# Patient Record
Sex: Female | Born: 1950
Health system: Southern US, Community
[De-identification: ages and names within clinical notes are randomized; demographics above are authoritative.]

## PROBLEM LIST (undated history)

## (undated) DIAGNOSIS — C801 Malignant (primary) neoplasm, unspecified: Secondary | ICD-10-CM

## (undated) DIAGNOSIS — E039 Hypothyroidism, unspecified: Secondary | ICD-10-CM

## (undated) DIAGNOSIS — I10 Essential (primary) hypertension: Secondary | ICD-10-CM

## (undated) DIAGNOSIS — E785 Hyperlipidemia, unspecified: Secondary | ICD-10-CM

## (undated) DIAGNOSIS — H269 Unspecified cataract: Secondary | ICD-10-CM

## (undated) DIAGNOSIS — C3490 Malignant neoplasm of unspecified part of unspecified bronchus or lung: Secondary | ICD-10-CM

## (undated) DIAGNOSIS — E119 Type 2 diabetes mellitus without complications: Secondary | ICD-10-CM

## (undated) HISTORY — DX: Malignant neoplasm of unspecified part of unspecified bronchus or lung: C34.90

## (undated) HISTORY — DX: Hyperlipidemia, unspecified: E78.5

## (undated) HISTORY — DX: Essential (primary) hypertension: I10

## (undated) HISTORY — DX: Unspecified cataract: H26.9

## (undated) HISTORY — PX: ABDOMINAL HYSTERECTOMY: SHX81

## (undated) MED FILL — Dexamethasone Sodium Phosphate Inj 100 MG/10ML: INTRAMUSCULAR | Qty: 1 | Status: AC

---

## 2003-11-17 LAB — HM DEXA SCAN

## 2005-01-28 ENCOUNTER — Ambulatory Visit: Payer: Self-pay | Admitting: Family Medicine

## 2006-12-12 ENCOUNTER — Ambulatory Visit: Payer: Self-pay | Admitting: Family Medicine

## 2014-08-28 ENCOUNTER — Ambulatory Visit: Payer: Self-pay | Admitting: Family Medicine

## 2014-08-28 LAB — HM MAMMOGRAPHY

## 2015-02-06 ENCOUNTER — Ambulatory Visit: Payer: Self-pay | Admitting: Gastroenterology

## 2015-02-06 LAB — HM COLONOSCOPY: HM Colonoscopy: 4

## 2015-03-09 LAB — TSH: TSH: 0.87 u[IU]/mL (ref 0.41–5.90)

## 2015-03-09 LAB — BASIC METABOLIC PANEL
BUN: 11 mg/dL (ref 4–21)
Creatinine: 0.7 mg/dL (ref 0.5–1.1)
GLUCOSE: 125 mg/dL
Potassium: 4 mmol/L (ref 3.4–5.3)
SODIUM: 141 mmol/L (ref 137–147)

## 2015-03-09 LAB — LIPID PANEL
Cholesterol: 192 mg/dL (ref 0–200)
HDL: 57 mg/dL (ref 35–70)
LDL Cholesterol: 117 mg/dL
Triglycerides: 90 mg/dL (ref 40–160)

## 2015-03-09 LAB — HEPATIC FUNCTION PANEL
ALK PHOS: 57 U/L (ref 25–125)
ALT: 16 U/L (ref 7–35)
AST: 14 U/L (ref 13–35)
BILIRUBIN, TOTAL: 0.5 mg/dL

## 2015-03-12 ENCOUNTER — Ambulatory Visit
Admit: 2015-03-12 | Disposition: A | Discharge status: Home / Self Care | Payer: Self-pay | Attending: Family Medicine | Admitting: Family Medicine

## 2015-04-17 ENCOUNTER — Other Ambulatory Visit: Payer: Self-pay | Admitting: Family Medicine

## 2015-04-17 DIAGNOSIS — I10 Essential (primary) hypertension: Secondary | ICD-10-CM

## 2015-06-15 ENCOUNTER — Ambulatory Visit: Payer: Self-pay | Admitting: Family Medicine

## 2015-08-10 ENCOUNTER — Other Ambulatory Visit: Payer: Self-pay | Admitting: Family Medicine

## 2015-08-10 DIAGNOSIS — J309 Allergic rhinitis, unspecified: Secondary | ICD-10-CM

## 2015-12-10 ENCOUNTER — Other Ambulatory Visit: Payer: Self-pay | Admitting: Family Medicine

## 2015-12-10 DIAGNOSIS — I10 Essential (primary) hypertension: Secondary | ICD-10-CM

## 2015-12-10 DIAGNOSIS — IMO0002 Reserved for concepts with insufficient information to code with codable children: Secondary | ICD-10-CM | POA: Insufficient documentation

## 2015-12-10 MED ORDER — TRIAMTERENE-HCTZ 37.5-25 MG PO CAPS: ORAL_CAPSULE | ORAL | Status: DC

## 2015-12-10 NOTE — Telephone Encounter (Signed)
Pt contacted office for refill request on the following medications:  triamterene-hydrochlorothiazide (DYAZIDE) 37.5-25 MG per capsule.  [facility]  [phone]

## 2015-12-10 NOTE — Telephone Encounter (Signed)
Last ov 03/06/15.  sd

## 2016-01-08 ENCOUNTER — Other Ambulatory Visit: Payer: Self-pay | Admitting: Family Medicine

## 2016-01-08 DIAGNOSIS — E782 Mixed hyperlipidemia: Secondary | ICD-10-CM | POA: Diagnosis not present

## 2016-01-08 DIAGNOSIS — E7800 Pure hypercholesterolemia, unspecified: Secondary | ICD-10-CM | POA: Insufficient documentation

## 2016-01-08 DIAGNOSIS — I10 Essential (primary) hypertension: Secondary | ICD-10-CM | POA: Diagnosis not present

## 2016-01-08 DIAGNOSIS — Z6836 Body mass index (BMI) 36.0-36.9, adult: Secondary | ICD-10-CM | POA: Diagnosis not present

## 2016-01-08 DIAGNOSIS — Z0000 Encounter for general adult medical examination without abnormal findings: Secondary | ICD-10-CM | POA: Diagnosis not present

## 2016-01-08 DIAGNOSIS — E1169 Type 2 diabetes mellitus with other specified complication: Secondary | ICD-10-CM | POA: Diagnosis not present

## 2016-01-08 MED ORDER — SIMVASTATIN 20 MG PO TABS
20.0000 mg | ORAL_TABLET | Freq: Every day | ORAL | Status: DC
Start: 2016-01-08 — End: 2016-05-06

## 2016-01-08 NOTE — Telephone Encounter (Signed)
Pt contacted office for refill request on the following medications: Simvastatin 20 mg to [employer] [facility]. Pt stated that she doesn't want to use her mail order any more. Thanks TNP

## 2016-01-08 NOTE — Telephone Encounter (Signed)
Lipids checked 03/09/2015. Renaldo Fiddler, CMA

## 2016-01-20 DIAGNOSIS — E669 Obesity, unspecified: Secondary | ICD-10-CM | POA: Insufficient documentation

## 2016-01-20 DIAGNOSIS — E119 Type 2 diabetes mellitus without complications: Secondary | ICD-10-CM | POA: Insufficient documentation

## 2016-01-20 DIAGNOSIS — K5790 Diverticulosis of intestine, part unspecified, without perforation or abscess without bleeding: Secondary | ICD-10-CM | POA: Insufficient documentation

## 2016-01-20 DIAGNOSIS — E010 Iodine-deficiency related diffuse (endemic) goiter: Secondary | ICD-10-CM | POA: Insufficient documentation

## 2016-02-23 ENCOUNTER — Ambulatory Visit (INDEPENDENT_AMBULATORY_CARE_PROVIDER_SITE_OTHER): Payer: Commercial Managed Care - HMO | Admitting: Family Medicine

## 2016-02-23 ENCOUNTER — Encounter: Payer: Self-pay | Admitting: Family Medicine

## 2016-02-23 VITALS — BP 126/70 | HR 77 | Temp 98.1°F | Resp 16 | Ht 65.0 in | Wt 229.0 lb

## 2016-02-23 DIAGNOSIS — Z0000 Encounter for general adult medical examination without abnormal findings: Secondary | ICD-10-CM | POA: Diagnosis not present

## 2016-02-23 DIAGNOSIS — Z1239 Encounter for other screening for malignant neoplasm of breast: Secondary | ICD-10-CM | POA: Diagnosis not present

## 2016-02-23 DIAGNOSIS — Z23 Encounter for immunization: Secondary | ICD-10-CM

## 2016-02-23 NOTE — Patient Instructions (Signed)
Please call the [facility] at [facility] to schedule this at [phone]   

## 2016-02-23 NOTE — Progress Notes (Signed)
Patient ID: Doris Johnson, female   DOB: 1951-08-01, 65 y.o.   MRN: [id number]       Patient: Doris Johnson, Female    DOB: July 20, 1951, 4 y.o.   MRN: [id number] Visit Date: 02/23/2016  Today's Provider: Margarita Rana, MD   Chief Complaint  Patient presents with  . Medicare Wellness   Subjective:    Annual wellness visit Doris Johnson is a 65 y.o. female. She feels well. She reports exercising none. She reports she is sleeping well.  Is going to look into a living will.   Here for her [facility] evaluation.    03/06/15 CPE 08/28/14 Mammogram-BI-RADS 1 02/06/15 Colonoscopy-polyp, recheck in 5 years -----------------------------------------------------------   Review of Systems  Constitutional: Negative.   HENT: Negative.   Eyes: Negative.   Respiratory: Negative.   Cardiovascular: Negative.   Gastrointestinal: Negative.   Endocrine: Negative.   Genitourinary: Positive for vaginal discharge.  Musculoskeletal: Negative.   Allergic/Immunologic: Negative.   Neurological: Negative.   Hematological: Negative.   Psychiatric/Behavioral: Negative.     Social History   Social History  . Marital Status: Single    Spouse Name: N/A  . Number of Children: N/A  . Years of Education: N/A   Occupational History  . Not on file.   Social History Main Topics  . Smoking status: Never Smoker   . Smokeless tobacco: Never Used  . Alcohol Use: No     Comment: occasional  . Drug Use: No  . Sexual Activity: Not on file   Other Topics Concern  . Not on file   Social History Narrative    Past Medical History  Diagnosis Date  . Hypertension   . Hyperlipidemia      Patient Active Problem List   Diagnosis Date Noted  . Diabetes mellitus, type 2 ([employer]) 01/20/2016  . DD (diverticular disease) 01/20/2016  . Big thyroid 01/20/2016  . Adiposity 01/20/2016  . Hypercholesteremia 01/08/2016  . Adult BMI 30+ 12/10/2015  . BP (high blood pressure) 12/10/2015  .  Allergic rhinitis 08/10/2015    Past Surgical History  Procedure Laterality Date  . Abdominal hysterectomy      Her family history includes Cancer in her mother; Heart disease in her brother and father; Hyperlipidemia in her brother; Hypertension in her mother.    Previous Medications   ASPIRIN 81 MG TABLET    Take 81 mg by mouth daily.    CETIRIZINE (ZYRTEC) 10 MG TABLET    TAKE 1 TABLET BY MOUTH EVERY DAY   FLUTICASONE (FLONASE) 50 MCG/ACT NASAL SPRAY    USE 2 SPRAYS IN EACH NOSTRIL EVERY DAY   LISINOPRIL (PRINIVIL,ZESTRIL) 20 MG TABLET    Take 20 mg by mouth daily.    METFORMIN (GLUCOPHAGE) 500 MG TABLET    Take 500 mg by mouth daily with breakfast.    SIMVASTATIN (ZOCOR) 20 MG TABLET    Take 1 tablet (20 mg total) by mouth at bedtime.   TRIAMTERENE-HYDROCHLOROTHIAZIDE (DYAZIDE) 37.5-25 MG CAPSULE    TAKE 1 CAPSULE CAPSULE BY MOUTH DAILY    Patient Care Team: Margarita Rana, MD as PCP - General (Family Medicine)     Objective:   Vitals: BP 126/70 mmHg  Pulse 77  Temp(Src) 98.1 F (36.7 C) (Oral)  Resp 16  Ht '5\' 5"'$  (1.651 m)  Wt 229 lb (103.874 kg)  BMI 38.11 kg/m2  SpO2 97%  Physical Exam  Constitutional: She is oriented to person, place, and time. She appears  well-developed and well-nourished.  Neck: Trachea normal. Carotid bruit is not present. No thyroid mass present.  Cardiovascular: Normal rate, regular rhythm and normal heart sounds.   Pulmonary/Chest: Effort normal and breath sounds normal.  Abdominal: There is no hepatosplenomegaly.  Neurological: She is alert and oriented to person, place, and time. She has normal strength. No cranial nerve deficit.  Skin: Skin is intact.  Psychiatric: She has a normal mood and affect. Her speech is normal and behavior is normal. Judgment and thought content normal. Cognition and memory are normal.    Activities of Daily Living In your present state of health, do you have any difficulty performing the following activities:  02/23/2016  Hearing? N  Vision? N  Difficulty concentrating or making decisions? N  Walking or climbing stairs? N  Dressing or bathing? N  Doing errands, shopping? N    Fall Risk Assessment Fall Risk  02/23/2016  Falls in the past year? No     Depression Screen PHQ 2/9 Scores 02/23/2016  PHQ - 2 Score 0    Cognitive Testing - 6-CIT  Correct? Score   What year is it? yes 0 0 or 4  What month is it? yes 0 0 or 3  Memorize:    Doris Johnson,  41,  [address],  [place],      What time is it? (within 1 hour) yes 0 0 or 3  Count backwards from 20 yes 0 0, 2, or 4  Name the months of the year yes 0 0, 2, or 4  Repeat name & address above no 2 0, 2, 4, 6, 8, or 10       TOTAL SCORE  2/28   Interpretation:  Normal  Normal (0-7) Abnormal (8-28)       Assessment & Plan:     Annual Wellness Visit  Reviewed patient's Family Medical History Reviewed and updated list of patient's medical providers Assessment of cognitive impairment was done Assessed patient's functional ability Established a written schedule for health screening [place] Completed and Reviewed  Exercise Activities and Dietary recommendations Goals    . Exercise 150 minutes per week (moderate activity)       Immunization History  Administered Date(s) Administered  . Pneumococcal Conjugate-13 09/25/2013  . Pneumococcal Polysaccharide-23 02/23/2016  . Tdap 03/06/2015       1. Medicare annual wellness visit, initial As above. Stable. Patient advised to continue eating healthy and exercise daily. - EKG 12-Lead  2. Breast cancer screening - MM DIGITAL SCREENING BILATERAL; Future  3. Need for pneumococcal vaccination - Pneumococcal polysaccharide vaccine 23-valent greater than or equal to 2yo subcutaneous/IM     Patient seen and examined by Dr. Jerrell Belfast, and note scribed by Philbert Riser. Dimas, CMA.  I have reviewed the document for accuracy and completeness and I agree  with above. Jerrell Belfast, MD   Margarita Rana, MD   ------------------------------------------------------------------------------------------------------------

## 2016-02-29 ENCOUNTER — Encounter: Payer: Self-pay | Admitting: Family Medicine

## 2016-03-10 DIAGNOSIS — H5203 Hypermetropia, bilateral: Secondary | ICD-10-CM | POA: Diagnosis not present

## 2016-03-10 DIAGNOSIS — H5210 Myopia, unspecified eye: Secondary | ICD-10-CM | POA: Diagnosis not present

## 2016-03-10 DIAGNOSIS — E119 Type 2 diabetes mellitus without complications: Secondary | ICD-10-CM | POA: Diagnosis not present

## 2016-03-10 DIAGNOSIS — H43313 Vitreous membranes and strands, bilateral: Secondary | ICD-10-CM | POA: Diagnosis not present

## 2016-03-11 ENCOUNTER — Ambulatory Visit: Payer: Self-pay

## 2016-03-17 ENCOUNTER — Other Ambulatory Visit: Payer: Self-pay | Admitting: Internal Medicine

## 2016-03-17 DIAGNOSIS — Z1231 Encounter for screening mammogram for malignant neoplasm of breast: Secondary | ICD-10-CM

## 2016-03-17 DIAGNOSIS — E785 Hyperlipidemia, unspecified: Secondary | ICD-10-CM | POA: Diagnosis not present

## 2016-03-17 DIAGNOSIS — E119 Type 2 diabetes mellitus without complications: Secondary | ICD-10-CM | POA: Diagnosis not present

## 2016-03-17 DIAGNOSIS — I10 Essential (primary) hypertension: Secondary | ICD-10-CM | POA: Diagnosis not present

## 2016-03-24 ENCOUNTER — Ambulatory Visit: Payer: Commercial Managed Care - HMO | Admitting: Family Medicine

## 2016-03-28 ENCOUNTER — Ambulatory Visit
Admission: RE | Admit: 2016-03-28 | Discharge: 2016-03-28 | Disposition: A | Discharge status: Home / Self Care | Payer: Commercial Managed Care - HMO | Source: Ambulatory Visit | Attending: Internal Medicine | Admitting: Internal Medicine

## 2016-03-28 ENCOUNTER — Other Ambulatory Visit: Payer: Self-pay | Admitting: Internal Medicine

## 2016-03-28 DIAGNOSIS — Z1231 Encounter for screening mammogram for malignant neoplasm of breast: Secondary | ICD-10-CM

## 2016-03-31 ENCOUNTER — Other Ambulatory Visit: Payer: Self-pay | Admitting: Family Medicine

## 2016-03-31 DIAGNOSIS — I10 Essential (primary) hypertension: Secondary | ICD-10-CM

## 2016-04-05 DIAGNOSIS — Z0100 Encounter for examination of eyes and vision without abnormal findings: Secondary | ICD-10-CM | POA: Diagnosis not present

## 2016-04-13 ENCOUNTER — Other Ambulatory Visit: Payer: Self-pay | Admitting: Family Medicine

## 2016-04-13 DIAGNOSIS — J309 Allergic rhinitis, unspecified: Secondary | ICD-10-CM

## 2016-05-06 ENCOUNTER — Other Ambulatory Visit: Payer: Self-pay | Admitting: Family Medicine

## 2016-05-06 DIAGNOSIS — E7800 Pure hypercholesterolemia, unspecified: Secondary | ICD-10-CM

## 2016-07-07 ENCOUNTER — Other Ambulatory Visit: Payer: Self-pay | Admitting: Physician Assistant

## 2016-07-07 DIAGNOSIS — I10 Essential (primary) hypertension: Secondary | ICD-10-CM

## 2016-07-07 NOTE — Telephone Encounter (Signed)
Had wellness on 02/23/2016. Does not have FU scheduled. Renaldo Fiddler, CMA

## 2016-12-07 ENCOUNTER — Other Ambulatory Visit: Payer: Self-pay | Admitting: Family Medicine

## 2016-12-07 DIAGNOSIS — I10 Essential (primary) hypertension: Secondary | ICD-10-CM

## 2017-04-18 ENCOUNTER — Other Ambulatory Visit: Payer: Self-pay | Admitting: Physician Assistant

## 2017-04-18 DIAGNOSIS — I10 Essential (primary) hypertension: Secondary | ICD-10-CM

## 2017-04-25 ENCOUNTER — Telehealth: Payer: Self-pay | Admitting: Physician Assistant

## 2017-04-25 DIAGNOSIS — J301 Allergic rhinitis due to pollen: Secondary | ICD-10-CM

## 2017-04-25 NOTE — Telephone Encounter (Signed)
Last ov 02/23/16 Last filled 04/13/16 Please review. Thank you. sd

## 2017-04-25 NOTE — Telephone Encounter (Signed)
[facility] faxed a refill request on the following medications:  fluticasone (FLONASE) 50 MCG/ACT nasal spray.  Shake liquid and use 2 sprays in each nostrils every day.  [facility]

## 2017-04-26 MED ORDER — FLUTICASONE PROPIONATE 50 MCG/ACT NA SUSP
2.0000 | Freq: Every day | NASAL | 5 refills | Status: AC
Start: 2017-04-26 — End: ?

## 2017-04-26 NOTE — Telephone Encounter (Signed)
Flonase refilled

## 2017-05-15 ENCOUNTER — Other Ambulatory Visit: Payer: Self-pay | Admitting: Physician Assistant

## 2017-05-15 DIAGNOSIS — I10 Essential (primary) hypertension: Secondary | ICD-10-CM

## 2017-07-01 ENCOUNTER — Other Ambulatory Visit: Payer: Self-pay | Admitting: Physician Assistant

## 2017-07-01 DIAGNOSIS — I10 Essential (primary) hypertension: Secondary | ICD-10-CM

## 2017-07-13 ENCOUNTER — Other Ambulatory Visit: Payer: Self-pay | Admitting: Physician Assistant

## 2017-07-13 DIAGNOSIS — E7800 Pure hypercholesterolemia, unspecified: Secondary | ICD-10-CM

## 2017-07-13 MED ORDER — SIMVASTATIN 20 MG PO TABS: ORAL_TABLET | ORAL | 0 refills | Status: DC

## 2017-07-13 NOTE — Telephone Encounter (Signed)
Last OV 02/23/16 with Dr. Venia Minks. Last RF 05/06/16

## 2017-07-13 NOTE — Telephone Encounter (Signed)
[facility] pharmacy faxed a request on the following medication. Thanks CC  simvastatin (ZOCOR) 20 MG tablet  >Take 1 tablet ( 20 MG ) by mouth at bedtime

## 2017-08-19 ENCOUNTER — Other Ambulatory Visit: Payer: Self-pay | Admitting: Physician Assistant

## 2017-08-19 DIAGNOSIS — E7800 Pure hypercholesterolemia, unspecified: Secondary | ICD-10-CM

## 2017-11-06 ENCOUNTER — Other Ambulatory Visit: Payer: Self-pay | Admitting: Family Medicine

## 2017-11-06 DIAGNOSIS — I10 Essential (primary) hypertension: Secondary | ICD-10-CM

## 2018-01-20 ENCOUNTER — Other Ambulatory Visit: Payer: Self-pay | Admitting: Family Medicine

## 2018-01-20 ENCOUNTER — Other Ambulatory Visit: Payer: Self-pay | Admitting: Physician Assistant

## 2018-01-20 DIAGNOSIS — I10 Essential (primary) hypertension: Secondary | ICD-10-CM

## 2018-01-20 DIAGNOSIS — J301 Allergic rhinitis due to pollen: Secondary | ICD-10-CM

## 2018-04-19 ENCOUNTER — Other Ambulatory Visit: Payer: Self-pay | Admitting: Internal Medicine

## 2018-04-19 DIAGNOSIS — Z1231 Encounter for screening mammogram for malignant neoplasm of breast: Secondary | ICD-10-CM

## 2018-06-14 ENCOUNTER — Ambulatory Visit
Admission: RE | Admit: 2018-06-14 | Discharge: 2018-06-14 | Disposition: A | Discharge status: Home / Self Care | Payer: Medicare PPO | Source: Ambulatory Visit | Attending: Internal Medicine | Admitting: Internal Medicine

## 2018-06-14 DIAGNOSIS — Z1231 Encounter for screening mammogram for malignant neoplasm of breast: Secondary | ICD-10-CM | POA: Insufficient documentation

## 2018-06-29 ENCOUNTER — Other Ambulatory Visit: Payer: Self-pay | Admitting: Family Medicine

## 2018-06-29 DIAGNOSIS — I10 Essential (primary) hypertension: Secondary | ICD-10-CM

## 2018-06-29 NOTE — Telephone Encounter (Signed)
This is a patient of Dr. Sharyon Medicus and has not been seen in over 2 years. She needs to schedule an appointment to establish with Dr. B before we can approve any refills.

## 2019-07-19 ENCOUNTER — Other Ambulatory Visit: Payer: Self-pay | Admitting: Internal Medicine

## 2019-07-19 DIAGNOSIS — Z1231 Encounter for screening mammogram for malignant neoplasm of breast: Secondary | ICD-10-CM

## 2019-08-06 ENCOUNTER — Ambulatory Visit
Admission: RE | Admit: 2019-08-06 | Discharge: 2019-08-06 | Disposition: A | Discharge status: Home / Self Care | Payer: Medicare Other | Source: Ambulatory Visit | Attending: Internal Medicine | Admitting: Internal Medicine

## 2019-08-06 ENCOUNTER — Other Ambulatory Visit: Payer: Self-pay

## 2019-08-06 DIAGNOSIS — Z1231 Encounter for screening mammogram for malignant neoplasm of breast: Secondary | ICD-10-CM | POA: Diagnosis present

## 2020-09-02 DIAGNOSIS — K5900 Constipation, unspecified: Secondary | ICD-10-CM | POA: Insufficient documentation

## 2020-09-02 DIAGNOSIS — R634 Abnormal weight loss: Secondary | ICD-10-CM | POA: Insufficient documentation

## 2020-09-03 ENCOUNTER — Ambulatory Visit
Admission: RE | Admit: 2020-09-03 | Discharge: 2020-09-03 | Disposition: A | Discharge status: Home / Self Care | Payer: Medicare HMO | Source: Ambulatory Visit | Attending: Infectious Diseases | Admitting: Infectious Diseases

## 2020-09-03 ENCOUNTER — Other Ambulatory Visit: Payer: Self-pay | Admitting: Infectious Diseases

## 2020-09-03 ENCOUNTER — Other Ambulatory Visit: Payer: Self-pay

## 2020-09-03 DIAGNOSIS — R918 Other nonspecific abnormal finding of lung field: Secondary | ICD-10-CM

## 2020-09-03 HISTORY — DX: Type 2 diabetes mellitus without complications: E11.9

## 2020-09-03 MED ORDER — IOHEXOL 300 MG/ML  SOLN
75.0000 mL | Freq: Once | INTRAMUSCULAR | Status: AC | PRN
  Administered 2020-09-03: 75 mL via INTRAVENOUS

## 2020-09-23 ENCOUNTER — Other Ambulatory Visit: Payer: Self-pay | Admitting: Pulmonary Disease

## 2020-09-23 DIAGNOSIS — R918 Other nonspecific abnormal finding of lung field: Secondary | ICD-10-CM

## 2020-09-28 ENCOUNTER — Other Ambulatory Visit
Admission: RE | Admit: 2020-09-28 | Discharge: 2020-09-28 | Disposition: A | Discharge status: Home / Self Care | Payer: Medicare HMO | Source: Ambulatory Visit | Attending: Pulmonary Disease | Admitting: Pulmonary Disease

## 2020-09-28 ENCOUNTER — Other Ambulatory Visit: Payer: Medicare HMO

## 2020-09-28 ENCOUNTER — Other Ambulatory Visit: Payer: Self-pay

## 2020-09-28 HISTORY — DX: Hypothyroidism, unspecified: E03.9

## 2020-09-28 NOTE — Patient Instructions (Signed)
Your procedure is scheduled on: Monday October 05, 2020. Report to Day Surgery inside [facility] 2nd floor. To find out your arrival time please call [phone] between 1PM - 3PM on Friday October 02, 2020.  Remember: Instructions that are not followed completely may result in serious medical risk,  up to and including death, or upon the discretion of your surgeon and anesthesiologist your  surgery may need to be rescheduled.     _X__ 1. Do not eat food after midnight the night before your procedure.                 No chewing gum or hard candies. You may drink clear liquids up to 2 hours                 before you are scheduled to arrive for your surgery- DO not drink clear                 liquids within 2 hours of the start of your surgery.                 Clear Liquids include:  water, apple juice without pulp, clear Gatorade, G2 or                  Gatorade Zero (avoid Red/Purple/Blue), Black Coffee or Tea (Do not add                 anything to coffee or tea).  __X__2.  On the morning of surgery brush your teeth with toothpaste and water, you                may rinse your mouth with mouthwash if you wish.  Do not swallow any toothpaste of mouthwash.     _X__ 3.  No Alcohol for 24 hours before or after surgery.   _X__ 4.  Do Not Smoke or use e-cigarettes For 24 Hours Prior to Your Surgery.                 Do not use any chewable tobacco products for at least 6 hours prior to                 Surgery.  _X__  5.  Do not use any recreational drugs (marijuana, cocaine, heroin, ecstasy, MDMA or other)                For at least one week prior to your surgery.  Combination of these drugs with anesthesia                May have life threatening results.  __X__ 6.  Notify your doctor if there is any change in your medical condition      (cold, fever, infections).     Do not wear jewelry, make-up, hairpins, clips or nail polish. Do not wear lotions, powders,  or perfumes. . Do not shave 48 hours prior to surgery. Men may shave face and neck. Do not bring valuables to the hospital.    [facility] is not responsible for any belongings or valuables.  Contacts, dentures or bridgework may not be worn into surgery. Leave your suitcase in the car. After surgery it may be brought to your room. For patients admitted to the hospital, discharge time is determined by your treatment team.   Patients discharged the day of surgery will not be allowed to drive home.   Make arrangements for someone to be with you for  the first 24 hours of your Same Day Discharge.    __X__ Take these medicines the morning of surgery with A SIP OF WATER:    1. None     ____ Fleet Enema (as directed)   ____ Use CHG Soap (or wipes) as directed  ____ Use Benzoyl Peroxide Gel as instructed  __X__ Use inhalers on the day of surgery  fluticasone (FLONASE) 50 MCG/ACT nasal spray   __X__ Stop metformin 2 days prior to surgery (last dose will be Friday October 02, 2020)    ____ Take 1/2 of usual insulin dose the night before surgery. No insulin the morning          of surgery.   __X__ Stop aspirin 325 MG (last dose will be Tuesday November 16)  __X__ Stop Anti-inflammatories such as Ibuprofen, Aleve, Advil, naproxen and or BC powders.    __X__ Stop supplements until after surgery.    __X__ Do not start any herbal supplements before your procedure.     If you have any questions regarding your pre-procedure instructions,  Please call Pre-admit Testing at [phone].

## 2020-09-29 ENCOUNTER — Other Ambulatory Visit: Payer: Self-pay

## 2020-09-29 ENCOUNTER — Ambulatory Visit
Admission: RE | Admit: 2020-09-29 | Discharge: 2020-09-29 | Disposition: A | Discharge status: Home / Self Care | Payer: Medicare HMO | Source: Ambulatory Visit | Attending: Pulmonary Disease | Admitting: Pulmonary Disease

## 2020-09-29 ENCOUNTER — Encounter: Payer: Self-pay | Admitting: Urgent Care

## 2020-09-29 ENCOUNTER — Encounter
Admission: RE | Admit: 2020-09-29 | Discharge: 2020-09-29 | Disposition: A | Discharge status: Home / Self Care | Payer: Medicare HMO | Source: Ambulatory Visit | Attending: Pulmonary Disease | Admitting: Pulmonary Disease

## 2020-09-29 DIAGNOSIS — I10 Essential (primary) hypertension: Secondary | ICD-10-CM | POA: Insufficient documentation

## 2020-09-29 DIAGNOSIS — R918 Other nonspecific abnormal finding of lung field: Secondary | ICD-10-CM | POA: Diagnosis not present

## 2020-09-29 DIAGNOSIS — Z01818 Encounter for other preprocedural examination: Secondary | ICD-10-CM | POA: Diagnosis present

## 2020-09-29 DIAGNOSIS — I313 Pericardial effusion (noninflammatory): Secondary | ICD-10-CM | POA: Diagnosis not present

## 2020-09-29 DIAGNOSIS — R222 Localized swelling, mass and lump, trunk: Secondary | ICD-10-CM | POA: Diagnosis not present

## 2020-09-29 DIAGNOSIS — E049 Nontoxic goiter, unspecified: Secondary | ICD-10-CM | POA: Insufficient documentation

## 2020-09-29 LAB — CBC
HCT: 40.1 % (ref 36.0–46.0)
Hemoglobin: 13.6 g/dL (ref 12.0–15.0)
MCH: 29.8 pg (ref 26.0–34.0)
MCHC: 33.9 g/dL (ref 30.0–36.0)
MCV: 87.9 fL (ref 80.0–100.0)
Platelets: 286 10*3/uL (ref 150–400)
RBC: 4.56 MIL/uL (ref 3.87–5.11)
RDW: 13.6 % (ref 11.5–15.5)
WBC: 3.2 10*3/uL — ABNORMAL LOW (ref 4.0–10.5)
nRBC: 0 % (ref 0.0–0.2)

## 2020-09-29 LAB — APTT: aPTT: 27 seconds (ref 24–36)

## 2020-09-29 LAB — PROTIME-INR
INR: 1 (ref 0.8–1.2)
Prothrombin Time: 13 seconds (ref 11.4–15.2)

## 2020-10-01 ENCOUNTER — Other Ambulatory Visit
Admission: RE | Admit: 2020-10-01 | Discharge: 2020-10-01 | Disposition: A | Discharge status: Home / Self Care | Payer: Medicare HMO | Source: Ambulatory Visit | Attending: Pulmonary Disease | Admitting: Pulmonary Disease

## 2020-10-01 ENCOUNTER — Other Ambulatory Visit: Payer: Self-pay

## 2020-10-01 DIAGNOSIS — Z20822 Contact with and (suspected) exposure to covid-19: Secondary | ICD-10-CM | POA: Insufficient documentation

## 2020-10-01 DIAGNOSIS — Z01812 Encounter for preprocedural laboratory examination: Secondary | ICD-10-CM | POA: Insufficient documentation

## 2020-10-02 LAB — SARS CORONAVIRUS 2 (TAT 6-24 HRS): SARS Coronavirus 2: NEGATIVE

## 2020-10-04 MED ORDER — ORAL CARE MOUTH RINSE: 15.0000 mL | Freq: Once | OROMUCOSAL | Status: DC

## 2020-10-04 MED ORDER — CHLORHEXIDINE GLUCONATE 0.12 % MT SOLN: 15.0000 mL | Freq: Once | OROMUCOSAL | Status: DC

## 2020-10-04 MED ORDER — SODIUM CHLORIDE 0.9 % IV SOLN: INTRAVENOUS | Status: DC

## 2020-10-04 MED ORDER — FAMOTIDINE 20 MG PO TABS: 20.0000 mg | ORAL_TABLET | Freq: Once | ORAL | Status: DC

## 2020-10-05 ENCOUNTER — Encounter: Payer: Self-pay | Admitting: *Deleted

## 2020-10-05 ENCOUNTER — Ambulatory Visit
Admission: RE | Admit: 2020-10-05 | Discharge: 2020-10-05 | Disposition: A | Discharge status: Home / Self Care | Payer: Medicare HMO | Attending: Pulmonary Disease | Admitting: Pulmonary Disease

## 2020-10-05 ENCOUNTER — Other Ambulatory Visit: Payer: Medicare HMO

## 2020-10-05 ENCOUNTER — Other Ambulatory Visit: Payer: Self-pay

## 2020-10-05 ENCOUNTER — Encounter
Admission: RE | Disposition: A | Discharge status: Home / Self Care | Payer: Self-pay | Source: Home / Self Care | Attending: Pulmonary Disease

## 2020-10-05 DIAGNOSIS — Z539 Procedure and treatment not carried out, unspecified reason: Secondary | ICD-10-CM | POA: Diagnosis present

## 2020-10-05 LAB — GLUCOSE, CAPILLARY: Glucose-Capillary: 103 mg/dL — ABNORMAL HIGH (ref 70–99)

## 2020-10-05 SURGERY — VIDEO BRONCHOSCOPY WITH ENDOBRONCHIAL NAVIGATION
Anesthesia: General

## 2020-10-05 MED ORDER — ROCURONIUM BROMIDE 10 MG/ML (PF) SYRINGE
PREFILLED_SYRINGE | INTRAVENOUS | Status: AC
  Filled 2020-10-05: qty 10

## 2020-10-05 MED ORDER — PROPOFOL 10 MG/ML IV BOLUS
INTRAVENOUS | Status: AC
  Filled 2020-10-05: qty 20

## 2020-10-05 MED ORDER — CHLORHEXIDINE GLUCONATE 0.12 % MT SOLN
OROMUCOSAL | Status: AC
  Filled 2020-10-05: qty 15

## 2020-10-05 MED ORDER — FENTANYL CITRATE (PF) 100 MCG/2ML IJ SOLN
INTRAMUSCULAR | Status: AC
  Filled 2020-10-05: qty 2

## 2020-10-05 MED ORDER — FAMOTIDINE 20 MG PO TABS
ORAL_TABLET | ORAL | Status: AC
  Filled 2020-10-05: qty 1

## 2020-10-05 MED ORDER — LIDOCAINE HCL (PF) 2 % IJ SOLN
INTRAMUSCULAR | Status: AC
  Filled 2020-10-05: qty 5

## 2020-10-09 NOTE — H&P (Signed)
Patient was re-scheduled for biopsy.

## 2020-10-19 ENCOUNTER — Other Ambulatory Visit
Admission: RE | Admit: 2020-10-19 | Discharge: 2020-10-19 | Disposition: A | Discharge status: Home / Self Care | Payer: Medicare HMO | Source: Ambulatory Visit | Attending: Pulmonary Disease | Admitting: Pulmonary Disease

## 2020-10-19 ENCOUNTER — Other Ambulatory Visit: Payer: Self-pay

## 2020-10-19 DIAGNOSIS — Z20822 Contact with and (suspected) exposure to covid-19: Secondary | ICD-10-CM | POA: Insufficient documentation

## 2020-10-19 DIAGNOSIS — Z01812 Encounter for preprocedural laboratory examination: Secondary | ICD-10-CM | POA: Insufficient documentation

## 2020-10-20 LAB — SARS CORONAVIRUS 2 (TAT 6-24 HRS): SARS Coronavirus 2: NEGATIVE

## 2020-10-21 ENCOUNTER — Ambulatory Visit: Payer: Medicare HMO

## 2020-10-21 ENCOUNTER — Ambulatory Visit: Payer: Medicare HMO | Admitting: Certified Registered"

## 2020-10-21 ENCOUNTER — Encounter: Payer: Self-pay | Admitting: *Deleted

## 2020-10-21 ENCOUNTER — Ambulatory Visit
Admission: RE | Admit: 2020-10-21 | Discharge: 2020-10-21 | Disposition: A | Discharge status: Home / Self Care | Payer: Medicare HMO | Attending: Pulmonary Disease | Admitting: Pulmonary Disease

## 2020-10-21 ENCOUNTER — Other Ambulatory Visit: Payer: Self-pay

## 2020-10-21 ENCOUNTER — Encounter
Admission: RE | Disposition: A | Discharge status: Home / Self Care | Payer: Self-pay | Source: Home / Self Care | Attending: Pulmonary Disease

## 2020-10-21 DIAGNOSIS — E119 Type 2 diabetes mellitus without complications: Secondary | ICD-10-CM | POA: Diagnosis not present

## 2020-10-21 DIAGNOSIS — C771 Secondary and unspecified malignant neoplasm of intrathoracic lymph nodes: Secondary | ICD-10-CM | POA: Diagnosis not present

## 2020-10-21 DIAGNOSIS — I10 Essential (primary) hypertension: Secondary | ICD-10-CM | POA: Diagnosis not present

## 2020-10-21 DIAGNOSIS — C3412 Malignant neoplasm of upper lobe, left bronchus or lung: Secondary | ICD-10-CM | POA: Insufficient documentation

## 2020-10-21 DIAGNOSIS — Z9889 Other specified postprocedural states: Secondary | ICD-10-CM

## 2020-10-21 HISTORY — PX: VIDEO BRONCHOSCOPY WITH ENDOBRONCHIAL ULTRASOUND: SHX6177

## 2020-10-21 HISTORY — PX: VIDEO BRONCHOSCOPY WITH ENDOBRONCHIAL NAVIGATION: SHX6175

## 2020-10-21 LAB — GLUCOSE, CAPILLARY
Glucose-Capillary: 92 mg/dL (ref 70–99)
Glucose-Capillary: 85 mg/dL (ref 70–99)

## 2020-10-21 LAB — POCT I-STAT, CHEM 8
BUN: 9 mg/dL (ref 8–23)
Calcium, Ion: 1.24 mmol/L (ref 1.15–1.40)
Chloride: 105 mmol/L (ref 98–111)
Creatinine, Ser: 0.5 mg/dL (ref 0.44–1.00)
Glucose, Bld: 99 mg/dL (ref 70–99)
HCT: 39 % (ref 36.0–46.0)
Hemoglobin: 13.3 g/dL (ref 12.0–15.0)
Potassium: 2.9 mmol/L — ABNORMAL LOW (ref 3.5–5.1)
Sodium: 142 mmol/L (ref 135–145)
TCO2: 25 mmol/L (ref 22–32)

## 2020-10-21 SURGERY — VIDEO BRONCHOSCOPY WITH ENDOBRONCHIAL NAVIGATION
Anesthesia: General

## 2020-10-21 MED ORDER — ROCURONIUM BROMIDE 10 MG/ML (PF) SYRINGE
PREFILLED_SYRINGE | INTRAVENOUS | Status: AC
  Filled 2020-10-21: qty 10

## 2020-10-21 MED ORDER — CHLORHEXIDINE GLUCONATE 0.12 % MT SOLN
OROMUCOSAL | Status: AC
  Filled 2020-10-21: qty 15

## 2020-10-21 MED ORDER — FAMOTIDINE 20 MG PO TABS
20.0000 mg | ORAL_TABLET | Freq: Once | ORAL | Status: AC
  Administered 2020-10-21: 20 mg via ORAL

## 2020-10-21 MED ORDER — GLYCOPYRROLATE PF 0.2 MG/ML IJ SOSY
PREFILLED_SYRINGE | INTRAMUSCULAR | Status: DC | PRN
  Administered 2020-10-21: .2 mg via INTRAVENOUS

## 2020-10-21 MED ORDER — ORAL CARE MOUTH RINSE: 15.0000 mL | Freq: Once | OROMUCOSAL | Status: AC

## 2020-10-21 MED ORDER — ONDANSETRON HCL 4 MG/2ML IJ SOLN
INTRAMUSCULAR | Status: DC | PRN
  Administered 2020-10-21: 4 mg via INTRAVENOUS

## 2020-10-21 MED ORDER — LIDOCAINE HCL URETHRAL/MUCOSAL 2 % EX GEL
1.0000 "application " | Freq: Once | CUTANEOUS | Status: DC
  Filled 2020-10-21: qty 5

## 2020-10-21 MED ORDER — PROPOFOL 500 MG/50ML IV EMUL
INTRAVENOUS | Status: DC | PRN
  Administered 2020-10-21: 120 ug/kg/min via INTRAVENOUS

## 2020-10-21 MED ORDER — SUGAMMADEX SODIUM 200 MG/2ML IV SOLN
INTRAVENOUS | Status: DC | PRN
  Administered 2020-10-21: 200 mg via INTRAVENOUS

## 2020-10-21 MED ORDER — PROPOFOL 500 MG/50ML IV EMUL
INTRAVENOUS | Status: AC
  Filled 2020-10-21: qty 50

## 2020-10-21 MED ORDER — DEXAMETHASONE SODIUM PHOSPHATE 10 MG/ML IJ SOLN
INTRAMUSCULAR | Status: DC | PRN
  Administered 2020-10-21: 8 mg via INTRAVENOUS

## 2020-10-21 MED ORDER — ROCURONIUM BROMIDE 100 MG/10ML IV SOLN
INTRAVENOUS | Status: DC | PRN
  Administered 2020-10-21: 50 mg via INTRAVENOUS

## 2020-10-21 MED ORDER — PROPOFOL 10 MG/ML IV BOLUS
INTRAVENOUS | Status: AC
  Filled 2020-10-21: qty 20

## 2020-10-21 MED ORDER — FENTANYL CITRATE (PF) 100 MCG/2ML IJ SOLN
INTRAMUSCULAR | Status: AC
  Filled 2020-10-21: qty 2

## 2020-10-21 MED ORDER — FENTANYL CITRATE (PF) 100 MCG/2ML IJ SOLN: 25.0000 ug | INTRAMUSCULAR | Status: DC | PRN

## 2020-10-21 MED ORDER — ONDANSETRON HCL 4 MG/2ML IJ SOLN: 4.0000 mg | Freq: Once | INTRAMUSCULAR | Status: DC | PRN

## 2020-10-21 MED ORDER — PHENYLEPHRINE HCL (PRESSORS) 10 MG/ML IV SOLN
INTRAVENOUS | Status: DC | PRN
  Administered 2020-10-21 (×3): 100 ug via INTRAVENOUS
  Administered 2020-10-21: 80 ug via INTRAVENOUS
  Administered 2020-10-21 (×2): 100 ug via INTRAVENOUS

## 2020-10-21 MED ORDER — LIDOCAINE HCL (PF) 1 % IJ SOLN
30.0000 mL | Freq: Once | INTRAMUSCULAR | Status: DC
  Filled 2020-10-21: qty 30

## 2020-10-21 MED ORDER — PROPOFOL 10 MG/ML IV BOLUS
INTRAVENOUS | Status: DC | PRN
  Administered 2020-10-21: 140 mg via INTRAVENOUS

## 2020-10-21 MED ORDER — BUTAMBEN-TETRACAINE-BENZOCAINE 2-2-14 % EX AERO
1.0000 | INHALATION_SPRAY | Freq: Once | CUTANEOUS | Status: DC
  Filled 2020-10-21: qty 20

## 2020-10-21 MED ORDER — SODIUM CHLORIDE 0.9 % IV SOLN: INTRAVENOUS | Status: DC

## 2020-10-21 MED ORDER — FENTANYL CITRATE (PF) 100 MCG/2ML IJ SOLN
INTRAMUSCULAR | Status: DC | PRN
  Administered 2020-10-21: 50 ug via INTRAVENOUS

## 2020-10-21 MED ORDER — CHLORHEXIDINE GLUCONATE 0.12 % MT SOLN
15.0000 mL | Freq: Once | OROMUCOSAL | Status: AC
  Administered 2020-10-21: 15 mL via OROMUCOSAL

## 2020-10-21 MED ORDER — PHENYLEPHRINE HCL 0.25 % NA SOLN
1.0000 | Freq: Four times a day (QID) | NASAL | Status: DC | PRN
  Filled 2020-10-21: qty 15

## 2020-10-21 MED ORDER — FAMOTIDINE 20 MG PO TABS
ORAL_TABLET | ORAL | Status: AC
  Filled 2020-10-21: qty 1

## 2020-10-21 NOTE — Transfer of Care (Signed)
Immediate Anesthesia Transfer of Care Note  Patient: Doris Johnson  Procedure(s) Performed: VIDEO BRONCHOSCOPY WITH ENDOBRONCHIAL NAVIGATION (N/A ) VIDEO BRONCHOSCOPY WITH ENDOBRONCHIAL ULTRASOUND (N/A )  Patient Location: PACU  Anesthesia Type:General  Level of Consciousness: awake, alert  and oriented  Airway & Oxygen Therapy: Patient Spontanous Breathing and Patient connected to face mask oxygen  Post-op Assessment: Report given to RN and Post -op Vital signs reviewed and stable  Post vital signs: Reviewed and stable  Last Vitals:  Vitals Value Taken Time  BP 139/82 10/21/20 1459  Temp 36.1 C 10/21/20 1459  Pulse 79 10/21/20 1459  Resp 20 10/21/20 1459  SpO2 100 % 10/21/20 1459    Last Pain:  Vitals:   10/21/20 1459  TempSrc:   PainSc: 0-No pain      Patients Stated Pain Goal: 0 (16/10/96 0454)  Complications: No complications documented.

## 2020-10-21 NOTE — Anesthesia Preprocedure Evaluation (Signed)
Anesthesia Evaluation  Patient identified by MRN, date of birth, ID band Patient awake    Reviewed: Allergy & Precautions, H&P , NPO status , Patient's Chart, lab work & pertinent test results, reviewed documented beta blocker date and time   Airway Mallampati: II  TM Distance: >3 FB Neck ROM: full    Dental  (+) Teeth Intact, Poor Dentition, Missing, Loose   Pulmonary neg pulmonary ROS,    Pulmonary exam normal        Cardiovascular Exercise Tolerance: Poor hypertension, On Medications negative cardio ROS Normal cardiovascular exam Rhythm:regular Rate:Normal     Neuro/Psych negative neurological ROS  negative psych ROS   GI/Hepatic negative GI ROS, Neg liver ROS,   Endo/Other  diabetes, Well Controlled, Type 2, Oral Hypoglycemic AgentsHypothyroidism   Renal/GU negative Renal ROS  negative genitourinary   Musculoskeletal   Abdominal   Peds  Hematology negative hematology ROS (+)   Anesthesia Other Findings Past Medical History: No date: Diabetes mellitus without complication (HCC) No date: Hyperlipidemia No date: Hypertension No date: Hypothyroidism     Comment:  doctor's keeping an eye on thyroid  Past Surgical History: No date: ABDOMINAL HYSTERECTOMY BMI    Body Mass Index: 25.18 kg/m     Reproductive/Obstetrics negative OB ROS                             Anesthesia Physical Anesthesia Plan  ASA: III  Anesthesia Plan: General ETT   Post-op Pain Management:    Induction:   PONV Risk Score and Plan: 4 or greater  Airway Management Planned:   Additional Equipment:   Intra-op Plan:   Post-operative Plan:   Informed Consent: I have reviewed the patients History and Physical, chart, labs and discussed the procedure including the risks, benefits and alternatives for the proposed anesthesia with the patient or authorized representative who has indicated his/her  understanding and acceptance.     Dental Advisory Given  Plan Discussed with: CRNA  Anesthesia Plan Comments:         Anesthesia Quick Evaluation

## 2020-10-21 NOTE — H&P (Signed)
Pulmonary Medicine          Date: 10/21/2020,   MRN# [id number] Doris Johnson 1951-02-21     AdmissionWeight: 70.8 kg                 CurrentWeight: 70.8 kg   Referring physician: Dr. Raul Del   CHIEF COMPLAINT:   Left lung mass with mediastinal lymphadenopathy   HISTORY OF PRESENT ILLNESS   This is a pleasant 69 year old female with history of essential hypertension type 2 diabetes hypothyroidism, chronic constipation, dyslipidemia, who was seen by Dr. Raul Del in [facility] pulmonary on outpatient basis.  She complained of dyspnea and had chest imaging done.  She also had PFTs done which showed significant decrement in her respiratory function.  Following this patient had CT imaging with findings of left lung mass as well as mediastinal mass concerning for possible malignancy.  Patient was seen by primary pulmonologist was recommended to have biopsy for definitive tissue diagnosis.  We discussed electromagnetic navigational bronchoscopy as well as endobronchial ultrasound assisted lymph node biopsies.   PAST MEDICAL HISTORY   Past Medical History:  Diagnosis Date  . Diabetes mellitus without complication ([place])   . Hyperlipidemia   . Hypertension   . Hypothyroidism    doctor's keeping an eye on thyroid      SURGICAL HISTORY   Past Surgical History:  Procedure Laterality Date  . ABDOMINAL HYSTERECTOMY       FAMILY HISTORY   Family History  Problem Relation Age of Onset  . Cancer Mother        breast  . Hypertension Mother   . Breast cancer Mother 27  . Heart disease Father   . Hyperlipidemia Brother   . Heart disease Brother      SOCIAL HISTORY   Social History   Tobacco Use  . Smoking status: Never Smoker  . Smokeless tobacco: Never Used  Substance Use Topics  . Alcohol use: No    Comment: occasional  . Drug use: No     MEDICATIONS    Home Medication:    Current Medication:  Current Facility-Administered Medications:   .  0.9 %  sodium chloride infusion, , Intravenous, Continuous, Tera Mater, MD, Last Rate: 10 mL/hr at 10/21/20 1149, New Bag at 10/21/20 1149 .  chlorhexidine (PERIDEX) 0.12 % solution, , , ,  .  famotidine (PEPCID) 20 MG tablet, , , ,     ALLERGIES   Patient has no allergy information on record.     REVIEW OF SYSTEMS    Review of Systems:  Gen:  Denies  fever, sweats, chills weigh loss  HEENT: Denies blurred vision, double vision, ear pain, eye pain, hearing loss, nose bleeds, sore throat Cardiac:  No dizziness, chest pain or heaviness, chest tightness,edema Resp:   Denies cough or sputum porduction, shortness of breath,wheezing, hemoptysis,  Gi: Denies swallowing difficulty, stomach pain, nausea or vomiting, diarrhea, constipation, bowel incontinence Gu:  Denies bladder incontinence, burning urine Ext:   Denies Joint pain, stiffness or swelling Skin: Denies  skin rash, easy bruising or bleeding or hives Endoc:  Denies polyuria, polydipsia , polyphagia or weight change Psych:   Denies depression, insomnia or hallucinations   Other:  All other systems negative   VS: BP (!) 162/80   Pulse 67   Temp (!) 97.3 F (36.3 C) (Temporal)   Resp 16   Ht 5\' 6"  (1.676 m)   Wt 70.8 kg   SpO2 100%  BMI 25.18 kg/m      PHYSICAL EXAM    GENERAL:NAD, no fevers, chills, no weakness no fatigue HEAD: Normocephalic, atraumatic.  EYES: Pupils equal, round, reactive to light. Extraocular muscles intact. No scleral icterus.  MOUTH: Moist mucosal membrane. Dentition intact. No abscess noted.  EAR, NOSE, THROAT: Clear without exudates. No external lesions.  NECK: Supple. No thyromegaly. No nodules. No JVD.  PULMONARY: Diffuse coarse rhonchi right sided +wheezes CARDIOVASCULAR: S1 and S2. Regular rate and rhythm. No murmurs, rubs, or gallops. No edema. Pedal pulses 2+ bilaterally.  GASTROINTESTINAL: Soft, nontender, nondistended. No masses. Positive bowel sounds. No  hepatosplenomegaly.  MUSCULOSKELETAL: No swelling, clubbing, or edema. Range of motion full in all extremities.  NEUROLOGIC: Cranial nerves II through XII are intact. No gross focal neurological deficits. Sensation intact. Reflexes intact.  SKIN: No ulceration, lesions, rashes, or cyanosis. Skin warm and dry. Turgor intact.  PSYCHIATRIC: Mood, affect within normal limits. The patient is awake, alert and oriented x 3. Insight, judgment intact.       IMAGING    CT CHEST WO CONTRAST  Result Date: 09/29/2020 CLINICAL DATA:  Small left upper lobe pulmonary nodule and large mediastinal mass on a recent chest CT. Pre bronchoscopy evaluation. EXAM: CT CHEST WITHOUT CONTRAST TECHNIQUE: Multidetector CT imaging of the chest was performed using thin slice collimation for electromagnetic bronchoscopy planning purposes, without intravenous contrast. COMPARISON:  09/03/2020 FINDINGS: Cardiovascular: Interval minimal pericardial effusion with a maximum thickness of 9 mm anteriorly. Normal sized heart. Mediastinum/Nodes: Previously described large mediastinal mass. Previously described thyroid nodules. Lungs/Pleura: Stable previously described left upper lobe nodule. Additional smaller bilateral lung nodules have not changed significantly. Upper Abdomen: Unremarkable. Musculoskeletal: Extensive thoracic and lower cervical spine degenerative changes. IMPRESSION: 1. Interval minimal pericardial effusion. 2. Previously described large mediastinal mass. 3. Stable bilateral lung nodules including the dominant nodule in the left upper lobe. 4. Previously described enlarged thyroid gland containing multiple nodules. Electronically Signed   By: Claudie Revering M.D.   On: 09/29/2020 16:23    CLINICAL DATA:  Small left upper lobe pulmonary nodule and large mediastinal mass on a recent chest CT. Pre bronchoscopy evaluation.  EXAM: CT CHEST WITHOUT CONTRAST  TECHNIQUE: Multidetector CT imaging of the chest was performed  using thin slice collimation for electromagnetic bronchoscopy planning purposes, without intravenous contrast.  COMPARISON:  09/03/2020  FINDINGS: Cardiovascular: Interval minimal pericardial effusion with a maximum thickness of 9 mm anteriorly. Normal sized heart.  Mediastinum/Nodes: Previously described large mediastinal mass. Previously described thyroid nodules.  Lungs/Pleura: Stable previously described left upper lobe nodule. Additional smaller bilateral lung nodules have not changed significantly.  Upper Abdomen: Unremarkable.  Musculoskeletal: Extensive thoracic and lower cervical spine degenerative changes.  IMPRESSION: 1. Interval minimal pericardial effusion. 2. Previously described large mediastinal mass. 3. Stable bilateral lung nodules including the dominant nodule in the left upper lobe. 4. Previously described enlarged thyroid gland containing multiple nodules.   Electronically Signed   By: Claudie Revering M.D.   On: 09/29/2020 16:23  ASSESSMENT/PLAN   Left upper lobe and mediastinal lung mass               -discussed with patient Concerning for malignancy  Plan for electromagnetic navigational as well as endobronchial ultrasound assisted lymph node biopsy   -Reviewed risks/complications and benefits with patient, risks include infection, pneumothorax/pneumomediastinum which may require chest tube placement as well as overnight/prolonged hospitalization and possible mechanical ventilation. Other risks include bleeding and very rarely death.  Patient understands risks and  wishes to proceed.  Additional questions were answered, and patient is aware that post procedure patient will be going home with family and may experience cough with possible clots on expectoration as well as phlegm which may last few days as well as hoarseness of voice post intubation and mechanical ventilation.  Thank you for allowing me to participate in the care of this patient.     Patient/Family are satisfied with care plan and all questions have been answered.  This document was prepared using Dragon voice recognition software and may include unintentional dictation errors.     Ottie Glazier, M.D.  Division of [place]

## 2020-10-21 NOTE — Anesthesia Procedure Notes (Signed)
Procedure Name: Intubation Performed by: Allean Found, CRNA Pre-anesthesia Checklist: Patient identified, Patient being monitored, Timeout performed, Emergency Drugs available and Suction available Patient Re-evaluated:Patient Re-evaluated prior to induction Oxygen Delivery Method: Circle system utilized Preoxygenation: Pre-oxygenation with 100% oxygen Induction Type: IV induction Ventilation: Mask ventilation without difficulty Laryngoscope Size: McGraph and 3 Grade View: Grade II Tube type: Oral Tube size: 8.5 mm Number of attempts: 1 Airway Equipment and Method: Stylet Placement Confirmation: ETT inserted through vocal cords under direct vision,  positive ETCO2 and breath sounds checked- equal and bilateral Secured at: 21 cm Tube secured with: Tape Dental Injury: Teeth and Oropharynx as per pre-operative assessment

## 2020-10-21 NOTE — Procedures (Signed)
ELECTROMAGNETIC NAVIGATIONAL BRONCHOSCOPY PROCEDURE NOTE  FIBEROPTIC BRONCHOSCOPY WITH BRONCHOALVEOLAR LAVAGE PROCEDURE NOTE  ENDOBRONCHIAL ULTRASOUND PROCEDURE NOTE    Flexible bronchoscopy was performed  by : Lanney Gins MD  assistance by : 1)Repiratory therapist  and 2)[employer] [facility] staff and 3) Anesthesia team and 4) Flouroscopy team    Indication for the procedure was :  Pre-procedural H&P. The following assessment was performed on the day of the procedure prior to initiating sedation History:  Chest pain n Dyspnea y Hemoptysis n Cough y Fever n Other pertinent items n  Examination Vital signs -reviewed as per nursing documentation today Cardiac    Murmurs: n  Rubs : n  Gallop: n Lungs Wheezing: n Rales : n Rhonchi :y  Other pertinent findings: SOB/hypoxemia due to chronic lung disease   Pre-procedural assessment for Procedural Sedation included: Depth of sedation: As per anesthesia team  ASA Classification:  2 Mallampati airway assessment: 3    Medication list reviewed: y  The patient's interval history was taken and revealed: no new complaints The pre- procedure physical examination revealed: No new findings Refer to prior clinic note for details.  Informed Consent: Informed consent was obtained from:  patient after explanation of procedure and risks, benefits, as well as alternative procedures available.  Explanation of level of sedation and possible transfusion was also provided.    Procedural Preparation: Time out was performed and patient was identified by name and birthdate and procedure to be performed and side for sampling, if any, was specified. Pt was intubated by anesthesia.  The patient was appropriately draped.   Fiberoptic bronchoscopy with airway inspection and BAL Procedure findings:  Bronchoscope was inserted via ETT  without difficulty.  Posterior oropharynx, epiglottis, arytenoids, false cords and vocal cords were not visualized  as these were bypassed by endotracheal tube. The distal trachea was normal in circumference and appearance without mucosal, cartilaginous or branching abnormalities.  The main carina was mildly splayed . All right and left lobar airways were visualized to the Subsegmental level.  Sub- sub segmental carinae were identified in all the distal airways.   Secretions were visible in the following airways and appeared to be clear.  The mucosa was : friable at LUL  Airways were notable for:        exophytic lesions :n       extrinsic compression in the following distributions: n.       Friable mucosa: y       Neurosurgeon /pigmentation: n     Post procedure Diagnosis:   Airway inspection with splayed main carina, friable mucosa of the left upper lobe with BAL done x3 at left upper lobe    Electromagnetic Navigational Bronchoscopy Procedure Findings:  After appropriate CT-guided planning ENB scope was advanced via endotracheal tube and LG was advanced for registration.  Post appropriate planning and registration peripheral navigation was used to visualize target lesion.    Post procedure diagnosis: Grossly necrotic appearing tissue on surgical biopsy of the left upper lobe target lesion.  [facility] unable to tell if atypical cells are present but abnormal cells are present       Endobronchial ultrasound assisted hilar and mediastinal lymph node biopsies procedure findings: The fiberoptic bronchoscope was removed and the EBUS scope was introduced. Examination began to evaluate for pathologically enlarged lymph nodes starting on the right side progressing to the left side.  All lymph node biopsies performed with 24 and 21 needle. Lymph node biopsies were sent in cytolite for all stations.  Station 10 R was initially evaluated and was found to be 0.6 cm this was not biopsied, station 7 was 1.6 cm and this was biopsied 3 times with good lymphoid shedding but without atypical cells per [employer]  report.  Next station 10 L was evaluated this was biopsied 4 times with malignant atypical cells found the size of this nodule was indeterminate because it was incongruence with what appeared to be abnormal mass.  Station for L was biopsied next and this was also incongruence with mass and was not able to be measured this was biopsied 4 times with definitive malignant atypical cells reported per [employer].  Additional specimens were sent for lymphoma work-up in RPMI solution remainder of the lymph node biopsies were all done in formalin   Post procedure diagnosis: Lung cancer   Specimens obtained included:                     Cytology brushes : X3 -left upper lobe  Broncho-alveolar lavage site: Left upper lobe sent for cytology                             97m volume infused 30 ml volume returned with serosanguineous with blood appearance    Immediate sampling complications included: Expected mild oozing of blood from biopsy site which spontaneously resolved without using any epinephrine Epinephrine 0 ml was used topically  The bronchoscopy was terminated due to completion of the planned procedure and the bronchoscope was removed.   Total dosage of Lidocaine was 0 mg Total fluoroscopy time was one-point minutes   Estimated Blood loss: 5-10 cc.  Complications included: None immediate   Disposition: Spoke with MElmer Rampafter procedure to discuss findings and postprocedure discharge instructions.  Follow up with Dr. ALanney Ginsin 5 days for result discussion.     FOttie GlazierMD  KSaranac LakeDivision of Pulmonary & Critical Care Medicine

## 2020-10-21 NOTE — Discharge Instructions (Addendum)
Flexible Bronchoscopy, Care After This sheet gives you information about how to care for yourself after your test. Your doctor may also give you more specific instructions. If you have problems or questions, contact your doctor. Follow these instructions at home: Eating and drinking  Do not eat or drink anything (not even water) for 2 hours after your test, or until your numbing medicine (local anesthetic) wears off.  When your numbness is gone and your cough and gag reflexes have come back, you may: ? Eat only soft foods. ? Slowly drink liquids.  The day after the test, go back to your normal diet. Driving  Do not drive for 24 hours if you were given a medicine to help you relax (sedative).  Do not drive or use heavy machinery while taking prescription pain medicine. General instructions   Take over-the-counter and prescription medicines only as told by your doctor.  Return to your normal activities as told. Ask what activities are safe for you.  Do not use any products that have nicotine or tobacco in them. This includes cigarettes and e-cigarettes. If you need help quitting, ask your doctor.  Keep all follow-up visits as told by your doctor. This is important. It is very important if you had a tissue sample (biopsy) taken. Get help right away if:  You have shortness of breath that gets worse.  You get light-headed.  You feel like you are going to pass out (faint).  You have chest pain.  You cough up: ? More than a little blood. ? More blood than before. Summary  Do not eat or drink anything (not even water) for 2 hours after your test, or until your numbing medicine wears off.  Do not use cigarettes. Do not use e-cigarettes.  Get help right away if you have chest pain. This information is not intended to replace advice given to you by your health care provider. Make sure you discuss any questions you have with your health care provider. Document Revised: 10/13/2017  Document Reviewed: 11/18/2016 Elsevier Patient Education  2020 [place]   1) The drugs that you were given will stay in your system until tomorrow so for the next 24 hours you should not:  A) Drive an automobile B) Make any legal decisions C) Drink any alcoholic beverage   2) You may resume regular meals tomorrow.  Today it is better to start with liquids and gradually work up to solid foods.  You may eat anything you prefer, but it is better to start with liquids, then soup and crackers, and gradually work up to solid foods.   3) Please notify your doctor immediately if you have any unusual bleeding, trouble breathing, redness and pain at the surgery site, drainage, fever, or pain not relieved by medication.    4) Additional Instructions:        Please contact your physician with any problems or Same Day Surgery at [phone], Monday through Friday 6 am to 4 pm, or [place] at [facility] number at [phone].

## 2020-10-21 NOTE — OR Nursing (Signed)
Radiology tech in to postop room for ordered xray 1612.

## 2020-10-21 NOTE — Progress Notes (Signed)
Serum potassium drawn and results 2.9, Dr Andree Elk notified of results.

## 2020-10-22 ENCOUNTER — Encounter: Payer: Self-pay | Admitting: Pulmonary Disease

## 2020-10-26 ENCOUNTER — Encounter: Payer: Self-pay | Admitting: *Deleted

## 2020-10-26 DIAGNOSIS — C3490 Malignant neoplasm of unspecified part of unspecified bronchus or lung: Secondary | ICD-10-CM

## 2020-10-26 LAB — CYTOLOGY - NON PAP

## 2020-10-26 LAB — SURGICAL PATHOLOGY

## 2020-10-26 NOTE — Progress Notes (Signed)
°  Oncology Nurse Navigator Documentation  Navigator Location: [facility]Med [facility]c (10/26/20 1500) Referral Date to RadOnc/MedOnc: 10/26/20 (10/26/20 1500) )Navigator Encounter Type: Appt/Treatment Plan Review (10/26/20 1500)   Abnormal Finding Date: 09/04/20 (10/26/20 1500) Confirmed Diagnosis Date: 10/26/20 (10/26/20 1500)                   Barriers/Navigation Needs: Coordination of Care (10/26/20 1500)   Interventions: Coordination of Care (10/26/20 1500)   Coordination of Care: Appts;Radiology (10/26/20 1500)             coordinating appts for PET scan and brain MRI. Will follow up with patient at new patient visit with Dr. Janese Banks on Thurs 12/16  Time Spent with Patient: 30 (10/26/20 1500)

## 2020-10-26 NOTE — Anesthesia Postprocedure Evaluation (Signed)
Anesthesia Post Note  Patient: Doris Johnson  Procedure(s) Performed: VIDEO BRONCHOSCOPY WITH ENDOBRONCHIAL NAVIGATION (N/A ) VIDEO BRONCHOSCOPY WITH ENDOBRONCHIAL ULTRASOUND (N/A )  Patient location during evaluation: PACU Anesthesia Type: General Level of consciousness: awake and alert and oriented Pain management: pain level controlled Vital Signs Assessment: post-procedure vital signs reviewed and stable Respiratory status: spontaneous breathing Cardiovascular status: blood pressure returned to baseline Anesthetic complications: no   No complications documented.   Last Vitals:  Vitals:   10/21/20 1544 10/21/20 1612  BP: (!) 143/77 (!) 144/71  Pulse: 73 61  Resp: 18 18  Temp: 36.5 C   SpO2: 100% 98%    Last Pain:  Vitals:   10/22/20 0844  TempSrc:   PainSc: 0-No pain                 Sharnika Binney

## 2020-10-27 ENCOUNTER — Other Ambulatory Visit: Payer: Self-pay | Admitting: Oncology

## 2020-10-27 LAB — CYTOLOGY - NON PAP

## 2020-10-29 ENCOUNTER — Inpatient Hospital Stay: Payer: Medicare HMO | Attending: Oncology | Admitting: Oncology

## 2020-10-29 ENCOUNTER — Encounter: Payer: Self-pay | Admitting: Oncology

## 2020-10-29 ENCOUNTER — Encounter: Payer: Self-pay | Admitting: *Deleted

## 2020-10-29 ENCOUNTER — Inpatient Hospital Stay: Payer: Medicare HMO

## 2020-10-29 ENCOUNTER — Other Ambulatory Visit: Payer: Self-pay | Admitting: *Deleted

## 2020-10-29 ENCOUNTER — Other Ambulatory Visit: Payer: Medicare HMO

## 2020-10-29 VITALS — BP 150/85 | HR 65 | Temp 97.9°F | Resp 18 | Wt 151.0 lb

## 2020-10-29 DIAGNOSIS — C778 Secondary and unspecified malignant neoplasm of lymph nodes of multiple regions: Secondary | ICD-10-CM

## 2020-10-29 DIAGNOSIS — C3490 Malignant neoplasm of unspecified part of unspecified bronchus or lung: Secondary | ICD-10-CM

## 2020-10-29 DIAGNOSIS — Z7189 Other specified counseling: Secondary | ICD-10-CM

## 2020-10-29 DIAGNOSIS — E049 Nontoxic goiter, unspecified: Secondary | ICD-10-CM

## 2020-10-29 DIAGNOSIS — Z79899 Other long term (current) drug therapy: Secondary | ICD-10-CM | POA: Diagnosis not present

## 2020-10-29 DIAGNOSIS — C3412 Malignant neoplasm of upper lobe, left bronchus or lung: Secondary | ICD-10-CM

## 2020-10-29 DIAGNOSIS — R5383 Other fatigue: Secondary | ICD-10-CM

## 2020-10-29 DIAGNOSIS — G893 Neoplasm related pain (acute) (chronic): Secondary | ICD-10-CM

## 2020-10-29 MED ORDER — OXYCODONE HCL 5 MG PO TABS: 5.0000 mg | ORAL_TABLET | Freq: Three times a day (TID) | ORAL | 0 refills | Status: DC | PRN

## 2020-10-29 NOTE — Progress Notes (Signed)
Tumor Board Documentation  Doris Johnson was presented by Dr Janese Banks at our Tumor Board on 10/29/2020, which included representatives from medical oncology,radiation oncology,internal medicine,navigation,pathology,radiology,surgical,pharmacy,genetics,research,palliative care,pulmonology.  Doris Johnson currently presents as a new patient,for MDC,for new positive pathology with history of the following treatments: surgical intervention(s),active survellience.  Additionally, we reviewed previous medical and familial history, history of present illness, and recent lab results along with all available histopathologic and imaging studies. The tumor board considered available treatment options and made the following recommendations: Additional screening (PET) Further treatment pending staging  The following procedures/referrals were also placed: No orders of the defined types were placed in this encounter.   Clinical Trial Status: not discussed   Staging used: To be determined  AJCC Staging:       Group: SmallCell Carcinoma   National site-specific guidelines   were discussed with respect to the case.  Tumor board is a meeting of clinicians from various specialty areas who evaluate and discuss patients for whom a multidisciplinary approach is being considered. Final determinations in the plan of care are those of the provider(s). The responsibility for follow up of recommendations given during tumor board is that of the provider.   Today's extended care, comprehensive team conference, Doris Johnson was not present for the discussion and was not examined.   Multidisciplinary Tumor Board is a multidisciplinary case peer review process.  Decisions discussed in the Multidisciplinary Tumor Board reflect the opinions of the specialists present at the conference without having examined the patient.  Ultimately, treatment and diagnostic decisions rest with the primary provider(s) and the patient.

## 2020-10-29 NOTE — Progress Notes (Signed)
  Oncology Nurse Navigator Documentation  Navigator Location: [facility] (10/29/20 1300)   )Navigator Encounter Type: Initial MedOnc (10/29/20 1300)                       Treatment Phase: Pre-Tx/Tx Discussion (10/29/20 1300) Barriers/Navigation Needs: Coordination of Care;Education (10/29/20 1300) Education: Newly Diagnosed Cancer Education;Understanding Cancer/ Treatment Options (10/29/20 1300) Interventions: Coordination of Care;Education (10/29/20 1300)   Coordination of Care: Appts;Chemo;Radiology (10/29/20 1300) Education Method: Verbal;Written (10/29/20 1300)      Acuity: Level 2-Minimal Needs (1-2 Barriers Identified) (10/29/20 1300)      met with patient during initial consult with Dr. Janese Banks to discuss pathology results and treatment options. All questions answered during visit. Pt given resources regarding diagnosis and supportive services available. Reviewed upcoming appts and informed that she will be notified once PET scan and brain MRI have been scheduled. At this time, both images are pending insurance authorization which pt is aware of. Contact info given and instructed to call with any further questions or needs. Pt verbalized understanding. Nothing further needed at this time.   Time Spent with Patient: 60 (10/29/20 1300)

## 2020-10-29 NOTE — Progress Notes (Signed)
Patient here for initial oncology appointment, expresses concerns of low appetite and constipation.

## 2020-10-30 ENCOUNTER — Encounter: Payer: Self-pay | Admitting: Oncology

## 2020-10-30 DIAGNOSIS — Z7189 Other specified counseling: Secondary | ICD-10-CM | POA: Insufficient documentation

## 2020-10-30 DIAGNOSIS — C3490 Malignant neoplasm of unspecified part of unspecified bronchus or lung: Secondary | ICD-10-CM | POA: Insufficient documentation

## 2020-10-30 NOTE — Patient Instructions (Signed)
Atezolizumab injection What is this medicine? ATEZOLIZUMAB (a te zoe LIZ ue mab) is a monoclonal antibody. It is used to treat bladder cancer (urothelial cancer), liver cancer, lung cancer, breast cancer, and melanoma. This medicine may be used for other purposes; ask your health care provider or pharmacist if you have questions. COMMON BRAND NAME(S): Tecentriq What should I tell my health care provider before I take this medicine? They need to know if you have any of these conditions:  diabetes  immune system problems  infection  inflammatory bowel disease  liver disease  lung or breathing disease  lupus  nervous system problems like myasthenia gravis or Guillain-Barre syndrome  organ transplant  an unusual or allergic reaction to atezolizumab, other medicines, foods, dyes, or preservatives  pregnant or trying to get pregnant  breast-feeding How should I use this medicine? This medicine is for infusion into a vein. It is given by a health care professional in a hospital or clinic setting. A special MedGuide will be given to you before each treatment. Be sure to read this information carefully each time. Talk to your pediatrician regarding the use of this medicine in children. Special care may be needed. Overdosage: If you think you have taken too much of this medicine contact a poison control center or emergency room at once. NOTE: This medicine is only for you. Do not share this medicine with others. What if I miss a dose? It is important not to miss your dose. Call your doctor or health care professional if you are unable to keep an appointment. What may interact with this medicine? Interactions have not been studied. This list may not describe all possible interactions. Give your health care provider a list of all the medicines, herbs, non-prescription drugs, or dietary supplements you use. Also tell them if you smoke, drink alcohol, or use illegal drugs. Some items may  interact with your medicine. What should I watch for while using this medicine? Your condition will be monitored carefully while you are receiving this medicine. You may need blood work done while you are taking this medicine. Do not become pregnant while taking this medicine or for at least 5 months after stopping it. Women should inform their doctor if they wish to become pregnant or think they might be pregnant. There is a potential for serious side effects to an unborn child. Talk to your health care professional or pharmacist for more information. Do not breast-feed an infant while taking this medicine or for at least 5 months after the last dose. What side effects may I notice from receiving this medicine? Side effects that you should report to your doctor or health care professional as soon as possible:  allergic reactions like skin rash, itching or hives, swelling of the face, lips, or tongue  black, tarry stools  bloody or watery diarrhea  breathing problems  changes in vision  chest pain or chest tightness  chills  facial flushing  fever  headache  signs and symptoms of high blood sugar such as dizziness; dry mouth; dry skin; fruity breath; nausea; stomach pain; increased hunger or thirst; increased urination  signs and symptoms of liver injury like dark yellow or brown urine; general ill feeling or flu-like symptoms; light-colored stools; loss of appetite; nausea; right upper belly pain; unusually weak or tired; yellowing of the eyes or skin  stomach pain  trouble passing urine or change in the amount of urine Side effects that usually do not require medical attention (report to  your doctor or health care professional if they continue or are bothersome):  bone pain  cough  diarrhea  joint pain  muscle pain  muscle weakness  swelling of arms or legs  tiredness  weight loss This list may not describe all possible side effects. Call your doctor for  medical advice about side effects. You may report side effects to FDA at 1-800-FDA-1088. Where should I keep my medicine? This drug is given in a hospital or clinic and will not be stored at home. NOTE: This sheet is a summary. It may not cover all possible information. If you have questions about this medicine, talk to your doctor, pharmacist, or health care provider.  2020 Elsevier/Gold Standard (2019-06-21 13:11:14) Etoposide, VP-16 injection What is this medicine? ETOPOSIDE, VP-16 (e toe POE side) is a chemotherapy drug. It is used to treat testicular cancer, lung cancer, and other cancers. This medicine may be used for other purposes; ask your health care provider or pharmacist if you have questions. COMMON BRAND NAME(S): Etopophos, Toposar, VePesid What should I tell my health care provider before I take this medicine? They need to know if you have any of these conditions:  infection  kidney disease  liver disease  low blood counts, like low white cell, platelet, or red cell counts  an unusual or allergic reaction to etoposide, other medicines, foods, dyes, or preservatives  pregnant or trying to get pregnant  breast-feeding How should I use this medicine? This medicine is for infusion into a vein. It is administered in a hospital or clinic by a specially trained health care professional. Talk to your pediatrician regarding the use of this medicine in children. Special care may be needed. Overdosage: If you think you have taken too much of this medicine contact a poison control center or emergency room at once. NOTE: This medicine is only for you. Do not share this medicine with others. What if I miss a dose? It is important not to miss your dose. Call your doctor or health care professional if you are unable to keep an appointment. What may interact with this medicine? This medicine may interact with the following medications:  warfarin This list may not describe all  possible interactions. Give your health care provider a list of all the medicines, herbs, non-prescription drugs, or dietary supplements you use. Also tell them if you smoke, drink alcohol, or use illegal drugs. Some items may interact with your medicine. What should I watch for while using this medicine? Visit your doctor for checks on your progress. This drug may make you feel generally unwell. This is not uncommon, as chemotherapy can affect healthy cells as well as cancer cells. Report any side effects. Continue your course of treatment even though you feel ill unless your doctor tells you to stop. In some cases, you may be given additional medicines to help with side effects. Follow all directions for their use. Call your doctor or health care professional for advice if you get a fever, chills or sore throat, or other symptoms of a cold or flu. Do not treat yourself. This drug decreases your body's ability to fight infections. Try to avoid being around people who are sick. This medicine may increase your risk to bruise or bleed. Call your doctor or health care professional if you notice any unusual bleeding. Talk to your doctor about your risk of cancer. You may be more at risk for certain types of cancers if you take this medicine. Do not become  pregnant while taking this medicine or for at least 6 months after stopping it. Women should inform their doctor if they wish to become pregnant or think they might be pregnant. Women of child-bearing potential will need to have a negative pregnancy test before starting this medicine. There is a potential for serious side effects to an unborn child. Talk to your health care professional or pharmacist for more information. Do not breast-feed an infant while taking this medicine. Men must use a latex condom during sexual contact with a woman while taking this medicine and for at least 4 months after stopping it. A latex condom is needed even if you have had a  vasectomy. Contact your doctor right away if your partner becomes pregnant. Do not donate sperm while taking this medicine and for at least 4 months after you stop taking this medicine. Men should inform their doctors if they wish to father a child. This medicine may lower sperm counts. What side effects may I notice from receiving this medicine? Side effects that you should report to your doctor or health care professional as soon as possible:  allergic reactions like skin rash, itching or hives, swelling of the face, lips, or tongue  low blood counts - this medicine may decrease the number of white blood cells, red blood cells, and platelets. You may be at increased risk for infections and bleeding  nausea, vomiting  redness, blistering, peeling or loosening of the skin, including inside the mouth  signs and symptoms of infection like fever; chills; cough; sore throat; pain or trouble passing urine  signs and symptoms of low red blood cells or anemia such as unusually weak or tired; feeling faint or lightheaded; falls; breathing problems  unusual bruising or bleeding Side effects that usually do not require medical attention (report to your doctor or health care professional if they continue or are bothersome):  changes in taste  diarrhea  hair loss  loss of appetite  mouth sores This list may not describe all possible side effects. Call your doctor for medical advice about side effects. You may report side effects to FDA at 1-800-FDA-1088. Where should I keep my medicine? This drug is given in a hospital or clinic and will not be stored at home. NOTE: This sheet is a summary. It may not cover all possible information. If you have questions about this medicine, talk to your doctor, pharmacist, or health care provider.  2020 Elsevier/Gold Standard (2018-12-26 16:57:15) Carboplatin injection What is this medicine? CARBOPLATIN (KAR boe pla tin) is a chemotherapy drug. It targets  fast dividing cells, like cancer cells, and causes these cells to die. This medicine is used to treat ovarian cancer and many other cancers. This medicine may be used for other purposes; ask your health care provider or pharmacist if you have questions. COMMON BRAND NAME(S): Paraplatin What should I tell my health care provider before I take this medicine? They need to know if you have any of these conditions:  blood disorders  hearing problems  kidney disease  recent or ongoing radiation therapy  an unusual or allergic reaction to carboplatin, cisplatin, other chemotherapy, other medicines, foods, dyes, or preservatives  pregnant or trying to get pregnant  breast-feeding How should I use this medicine? This drug is usually given as an infusion into a vein. It is administered in a hospital or clinic by a specially trained health care professional. Talk to your pediatrician regarding the use of this medicine in children. Special care may  be needed. Overdosage: If you think you have taken too much of this medicine contact a poison control center or emergency room at once. NOTE: This medicine is only for you. Do not share this medicine with others. What if I miss a dose? It is important not to miss a dose. Call your doctor or health care professional if you are unable to keep an appointment. What may interact with this medicine?  medicines for seizures  medicines to increase blood counts like filgrastim, pegfilgrastim, sargramostim  some antibiotics like amikacin, gentamicin, neomycin, streptomycin, tobramycin  vaccines Talk to your doctor or health care professional before taking any of these medicines:  acetaminophen  aspirin  ibuprofen  ketoprofen  naproxen This list may not describe all possible interactions. Give your health care provider a list of all the medicines, herbs, non-prescription drugs, or dietary supplements you use. Also tell them if you smoke, drink  alcohol, or use illegal drugs. Some items may interact with your medicine. What should I watch for while using this medicine? Your condition will be monitored carefully while you are receiving this medicine. You will need important blood work done while you are taking this medicine. This drug may make you feel generally unwell. This is not uncommon, as chemotherapy can affect healthy cells as well as cancer cells. Report any side effects. Continue your course of treatment even though you feel ill unless your doctor tells you to stop. In some cases, you may be given additional medicines to help with side effects. Follow all directions for their use. Call your doctor or health care professional for advice if you get a fever, chills or sore throat, or other symptoms of a cold or flu. Do not treat yourself. This drug decreases your body's ability to fight infections. Try to avoid being around people who are sick. This medicine may increase your risk to bruise or bleed. Call your doctor or health care professional if you notice any unusual bleeding. Be careful brushing and flossing your teeth or using a toothpick because you may get an infection or bleed more easily. If you have any dental work done, tell your dentist you are receiving this medicine. Avoid taking products that contain aspirin, acetaminophen, ibuprofen, naproxen, or ketoprofen unless instructed by your doctor. These medicines may hide a fever. Do not become pregnant while taking this medicine. Women should inform their doctor if they wish to become pregnant or think they might be pregnant. There is a potential for serious side effects to an unborn child. Talk to your health care professional or pharmacist for more information. Do not breast-feed an infant while taking this medicine. What side effects may I notice from receiving this medicine? Side effects that you should report to your doctor or health care professional as soon as  possible:  allergic reactions like skin rash, itching or hives, swelling of the face, lips, or tongue  signs of infection - fever or chills, cough, sore throat, pain or difficulty passing urine  signs of decreased platelets or bleeding - bruising, pinpoint red spots on the skin, black, tarry stools, nosebleeds  signs of decreased red blood cells - unusually weak or tired, fainting spells, lightheadedness  breathing problems  changes in hearing  changes in vision  chest pain  high blood pressure  low blood counts - This drug may decrease the number of white blood cells, red blood cells and platelets. You may be at increased risk for infections and bleeding.  nausea and vomiting  pain, swelling, redness or irritation at the injection site  pain, tingling, numbness in the hands or feet  problems with balance, talking, walking  trouble passing urine or change in the amount of urine Side effects that usually do not require medical attention (report to your doctor or health care professional if they continue or are bothersome):  hair loss  loss of appetite  metallic taste in the mouth or changes in taste This list may not describe all possible side effects. Call your doctor for medical advice about side effects. You may report side effects to FDA at 1-800-FDA-1088. Where should I keep my medicine? This drug is given in a hospital or clinic and will not be stored at home. NOTE: This sheet is a summary. It may not cover all possible information. If you have questions about this medicine, talk to your doctor, pharmacist, or health care provider.  2020 [employer]vier/Gold Standard (2008-02-05 14:38:05)

## 2020-10-30 NOTE — Progress Notes (Signed)
Hematology/Oncology Consult note [facility] Telephone:([phone] [phone]  Patient Care Team: Leonel Ramsay, MD as PCP - General (Infectious Diseases) Telford Nab, RN as Oncology Nurse Navigator   Name of the patient: Doris Johnson  [id number]  04-Jul-1951    Reason for referral-new diagnosis of small cell lung cancer   Referring physician-Dr. Lanney Gins  Date of visit: 10/30/20   History of presenting illness- Patient is a 69 year old female with a remote history of smoking in her teenage years and exposure to passive smoking.  She has been having ongoing midsternal pain which has been growing since the last 6 to 7 months.  She had been to urgent care as well in the past.  She finally had a CT chest with contrast done in October 2021 which showed a large mass in the left side of the mediastinum measuring 6.7 x 6.2 x 6.1 cm causing severe compression of the left main pulmonary artery and around the aortic arch in the left subclavian artery.  Enlarged thyroid gland.  Left upper lobe nodule 1 x 0.7 cm.  She then underwent bronchoscopy with Dr.Aleskerov left upper lobe biopsy was nondiagnostic.  However station 4, 7 and 10 L lymph nodes were consistent with metastatic small cell carcinoma.  Patient currently reports feeling well other than her midsternal chest pain which sometimes bothers her at night and makes it difficult for her to sleep.She was given tramadol which is not helping her pain is much.  Denies any constipation.  She lives with her daughter and her grandkids.  ECOG PS- 1  Pain scale- 3   Review of systems- Review of Systems  Constitutional: Positive for malaise/fatigue. Negative for chills, fever and weight loss.  HENT: Negative for congestion, ear discharge and nosebleeds.   Eyes: Negative for blurred vision.  Respiratory: Negative for cough, hemoptysis, sputum production, shortness of breath and wheezing.        Midsternal  pain  Cardiovascular: Negative for chest pain, palpitations, orthopnea and claudication.  Gastrointestinal: Negative for abdominal pain, blood in stool, constipation, diarrhea, heartburn, melena, nausea and vomiting.  Genitourinary: Negative for dysuria, flank pain, frequency, hematuria and urgency.  Musculoskeletal: Negative for back pain, joint pain and myalgias.  Skin: Negative for rash.  Neurological: Negative for dizziness, tingling, focal weakness, seizures, weakness and headaches.  Endo/Heme/Allergies: Does not bruise/bleed easily.  Psychiatric/Behavioral: Negative for depression and suicidal ideas. The patient does not have insomnia.     Not on File  Patient Active Problem List   Diagnosis Date Noted  . Diabetes mellitus, type 2 ([place]) 01/20/2016  . DD (diverticular disease) 01/20/2016  . Big thyroid 01/20/2016  . Adiposity 01/20/2016  . Hypercholesteremia 01/08/2016  . Adult BMI 30+ 12/10/2015  . BP (high blood pressure) 12/10/2015  . Allergic rhinitis 08/10/2015     Past Medical History:  Diagnosis Date  . Cataract   . Diabetes mellitus without complication ([place])   . Hyperlipidemia   . Hypertension   . Hypothyroidism    doctor's keeping an eye on thyroid      Past Surgical History:  Procedure Laterality Date  . ABDOMINAL HYSTERECTOMY    . VIDEO BRONCHOSCOPY WITH ENDOBRONCHIAL NAVIGATION N/A 10/21/2020   Procedure: VIDEO BRONCHOSCOPY WITH ENDOBRONCHIAL NAVIGATION;  Surgeon: Ottie Glazier, MD;  Location: [facility] ORS;  Service: Thoracic;  Laterality: N/A;  . VIDEO BRONCHOSCOPY WITH ENDOBRONCHIAL ULTRASOUND N/A 10/21/2020   Procedure: VIDEO BRONCHOSCOPY WITH ENDOBRONCHIAL ULTRASOUND;  Surgeon: Ottie Glazier, MD;  Location: [facility] ORS;  Service: Thoracic;  Laterality: N/A;    Social History   Socioeconomic History  . Marital status: Single    Spouse name: Not on file  . Number of children: Not on file  . Years of education: Not on file  . Highest education level:  Not on file  Occupational History  . Not on file  Tobacco Use  . Smoking status: Never Smoker  . Smokeless tobacco: Never Used  Substance and Sexual Activity  . Alcohol use: No    Comment: occasional  . Drug use: No  . Sexual activity: Not on file  Other Topics Concern  . Not on file  Social History Narrative  . Not on file   Social Determinants of Health   Financial Resource Strain: Not on file  Food Insecurity: Not on file  Transportation Needs: Not on file  Physical Activity: Not on file  Stress: Not on file  Social Connections: Not on file  Intimate Partner Violence: Not on file     Family History  Problem Relation Age of Onset  . Cancer Mother        breast  . Hypertension Mother   . Breast cancer Mother 29  . Heart disease Father   . Hyperlipidemia Brother   . Heart disease Brother      Current Outpatient Medications:  .  aspirin 325 MG tablet, Take 325 mg by mouth daily. , Disp: , Rfl:  .  docusate sodium (COLACE) 100 MG capsule, Take 200 mg by mouth at bedtime., Disp: , Rfl:  .  DUREZOL 0.05 % EMUL, Place 1 drop into the right eye 3 (three) times daily., Disp: , Rfl:  .  fluticasone (FLONASE) 50 MCG/ACT nasal spray, Place 2 sprays into both nostrils daily. (Patient taking differently: Place 2 sprays into both nostrils daily as needed for allergies.), Disp: 16 g, Rfl: 5 .  lisinopril (PRINIVIL,ZESTRIL) 20 MG tablet, TAKE 1 TABLET BY MOUTH EVERY DAY (Patient taking differently: Take 20 mg by mouth every evening.), Disp: 30 tablet, Rfl: 0 .  metFORMIN (GLUCOPHAGE) 500 MG tablet, Take 1,000 mg by mouth 2 (two) times daily with a meal. , Disp: , Rfl:  .  potassium chloride (KLOR-CON) 10 MEQ tablet, Take 10 mEq by mouth daily., Disp: , Rfl:  .  rosuvastatin (CRESTOR) 10 MG tablet, Take 10 mg by mouth every evening. , Disp: , Rfl:  .  triamterene-hydrochlorothiazide (DYAZIDE) 37.5-25 MG capsule, TAKE ONE CAPSULE BY MOUTH DAILY (Patient taking differently: Take 1  capsule by mouth daily.), Disp: 30 capsule, Rfl: 0 .  oxyCODONE (OXY IR/ROXICODONE) 5 MG immediate release tablet, Take 1 tablet (5 mg total) by mouth 3 (three) times daily as needed for severe pain., Disp: 45 tablet, Rfl: 0   Physical exam:  Vitals:   10/29/20 1057  BP: (!) 150/85  Pulse: 65  Resp: 18  Temp: 97.9 F (36.6 C)  TempSrc: Tympanic  SpO2: 100%  Weight: 151 lb (68.5 kg)   Physical Exam Constitutional:      General: She is not in acute distress. Eyes:     Extraocular Movements: EOM normal.  Cardiovascular:     Rate and Rhythm: Normal rate and regular rhythm.     Heart sounds: Normal heart sounds.  Pulmonary:     Effort: Pulmonary effort is normal.     Breath sounds: Normal breath sounds.  Abdominal:     General: Bowel sounds are normal.     Palpations: Abdomen is soft.  Lymphadenopathy:  Comments: No palpable cervical, supraclavicular, axillary or inguinal adenopathy   Skin:    General: Skin is warm and dry.  Neurological:     Mental Status: She is alert and oriented to person, place, and time.        CMP Latest Ref Rng & Units 10/21/2020  Glucose 70 - 99 mg/dL 99  BUN 8 - 23 mg/dL 9  Creatinine 0.44 - 1.00 mg/dL 0.50  Sodium 135 - 145 mmol/L 142  Potassium 3.5 - 5.1 mmol/L 2.9(L)  Chloride 98 - 111 mmol/L 105  Alkaline Phos 25 - 125 U/L -  AST 13 - 35 U/L -  ALT 7 - 35 U/L -   CBC Latest Ref Rng & Units 10/21/2020  WBC 4.0 - 10.5 K/uL -  Hemoglobin 12.0 - 15.0 g/dL 13.3  Hematocrit 36.0 - 46.0 % 39.0  Platelets 150 - 400 K/uL -    No images are attached to the encounter.  DG Chest Port 1 View  Addendum Date: 10/21/2020   ADDENDUM REPORT: 10/21/2020 17:39 ADDENDUM: Findings discussed with Dr. Ottie Glazier via telephone at 5:32 p.m. Electronically Signed   By: Margaretha Sheffield MD   On: 10/21/2020 17:39   Result Date: 10/21/2020 CLINICAL DATA:  Status post bronchoscopy. EXAM: PORTABLE CHEST 1 VIEW COMPARISON:  CT chest November 16, 21.  FINDINGS: Similar cardiomediastinal silhouette. Please see chest CT from September 29, 2020 for characterization of a large mediastinal mass and bilateral lung nodules. No new confluent consolidation. Linear density in the left lower lung, favored to reflect a skin fold. No visible pleural effusions. No acute osseous abnormality. IMPRESSION: 1. Linear density in the lower left lung is favored to represent a skin fold; however, recommend repeat chest radiograph (preferably PA and lateral if the patient is able) to exclude pneumothorax in this patient status post bronchoscopy. 2. Please see chest CT from September 29, 2020 for characterization of a large mediastinal mass and bilateral lung nodules. No new confluent consolidation. These results will be called to the ordering clinician or representative by the Radiologist Assistant, and communication documented in the PACS or [employer]. Electronically Signed: By: Margaretha Sheffield MD On: 10/21/2020 16:55   DG C-Arm 1-60 Min-No Report  Result Date: 10/21/2020 Fluoroscopy was utilized by the requesting physician.  No radiographic interpretation.    Assessment and plan- Patient is a 69 y.o. female with newly diagnosed small cell lung cancer T1 N2 MX  Discussed the results of the biopsy with the patient in detail.  Discussed the need to get a PET CT scan to complete her staging work-up as well as MRI brain with and without contrast.  Depending on the results of PET scan and MRI she will either have limited stage or extensive stage small cell lung cancer.  If she has limited stage disease she would benefit from concurrent chemoradiation.  If she has extensive stage small cell lung cancer I will consider proceeding with systemic chemoimmunotherapy.  Discussed she will either need cisplatin or carboplatin along with etoposide on day 1 and etoposide on day 2 and day 3 given IV every 3 weeks for 4 cycles.  Discussed risks and benefits of chemotherapy including  all but not limited to nausea, vomiting, low blood counts, risk of infections and hospitalization.  Risk of peripheral neuropathy, renal failure and hair loss as well as hearing loss associated with cisplatin.  Treatment will be given with curative versus palliative intent based on PET CT scan results.  Patient will  need a port placement prior to starting chemotherapy.  Chemo teach with a rate will be cisplatin or carboplatin with etoposide will depend on PET scan.  I will tentatively plan to see her in 2 weeks time to start first cycle of chemotherapy.  I noticed on her prior blood work that she does have a mild baseline chronic leukopenia/neutropenia which may be benign ethnic but she will need growth factor support with chemotherapy which may make it challenging to administer concurrently with radiation  Neoplasm related pain: I will start her on oxycodone 5 mg every 8 as needed for pain and adjust her dose based on tolerance and relief of symptoms.   Thank you for this kind referral and the opportunity to participate in the care of this patient   Visit Diagnosis 1. Small cell lung cancer ([place])   2. Goals of care, counseling/discussion   3. Neoplasm related pain     Dr. Randa Evens, MD, MPH [facility] at [facility] [phone] 10/30/2020 8:51 AM

## 2020-11-02 ENCOUNTER — Inpatient Hospital Stay: Payer: Medicare HMO

## 2020-11-02 ENCOUNTER — Telehealth: Payer: Self-pay | Admitting: *Deleted

## 2020-11-02 NOTE — Telephone Encounter (Signed)
Pt has been made aware of upcoming appts for brain MRI and PET scan. Verbalized understanding.

## 2020-11-03 ENCOUNTER — Telehealth (INDEPENDENT_AMBULATORY_CARE_PROVIDER_SITE_OTHER): Payer: Self-pay

## 2020-11-03 NOTE — Telephone Encounter (Signed)
I attempted to contact the patient and a message was left for a return call. 

## 2020-11-03 NOTE — Telephone Encounter (Signed)
FYI- I placed a referral last week for pt to get port placement. Her chemo is scheduled for 1/4. I wasn't sure if I already sent you a message on her.   Thanks!  -Hayley    I attempted to contact the patient and a message was left for a return call. I do have an appt on 11/16/20 available.  Johna Sheriff, Angie Fava, RN  Devona Konig, [place] just spoke with her and she is good for port placement on 1/3.   -Billie Lade, CMA  Telford Nab, RN Okay arrive to the MM at 7:45 am , NPO, one person with her and dc Metformin 24 hrs before and 48 after procedure. Get a covid test on 12/30 between 8-1 pm. I will mail this information as well.

## 2020-11-05 ENCOUNTER — Encounter: Payer: Self-pay | Admitting: *Deleted

## 2020-11-05 DIAGNOSIS — C3490 Malignant neoplasm of unspecified part of unspecified bronchus or lung: Secondary | ICD-10-CM

## 2020-11-05 NOTE — Progress Notes (Signed)
  Oncology Nurse Navigator Documentation  Navigator Location: [facility] (11/05/20 1300)   )Navigator Encounter Type: Lobby (11/05/20 1300)                         Barriers/Navigation Needs: Coordination of Care (11/05/20 1300)   Interventions: Coordination of Care;Referrals (11/05/20 1300) Referrals: Nutrition/dietician (11/05/20 1300) Coordination of Care: Appts (11/05/20 1300)       met with patient and her daughter in the lobby to review upcoming appts and answer questions. Pt's brother was contacted over the phone during visit to answer questions regarding diagnosis and upcoming treatment. All questions answered during visit. Pt given print out of upcoming appts including scheduled port placement. Pt stated that she has had recent weight loss and decreased appetite. Discussed referral to dietitian and pt was interested. Referral placed and pt informed to expect an appt in the next few weeks. Nothing further needed at this time. Instructed pt to call with any further questions or needs. Pt verbalized understanding.           Time Spent with Patient: 60 (11/05/20 1300)

## 2020-11-09 ENCOUNTER — Ambulatory Visit
Admission: RE | Admit: 2020-11-09 | Discharge: 2020-11-09 | Disposition: A | Discharge status: Home / Self Care | Payer: Medicare HMO | Source: Ambulatory Visit | Attending: Oncology | Admitting: Oncology

## 2020-11-09 ENCOUNTER — Other Ambulatory Visit: Payer: Self-pay

## 2020-11-09 DIAGNOSIS — C3490 Malignant neoplasm of unspecified part of unspecified bronchus or lung: Secondary | ICD-10-CM | POA: Insufficient documentation

## 2020-11-09 MED ORDER — GADOBUTROL 1 MMOL/ML IV SOLN
7.0000 mL | Freq: Once | INTRAVENOUS | Status: AC | PRN
  Administered 2020-11-09: 7 mL via INTRAVENOUS

## 2020-11-11 ENCOUNTER — Other Ambulatory Visit: Payer: Self-pay

## 2020-11-11 ENCOUNTER — Encounter
Admission: RE | Admit: 2020-11-11 | Discharge: 2020-11-11 | Disposition: A | Discharge status: Home / Self Care | Payer: Medicare HMO | Source: Ambulatory Visit | Attending: Oncology | Admitting: Oncology

## 2020-11-11 DIAGNOSIS — E049 Nontoxic goiter, unspecified: Secondary | ICD-10-CM | POA: Diagnosis not present

## 2020-11-11 DIAGNOSIS — I700 Atherosclerosis of aorta: Secondary | ICD-10-CM | POA: Diagnosis not present

## 2020-11-11 DIAGNOSIS — C3490 Malignant neoplasm of unspecified part of unspecified bronchus or lung: Secondary | ICD-10-CM | POA: Diagnosis present

## 2020-11-11 LAB — GLUCOSE, CAPILLARY: Glucose-Capillary: 114 mg/dL — ABNORMAL HIGH (ref 70–99)

## 2020-11-11 MED ORDER — FLUDEOXYGLUCOSE F - 18 (FDG) INJECTION
7.8000 | Freq: Once | INTRAVENOUS | Status: AC | PRN
  Administered 2020-11-11: 8.091 via INTRAVENOUS

## 2020-11-12 ENCOUNTER — Other Ambulatory Visit
Admission: RE | Admit: 2020-11-12 | Discharge: 2020-11-12 | Disposition: A | Discharge status: Home / Self Care | Payer: Medicare HMO | Source: Ambulatory Visit | Attending: Vascular Surgery | Admitting: Vascular Surgery

## 2020-11-12 ENCOUNTER — Telehealth: Payer: Self-pay | Admitting: *Deleted

## 2020-11-12 ENCOUNTER — Other Ambulatory Visit: Payer: Self-pay | Admitting: Oncology

## 2020-11-12 ENCOUNTER — Other Ambulatory Visit: Payer: Self-pay | Admitting: *Deleted

## 2020-11-12 DIAGNOSIS — U071 COVID-19: Secondary | ICD-10-CM | POA: Insufficient documentation

## 2020-11-12 DIAGNOSIS — C3490 Malignant neoplasm of unspecified part of unspecified bronchus or lung: Secondary | ICD-10-CM

## 2020-11-12 DIAGNOSIS — Z01812 Encounter for preprocedural laboratory examination: Secondary | ICD-10-CM | POA: Insufficient documentation

## 2020-11-12 LAB — SARS CORONAVIRUS 2 (TAT 6-24 HRS): SARS Coronavirus 2: POSITIVE — AB

## 2020-11-12 MED ORDER — LIDOCAINE-PRILOCAINE 2.5-2.5 % EX CREA: 1.0000 "application " | TOPICAL_CREAM | CUTANEOUS | 1 refills | Status: DC | PRN

## 2020-11-12 MED ORDER — PROCHLORPERAZINE MALEATE 10 MG PO TABS: 10.0000 mg | ORAL_TABLET | Freq: Four times a day (QID) | ORAL | 1 refills | Status: DC | PRN

## 2020-11-12 MED ORDER — ONDANSETRON HCL 8 MG PO TABS: 8.0000 mg | ORAL_TABLET | Freq: Two times a day (BID) | ORAL | 1 refills | Status: DC | PRN

## 2020-11-12 MED ORDER — LORAZEPAM 0.5 MG PO TABS: 0.5000 mg | ORAL_TABLET | Freq: Four times a day (QID) | ORAL | 0 refills | Status: DC | PRN

## 2020-11-12 NOTE — Telephone Encounter (Signed)
Patient called stating that she is having her port placed on 1/3 and chemotherapy on 1/4 and that she needs to have the EMLA cream ordered so that she can have for the 4 th

## 2020-11-12 NOTE — Telephone Encounter (Signed)
Contacted patient and reviewed results of MRI and PET scan with expected treatment plan as discussed with Dr. Janese Banks. Patient is aware that this plan could change at the time of her appointment next week. Patient has my contact information for any other questions or concerns.

## 2020-11-12 NOTE — Telephone Encounter (Signed)
Dr. Janese Banks patient.

## 2020-11-12 NOTE — Progress Notes (Signed)
START ON PATHWAY REGIMEN - Small Cell Lung     Cycles 1 through 4, every 21 days:     Atezolizumab      Carboplatin      Etoposide    Cycles 5 and beyond, every 21 days:     Atezolizumab   **Always confirm dose/schedule in your pharmacy ordering system**  Patient Characteristics: Newly Diagnosed, Preoperative or Nonsurgical Candidate (Clinical Staging), First Line, Extensive Stage Therapeutic Status: Newly Diagnosed, Preoperative or Nonsurgical Candidate (Clinical Staging) AJCC T Category: cT4 AJCC N Category: cNX AJCC M Category: cM1 AJCC 8 Stage Grouping: IV Stage Classification: Extensive  Intent of Therapy: Non-Curative / Palliative Intent, Discussed with Patient

## 2020-11-16 ENCOUNTER — Ambulatory Visit: Admission: RE | Admit: 2020-11-16 | Payer: Medicare HMO | Source: Home / Self Care | Admitting: Vascular Surgery

## 2020-11-16 ENCOUNTER — Telehealth: Payer: Self-pay | Admitting: *Deleted

## 2020-11-16 ENCOUNTER — Encounter: Admission: RE | Payer: Self-pay | Source: Home / Self Care

## 2020-11-16 ENCOUNTER — Other Ambulatory Visit (INDEPENDENT_AMBULATORY_CARE_PROVIDER_SITE_OTHER): Payer: Self-pay | Admitting: Nurse Practitioner

## 2020-11-16 SURGERY — PORTA CATH INSERTION
Anesthesia: Moderate Sedation

## 2020-11-16 NOTE — Telephone Encounter (Signed)
error 

## 2020-11-17 ENCOUNTER — Inpatient Hospital Stay: Payer: Medicare HMO

## 2020-11-17 ENCOUNTER — Encounter: Payer: Self-pay | Admitting: *Deleted

## 2020-11-17 ENCOUNTER — Inpatient Hospital Stay: Payer: Medicare HMO | Admitting: Oncology

## 2020-11-17 NOTE — Progress Notes (Addendum)
  Oncology Nurse Navigator Documentation  Navigator Location: [facility] (11/17/20 0900)   )Navigator Encounter Type: Appt/Treatment Plan Review (11/17/20 0900)                         Barriers/Navigation Needs: Coordination of Care (11/17/20 0900)   Interventions: Coordination of Care (11/17/20 0900)   Coordination of Care: Appts;Chemo (11/17/20 0900)            Appt/treatment plan review. Will follow up with patient on 12/03/19 after starting chemotherapy.          Time Spent with Patient: 15 (11/17/20 0900)

## 2020-11-20 ENCOUNTER — Other Ambulatory Visit: Payer: Self-pay | Admitting: *Deleted

## 2020-11-20 DIAGNOSIS — C3490 Malignant neoplasm of unspecified part of unspecified bronchus or lung: Secondary | ICD-10-CM

## 2020-11-20 MED ORDER — OXYCODONE HCL 5 MG PO TABS: 5.0000 mg | ORAL_TABLET | Freq: Three times a day (TID) | ORAL | 0 refills | Status: DC | PRN

## 2020-11-20 NOTE — Telephone Encounter (Signed)
Pt requesting refill of oxycodone.

## 2020-11-27 NOTE — Progress Notes (Signed)
Spoke with patient on telephone regarding potential for hazardous weather on Monday 1/17, am of scheduled procedure. Patient agreed to delay arrival to hospital, now to come in at 0900 Monday 1/17 for procedure start at 1000 due to possible hazardous driving conditions.

## 2020-11-29 ENCOUNTER — Other Ambulatory Visit (INDEPENDENT_AMBULATORY_CARE_PROVIDER_SITE_OTHER): Payer: Self-pay | Admitting: Nurse Practitioner

## 2020-11-30 ENCOUNTER — Encounter
Admission: RE | Disposition: A | Discharge status: Home / Self Care | Payer: Self-pay | Source: Home / Self Care | Attending: Vascular Surgery

## 2020-11-30 ENCOUNTER — Ambulatory Visit
Admission: RE | Admit: 2020-11-30 | Discharge: 2020-11-30 | Disposition: A | Discharge status: Home / Self Care | Payer: Medicare HMO | Attending: Vascular Surgery | Admitting: Vascular Surgery

## 2020-11-30 ENCOUNTER — Encounter: Payer: Self-pay | Admitting: Vascular Surgery

## 2020-11-30 DIAGNOSIS — C3490 Malignant neoplasm of unspecified part of unspecified bronchus or lung: Secondary | ICD-10-CM

## 2020-11-30 DIAGNOSIS — C3492 Malignant neoplasm of unspecified part of left bronchus or lung: Secondary | ICD-10-CM | POA: Diagnosis present

## 2020-11-30 DIAGNOSIS — I10 Essential (primary) hypertension: Secondary | ICD-10-CM | POA: Insufficient documentation

## 2020-11-30 DIAGNOSIS — E119 Type 2 diabetes mellitus without complications: Secondary | ICD-10-CM | POA: Insufficient documentation

## 2020-11-30 HISTORY — PX: PORTA CATH INSERTION: CATH118285

## 2020-11-30 LAB — GLUCOSE, CAPILLARY: Glucose-Capillary: 101 mg/dL — ABNORMAL HIGH (ref 70–99)

## 2020-11-30 SURGERY — PORTA CATH INSERTION
Anesthesia: Moderate Sedation

## 2020-11-30 MED ORDER — HYDROMORPHONE HCL 1 MG/ML IJ SOLN: 1.0000 mg | Freq: Once | INTRAMUSCULAR | Status: DC | PRN

## 2020-11-30 MED ORDER — MIDAZOLAM HCL 2 MG/2ML IJ SOLN
INTRAMUSCULAR | Status: AC
  Administered 2020-11-30: 2 mg
  Filled 2020-11-30: qty 2

## 2020-11-30 MED ORDER — SODIUM CHLORIDE 0.9 % IV SOLN
INTRAVENOUS | Status: DC
  Administered 2020-11-30: 1000 mL via INTRAVENOUS

## 2020-11-30 MED ORDER — MIDAZOLAM HCL 2 MG/ML PO SYRP: 8.0000 mg | ORAL_SOLUTION | Freq: Once | ORAL | Status: DC | PRN

## 2020-11-30 MED ORDER — SODIUM CHLORIDE 0.9 % IV SOLN: Freq: Once | INTRAVENOUS | Status: DC

## 2020-11-30 MED ORDER — DIPHENHYDRAMINE HCL 50 MG/ML IJ SOLN: 50.0000 mg | Freq: Once | INTRAMUSCULAR | Status: DC | PRN

## 2020-11-30 MED ORDER — CEFAZOLIN SODIUM-DEXTROSE 2-4 GM/100ML-% IV SOLN
2.0000 g | Freq: Once | INTRAVENOUS | Status: AC
  Administered 2020-11-30: 2 g via INTRAVENOUS

## 2020-11-30 MED ORDER — CEFAZOLIN SODIUM-DEXTROSE 2-4 GM/100ML-% IV SOLN
INTRAVENOUS | Status: AC
  Filled 2020-11-30: qty 100

## 2020-11-30 MED ORDER — FENTANYL CITRATE (PF) 100 MCG/2ML IJ SOLN
INTRAMUSCULAR | Status: AC
  Administered 2020-11-30: 50 ug
  Filled 2020-11-30: qty 2

## 2020-11-30 MED ORDER — ONDANSETRON HCL 4 MG/2ML IJ SOLN: 4.0000 mg | Freq: Four times a day (QID) | INTRAMUSCULAR | Status: DC | PRN

## 2020-11-30 MED ORDER — METHYLPREDNISOLONE SODIUM SUCC 125 MG IJ SOLR: 125.0000 mg | Freq: Once | INTRAMUSCULAR | Status: DC | PRN

## 2020-11-30 MED ORDER — CHLORHEXIDINE GLUCONATE CLOTH 2 % EX PADS: 6.0000 | MEDICATED_PAD | Freq: Every day | CUTANEOUS | Status: DC

## 2020-11-30 MED ORDER — FAMOTIDINE 20 MG PO TABS: 40.0000 mg | ORAL_TABLET | Freq: Once | ORAL | Status: DC | PRN

## 2020-11-30 SURGICAL SUPPLY — 11 items
ADH SKN CLS APL DERMABOND .7 (GAUZE/BANDAGES/DRESSINGS) ×1
DERMABOND ADVANCED (GAUZE/BANDAGES/DRESSINGS) ×1
DERMABOND ADVANCED .7 DNX12 (GAUZE/BANDAGES/DRESSINGS) ×1 IMPLANT
KIT PORT POWER 8FR ISP CVUE (Port) ×2 IMPLANT
PACK ANGIOGRAPHY (CUSTOM PROCEDURE TRAY) ×2 IMPLANT
SUT MNCRL 4-0 (SUTURE) ×2
SUT MNCRL 4-0 27XMFL (SUTURE) ×1
SUT MNCRL AB 4-0 PS2 18 (SUTURE) ×2 IMPLANT
SUT VIC AB 3-0 CT1 27 (SUTURE) ×2
SUT VIC AB 3-0 CT1 TAPERPNT 27 (SUTURE) ×1 IMPLANT
SUTURE MNCRL 4-0 27XMF (SUTURE) ×1 IMPLANT

## 2020-11-30 NOTE — H&P (Signed)
[facility] Admission History & Physical  MRN : [id number]  Doris Johnson is a 70 y.o. (16-Jan-1951) female who presents with chief complaint of No chief complaint on file. Marland Kitchen  History of Present Illness: Patient presents today for Port-A-Cath placement. She has small cell lung cancer which is metastatic. She needs a Port-A-Cath for chemotherapy and durable venous access as her peripheral access is limited. She has shortness of breath with limited activity and significant fatigue. No fevers or chills.  Current Facility-Administered Medications  Medication Dose Route Frequency Provider Last Rate Last Admin  . 0.9 %  sodium chloride infusion   Intravenous Continuous Kris Hartmann, NP 75 mL/hr at 11/30/20 0944 1,000 mL at 11/30/20 0944  . ceFAZolin (ANCEF) 2-4 GM/100ML-% IVPB           . ceFAZolin (ANCEF) IVPB 2g/100 mL premix  2 g Intravenous Once Kris Hartmann, NP      . Chlorhexidine Gluconate Cloth 2 % PADS 6 each  6 each Topical Q0600 Kris Hartmann, NP      . diphenhydrAMINE (BENADRYL) injection 50 mg  50 mg Intravenous Once PRN Kris Hartmann, NP      . famotidine (PEPCID) tablet 40 mg  40 mg Oral Once PRN Kris Hartmann, NP      . fentaNYL (SUBLIMAZE) 100 MCG/2ML injection           . gentamicin (GARAMYCIN) 80 mg in sodium chloride 0.9 % 500 mL irrigation   Irrigation Once Eulogio Ditch E, NP      . HYDROmorphone (DILAUDID) injection 1 mg  1 mg Intravenous Once PRN Eulogio Ditch E, NP      . methylPREDNISolone sodium succinate (SOLU-MEDROL) 125 mg/2 mL injection 125 mg  125 mg Intravenous Once PRN Kris Hartmann, NP      . midazolam (VERSED) 2 MG/2ML injection           . midazolam (VERSED) 2 MG/ML syrup 8 mg  8 mg Oral Once PRN Kris Hartmann, NP      . ondansetron [facility]) injection 4 mg  4 mg Intravenous Q6H PRN Kris Hartmann, NP        Past Medical History:  Diagnosis Date  . Cataract   . Diabetes mellitus without complication ([place])   .  Hyperlipidemia   . Hypertension   . Hypothyroidism    doctor's keeping an eye on thyroid     Past Surgical History:  Procedure Laterality Date  . ABDOMINAL HYSTERECTOMY    . VIDEO BRONCHOSCOPY WITH ENDOBRONCHIAL NAVIGATION N/A 10/21/2020   Procedure: VIDEO BRONCHOSCOPY WITH ENDOBRONCHIAL NAVIGATION;  Surgeon: Ottie Glazier, MD;  Location: [facility] ORS;  Service: Thoracic;  Laterality: N/A;  . VIDEO BRONCHOSCOPY WITH ENDOBRONCHIAL ULTRASOUND N/A 10/21/2020   Procedure: VIDEO BRONCHOSCOPY WITH ENDOBRONCHIAL ULTRASOUND;  Surgeon: Ottie Glazier, MD;  Location: [facility] ORS;  Service: Thoracic;  Laterality: N/A;     Social History   Tobacco Use  . Smoking status: Never Smoker  . Smokeless tobacco: Never Used  Substance Use Topics  . Alcohol use: No    Comment: occasional  . Drug use: No     Family History  Problem Relation Age of Onset  . Cancer Mother        breast  . Hypertension Mother   . Breast cancer Mother 36  . Heart disease Father   . Hyperlipidemia Brother   . Heart disease Brother     No Known Allergies  REVIEW OF SYSTEMS (Negative unless checked)  Constitutional: [] Weight loss  [] Fever  [] Chills Cardiac: [] Chest pain   [] Chest pressure   [] Palpitations   [] Shortness of breath when laying flat   [] Shortness of breath at rest   [] Shortness of breath with exertion. Vascular:  [] Pain in legs with walking   [] Pain in legs at rest   [] Pain in legs when laying flat   [] Claudication   [] Pain in feet when walking  [] Pain in feet at rest  [] Pain in feet when laying flat   [] History of DVT   [] Phlebitis   [] Swelling in legs   [] Varicose veins   [] Non-healing ulcers Pulmonary:   [] Uses home oxygen   [] Productive cough   [] Hemoptysis   [] Wheeze  [] COPD   [] Asthma Neurologic:  [] Dizziness  [] Blackouts   [] Seizures   [] History of stroke   [] History of TIA  [] Aphasia   [] Temporary blindness   [] Dysphagia   [] Weakness or numbness in arms   [] Weakness or numbness in  legs Musculoskeletal:  [x] Arthritis   [] Joint swelling   [x] Joint pain   [] Low back pain Hematologic:  [] Easy bruising  [] Easy bleeding   [] Hypercoagulable state   [] Anemic  [] Hepatitis Gastrointestinal:  [] Blood in stool   [] Vomiting blood  [x] Gastroesophageal reflux/heartburn   [] Difficulty swallowing. Genitourinary:  [] Chronic kidney disease   [] Difficult urination  [] Frequent urination  [] Burning with urination   [] Blood in urine Skin:  [] Rashes   [] Ulcers   [] Wounds Psychological:  [] History of anxiety   []  History of major depression.  Physical Examination  Vitals:   11/30/20 0931  BP: (!) 153/74  Pulse: 77  Resp: 16  Temp: 98.4 F (36.9 C)  TempSrc: Oral  SpO2: 98%  Weight: 68.5 kg  Height: 5\' 6"  (1.676 m)   Body mass index is 24.37 kg/m. Gen: WD/WN, NAD Head: [place]/AT, No temporalis wasting.  Ear/Nose/Throat: Hearing grossly intact, nares w/o erythema or drainage, oropharynx w/o Erythema/Exudate,  Eyes: Conjunctiva clear, sclera non-icteric Neck: Trachea midline.  No JVD.  Pulmonary:  Good air movement, respirations not labored, no use of accessory muscles.  Cardiac: RRR, normal S1, S2. Vascular:  Vessel Right Left  Radial Palpable Palpable                   Musculoskeletal: M/S 5/5 throughout.  Extremities without ischemic changes.  No deformity or atrophy.  Neurologic: Sensation grossly intact in extremities.  Symmetrical.  Speech is fluent. Motor exam as listed above. Psychiatric: Judgment intact, Mood & affect appropriate for pt's clinical situation. Dermatologic: No rashes or ulcers noted.  No cellulitis or open wounds.      CBC Lab Results  Component Value Date   WBC 3.2 (L) 09/29/2020   HGB 13.3 10/21/2020   HCT 39.0 10/21/2020   MCV 87.9 09/29/2020   PLT 286 09/29/2020    BMET    Component Value Date/Time   NA 142 10/21/2020 1132   NA 141 03/09/2015 0000   K 2.9 (L) 10/21/2020 1132   CL 105 10/21/2020 1132   GLUCOSE 99 10/21/2020 1132    BUN 9 10/21/2020 1132   BUN 11 03/09/2015 0000   CREATININE 0.50 10/21/2020 1132   CrCl cannot be calculated (Patient's most recent lab result is older than the maximum 21 days allowed.).  COAG Lab Results  Component Value Date   INR 1.0 09/29/2020    Radiology MR Brain W Wo Contrast  Result Date: 11/09/2020 CLINICAL DATA:  Small-cell lung cancer, staging. EXAM:  MRI HEAD WITHOUT AND WITH CONTRAST TECHNIQUE: Multiplanar, multiecho pulse sequences of the brain and surrounding structures were obtained without and with intravenous contrast. CONTRAST:  16mL GADAVIST GADOBUTROL 1 MMOL/ML IV SOLN COMPARISON:  None. FINDINGS: Brain: No acute infarction, hemorrhage, hydrocephalus or extra-axial collection. A 1-2 mm focus of contrast enhancement in the right cerebellar hemisphere (series 18, image 54) and a 2-3 mm focus of contrast enhancement in the right temporal lobe (series 18, image 68) without associated mass effect or edema are concerning metastatic lesions. A few scattered foci of T2 hyperintensity are seen within the white matter of the cerebral hemispheres, nonspecific. Vascular: Normal flow voids. Skull and upper cervical spine: Normal marrow signal. Sinuses/Orbits: Negative. IMPRESSION: 1. Two small foci of contrast enhancement in the right cerebellar hemisphere and in the right temporal lobe without associated mass effect or edema are concerning for metastatic lesions. 2. A few scattered foci of T2 hyperintensity within the white matter of the cerebral hemispheres, nonspecific. Electronically Signed   By: Pedro Earls M.D.   On: 11/09/2020 19:40   NM PET Image Initial (PI) Skull Base To Thigh  Result Date: 11/11/2020 CLINICAL DATA:  Initial treatment strategy for biopsy-proven small cell left lung cancer. EXAM: NUCLEAR MEDICINE PET SKULL BASE TO THIGH TECHNIQUE: 8.1 mCi F-18 FDG was injected intravenously. Full-ring PET imaging was performed from the skull base to thigh after  the radiotracer. CT data was obtained and used for attenuation correction and anatomic localization. Fasting blood glucose: 114 mg/dl COMPARISON:  09/03/2020 and 09/29/2020 chest CT studies. FINDINGS: Mediastinal blood pool activity: SUV max 2.2 Liver activity: SUV max NA NECK: Enlarged hypermetabolic 2.2 cm left level 3 neck node with max SUV 9.7 (series 3/image 45). No additional enlarged or hypermetabolic lymph nodes in the neck. Incidental CT findings: Enlarged heterogeneous thyroid gland without hypermetabolic or discrete thyroid nodules. CHEST: Intensely hypermetabolic locally infiltrative 7.3 x 6.3 cm solid mass centered in the AP window of the left mediastinum with max SUV 11.3 (series 3/image 75), contiguous with the upper left hilum and left prevascular nodal chain. No enlarged or hypermetabolic axillary nodes. No hypermetabolic right hilar or right mediastinal nodes. No separate hypermetabolic pulmonary findings. Apical left upper lobe 0.9 cm solid pulmonary nodule demonstrates low level uptake with max SUV 2.1 (series 3/image 62). Incidental CT findings: Minimally atherosclerotic nonaneurysmal thoracic aorta. ABDOMEN/PELVIS: Hypermetabolic solid 5.7 cm segment 4B left liver mass with max SUV 10.7 (series 3/image 126). No additional hypermetabolic liver masses. No abnormal hypermetabolic activity within the pancreas, adrenal glands, or spleen. No hypermetabolic lymph nodes in the abdomen or pelvis. Incidental CT findings: Minimally atherosclerotic nonaneurysmal abdominal aorta. Hysterectomy. SKELETON: No focal hypermetabolic activity to suggest skeletal metastasis. Incidental CT findings: none IMPRESSION: 1. Extensive stage hypermetabolic small cell left lung cancer involving the AP window, left neck and liver as detailed. 2. Heterogeneous goiter without hypermetabolic or discrete thyroid nodules. 3. Aortic Atherosclerosis (ICD10-I70.0). Electronically Signed   By: Ilona Sorrel M.D.   On: 11/11/2020 13:41      Assessment/Plan 1. Lung cancer. Metastatic. Port placement today for chemotherapy and durable venous access. Risks and benefits were discussed. 2. Diabetes. Stable on outpatient medications and blood glucose control important in reducing the progression of atherosclerotic disease. Also, involved in wound healing. On appropriate medications. 3. Hypertension. Stable on outpatient medications and blood pressure control important in reducing the progression of atherosclerotic disease. On appropriate oral medications.   Leotis Pain, MD  11/30/2020 10:25 AM

## 2020-11-30 NOTE — Discharge Instructions (Signed)
Moderate Conscious Sedation, Adult, Care After This sheet gives you information about how to care for yourself after your procedure. Your health care provider may also give you more specific instructions. If you have problems or questions, contact your health care provider. What can I expect after the procedure? After the procedure, it is common to have:  Sleepiness for several hours.  Impaired judgment for several hours.  Difficulty with balance.  Vomiting if you eat too soon. Follow these instructions at home: For the time period you were told by your health care provider:  Rest.  Do not participate in activities where you could fall or become injured.  Do not drive or use machinery.  Do not drink alcohol.  Do not take sleeping pills or medicines that cause drowsiness.  Do not make important decisions or sign legal documents.  Do not take care of children on your own.      Eating and drinking  Follow the diet recommended by your health care provider.  Drink enough fluid to keep your urine pale yellow.  If you vomit: ? Drink water, juice, or soup when you can drink without vomiting. ? Make sure you have little or no nausea before eating solid foods.   General instructions  Take over-the-counter and prescription medicines only as told by your health care provider.  Have a responsible adult stay with you for the time you are told. It is important to have someone help care for you until you are awake and alert.  Do not smoke.  Keep all follow-up visits as told by your health care provider. This is important. Contact a health care provider if:  You are still sleepy or having trouble with balance after 24 hours.  You feel light-headed.  You keep feeling nauseous or you keep vomiting.  You develop a rash.  You have a fever.  You have redness or swelling around the IV site. Get help right away if:  You have trouble breathing.  You have new-onset confusion at  home. Summary  After the procedure, it is common to feel sleepy, have impaired judgment, or feel nauseous if you eat too soon.  Rest after you get home. Know the things you should not do after the procedure.  Follow the diet recommended by your health care provider and drink enough fluid to keep your urine pale yellow.  Get help right away if you have trouble breathing or new-onset confusion at home. This information is not intended to replace advice given to you by your health care provider. Make sure you discuss any questions you have with your health care provider. Document Revised: 02/28/2020 Document Reviewed: 09/26/2019 Elsevier Patient Education  2021 [place] Insertion, Care After This sheet gives you information about how to care for yourself after your procedure. Your health care provider may also give you more specific instructions. If you have problems or questions, contact your health care provider. What can I expect after the procedure? After the procedure, it is common to have:  Discomfort at the port insertion site.  Bruising on the skin over the port. This should improve over 3-4 days. Follow these instructions at home: [facility] care  After your port is placed, you will get a manufacturer's information card. The card has information about your port. Keep this card with you at all times.  Take care of the port as told by your health care provider. Ask your health care provider if you or a family member can  get training for taking care of the port at home. A home health care nurse may also take care of the port.  Make sure to remember what type of port you have. Incision care  Follow instructions from your health care provider about how to take care of your port insertion site. Make sure you: ? Wash your hands with soap and water before and after you change your bandage (dressing). If soap and water are not available, use hand sanitizer. ? Change your  dressing as told by your health care provider. ? Leave stitches (sutures), skin glue, or adhesive strips in place. These skin closures may need to stay in place for 2 weeks or longer. If adhesive strip edges start to loosen and curl up, you may trim the loose edges. Do not remove adhesive strips completely unless your health care provider tells you to do that.  Check your port insertion site every day for signs of infection. Check for: ? Redness, swelling, or pain. ? Fluid or blood. ? Warmth. ? Pus or a bad smell.      Activity  Return to your normal activities as told by your health care provider. Ask your health care provider what activities are safe for you.  Do not lift anything that is heavier than 10 lb (4.5 kg), or the limit that you are told, until your health care provider says that it is safe. General instructions  Take over-the-counter and prescription medicines only as told by your health care provider.  Do not take baths, swim, or use a hot tub until your health care provider approves. Ask your health care provider if you may take showers. You may only be allowed to take sponge baths.  Do not drive for 24 hours if you were given a sedative during your procedure.  Wear a medical alert bracelet in case of an emergency. This will tell any health care providers that you have a port.  Keep all follow-up visits as told by your health care provider. This is important. Contact a health care provider if:  You cannot flush your port with saline as directed, or you cannot draw blood from the port.  You have a fever or chills.  You have redness, swelling, or pain around your port insertion site.  You have fluid or blood coming from your port insertion site.  Your port insertion site feels warm to the touch.  You have pus or a bad smell coming from the port insertion site. Get help right away if:  You have chest pain or shortness of breath.  You have bleeding from your port  that you cannot control. Summary  Take care of the port as told by your health care provider. Keep the manufacturer's information card with you at all times.  Change your dressing as told by your health care provider.  Contact a health care provider if you have a fever or chills or if you have redness, swelling, or pain around your port insertion site.  Keep all follow-up visits as told by your health care provider. This information is not intended to replace advice given to you by your health care provider. Make sure you discuss any questions you have with your health care provider. Document Revised: 05/29/2018 Document Reviewed: 05/29/2018 Elsevier Patient Education  [place].

## 2020-11-30 NOTE — Op Note (Signed)
      [facility]       Operative Note  Date: 11/30/2020  Preoperative diagnosis:  1. Lung cancer  Postoperative diagnosis:  Same as above  Procedures: #1. Ultrasound guidance for vascular access to the right internal jugular vein. #2. Fluoroscopic guidance for placement of catheter. #3. Placement of CT compatible Port-A-Cath, right internal jugular vein.  Surgeon: Leotis Pain, MD.   Anesthesia: Local with moderate conscious sedation for approximately 23  minutes using 2 mg of Versed and 50 mcg of Fentanyl  Fluoroscopy time: less than 1 minute  Contrast used: 0  Estimated blood loss: 50  Indication for the procedure:  The patient is a 70 y.o.female with lung cancer.  The patient needs a Port-A-Cath for durable venous access, chemotherapy, lab draws, and CT scans. We are asked to place this. Risks and benefits were discussed and informed consent was obtained.  Description of procedure: The patient was brought to the vascular and interventional radiology suite.  Moderate conscious sedation was administered throughout the procedure during a face to face encounter with the patient with my supervision of the RN administering medicines and monitoring the patient's vital signs, pulse oximetry, telemetry and mental status throughout from the start of the procedure until the patient was taken to the recovery room. The right neck chest and shoulder were sterilely prepped and draped, and a sterile surgical field was created. Ultrasound was used to help visualize a patent right internal jugular vein. This was then accessed under direct ultrasound guidance without difficulty with the Seldinger needle and a permanent image was recorded. A J-wire was placed. After skin nick and dilatation, the peel-away sheath was then placed over the wire. I then anesthetized an area under the clavicle approximately 1-2 fingerbreadths. A transverse incision was created and an inferior pocket was  created with electrocautery and blunt dissection. The port was then brought onto the field, placed into the pocket and secured to the chest wall with 2 Prolene sutures. The catheter was connected to the port and tunneled from the subclavicular incision to the access site. Fluoroscopic guidance was then used to cut the catheter to an appropriate length. The catheter was then placed through the peel-away sheath and the peel-away sheath was removed. The catheter tip was parked in excellent location under fluorocoscopic guidance in the cavoatrial junction. The pocket was then irrigated with antibiotic impregnated saline and the wound was closed with a running 3-0 Vicryl and a 4-0 Monocryl. The access incision was closed with a single 4-0 Monocryl. The Huber needle was used to withdraw blood and flush the port with heparinized saline. Dermabond was then placed as a dressing. The patient tolerated the procedure well and was taken to the recovery room in stable condition.   Leotis Pain 11/30/2020 10:56 AM   This note was created with Dragon Medical transcription system. Any errors in dictation are purely unintentional.

## 2020-12-01 ENCOUNTER — Encounter: Payer: Self-pay | Admitting: *Deleted

## 2020-12-01 ENCOUNTER — Inpatient Hospital Stay: Payer: Medicare HMO | Attending: Oncology

## 2020-12-01 ENCOUNTER — Encounter: Payer: Self-pay | Admitting: Oncology

## 2020-12-01 ENCOUNTER — Inpatient Hospital Stay (HOSPITAL_BASED_OUTPATIENT_CLINIC_OR_DEPARTMENT_OTHER): Payer: Medicare HMO | Admitting: Oncology

## 2020-12-01 ENCOUNTER — Inpatient Hospital Stay: Payer: Medicare HMO

## 2020-12-01 ENCOUNTER — Other Ambulatory Visit: Payer: Self-pay

## 2020-12-01 VITALS — BP 134/80 | HR 75 | Temp 97.2°F | Resp 16 | Wt 147.9 lb

## 2020-12-01 DIAGNOSIS — Z5112 Encounter for antineoplastic immunotherapy: Secondary | ICD-10-CM | POA: Insufficient documentation

## 2020-12-01 DIAGNOSIS — C3412 Malignant neoplasm of upper lobe, left bronchus or lung: Secondary | ICD-10-CM | POA: Insufficient documentation

## 2020-12-01 DIAGNOSIS — C7931 Secondary malignant neoplasm of brain: Secondary | ICD-10-CM | POA: Insufficient documentation

## 2020-12-01 DIAGNOSIS — Z79899 Other long term (current) drug therapy: Secondary | ICD-10-CM | POA: Diagnosis not present

## 2020-12-01 DIAGNOSIS — I700 Atherosclerosis of aorta: Secondary | ICD-10-CM | POA: Insufficient documentation

## 2020-12-01 DIAGNOSIS — R5383 Other fatigue: Secondary | ICD-10-CM | POA: Insufficient documentation

## 2020-12-01 DIAGNOSIS — Z8349 Family history of other endocrine, nutritional and metabolic diseases: Secondary | ICD-10-CM | POA: Diagnosis not present

## 2020-12-01 DIAGNOSIS — R072 Precordial pain: Secondary | ICD-10-CM | POA: Diagnosis not present

## 2020-12-01 DIAGNOSIS — Z5111 Encounter for antineoplastic chemotherapy: Secondary | ICD-10-CM

## 2020-12-01 DIAGNOSIS — C787 Secondary malignant neoplasm of liver and intrahepatic bile duct: Secondary | ICD-10-CM | POA: Insufficient documentation

## 2020-12-01 DIAGNOSIS — Z5189 Encounter for other specified aftercare: Secondary | ICD-10-CM | POA: Insufficient documentation

## 2020-12-01 DIAGNOSIS — Z803 Family history of malignant neoplasm of breast: Secondary | ICD-10-CM | POA: Diagnosis not present

## 2020-12-01 DIAGNOSIS — C778 Secondary and unspecified malignant neoplasm of lymph nodes of multiple regions: Secondary | ICD-10-CM | POA: Insufficient documentation

## 2020-12-01 DIAGNOSIS — C3490 Malignant neoplasm of unspecified part of unspecified bronchus or lung: Secondary | ICD-10-CM

## 2020-12-01 DIAGNOSIS — Z8249 Family history of ischemic heart disease and other diseases of the circulatory system: Secondary | ICD-10-CM | POA: Insufficient documentation

## 2020-12-01 LAB — CBC WITH DIFFERENTIAL/PLATELET
Abs Immature Granulocytes: 0 10*3/uL (ref 0.00–0.07)
Basophils Absolute: 0 10*3/uL (ref 0.0–0.1)
Basophils Relative: 0 %
Eosinophils Absolute: 0 10*3/uL (ref 0.0–0.5)
Eosinophils Relative: 0 %
HCT: 34.6 % — ABNORMAL LOW (ref 36.0–46.0)
Hemoglobin: 11.8 g/dL — ABNORMAL LOW (ref 12.0–15.0)
Immature Granulocytes: 0 %
Lymphocytes Relative: 27 %
Lymphs Abs: 1.1 10*3/uL (ref 0.7–4.0)
MCH: 28.9 pg (ref 26.0–34.0)
MCHC: 34.1 g/dL (ref 30.0–36.0)
MCV: 84.8 fL (ref 80.0–100.0)
Monocytes Absolute: 0.4 10*3/uL (ref 0.1–1.0)
Monocytes Relative: 10 %
Neutro Abs: 2.5 10*3/uL (ref 1.7–7.7)
Neutrophils Relative %: 63 %
Platelets: 336 10*3/uL (ref 150–400)
RBC: 4.08 MIL/uL (ref 3.87–5.11)
RDW: 13.4 % (ref 11.5–15.5)
WBC: 4.1 10*3/uL (ref 4.0–10.5)
nRBC: 0 % (ref 0.0–0.2)

## 2020-12-01 LAB — COMPREHENSIVE METABOLIC PANEL
ALT: 11 U/L (ref 0–44)
AST: 16 U/L (ref 15–41)
Albumin: 4 g/dL (ref 3.5–5.0)
Alkaline Phosphatase: 34 U/L — ABNORMAL LOW (ref 38–126)
Anion gap: 8 (ref 5–15)
BUN: 9 mg/dL (ref 8–23)
CO2: 27 mmol/L (ref 22–32)
Calcium: 9.7 mg/dL (ref 8.9–10.3)
Chloride: 103 mmol/L (ref 98–111)
Creatinine, Ser: 0.53 mg/dL (ref 0.44–1.00)
GFR, Estimated: >60 mL/min (ref >60)
Glucose, Bld: 113 mg/dL — ABNORMAL HIGH (ref 70–99)
Potassium: 3.1 mmol/L — ABNORMAL LOW (ref 3.5–5.1)
Sodium: 138 mmol/L (ref 135–145)
Total Bilirubin: 0.8 mg/dL (ref 0.3–1.2)
Total Protein: 7.1 g/dL (ref 6.5–8.1)

## 2020-12-01 LAB — TSH: TSH: 0.781 u[IU]/mL (ref 0.350–4.500)

## 2020-12-01 MED ORDER — SODIUM CHLORIDE 0.9 % IV SOLN
400.5000 mg | Freq: Once | INTRAVENOUS | Status: AC
  Administered 2020-12-01: 400 mg via INTRAVENOUS
  Filled 2020-12-01: qty 40

## 2020-12-01 MED ORDER — PALONOSETRON HCL INJECTION 0.25 MG/5ML
0.2500 mg | Freq: Once | INTRAVENOUS | Status: AC
  Administered 2020-12-01: 0.25 mg via INTRAVENOUS
  Filled 2020-12-01: qty 5

## 2020-12-01 MED ORDER — SODIUM CHLORIDE 0.9 % IV SOLN
1200.0000 mg | Freq: Once | INTRAVENOUS | Status: AC
  Administered 2020-12-01: 1200 mg via INTRAVENOUS
  Filled 2020-12-01: qty 20

## 2020-12-01 MED ORDER — SODIUM CHLORIDE 0.9 % IV SOLN
100.0000 mg/m2 | Freq: Once | INTRAVENOUS | Status: AC
  Administered 2020-12-01: 180 mg via INTRAVENOUS
  Filled 2020-12-01: qty 9

## 2020-12-01 MED ORDER — SODIUM CHLORIDE 0.9 % IV SOLN
Freq: Once | INTRAVENOUS | Status: AC
  Filled 2020-12-01: qty 250

## 2020-12-01 MED ORDER — SODIUM CHLORIDE 0.9 % IV SOLN: 584.5000 mg | Freq: Once | INTRAVENOUS | Status: DC

## 2020-12-01 MED ORDER — SODIUM CHLORIDE 0.9 % IV SOLN
10.0000 mg | Freq: Once | INTRAVENOUS | Status: AC
  Administered 2020-12-01: 10 mg via INTRAVENOUS
  Filled 2020-12-01: qty 10

## 2020-12-01 MED ORDER — HEPARIN SOD (PORK) LOCK FLUSH 100 UNIT/ML IV SOLN
500.0000 [IU] | Freq: Once | INTRAVENOUS | Status: AC | PRN
  Administered 2020-12-01: 500 [IU]
  Filled 2020-12-01: qty 5

## 2020-12-01 MED ORDER — SODIUM CHLORIDE 0.9 % IV SOLN
150.0000 mg | Freq: Once | INTRAVENOUS | Status: AC
  Administered 2020-12-01: 150 mg via INTRAVENOUS
  Filled 2020-12-01: qty 150

## 2020-12-01 NOTE — Progress Notes (Signed)
Pt stable at discharge.  

## 2020-12-01 NOTE — Progress Notes (Signed)
  Oncology Nurse Navigator Documentation  Navigator Location: [facility] (12/01/20 1300)   )Navigator Encounter Type: Appt/Treatment Plan Review (12/01/20 1300)                   Treatment Initiated Date: 12/01/20 (12/01/20 1300) Patient Visit Type: MedOnc (12/01/20 1300) Treatment Phase: First Chemo Tx (12/01/20 1300) Barriers/Navigation Needs: No Barriers At This Time (12/01/20 1300)   Interventions: None Required (12/01/20 1300)           Appt/treatment plan review. Pt started chemotherapy today. Spoke with pt by phone yesterday and answered all questions prior to starting chemo. Nothing further needed at this time. Will follow up with patient at a later date.            Time Spent with Patient: 30 (12/01/20 1300)

## 2020-12-01 NOTE — Progress Notes (Signed)
Hematology/Oncology Consult note [facility]  Telephone:([phone] [phone]  Patient Care Team: Leonel Ramsay, MD as PCP - General (Infectious Diseases) Telford Nab, RN as Oncology Nurse Navigator   Name of the patient: Doris Johnson  [id number]  1951-07-10   Date of visit: 12/01/20  Diagnosis-extensive stage small cell lung cancer with liver and brain metastases  Chief complaint/ Reason for visit-on treatment assessment prior to cycle 1 of carbo etoposide Tecentriq chemotherapy  Heme/Onc history: Patient is a 70 year old female with a remote history of smoking in her teenage years and exposure to passive smoking.  She has been having ongoing midsternal pain which has been growing since the last 6 to 7 months.  She had been to urgent care as well in the past.  She finally had a CT chest with contrast done in October 2021 which showed a large mass in the left side of the mediastinum measuring 6.7 x 6.2 x 6.1 cm causing severe compression of the left main pulmonary artery and around the aortic arch in the left subclavian artery.  Enlarged thyroid gland.  Left upper lobe nodule 1 x 0.7 cm.  She then underwent bronchoscopy with Dr.Aleskerov left upper lobe biopsy was nondiagnostic.  However station 4, 7 and 10 L lymph nodes were consistent with metastatic small cell carcinoma.   Interval history-patient reports ongoing fatigue and occasional retrosternal chest pain which has been chronic.  Denies any new complaints.  She has lost 4 pounds since her last visit  ECOG PS- 1 Pain scale- 2 Opioid associated constipation- no  Review of systems- Review of Systems  Constitutional: Positive for malaise/fatigue. Negative for chills, fever and weight loss.  HENT: Negative for congestion, ear discharge and nosebleeds.   Eyes: Negative for blurred vision.  Respiratory: Negative for cough, hemoptysis, sputum production, shortness of breath and wheezing.    Cardiovascular: Negative for chest pain, palpitations, orthopnea and claudication.  Gastrointestinal: Negative for abdominal pain, blood in stool, constipation, diarrhea, heartburn, melena, nausea and vomiting.  Genitourinary: Negative for dysuria, flank pain, frequency, hematuria and urgency.  Musculoskeletal: Negative for back pain, joint pain and myalgias.  Skin: Negative for rash.  Neurological: Negative for dizziness, tingling, focal weakness, seizures, weakness and headaches.  Endo/Heme/Allergies: Does not bruise/bleed easily.  Psychiatric/Behavioral: Negative for depression and suicidal ideas. The patient does not have insomnia.       No Known Allergies   Past Medical History:  Diagnosis Date  . Cataract   . Diabetes mellitus without complication ([place])   . Hyperlipidemia   . Hypertension   . Hypothyroidism    doctor's keeping an eye on thyroid      Past Surgical History:  Procedure Laterality Date  . ABDOMINAL HYSTERECTOMY    . PORTA CATH INSERTION N/A 11/30/2020   Procedure: PORTA CATH INSERTION;  Surgeon: Algernon Huxley, MD;  Location: [place] [facility];  Service: Cardiovascular;  Laterality: N/A;  . VIDEO BRONCHOSCOPY WITH ENDOBRONCHIAL NAVIGATION N/A 10/21/2020   Procedure: VIDEO BRONCHOSCOPY WITH ENDOBRONCHIAL NAVIGATION;  Surgeon: Ottie Glazier, MD;  Location: [facility];  Service: Thoracic;  Laterality: N/A;  . VIDEO BRONCHOSCOPY WITH ENDOBRONCHIAL ULTRASOUND N/A 10/21/2020   Procedure: VIDEO BRONCHOSCOPY WITH ENDOBRONCHIAL ULTRASOUND;  Surgeon: Ottie Glazier, MD;  Location: [facility];  Service: Thoracic;  Laterality: N/A;    Social History   Socioeconomic History  . Marital status: Single    Spouse name: Not on file  . Number of children: Not on file  .  Years of education: Not on file  . Highest education level: Not on file  Occupational History  . Not on file  Tobacco Use  . Smoking status: Never Smoker  . Smokeless tobacco: Never Used  Substance and  Sexual Activity  . Alcohol use: No    Comment: occasional  . Drug use: No  . Sexual activity: Not on file  Other Topics Concern  . Not on file  Social History Narrative  . Not on file   Social Determinants of Health   Financial Resource Strain: Not on file  Food Insecurity: Not on file  Transportation Needs: Not on file  Physical Activity: Not on file  Stress: Not on file  Social Connections: Not on file  Intimate Partner Violence: Not on file    Family History  Problem Relation Age of Onset  . Cancer Mother        breast  . Hypertension Mother   . Breast cancer Mother 31  . Heart disease Father   . Hyperlipidemia Brother   . Heart disease Brother      Current Outpatient Medications:  .  aspirin 325 MG tablet, Take 325 mg by mouth daily. , Disp: , Rfl:  .  docusate sodium (COLACE) 100 MG capsule, Take 200 mg by mouth at bedtime., Disp: , Rfl:  .  DUREZOL 0.05 % EMUL, Place 1 drop into the right eye 3 (three) times daily., Disp: , Rfl:  .  fluticasone (FLONASE) 50 MCG/ACT nasal spray, Place 2 sprays into both nostrils daily. (Patient taking differently: Place 2 sprays into both nostrils daily as needed for allergies.), Disp: 16 g, Rfl: 5 .  lidocaine-prilocaine (EMLA) cream, Apply 1 application topically as needed. Apply small amount to port site at least 1 hour prior to it being accessed, cover with plastic wrap, Disp: 30 g, Rfl: 1 .  lisinopril (PRINIVIL,ZESTRIL) 20 MG tablet, TAKE 1 TABLET BY MOUTH EVERY DAY (Patient taking differently: Take 20 mg by mouth every evening.), Disp: 30 tablet, Rfl: 0 .  LORazepam (ATIVAN) 0.5 MG tablet, Take 1 tablet (0.5 mg total) by mouth every 6 (six) hours as needed (Nausea or vomiting)., Disp: 30 tablet, Rfl: 0 .  metFORMIN (GLUCOPHAGE) 500 MG tablet, Take 1,000 mg by mouth 2 (two) times daily with a meal. , Disp: , Rfl:  .  ondansetron (ZOFRAN) 8 MG tablet, Take 1 tablet (8 mg total) by mouth 2 (two) times daily as needed for refractory  nausea / vomiting. Start on day 3 after carboplatin chemo., Disp: 30 tablet, Rfl: 1 .  oxyCODONE (OXY IR/ROXICODONE) 5 MG immediate release tablet, Take 1 tablet (5 mg total) by mouth 3 (three) times daily as needed for severe pain., Disp: 45 tablet, Rfl: 0 .  potassium chloride (KLOR-CON) 10 MEQ tablet, Take 10 mEq by mouth daily., Disp: , Rfl:  .  prochlorperazine (COMPAZINE) 10 MG tablet, Take 1 tablet (10 mg total) by mouth every 6 (six) hours as needed (Nausea or vomiting)., Disp: 30 tablet, Rfl: 1 .  rosuvastatin (CRESTOR) 10 MG tablet, Take 10 mg by mouth every evening. , Disp: , Rfl:  .  triamterene-hydrochlorothiazide (DYAZIDE) 37.5-25 MG capsule, TAKE ONE CAPSULE BY MOUTH DAILY (Patient taking differently: Take 1 capsule by mouth daily.), Disp: 30 capsule, Rfl: 0  Physical exam:  Vitals:   12/01/20 1028  BP: 134/80  Pulse: 75  Resp: 16  Temp: (!) 97.2 F (36.2 C)  TempSrc: Tympanic  SpO2: 100%  Weight: 147 lb 14.4  oz (67.1 kg)   Physical Exam Constitutional:      General: She is not in acute distress. HENT:     Head: Normocephalic.  Eyes:     Extraocular Movements: EOM normal.     Pupils: Pupils are equal, round, and reactive to light.  Cardiovascular:     Rate and Rhythm: Normal rate and regular rhythm.     Heart sounds: Normal heart sounds.  Pulmonary:     Effort: Pulmonary effort is normal.     Breath sounds: Normal breath sounds.  Abdominal:     General: Bowel sounds are normal.     Palpations: Abdomen is soft.  Musculoskeletal:     Cervical back: Normal range of motion.  Skin:    General: Skin is warm and dry.  Neurological:     Mental Status: She is alert and oriented to person, place, and time.      CMP Latest Ref Rng & Units 10/21/2020  Glucose 70 - 99 mg/dL 99  BUN 8 - 23 mg/dL 9  Creatinine 0.44 - 1.00 mg/dL 0.50  Sodium 135 - 145 mmol/L 142  Potassium 3.5 - 5.1 mmol/L 2.9(L)  Chloride 98 - 111 mmol/L 105  Alkaline Phos 25 - 125 U/L -  AST 13 -  35 U/L -  ALT 7 - 35 U/L -   CBC Latest Ref Rng & Units 10/21/2020  WBC 4.0 - 10.5 K/uL -  Hemoglobin 12.0 - 15.0 g/dL 13.3  Hematocrit 36.0 - 46.0 % 39.0  Platelets 150 - 400 K/uL -    No images are attached to the encounter.  MR Brain W Wo Contrast  Result Date: 11/09/2020 CLINICAL DATA:  Small-cell lung cancer, staging. EXAM: MRI HEAD WITHOUT AND WITH CONTRAST TECHNIQUE: Multiplanar, multiecho pulse sequences of the brain and surrounding structures were obtained without and with intravenous contrast. CONTRAST:  49mL GADAVIST GADOBUTROL 1 MMOL/ML IV SOLN COMPARISON:  None. FINDINGS: Brain: No acute infarction, hemorrhage, hydrocephalus or extra-axial collection. A 1-2 mm focus of contrast enhancement in the right cerebellar hemisphere (series 18, image 54) and a 2-3 mm focus of contrast enhancement in the right temporal lobe (series 18, image 68) without associated mass effect or edema are concerning metastatic lesions. A few scattered foci of T2 hyperintensity are seen within the white matter of the cerebral hemispheres, nonspecific. Vascular: Normal flow voids. Skull and upper cervical spine: Normal marrow signal. Sinuses/Orbits: Negative. IMPRESSION: 1. Two small foci of contrast enhancement in the right cerebellar hemisphere and in the right temporal lobe without associated mass effect or edema are concerning for metastatic lesions. 2. A few scattered foci of T2 hyperintensity within the white matter of the cerebral hemispheres, nonspecific. Electronically Signed   By: Pedro Earls M.D.   On: 11/09/2020 19:40   PERIPHERAL VASCULAR CATHETERIZATION  Result Date: 11/30/2020 See op note  NM PET Image Initial (PI) Skull Base To Thigh  Result Date: 11/11/2020 CLINICAL DATA:  Initial treatment strategy for biopsy-proven small cell left lung cancer. EXAM: NUCLEAR MEDICINE PET SKULL BASE TO THIGH TECHNIQUE: 8.1 mCi F-18 FDG was injected intravenously. Full-ring PET imaging was  performed from the skull base to thigh after the radiotracer. CT data was obtained and used for attenuation correction and anatomic localization. Fasting blood glucose: 114 mg/dl COMPARISON:  09/03/2020 and 09/29/2020 chest CT studies. FINDINGS: Mediastinal blood pool activity: SUV max 2.2 Liver activity: SUV max NA NECK: Enlarged hypermetabolic 2.2 cm left level 3 neck node with max SUV  9.7 (series 3/image 45). No additional enlarged or hypermetabolic lymph nodes in the neck. Incidental CT findings: Enlarged heterogeneous thyroid gland without hypermetabolic or discrete thyroid nodules. CHEST: Intensely hypermetabolic locally infiltrative 7.3 x 6.3 cm solid mass centered in the AP window of the left mediastinum with max SUV 11.3 (series 3/image 75), contiguous with the upper left hilum and left prevascular nodal chain. No enlarged or hypermetabolic axillary nodes. No hypermetabolic right hilar or right mediastinal nodes. No separate hypermetabolic pulmonary findings. Apical left upper lobe 0.9 cm solid pulmonary nodule demonstrates low level uptake with max SUV 2.1 (series 3/image 62). Incidental CT findings: Minimally atherosclerotic nonaneurysmal thoracic aorta. ABDOMEN/PELVIS: Hypermetabolic solid 5.7 cm segment 4B left liver mass with max SUV 10.7 (series 3/image 126). No additional hypermetabolic liver masses. No abnormal hypermetabolic activity within the pancreas, adrenal glands, or spleen. No hypermetabolic lymph nodes in the abdomen or pelvis. Incidental CT findings: Minimally atherosclerotic nonaneurysmal abdominal aorta. Hysterectomy. SKELETON: No focal hypermetabolic activity to suggest skeletal metastasis. Incidental CT findings: none IMPRESSION: 1. Extensive stage hypermetabolic small cell left lung cancer involving the AP window, left neck and liver as detailed. 2. Heterogeneous goiter without hypermetabolic or discrete thyroid nodules. 3. Aortic Atherosclerosis (ICD10-I70.0). Electronically Signed    By: Ilona Sorrel M.D.   On: 11/11/2020 13:41     Assessment and plan- Patient is a 70 y.o. female with extensive stage small cell lung cancer stage IV T1 N2 M1 with liver and brain metastases Here for on treatment assessment prior to cycle 1 of carbo etoposide Tecentriq chemotherapy  Discussed the results of PET CT scan with the patient which showed enlarged level 3 cervical lymph node 2.2 cm that was hypermetabolic with an SUV of 9.7.  Large central 7.3 cm AP window mass.  5.7 cm left liver mass.  MRI brain with and without contrast showed 2 small foci of enhancement in the right cerebellar hemisphere and right temporal lobe without mass-effect or edema as well concerning for brain metastases  Discussed with the patient that she has extensive stage small cell lung cancer and therefore treatment would be palliative and not curative.  Recommend 4 cycles of carboplatin  on day 1 and etoposide on day 1 2 and 3 along with Tecentriq  IV every 3 weeks.  Plan to repeat scans after 2 cycles and 4 cycles.  If she has good response to treatment after 4 cycles she will continue with maintenance Tecentriq until progression or toxicity.  Patient also found to have 2 small foci of brain metastases which were 2 to 3 mm in size.  She is asymptomatic at this time with no significant mass-effect or edema.  I will therefore hold off on upfront whole brain radiation at this time.  We will plan for whole brain radiation after she completes 4 cycles of systemic chemoimmunotherapy.  Again discussed risks and benefits of chemotherapy including all but not limited to nausea, vomiting, low blood counts, risk of infections and hospitalization.  Risk of autoimmune side effects associated with Tecentriq including all but not limited to colitis, pneumonitis, thyroiditis and need to monitor kidney and liver functions.  Treatment will be given with a palliative intent.  She will proceed with cycle 1 today of carboplatin and etoposide  followed by etoposide tomorrow and day after with on for Neulasta on day 3 as well.  And I will see her back in 10 days with repeat labs for possible fluids   Visit Diagnosis 1. Encounter for antineoplastic chemotherapy  2. Encounter for antineoplastic immunotherapy   3. Small cell lung cancer ([place])      Dr. Randa Evens, MD, MPH [facility] at [facility] [phone] 12/01/2020 9:54 AM

## 2020-12-02 ENCOUNTER — Inpatient Hospital Stay: Payer: Medicare HMO

## 2020-12-02 ENCOUNTER — Telehealth: Payer: Self-pay

## 2020-12-02 ENCOUNTER — Inpatient Hospital Stay (HOSPITAL_BASED_OUTPATIENT_CLINIC_OR_DEPARTMENT_OTHER): Payer: Medicare HMO | Admitting: Hospice and Palliative Medicine

## 2020-12-02 ENCOUNTER — Other Ambulatory Visit: Payer: Self-pay | Admitting: *Deleted

## 2020-12-02 VITALS — BP 133/72 | HR 63 | Temp 97.5°F | Resp 16

## 2020-12-02 DIAGNOSIS — C3490 Malignant neoplasm of unspecified part of unspecified bronchus or lung: Secondary | ICD-10-CM

## 2020-12-02 DIAGNOSIS — Z5112 Encounter for antineoplastic immunotherapy: Secondary | ICD-10-CM | POA: Diagnosis not present

## 2020-12-02 LAB — T4: T4, Total: 7.3 ug/dL (ref 4.5–12.0)

## 2020-12-02 MED ORDER — HEPARIN SOD (PORK) LOCK FLUSH 100 UNIT/ML IV SOLN
500.0000 [IU] | Freq: Once | INTRAVENOUS | Status: AC | PRN
  Administered 2020-12-02: 500 [IU]
  Filled 2020-12-02: qty 5

## 2020-12-02 MED ORDER — HEPARIN SOD (PORK) LOCK FLUSH 100 UNIT/ML IV SOLN
INTRAVENOUS | Status: AC
  Filled 2020-12-02: qty 5

## 2020-12-02 MED ORDER — SODIUM CHLORIDE 0.9 % IV SOLN
Freq: Once | INTRAVENOUS | Status: AC
  Filled 2020-12-02: qty 250

## 2020-12-02 MED ORDER — SODIUM CHLORIDE 0.9 % IV SOLN
10.0000 mg | Freq: Once | INTRAVENOUS | Status: AC
  Administered 2020-12-02: 10 mg via INTRAVENOUS
  Filled 2020-12-02: qty 10

## 2020-12-02 MED ORDER — SODIUM CHLORIDE 0.9 % IV SOLN
100.0000 mg/m2 | Freq: Once | INTRAVENOUS | Status: AC
  Administered 2020-12-02: 180 mg via INTRAVENOUS
  Filled 2020-12-02: qty 9

## 2020-12-02 NOTE — Progress Notes (Signed)
Multidisciplinary Oncology Council Documentation  Doris Johnson was presented by our [facility] on 12/02/2020, which included representatives from:  . Palliative Care . Dietitian . Physical/Occupational Therapist . Speech Therapist . Nurse Navigator . Genetics . Social work . Hotel manager . Research RN . [facility] . Survivorship Nurse  Doris Johnson currently presents with history of extensive stage SCLC  We reviewed previous medical and familial history, history of present illness, and recent lab results along with all available histopathologic and imaging studies. The [place] considered available treatment options and made the following recommendations/referrals:  Patient being followed by RD. Consider palliative care consults.   The MOC is a meeting of clinicians from various specialty areas who evaluate and discuss patients for whom a multidisciplinary approach is being considered. Final determinations in the plan of care are those of the provider(s).   Today's extended care, comprehensive team conference, Doris Johnson was not present for the discussion and was not examined.

## 2020-12-02 NOTE — Telephone Encounter (Signed)
Telephone call to patient for follow up after receiving first infusion.   No answer but left message stating we were calling to check on them.  Encouraged patient to call for any questions or concerns.   

## 2020-12-02 NOTE — Progress Notes (Signed)
Patient tolerated infusion well. Patient and VSS. Discharged home  

## 2020-12-03 ENCOUNTER — Inpatient Hospital Stay: Payer: Medicare HMO

## 2020-12-03 VITALS — BP 131/71 | HR 72 | Temp 96.3°F | Resp 20

## 2020-12-03 DIAGNOSIS — Z5112 Encounter for antineoplastic immunotherapy: Secondary | ICD-10-CM | POA: Diagnosis not present

## 2020-12-03 DIAGNOSIS — C3490 Malignant neoplasm of unspecified part of unspecified bronchus or lung: Secondary | ICD-10-CM

## 2020-12-03 MED ORDER — HEPARIN SOD (PORK) LOCK FLUSH 100 UNIT/ML IV SOLN
INTRAVENOUS | Status: AC
  Filled 2020-12-03: qty 5

## 2020-12-03 MED ORDER — SODIUM CHLORIDE 0.9 % IV SOLN
Freq: Once | INTRAVENOUS | Status: AC
  Filled 2020-12-03: qty 250

## 2020-12-03 MED ORDER — SODIUM CHLORIDE 0.9 % IV SOLN
100.0000 mg/m2 | Freq: Once | INTRAVENOUS | Status: AC
  Administered 2020-12-03: 180 mg via INTRAVENOUS
  Filled 2020-12-03: qty 9

## 2020-12-03 MED ORDER — PEGFILGRASTIM 6 MG/0.6ML ~~LOC~~ PSKT
6.0000 mg | PREFILLED_SYRINGE | Freq: Once | SUBCUTANEOUS | Status: AC
  Administered 2020-12-03: 6 mg via SUBCUTANEOUS
  Filled 2020-12-03: qty 0.6

## 2020-12-03 MED ORDER — HEPARIN SOD (PORK) LOCK FLUSH 100 UNIT/ML IV SOLN
500.0000 [IU] | Freq: Once | INTRAVENOUS | Status: AC | PRN
  Administered 2020-12-03: 500 [IU]
  Filled 2020-12-03: qty 5

## 2020-12-03 MED ORDER — SODIUM CHLORIDE 0.9 % IV SOLN
10.0000 mg | Freq: Once | INTRAVENOUS | Status: AC
  Administered 2020-12-03: 10 mg via INTRAVENOUS
  Filled 2020-12-03: qty 10

## 2020-12-03 MED ORDER — SODIUM CHLORIDE 0.9% FLUSH
10.0000 mL | INTRAVENOUS | Status: DC | PRN
  Administered 2020-12-03: 10 mL
  Filled 2020-12-03: qty 10

## 2020-12-03 NOTE — Progress Notes (Signed)
Stable at discharge 

## 2020-12-08 ENCOUNTER — Other Ambulatory Visit: Payer: Self-pay | Admitting: *Deleted

## 2020-12-08 DIAGNOSIS — C3490 Malignant neoplasm of unspecified part of unspecified bronchus or lung: Secondary | ICD-10-CM

## 2020-12-08 MED ORDER — OXYCODONE HCL 5 MG PO TABS: 5.0000 mg | ORAL_TABLET | Freq: Three times a day (TID) | ORAL | 0 refills | Status: DC | PRN

## 2020-12-14 ENCOUNTER — Inpatient Hospital Stay: Payer: Medicare HMO

## 2020-12-14 NOTE — Progress Notes (Signed)
Nutrition Assessment   Reason for Assessment:  Weight loss, poor appetite   ASSESSMENT:  70 year old female with small cell lung cancer with mets to liver and brain. Past medical history of DM, HTN, HLD.  Patient receiving carboplatin, etoposide and tecentriq.    Met with patient in clinic today.  Patient reports that her appetite has been down for "ahwile".  Can't describe a typical day of intake.  Eats fruit usually in the am.  Reports that she takes a few bites and then does not want anymore.  Describes taste alterations, some nausea and constipation.  Has tried oral nutrition supplements and able to drink them but not crazy about them.    Medications: colace, glucophage, KCL, metformin zofran, compazine   Labs: reviewed   Anthropometrics:   Height: 66 inches Weight: 147 lb on 1/8 156 09/2020 230s in 2017 BMI: 23  6% weight loss in the last 2 months, significant  Estimated Energy Needs  Kcals: 3893-7342 Protein: 100-117 g Fluid: 2 L   NUTRITION DIAGNOSIS: Inadequate oral intake related to cancer related treatment side effects as evidenced by 6% weight loss in the last 2 months and decreased appetite     INTERVENTION:  Encouraged small frequent meals Patient to speak with MD regarding bowel regimen tomorrow other than stool softners.  Encouraged increased fluid to help with constipation Complimentary case of ensure plus given to patient.  Discussed taste change and handout provided. Contact information provided   MONITORING, EVALUATION, GOAL: weight trends, intake   Next Visit: Feb 10 (Thursday) during infusion  Doris Johnson, [place], [place] Registered Dietitian [phone] (mobile)

## 2020-12-15 ENCOUNTER — Inpatient Hospital Stay: Payer: Medicare HMO | Attending: Oncology

## 2020-12-15 ENCOUNTER — Inpatient Hospital Stay: Payer: Medicare HMO

## 2020-12-15 ENCOUNTER — Inpatient Hospital Stay (HOSPITAL_BASED_OUTPATIENT_CLINIC_OR_DEPARTMENT_OTHER): Payer: Medicare HMO | Admitting: Oncology

## 2020-12-15 ENCOUNTER — Other Ambulatory Visit: Payer: Self-pay

## 2020-12-15 ENCOUNTER — Encounter: Payer: Self-pay | Admitting: Oncology

## 2020-12-15 ENCOUNTER — Inpatient Hospital Stay (HOSPITAL_BASED_OUTPATIENT_CLINIC_OR_DEPARTMENT_OTHER): Payer: Medicare HMO | Admitting: Hospice and Palliative Medicine

## 2020-12-15 ENCOUNTER — Other Ambulatory Visit: Payer: Self-pay | Admitting: *Deleted

## 2020-12-15 VITALS — BP 130/73 | HR 56 | Temp 97.4°F | Resp 16

## 2020-12-15 VITALS — BP 137/70 | HR 64 | Temp 97.1°F | Resp 16 | Wt 139.0 lb

## 2020-12-15 DIAGNOSIS — C3490 Malignant neoplasm of unspecified part of unspecified bronchus or lung: Secondary | ICD-10-CM

## 2020-12-15 DIAGNOSIS — Z8349 Family history of other endocrine, nutritional and metabolic diseases: Secondary | ICD-10-CM | POA: Diagnosis not present

## 2020-12-15 DIAGNOSIS — Z5112 Encounter for antineoplastic immunotherapy: Secondary | ICD-10-CM | POA: Diagnosis present

## 2020-12-15 DIAGNOSIS — G893 Neoplasm related pain (acute) (chronic): Secondary | ICD-10-CM | POA: Diagnosis not present

## 2020-12-15 DIAGNOSIS — C779 Secondary and unspecified malignant neoplasm of lymph node, unspecified: Secondary | ICD-10-CM | POA: Diagnosis not present

## 2020-12-15 DIAGNOSIS — E876 Hypokalemia: Secondary | ICD-10-CM | POA: Insufficient documentation

## 2020-12-15 DIAGNOSIS — E860 Dehydration: Secondary | ICD-10-CM

## 2020-12-15 DIAGNOSIS — Z515 Encounter for palliative care: Secondary | ICD-10-CM

## 2020-12-15 DIAGNOSIS — T402X5A Adverse effect of other opioids, initial encounter: Secondary | ICD-10-CM | POA: Insufficient documentation

## 2020-12-15 DIAGNOSIS — Z5111 Encounter for antineoplastic chemotherapy: Secondary | ICD-10-CM | POA: Diagnosis present

## 2020-12-15 DIAGNOSIS — C787 Secondary malignant neoplasm of liver and intrahepatic bile duct: Secondary | ICD-10-CM | POA: Insufficient documentation

## 2020-12-15 DIAGNOSIS — R5383 Other fatigue: Secondary | ICD-10-CM | POA: Diagnosis not present

## 2020-12-15 DIAGNOSIS — Z803 Family history of malignant neoplasm of breast: Secondary | ICD-10-CM | POA: Insufficient documentation

## 2020-12-15 DIAGNOSIS — C3412 Malignant neoplasm of upper lobe, left bronchus or lung: Secondary | ICD-10-CM | POA: Diagnosis present

## 2020-12-15 DIAGNOSIS — R0789 Other chest pain: Secondary | ICD-10-CM | POA: Diagnosis not present

## 2020-12-15 DIAGNOSIS — Z79899 Other long term (current) drug therapy: Secondary | ICD-10-CM | POA: Diagnosis not present

## 2020-12-15 DIAGNOSIS — Z8249 Family history of ischemic heart disease and other diseases of the circulatory system: Secondary | ICD-10-CM | POA: Diagnosis not present

## 2020-12-15 DIAGNOSIS — E049 Nontoxic goiter, unspecified: Secondary | ICD-10-CM | POA: Diagnosis not present

## 2020-12-15 DIAGNOSIS — C7931 Secondary malignant neoplasm of brain: Secondary | ICD-10-CM | POA: Insufficient documentation

## 2020-12-15 DIAGNOSIS — K5903 Drug induced constipation: Secondary | ICD-10-CM | POA: Insufficient documentation

## 2020-12-15 DIAGNOSIS — Z5189 Encounter for other specified aftercare: Secondary | ICD-10-CM | POA: Diagnosis not present

## 2020-12-15 LAB — CBC WITH DIFFERENTIAL/PLATELET
Abs Immature Granulocytes: 0.88 10*3/uL — ABNORMAL HIGH (ref 0.00–0.07)
Basophils Absolute: 0.1 10*3/uL (ref 0.0–0.1)
Basophils Relative: 1 %
Eosinophils Absolute: 0 10*3/uL (ref 0.0–0.5)
Eosinophils Relative: 0 %
HCT: 31.9 % — ABNORMAL LOW (ref 36.0–46.0)
Hemoglobin: 10.9 g/dL — ABNORMAL LOW (ref 12.0–15.0)
Immature Granulocytes: 7 %
Lymphocytes Relative: 13 %
Lymphs Abs: 1.7 10*3/uL (ref 0.7–4.0)
MCH: 29.1 pg (ref 26.0–34.0)
MCHC: 34.2 g/dL (ref 30.0–36.0)
MCV: 85.3 fL (ref 80.0–100.0)
Monocytes Absolute: 0.8 10*3/uL (ref 0.1–1.0)
Monocytes Relative: 6 %
Neutro Abs: 9.1 10*3/uL — ABNORMAL HIGH (ref 1.7–7.7)
Neutrophils Relative %: 73 %
Platelets: 127 10*3/uL — ABNORMAL LOW (ref 150–400)
RBC: 3.74 MIL/uL — ABNORMAL LOW (ref 3.87–5.11)
RDW: 14.7 % (ref 11.5–15.5)
Smear Review: NORMAL
WBC: 12.5 10*3/uL — ABNORMAL HIGH (ref 4.0–10.5)
nRBC: 0.2 % (ref 0.0–0.2)

## 2020-12-15 LAB — COMPREHENSIVE METABOLIC PANEL
ALT: 10 U/L (ref 0–44)
AST: 15 U/L (ref 15–41)
Albumin: 4.1 g/dL (ref 3.5–5.0)
Alkaline Phosphatase: 76 U/L (ref 38–126)
Anion gap: 10 (ref 5–15)
BUN: 12 mg/dL (ref 8–23)
CO2: 28 mmol/L (ref 22–32)
Calcium: 9.5 mg/dL (ref 8.9–10.3)
Chloride: 100 mmol/L (ref 98–111)
Creatinine, Ser: 0.52 mg/dL (ref 0.44–1.00)
GFR, Estimated: >60 mL/min (ref >60)
Glucose, Bld: 92 mg/dL (ref 70–99)
Potassium: 2.9 mmol/L — ABNORMAL LOW (ref 3.5–5.1)
Sodium: 138 mmol/L (ref 135–145)
Total Bilirubin: 0.6 mg/dL (ref 0.3–1.2)
Total Protein: 6.6 g/dL (ref 6.5–8.1)

## 2020-12-15 MED ORDER — HEPARIN SOD (PORK) LOCK FLUSH 100 UNIT/ML IV SOLN
500.0000 [IU] | Freq: Once | INTRAVENOUS | Status: AC
  Administered 2020-12-15: 500 [IU] via INTRAVENOUS
  Filled 2020-12-15: qty 5

## 2020-12-15 MED ORDER — SODIUM CHLORIDE 0.9 % IV SOLN
Freq: Once | INTRAVENOUS | Status: AC
  Filled 2020-12-15: qty 250

## 2020-12-15 MED ORDER — POTASSIUM CHLORIDE 20 MEQ/100ML IV SOLN: 20.0000 meq | Freq: Once | INTRAVENOUS | Status: DC

## 2020-12-15 MED ORDER — POTASSIUM CHLORIDE 20 MEQ/100ML IV SOLN
20.0000 meq | Freq: Once | INTRAVENOUS | Status: DC
  Administered 2020-12-15: 20 meq via INTRAVENOUS

## 2020-12-15 MED ORDER — SODIUM CHLORIDE 0.9% FLUSH
10.0000 mL | INTRAVENOUS | Status: AC | PRN
  Administered 2020-12-15: 10 mL via INTRAVENOUS
  Filled 2020-12-15: qty 10

## 2020-12-15 MED ORDER — POTASSIUM CHLORIDE CRYS ER 20 MEQ PO TBCR
40.0000 meq | EXTENDED_RELEASE_TABLET | Freq: Every day | ORAL | 0 refills | Status: DC
Start: 2020-12-15 — End: 2021-10-19

## 2020-12-15 NOTE — Progress Notes (Signed)
kdur  

## 2020-12-15 NOTE — Progress Notes (Signed)
Pt has constipation prior to chemo and it is not getting better-she does not take stoll softner every day but encouraged that she should do it. Asked about miralax and she wants a pill instead of powder. Offered her to get senna kot s that has laxative in it. , she has low appetite, wt is down

## 2020-12-15 NOTE — Progress Notes (Signed)
Pt received IV hydration with addition of Potassium. No complaints at time of discharge. VSS. Ambulatory.

## 2020-12-15 NOTE — Progress Notes (Signed)
Hematology/Oncology Consult note [facility]  [phone] [phone]  Patient Care Team: Leonel Ramsay, MD as PCP - General (Infectious Diseases) Telford Nab, RN as Oncology Nurse Navigator   Name of the patient: Doris Johnson  [id number]  February 15, 1951   Date of visit: 12/15/20  Diagnosis- extensive stage small cell lung cancer with liver and brain metastases   Chief complaint/ Reason for visit-toxicity check after cycle 1 of carbo etoposide Tecentriq chemotherapy  Heme/Onc history: Patient is a 70 year old female with a remote history of smoking in her teenage years and exposure to passive smoking. She has been having ongoing midsternal pain which has been growing since the last 6 to 7 months. She had been to urgent care as well in the past. She finally had a CT chest with contrast done in October 2021 which showed a large mass in the left side of the mediastinum measuring 6.7 x 6.2 x 6.1 cm causing severe compression of the left main pulmonary artery and around the aortic arch in the left subclavian artery. Enlarged thyroid gland. Left upper lobe nodule 1 x 0.7 cm. She then underwent bronchoscopy with Dr.Aleskerov left upper lobe biopsy was nondiagnostic. However station 4, 7 and 10 L lymph nodes were consistent with metastatic small cell carcinoma.  PET CT scan showed enlarged level 3 cervical lymph node 2.2 cm that was hypermetabolic with an SUV of 9.7.  Large central 7.3 cm AP window mass.  5.7 cm left liver mass.  MRI brain with and without contrast showed 2 small foci of enhancement in the right cerebellar hemisphere and right temporal lobe without mass-effect or edema as well concerning for brain metastases  Interval history-reports feeling fatigued but overall tolerated chemotherapy well without any significant nausea and vomiting. She uses oxycodone 2-3 times a day for her chest pain. She reports having constipation. She  does not like the taste of MiraLAX. She has also met with nutrition and will be seeing palliative care today.  ECOG PS- 1 Pain scale- 3 Opioid associated constipation- yes  Review of systems- Review of Systems  Constitutional: Positive for malaise/fatigue. Negative for chills, fever and weight loss.  HENT: Negative for congestion, ear discharge and nosebleeds.   Eyes: Negative for blurred vision.  Respiratory: Negative for cough, hemoptysis, sputum production, shortness of breath and wheezing.   Cardiovascular: Positive for chest pain. Negative for palpitations, orthopnea and claudication.  Gastrointestinal: Positive for constipation. Negative for abdominal pain, blood in stool, diarrhea, heartburn, melena, nausea and vomiting.  Genitourinary: Negative for dysuria, flank pain, frequency, hematuria and urgency.  Musculoskeletal: Negative for back pain, joint pain and myalgias.  Skin: Negative for rash.  Neurological: Negative for dizziness, tingling, focal weakness, seizures, weakness and headaches.  Endo/Heme/Allergies: Does not bruise/bleed easily.  Psychiatric/Behavioral: Negative for depression and suicidal ideas. The patient does not have insomnia.       No Known Allergies   Past Medical History:  Diagnosis Date  . Cataract   . Diabetes mellitus without complication ([place])   . Hyperlipidemia   . Hypertension   . Hypothyroidism    doctor's keeping an eye on thyroid      Past Surgical History:  Procedure Laterality Date  . ABDOMINAL HYSTERECTOMY    . PORTA CATH INSERTION N/A 11/30/2020   Procedure: PORTA CATH INSERTION;  Surgeon: Algernon Huxley, MD;  Location: [place] [facility];  Service: Cardiovascular;  Laterality: N/A;  . VIDEO BRONCHOSCOPY WITH ENDOBRONCHIAL NAVIGATION N/A 10/21/2020  Procedure: VIDEO BRONCHOSCOPY WITH ENDOBRONCHIAL NAVIGATION;  Surgeon: Ottie Glazier, MD;  Location: [facility];  Service: Thoracic;  Laterality: N/A;  . VIDEO BRONCHOSCOPY WITH  ENDOBRONCHIAL ULTRASOUND N/A 10/21/2020   Procedure: VIDEO BRONCHOSCOPY WITH ENDOBRONCHIAL ULTRASOUND;  Surgeon: Ottie Glazier, MD;  Location: [facility];  Service: Thoracic;  Laterality: N/A;    Social History   Socioeconomic History  . Marital status: Single    Spouse name: Not on file  . Number of children: Not on file  . Years of education: Not on file  . Highest education level: Not on file  Occupational History  . Not on file  Tobacco Use  . Smoking status: Never Smoker  . Smokeless tobacco: Never Used  Substance and Sexual Activity  . Alcohol use: No    Comment: occasional  . Drug use: No  . Sexual activity: Not on file  Other Topics Concern  . Not on file  Social History Narrative  . Not on file   Social Determinants of Health   Financial Resource Strain: Not on file  Food Insecurity: Not on file  Transportation Needs: Not on file  Physical Activity: Not on file  Stress: Not on file  Social Connections: Not on file  Intimate Partner Violence: Not on file    Family History  Problem Relation Age of Onset  . Cancer Mother        breast  . Hypertension Mother   . Breast cancer Mother 14  . Heart disease Father   . Hyperlipidemia Brother   . Heart disease Brother      Current Outpatient Medications:  .  aspirin 325 MG tablet, Take 325 mg by mouth daily. , Disp: , Rfl:  .  docusate sodium (COLACE) 100 MG capsule, Take 200 mg by mouth at bedtime., Disp: , Rfl:  .  fluticasone (FLONASE) 50 MCG/ACT nasal spray, Place 2 sprays into both nostrils daily. (Patient taking differently: Place 2 sprays into both nostrils daily as needed for allergies.), Disp: 16 g, Rfl: 5 .  lidocaine-prilocaine (EMLA) cream, Apply 1 application topically as needed. Apply small amount to port site at least 1 hour prior to it being accessed, cover with plastic wrap, Disp: 30 g, Rfl: 1 .  lisinopril (PRINIVIL,ZESTRIL) 20 MG tablet, TAKE 1 TABLET BY MOUTH EVERY DAY (Patient taking  differently: Take 20 mg by mouth every evening.), Disp: 30 tablet, Rfl: 0 .  LORazepam (ATIVAN) 0.5 MG tablet, Take 1 tablet (0.5 mg total) by mouth every 6 (six) hours as needed (Nausea or vomiting)., Disp: 30 tablet, Rfl: 0 .  metFORMIN (GLUCOPHAGE) 500 MG tablet, Take 1,000 mg by mouth 2 (two) times daily with a meal. , Disp: , Rfl:  .  ondansetron (ZOFRAN) 8 MG tablet, Take 1 tablet (8 mg total) by mouth 2 (two) times daily as needed for refractory nausea / vomiting. Start on day 3 after carboplatin chemo., Disp: 30 tablet, Rfl: 1 .  oxyCODONE (OXY IR/ROXICODONE) 5 MG immediate release tablet, Take 1 tablet (5 mg total) by mouth 3 (three) times daily as needed for severe pain., Disp: 45 tablet, Rfl: 0 .  potassium chloride (KLOR-CON) 10 MEQ tablet, Take 10 mEq by mouth daily., Disp: , Rfl:  .  prochlorperazine (COMPAZINE) 10 MG tablet, Take 1 tablet (10 mg total) by mouth every 6 (six) hours as needed (Nausea or vomiting)., Disp: 30 tablet, Rfl: 1 .  rosuvastatin (CRESTOR) 10 MG tablet, Take 10 mg by mouth every evening. , Disp: , Rfl:  .  traMADol (ULTRAM) 50 MG tablet, Take 50 mg by mouth 3 (three) times daily as needed., Disp: , Rfl:  .  triamterene-hydrochlorothiazide (DYAZIDE) 37.5-25 MG capsule, TAKE ONE CAPSULE BY MOUTH DAILY (Patient taking differently: Take 1 capsule by mouth daily.), Disp: 30 capsule, Rfl: 0 No current facility-administered medications for this visit.  Facility-Administered Medications Ordered in Other Visits:  .  heparin lock flush 100 unit/mL, 500 Units, Intravenous, Once, Sindy Guadeloupe, MD .  sodium chloride flush (NS) 0.9 % injection 10 mL, 10 mL, Intravenous, PRN, Sindy Guadeloupe, MD  Physical exam:  Vitals:   12/15/20 0910  BP: 137/70  Pulse: 64  Resp: 16  Temp: (!) 97.1 F (36.2 C)  TempSrc: Oral  Weight: 139 lb (63 kg)   Physical Exam Eyes:     Extraocular Movements: EOM normal.  Cardiovascular:     Rate and Rhythm: Normal rate and regular rhythm.      Heart sounds: Normal heart sounds.  Pulmonary:     Effort: Pulmonary effort is normal.     Breath sounds: Normal breath sounds.  Abdominal:     General: Bowel sounds are normal.     Palpations: Abdomen is soft.  Skin:    General: Skin is warm and dry.  Neurological:     Mental Status: She is alert and oriented to person, place, and time.      CMP Latest Ref Rng & Units 12/01/2020  Glucose 70 - 99 mg/dL 113(H)  BUN 8 - 23 mg/dL 9  Creatinine 0.44 - 1.00 mg/dL 0.53  Sodium 135 - 145 mmol/L 138  Potassium 3.5 - 5.1 mmol/L 3.1(L)  Chloride 98 - 111 mmol/L 103  CO2 22 - 32 mmol/L 27  Calcium 8.9 - 10.3 mg/dL 9.7  Total Protein 6.5 - 8.1 g/dL 7.1  Total Bilirubin 0.3 - 1.2 mg/dL 0.8  Alkaline Phos 38 - 126 U/L 34(L)  AST 15 - 41 U/L 16  ALT 0 - 44 U/L 11   CBC Latest Ref Rng & Units 12/01/2020  WBC 4.0 - 10.5 K/uL 4.1  Hemoglobin 12.0 - 15.0 g/dL 11.8(L)  Hematocrit 36.0 - 46.0 % 34.6(L)  Platelets 150 - 400 K/uL 336    No images are attached to the encounter.  PERIPHERAL VASCULAR CATHETERIZATION  Result Date: 11/30/2020 See op note    Assessment and plan- Patient is a 70 y.o. female with extensive stage small cell lung cancer stage IV T1 N2 M1 with liver and brain metastases.  She is here for toxicity check after cycle 1 of carbo etoposide Tecentriq chemotherapy  Hypokalemia: Patient will receive 20 mEq of IV potassium today along with 1 L of IV fluids. We will also send her a prescription for oral potassium 40 mEq daily for 1 week.  Neoplasm related pain: Continue as needed oxycodone  Opioid-induced constipation: We have asked her to take senna along with stool softeners if she does not want to take MiraLAX.  I will see her back in 1 week for cycle two of carbo etoposide Tecentriq chemotherapy    Visit Diagnosis 1. Small cell lung cancer ([place])   2. Hypokalemia      Dr. Randa Evens, MD, MPH [facility] at [facility] [phone] 12/15/2020 8:45 AM

## 2020-12-15 NOTE — Progress Notes (Signed)
[place]  [phone] [phone]   Name: Doris Johnson Date: 12/15/2020 MRN: [id number]  DOB: 09-28-1951  Patient Care Team: Leonel Ramsay, MD as PCP - General (Infectious Diseases) Telford Nab, RN as Oncology Nurse Navigator    REASON FOR CONSULTATION: Doris Johnson is a 70 y.o. female with multiple medical problems including extensive stage small cell lung cancer with liver and brain metastases.  Patient had multiple months of worsening midsternal pain and eventually had CT of the chest with contrast in October 2021 revealing a large mediastinal mass with severe compression of the left main pulmonary artery.  PET/CT revealed extensive disease involving the chest, neck, and liver.  Additionally, patient was found to have small brain mets on MRI.  She has been started on systemic chemo/immunotherapy with plan for future whole brain radiation.  She is referred to palliative care to help address goals and manage ongoing symptoms.  SOCIAL HISTORY:     reports that she has never smoked. She has never used smokeless tobacco. She reports that she does not drink alcohol and does not use drugs.  Patient is divorced and lives at home with her daughter and 2 granddaughters.  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  ADVANCE DIRECTIVES:  Does not have  CODE STATUS:   PAST MEDICAL HISTORY: Past Medical History:  Diagnosis Date  . Cataract   . Diabetes mellitus without complication ([place])   . Hyperlipidemia   . Hypertension   . Hypothyroidism    doctor's keeping an eye on thyroid     PAST SURGICAL HISTORY:  Past Surgical History:  Procedure Laterality Date  . ABDOMINAL HYSTERECTOMY    . PORTA CATH INSERTION N/A 11/30/2020   Procedure: PORTA CATH INSERTION;  Surgeon: Algernon Huxley, MD;  Location:  [place] [facility];  Service: Cardiovascular;  Laterality: N/A;  . VIDEO BRONCHOSCOPY WITH ENDOBRONCHIAL NAVIGATION N/A 10/21/2020   Procedure: VIDEO BRONCHOSCOPY WITH ENDOBRONCHIAL NAVIGATION;  Surgeon: Ottie Glazier, MD;  Location: [facility];  Service: Thoracic;  Laterality: N/A;  . VIDEO BRONCHOSCOPY WITH ENDOBRONCHIAL ULTRASOUND N/A 10/21/2020   Procedure: VIDEO BRONCHOSCOPY WITH ENDOBRONCHIAL ULTRASOUND;  Surgeon: Ottie Glazier, MD;  Location: [facility];  Service: Thoracic;  Laterality: N/A;    HEMATOLOGY/ONCOLOGY HISTORY:  Oncology History  Small cell lung cancer ([place])  10/30/2020 Initial Diagnosis   Small cell lung cancer ([place])   11/12/2020 Cancer Staging   Staging form: Lung, AJCC 8th Edition - Clinical: Stage IVB (cT4, cNX, pM1c) - Signed by Sindy Guadeloupe, MD on 11/12/2020   12/01/2020 -  Chemotherapy    Patient is on Treatment Plan: LUNG SCLC CARBOPLATIN + ETOPOSIDE + ATEZOLIZUMAB INDUCTION Q21D / ATEZOLIZUMAB MAINTENANCE Q21D        ALLERGIES:  has No Known Allergies.  MEDICATIONS:  Current Outpatient Medications  Medication Sig Dispense Refill  . aspirin 325 MG tablet Take 325 mg by mouth daily.  (Patient not taking: Reported on 12/15/2020)    . docusate sodium (COLACE) 100 MG capsule Take 200 mg by mouth at bedtime.    . fluticasone (FLONASE) 50 MCG/ACT nasal spray Place 2 sprays into both nostrils daily. (Patient taking differently: Place 2 sprays into both nostrils daily as needed for allergies.) 16 g 5  . lidocaine-prilocaine (EMLA) cream Apply 1 application topically as needed. Apply small amount to port  site at least 1 hour prior to it being accessed, cover with plastic wrap 30 g 1  . lisinopril (PRINIVIL,ZESTRIL) 20 MG tablet TAKE 1 TABLET BY MOUTH EVERY DAY (Patient taking differently: Take 20 mg by mouth every evening.) 30 tablet 0  . LORazepam (ATIVAN) 0.5 MG tablet Take 1 tablet (0.5 mg total) by mouth every 6 (six) hours as needed (Nausea or vomiting).  (Patient not taking: Reported on 12/15/2020) 30 tablet 0  . metFORMIN (GLUCOPHAGE) 500 MG tablet Take 1,000 mg by mouth 2 (two) times daily with a meal.     . ondansetron (ZOFRAN) 8 MG tablet Take 1 tablet (8 mg total) by mouth 2 (two) times daily as needed for refractory nausea / vomiting. Start on day 3 after carboplatin chemo. (Patient not taking: Reported on 12/15/2020) 30 tablet 1  . oxyCODONE (OXY IR/ROXICODONE) 5 MG immediate release tablet Take 1 tablet (5 mg total) by mouth 3 (three) times daily as needed for severe pain. 45 tablet 0  . potassium chloride (KLOR-CON) 10 MEQ tablet Take 10 mEq by mouth daily.    . potassium chloride SA (KLOR-CON) 20 MEQ tablet Take 2 tablets (40 mEq total) by mouth daily. 7 tablet 0  . prochlorperazine (COMPAZINE) 10 MG tablet Take 1 tablet (10 mg total) by mouth every 6 (six) hours as needed (Nausea or vomiting). (Patient not taking: Reported on 12/15/2020) 30 tablet 1  . rosuvastatin (CRESTOR) 10 MG tablet Take 10 mg by mouth every evening.     . traMADol (ULTRAM) 50 MG tablet Take 50 mg by mouth 3 (three) times daily as needed. (Patient not taking: Reported on 12/15/2020)    . triamterene-hydrochlorothiazide (DYAZIDE) 37.5-25 MG capsule TAKE ONE CAPSULE BY MOUTH DAILY (Patient taking differently: Take 1 capsule by mouth daily.) 30 capsule 0   No current facility-administered medications for this visit.   Facility-Administered Medications Ordered in Other Visits  Medication Dose Route Frequency Provider Last Rate Last Admin  . heparin lock flush 100 unit/mL  500 Units Intravenous Once Sindy Guadeloupe, MD      . potassium chloride 20 mEq in 100 mL IVPB  20 mEq Intravenous Once Sindy Guadeloupe, MD 100 mL/hr at 12/15/20 1049 20 mEq at 12/15/20 1049  . sodium chloride flush (NS) 0.9 % injection 10 mL  10 mL Intravenous PRN Sindy Guadeloupe, MD   10 mL at 12/15/20 0850    VITAL SIGNS: There were no vitals taken for this visit. There were no vitals filed for this visit.   Estimated body mass index is 22.44 kg/m as calculated from the following:   Height as of 11/30/20: _0  (1.676 m).   Weight as of an earlier encounter on 12/15/20: 139 lb (63 kg).  LABS: CBC:    Component Value Date/Time   WBC 12.5 (H) 12/15/2020 0839   HGB 10.9 (L) 12/15/2020 0839   HCT 31.9 (L) 12/15/2020 0839   PLT 127 (L) 12/15/2020 0839   MCV 85.3 12/15/2020 0839   NEUTROABS 9.1 (H) 12/15/2020 0839   LYMPHSABS 1.7 12/15/2020 0839   MONOABS 0.8 12/15/2020 0839   EOSABS 0.0 12/15/2020 0839   BASOSABS 0.1 12/15/2020 0839   Comprehensive Metabolic Panel:    Component Value Date/Time   NA 138 12/15/2020 0839   NA 141 03/09/2015 0000   K 2.9 (L) 12/15/2020 0839   CL 100 12/15/2020 0839   CO2 28 12/15/2020 0839   BUN 12 12/15/2020 0839   BUN 11 03/09/2015 0000  CREATININE 0.52 12/15/2020 0839   GLUCOSE 92 12/15/2020 0839   CALCIUM 9.5 12/15/2020 0839   AST 15 12/15/2020 0839   ALT 10 12/15/2020 0839   ALKPHOS 76 12/15/2020 0839   BILITOT 0.6 12/15/2020 0839   PROT 6.6 12/15/2020 0839   ALBUMIN 4.1 12/15/2020 0839    RADIOGRAPHIC STUDIES: PERIPHERAL VASCULAR CATHETERIZATION  Result Date: 11/30/2020 See op note   PERFORMANCE STATUS (ECOG) : 1 - Symptomatic but completely ambulatory  Review of Systems Unless otherwise noted, a complete review of systems is negative.  Physical Exam General: NAD Pulmonary: Unlabored Extremities: no edema, no joint deformities Skin: no rashes Neurological: Weakness but otherwise nonfocal  IMPRESSION: I met with patient today while she was receiving IV fluids.  I introduced palliative care services and attempted to establish therapeutic rapport.  Patient says that she has received a lot of information regarding her cancer diagnosis but was not completely understanding of the stage or prognosis.  I gently discussed those issues with her.  She understands that treatment is not with curative intent and that her cancer will  ultimately prove terminal.  She is in agreement with current scope of treatment.  She says that she places her face in the [place] no matter what happens.  At baseline, patient lives at home with her daughter and granddaughters.  She is functionally independent with her own care.  Symptomatically, she has had persistent chest pain.  She takes oxycodone 3 times daily and finds that that has helped making the pain tolerable.  However, she does find that it is short-lived.  We discussed the nature of short acting versus long-acting pain medications.  Would maximize use of oxycodone and could consider starting her on a long-acting pain medication if needed.  Patient does endorse constipation, likely from oxycodone.  She was not quite clear on what she is taking for her current bowel regimen.  I recommended daily Senokot.  Her appetite has been poor.  We discussed maximizing high-calorie/high-protein foods.  She is trying to eat 3 meals a day but is only tolerating very small portions.  I suggested that she start daily oral nutritional supplements.  Patient is being followed by dietitian.  We discussed ACP documents.  Patient would want her daughter to be her decision-maker if needed.  She does not have healthcare prep turning or living will but was sent home with both of those documents.  She says that she has worked on completing his documents in the past but never had them notarized.  Also reviewed with her a MOST form, which she took home to discuss with family.  Patient does not think that she would want futile care at end-of-life.  PLAN: -Continue current scope of treatment -Continue oxycodone as needed for breakthrough pain -Consider starting a long-acting opioid if needed -Daily bowel regimen with Senokot -Start oral nutritional supplements once to twice daily -ACP/MOST form reviewed -MyChart visit with me in 3 to 4 weeks  Case discussed with Dr. Janese Banks   Patient expressed understanding  and was in agreement with this plan. She also understands that She can call the clinic at any time with any questions, concerns, or complaints.     Time Total: 30 minutes  Visit consisted of counseling and education dealing with the complex and emotionally intense issues of symptom management and palliative care in the setting of serious and potentially life-threatening illness.Greater than 50%  of this time was spent counseling and coordinating care related to the above assessment  and plan.  Signed by: Altha Harm, PhD, NP-C

## 2020-12-22 ENCOUNTER — Inpatient Hospital Stay: Payer: Medicare HMO

## 2020-12-22 ENCOUNTER — Inpatient Hospital Stay (HOSPITAL_BASED_OUTPATIENT_CLINIC_OR_DEPARTMENT_OTHER): Payer: Medicare HMO | Admitting: Oncology

## 2020-12-22 ENCOUNTER — Other Ambulatory Visit: Payer: Self-pay

## 2020-12-22 ENCOUNTER — Encounter: Payer: Self-pay | Admitting: Oncology

## 2020-12-22 ENCOUNTER — Encounter: Payer: Self-pay | Admitting: *Deleted

## 2020-12-22 ENCOUNTER — Telehealth: Payer: Self-pay | Admitting: Oncology

## 2020-12-22 VITALS — BP 131/87 | HR 66 | Temp 97.5°F | Resp 18 | Wt 138.2 lb

## 2020-12-22 DIAGNOSIS — Z5112 Encounter for antineoplastic immunotherapy: Secondary | ICD-10-CM | POA: Diagnosis not present

## 2020-12-22 DIAGNOSIS — C3490 Malignant neoplasm of unspecified part of unspecified bronchus or lung: Secondary | ICD-10-CM

## 2020-12-22 DIAGNOSIS — C3412 Malignant neoplasm of upper lobe, left bronchus or lung: Secondary | ICD-10-CM | POA: Diagnosis not present

## 2020-12-22 DIAGNOSIS — Z5111 Encounter for antineoplastic chemotherapy: Secondary | ICD-10-CM

## 2020-12-22 LAB — CBC WITH DIFFERENTIAL/PLATELET
Abs Immature Granulocytes: 0.01 10*3/uL (ref 0.00–0.07)
Basophils Absolute: 0 10*3/uL (ref 0.0–0.1)
Basophils Relative: 1 %
Eosinophils Absolute: 0 10*3/uL (ref 0.0–0.5)
Eosinophils Relative: 0 %
HCT: 31.8 % — ABNORMAL LOW (ref 36.0–46.0)
Hemoglobin: 11 g/dL — ABNORMAL LOW (ref 12.0–15.0)
Immature Granulocytes: 0 %
Lymphocytes Relative: 27 %
Lymphs Abs: 1.1 10*3/uL (ref 0.7–4.0)
MCH: 29.6 pg (ref 26.0–34.0)
MCHC: 34.6 g/dL (ref 30.0–36.0)
MCV: 85.7 fL (ref 80.0–100.0)
Monocytes Absolute: 0.5 10*3/uL (ref 0.1–1.0)
Monocytes Relative: 11 %
Neutro Abs: 2.5 10*3/uL (ref 1.7–7.7)
Neutrophils Relative %: 61 %
Platelets: 297 10*3/uL (ref 150–400)
RBC: 3.71 MIL/uL — ABNORMAL LOW (ref 3.87–5.11)
RDW: 15.7 % — ABNORMAL HIGH (ref 11.5–15.5)
WBC: 4.1 10*3/uL (ref 4.0–10.5)
nRBC: 0 % (ref 0.0–0.2)

## 2020-12-22 LAB — COMPREHENSIVE METABOLIC PANEL
ALT: 11 U/L (ref 0–44)
AST: 15 U/L (ref 15–41)
Albumin: 4.2 g/dL (ref 3.5–5.0)
Alkaline Phosphatase: 50 U/L (ref 38–126)
Anion gap: 10 (ref 5–15)
BUN: 8 mg/dL (ref 8–23)
CO2: 23 mmol/L (ref 22–32)
Calcium: 9.7 mg/dL (ref 8.9–10.3)
Chloride: 103 mmol/L (ref 98–111)
Creatinine, Ser: 0.7 mg/dL (ref 0.44–1.00)
GFR, Estimated: >60 mL/min (ref >60)
Glucose, Bld: 123 mg/dL — ABNORMAL HIGH (ref 70–99)
Potassium: 3.7 mmol/L (ref 3.5–5.1)
Sodium: 136 mmol/L (ref 135–145)
Total Bilirubin: 0.6 mg/dL (ref 0.3–1.2)
Total Protein: 6.9 g/dL (ref 6.5–8.1)

## 2020-12-22 MED ORDER — HEPARIN SOD (PORK) LOCK FLUSH 100 UNIT/ML IV SOLN
500.0000 [IU] | Freq: Once | INTRAVENOUS | Status: DC | PRN
  Filled 2020-12-22: qty 5

## 2020-12-22 MED ORDER — SODIUM CHLORIDE 0.9 % IV SOLN
150.0000 mg | Freq: Once | INTRAVENOUS | Status: AC
  Administered 2020-12-22: 150 mg via INTRAVENOUS
  Filled 2020-12-22: qty 150

## 2020-12-22 MED ORDER — SODIUM CHLORIDE 0.9 % IV SOLN
1200.0000 mg | Freq: Once | INTRAVENOUS | Status: AC
  Administered 2020-12-22: 1200 mg via INTRAVENOUS
  Filled 2020-12-22: qty 20

## 2020-12-22 MED ORDER — SODIUM CHLORIDE 0.9 % IV SOLN
100.0000 mg/m2 | Freq: Once | INTRAVENOUS | Status: AC
  Administered 2020-12-22: 180 mg via INTRAVENOUS
  Filled 2020-12-22: qty 9

## 2020-12-22 MED ORDER — HEPARIN SOD (PORK) LOCK FLUSH 100 UNIT/ML IV SOLN
INTRAVENOUS | Status: AC
  Filled 2020-12-22: qty 5

## 2020-12-22 MED ORDER — OXYCODONE HCL 5 MG PO TABS: 5.0000 mg | ORAL_TABLET | Freq: Three times a day (TID) | ORAL | 0 refills | Status: DC | PRN

## 2020-12-22 MED ORDER — SODIUM CHLORIDE 0.9 % IV SOLN
Freq: Once | INTRAVENOUS | Status: AC
  Filled 2020-12-22: qty 250

## 2020-12-22 MED ORDER — SODIUM CHLORIDE 0.9 % IV SOLN
10.0000 mg | Freq: Once | INTRAVENOUS | Status: AC
  Administered 2020-12-22: 10 mg via INTRAVENOUS
  Filled 2020-12-22: qty 10

## 2020-12-22 MED ORDER — PALONOSETRON HCL INJECTION 0.25 MG/5ML
0.2500 mg | Freq: Once | INTRAVENOUS | Status: AC
  Administered 2020-12-22: 0.25 mg via INTRAVENOUS
  Filled 2020-12-22: qty 5

## 2020-12-22 MED ORDER — SODIUM CHLORIDE 0.9 % IV SOLN
400.0000 mg | Freq: Once | INTRAVENOUS | Status: AC
  Administered 2020-12-22: 400 mg via INTRAVENOUS
  Filled 2020-12-22: qty 40

## 2020-12-22 MED ORDER — SODIUM CHLORIDE 0.9% FLUSH
10.0000 mL | Freq: Once | INTRAVENOUS | Status: AC
  Administered 2020-12-22: 10 mL via INTRAVENOUS
  Filled 2020-12-22: qty 10

## 2020-12-22 MED ORDER — HEPARIN SOD (PORK) LOCK FLUSH 100 UNIT/ML IV SOLN
500.0000 [IU] | Freq: Once | INTRAVENOUS | Status: AC
  Administered 2020-12-22: 500 [IU] via INTRAVENOUS
  Filled 2020-12-22: qty 5

## 2020-12-22 MED ORDER — SODIUM CHLORIDE 0.9% FLUSH
10.0000 mL | INTRAVENOUS | Status: DC | PRN
  Administered 2020-12-22: 10 mL
  Filled 2020-12-22: qty 10

## 2020-12-22 NOTE — Progress Notes (Signed)
Tecentriq, etop, carbo well tolerated. Discharged home in stable condition.

## 2020-12-22 NOTE — Telephone Encounter (Signed)
Left VM with pt notifying her of CT scan scheduled for 2/22 at 9:30am with instructions to arrive by 9:15,test kit must be picked up prior to day of scan and pt must be NPO for 4 hrs before.

## 2020-12-23 ENCOUNTER — Inpatient Hospital Stay: Payer: Medicare HMO

## 2020-12-23 VITALS — BP 109/71 | HR 60 | Temp 97.3°F

## 2020-12-23 DIAGNOSIS — C3490 Malignant neoplasm of unspecified part of unspecified bronchus or lung: Secondary | ICD-10-CM

## 2020-12-23 DIAGNOSIS — Z5112 Encounter for antineoplastic immunotherapy: Secondary | ICD-10-CM | POA: Diagnosis not present

## 2020-12-23 MED ORDER — SODIUM CHLORIDE 0.9 % IV SOLN
Freq: Once | INTRAVENOUS | Status: AC
  Filled 2020-12-23: qty 250

## 2020-12-23 MED ORDER — SODIUM CHLORIDE 0.9 % IV SOLN
100.0000 mg/m2 | Freq: Once | INTRAVENOUS | Status: AC
  Administered 2020-12-23: 180 mg via INTRAVENOUS
  Filled 2020-12-23: qty 9

## 2020-12-23 MED ORDER — SODIUM CHLORIDE 0.9 % IV SOLN
10.0000 mg | Freq: Once | INTRAVENOUS | Status: AC
  Administered 2020-12-23: 10 mg via INTRAVENOUS
  Filled 2020-12-23: qty 10

## 2020-12-23 NOTE — Progress Notes (Signed)
Patient tolerated infusion well. Patient discharged stable, with assessed port. Patient understands to return tomorrow for treatment/ de-access. No concerns voiced.

## 2020-12-24 ENCOUNTER — Inpatient Hospital Stay: Payer: Medicare HMO

## 2020-12-24 VITALS — BP 116/69 | HR 56 | Temp 98.2°F | Resp 18

## 2020-12-24 DIAGNOSIS — C3490 Malignant neoplasm of unspecified part of unspecified bronchus or lung: Secondary | ICD-10-CM

## 2020-12-24 DIAGNOSIS — Z5112 Encounter for antineoplastic immunotherapy: Secondary | ICD-10-CM | POA: Diagnosis not present

## 2020-12-24 MED ORDER — PEGFILGRASTIM 6 MG/0.6ML ~~LOC~~ PSKT
6.0000 mg | PREFILLED_SYRINGE | Freq: Once | SUBCUTANEOUS | Status: AC
  Administered 2020-12-24: 6 mg via SUBCUTANEOUS
  Filled 2020-12-24: qty 0.6

## 2020-12-24 MED ORDER — HEPARIN SOD (PORK) LOCK FLUSH 100 UNIT/ML IV SOLN
500.0000 [IU] | Freq: Once | INTRAVENOUS | Status: AC | PRN
  Administered 2020-12-24: 500 [IU]
  Filled 2020-12-24: qty 5

## 2020-12-24 MED ORDER — SODIUM CHLORIDE 0.9 % IV SOLN
100.0000 mg/m2 | Freq: Once | INTRAVENOUS | Status: AC
  Administered 2020-12-24: 180 mg via INTRAVENOUS
  Filled 2020-12-24: qty 9

## 2020-12-24 MED ORDER — SODIUM CHLORIDE 0.9 % IV SOLN
Freq: Once | INTRAVENOUS | Status: AC
  Filled 2020-12-24: qty 250

## 2020-12-24 MED ORDER — SODIUM CHLORIDE 0.9 % IV SOLN
10.0000 mg | Freq: Once | INTRAVENOUS | Status: AC
  Administered 2020-12-24: 10 mg via INTRAVENOUS
  Filled 2020-12-24: qty 10

## 2020-12-24 MED ORDER — HEPARIN SOD (PORK) LOCK FLUSH 100 UNIT/ML IV SOLN
INTRAVENOUS | Status: AC
  Filled 2020-12-24: qty 5

## 2020-12-24 NOTE — Progress Notes (Signed)
Patient tolerated infusion well. Discharged home.  

## 2020-12-24 NOTE — Progress Notes (Signed)
Nutrition Follow-up:  Patient with small cell lung cancer with mets to liver and brain.  Patient receiving chemotherapy.   Met with patient during infusion. Patient reports no appetite and taste is off.  "I think of something to eat then when I get it I just can take a few bites."  Has only drank a ensure shake today (RD saw at 2:25pm).  Has a taste for foods like fruits, corn, greens.  Continues to take medication for constipation    Medications: reviewed  Labs: reviewed  Anthropometrics:   Weight 138 lb 3.2 oz on 2/8  147 lb on 1/8   NUTRITION DIAGNOSIS:  Inadequate oral intake continues   INTERVENTION:  Encouraged eating or drinking shake within 1-2 hours of waking then q 1-2 hours Continue bowel regimen May need to consider trial of appetite stimulant     MONITORING, EVALUATION, GOAL: weight trends, intake   NEXT VISIT: March 3 during infusion  Gerasimos Plotts B. Zenia Resides, [place], [place] Registered Dietitian [phone] (mobile)

## 2020-12-27 NOTE — Progress Notes (Signed)
Hematology/Oncology Consult note [facility]  [phone] [phone]  Patient Care Team: Leonel Ramsay, MD as PCP - General (Infectious Diseases) Telford Nab, RN as Oncology Nurse Navigator   Name of the patient: Doris Johnson  [id number]  Jul 13, 1951   Date of visit: 12/27/20  Diagnosis- extensive stage small cell lung cancer with liver and brain metastases  Chief complaint/ Reason for visit-on treatment assessment prior to cycle 2 of carbo etoposide Tecentriq chemotherapy  Heme/Onc history: Patient is a 70 year old female with a remote history of smoking in her teenage years and exposure to passive smoking. She has been having ongoing midsternal pain which has been growing since the last 6 to 7 months. She had been to urgent care as well in the past. She finally had a CT chest with contrast done in October 2021 which showed a large mass in the left side of the mediastinum measuring 6.7 x 6.2 x 6.1 cm causing severe compression of the left main pulmonary artery and around the aortic arch in the left subclavian artery. Enlarged thyroid gland. Left upper lobe nodule 1 x 0.7 cm. She then underwent bronchoscopy with Dr.Aleskerov left upper lobe biopsy was nondiagnostic. However station 4, 7 and 10 L lymph nodes were consistent with metastatic small cell carcinoma.  PET CT scan showed enlarged level 3 cervical lymph node 2.2 cm that was hypermetabolic with an SUV of 9.7. Large central 7.3 cm AP window mass. 5.7 cm left liver mass. MRI brain with and without contrast showed 2 small foci of enhancement in the right cerebellar hemisphere and right temporal lobe without mass-effect or edema as well concerning for brain metastases   Interval history-tolerating chemotherapy relatively well without any significant nausea or vomiting.  Profound status is at baseline.  She does have some fatigue which has not worsened since start of  chemotherapy.  She has intermittent chest pain which is overall improved since the start of treatment.  ECOG PS- 1 Pain scale- 0 Opioid associated constipation- no  Review of systems- Review of Systems  Constitutional: Positive for malaise/fatigue. Negative for chills, fever and weight loss.  HENT: Negative for congestion, ear discharge and nosebleeds.   Eyes: Negative for blurred vision.  Respiratory: Negative for cough, hemoptysis, sputum production, shortness of breath and wheezing.   Cardiovascular: Negative for chest pain, palpitations, orthopnea and claudication.  Gastrointestinal: Negative for abdominal pain, blood in stool, constipation, diarrhea, heartburn, melena, nausea and vomiting.  Genitourinary: Negative for dysuria, flank pain, frequency, hematuria and urgency.  Musculoskeletal: Negative for back pain, joint pain and myalgias.  Skin: Negative for rash.  Neurological: Negative for dizziness, tingling, focal weakness, seizures, weakness and headaches.  Endo/Heme/Allergies: Does not bruise/bleed easily.  Psychiatric/Behavioral: Negative for depression and suicidal ideas. The patient does not have insomnia.     No Known Allergies   Past Medical History:  Diagnosis Date  . Cataract   . Diabetes mellitus without complication ([place])   . Hyperlipidemia   . Hypertension   . Hypothyroidism    doctor's keeping an eye on thyroid      Past Surgical History:  Procedure Laterality Date  . ABDOMINAL HYSTERECTOMY    . PORTA CATH INSERTION N/A 11/30/2020   Procedure: PORTA CATH INSERTION;  Surgeon: Algernon Huxley, MD;  Location: [facility];  Service: Cardiovascular;  Laterality: N/A;  . VIDEO BRONCHOSCOPY WITH ENDOBRONCHIAL NAVIGATION N/A 10/21/2020   Procedure: VIDEO BRONCHOSCOPY WITH ENDOBRONCHIAL NAVIGATION;  Surgeon: Ottie Glazier, MD;  Location: [facility];  Service: Thoracic;  Laterality: N/A;  . VIDEO BRONCHOSCOPY WITH ENDOBRONCHIAL ULTRASOUND N/A 10/21/2020    Procedure: VIDEO BRONCHOSCOPY WITH ENDOBRONCHIAL ULTRASOUND;  Surgeon: Ottie Glazier, MD;  Location: [facility];  Service: Thoracic;  Laterality: N/A;    Social History   Socioeconomic History  . Marital status: Single    Spouse name: Not on file  . Number of children: Not on file  . Years of education: Not on file  . Highest education level: Not on file  Occupational History  . Not on file  Tobacco Use  . Smoking status: Never Smoker  . Smokeless tobacco: Never Used  Vaping Use  . Vaping Use: Never used  Substance and Sexual Activity  . Alcohol use: No  . Drug use: No  . Sexual activity: Not on file  Other Topics Concern  . Not on file  Social History Narrative  . Not on file   Social Determinants of Health   Financial Resource Strain: Not on file  Food Insecurity: Not on file  Transportation Needs: Not on file  Physical Activity: Not on file  Stress: Not on file  Social Connections: Not on file  Intimate Partner Violence: Not on file    Family History  Problem Relation Age of Onset  . Cancer Mother        breast  . Hypertension Mother   . Breast cancer Mother 5  . Heart disease Father   . Hyperlipidemia Brother   . Heart disease Brother      Current Outpatient Medications:  .  docusate sodium (COLACE) 100 MG capsule, Take 200 mg by mouth at bedtime., Disp: , Rfl:  .  fluticasone (FLONASE) 50 MCG/ACT nasal spray, Place 2 sprays into both nostrils daily. (Patient taking differently: Place 2 sprays into both nostrils daily as needed for allergies.), Disp: 16 g, Rfl: 5 .  lidocaine-prilocaine (EMLA) cream, Apply 1 application topically as needed. Apply small amount to port site at least 1 hour prior to it being accessed, cover with plastic wrap, Disp: 30 g, Rfl: 1 .  lisinopril (PRINIVIL,ZESTRIL) 20 MG tablet, TAKE 1 TABLET BY MOUTH EVERY DAY (Patient taking differently: Take 20 mg by mouth every evening.), Disp: 30 tablet, Rfl: 0 .  rosuvastatin (CRESTOR) 10 MG  tablet, Take 10 mg by mouth every evening. , Disp: , Rfl:  .  triamterene-hydrochlorothiazide (DYAZIDE) 37.5-25 MG capsule, TAKE ONE CAPSULE BY MOUTH DAILY (Patient taking differently: Take 1 capsule by mouth daily.), Disp: 30 capsule, Rfl: 0 .  LORazepam (ATIVAN) 0.5 MG tablet, Take 1 tablet (0.5 mg total) by mouth every 6 (six) hours as needed (Nausea or vomiting). (Patient not taking: No sig reported), Disp: 30 tablet, Rfl: 0 .  metFORMIN (GLUCOPHAGE) 500 MG tablet, Take 1,000 mg by mouth 2 (two) times daily with a meal. , Disp: , Rfl:  .  ondansetron (ZOFRAN) 8 MG tablet, Take 1 tablet (8 mg total) by mouth 2 (two) times daily as needed for refractory nausea / vomiting. Start on day 3 after carboplatin chemo. (Patient not taking: No sig reported), Disp: 30 tablet, Rfl: 1 .  oxyCODONE (OXY IR/ROXICODONE) 5 MG immediate release tablet, Take 1 tablet (5 mg total) by mouth 3 (three) times daily as needed for severe pain., Disp: 45 tablet, Rfl: 0 .  potassium chloride SA (KLOR-CON) 20 MEQ tablet, Take 2 tablets (40 mEq total) by mouth daily. (Patient not taking: Reported on 12/22/2020), Disp: 7 tablet, Rfl: 0 .  prochlorperazine (  COMPAZINE) 10 MG tablet, Take 1 tablet (10 mg total) by mouth every 6 (six) hours as needed (Nausea or vomiting). (Patient not taking: No sig reported), Disp: 30 tablet, Rfl: 1 .  sennosides-docusate sodium (SENOKOT-S) 8.6-50 MG tablet, Take 1 tablet by mouth daily., Disp: , Rfl:  No current facility-administered medications for this visit.  Facility-Administered Medications Ordered in Other Visits:  .  sodium chloride flush (NS) 0.9 % injection 10 mL, 10 mL, Intravenous, PRN, Sindy Guadeloupe, MD, 10 mL at 12/15/20 0850  Physical exam:  Vitals:   12/22/20 0907  BP: 131/87  Pulse: 66  Resp: 18  Temp: (!) 97.5 F (36.4 C)  TempSrc: Tympanic  SpO2: 100%  Weight: 138 lb 3.2 oz (62.7 kg)   Physical Exam Constitutional:      General: She is not in acute distress. Eyes:      Extraocular Movements: EOM normal.     Pupils: Pupils are equal, round, and reactive to light.  Cardiovascular:     Rate and Rhythm: Normal rate and regular rhythm.     Heart sounds: Normal heart sounds.  Pulmonary:     Effort: Pulmonary effort is normal.     Breath sounds: Normal breath sounds.  Abdominal:     General: Bowel sounds are normal.     Palpations: Abdomen is soft.  Skin:    General: Skin is warm and dry.  Neurological:     Mental Status: She is alert and oriented to person, place, and time.      CMP Latest Ref Rng & Units 12/22/2020  Glucose 70 - 99 mg/dL 123(H)  BUN 8 - 23 mg/dL 8  Creatinine 0.44 - 1.00 mg/dL 0.70  Sodium 135 - 145 mmol/L 136  Potassium 3.5 - 5.1 mmol/L 3.7  Chloride 98 - 111 mmol/L 103  CO2 22 - 32 mmol/L 23  Calcium 8.9 - 10.3 mg/dL 9.7  Total Protein 6.5 - 8.1 g/dL 6.9  Total Bilirubin 0.3 - 1.2 mg/dL 0.6  Alkaline Phos 38 - 126 U/L 50  AST 15 - 41 U/L 15  ALT 0 - 44 U/L 11   CBC Latest Ref Rng & Units 12/22/2020  WBC 4.0 - 10.5 K/uL 4.1  Hemoglobin 12.0 - 15.0 g/dL 11.0(L)  Hematocrit 36.0 - 46.0 % 31.8(L)  Platelets 150 - 400 K/uL 297    No images are attached to the encounter.  PERIPHERAL VASCULAR CATHETERIZATION  Result Date: 11/30/2020 See op note    Assessment and plan- Patient is a 70 y.o. female with extensive stage small cell lung cancer with liver and brain metastases here for on treatment assessment prior to cycle 2 of carbo etoposide Tecentriq chemotherapy  Counts okay to proceed with cycle 2 of carbo etoposide Tecentriq chemotherapy today.  I will obtain interim CT chest abdomen and pelvis with contrast to assess response to treatment after the cycle.  I will see her back in 3 weeks for cycle 3 of chemotherapy.  Plan is to do 4 cycles of chemotherapy followed by maintenance Tecentriq.  She did have 2 small foci of enhancement in the right cerebellar hemisphere and would also benefit from whole brain radiation after 4  cycles of chemotherapy.  Neoplasm related pain: Continue as needed oxycodone   Visit Diagnosis 1. Small cell lung cancer ([place])   2. Encounter for antineoplastic chemotherapy      Dr. Randa Evens, MD, MPH [facility] [phone] 12/27/2020 5:09 PM

## 2021-01-04 ENCOUNTER — Other Ambulatory Visit: Payer: Self-pay | Admitting: *Deleted

## 2021-01-04 DIAGNOSIS — C3490 Malignant neoplasm of unspecified part of unspecified bronchus or lung: Secondary | ICD-10-CM

## 2021-01-04 MED ORDER — OXYCODONE HCL 5 MG PO TABS: 5.0000 mg | ORAL_TABLET | Freq: Three times a day (TID) | ORAL | 0 refills | Status: DC | PRN

## 2021-01-05 ENCOUNTER — Ambulatory Visit
Admission: RE | Admit: 2021-01-05 | Discharge: 2021-01-05 | Disposition: A | Discharge status: Home / Self Care | Payer: Medicare HMO | Source: Ambulatory Visit | Attending: Oncology | Admitting: Oncology

## 2021-01-05 ENCOUNTER — Other Ambulatory Visit: Payer: Self-pay

## 2021-01-05 DIAGNOSIS — C3490 Malignant neoplasm of unspecified part of unspecified bronchus or lung: Secondary | ICD-10-CM | POA: Insufficient documentation

## 2021-01-05 HISTORY — DX: Malignant (primary) neoplasm, unspecified: C80.1

## 2021-01-05 MED ORDER — IOHEXOL 300 MG/ML  SOLN
100.0000 mL | Freq: Once | INTRAMUSCULAR | Status: AC | PRN
  Administered 2021-01-05: 100 mL via INTRAVENOUS

## 2021-01-12 ENCOUNTER — Inpatient Hospital Stay: Payer: Medicare HMO

## 2021-01-12 ENCOUNTER — Inpatient Hospital Stay (HOSPITAL_BASED_OUTPATIENT_CLINIC_OR_DEPARTMENT_OTHER): Payer: Medicare HMO | Admitting: Oncology

## 2021-01-12 ENCOUNTER — Encounter: Payer: Self-pay | Admitting: Oncology

## 2021-01-12 ENCOUNTER — Other Ambulatory Visit: Payer: Self-pay

## 2021-01-12 ENCOUNTER — Inpatient Hospital Stay: Payer: Medicare HMO | Attending: Oncology

## 2021-01-12 VITALS — BP 148/71 | HR 68 | Temp 96.3°F | Resp 16 | Wt 133.3 lb

## 2021-01-12 DIAGNOSIS — T402X5A Adverse effect of other opioids, initial encounter: Secondary | ICD-10-CM | POA: Diagnosis not present

## 2021-01-12 DIAGNOSIS — C7931 Secondary malignant neoplasm of brain: Secondary | ICD-10-CM | POA: Insufficient documentation

## 2021-01-12 DIAGNOSIS — Z5189 Encounter for other specified aftercare: Secondary | ICD-10-CM | POA: Insufficient documentation

## 2021-01-12 DIAGNOSIS — C778 Secondary and unspecified malignant neoplasm of lymph nodes of multiple regions: Secondary | ICD-10-CM | POA: Insufficient documentation

## 2021-01-12 DIAGNOSIS — C787 Secondary malignant neoplasm of liver and intrahepatic bile duct: Secondary | ICD-10-CM | POA: Diagnosis not present

## 2021-01-12 DIAGNOSIS — K5903 Drug induced constipation: Secondary | ICD-10-CM | POA: Insufficient documentation

## 2021-01-12 DIAGNOSIS — Z5111 Encounter for antineoplastic chemotherapy: Secondary | ICD-10-CM | POA: Diagnosis not present

## 2021-01-12 DIAGNOSIS — R072 Precordial pain: Secondary | ICD-10-CM | POA: Insufficient documentation

## 2021-01-12 DIAGNOSIS — R5383 Other fatigue: Secondary | ICD-10-CM | POA: Diagnosis not present

## 2021-01-12 DIAGNOSIS — Z5112 Encounter for antineoplastic immunotherapy: Secondary | ICD-10-CM

## 2021-01-12 DIAGNOSIS — G893 Neoplasm related pain (acute) (chronic): Secondary | ICD-10-CM

## 2021-01-12 DIAGNOSIS — Z803 Family history of malignant neoplasm of breast: Secondary | ICD-10-CM | POA: Insufficient documentation

## 2021-01-12 DIAGNOSIS — C3490 Malignant neoplasm of unspecified part of unspecified bronchus or lung: Secondary | ICD-10-CM

## 2021-01-12 DIAGNOSIS — Z79899 Other long term (current) drug therapy: Secondary | ICD-10-CM | POA: Diagnosis not present

## 2021-01-12 DIAGNOSIS — C3412 Malignant neoplasm of upper lobe, left bronchus or lung: Secondary | ICD-10-CM | POA: Diagnosis not present

## 2021-01-12 DIAGNOSIS — T451X5A Adverse effect of antineoplastic and immunosuppressive drugs, initial encounter: Secondary | ICD-10-CM | POA: Insufficient documentation

## 2021-01-12 DIAGNOSIS — Z8349 Family history of other endocrine, nutritional and metabolic diseases: Secondary | ICD-10-CM | POA: Insufficient documentation

## 2021-01-12 DIAGNOSIS — D6481 Anemia due to antineoplastic chemotherapy: Secondary | ICD-10-CM | POA: Diagnosis not present

## 2021-01-12 DIAGNOSIS — Z8249 Family history of ischemic heart disease and other diseases of the circulatory system: Secondary | ICD-10-CM | POA: Diagnosis not present

## 2021-01-12 LAB — CBC WITH DIFFERENTIAL/PLATELET
Abs Immature Granulocytes: 0.02 10*3/uL (ref 0.00–0.07)
Basophils Absolute: 0 10*3/uL (ref 0.0–0.1)
Basophils Relative: 0 %
Eosinophils Absolute: 0 10*3/uL (ref 0.0–0.5)
Eosinophils Relative: 0 %
HCT: 32 % — ABNORMAL LOW (ref 36.0–46.0)
Hemoglobin: 10.6 g/dL — ABNORMAL LOW (ref 12.0–15.0)
Immature Granulocytes: 0 %
Lymphocytes Relative: 17 %
Lymphs Abs: 0.8 10*3/uL (ref 0.7–4.0)
MCH: 29.8 pg (ref 26.0–34.0)
MCHC: 33.1 g/dL (ref 30.0–36.0)
MCV: 89.9 fL (ref 80.0–100.0)
Monocytes Absolute: 0.6 10*3/uL (ref 0.1–1.0)
Monocytes Relative: 12 %
Neutro Abs: 3.4 10*3/uL (ref 1.7–7.7)
Neutrophils Relative %: 71 %
Platelets: 317 10*3/uL (ref 150–400)
RBC: 3.56 MIL/uL — ABNORMAL LOW (ref 3.87–5.11)
RDW: 19.1 % — ABNORMAL HIGH (ref 11.5–15.5)
WBC: 4.8 10*3/uL (ref 4.0–10.5)
nRBC: 0 % (ref 0.0–0.2)

## 2021-01-12 LAB — COMPREHENSIVE METABOLIC PANEL
ALT: 10 U/L (ref 0–44)
AST: 14 U/L — ABNORMAL LOW (ref 15–41)
Albumin: 4 g/dL (ref 3.5–5.0)
Alkaline Phosphatase: 53 U/L (ref 38–126)
Anion gap: 10 (ref 5–15)
BUN: 10 mg/dL (ref 8–23)
CO2: 23 mmol/L (ref 22–32)
Calcium: 9.5 mg/dL (ref 8.9–10.3)
Chloride: 105 mmol/L (ref 98–111)
Creatinine, Ser: 0.52 mg/dL (ref 0.44–1.00)
GFR, Estimated: >60 mL/min (ref >60)
Glucose, Bld: 100 mg/dL — ABNORMAL HIGH (ref 70–99)
Potassium: 3.9 mmol/L (ref 3.5–5.1)
Sodium: 138 mmol/L (ref 135–145)
Total Bilirubin: 0.5 mg/dL (ref 0.3–1.2)
Total Protein: 6.7 g/dL (ref 6.5–8.1)

## 2021-01-12 MED ORDER — SODIUM CHLORIDE 0.9 % IV SOLN: 584.5000 mg | Freq: Once | INTRAVENOUS | Status: DC

## 2021-01-12 MED ORDER — SODIUM CHLORIDE 0.9 % IV SOLN
Freq: Once | INTRAVENOUS | Status: AC
  Filled 2021-01-12: qty 250

## 2021-01-12 MED ORDER — HEPARIN SOD (PORK) LOCK FLUSH 100 UNIT/ML IV SOLN
INTRAVENOUS | Status: AC
  Filled 2021-01-12: qty 5

## 2021-01-12 MED ORDER — SODIUM CHLORIDE 0.9 % IV SOLN
10.0000 mg | Freq: Once | INTRAVENOUS | Status: AC
  Administered 2021-01-12: 10 mg via INTRAVENOUS
  Filled 2021-01-12: qty 10

## 2021-01-12 MED ORDER — SODIUM CHLORIDE 0.9 % IV SOLN
100.0000 mg/m2 | Freq: Once | INTRAVENOUS | Status: AC
  Administered 2021-01-12: 180 mg via INTRAVENOUS
  Filled 2021-01-12: qty 9

## 2021-01-12 MED ORDER — SODIUM CHLORIDE 0.9 % IV SOLN
1200.0000 mg | Freq: Once | INTRAVENOUS | Status: AC
  Administered 2021-01-12: 1200 mg via INTRAVENOUS
  Filled 2021-01-12: qty 20

## 2021-01-12 MED ORDER — OXYCODONE HCL 5 MG PO TABS: 5.0000 mg | ORAL_TABLET | Freq: Three times a day (TID) | ORAL | 0 refills | Status: DC | PRN

## 2021-01-12 MED ORDER — SODIUM CHLORIDE 0.9 % IV SOLN
150.0000 mg | Freq: Once | INTRAVENOUS | Status: AC
  Administered 2021-01-12: 150 mg via INTRAVENOUS
  Filled 2021-01-12: qty 150

## 2021-01-12 MED ORDER — HEPARIN SOD (PORK) LOCK FLUSH 100 UNIT/ML IV SOLN
500.0000 [IU] | Freq: Once | INTRAVENOUS | Status: AC | PRN
  Administered 2021-01-12: 500 [IU]
  Filled 2021-01-12: qty 5

## 2021-01-12 MED ORDER — SODIUM CHLORIDE 0.9 % IV SOLN
400.0000 mg | Freq: Once | INTRAVENOUS | Status: AC
  Administered 2021-01-12: 400 mg via INTRAVENOUS
  Filled 2021-01-12: qty 40

## 2021-01-12 MED ORDER — PALONOSETRON HCL INJECTION 0.25 MG/5ML
0.2500 mg | Freq: Once | INTRAVENOUS | Status: AC
  Administered 2021-01-12: 0.25 mg via INTRAVENOUS
  Filled 2021-01-12: qty 5

## 2021-01-12 NOTE — Progress Notes (Signed)
Hematology/Oncology Consult note [facility]  Telephone:([phone] [phone]  Patient Care Team: Leonel Ramsay, MD as PCP - General (Infectious Diseases) Telford Nab, RN as Oncology Nurse Navigator   Name of the patient: Doris Johnson  [id number]  05/19/1951   Date of visit: 01/12/21  Diagnosis-  extensive stage small cell lung cancer with liver and brain metastases  Chief complaint/ Reason for visit-on treatment assessment prior to cycle 3 of carbo etoposide Tecentriq chemotherapy and discuss CT scan results  Heme/Onc history: Patient is a 70 year old female with a remote history of smoking in her teenage years and exposure to passive smoking. She has been having ongoing midsternal pain which has been growing since the last 6 to 7 months. She had been to urgent care as well in the past. She finally had a CT chest with contrast done in October 2021 which showed a large mass in the left side of the mediastinum measuring 6.7 x 6.2 x 6.1 cm causing severe compression of the left main pulmonary artery and around the aortic arch in the left subclavian artery. Enlarged thyroid gland. Left upper lobe nodule 1 x 0.7 cm. She then underwent bronchoscopy with Dr.Aleskerov left upper lobe biopsy was nondiagnostic. However station 4, 7 and 10 L lymph nodes were consistent with metastatic small cell carcinoma.  PET CT scan showed enlarged level 3 cervical lymph node 2.2 cm that was hypermetabolic with an SUV of 9.7. Large central 7.3 cm AP window mass. 5.7 cm left liver mass. MRI brain with and without contrast showed 2 small foci of enhancement in the right cerebellar hemisphere and right temporal lobe without mass-effect or edema as well concerning for brain metastases   Interval history-reports ongoing midsternal chest pain.  Has baseline fatigue.  Denies any significant nausea and vomiting.  Has been having problems with constipation for which  she uses MiraLAX and stool softener.  ECOG PS- 1 Pain scale- 3 Opioid associated constipation- no  Review of systems- Review of Systems  Constitutional: Negative for chills, fever, malaise/fatigue and weight loss.  HENT: Negative for congestion, ear discharge and nosebleeds.   Eyes: Negative for blurred vision.  Respiratory: Negative for cough, hemoptysis, sputum production, shortness of breath and wheezing.   Cardiovascular: Negative for chest pain, palpitations, orthopnea and claudication.  Gastrointestinal: Negative for abdominal pain, blood in stool, constipation, diarrhea, heartburn, melena, nausea and vomiting.  Genitourinary: Negative for dysuria, flank pain, frequency, hematuria and urgency.  Musculoskeletal: Negative for back pain, joint pain and myalgias.  Skin: Negative for rash.  Neurological: Negative for dizziness, tingling, focal weakness, seizures, weakness and headaches.  Endo/Heme/Allergies: Does not bruise/bleed easily.  Psychiatric/Behavioral: Negative for depression and suicidal ideas. The patient does not have insomnia.        No Known Allergies   Past Medical History:  Diagnosis Date   Cancer ([place])    Cataract    Diabetes mellitus without complication ([place])    Hyperlipidemia    Hypertension    Hypothyroidism    doctor's keeping an eye on thyroid      Past Surgical History:  Procedure Laterality Date   ABDOMINAL HYSTERECTOMY     PORTA CATH INSERTION N/A 11/30/2020   Procedure: PORTA CATH INSERTION;  Surgeon: Algernon Huxley, MD;  Location: [place] [facility];  Service: Cardiovascular;  Laterality: N/A;   VIDEO BRONCHOSCOPY WITH ENDOBRONCHIAL NAVIGATION N/A 10/21/2020   Procedure: VIDEO BRONCHOSCOPY WITH ENDOBRONCHIAL NAVIGATION;  Surgeon: Ottie Glazier, MD;  Location:  [facility];  Service: Thoracic;  Laterality: N/A;   VIDEO BRONCHOSCOPY WITH ENDOBRONCHIAL ULTRASOUND N/A 10/21/2020   Procedure: VIDEO BRONCHOSCOPY WITH ENDOBRONCHIAL ULTRASOUND;   Surgeon: Ottie Glazier, MD;  Location: [facility];  Service: Thoracic;  Laterality: N/A;    Social History   Socioeconomic History   Marital status: Single    Spouse name: Not on file   Number of children: Not on file   Years of education: Not on file   Highest education level: Not on file  Occupational History   Not on file  Tobacco Use   Smoking status: Never Smoker   Smokeless tobacco: Never Used  Vaping Use   Vaping Use: Never used  Substance and Sexual Activity   Alcohol use: No   Drug use: No   Sexual activity: Not on file  Other Topics Concern   Not on file  Social History Narrative   Not on file   Social Determinants of Health   Financial Resource Strain: Not on file  Food Insecurity: Not on file  Transportation Needs: Not on file  Physical Activity: Not on file  Stress: Not on file  Social Connections: Not on file  Intimate Partner Violence: Not on file    Family History  Problem Relation Age of Onset   Cancer Mother        breast   Hypertension Mother    Breast cancer Mother 76   Heart disease Father    Hyperlipidemia Brother    Heart disease Brother      Current Outpatient Medications:    docusate sodium (COLACE) 100 MG capsule, Take 200 mg by mouth at bedtime., Disp: , Rfl:    fluticasone (FLONASE) 50 MCG/ACT nasal spray, Place 2 sprays into both nostrils daily. (Patient taking differently: Place 2 sprays into both nostrils daily as needed for allergies.), Disp: 16 g, Rfl: 5   lidocaine-prilocaine (EMLA) cream, Apply 1 application topically as needed. Apply small amount to port site at least 1 hour prior to it being accessed, cover with plastic wrap, Disp: 30 g, Rfl: 1   lisinopril (PRINIVIL,ZESTRIL) 20 MG tablet, TAKE 1 TABLET BY MOUTH EVERY DAY (Patient taking differently: Take 20 mg by mouth every evening.), Disp: 30 tablet, Rfl: 0   LORazepam (ATIVAN) 0.5 MG tablet, Take 1 tablet (0.5 mg total) by mouth every 6 (six)  hours as needed (Nausea or vomiting). (Patient not taking: No sig reported), Disp: 30 tablet, Rfl: 0   metFORMIN (GLUCOPHAGE) 500 MG tablet, Take 1,000 mg by mouth 2 (two) times daily with a meal. , Disp: , Rfl:    ondansetron (ZOFRAN) 8 MG tablet, Take 1 tablet (8 mg total) by mouth 2 (two) times daily as needed for refractory nausea / vomiting. Start on day 3 after carboplatin chemo. (Patient not taking: No sig reported), Disp: 30 tablet, Rfl: 1   oxyCODONE (OXY IR/ROXICODONE) 5 MG immediate release tablet, Take 1 tablet (5 mg total) by mouth 3 (three) times daily as needed for severe pain., Disp: 45 tablet, Rfl: 0   potassium chloride SA (KLOR-CON) 20 MEQ tablet, Take 2 tablets (40 mEq total) by mouth daily. (Patient not taking: Reported on 12/22/2020), Disp: 7 tablet, Rfl: 0   prochlorperazine (COMPAZINE) 10 MG tablet, Take 1 tablet (10 mg total) by mouth every 6 (six) hours as needed (Nausea or vomiting). (Patient not taking: No sig reported), Disp: 30 tablet, Rfl: 1   rosuvastatin (CRESTOR) 10 MG tablet, Take 10 mg by mouth every evening. ,  Disp: , Rfl:    sennosides-docusate sodium (SENOKOT-S) 8.6-50 MG tablet, Take 1 tablet by mouth daily., Disp: , Rfl:    triamterene-hydrochlorothiazide (DYAZIDE) 37.5-25 MG capsule, TAKE ONE CAPSULE BY MOUTH DAILY (Patient taking differently: Take 1 capsule by mouth daily.), Disp: 30 capsule, Rfl: 0 No current facility-administered medications for this visit.  Facility-Administered Medications Ordered in Other Visits:    sodium chloride flush (NS) 0.9 % injection 10 mL, 10 mL, Intravenous, PRN, Sindy Guadeloupe, MD, 10 mL at 12/15/20 0850  Physical exam:  Vitals:   01/12/21 0855  BP: (!) 148/71  Pulse: 68  Resp: 16  Temp: (!) 96.3 F (35.7 C)  TempSrc: Tympanic  SpO2: 100%  Weight: 133 lb 4.8 oz (60.5 kg)   Physical Exam HENT:     Head: Normocephalic and atraumatic.  Eyes:     Extraocular Movements: EOM normal.     Pupils: Pupils are  equal, round, and reactive to light.  Cardiovascular:     Rate and Rhythm: Normal rate and regular rhythm.     Heart sounds: Normal heart sounds.  Pulmonary:     Effort: Pulmonary effort is normal.     Breath sounds: Normal breath sounds.  Abdominal:     General: Bowel sounds are normal.     Palpations: Abdomen is soft.  Musculoskeletal:     Cervical back: Normal range of motion.  Skin:    General: Skin is warm and dry.  Neurological:     Mental Status: She is alert and oriented to person, place, and time.      CMP Latest Ref Rng & Units 01/12/2021  Glucose 70 - 99 mg/dL 100(H)  BUN 8 - 23 mg/dL 10  Creatinine 0.44 - 1.00 mg/dL 0.52  Sodium 135 - 145 mmol/L 138  Potassium 3.5 - 5.1 mmol/L 3.9  Chloride 98 - 111 mmol/L 105  CO2 22 - 32 mmol/L 23  Calcium 8.9 - 10.3 mg/dL 9.5  Total Protein 6.5 - 8.1 g/dL 6.7  Total Bilirubin 0.3 - 1.2 mg/dL 0.5  Alkaline Phos 38 - 126 U/L 53  AST 15 - 41 U/L 14(L)  ALT 0 - 44 U/L 10   CBC Latest Ref Rng & Units 01/12/2021  WBC 4.0 - 10.5 K/uL 4.8  Hemoglobin 12.0 - 15.0 g/dL 10.6(L)  Hematocrit 36.0 - 46.0 % 32.0(L)  Platelets 150 - 400 K/uL 317    No images are attached to the encounter.  CT CHEST ABDOMEN PELVIS W CONTRAST  Result Date: 01/06/2021 CLINICAL DATA:  Small-cell lung cancer, mediastinal mass, ongoing chemotherapy, recent history of COVID 4-6 weeks ago EXAM: CT CHEST, ABDOMEN, AND PELVIS WITH CONTRAST TECHNIQUE: Multidetector CT imaging of the chest, abdomen and pelvis was performed following the standard protocol during bolus administration of intravenous contrast. CONTRAST:  121mL OMNIPAQUE IOHEXOL 300 MG/ML SOLN, additional oral enteric contrast COMPARISON:  PET-CT, 11/11/2020, CT chest, 09/29/2020 FINDINGS: CT CHEST FINDINGS Cardiovascular: Right chest port catheter. Normal heart size. No pericardial effusion. Mediastinum/Nodes: Redemonstrated bulky mediastinal mass centered about the AP window and encasing and significantly  narrowing the left pulmonary artery, measuring 7.2 x 6.1 cm, not significantly changed compared to prior examination (series 2, image 17). There are additional enlarged left superior mediastinal, prevascular, and cervical lymph nodes, index superior mediastinal node measuring 2.8 x 1.7 cm, not significantly changed (series 2, image 10). Redemonstrated enlarged, heterogeneous and nodular thyroid. In the setting of significant comorbidities or limited life expectancy, no follow-up recommended (ref: J Am Coll  Radiol. 2015 Feb;12(2): 143-50). Trachea is deflected to the right by bulky mass effect. Esophagus demonstrates no significant findings. Lungs/Pleura: Unchanged nodule of the medial left upper lobe measuring 1.0 x 0.7 cm (series 3, image 24). There is a new nodular opacity in the adjacent left upper lobe measuring 0.6 x 0.5 cm (series 3, image 24). Postobstructive consolidation of the medial left upper lobe (series 3, image 38). No pleural effusion or pneumothorax. Musculoskeletal: No chest wall mass or suspicious bone lesions identified. CT ABDOMEN PELVIS FINDINGS Hepatobiliary: Hypodense mass of the anterior right lobe of the liver, hepatic segment IVa, measuring 6.0 x 5.2 cm, slightly enlarged compared to prior examination at which time it measured 5.7 x 4.6 cm, although better appreciated at present following the administration of contrast (series 2, image 48). No new liver lesion. No gallstones, gallbladder wall thickening, or biliary dilatation. Pancreas: Unremarkable. No pancreatic ductal dilatation or surrounding inflammatory changes. Spleen: Normal in size without significant abnormality. Adrenals/Urinary Tract: Adrenal glands are unremarkable. Kidneys are normal, without renal calculi, solid lesion, or hydronephrosis. Bladder is unremarkable. Stomach/Bowel: Stomach is within normal limits. Appendix appears normal. No evidence of bowel wall thickening, distention, or inflammatory changes. Moderate burden  of stool and stool balls throughout the colon and rectum. Vascular/Lymphatic: No significant vascular findings are present. No enlarged abdominal or pelvic lymph nodes. Reproductive: Status post hysterectomy. Other: No abdominal wall hernia or abnormality. No abdominopelvic ascites. Musculoskeletal: No acute or significant osseous findings. IMPRESSION: 1. Redemonstrated bulky mediastinal mass centered about the AP window and encasing and significantly narrowing the left pulmonary artery,not significantly changed compared to prior examination. 2. There are additional enlarged left superior mediastinal, prevascular, and cervical lymph nodes, not significantly changed. 3. Unchanged nodule of the medial left upper lobe. 4. There is a new nodular opacity in the adjacent left upper lobe adjacent to the above nodule measuring 0.6 x 0.5 cm, nonspecific. This most likely reflects nonspecific infection or inflammation. Attention on follow-up. 5. Hypodense mass of the anterior right lobe of the liver, hepatic segment IVa, measuring 6.0 x 5.2 cm, slightly enlarged compared to prior examination at which time it measured 5.7 x 4.6 cm, although better appreciated at present following the administration of contrast. Findings are consistent with worsened hepatic metastatic disease. 6. No evidence of new metastatic disease in the abdomen or pelvis. Electronically Signed   By: Eddie Candle M.D.   On: 01/06/2021 09:26     Assessment and plan- Patient is a 70 y.o. female with extensive stage small cell lung cancer with brain and liver metastases here for on treatment assessment prior to cycle 3 of carbo etoposide Tecentriq chemotherapy and discuss CT scan results  Interval CT scan after 2 cycles of Carbo etoposide Tecentriq chemotherapy has not shown good response to treatment.  The primary bulky mediastinal mass which was close to 7.2 cm is not changed significantly since prior exam.  Mediastinal adenopathy is also remained  unchanged.  Left upper lobe lung nodule 1 x 0.7 cm unchanged.  Hypodense mass in the right lobe of the liver appears slightly larger at 6 x 5.2 cm as compared to 5.7 x 4.6 cm prior.   At this time I am inclined to continue my present [place] etoposide Tecentriq chemotherapy for 2 more cycles before getting a repeat scan.  I will refer her to radiation oncology however to see if there would be a role for palliative radiation therapy to her mediastinal mass and allow better shrinkage.  Down the  line patient will also need whole brain radiation treatment Given that her brain MRI showed 2 small foci of enhancement in the right cerebellar hemisphere  Chemo-induced anemia: Currently stable around 10.  Continue to monitor.  Will check ferritin iron studies B12 and folate with the next set of labs and I will see her back in 3 weeks for cycle 4 of carbo etoposide Tecentriq chemotherapy  Neoplasm related pain: Oxycodone prescription will be refilled today.  Opioid-induced constipation: I have asked her to add senna 2 tablets at night along with MiraLAX.     Visit Diagnosis 1. Encounter for antineoplastic chemotherapy   2. Encounter for antineoplastic immunotherapy   3. Small cell lung cancer ([place])      Dr. Randa Evens, MD, MPH [facility] [phone] 01/12/2021 8:47 AM

## 2021-01-13 ENCOUNTER — Inpatient Hospital Stay (HOSPITAL_BASED_OUTPATIENT_CLINIC_OR_DEPARTMENT_OTHER): Payer: Medicare HMO | Admitting: Hospice and Palliative Medicine

## 2021-01-13 ENCOUNTER — Inpatient Hospital Stay: Payer: Medicare HMO

## 2021-01-13 ENCOUNTER — Other Ambulatory Visit: Payer: Self-pay

## 2021-01-13 VITALS — BP 123/73 | HR 58 | Temp 99.0°F | Resp 18

## 2021-01-13 DIAGNOSIS — C787 Secondary malignant neoplasm of liver and intrahepatic bile duct: Secondary | ICD-10-CM | POA: Diagnosis not present

## 2021-01-13 DIAGNOSIS — G893 Neoplasm related pain (acute) (chronic): Secondary | ICD-10-CM | POA: Diagnosis not present

## 2021-01-13 DIAGNOSIS — C3490 Malignant neoplasm of unspecified part of unspecified bronchus or lung: Secondary | ICD-10-CM

## 2021-01-13 DIAGNOSIS — Z5112 Encounter for antineoplastic immunotherapy: Secondary | ICD-10-CM | POA: Diagnosis not present

## 2021-01-13 DIAGNOSIS — Z5111 Encounter for antineoplastic chemotherapy: Secondary | ICD-10-CM | POA: Diagnosis not present

## 2021-01-13 DIAGNOSIS — I959 Hypotension, unspecified: Secondary | ICD-10-CM

## 2021-01-13 DIAGNOSIS — R5383 Other fatigue: Secondary | ICD-10-CM | POA: Diagnosis not present

## 2021-01-13 DIAGNOSIS — Z515 Encounter for palliative care: Secondary | ICD-10-CM

## 2021-01-13 DIAGNOSIS — C7931 Secondary malignant neoplasm of brain: Secondary | ICD-10-CM | POA: Diagnosis not present

## 2021-01-13 DIAGNOSIS — C3412 Malignant neoplasm of upper lobe, left bronchus or lung: Secondary | ICD-10-CM | POA: Diagnosis not present

## 2021-01-13 DIAGNOSIS — R072 Precordial pain: Secondary | ICD-10-CM | POA: Diagnosis not present

## 2021-01-13 DIAGNOSIS — Z5189 Encounter for other specified aftercare: Secondary | ICD-10-CM | POA: Diagnosis not present

## 2021-01-13 DIAGNOSIS — C778 Secondary and unspecified malignant neoplasm of lymph nodes of multiple regions: Secondary | ICD-10-CM | POA: Diagnosis not present

## 2021-01-13 MED ORDER — HEPARIN SOD (PORK) LOCK FLUSH 100 UNIT/ML IV SOLN
500.0000 [IU] | Freq: Once | INTRAVENOUS | Status: AC | PRN
  Administered 2021-01-13: 500 [IU]
  Filled 2021-01-13: qty 5

## 2021-01-13 MED ORDER — SODIUM CHLORIDE 0.9 % IV SOLN
10.0000 mg | Freq: Once | INTRAVENOUS | Status: AC
  Administered 2021-01-13: 10 mg via INTRAVENOUS
  Filled 2021-01-13: qty 10

## 2021-01-13 MED ORDER — OXYCODONE HCL 10 MG PO TABS: 10.0000 mg | ORAL_TABLET | Freq: Four times a day (QID) | ORAL | 0 refills | Status: DC | PRN

## 2021-01-13 MED ORDER — SODIUM CHLORIDE 0.9 % IV SOLN
100.0000 mg/m2 | Freq: Once | INTRAVENOUS | Status: AC
  Administered 2021-01-13: 180 mg via INTRAVENOUS
  Filled 2021-01-13: qty 9

## 2021-01-13 MED ORDER — SODIUM CHLORIDE 0.9 % IV SOLN
Freq: Once | INTRAVENOUS | Status: AC
  Filled 2021-01-13: qty 250

## 2021-01-13 MED ORDER — HEPARIN SOD (PORK) LOCK FLUSH 100 UNIT/ML IV SOLN
INTRAVENOUS | Status: AC
  Filled 2021-01-13: qty 5

## 2021-01-13 NOTE — Progress Notes (Signed)
BP 92/57. Patient asymptomatic. Beckey Rutter, NP made aware. Per Beckey Rutter, NP, patient to receive 1L IVF, hold at-home BP meds and monitor BP tonight. Informed patient of this. Patient verbalizes understanding and denies any further questions or concerns.

## 2021-01-13 NOTE — Progress Notes (Signed)
[facility]  [phone] [phone]) [phone]   Name: Doris Johnson Date: 01/13/2021 MRN: [id number]  DOB: 10/02/1951  Patient Care Team: Leonel Ramsay, MD as PCP - General (Infectious Diseases) Telford Nab, RN as Oncology Nurse Navigator    REASON FOR CONSULTATION: Doris Johnson is a 70 y.o. female with multiple medical problems including extensive stage small cell lung cancer with liver and brain metastases.  Patient had multiple months of worsening midsternal pain and eventually had CT of the chest with contrast in October 2021 revealing a large mediastinal mass with severe compression of the left main pulmonary artery.  PET/CT revealed extensive disease involving the chest, neck, and liver.  Additionally, patient was found to have small brain mets on MRI.  She has been started on systemic chemo/immunotherapy with plan for future whole brain radiation.  She is referred to palliative care to help address goals and manage ongoing symptoms.  SOCIAL HISTORY:     reports that she has never smoked. She has never used smokeless tobacco. She reports that she does not drink alcohol and does not use drugs.  Patient is divorced and lives at home with her daughter and 2 granddaughters.  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  ADVANCE DIRECTIVES:  Does not have  CODE STATUS:   PAST MEDICAL HISTORY: Past Medical History:  Diagnosis Date  . Cancer ([place])   . Cataract   . Diabetes mellitus without complication ([place])   . Hyperlipidemia   . Hypertension   . Hypothyroidism    doctor's keeping an eye on thyroid     PAST SURGICAL HISTORY:  Past Surgical History:  Procedure Laterality Date  . ABDOMINAL HYSTERECTOMY    . PORTA CATH INSERTION N/A 11/30/2020   Procedure: PORTA CATH INSERTION;  Surgeon: Algernon Huxley, MD;  Location: [facility];  Service: Cardiovascular;  Laterality: N/A;  . VIDEO BRONCHOSCOPY WITH ENDOBRONCHIAL NAVIGATION N/A 10/21/2020   Procedure: VIDEO BRONCHOSCOPY WITH ENDOBRONCHIAL NAVIGATION;  Surgeon: Ottie Glazier, MD;  Location: [facility];  Service: Thoracic;  Laterality: N/A;  . VIDEO BRONCHOSCOPY WITH ENDOBRONCHIAL ULTRASOUND N/A 10/21/2020   Procedure: VIDEO BRONCHOSCOPY WITH ENDOBRONCHIAL ULTRASOUND;  Surgeon: Ottie Glazier, MD;  Location: [facility];  Service: Thoracic;  Laterality: N/A;    HEMATOLOGY/ONCOLOGY HISTORY:  Oncology History  Small cell lung cancer ([place])  10/30/2020 Initial Diagnosis   Small cell lung cancer ([place])   11/12/2020 Cancer Staging   Staging form: Lung, AJCC 8th Edition - Clinical: Stage IVB (cT4, cNX, pM1c) - Signed by Sindy Guadeloupe, MD on 11/12/2020   12/01/2020 -  Chemotherapy    Patient is on Treatment Plan: LUNG SCLC CARBOPLATIN + ETOPOSIDE + ATEZOLIZUMAB INDUCTION Q21D / ATEZOLIZUMAB MAINTENANCE Q21D        ALLERGIES:  has No Known Allergies.  MEDICATIONS:  Current Outpatient Medications  Medication Sig Dispense Refill  . docusate sodium (COLACE) 100 MG capsule Take 200 mg by mouth at bedtime.    . fluticasone (FLONASE) 50 MCG/ACT nasal spray Place 2 sprays into both nostrils daily. (Patient taking differently: Place 2 sprays into both nostrils daily as needed for allergies.) 16 g 5  . lidocaine-prilocaine (EMLA) cream Apply 1 application topically as needed. Apply small amount to port site at least 1 hour prior to it being accessed, cover with plastic wrap (Patient not  taking: Reported on 01/12/2021) 30 g 1  . lisinopril (PRINIVIL,ZESTRIL) 20 MG tablet TAKE 1 TABLET BY MOUTH EVERY DAY (Patient taking differently: Take 20 mg by mouth every evening.) 30 tablet 0  . LORazepam (ATIVAN) 0.5 MG tablet Take 1 tablet (0.5 mg total) by mouth every 6 (six) hours as needed (Nausea or vomiting). (Patient not taking: No sig reported) 30  tablet 0  . metFORMIN (GLUCOPHAGE) 500 MG tablet Take 1,000 mg by mouth 2 (two) times daily with a meal.     . ondansetron (ZOFRAN) 8 MG tablet Take 1 tablet (8 mg total) by mouth 2 (two) times daily as needed for refractory nausea / vomiting. Start on day 3 after carboplatin chemo. (Patient not taking: No sig reported) 30 tablet 1  . oxyCODONE (OXY IR/ROXICODONE) 5 MG immediate release tablet Take 1 tablet (5 mg total) by mouth 3 (three) times daily as needed for severe pain. 45 tablet 0  . polyethylene glycol powder (GLYCOLAX/MIRALAX) 17 GM/SCOOP powder Take by mouth.    . potassium chloride SA (KLOR-CON) 20 MEQ tablet Take 2 tablets (40 mEq total) by mouth daily. (Patient not taking: No sig reported) 7 tablet 0  . prochlorperazine (COMPAZINE) 10 MG tablet Take 1 tablet (10 mg total) by mouth every 6 (six) hours as needed (Nausea or vomiting). (Patient not taking: No sig reported) 30 tablet 1  . rosuvastatin (CRESTOR) 10 MG tablet Take 10 mg by mouth every evening.     . sennosides-docusate sodium (SENOKOT-S) 8.6-50 MG tablet Take 1 tablet by mouth daily.    Marland Kitchen triamterene-hydrochlorothiazide (DYAZIDE) 37.5-25 MG capsule TAKE ONE CAPSULE BY MOUTH DAILY (Patient taking differently: Take 1 capsule by mouth daily.) 30 capsule 0   No current facility-administered medications for this visit.   Facility-Administered Medications Ordered in Other Visits  Medication Dose Route Frequency Provider Last Rate Last Admin  . 0.9 %  sodium chloride infusion   Intravenous Once Verlon Au, NP 999 mL/hr at 01/13/21 1346 New Bag at 01/13/21 1346  . etoposide (VEPESID) 180 mg in sodium chloride 0.9 % 500 mL chemo infusion  100 mg/m2 (Treatment Plan Recorded) Intravenous Once Sindy Guadeloupe, MD      . heparin lock flush 100 unit/mL  500 Units Intracatheter Once PRN Sindy Guadeloupe, MD      . sodium chloride flush (NS) 0.9 % injection 10 mL  10 mL Intravenous PRN Sindy Guadeloupe, MD   10 mL at 12/15/20 0850     VITAL SIGNS: There were no vitals taken for this visit. There were no vitals filed for this visit.  Estimated body mass index is 21.52 kg/m as calculated from the following:   Height as of 11/30/20: 5\' 6"  (1.676 m).   Weight as of 01/12/21: 133 lb 4.8 oz (60.5 kg).  LABS: CBC:    Component Value Date/Time   WBC 4.8 01/12/2021 0821   HGB 10.6 (L) 01/12/2021 0821   HCT 32.0 (L) 01/12/2021 0821   PLT 317 01/12/2021 0821   MCV 89.9 01/12/2021 0821   NEUTROABS 3.4 01/12/2021 0821   LYMPHSABS 0.8 01/12/2021 0821   MONOABS 0.6 01/12/2021 0821   EOSABS 0.0 01/12/2021 0821   BASOSABS 0.0 01/12/2021 0821   Comprehensive Metabolic Panel:    Component Value Date/Time   NA 138 01/12/2021 0821   NA 141 03/09/2015 0000   K 3.9 01/12/2021 0821   CL 105 01/12/2021 0821   CO2 23 01/12/2021 0821   BUN 10 01/12/2021  6269   BUN 11 03/09/2015 0000   CREATININE 0.52 01/12/2021 0821   GLUCOSE 100 (H) 01/12/2021 0821   CALCIUM 9.5 01/12/2021 0821   AST 14 (L) 01/12/2021 0821   ALT 10 01/12/2021 0821   ALKPHOS 53 01/12/2021 0821   BILITOT 0.5 01/12/2021 0821   PROT 6.7 01/12/2021 0821   ALBUMIN 4.0 01/12/2021 0821    RADIOGRAPHIC STUDIES: CT CHEST ABDOMEN PELVIS W CONTRAST  Result Date: 01/06/2021 CLINICAL DATA:  Small-cell lung cancer, mediastinal mass, ongoing chemotherapy, recent history of COVID 4-6 weeks ago EXAM: CT CHEST, ABDOMEN, AND PELVIS WITH CONTRAST TECHNIQUE: Multidetector CT imaging of the chest, abdomen and pelvis was performed following the standard protocol during bolus administration of intravenous contrast. CONTRAST:  159mL OMNIPAQUE IOHEXOL 300 MG/ML SOLN, additional oral enteric contrast COMPARISON:  PET-CT, 11/11/2020, CT chest, 09/29/2020 FINDINGS: CT CHEST FINDINGS Cardiovascular: Right chest port catheter. Normal heart size. No pericardial effusion. Mediastinum/Nodes: Redemonstrated bulky mediastinal mass centered about the AP window and encasing and significantly  narrowing the left pulmonary artery, measuring 7.2 x 6.1 cm, not significantly changed compared to prior examination (series 2, image 17). There are additional enlarged left superior mediastinal, prevascular, and cervical lymph nodes, index superior mediastinal node measuring 2.8 x 1.7 cm, not significantly changed (series 2, image 10). Redemonstrated enlarged, heterogeneous and nodular thyroid. In the setting of significant comorbidities or limited life expectancy, no follow-up recommended (ref: J Am Coll Radiol. 2015 Feb;12(2): 143-50). Trachea is deflected to the right by bulky mass effect. Esophagus demonstrates no significant findings. Lungs/Pleura: Unchanged nodule of the medial left upper lobe measuring 1.0 x 0.7 cm (series 3, image 24). There is a new nodular opacity in the adjacent left upper lobe measuring 0.6 x 0.5 cm (series 3, image 24). Postobstructive consolidation of the medial left upper lobe (series 3, image 38). No pleural effusion or pneumothorax. Musculoskeletal: No chest wall mass or suspicious bone lesions identified. CT ABDOMEN PELVIS FINDINGS Hepatobiliary: Hypodense mass of the anterior right lobe of the liver, hepatic segment IVa, measuring 6.0 x 5.2 cm, slightly enlarged compared to prior examination at which time it measured 5.7 x 4.6 cm, although better appreciated at present following the administration of contrast (series 2, image 48). No new liver lesion. No gallstones, gallbladder wall thickening, or biliary dilatation. Pancreas: Unremarkable. No pancreatic ductal dilatation or surrounding inflammatory changes. Spleen: Normal in size without significant abnormality. Adrenals/Urinary Tract: Adrenal glands are unremarkable. Kidneys are normal, without renal calculi, solid lesion, or hydronephrosis. Bladder is unremarkable. Stomach/Bowel: Stomach is within normal limits. Appendix appears normal. No evidence of bowel wall thickening, distention, or inflammatory changes. Moderate burden  of stool and stool balls throughout the colon and rectum. Vascular/Lymphatic: No significant vascular findings are present. No enlarged abdominal or pelvic lymph nodes. Reproductive: Status post hysterectomy. Other: No abdominal wall hernia or abnormality. No abdominopelvic ascites. Musculoskeletal: No acute or significant osseous findings. IMPRESSION: 1. Redemonstrated bulky mediastinal mass centered about the AP window and encasing and significantly narrowing the left pulmonary artery,not significantly changed compared to prior examination. 2. There are additional enlarged left superior mediastinal, prevascular, and cervical lymph nodes, not significantly changed. 3. Unchanged nodule of the medial left upper lobe. 4. There is a new nodular opacity in the adjacent left upper lobe adjacent to the above nodule measuring 0.6 x 0.5 cm, nonspecific. This most likely reflects nonspecific infection or inflammation. Attention on follow-up. 5. Hypodense mass of the anterior right lobe of the liver, hepatic segment IVa, measuring 6.0  x 5.2 cm, slightly enlarged compared to prior examination at which time it measured 5.7 x 4.6 cm, although better appreciated at present following the administration of contrast. Findings are consistent with worsened hepatic metastatic disease. 6. No evidence of new metastatic disease in the abdomen or pelvis. Electronically Signed   By: Eddie Candle M.D.   On: 01/06/2021 09:26    PERFORMANCE STATUS (ECOG) : 1 - Symptomatic but completely ambulatory  Review of Systems Unless otherwise noted, a complete review of systems is negative.  Physical Exam General: NAD Pulmonary: Unlabored Extremities: no edema, no joint deformities Skin: no rashes Neurological: Weakness but otherwise nonfocal  IMPRESSION: Routine follow-up visit.  Patient seen in the infusion area.  Patient did not demonstrate interval treatment response on CT.  Patient reports that overall she feels she is doing well.   She does endorse generalized pain for which she is taking oxycodone 2-3 times a day.  She says that 1 tablet was not fully effective and so patient has been taking 2 tablets of the oxycodone.  She finds that the increased dose almost completely alleviates the pain.  Patient says that she is almost out of her pain medications.  It appears that Dr. Janese Banks refilled her oxycodone yesterday.  However, we will send a new Rx and increase to 10 mg tablets to lessen pill burden.  Patient says that she has the MOST Form and ACP documents at home.  She says that she knows she needs to think about decision-making but has delayed doing so and fear that talking about end-of-life might somehow bring it closer.  I told her that I would be happy to talk with her when she is ready about decision-making.  PLAN: -Continue current scope of treatment -Increase oxycodone 10 mg every 6 hours as needed for pain #60 -Consider starting a long-acting opioid if needed -Daily bowel regimen with Senokot -ACP/MOST form previously reviewed -MyChart visit with me in 3 to 4 weeks  Case discussed with Dr. Janese Banks   Patient expressed understanding and was in agreement with this plan. She also understands that She can call the clinic at any time with any questions, concerns, or complaints.     Time Total: 15 minutes  Visit consisted of counseling and education dealing with the complex and emotionally intense issues of symptom management and palliative care in the setting of serious and potentially life-threatening illness.Greater than 50%  of this time was spent counseling and coordinating care related to the above assessment and plan.  Signed by: Altha Harm, PhD, NP-C

## 2021-01-14 ENCOUNTER — Telehealth: Payer: Self-pay | Admitting: *Deleted

## 2021-01-14 ENCOUNTER — Inpatient Hospital Stay: Payer: Medicare HMO

## 2021-01-14 VITALS — BP 119/70 | HR 60 | Temp 96.8°F | Resp 16

## 2021-01-14 DIAGNOSIS — C787 Secondary malignant neoplasm of liver and intrahepatic bile duct: Secondary | ICD-10-CM | POA: Diagnosis not present

## 2021-01-14 DIAGNOSIS — C3490 Malignant neoplasm of unspecified part of unspecified bronchus or lung: Secondary | ICD-10-CM

## 2021-01-14 DIAGNOSIS — R072 Precordial pain: Secondary | ICD-10-CM | POA: Diagnosis not present

## 2021-01-14 DIAGNOSIS — C7931 Secondary malignant neoplasm of brain: Secondary | ICD-10-CM | POA: Diagnosis not present

## 2021-01-14 DIAGNOSIS — C3412 Malignant neoplasm of upper lobe, left bronchus or lung: Secondary | ICD-10-CM | POA: Diagnosis not present

## 2021-01-14 DIAGNOSIS — Z5111 Encounter for antineoplastic chemotherapy: Secondary | ICD-10-CM | POA: Diagnosis not present

## 2021-01-14 DIAGNOSIS — Z5189 Encounter for other specified aftercare: Secondary | ICD-10-CM | POA: Diagnosis not present

## 2021-01-14 DIAGNOSIS — Z5112 Encounter for antineoplastic immunotherapy: Secondary | ICD-10-CM | POA: Diagnosis not present

## 2021-01-14 DIAGNOSIS — R5383 Other fatigue: Secondary | ICD-10-CM | POA: Diagnosis not present

## 2021-01-14 DIAGNOSIS — C778 Secondary and unspecified malignant neoplasm of lymph nodes of multiple regions: Secondary | ICD-10-CM | POA: Diagnosis not present

## 2021-01-14 MED ORDER — HEPARIN SOD (PORK) LOCK FLUSH 100 UNIT/ML IV SOLN
INTRAVENOUS | Status: AC
  Filled 2021-01-14: qty 5

## 2021-01-14 MED ORDER — SODIUM CHLORIDE 0.9 % IV SOLN
100.0000 mg/m2 | Freq: Once | INTRAVENOUS | Status: AC
  Administered 2021-01-14: 180 mg via INTRAVENOUS
  Filled 2021-01-14: qty 9

## 2021-01-14 MED ORDER — HEPARIN SOD (PORK) LOCK FLUSH 100 UNIT/ML IV SOLN
500.0000 [IU] | Freq: Once | INTRAVENOUS | Status: AC | PRN
  Administered 2021-01-14: 500 [IU]
  Filled 2021-01-14: qty 5

## 2021-01-14 MED ORDER — SODIUM CHLORIDE 0.9 % IV SOLN
Freq: Once | INTRAVENOUS | Status: AC
  Filled 2021-01-14: qty 250

## 2021-01-14 MED ORDER — SODIUM CHLORIDE 0.9 % IV SOLN
10.0000 mg | Freq: Once | INTRAVENOUS | Status: AC
  Administered 2021-01-14: 10 mg via INTRAVENOUS
  Filled 2021-01-14: qty 10

## 2021-01-14 MED ORDER — PEGFILGRASTIM 6 MG/0.6ML ~~LOC~~ PSKT
6.0000 mg | PREFILLED_SYRINGE | Freq: Once | SUBCUTANEOUS | Status: AC
  Administered 2021-01-14: 6 mg via SUBCUTANEOUS
  Filled 2021-01-14: qty 0.6

## 2021-01-14 NOTE — Telephone Encounter (Signed)
Spoke with patient and her brother yesterday who requested to referral sent to [facility] for second opinion. Pt still wants to keep appts for treatment here at [facility] but would like second opinion as well. Referral sent to [facility] to schedule pt and pt made aware that she will be notified by their clinic when appt has been scheduled. Pt and her brother verbalized understanding.

## 2021-01-14 NOTE — Telephone Encounter (Signed)
Thank you :)

## 2021-01-14 NOTE — Telephone Encounter (Signed)
Debra with Dr Prentiss Bells office requesting to speak with Dr Rao;'s nurse

## 2021-01-14 NOTE — Progress Notes (Signed)
Nutrition Follow-up:  Patient with small cell lung cancer with mets to liver and brain.  Patient receiving chemotherapy.    Met with patient during infusion.  Patient has not eaten anything today (1:30pm) or drank ensure shake.  Says that appetite is better but just did not feel like getting up earlier enough to eat before coming to cancer center. Yesterday ate cereal and drank ensure. For lunch had baked chicken with beans and supper last night was [employer] chicken leg, 1/2 mashed potatoes and macaroni and cheese.  Patient says she is taking a medication to hep her appetite.  RD did not see appetite stimulant in medication list.      Medications: reviewed  Labs: reviewed  Anthropometrics:   Weight 133 lb on 3/1 decreased from 138 lb on 2/8  147 lb on 1/8   NUTRITION DIAGNOSIS: Inadequate oral intake continues   INTERVENTION:  Will send message to MD regarding request for trial of appetite stimulant as do not see medication listed on meds list. Recommend patient eat something or drink shake within 1-2 hours of waking up.  Offered more shakes today and declined     MONITORING, EVALUATION, GOAL: weight trends, intake   NEXT VISIT: March 24 during infusion  Jaekwon Mcclune B. Zenia Resides, [place], [place] Registered Dietitian [phone] (mobile)

## 2021-01-15 NOTE — Telephone Encounter (Signed)
I got a message stating to call dr Marval Regal office and I called and it went straight to voicemail and I left message for staff to call directly to [phone]

## 2021-01-15 NOTE — Telephone Encounter (Signed)
I called and all I got was voicemail and left message to call me directly [phone]

## 2021-01-18 ENCOUNTER — Other Ambulatory Visit: Payer: Self-pay

## 2021-01-18 ENCOUNTER — Ambulatory Visit
Admission: RE | Admit: 2021-01-18 | Discharge: 2021-01-18 | Disposition: A | Discharge status: Home / Self Care | Payer: Medicare HMO | Source: Ambulatory Visit | Attending: Radiation Oncology | Admitting: Radiation Oncology

## 2021-01-18 VITALS — BP 113/78 | HR 78 | Temp 96.5°F | Wt 133.5 lb

## 2021-01-18 DIAGNOSIS — C3490 Malignant neoplasm of unspecified part of unspecified bronchus or lung: Secondary | ICD-10-CM | POA: Diagnosis not present

## 2021-01-18 DIAGNOSIS — C7931 Secondary malignant neoplasm of brain: Secondary | ICD-10-CM | POA: Diagnosis not present

## 2021-01-18 NOTE — Consult Note (Signed)
NEW PATIENT EVALUATION  Name: Doris Johnson  MRN: [id number]  Date:   01/18/2021     DOB: 07-03-1951   This 70 y.o. female patient presents to the clinic for initial evaluation of palliative radiation therapy to her mediastinum for large area of metastatic small cell lung cancer in patient with known extensive stage disease.  REFERRING PHYSICIAN: Leonel Ramsay, MD  CHIEF COMPLAINT:  Chief Complaint  Patient presents with  . Consult    DIAGNOSIS: The encounter diagnosis was Small cell lung cancer ([place]).   PREVIOUS INVESTIGATIONS:  CT scans PET CT scans reviewed Clinical notes reviewed Pathology report reviewed  HPI: Patient is a 70 year old female with known extensive stage small cell lung cancer with liver and brain Johnson.  She initially presented with a 6.7 x 6.2 cm mediastinal mass causing compression of the left main pulmonary artery and left subclavian artery.  Biopsy was positive for small cell undifferentiated carcinoma.  Brain MRI showed at least 2 small foci of enhancement the right cerebellar hemisphere and right temporal lobe consistent with Doris Johnson.  She also has a 5.7 cm liver mass consistent with metastatic disease.  She has been treated with [place] etoposide and Tecentriq although no major change in the mediastinal mass.  She continues to have narcotic dependent pain from the mediastinal mass.  She is referred to ration collagen for consideration of palliative treatment to that area.  She is seen today she specifically denies hemoptysis does have chest tightness and a intermittent cough.  PLANNED TREATMENT REGIMEN: Palliative radiation therapy to chest  PAST MEDICAL HISTORY:  has a past medical history of Cancer ([place]), Cataract, Diabetes mellitus without complication ([place]), Hyperlipidemia, Hypertension, and Hypothyroidism.    PAST SURGICAL HISTORY:  Past Surgical History:  Procedure Laterality Date  . ABDOMINAL HYSTERECTOMY    . PORTA CATH  INSERTION N/A 11/30/2020   Procedure: PORTA CATH INSERTION;  Surgeon: Algernon Huxley, MD;  Location: [place] [facility];  Service: Cardiovascular;  Laterality: N/A;  . VIDEO BRONCHOSCOPY WITH ENDOBRONCHIAL NAVIGATION N/A 10/21/2020   Procedure: VIDEO BRONCHOSCOPY WITH ENDOBRONCHIAL NAVIGATION;  Surgeon: Ottie Glazier, MD;  Location: [facility];  Service: Thoracic;  Laterality: N/A;  . VIDEO BRONCHOSCOPY WITH ENDOBRONCHIAL ULTRASOUND N/A 10/21/2020   Procedure: VIDEO BRONCHOSCOPY WITH ENDOBRONCHIAL ULTRASOUND;  Surgeon: Ottie Glazier, MD;  Location: [facility];  Service: Thoracic;  Laterality: N/A;    FAMILY HISTORY: family history includes Breast cancer (age of onset: 24) in her mother; Cancer in her mother; Heart disease in her brother and father; Hyperlipidemia in her brother; Hypertension in her mother.  SOCIAL HISTORY:  reports that she has never smoked. She has never used smokeless tobacco. She reports that she does not drink alcohol and does not use drugs.  ALLERGIES: Patient has no known allergies.  MEDICATIONS:  Current Outpatient Medications  Medication Sig Dispense Refill  . docusate sodium (COLACE) 100 MG capsule Take 200 mg by mouth at bedtime.    . fluticasone (FLONASE) 50 MCG/ACT nasal spray Place 2 sprays into both nostrils daily. (Patient taking differently: Place 2 sprays into both nostrils daily as needed for allergies.) 16 g 5  . lisinopril (PRINIVIL,ZESTRIL) 20 MG tablet TAKE 1 TABLET BY MOUTH EVERY DAY (Patient taking differently: Take 20 mg by mouth every evening.) 30 tablet 0  . metFORMIN (GLUCOPHAGE) 500 MG tablet Take 1,000 mg by mouth 2 (two) times daily with a meal.     . Oxycodone HCl 10 MG TABS Take 1 tablet (  10 mg total) by mouth every 6 (six) hours as needed (pain). 60 tablet 0  . polyethylene glycol powder (GLYCOLAX/MIRALAX) 17 GM/SCOOP powder Take by mouth.    . rosuvastatin (CRESTOR) 10 MG tablet Take 10 mg by mouth every evening.     . sennosides-docusate  sodium (SENOKOT-S) 8.6-50 MG tablet Take 1 tablet by mouth daily.    Marland Kitchen triamterene-hydrochlorothiazide (DYAZIDE) 37.5-25 MG capsule TAKE ONE CAPSULE BY MOUTH DAILY (Patient taking differently: Take 1 capsule by mouth daily.) 30 capsule 0  . lidocaine-prilocaine (EMLA) cream Apply 1 application topically as needed. Apply small amount to port site at least 1 hour prior to it being accessed, cover with plastic wrap (Patient not taking: No sig reported) 30 g 1  . LORazepam (ATIVAN) 0.5 MG tablet Take 1 tablet (0.5 mg total) by mouth every 6 (six) hours as needed (Nausea or vomiting). (Patient not taking: No sig reported) 30 tablet 0  . ondansetron (ZOFRAN) 8 MG tablet Take 1 tablet (8 mg total) by mouth 2 (two) times daily as needed for refractory nausea / vomiting. Start on day 3 after carboplatin chemo. (Patient not taking: No sig reported) 30 tablet 1  . potassium chloride SA (KLOR-CON) 20 MEQ tablet Take 2 tablets (40 mEq total) by mouth daily. (Patient not taking: No sig reported) 7 tablet 0  . prochlorperazine (COMPAZINE) 10 MG tablet Take 1 tablet (10 mg total) by mouth every 6 (six) hours as needed (Nausea or vomiting). (Patient not taking: No sig reported) 30 tablet 1   No current facility-administered medications for this encounter.   Facility-Administered Medications Ordered in Other Encounters  Medication Dose Route Frequency Provider Last Rate Last Admin  . sodium chloride flush (NS) 0.9 % injection 10 mL  10 mL Intravenous PRN Sindy Guadeloupe, MD   10 mL at 12/15/20 0850    ECOG PERFORMANCE STATUS:  1 - Symptomatic but completely ambulatory  REVIEW OF SYSTEMS: Patient denies any weight loss, fatigue, weakness, fever, chills or night sweats. Patient denies any loss of vision, blurred vision. Patient denies any ringing  of the ears or hearing loss. No irregular heartbeat. Patient denies heart murmur or history of fainting. Patient denies any chest pain or pain radiating to her upper  extremities. Patient denies any shortness of breath, difficulty breathing at night, cough or hemoptysis. Patient denies any swelling in the lower legs. Patient denies any nausea vomiting, vomiting of blood, or coffee ground material in the vomitus. Patient denies any stomach pain. Patient states has had normal bowel movements no significant constipation or diarrhea. Patient denies any dysuria, hematuria or significant nocturia. Patient denies any problems walking, swelling in the joints or loss of balance. Patient denies any skin changes, loss of hair or loss of weight. Patient denies any excessive worrying or anxiety or significant depression. Patient denies any problems with insomnia. Patient denies excessive thirst, polyuria, polydipsia. Patient denies any swollen glands, patient denies easy bruising or easy bleeding. Patient denies any recent infections, allergies or URI. Patient "s visual fields have not changed significantly in recent time.   PHYSICAL EXAM: BP 113/78   Pulse 78   Temp (!) 96.5 F (35.8 C) (Tympanic)   Wt 133 lb 8 oz (60.6 kg)   BMI 21.55 kg/m  Well-developed well-nourished patient in NAD. HEENT reveals PERLA, EOMI, discs not visualized.  Oral cavity is clear. No oral mucosal lesions are identified. Neck is clear without evidence of cervical or supraclavicular adenopathy. Lungs are clear to A&P. Cardiac  examination is essentially unremarkable with regular rate and rhythm without murmur rub or thrill. Abdomen is benign with no organomegaly or masses noted. Motor sensory and DTR levels are equal and symmetric in the upper and lower extremities. Cranial nerves II through XII are grossly intact. Proprioception is intact. No peripheral adenopathy or edema is identified. No motor or sensory levels are noted. Crude visual fields are within normal range.  LABORATORY DATA: Pathology report reviewed    RADIOLOGY RESULTS: CT scans and PET CT scans reviewed compatible with above-stated  findings   IMPRESSION: At this time elected ahead with a palliative course of radiation therapy to her chest.  We will plan on delivering 40 Gray in 20 fractions and evaluate for response.  I anticipate based on the small cell histology this will respond well to radiation treatment.  Risks and benefits of treatment occluding possible radiation esophagitis fatigue skin reaction alteration of blood counts and development of a cough all were discussed in detail with the patient.  She seems to comprehend my treatment plan well.  I have personally set up and ordered CT simulation for this week.  I would like to take this opportunity to thank you for allowing me to participate in the care of your patient.Marland Kitchen  PLAN: As above  Noreene Filbert, MD

## 2021-01-20 ENCOUNTER — Ambulatory Visit
Admission: RE | Admit: 2021-01-20 | Discharge: 2021-01-20 | Disposition: A | Discharge status: Home / Self Care | Payer: Medicare HMO | Source: Ambulatory Visit | Attending: Radiation Oncology | Admitting: Radiation Oncology

## 2021-01-20 DIAGNOSIS — C3492 Malignant neoplasm of unspecified part of left bronchus or lung: Secondary | ICD-10-CM | POA: Diagnosis not present

## 2021-01-20 DIAGNOSIS — C3490 Malignant neoplasm of unspecified part of unspecified bronchus or lung: Secondary | ICD-10-CM | POA: Diagnosis not present

## 2021-01-20 DIAGNOSIS — C7931 Secondary malignant neoplasm of brain: Secondary | ICD-10-CM | POA: Insufficient documentation

## 2021-01-20 DIAGNOSIS — Z510 Encounter for antineoplastic radiation therapy: Secondary | ICD-10-CM | POA: Insufficient documentation

## 2021-01-21 DIAGNOSIS — C3492 Malignant neoplasm of unspecified part of left bronchus or lung: Secondary | ICD-10-CM | POA: Diagnosis not present

## 2021-01-21 DIAGNOSIS — Z87891 Personal history of nicotine dependence: Secondary | ICD-10-CM | POA: Diagnosis not present

## 2021-01-21 DIAGNOSIS — Z79899 Other long term (current) drug therapy: Secondary | ICD-10-CM | POA: Diagnosis not present

## 2021-01-25 ENCOUNTER — Other Ambulatory Visit: Payer: Medicare HMO

## 2021-01-25 ENCOUNTER — Ambulatory Visit: Payer: Medicare HMO | Admitting: Oncology

## 2021-01-25 ENCOUNTER — Ambulatory Visit: Payer: Medicare HMO

## 2021-01-25 DIAGNOSIS — C3492 Malignant neoplasm of unspecified part of left bronchus or lung: Secondary | ICD-10-CM | POA: Diagnosis not present

## 2021-01-25 DIAGNOSIS — R896 Abnormal cytological findings in specimens from other organs, systems and tissues: Secondary | ICD-10-CM | POA: Diagnosis not present

## 2021-01-25 DIAGNOSIS — Z510 Encounter for antineoplastic radiation therapy: Secondary | ICD-10-CM | POA: Diagnosis not present

## 2021-01-25 DIAGNOSIS — C3490 Malignant neoplasm of unspecified part of unspecified bronchus or lung: Secondary | ICD-10-CM | POA: Diagnosis not present

## 2021-01-25 DIAGNOSIS — C7931 Secondary malignant neoplasm of brain: Secondary | ICD-10-CM | POA: Diagnosis not present

## 2021-01-27 ENCOUNTER — Ambulatory Visit: Admission: RE | Admit: 2021-01-27 | Payer: Medicare HMO | Source: Ambulatory Visit

## 2021-01-27 DIAGNOSIS — C7931 Secondary malignant neoplasm of brain: Secondary | ICD-10-CM | POA: Diagnosis not present

## 2021-01-27 DIAGNOSIS — C3490 Malignant neoplasm of unspecified part of unspecified bronchus or lung: Secondary | ICD-10-CM | POA: Diagnosis not present

## 2021-01-27 DIAGNOSIS — C3492 Malignant neoplasm of unspecified part of left bronchus or lung: Secondary | ICD-10-CM | POA: Diagnosis not present

## 2021-01-27 DIAGNOSIS — Z510 Encounter for antineoplastic radiation therapy: Secondary | ICD-10-CM | POA: Diagnosis not present

## 2021-01-28 ENCOUNTER — Ambulatory Visit
Admission: RE | Admit: 2021-01-28 | Discharge: 2021-01-28 | Disposition: A | Discharge status: Home / Self Care | Payer: Medicare HMO | Source: Ambulatory Visit | Attending: Radiation Oncology | Admitting: Radiation Oncology

## 2021-01-28 DIAGNOSIS — C3492 Malignant neoplasm of unspecified part of left bronchus or lung: Secondary | ICD-10-CM | POA: Diagnosis not present

## 2021-01-28 DIAGNOSIS — C3490 Malignant neoplasm of unspecified part of unspecified bronchus or lung: Secondary | ICD-10-CM | POA: Diagnosis not present

## 2021-01-28 DIAGNOSIS — Z510 Encounter for antineoplastic radiation therapy: Secondary | ICD-10-CM | POA: Diagnosis not present

## 2021-01-28 DIAGNOSIS — C7931 Secondary malignant neoplasm of brain: Secondary | ICD-10-CM | POA: Diagnosis not present

## 2021-01-29 ENCOUNTER — Ambulatory Visit
Admission: RE | Admit: 2021-01-29 | Discharge: 2021-01-29 | Disposition: A | Discharge status: Home / Self Care | Payer: Medicare HMO | Source: Ambulatory Visit | Attending: Radiation Oncology | Admitting: Radiation Oncology

## 2021-01-29 DIAGNOSIS — C3490 Malignant neoplasm of unspecified part of unspecified bronchus or lung: Secondary | ICD-10-CM | POA: Diagnosis not present

## 2021-01-29 DIAGNOSIS — C3492 Malignant neoplasm of unspecified part of left bronchus or lung: Secondary | ICD-10-CM | POA: Diagnosis not present

## 2021-01-29 DIAGNOSIS — Z510 Encounter for antineoplastic radiation therapy: Secondary | ICD-10-CM | POA: Diagnosis not present

## 2021-01-29 DIAGNOSIS — C7931 Secondary malignant neoplasm of brain: Secondary | ICD-10-CM | POA: Diagnosis not present

## 2021-02-01 ENCOUNTER — Other Ambulatory Visit: Payer: Self-pay | Admitting: *Deleted

## 2021-02-01 ENCOUNTER — Ambulatory Visit
Admission: RE | Admit: 2021-02-01 | Discharge: 2021-02-01 | Disposition: A | Discharge status: Home / Self Care | Payer: Medicare HMO | Source: Ambulatory Visit | Attending: Radiation Oncology | Admitting: Radiation Oncology

## 2021-02-01 DIAGNOSIS — C3490 Malignant neoplasm of unspecified part of unspecified bronchus or lung: Secondary | ICD-10-CM | POA: Diagnosis not present

## 2021-02-01 DIAGNOSIS — Z510 Encounter for antineoplastic radiation therapy: Secondary | ICD-10-CM | POA: Diagnosis not present

## 2021-02-01 DIAGNOSIS — C3492 Malignant neoplasm of unspecified part of left bronchus or lung: Secondary | ICD-10-CM | POA: Diagnosis not present

## 2021-02-01 DIAGNOSIS — C7931 Secondary malignant neoplasm of brain: Secondary | ICD-10-CM | POA: Diagnosis not present

## 2021-02-01 MED ORDER — OXYCODONE HCL 10 MG PO TABS: 10.0000 mg | ORAL_TABLET | Freq: Four times a day (QID) | ORAL | 0 refills | Status: DC | PRN

## 2021-02-02 ENCOUNTER — Inpatient Hospital Stay (HOSPITAL_BASED_OUTPATIENT_CLINIC_OR_DEPARTMENT_OTHER): Payer: Medicare HMO | Admitting: Oncology

## 2021-02-02 ENCOUNTER — Ambulatory Visit
Admission: RE | Admit: 2021-02-02 | Discharge: 2021-02-02 | Disposition: A | Discharge status: Home / Self Care | Payer: Medicare HMO | Source: Ambulatory Visit | Attending: Radiation Oncology | Admitting: Radiation Oncology

## 2021-02-02 ENCOUNTER — Inpatient Hospital Stay: Payer: Medicare HMO

## 2021-02-02 ENCOUNTER — Encounter: Payer: Self-pay | Admitting: Oncology

## 2021-02-02 VITALS — BP 98/73 | HR 84 | Temp 97.8°F | Wt 124.1 lb

## 2021-02-02 DIAGNOSIS — D6481 Anemia due to antineoplastic chemotherapy: Secondary | ICD-10-CM | POA: Diagnosis not present

## 2021-02-02 DIAGNOSIS — Z5111 Encounter for antineoplastic chemotherapy: Secondary | ICD-10-CM | POA: Diagnosis not present

## 2021-02-02 DIAGNOSIS — T451X5A Adverse effect of antineoplastic and immunosuppressive drugs, initial encounter: Secondary | ICD-10-CM

## 2021-02-02 DIAGNOSIS — C787 Secondary malignant neoplasm of liver and intrahepatic bile duct: Secondary | ICD-10-CM | POA: Diagnosis not present

## 2021-02-02 DIAGNOSIS — Z5189 Encounter for other specified aftercare: Secondary | ICD-10-CM | POA: Diagnosis not present

## 2021-02-02 DIAGNOSIS — Z5112 Encounter for antineoplastic immunotherapy: Secondary | ICD-10-CM

## 2021-02-02 DIAGNOSIS — C778 Secondary and unspecified malignant neoplasm of lymph nodes of multiple regions: Secondary | ICD-10-CM | POA: Diagnosis not present

## 2021-02-02 DIAGNOSIS — C3490 Malignant neoplasm of unspecified part of unspecified bronchus or lung: Secondary | ICD-10-CM

## 2021-02-02 DIAGNOSIS — C3492 Malignant neoplasm of unspecified part of left bronchus or lung: Secondary | ICD-10-CM | POA: Diagnosis not present

## 2021-02-02 DIAGNOSIS — C7931 Secondary malignant neoplasm of brain: Secondary | ICD-10-CM | POA: Diagnosis not present

## 2021-02-02 DIAGNOSIS — R072 Precordial pain: Secondary | ICD-10-CM | POA: Diagnosis not present

## 2021-02-02 DIAGNOSIS — R5383 Other fatigue: Secondary | ICD-10-CM | POA: Diagnosis not present

## 2021-02-02 DIAGNOSIS — C3412 Malignant neoplasm of upper lobe, left bronchus or lung: Secondary | ICD-10-CM | POA: Diagnosis not present

## 2021-02-02 DIAGNOSIS — Z510 Encounter for antineoplastic radiation therapy: Secondary | ICD-10-CM | POA: Diagnosis not present

## 2021-02-02 LAB — CBC WITH DIFFERENTIAL/PLATELET
Abs Immature Granulocytes: 0.02 10*3/uL (ref 0.00–0.07)
Basophils Absolute: 0 10*3/uL (ref 0.0–0.1)
Basophils Relative: 0 %
Eosinophils Absolute: 0 10*3/uL (ref 0.0–0.5)
Eosinophils Relative: 0 %
HCT: 32.4 % — ABNORMAL LOW (ref 36.0–46.0)
Hemoglobin: 10.9 g/dL — ABNORMAL LOW (ref 12.0–15.0)
Immature Granulocytes: 1 %
Lymphocytes Relative: 16 %
Lymphs Abs: 0.7 10*3/uL (ref 0.7–4.0)
MCH: 30.9 pg (ref 26.0–34.0)
MCHC: 33.6 g/dL (ref 30.0–36.0)
MCV: 91.8 fL (ref 80.0–100.0)
Monocytes Absolute: 0.4 10*3/uL (ref 0.1–1.0)
Monocytes Relative: 10 %
Neutro Abs: 3 10*3/uL (ref 1.7–7.7)
Neutrophils Relative %: 73 %
Platelets: 314 10*3/uL (ref 150–400)
RBC: 3.53 MIL/uL — ABNORMAL LOW (ref 3.87–5.11)
RDW: 21.1 % — ABNORMAL HIGH (ref 11.5–15.5)
WBC: 4.1 10*3/uL (ref 4.0–10.5)
nRBC: 0 % (ref 0.0–0.2)

## 2021-02-02 LAB — IRON AND TIBC
Iron: 49 ug/dL (ref 28–170)
Saturation Ratios: 15 % (ref 10.4–31.8)
TIBC: 329 ug/dL (ref 250–450)
UIBC: 280 ug/dL

## 2021-02-02 LAB — FERRITIN: Ferritin: 223 ng/mL (ref 11–307)

## 2021-02-02 LAB — COMPREHENSIVE METABOLIC PANEL
ALT: 10 U/L (ref 0–44)
AST: 17 U/L (ref 15–41)
Albumin: 4 g/dL (ref 3.5–5.0)
Alkaline Phosphatase: 48 U/L (ref 38–126)
Anion gap: 8 (ref 5–15)
BUN: 13 mg/dL (ref 8–23)
CO2: 24 mmol/L (ref 22–32)
Calcium: 9.7 mg/dL (ref 8.9–10.3)
Chloride: 103 mmol/L (ref 98–111)
Creatinine, Ser: 0.48 mg/dL (ref 0.44–1.00)
GFR, Estimated: >60 mL/min (ref >60)
Glucose, Bld: 124 mg/dL — ABNORMAL HIGH (ref 70–99)
Potassium: 3.4 mmol/L — ABNORMAL LOW (ref 3.5–5.1)
Sodium: 135 mmol/L (ref 135–145)
Total Bilirubin: 0.7 mg/dL (ref 0.3–1.2)
Total Protein: 6.8 g/dL (ref 6.5–8.1)

## 2021-02-02 LAB — VITAMIN B12: Vitamin B-12: 2151 pg/mL — ABNORMAL HIGH (ref 180–914)

## 2021-02-02 LAB — FOLATE: Folate: 6 ng/mL (ref >5.9)

## 2021-02-02 LAB — TSH: TSH: 0.41 u[IU]/mL (ref 0.350–4.500)

## 2021-02-02 MED ORDER — SODIUM CHLORIDE 0.9 % IV SOLN
10.0000 mg | Freq: Once | INTRAVENOUS | Status: AC
  Administered 2021-02-02: 10 mg via INTRAVENOUS
  Filled 2021-02-02: qty 10

## 2021-02-02 MED ORDER — HEPARIN SOD (PORK) LOCK FLUSH 100 UNIT/ML IV SOLN
INTRAVENOUS | Status: AC
  Filled 2021-02-02: qty 5

## 2021-02-02 MED ORDER — SODIUM CHLORIDE 0.9 % IV SOLN
150.0000 mg | Freq: Once | INTRAVENOUS | Status: AC
  Administered 2021-02-02: 150 mg via INTRAVENOUS
  Filled 2021-02-02: qty 150

## 2021-02-02 MED ORDER — PALONOSETRON HCL INJECTION 0.25 MG/5ML
0.2500 mg | Freq: Once | INTRAVENOUS | Status: AC
  Administered 2021-02-02: 0.25 mg via INTRAVENOUS
  Filled 2021-02-02: qty 5

## 2021-02-02 MED ORDER — SODIUM CHLORIDE 0.9 % IV SOLN
100.0000 mg/m2 | Freq: Once | INTRAVENOUS | Status: AC
  Administered 2021-02-02: 180 mg via INTRAVENOUS
  Filled 2021-02-02: qty 9

## 2021-02-02 MED ORDER — SODIUM CHLORIDE 0.9 % IV SOLN
400.5000 mg | Freq: Once | INTRAVENOUS | Status: AC
Start: 2021-02-02 — End: 2021-02-02
  Administered 2021-02-02: 400 mg via INTRAVENOUS
  Filled 2021-02-02: qty 40

## 2021-02-02 MED ORDER — SODIUM CHLORIDE 0.9 % IV SOLN
Freq: Once | INTRAVENOUS | Status: AC
  Filled 2021-02-02: qty 250

## 2021-02-02 MED ORDER — HEPARIN SOD (PORK) LOCK FLUSH 100 UNIT/ML IV SOLN
500.0000 [IU] | Freq: Once | INTRAVENOUS | Status: AC | PRN
  Administered 2021-02-02: 500 [IU]
  Filled 2021-02-02: qty 5

## 2021-02-02 NOTE — Progress Notes (Signed)
Hematology/Oncology Consult note [facility]  Telephone:([phone] [phone]  Patient Care Team: Leonel Ramsay, MD as PCP - General (Infectious Diseases) Telford Nab, RN as Oncology Nurse Navigator   Name of the patient: Doris Johnson  [id number]  01/08/1951   Date of visit: 02/02/21  Diagnosis- extensive stage small cell lung cancer with liver and brain metastases   Chief complaint/ Reason for visit-on treatment assessment prior to cycle 4 of carboplatin and etoposide chemotherapy  Heme/Onc history: Patient is a 70 year old female with a remote history of smoking in her teenage years and exposure to passive smoking. She has been having ongoing midsternal pain which has been growing since the last 6 to 7 months. She had been to urgent care as well in the past. She finally had a CT chest with contrast done in October 2021 which showed a large mass in the left side of the mediastinum measuring 6.7 x 6.2 x 6.1 cm causing severe compression of the left main pulmonary artery and around the aortic arch in the left subclavian artery. Enlarged thyroid gland. Left upper lobe nodule 1 x 0.7 cm. She then underwent bronchoscopy with Dr.Aleskerov left upper lobe biopsy was nondiagnostic. However station 4, 7 and 10 L lymph nodes were consistent with metastatic small cell carcinoma.  PET CT scan showed enlarged level 3 cervical lymph node 2.2 cm that was hypermetabolic with an SUV of 9.7. Large central 7.3 cm AP window mass. 5.7 cm left liver mass. MRI brain with and without contrast showed 2 small foci of enhancement in the right cerebellar hemisphere and right temporal lobe without mass-effect or edema as well concerning for brain metastases   Interval history-patient reports baseline fatigue.  Has intermittent midsternal chest pain for which she uses her pain medications.  Appetite is fair.  Occasional nausea which is well controlled with  medications  ECOG PS- 1 Pain scale- 2 Opioid associated constipation- no  Review of systems- Review of Systems  Constitutional: Positive for malaise/fatigue. Negative for chills, fever and weight loss.  HENT: Negative for congestion, ear discharge and nosebleeds.   Eyes: Negative for blurred vision.  Respiratory: Negative for cough, hemoptysis, sputum production, shortness of breath and wheezing.   Cardiovascular: Negative for chest pain, palpitations, orthopnea and claudication.  Gastrointestinal: Negative for abdominal pain, blood in stool, constipation, diarrhea, heartburn, melena, nausea and vomiting.  Genitourinary: Negative for dysuria, flank pain, frequency, hematuria and urgency.  Musculoskeletal: Negative for back pain, joint pain and myalgias.  Skin: Negative for rash.  Neurological: Negative for dizziness, tingling, focal weakness, seizures, weakness and headaches.  Endo/Heme/Allergies: Does not bruise/bleed easily.  Psychiatric/Behavioral: Negative for depression and suicidal ideas. The patient does not have insomnia.       No Known Allergies   Past Medical History:  Diagnosis Date  . Cancer ([place])   . Cataract   . Diabetes mellitus without complication ([place])   . Hyperlipidemia   . Hypertension   . Hypothyroidism    doctor's keeping an eye on thyroid      Past Surgical History:  Procedure Laterality Date  . ABDOMINAL HYSTERECTOMY    . PORTA CATH INSERTION N/A 11/30/2020   Procedure: PORTA CATH INSERTION;  Surgeon: Algernon Huxley, MD;  Location: [place] [facility];  Service: Cardiovascular;  Laterality: N/A;  . VIDEO BRONCHOSCOPY WITH ENDOBRONCHIAL NAVIGATION N/A 10/21/2020   Procedure: VIDEO BRONCHOSCOPY WITH ENDOBRONCHIAL NAVIGATION;  Surgeon: Ottie Glazier, MD;  Location: [facility];  Service: Thoracic;  Laterality: N/A;  . VIDEO BRONCHOSCOPY WITH ENDOBRONCHIAL ULTRASOUND N/A 10/21/2020   Procedure: VIDEO BRONCHOSCOPY WITH ENDOBRONCHIAL ULTRASOUND;  Surgeon:  Ottie Glazier, MD;  Location: [facility];  Service: Thoracic;  Laterality: N/A;    Social History   Socioeconomic History  . Marital status: Single    Spouse name: Not on file  . Number of children: Not on file  . Years of education: Not on file  . Highest education level: Not on file  Occupational History  . Not on file  Tobacco Use  . Smoking status: Never Smoker  . Smokeless tobacco: Never Used  Vaping Use  . Vaping Use: Never used  Substance and Sexual Activity  . Alcohol use: No  . Drug use: No  . Sexual activity: Not on file  Other Topics Concern  . Not on file  Social History Narrative  . Not on file   Social Determinants of Health   Financial Resource Strain: Not on file  Food Insecurity: Not on file  Transportation Needs: Not on file  Physical Activity: Not on file  Stress: Not on file  Social Connections: Not on file  Intimate Partner Violence: Not on file    Family History  Problem Relation Age of Onset  . Cancer Mother        breast  . Hypertension Mother   . Breast cancer Mother 88  . Heart disease Father   . Hyperlipidemia Brother   . Heart disease Brother      Current Outpatient Medications:  .  docusate sodium (COLACE) 100 MG capsule, Take 200 mg by mouth at bedtime., Disp: , Rfl:  .  fluticasone (FLONASE) 50 MCG/ACT nasal spray, Place 2 sprays into both nostrils daily. (Patient taking differently: Place 2 sprays into both nostrils daily as needed for allergies.), Disp: 16 g, Rfl: 5 .  lidocaine-prilocaine (EMLA) cream, Apply 1 application topically as needed. Apply small amount to port site at least 1 hour prior to it being accessed, cover with plastic wrap, Disp: 30 g, Rfl: 1 .  lisinopril (PRINIVIL,ZESTRIL) 20 MG tablet, TAKE 1 TABLET BY MOUTH EVERY DAY (Patient taking differently: Take 20 mg by mouth every evening.), Disp: 30 tablet, Rfl: 0 .  metFORMIN (GLUCOPHAGE) 500 MG tablet, Take 1,000 mg by mouth 2 (two) times daily with a meal. ,  Disp: , Rfl:  .  Oxycodone HCl 10 MG TABS, Take 1 tablet (10 mg total) by mouth every 6 (six) hours as needed (pain)., Disp: 60 tablet, Rfl: 0 .  rosuvastatin (CRESTOR) 10 MG tablet, Take 10 mg by mouth every evening. , Disp: , Rfl:  .  sennosides-docusate sodium (SENOKOT-S) 8.6-50 MG tablet, Take 1 tablet by mouth daily., Disp: , Rfl:  .  triamterene-hydrochlorothiazide (DYAZIDE) 37.5-25 MG capsule, TAKE ONE CAPSULE BY MOUTH DAILY (Patient taking differently: Take 1 capsule by mouth daily.), Disp: 30 capsule, Rfl: 0 .  LORazepam (ATIVAN) 0.5 MG tablet, Take 1 tablet (0.5 mg total) by mouth every 6 (six) hours as needed (Nausea or vomiting). (Patient not taking: Reported on 02/02/2021), Disp: 30 tablet, Rfl: 0 .  ondansetron (ZOFRAN) 8 MG tablet, Take 1 tablet (8 mg total) by mouth 2 (two) times daily as needed for refractory nausea / vomiting. Start on day 3 after carboplatin chemo. (Patient not taking: No sig reported), Disp: 30 tablet, Rfl: 1 .  potassium chloride SA (KLOR-CON) 20 MEQ tablet, Take 2 tablets (40 mEq total) by mouth daily. (Patient not taking: No sig reported), Disp: 7  tablet, Rfl: 0 .  prochlorperazine (COMPAZINE) 10 MG tablet, Take 1 tablet (10 mg total) by mouth every 6 (six) hours as needed (Nausea or vomiting). (Patient not taking: No sig reported), Disp: 30 tablet, Rfl: 1 No current facility-administered medications for this visit.  Facility-Administered Medications Ordered in Other Visits:  .  sodium chloride flush (NS) 0.9 % injection 10 mL, 10 mL, Intravenous, PRN, Sindy Guadeloupe, MD, 10 mL at 12/15/20 0850  Physical exam:  Vitals:   02/02/21 0842  BP: 98/73  Pulse: 84  Temp: 97.8 F (36.6 C)  TempSrc: Tympanic  SpO2: 100%  Weight: 124 lb 1.6 oz (56.3 kg)   Physical Exam Constitutional:      General: She is not in acute distress. Cardiovascular:     Rate and Rhythm: Normal rate and regular rhythm.     Heart sounds: Normal heart sounds.  Pulmonary:     Effort:  Pulmonary effort is normal.     Breath sounds: Normal breath sounds.  Skin:    General: Skin is warm and dry.  Neurological:     Mental Status: She is alert and oriented to person, place, and time.      CMP Latest Ref Rng & Units 02/02/2021  Glucose 70 - 99 mg/dL 124(H)  BUN 8 - 23 mg/dL 13  Creatinine 0.44 - 1.00 mg/dL 0.48  Sodium 135 - 145 mmol/L 135  Potassium 3.5 - 5.1 mmol/L 3.4(L)  Chloride 98 - 111 mmol/L 103  CO2 22 - 32 mmol/L 24  Calcium 8.9 - 10.3 mg/dL 9.7  Total Protein 6.5 - 8.1 g/dL 6.8  Total Bilirubin 0.3 - 1.2 mg/dL 0.7  Alkaline Phos 38 - 126 U/L 48  AST 15 - 41 U/L 17  ALT 0 - 44 U/L 10   CBC Latest Ref Rng & Units 02/02/2021  WBC 4.0 - 10.5 K/uL 4.1  Hemoglobin 12.0 - 15.0 g/dL 10.9(L)  Hematocrit 36.0 - 46.0 % 32.4(L)  Platelets 150 - 400 K/uL 314    No images are attached to the encounter.  CT CHEST ABDOMEN PELVIS W CONTRAST  Result Date: 01/06/2021 CLINICAL DATA:  Small-cell lung cancer, mediastinal mass, ongoing chemotherapy, recent history of COVID 4-6 weeks ago EXAM: CT CHEST, ABDOMEN, AND PELVIS WITH CONTRAST TECHNIQUE: Multidetector CT imaging of the chest, abdomen and pelvis was performed following the standard protocol during bolus administration of intravenous contrast. CONTRAST:  114mL OMNIPAQUE IOHEXOL 300 MG/ML SOLN, additional oral enteric contrast COMPARISON:  PET-CT, 11/11/2020, CT chest, 09/29/2020 FINDINGS: CT CHEST FINDINGS Cardiovascular: Right chest port catheter. Normal heart size. No pericardial effusion. Mediastinum/Nodes: Redemonstrated bulky mediastinal mass centered about the AP window and encasing and significantly narrowing the left pulmonary artery, measuring 7.2 x 6.1 cm, not significantly changed compared to prior examination (series 2, image 17). There are additional enlarged left superior mediastinal, prevascular, and cervical lymph nodes, index superior mediastinal node measuring 2.8 x 1.7 cm, not significantly changed (series  2, image 10). Redemonstrated enlarged, heterogeneous and nodular thyroid. In the setting of significant comorbidities or limited life expectancy, no follow-up recommended (ref: J Am Coll Radiol. 2015 Feb;12(2): 143-50). Trachea is deflected to the right by bulky mass effect. Esophagus demonstrates no significant findings. Lungs/Pleura: Unchanged nodule of the medial left upper lobe measuring 1.0 x 0.7 cm (series 3, image 24). There is a new nodular opacity in the adjacent left upper lobe measuring 0.6 x 0.5 cm (series 3, image 24). Postobstructive consolidation of the medial left upper lobe (series  3, image 38). No pleural effusion or pneumothorax. Musculoskeletal: No chest wall mass or suspicious bone lesions identified. CT ABDOMEN PELVIS FINDINGS Hepatobiliary: Hypodense mass of the anterior right lobe of the liver, hepatic segment IVa, measuring 6.0 x 5.2 cm, slightly enlarged compared to prior examination at which time it measured 5.7 x 4.6 cm, although better appreciated at present following the administration of contrast (series 2, image 48). No new liver lesion. No gallstones, gallbladder wall thickening, or biliary dilatation. Pancreas: Unremarkable. No pancreatic ductal dilatation or surrounding inflammatory changes. Spleen: Normal in size without significant abnormality. Adrenals/Urinary Tract: Adrenal glands are unremarkable. Kidneys are normal, without renal calculi, solid lesion, or hydronephrosis. Bladder is unremarkable. Stomach/Bowel: Stomach is within normal limits. Appendix appears normal. No evidence of bowel wall thickening, distention, or inflammatory changes. Moderate burden of stool and stool balls throughout the colon and rectum. Vascular/Lymphatic: No significant vascular findings are present. No enlarged abdominal or pelvic lymph nodes. Reproductive: Status post hysterectomy. Other: No abdominal wall hernia or abnormality. No abdominopelvic ascites. Musculoskeletal: No acute or significant  osseous findings. IMPRESSION: 1. Redemonstrated bulky mediastinal mass centered about the AP window and encasing and significantly narrowing the left pulmonary artery,not significantly changed compared to prior examination. 2. There are additional enlarged left superior mediastinal, prevascular, and cervical lymph nodes, not significantly changed. 3. Unchanged nodule of the medial left upper lobe. 4. There is a new nodular opacity in the adjacent left upper lobe adjacent to the above nodule measuring 0.6 x 0.5 cm, nonspecific. This most likely reflects nonspecific infection or inflammation. Attention on follow-up. 5. Hypodense mass of the anterior right lobe of the liver, hepatic segment IVa, measuring 6.0 x 5.2 cm, slightly enlarged compared to prior examination at which time it measured 5.7 x 4.6 cm, although better appreciated at present following the administration of contrast. Findings are consistent with worsened hepatic metastatic disease. 6. No evidence of new metastatic disease in the abdomen or pelvis. Electronically Signed   By: Eddie Candle M.D.   On: 01/06/2021 09:26     Assessment and plan- Patient is a 70 y.o. female with extensive stage small cell lung cancer with liver metastases here for on treatment assessment prior to cycle 4 of carbo etoposide chemotherapy  Patient was seen by Dr. Durenda Hurt at [facility] for second opinion for her small cell lung cancer.  He agreed with ongoing treatment as well as plans to initiate radiation given minimal response after 2 cycles of carbo etoposide chemotherapy.  They will also be reviewing her pathology again at [facility].  I will follow up on that  Patient will be completing radiation to her mediastinal mass on 02/24/2021.  She will proceed with cycle 4 of carbo etoposide chemotherapy today and received etoposide on day 2 and day 3 with on pro-Neulasta support.  However I will hold off on giving her Tecentriq given that she is going through radiation therapy  presently.  I will see her back in 4 weeks with CBC with differential CMP and TSH to start Tecentriq.  Plan to repeat CT chest abdomen and pelvis with contrast 5 to 6 weeks from now  Chemo-induced anemia: Stable continue to monitor   Visit Diagnosis 1. Small cell lung cancer ([place])   2. Encounter for antineoplastic chemotherapy   3. Antineoplastic chemotherapy induced anemia      Dr. Randa Evens, MD, MPH [facility] [phone] 02/02/2021 12:42 PM

## 2021-02-03 ENCOUNTER — Inpatient Hospital Stay: Payer: Medicare HMO

## 2021-02-03 ENCOUNTER — Ambulatory Visit
Admission: RE | Admit: 2021-02-03 | Discharge: 2021-02-03 | Disposition: A | Discharge status: Home / Self Care | Payer: Medicare HMO | Source: Ambulatory Visit | Attending: Radiation Oncology | Admitting: Radiation Oncology

## 2021-02-03 VITALS — BP 111/62 | HR 62 | Temp 97.2°F

## 2021-02-03 DIAGNOSIS — C3412 Malignant neoplasm of upper lobe, left bronchus or lung: Secondary | ICD-10-CM | POA: Diagnosis not present

## 2021-02-03 DIAGNOSIS — C3490 Malignant neoplasm of unspecified part of unspecified bronchus or lung: Secondary | ICD-10-CM

## 2021-02-03 DIAGNOSIS — R5383 Other fatigue: Secondary | ICD-10-CM | POA: Diagnosis not present

## 2021-02-03 DIAGNOSIS — Z5189 Encounter for other specified aftercare: Secondary | ICD-10-CM | POA: Diagnosis not present

## 2021-02-03 DIAGNOSIS — Z5111 Encounter for antineoplastic chemotherapy: Secondary | ICD-10-CM | POA: Diagnosis not present

## 2021-02-03 DIAGNOSIS — Z510 Encounter for antineoplastic radiation therapy: Secondary | ICD-10-CM | POA: Diagnosis not present

## 2021-02-03 DIAGNOSIS — C787 Secondary malignant neoplasm of liver and intrahepatic bile duct: Secondary | ICD-10-CM | POA: Diagnosis not present

## 2021-02-03 DIAGNOSIS — C3492 Malignant neoplasm of unspecified part of left bronchus or lung: Secondary | ICD-10-CM | POA: Diagnosis not present

## 2021-02-03 DIAGNOSIS — R072 Precordial pain: Secondary | ICD-10-CM | POA: Diagnosis not present

## 2021-02-03 DIAGNOSIS — C778 Secondary and unspecified malignant neoplasm of lymph nodes of multiple regions: Secondary | ICD-10-CM | POA: Diagnosis not present

## 2021-02-03 DIAGNOSIS — C7931 Secondary malignant neoplasm of brain: Secondary | ICD-10-CM | POA: Diagnosis not present

## 2021-02-03 DIAGNOSIS — Z5112 Encounter for antineoplastic immunotherapy: Secondary | ICD-10-CM | POA: Diagnosis not present

## 2021-02-03 MED ORDER — HEPARIN SOD (PORK) LOCK FLUSH 100 UNIT/ML IV SOLN
500.0000 [IU] | Freq: Once | INTRAVENOUS | Status: AC | PRN
  Administered 2021-02-03: 500 [IU]
  Filled 2021-02-03: qty 5

## 2021-02-03 MED ORDER — SODIUM CHLORIDE 0.9 % IV SOLN
10.0000 mg | Freq: Once | INTRAVENOUS | Status: AC
  Administered 2021-02-03: 10 mg via INTRAVENOUS
  Filled 2021-02-03: qty 10

## 2021-02-03 MED ORDER — HEPARIN SOD (PORK) LOCK FLUSH 100 UNIT/ML IV SOLN
INTRAVENOUS | Status: AC
  Filled 2021-02-03: qty 5

## 2021-02-03 MED ORDER — SODIUM CHLORIDE 0.9 % IV SOLN: 180.0000 mg | Freq: Once | INTRAVENOUS | Status: DC

## 2021-02-03 MED ORDER — SODIUM CHLORIDE 0.9% FLUSH
10.0000 mL | INTRAVENOUS | Status: DC | PRN
  Filled 2021-02-03: qty 10

## 2021-02-03 MED ORDER — SODIUM CHLORIDE 0.9 % IV SOLN
160.0000 mg | Freq: Once | INTRAVENOUS | Status: AC
  Administered 2021-02-03: 160 mg via INTRAVENOUS
  Filled 2021-02-03: qty 8

## 2021-02-03 MED ORDER — SODIUM CHLORIDE 0.9 % IV SOLN
Freq: Once | INTRAVENOUS | Status: AC
Start: 2021-02-03 — End: 2021-02-03
  Filled 2021-02-03: qty 250

## 2021-02-03 NOTE — Progress Notes (Signed)
Weight change >10%.  Update todays weight for dose per MD

## 2021-02-04 ENCOUNTER — Inpatient Hospital Stay: Payer: Medicare HMO

## 2021-02-04 ENCOUNTER — Ambulatory Visit
Admission: RE | Admit: 2021-02-04 | Discharge: 2021-02-04 | Disposition: A | Discharge status: Home / Self Care | Payer: Medicare HMO | Source: Ambulatory Visit | Attending: Radiation Oncology | Admitting: Radiation Oncology

## 2021-02-04 VITALS — BP 92/63 | HR 65 | Temp 97.8°F | Wt 131.2 lb

## 2021-02-04 DIAGNOSIS — K5900 Constipation, unspecified: Secondary | ICD-10-CM | POA: Diagnosis not present

## 2021-02-04 DIAGNOSIS — R5383 Other fatigue: Secondary | ICD-10-CM | POA: Diagnosis not present

## 2021-02-04 DIAGNOSIS — Z5112 Encounter for antineoplastic immunotherapy: Secondary | ICD-10-CM | POA: Diagnosis not present

## 2021-02-04 DIAGNOSIS — E119 Type 2 diabetes mellitus without complications: Secondary | ICD-10-CM | POA: Diagnosis not present

## 2021-02-04 DIAGNOSIS — C7931 Secondary malignant neoplasm of brain: Secondary | ICD-10-CM | POA: Diagnosis not present

## 2021-02-04 DIAGNOSIS — C778 Secondary and unspecified malignant neoplasm of lymph nodes of multiple regions: Secondary | ICD-10-CM | POA: Diagnosis not present

## 2021-02-04 DIAGNOSIS — C3490 Malignant neoplasm of unspecified part of unspecified bronchus or lung: Secondary | ICD-10-CM

## 2021-02-04 DIAGNOSIS — Z510 Encounter for antineoplastic radiation therapy: Secondary | ICD-10-CM | POA: Diagnosis not present

## 2021-02-04 DIAGNOSIS — C3492 Malignant neoplasm of unspecified part of left bronchus or lung: Secondary | ICD-10-CM | POA: Diagnosis not present

## 2021-02-04 DIAGNOSIS — C787 Secondary malignant neoplasm of liver and intrahepatic bile duct: Secondary | ICD-10-CM | POA: Diagnosis not present

## 2021-02-04 DIAGNOSIS — R072 Precordial pain: Secondary | ICD-10-CM | POA: Diagnosis not present

## 2021-02-04 DIAGNOSIS — Z5189 Encounter for other specified aftercare: Secondary | ICD-10-CM | POA: Diagnosis not present

## 2021-02-04 DIAGNOSIS — R634 Abnormal weight loss: Secondary | ICD-10-CM | POA: Diagnosis not present

## 2021-02-04 DIAGNOSIS — C3412 Malignant neoplasm of upper lobe, left bronchus or lung: Secondary | ICD-10-CM | POA: Diagnosis not present

## 2021-02-04 DIAGNOSIS — Z5111 Encounter for antineoplastic chemotherapy: Secondary | ICD-10-CM | POA: Diagnosis not present

## 2021-02-04 DIAGNOSIS — I10 Essential (primary) hypertension: Secondary | ICD-10-CM | POA: Diagnosis not present

## 2021-02-04 MED ORDER — HEPARIN SOD (PORK) LOCK FLUSH 100 UNIT/ML IV SOLN
INTRAVENOUS | Status: AC
  Filled 2021-02-04: qty 5

## 2021-02-04 MED ORDER — SODIUM CHLORIDE 0.9 % IV SOLN
100.0000 mg/m2 | Freq: Once | INTRAVENOUS | Status: AC
  Administered 2021-02-04: 160 mg via INTRAVENOUS
  Filled 2021-02-04: qty 8

## 2021-02-04 MED ORDER — SODIUM CHLORIDE 0.9 % IV SOLN
Freq: Once | INTRAVENOUS | Status: AC
  Filled 2021-02-04: qty 250

## 2021-02-04 MED ORDER — SODIUM CHLORIDE 0.9 % IV SOLN
10.0000 mg | Freq: Once | INTRAVENOUS | Status: AC
  Administered 2021-02-04: 10 mg via INTRAVENOUS
  Filled 2021-02-04: qty 10

## 2021-02-04 MED ORDER — PEGFILGRASTIM 6 MG/0.6ML ~~LOC~~ PSKT
6.0000 mg | PREFILLED_SYRINGE | Freq: Once | SUBCUTANEOUS | Status: AC
  Administered 2021-02-04: 6 mg via SUBCUTANEOUS
  Filled 2021-02-04: qty 0.6

## 2021-02-04 MED ORDER — HEPARIN SOD (PORK) LOCK FLUSH 100 UNIT/ML IV SOLN
500.0000 [IU] | Freq: Once | INTRAVENOUS | Status: AC | PRN
  Administered 2021-02-04: 500 [IU]
  Filled 2021-02-04: qty 5

## 2021-02-04 NOTE — Progress Notes (Signed)
Nutrition Follow-up:  Patient with small cell lung cancer with mets to liver and brain.  Patient receiving chemotherapy and radiation.   Met with patient during infusion.  Patient reports that she is trying to eat a little bit more.  Reports that she drank 2 ensure today so far and has eaten 1/2 of chicken breast and some potato salad.  Patient typically when seen by RD in the afternoon has not eaten anything.  Last night ate chicken, macaroni and cheese and salad. Likes the ensure original better than high calorie one.        Medications: reviewed  Labs: reviewed  Anthropometrics:   Weight 124 lb on 3/22 133 lb on 3/1 138 lb on 2/8 147 lb on 1/8   NUTRITION DIAGNOSIS: Inadequate oral intake continues   INTERVENTION:  Congratulated patient on eating before coming to treatment.   Encouraged patient to continue ensure original 2-3 per day if likes better.      MONITORING, EVALUATION, GOAL: weight trends, intake   NEXT VISIT: to be determined with treatment  Santasia Rew B. Zenia Resides, [place], [place] Registered Dietitian [phone] (mobile)

## 2021-02-05 ENCOUNTER — Ambulatory Visit
Admission: RE | Admit: 2021-02-05 | Discharge: 2021-02-05 | Disposition: A | Discharge status: Home / Self Care | Payer: Medicare HMO | Source: Ambulatory Visit | Attending: Radiation Oncology | Admitting: Radiation Oncology

## 2021-02-05 DIAGNOSIS — C3492 Malignant neoplasm of unspecified part of left bronchus or lung: Secondary | ICD-10-CM | POA: Diagnosis not present

## 2021-02-05 DIAGNOSIS — C7931 Secondary malignant neoplasm of brain: Secondary | ICD-10-CM | POA: Diagnosis not present

## 2021-02-05 DIAGNOSIS — Z510 Encounter for antineoplastic radiation therapy: Secondary | ICD-10-CM | POA: Diagnosis not present

## 2021-02-05 DIAGNOSIS — C3490 Malignant neoplasm of unspecified part of unspecified bronchus or lung: Secondary | ICD-10-CM | POA: Diagnosis not present

## 2021-02-08 ENCOUNTER — Ambulatory Visit
Admission: RE | Admit: 2021-02-08 | Discharge: 2021-02-08 | Disposition: A | Discharge status: Home / Self Care | Payer: Medicare HMO | Source: Ambulatory Visit | Attending: Radiation Oncology | Admitting: Radiation Oncology

## 2021-02-08 DIAGNOSIS — C3490 Malignant neoplasm of unspecified part of unspecified bronchus or lung: Secondary | ICD-10-CM | POA: Diagnosis not present

## 2021-02-08 DIAGNOSIS — Z510 Encounter for antineoplastic radiation therapy: Secondary | ICD-10-CM | POA: Diagnosis not present

## 2021-02-08 DIAGNOSIS — C7931 Secondary malignant neoplasm of brain: Secondary | ICD-10-CM | POA: Diagnosis not present

## 2021-02-08 DIAGNOSIS — C3492 Malignant neoplasm of unspecified part of left bronchus or lung: Secondary | ICD-10-CM | POA: Diagnosis not present

## 2021-02-09 ENCOUNTER — Ambulatory Visit
Admission: RE | Admit: 2021-02-09 | Discharge: 2021-02-09 | Disposition: A | Discharge status: Home / Self Care | Payer: Medicare HMO | Source: Ambulatory Visit | Attending: Radiation Oncology | Admitting: Radiation Oncology

## 2021-02-09 DIAGNOSIS — C7931 Secondary malignant neoplasm of brain: Secondary | ICD-10-CM | POA: Diagnosis not present

## 2021-02-09 DIAGNOSIS — C3490 Malignant neoplasm of unspecified part of unspecified bronchus or lung: Secondary | ICD-10-CM | POA: Diagnosis not present

## 2021-02-09 DIAGNOSIS — C3492 Malignant neoplasm of unspecified part of left bronchus or lung: Secondary | ICD-10-CM | POA: Diagnosis not present

## 2021-02-09 DIAGNOSIS — Z510 Encounter for antineoplastic radiation therapy: Secondary | ICD-10-CM | POA: Diagnosis not present

## 2021-02-10 ENCOUNTER — Other Ambulatory Visit: Payer: Self-pay | Admitting: *Deleted

## 2021-02-10 ENCOUNTER — Ambulatory Visit
Admission: RE | Admit: 2021-02-10 | Discharge: 2021-02-10 | Disposition: A | Discharge status: Home / Self Care | Payer: Medicare HMO | Source: Ambulatory Visit | Attending: Radiation Oncology | Admitting: Radiation Oncology

## 2021-02-10 ENCOUNTER — Telehealth: Payer: Self-pay | Admitting: *Deleted

## 2021-02-10 DIAGNOSIS — Z510 Encounter for antineoplastic radiation therapy: Secondary | ICD-10-CM | POA: Diagnosis not present

## 2021-02-10 DIAGNOSIS — C3492 Malignant neoplasm of unspecified part of left bronchus or lung: Secondary | ICD-10-CM | POA: Diagnosis not present

## 2021-02-10 DIAGNOSIS — C3490 Malignant neoplasm of unspecified part of unspecified bronchus or lung: Secondary | ICD-10-CM | POA: Diagnosis not present

## 2021-02-10 DIAGNOSIS — C7931 Secondary malignant neoplasm of brain: Secondary | ICD-10-CM | POA: Diagnosis not present

## 2021-02-10 MED ORDER — OXYCODONE HCL ER 10 MG PO T12A
10.0000 mg | EXTENDED_RELEASE_TABLET | Freq: Two times a day (BID) | ORAL | 0 refills | Status: DC
Start: 2021-02-10 — End: 2021-03-11

## 2021-02-10 NOTE — Telephone Encounter (Signed)
Pt asking if her pain medication can be increased. She continues to have pain in the "middle of her chest" that is not well controlled with current pain medication. She has been taking oxycodone 10mg  every 6 hours for pain.   Please advise.

## 2021-02-10 NOTE — Telephone Encounter (Signed)
Message left with patient that new prescription for oxycontin will be sent into her pharmacy. Instructed to start taking twice a day and may take oxycodone every 6 hours as needed for breakthrough pain. Pt instructed to call back with any further questions or needs.

## 2021-02-10 NOTE — Telephone Encounter (Signed)
We can add oxycontin 10 mg BID and keep oxycodone the same

## 2021-02-11 ENCOUNTER — Ambulatory Visit
Admission: RE | Admit: 2021-02-11 | Discharge: 2021-02-11 | Disposition: A | Discharge status: Home / Self Care | Payer: Medicare HMO | Source: Ambulatory Visit | Attending: Radiation Oncology | Admitting: Radiation Oncology

## 2021-02-11 DIAGNOSIS — Z510 Encounter for antineoplastic radiation therapy: Secondary | ICD-10-CM | POA: Diagnosis not present

## 2021-02-11 DIAGNOSIS — C3490 Malignant neoplasm of unspecified part of unspecified bronchus or lung: Secondary | ICD-10-CM | POA: Diagnosis not present

## 2021-02-11 DIAGNOSIS — C7931 Secondary malignant neoplasm of brain: Secondary | ICD-10-CM | POA: Diagnosis not present

## 2021-02-11 DIAGNOSIS — C3492 Malignant neoplasm of unspecified part of left bronchus or lung: Secondary | ICD-10-CM | POA: Diagnosis not present

## 2021-02-12 ENCOUNTER — Ambulatory Visit
Admission: RE | Admit: 2021-02-12 | Discharge: 2021-02-12 | Disposition: A | Discharge status: Home / Self Care | Payer: Medicare HMO | Source: Ambulatory Visit | Attending: Radiation Oncology | Admitting: Radiation Oncology

## 2021-02-12 DIAGNOSIS — C3490 Malignant neoplasm of unspecified part of unspecified bronchus or lung: Secondary | ICD-10-CM | POA: Diagnosis not present

## 2021-02-12 DIAGNOSIS — C3492 Malignant neoplasm of unspecified part of left bronchus or lung: Secondary | ICD-10-CM | POA: Diagnosis not present

## 2021-02-12 DIAGNOSIS — Z510 Encounter for antineoplastic radiation therapy: Secondary | ICD-10-CM | POA: Diagnosis not present

## 2021-02-12 DIAGNOSIS — C7931 Secondary malignant neoplasm of brain: Secondary | ICD-10-CM | POA: Insufficient documentation

## 2021-02-15 ENCOUNTER — Ambulatory Visit
Admission: RE | Admit: 2021-02-15 | Discharge: 2021-02-15 | Disposition: A | Discharge status: Home / Self Care | Payer: Medicare HMO | Source: Ambulatory Visit | Attending: Radiation Oncology | Admitting: Radiation Oncology

## 2021-02-15 DIAGNOSIS — C3490 Malignant neoplasm of unspecified part of unspecified bronchus or lung: Secondary | ICD-10-CM | POA: Diagnosis not present

## 2021-02-15 DIAGNOSIS — Z510 Encounter for antineoplastic radiation therapy: Secondary | ICD-10-CM | POA: Diagnosis not present

## 2021-02-15 DIAGNOSIS — C3492 Malignant neoplasm of unspecified part of left bronchus or lung: Secondary | ICD-10-CM | POA: Diagnosis not present

## 2021-02-15 DIAGNOSIS — C7931 Secondary malignant neoplasm of brain: Secondary | ICD-10-CM | POA: Diagnosis not present

## 2021-02-16 ENCOUNTER — Ambulatory Visit
Admission: RE | Admit: 2021-02-16 | Discharge: 2021-02-16 | Disposition: A | Discharge status: Home / Self Care | Payer: Medicare HMO | Source: Ambulatory Visit | Attending: Radiation Oncology | Admitting: Radiation Oncology

## 2021-02-16 DIAGNOSIS — C3490 Malignant neoplasm of unspecified part of unspecified bronchus or lung: Secondary | ICD-10-CM | POA: Diagnosis not present

## 2021-02-16 DIAGNOSIS — C7931 Secondary malignant neoplasm of brain: Secondary | ICD-10-CM | POA: Diagnosis not present

## 2021-02-16 DIAGNOSIS — C3492 Malignant neoplasm of unspecified part of left bronchus or lung: Secondary | ICD-10-CM | POA: Diagnosis not present

## 2021-02-16 DIAGNOSIS — Z510 Encounter for antineoplastic radiation therapy: Secondary | ICD-10-CM | POA: Diagnosis not present

## 2021-02-16 DIAGNOSIS — E119 Type 2 diabetes mellitus without complications: Secondary | ICD-10-CM | POA: Diagnosis not present

## 2021-02-17 ENCOUNTER — Ambulatory Visit
Admission: RE | Admit: 2021-02-17 | Discharge: 2021-02-17 | Disposition: A | Discharge status: Home / Self Care | Payer: Medicare HMO | Source: Ambulatory Visit | Attending: Radiation Oncology | Admitting: Radiation Oncology

## 2021-02-17 DIAGNOSIS — C7931 Secondary malignant neoplasm of brain: Secondary | ICD-10-CM | POA: Diagnosis not present

## 2021-02-17 DIAGNOSIS — C3490 Malignant neoplasm of unspecified part of unspecified bronchus or lung: Secondary | ICD-10-CM | POA: Diagnosis not present

## 2021-02-17 DIAGNOSIS — C3492 Malignant neoplasm of unspecified part of left bronchus or lung: Secondary | ICD-10-CM | POA: Diagnosis not present

## 2021-02-17 DIAGNOSIS — Z510 Encounter for antineoplastic radiation therapy: Secondary | ICD-10-CM | POA: Diagnosis not present

## 2021-02-18 ENCOUNTER — Ambulatory Visit
Admission: RE | Admit: 2021-02-18 | Discharge: 2021-02-18 | Disposition: A | Discharge status: Home / Self Care | Payer: Medicare HMO | Source: Ambulatory Visit | Attending: Radiation Oncology | Admitting: Radiation Oncology

## 2021-02-18 DIAGNOSIS — C3490 Malignant neoplasm of unspecified part of unspecified bronchus or lung: Secondary | ICD-10-CM | POA: Diagnosis not present

## 2021-02-18 DIAGNOSIS — C7931 Secondary malignant neoplasm of brain: Secondary | ICD-10-CM | POA: Diagnosis not present

## 2021-02-18 DIAGNOSIS — C3492 Malignant neoplasm of unspecified part of left bronchus or lung: Secondary | ICD-10-CM | POA: Diagnosis not present

## 2021-02-18 DIAGNOSIS — Z510 Encounter for antineoplastic radiation therapy: Secondary | ICD-10-CM | POA: Diagnosis not present

## 2021-02-19 ENCOUNTER — Ambulatory Visit
Admission: RE | Admit: 2021-02-19 | Discharge: 2021-02-19 | Disposition: A | Discharge status: Home / Self Care | Payer: Medicare HMO | Source: Ambulatory Visit | Attending: Radiation Oncology | Admitting: Radiation Oncology

## 2021-02-19 DIAGNOSIS — C3492 Malignant neoplasm of unspecified part of left bronchus or lung: Secondary | ICD-10-CM | POA: Diagnosis not present

## 2021-02-19 DIAGNOSIS — C3490 Malignant neoplasm of unspecified part of unspecified bronchus or lung: Secondary | ICD-10-CM | POA: Diagnosis not present

## 2021-02-19 DIAGNOSIS — C7931 Secondary malignant neoplasm of brain: Secondary | ICD-10-CM | POA: Diagnosis not present

## 2021-02-19 DIAGNOSIS — Z510 Encounter for antineoplastic radiation therapy: Secondary | ICD-10-CM | POA: Diagnosis not present

## 2021-02-22 ENCOUNTER — Ambulatory Visit
Admission: RE | Admit: 2021-02-22 | Discharge: 2021-02-22 | Disposition: A | Discharge status: Home / Self Care | Payer: Medicare HMO | Source: Ambulatory Visit | Attending: Radiation Oncology | Admitting: Radiation Oncology

## 2021-02-22 DIAGNOSIS — Z0100 Encounter for examination of eyes and vision without abnormal findings: Secondary | ICD-10-CM | POA: Diagnosis not present

## 2021-02-22 DIAGNOSIS — C3492 Malignant neoplasm of unspecified part of left bronchus or lung: Secondary | ICD-10-CM | POA: Diagnosis not present

## 2021-02-22 DIAGNOSIS — C3490 Malignant neoplasm of unspecified part of unspecified bronchus or lung: Secondary | ICD-10-CM | POA: Diagnosis not present

## 2021-02-22 DIAGNOSIS — Z510 Encounter for antineoplastic radiation therapy: Secondary | ICD-10-CM | POA: Diagnosis not present

## 2021-02-22 DIAGNOSIS — C7931 Secondary malignant neoplasm of brain: Secondary | ICD-10-CM | POA: Diagnosis not present

## 2021-02-23 ENCOUNTER — Ambulatory Visit
Admission: RE | Admit: 2021-02-23 | Discharge: 2021-02-23 | Disposition: A | Discharge status: Home / Self Care | Payer: Medicare HMO | Source: Ambulatory Visit | Attending: Radiation Oncology | Admitting: Radiation Oncology

## 2021-02-23 DIAGNOSIS — C3492 Malignant neoplasm of unspecified part of left bronchus or lung: Secondary | ICD-10-CM | POA: Diagnosis not present

## 2021-02-23 DIAGNOSIS — Z510 Encounter for antineoplastic radiation therapy: Secondary | ICD-10-CM | POA: Diagnosis not present

## 2021-02-23 DIAGNOSIS — C7931 Secondary malignant neoplasm of brain: Secondary | ICD-10-CM | POA: Diagnosis not present

## 2021-02-23 DIAGNOSIS — C3490 Malignant neoplasm of unspecified part of unspecified bronchus or lung: Secondary | ICD-10-CM | POA: Diagnosis not present

## 2021-02-24 ENCOUNTER — Inpatient Hospital Stay: Payer: Medicare HMO | Attending: Hospice and Palliative Medicine | Admitting: Hospice and Palliative Medicine

## 2021-02-24 ENCOUNTER — Ambulatory Visit
Admission: RE | Admit: 2021-02-24 | Discharge: 2021-02-24 | Disposition: A | Discharge status: Home / Self Care | Payer: Medicare HMO | Source: Ambulatory Visit | Attending: Radiation Oncology | Admitting: Radiation Oncology

## 2021-02-24 DIAGNOSIS — Z5112 Encounter for antineoplastic immunotherapy: Secondary | ICD-10-CM | POA: Insufficient documentation

## 2021-02-24 DIAGNOSIS — C3490 Malignant neoplasm of unspecified part of unspecified bronchus or lung: Secondary | ICD-10-CM

## 2021-02-24 DIAGNOSIS — Z8249 Family history of ischemic heart disease and other diseases of the circulatory system: Secondary | ICD-10-CM | POA: Insufficient documentation

## 2021-02-24 DIAGNOSIS — Z510 Encounter for antineoplastic radiation therapy: Secondary | ICD-10-CM | POA: Diagnosis not present

## 2021-02-24 DIAGNOSIS — C3412 Malignant neoplasm of upper lobe, left bronchus or lung: Secondary | ICD-10-CM | POA: Insufficient documentation

## 2021-02-24 DIAGNOSIS — Z8349 Family history of other endocrine, nutritional and metabolic diseases: Secondary | ICD-10-CM | POA: Insufficient documentation

## 2021-02-24 DIAGNOSIS — Z79899 Other long term (current) drug therapy: Secondary | ICD-10-CM | POA: Insufficient documentation

## 2021-02-24 DIAGNOSIS — C787 Secondary malignant neoplasm of liver and intrahepatic bile duct: Secondary | ICD-10-CM | POA: Insufficient documentation

## 2021-02-24 DIAGNOSIS — R5383 Other fatigue: Secondary | ICD-10-CM | POA: Insufficient documentation

## 2021-02-24 DIAGNOSIS — E049 Nontoxic goiter, unspecified: Secondary | ICD-10-CM | POA: Insufficient documentation

## 2021-02-24 DIAGNOSIS — C7931 Secondary malignant neoplasm of brain: Secondary | ICD-10-CM | POA: Insufficient documentation

## 2021-02-24 DIAGNOSIS — Z803 Family history of malignant neoplasm of breast: Secondary | ICD-10-CM | POA: Insufficient documentation

## 2021-02-24 DIAGNOSIS — C3492 Malignant neoplasm of unspecified part of left bronchus or lung: Secondary | ICD-10-CM | POA: Diagnosis not present

## 2021-02-24 DIAGNOSIS — R634 Abnormal weight loss: Secondary | ICD-10-CM | POA: Insufficient documentation

## 2021-02-24 DIAGNOSIS — E876 Hypokalemia: Secondary | ICD-10-CM | POA: Insufficient documentation

## 2021-02-24 NOTE — Progress Notes (Signed)
I was unable to reach patient for scheduled MyChart visit.  Voicemail left.  Will reschedule

## 2021-02-25 ENCOUNTER — Other Ambulatory Visit: Payer: Self-pay | Admitting: *Deleted

## 2021-02-25 MED ORDER — OXYCODONE HCL 10 MG PO TABS: 10.0000 mg | ORAL_TABLET | Freq: Four times a day (QID) | ORAL | 0 refills | Status: DC | PRN

## 2021-02-25 NOTE — Telephone Encounter (Signed)
Pt requesting refill of oxycodone.

## 2021-03-02 ENCOUNTER — Inpatient Hospital Stay: Payer: Medicare HMO

## 2021-03-02 ENCOUNTER — Inpatient Hospital Stay (HOSPITAL_BASED_OUTPATIENT_CLINIC_OR_DEPARTMENT_OTHER): Payer: Medicare HMO | Admitting: Oncology

## 2021-03-02 VITALS — BP 118/73 | HR 72 | Temp 97.2°F | Wt 119.0 lb

## 2021-03-02 DIAGNOSIS — E876 Hypokalemia: Secondary | ICD-10-CM

## 2021-03-02 DIAGNOSIS — C7931 Secondary malignant neoplasm of brain: Secondary | ICD-10-CM | POA: Diagnosis not present

## 2021-03-02 DIAGNOSIS — R930 Abnormal findings on diagnostic imaging of skull and head, not elsewhere classified: Secondary | ICD-10-CM

## 2021-03-02 DIAGNOSIS — C3490 Malignant neoplasm of unspecified part of unspecified bronchus or lung: Secondary | ICD-10-CM

## 2021-03-02 DIAGNOSIS — R5383 Other fatigue: Secondary | ICD-10-CM | POA: Diagnosis not present

## 2021-03-02 DIAGNOSIS — E049 Nontoxic goiter, unspecified: Secondary | ICD-10-CM | POA: Diagnosis not present

## 2021-03-02 DIAGNOSIS — Z5112 Encounter for antineoplastic immunotherapy: Secondary | ICD-10-CM

## 2021-03-02 DIAGNOSIS — C3412 Malignant neoplasm of upper lobe, left bronchus or lung: Secondary | ICD-10-CM | POA: Diagnosis not present

## 2021-03-02 DIAGNOSIS — Z803 Family history of malignant neoplasm of breast: Secondary | ICD-10-CM | POA: Diagnosis not present

## 2021-03-02 DIAGNOSIS — Z79899 Other long term (current) drug therapy: Secondary | ICD-10-CM | POA: Diagnosis not present

## 2021-03-02 DIAGNOSIS — Z8249 Family history of ischemic heart disease and other diseases of the circulatory system: Secondary | ICD-10-CM | POA: Diagnosis not present

## 2021-03-02 DIAGNOSIS — C787 Secondary malignant neoplasm of liver and intrahepatic bile duct: Secondary | ICD-10-CM | POA: Diagnosis not present

## 2021-03-02 DIAGNOSIS — Z8349 Family history of other endocrine, nutritional and metabolic diseases: Secondary | ICD-10-CM | POA: Diagnosis not present

## 2021-03-02 DIAGNOSIS — R634 Abnormal weight loss: Secondary | ICD-10-CM | POA: Diagnosis not present

## 2021-03-02 LAB — CBC WITH DIFFERENTIAL/PLATELET
Abs Immature Granulocytes: 0 10*3/uL (ref 0.00–0.07)
Basophils Absolute: 0 10*3/uL (ref 0.0–0.1)
Basophils Relative: 0 %
Eosinophils Absolute: 0 10*3/uL (ref 0.0–0.5)
Eosinophils Relative: 0 %
HCT: 31.4 % — ABNORMAL LOW (ref 36.0–46.0)
Hemoglobin: 10.8 g/dL — ABNORMAL LOW (ref 12.0–15.0)
Immature Granulocytes: 0 %
Lymphocytes Relative: 11 %
Lymphs Abs: 0.3 10*3/uL — ABNORMAL LOW (ref 0.7–4.0)
MCH: 32.9 pg (ref 26.0–34.0)
MCHC: 34.4 g/dL (ref 30.0–36.0)
MCV: 95.7 fL (ref 80.0–100.0)
Monocytes Absolute: 0.5 10*3/uL (ref 0.1–1.0)
Monocytes Relative: 21 %
Neutro Abs: 1.6 10*3/uL — ABNORMAL LOW (ref 1.7–7.7)
Neutrophils Relative %: 68 %
Platelets: 296 10*3/uL (ref 150–400)
RBC: 3.28 MIL/uL — ABNORMAL LOW (ref 3.87–5.11)
RDW: 17.2 % — ABNORMAL HIGH (ref 11.5–15.5)
WBC: 2.4 10*3/uL — ABNORMAL LOW (ref 4.0–10.5)
nRBC: 0 % (ref 0.0–0.2)

## 2021-03-02 LAB — COMPREHENSIVE METABOLIC PANEL
ALT: 8 U/L (ref 0–44)
AST: 15 U/L (ref 15–41)
Albumin: 3.9 g/dL (ref 3.5–5.0)
Alkaline Phosphatase: 39 U/L (ref 38–126)
Anion gap: 7 (ref 5–15)
BUN: 10 mg/dL (ref 8–23)
CO2: 27 mmol/L (ref 22–32)
Calcium: 9.7 mg/dL (ref 8.9–10.3)
Chloride: 104 mmol/L (ref 98–111)
Creatinine, Ser: 0.56 mg/dL (ref 0.44–1.00)
GFR, Estimated: >60 mL/min (ref >60)
Glucose, Bld: 102 mg/dL — ABNORMAL HIGH (ref 70–99)
Potassium: 3.2 mmol/L — ABNORMAL LOW (ref 3.5–5.1)
Sodium: 138 mmol/L (ref 135–145)
Total Bilirubin: 0.7 mg/dL (ref 0.3–1.2)
Total Protein: 6.8 g/dL (ref 6.5–8.1)

## 2021-03-02 LAB — TSH: TSH: 0.861 u[IU]/mL (ref 0.350–4.500)

## 2021-03-02 MED ORDER — HEPARIN SOD (PORK) LOCK FLUSH 100 UNIT/ML IV SOLN
INTRAVENOUS | Status: AC
  Filled 2021-03-02: qty 5

## 2021-03-02 MED ORDER — SODIUM CHLORIDE 0.9 % IV SOLN
1200.0000 mg | Freq: Once | INTRAVENOUS | Status: AC
  Administered 2021-03-02: 1200 mg via INTRAVENOUS
  Filled 2021-03-02: qty 20

## 2021-03-02 MED ORDER — POTASSIUM CHLORIDE IN NACL 20-0.9 MEQ/L-% IV SOLN
Freq: Once | INTRAVENOUS | Status: AC
  Filled 2021-03-02: qty 1000

## 2021-03-02 MED ORDER — SODIUM CHLORIDE 0.9 % IV SOLN
Freq: Once | INTRAVENOUS | Status: AC
  Filled 2021-03-02: qty 250

## 2021-03-02 MED ORDER — SODIUM CHLORIDE 0.9% FLUSH
10.0000 mL | Freq: Once | INTRAVENOUS | Status: AC
  Administered 2021-03-02: 10 mL via INTRAVENOUS
  Filled 2021-03-02: qty 10

## 2021-03-02 MED ORDER — HEPARIN SOD (PORK) LOCK FLUSH 100 UNIT/ML IV SOLN
500.0000 [IU] | Freq: Once | INTRAVENOUS | Status: AC | PRN
  Administered 2021-03-02: 500 [IU]
  Filled 2021-03-02: qty 5

## 2021-03-02 MED ORDER — SODIUM CHLORIDE 0.9% FLUSH
10.0000 mL | INTRAVENOUS | Status: DC | PRN
  Administered 2021-03-02: 10 mL
  Filled 2021-03-02: qty 10

## 2021-03-02 NOTE — Progress Notes (Signed)
Hematology/Oncology Consult note [facility]  [phone] [phone]  Patient Care Team: Leonel Ramsay, MD as PCP - General (Infectious Diseases) Telford Nab, RN as Oncology Nurse Navigator   Name of the patient: Doris Johnson  [id number]  1951/03/19   Date of visit: 03/02/21  Diagnosis-extensive stage small cell lung cancer with liver and brain metastases  Chief complaint/ Reason for visit-on treatment assessment prior to cycle 1 of maintenance Tecentriq  Heme/Onc history: Patient is a 70 year old female with a remote history of smoking in her teenage years and exposure to passive smoking. She has been having ongoing midsternal pain which has been growing since the last 6 to 7 months. She had been to urgent care as well in the past. She finally had a CT chest with contrast done in October 2021 which showed a large mass in the left side of the mediastinum measuring 6.7 x 6.2 x 6.1 cm causing severe compression of the left main pulmonary artery and around the aortic arch in the left subclavian artery. Enlarged thyroid gland. Left upper lobe nodule 1 x 0.7 cm. She then underwent bronchoscopy with Dr.Aleskerov left upper lobe biopsy was nondiagnostic. However station 4, 7 and 10 L lymph nodes were consistent with metastatic small cell carcinoma.  PET CT scan showed enlarged level 3 cervical lymph node 2.2 cm that was hypermetabolic with an SUV of 9.7. Large central 7.3 cm AP window mass. 5.7 cm left liver mass. MRI brain with and without contrast showed 2 small foci of enhancement in the right cerebellar hemisphere and right temporal lobe without mass-effect or edema as well concerning for brain metastases    Interval history-patient has lost 5 pounds in the last 3 weeks and is continuing to lose weight which is concerning.  Reports that she has an appetite but food does not taste good.  She is drinking about 1 Ensure per day  at most.  ECOG PS- 1 Pain scale- 0 Opioid associated constipation- no  Review of systems- Review of Systems  Constitutional: Positive for malaise/fatigue and weight loss. Negative for chills and fever.       Lack of taste sensation  HENT: Negative for congestion, ear discharge and nosebleeds.   Eyes: Negative for blurred vision.  Respiratory: Negative for cough, hemoptysis, sputum production, shortness of breath and wheezing.   Cardiovascular: Negative for chest pain, palpitations, orthopnea and claudication.  Gastrointestinal: Negative for abdominal pain, blood in stool, constipation, diarrhea, heartburn, melena, nausea and vomiting.  Genitourinary: Negative for dysuria, flank pain, frequency, hematuria and urgency.  Musculoskeletal: Negative for back pain, joint pain and myalgias.  Skin: Negative for rash.  Neurological: Negative for dizziness, tingling, focal weakness, seizures, weakness and headaches.  Endo/Heme/Allergies: Does not bruise/bleed easily.  Psychiatric/Behavioral: Negative for depression and suicidal ideas. The patient does not have insomnia.       No Known Allergies   Past Medical History:  Diagnosis Date  . Cancer ([place])   . Cataract   . Diabetes mellitus without complication ([place])   . Hyperlipidemia   . Hypertension   . Hypothyroidism    doctor's keeping an eye on thyroid      Past Surgical History:  Procedure Laterality Date  . ABDOMINAL HYSTERECTOMY    . PORTA CATH INSERTION N/A 11/30/2020   Procedure: PORTA CATH INSERTION;  Surgeon: Algernon Huxley, MD;  Location: [place] [facility];  Service: Cardiovascular;  Laterality: N/A;  . VIDEO BRONCHOSCOPY WITH ENDOBRONCHIAL NAVIGATION N/A  10/21/2020   Procedure: VIDEO BRONCHOSCOPY WITH ENDOBRONCHIAL NAVIGATION;  Surgeon: Ottie Glazier, MD;  Location: [facility];  Service: Thoracic;  Laterality: N/A;  . VIDEO BRONCHOSCOPY WITH ENDOBRONCHIAL ULTRASOUND N/A 10/21/2020   Procedure: VIDEO BRONCHOSCOPY WITH  ENDOBRONCHIAL ULTRASOUND;  Surgeon: Ottie Glazier, MD;  Location: [facility];  Service: Thoracic;  Laterality: N/A;    Social History   Socioeconomic History  . Marital status: Single    Spouse name: Not on file  . Number of children: Not on file  . Years of education: Not on file  . Highest education level: Not on file  Occupational History  . Not on file  Tobacco Use  . Smoking status: Never Smoker  . Smokeless tobacco: Never Used  Vaping Use  . Vaping Use: Never used  Substance and Sexual Activity  . Alcohol use: No  . Drug use: No  . Sexual activity: Not on file  Other Topics Concern  . Not on file  Social History Narrative  . Not on file   Social Determinants of Health   Financial Resource Strain: Not on file  Food Insecurity: Not on file  Transportation Needs: Not on file  Physical Activity: Not on file  Stress: Not on file  Social Connections: Not on file  Intimate Partner Violence: Not on file    Family History  Problem Relation Age of Onset  . Cancer Mother        breast  . Hypertension Mother   . Breast cancer Mother 33  . Heart disease Father   . Hyperlipidemia Brother   . Heart disease Brother      Current Outpatient Medications:  .  docusate sodium (COLACE) 100 MG capsule, Take 200 mg by mouth at bedtime., Disp: , Rfl:  .  fluticasone (FLONASE) 50 MCG/ACT nasal spray, Place 2 sprays into both nostrils daily. (Patient taking differently: Place 2 sprays into both nostrils daily as needed for allergies.), Disp: 16 g, Rfl: 5 .  lidocaine-prilocaine (EMLA) cream, Apply 1 application topically as needed. Apply small amount to port site at least 1 hour prior to it being accessed, cover with plastic wrap, Disp: 30 g, Rfl: 1 .  lisinopril (PRINIVIL,ZESTRIL) 20 MG tablet, TAKE 1 TABLET BY MOUTH EVERY DAY (Patient taking differently: Take 20 mg by mouth every evening.), Disp: 30 tablet, Rfl: 0 .  LORazepam (ATIVAN) 0.5 MG tablet, Take 1 tablet (0.5 mg total)  by mouth every 6 (six) hours as needed (Nausea or vomiting). (Patient not taking: Reported on 02/02/2021), Disp: 30 tablet, Rfl: 0 .  metFORMIN (GLUCOPHAGE) 500 MG tablet, Take 1,000 mg by mouth 2 (two) times daily with a meal. , Disp: , Rfl:  .  ondansetron (ZOFRAN) 8 MG tablet, Take 1 tablet (8 mg total) by mouth 2 (two) times daily as needed for refractory nausea / vomiting. Start on day 3 after carboplatin chemo. (Patient not taking: No sig reported), Disp: 30 tablet, Rfl: 1 .  oxyCODONE (OXYCONTIN) 10 mg 12 hr tablet, Take 1 tablet (10 mg total) by mouth every 12 (twelve) hours., Disp: 60 tablet, Rfl: 0 .  Oxycodone HCl 10 MG TABS, Take 1 tablet (10 mg total) by mouth every 6 (six) hours as needed (pain)., Disp: 60 tablet, Rfl: 0 .  potassium chloride SA (KLOR-CON) 20 MEQ tablet, Take 2 tablets (40 mEq total) by mouth daily. (Patient not taking: No sig reported), Disp: 7 tablet, Rfl: 0 .  prochlorperazine (COMPAZINE) 10 MG tablet, Take 1 tablet (10 mg total)  by mouth every 6 (six) hours as needed (Nausea or vomiting). (Patient not taking: No sig reported), Disp: 30 tablet, Rfl: 1 .  rosuvastatin (CRESTOR) 10 MG tablet, Take 10 mg by mouth every evening. , Disp: , Rfl:  .  sennosides-docusate sodium (SENOKOT-S) 8.6-50 MG tablet, Take 1 tablet by mouth daily., Disp: , Rfl:  .  triamterene-hydrochlorothiazide (DYAZIDE) 37.5-25 MG capsule, TAKE ONE CAPSULE BY MOUTH DAILY (Patient taking differently: Take 1 capsule by mouth daily.), Disp: 30 capsule, Rfl: 0 No current facility-administered medications for this visit.  Facility-Administered Medications Ordered in Other Visits:  .  sodium chloride flush (NS) 0.9 % injection 10 mL, 10 mL, Intravenous, PRN, Sindy Guadeloupe, MD, 10 mL at 12/15/20 0850  Physical exam:  Vitals:   03/02/21 0941  BP: 118/73  Pulse: 72  Temp: (!) 97.2 F (36.2 C)  TempSrc: Tympanic  SpO2: 100%  Weight: 119 lb (54 kg)   Physical Exam Constitutional:      General: She  is not in acute distress.    Comments: Patient appears thin and cachectic  Cardiovascular:     Rate and Rhythm: Normal rate and regular rhythm.     Heart sounds: Normal heart sounds.  Pulmonary:     Effort: Pulmonary effort is normal.     Breath sounds: Normal breath sounds.  Abdominal:     General: Bowel sounds are normal.     Palpations: Abdomen is soft.  Skin:    General: Skin is warm and dry.  Neurological:     Mental Status: She is alert and oriented to person, place, and time.      CMP Latest Ref Rng & Units 03/02/2021  Glucose 70 - 99 mg/dL 102(H)  BUN 8 - 23 mg/dL 10  Creatinine 0.44 - 1.00 mg/dL 0.56  Sodium 135 - 145 mmol/L 138  Potassium 3.5 - 5.1 mmol/L 3.2(L)  Chloride 98 - 111 mmol/L 104  CO2 22 - 32 mmol/L 27  Calcium 8.9 - 10.3 mg/dL 9.7  Total Protein 6.5 - 8.1 g/dL 6.8  Total Bilirubin 0.3 - 1.2 mg/dL 0.7  Alkaline Phos 38 - 126 U/L 39  AST 15 - 41 U/L 15  ALT 0 - 44 U/L 8   CBC Latest Ref Rng & Units 03/02/2021  WBC 4.0 - 10.5 K/uL 2.4(L)  Hemoglobin 12.0 - 15.0 g/dL 10.8(L)  Hematocrit 36.0 - 46.0 % 31.4(L)  Platelets 150 - 400 K/uL 296      Assessment and plan- Patient is a 70 y.o. female with extensive stage small cell lung cancer with liver metastases s/p 4 cycles of carbo etoposide Tecentriq chemotherapy.  She is here for on treatment assessment prior to cycle 1 of maintenance Tecentriq  Counts okay to proceed with cycle 1 of maintenance Tecentriq today.  Patient is already scheduled for CT chest abdomen and pelvis with contrast on 03/16/2021.  I will also obtain an MRI brain With and without contrast at this time.  Her prior MRI brain in December 2021 did show small foci of enhancement in the right cerebellar hemisphere without any mass-effect or edema.  I will refer her to radiation oncology again following the MRI for consideration of whole brain radiation.  I will tentatively see her back in 3 weeks for cycle 2 of maintenance Tecentriq  chemotherapy  Patient's pathology was also reviewed.  It was reported as hypocellular specimen consistent with neuroendocrine neoplasm on the FNA channel lymph node specimen.  Small cell carcinoma cannot be excluded  but findings were not definitively diagnostic.  I did touch base with [facility] pathology Dr. Dessie Coma as well.  They have taken a real look at the  slides and feel like small cell carcinoma is still the diagnosis.  Based on CT scan results if there is concern for progression I will consider rebiopsy as she has not responded well to [place] etoposide chemotherapy  Weight loss: Likely due to underlying malignancy.  I have encouraged her to increase her Ensure intake to 2-3 times a day if possible.  I will refer her to nutrition counseling at next visit if she continues to lose weight   Visit Diagnosis 1. Encounter for antineoplastic immunotherapy   2. Small cell lung cancer ([place])   3. Abnormal MRI of head   4. Hypokalemia      Dr. Randa Evens, MD, MPH [facility] [phone] 03/02/2021 8:47 AM

## 2021-03-03 ENCOUNTER — Encounter: Payer: Self-pay | Admitting: Oncology

## 2021-03-11 ENCOUNTER — Other Ambulatory Visit: Payer: Self-pay | Admitting: *Deleted

## 2021-03-11 MED ORDER — OXYCODONE HCL ER 10 MG PO T12A: 10.0000 mg | EXTENDED_RELEASE_TABLET | Freq: Two times a day (BID) | ORAL | 0 refills | Status: DC

## 2021-03-11 MED ORDER — OXYCODONE HCL 10 MG PO TABS: 10.0000 mg | ORAL_TABLET | Freq: Four times a day (QID) | ORAL | 0 refills | Status: DC | PRN

## 2021-03-15 ENCOUNTER — Other Ambulatory Visit: Payer: Self-pay | Admitting: *Deleted

## 2021-03-15 MED ORDER — MORPHINE SULFATE ER 15 MG PO TBCR: 15.0000 mg | EXTENDED_RELEASE_TABLET | Freq: Two times a day (BID) | ORAL | 0 refills | Status: DC

## 2021-03-15 NOTE — Telephone Encounter (Signed)
CALLED TO let pt know that the oxycontin was not covered with her insurance and I called to see what can be covered and we will send ms contin in for her and she is agreeable

## 2021-03-16 ENCOUNTER — Other Ambulatory Visit: Payer: Self-pay

## 2021-03-16 ENCOUNTER — Ambulatory Visit
Admission: RE | Admit: 2021-03-16 | Discharge: 2021-03-16 | Disposition: A | Discharge status: Home / Self Care | Payer: Medicare HMO | Source: Ambulatory Visit | Attending: Oncology | Admitting: Oncology

## 2021-03-16 DIAGNOSIS — C3490 Malignant neoplasm of unspecified part of unspecified bronchus or lung: Secondary | ICD-10-CM | POA: Diagnosis not present

## 2021-03-16 DIAGNOSIS — C787 Secondary malignant neoplasm of liver and intrahepatic bile duct: Secondary | ICD-10-CM | POA: Diagnosis not present

## 2021-03-16 MED ORDER — IOHEXOL 300 MG/ML  SOLN
80.0000 mL | Freq: Once | INTRAMUSCULAR | Status: AC | PRN
  Administered 2021-03-16: 80 mL via INTRAVENOUS

## 2021-03-17 ENCOUNTER — Telehealth: Payer: Self-pay | Admitting: *Deleted

## 2021-03-17 NOTE — Telephone Encounter (Signed)
Pt called in to report is experiencing numbness to the top of her right foot. No swelling and/or redness is noted. States that this has happened in the past and resolved over a few days. Instructed pt to continue to monitor at this time and notify us with any new or worsening symptoms. Pt verbalized understanding.

## 2021-03-22 ENCOUNTER — Ambulatory Visit
Admission: RE | Admit: 2021-03-22 | Discharge: 2021-03-22 | Disposition: A | Discharge status: Home / Self Care | Payer: Medicare HMO | Source: Ambulatory Visit | Attending: Oncology | Admitting: Oncology

## 2021-03-22 ENCOUNTER — Other Ambulatory Visit: Payer: Self-pay

## 2021-03-22 DIAGNOSIS — R930 Abnormal findings on diagnostic imaging of skull and head, not elsewhere classified: Secondary | ICD-10-CM | POA: Insufficient documentation

## 2021-03-22 DIAGNOSIS — C3490 Malignant neoplasm of unspecified part of unspecified bronchus or lung: Secondary | ICD-10-CM | POA: Diagnosis not present

## 2021-03-22 DIAGNOSIS — Z85118 Personal history of other malignant neoplasm of bronchus and lung: Secondary | ICD-10-CM | POA: Diagnosis not present

## 2021-03-22 DIAGNOSIS — G9389 Other specified disorders of brain: Secondary | ICD-10-CM | POA: Diagnosis not present

## 2021-03-22 MED ORDER — GADOBUTROL 1 MMOL/ML IV SOLN
6.0000 mL | Freq: Once | INTRAVENOUS | Status: AC | PRN
  Administered 2021-03-22: 6 mL via INTRAVENOUS

## 2021-03-23 ENCOUNTER — Inpatient Hospital Stay: Payer: Medicare HMO

## 2021-03-23 ENCOUNTER — Other Ambulatory Visit: Payer: Self-pay | Admitting: *Deleted

## 2021-03-23 ENCOUNTER — Inpatient Hospital Stay (HOSPITAL_BASED_OUTPATIENT_CLINIC_OR_DEPARTMENT_OTHER): Payer: Medicare HMO | Admitting: Oncology

## 2021-03-23 ENCOUNTER — Inpatient Hospital Stay: Payer: Medicare HMO | Attending: Oncology

## 2021-03-23 ENCOUNTER — Encounter: Payer: Self-pay | Admitting: Oncology

## 2021-03-23 VITALS — BP 116/67 | HR 69 | Temp 97.3°F | Resp 18 | Wt 124.5 lb

## 2021-03-23 DIAGNOSIS — J90 Pleural effusion, not elsewhere classified: Secondary | ICD-10-CM | POA: Insufficient documentation

## 2021-03-23 DIAGNOSIS — C3490 Malignant neoplasm of unspecified part of unspecified bronchus or lung: Secondary | ICD-10-CM

## 2021-03-23 DIAGNOSIS — R5383 Other fatigue: Secondary | ICD-10-CM | POA: Insufficient documentation

## 2021-03-23 DIAGNOSIS — R222 Localized swelling, mass and lump, trunk: Secondary | ICD-10-CM | POA: Diagnosis not present

## 2021-03-23 DIAGNOSIS — G893 Neoplasm related pain (acute) (chronic): Secondary | ICD-10-CM | POA: Insufficient documentation

## 2021-03-23 DIAGNOSIS — C7931 Secondary malignant neoplasm of brain: Secondary | ICD-10-CM | POA: Diagnosis not present

## 2021-03-23 DIAGNOSIS — Z8349 Family history of other endocrine, nutritional and metabolic diseases: Secondary | ICD-10-CM | POA: Insufficient documentation

## 2021-03-23 DIAGNOSIS — K769 Liver disease, unspecified: Secondary | ICD-10-CM

## 2021-03-23 DIAGNOSIS — Z803 Family history of malignant neoplasm of breast: Secondary | ICD-10-CM | POA: Insufficient documentation

## 2021-03-23 DIAGNOSIS — C3412 Malignant neoplasm of upper lobe, left bronchus or lung: Secondary | ICD-10-CM | POA: Diagnosis not present

## 2021-03-23 DIAGNOSIS — R079 Chest pain, unspecified: Secondary | ICD-10-CM | POA: Diagnosis not present

## 2021-03-23 DIAGNOSIS — Z5112 Encounter for antineoplastic immunotherapy: Secondary | ICD-10-CM | POA: Insufficient documentation

## 2021-03-23 DIAGNOSIS — G479 Sleep disorder, unspecified: Secondary | ICD-10-CM | POA: Insufficient documentation

## 2021-03-23 DIAGNOSIS — R160 Hepatomegaly, not elsewhere classified: Secondary | ICD-10-CM | POA: Insufficient documentation

## 2021-03-23 DIAGNOSIS — T402X5A Adverse effect of other opioids, initial encounter: Secondary | ICD-10-CM | POA: Diagnosis not present

## 2021-03-23 DIAGNOSIS — E049 Nontoxic goiter, unspecified: Secondary | ICD-10-CM | POA: Insufficient documentation

## 2021-03-23 DIAGNOSIS — K5903 Drug induced constipation: Secondary | ICD-10-CM | POA: Diagnosis not present

## 2021-03-23 DIAGNOSIS — Z95828 Presence of other vascular implants and grafts: Secondary | ICD-10-CM

## 2021-03-23 DIAGNOSIS — R112 Nausea with vomiting, unspecified: Secondary | ICD-10-CM | POA: Insufficient documentation

## 2021-03-23 DIAGNOSIS — M2550 Pain in unspecified joint: Secondary | ICD-10-CM | POA: Diagnosis not present

## 2021-03-23 DIAGNOSIS — Z79899 Other long term (current) drug therapy: Secondary | ICD-10-CM | POA: Diagnosis not present

## 2021-03-23 DIAGNOSIS — Z8249 Family history of ischemic heart disease and other diseases of the circulatory system: Secondary | ICD-10-CM | POA: Insufficient documentation

## 2021-03-23 DIAGNOSIS — M549 Dorsalgia, unspecified: Secondary | ICD-10-CM | POA: Diagnosis not present

## 2021-03-23 DIAGNOSIS — Z923 Personal history of irradiation: Secondary | ICD-10-CM | POA: Insufficient documentation

## 2021-03-23 DIAGNOSIS — E042 Nontoxic multinodular goiter: Secondary | ICD-10-CM | POA: Diagnosis not present

## 2021-03-23 DIAGNOSIS — C787 Secondary malignant neoplasm of liver and intrahepatic bile duct: Secondary | ICD-10-CM | POA: Diagnosis not present

## 2021-03-23 LAB — CBC WITH DIFFERENTIAL/PLATELET
Abs Immature Granulocytes: 0 10*3/uL (ref 0.00–0.07)
Basophils Absolute: 0 10*3/uL (ref 0.0–0.1)
Basophils Relative: 0 %
Eosinophils Absolute: 0 10*3/uL (ref 0.0–0.5)
Eosinophils Relative: 2 %
HCT: 31.6 % — ABNORMAL LOW (ref 36.0–46.0)
Hemoglobin: 10.3 g/dL — ABNORMAL LOW (ref 12.0–15.0)
Immature Granulocytes: 0 %
Lymphocytes Relative: 14 %
Lymphs Abs: 0.3 10*3/uL — ABNORMAL LOW (ref 0.7–4.0)
MCH: 31.1 pg (ref 26.0–34.0)
MCHC: 32.6 g/dL (ref 30.0–36.0)
MCV: 95.5 fL (ref 80.0–100.0)
Monocytes Absolute: 0.3 10*3/uL (ref 0.1–1.0)
Monocytes Relative: 11 %
Neutro Abs: 1.8 10*3/uL (ref 1.7–7.7)
Neutrophils Relative %: 73 %
Platelets: 226 10*3/uL (ref 150–400)
RBC: 3.31 MIL/uL — ABNORMAL LOW (ref 3.87–5.11)
RDW: 14.6 % (ref 11.5–15.5)
WBC: 2.5 10*3/uL — ABNORMAL LOW (ref 4.0–10.5)
nRBC: 0 % (ref 0.0–0.2)

## 2021-03-23 LAB — COMPREHENSIVE METABOLIC PANEL
ALT: 9 U/L (ref 0–44)
AST: 15 U/L (ref 15–41)
Albumin: 3.6 g/dL (ref 3.5–5.0)
Alkaline Phosphatase: 39 U/L (ref 38–126)
Anion gap: 7 (ref 5–15)
BUN: 8 mg/dL (ref 8–23)
CO2: 25 mmol/L (ref 22–32)
Calcium: 9.4 mg/dL (ref 8.9–10.3)
Chloride: 104 mmol/L (ref 98–111)
Creatinine, Ser: 0.48 mg/dL (ref 0.44–1.00)
GFR, Estimated: >60 mL/min (ref >60)
Glucose, Bld: 132 mg/dL — ABNORMAL HIGH (ref 70–99)
Potassium: 3.8 mmol/L (ref 3.5–5.1)
Sodium: 136 mmol/L (ref 135–145)
Total Bilirubin: 0.7 mg/dL (ref 0.3–1.2)
Total Protein: 6.2 g/dL — ABNORMAL LOW (ref 6.5–8.1)

## 2021-03-23 MED ORDER — SODIUM CHLORIDE 0.9 % IV SOLN
1200.0000 mg | Freq: Once | INTRAVENOUS | Status: AC
  Administered 2021-03-23: 1200 mg via INTRAVENOUS
  Filled 2021-03-23: qty 20

## 2021-03-23 MED ORDER — HEPARIN SOD (PORK) LOCK FLUSH 100 UNIT/ML IV SOLN
500.0000 [IU] | Freq: Once | INTRAVENOUS | Status: AC
Start: 2021-03-23 — End: 2021-03-23
  Administered 2021-03-23: 500 [IU] via INTRAVENOUS
  Filled 2021-03-23: qty 5

## 2021-03-23 MED ORDER — HEPARIN SOD (PORK) LOCK FLUSH 100 UNIT/ML IV SOLN
INTRAVENOUS | Status: AC
  Filled 2021-03-23: qty 5

## 2021-03-23 MED ORDER — HEPARIN SOD (PORK) LOCK FLUSH 100 UNIT/ML IV SOLN
500.0000 [IU] | Freq: Once | INTRAVENOUS | Status: DC | PRN
  Filled 2021-03-23: qty 5

## 2021-03-23 MED ORDER — SODIUM CHLORIDE 0.9 % IV SOLN
Freq: Once | INTRAVENOUS | Status: AC
  Filled 2021-03-23: qty 250

## 2021-03-23 MED ORDER — SODIUM CHLORIDE 0.9% FLUSH
10.0000 mL | Freq: Once | INTRAVENOUS | Status: AC
  Administered 2021-03-23: 10 mL via INTRAVENOUS
  Filled 2021-03-23: qty 10

## 2021-03-23 MED ORDER — OXYCODONE HCL 15 MG PO TABS: 15.0000 mg | ORAL_TABLET | ORAL | 0 refills | Status: DC | PRN

## 2021-03-23 NOTE — Addendum Note (Signed)
Addended by: Luella Cook on: 03/23/2021 04:07 PM   Modules accepted: Orders

## 2021-03-23 NOTE — Progress Notes (Signed)
Pt in for follow up, reports some pain in chest/lung area, ongoing.  Pt reports appetite some improved. Weight up 5 lbs since visit in April.

## 2021-03-23 NOTE — Patient Instructions (Signed)
[facility]  Discharge Instructions: Thank you for choosing [facility] to provide your oncology and hematology care.  If you have a lab appointment with the [facility], please go directly to the [facility] and check in at the registration area.  Wear comfortable clothing and clothing appropriate for easy access to any Portacath or PICC line.   We strive to give you quality time with your provider. You may need to reschedule your appointment if you arrive late (15 or more minutes).  Arriving late affects you and other patients whose appointments are after yours.  Also, if you miss three or more appointments without notifying the office, you may be dismissed from the clinic at the provider's discretion.      For prescription refill requests, have your pharmacy contact our office and allow 72 hours for refills to be completed.    Today you received the following chemotherapy and/or immunotherapy agents Tecentriq      To help prevent nausea and vomiting after your treatment, we encourage you to take your nausea medication as directed.  BELOW ARE SYMPTOMS THAT SHOULD BE REPORTED IMMEDIATELY: . *FEVER GREATER THAN 100.4 F (38 C) OR HIGHER . *CHILLS OR SWEATING . *NAUSEA AND VOMITING THAT IS NOT CONTROLLED WITH YOUR NAUSEA MEDICATION . *UNUSUAL SHORTNESS OF BREATH . *UNUSUAL BRUISING OR BLEEDING . *URINARY PROBLEMS (pain or burning when urinating, or frequent urination) . *BOWEL PROBLEMS (unusual diarrhea, constipation, pain near the anus) . TENDERNESS IN MOUTH AND THROAT WITH OR WITHOUT PRESENCE OF ULCERS (sore throat, sores in mouth, or a toothache) . UNUSUAL RASH, SWELLING OR PAIN  . UNUSUAL VAGINAL DISCHARGE OR ITCHING   Items with * indicate a potential emergency and should be followed up as soon as possible or go to the Emergency Department if any problems should occur.  Please show the CHEMOTHERAPY ALERT CARD or IMMUNOTHERAPY  ALERT CARD at check-in to the Emergency Department and triage nurse.  Should you have questions after your visit or need to cancel or reschedule your appointment, please contact [facility]  [phone] and follow the prompts.  Office hours are 8:00 a.m. to 4:30 p.m. Monday - Friday. Please note that voicemails left after 4:00 p.m. may not be returned until the following business day.  We are closed weekends and major holidays. You have access to a nurse at all times for urgent questions. Please call the main number to the clinic [phone] and follow the prompts.  For any non-urgent questions, you may also contact your provider using MyChart. We now offer e-Visits for anyone 32 and older to request care online for non-urgent symptoms. For details visit [email or web address].   Also download the MyChart app! Go to the app store, search "MyChart", open the app, select [facility], and log in with your MyChart username and password.  Due to Covid, a mask is required upon entering the hospital/clinic. If you do not have a mask, one will be given to you upon arrival. For doctor visits, patients may have 1 support person aged 22 or older with them. For treatment visits, patients cannot have anyone with them due to current Covid guidelines and our immunocompromised population. Atezolizumab injection What is this medicine? ATEZOLIZUMAB (a te zoe LIZ ue mab) is a monoclonal antibody. It is used to treat bladder cancer (urothelial cancer), liver cancer, lung cancer, and melanoma. This medicine may be used for other purposes; ask your health care provider or pharmacist  if you have questions. COMMON BRAND NAME(S): Tecentriq What should I tell my health care provider before I take this medicine? They need to know if you have any of these conditions:  autoimmune diseases like Crohn's disease, ulcerative colitis, or lupus  have had or planning to have an allogeneic stem  cell transplant (uses someone else's stem cells)  history of organ transplant  history of radiation to the chest  nervous system problems like myasthenia gravis or Guillain-Barre syndrome  an unusual or allergic reaction to atezolizumab, other medicines, foods, dyes, or preservatives  pregnant or trying to get pregnant  breast-feeding How should I use this medicine? This medicine is for infusion into a vein. It is given by a health care professional in a hospital or clinic setting. A special MedGuide will be given to you before each treatment. Be sure to read this information carefully each time. Talk to your pediatrician regarding the use of this medicine in children. Special care may be needed. Overdosage: If you think you have taken too much of this medicine contact a poison control center or emergency room at once. NOTE: This medicine is only for you. Do not share this medicine with others. What if I miss a dose? It is important not to miss your dose. Call your doctor or health care professional if you are unable to keep an appointment. What may interact with this medicine? Interactions have not been studied. This list may not describe all possible interactions. Give your health care provider a list of all the medicines, herbs, non-prescription drugs, or dietary supplements you use. Also tell them if you smoke, drink alcohol, or use illegal drugs. Some items may interact with your medicine. What should I watch for while using this medicine? Your condition will be monitored carefully while you are receiving this medicine. You may need blood work done while you are taking this medicine. Do not become pregnant while taking this medicine or for at least 5 months after stopping it. Women should inform their doctor if they wish to become pregnant or think they might be pregnant. There is a potential for serious side effects to an unborn child. Talk to your health care professional or  pharmacist for more information. Do not breast-feed an infant while taking this medicine or for at least 5 months after the last dose. What side effects may I notice from receiving this medicine? Side effects that you should report to your doctor or health care professional as soon as possible:  allergic reactions like skin rash, itching or hives, swelling of the face, lips, or tongue  black, tarry stools  bloody or watery diarrhea  breathing problems  changes in vision  chest pain or chest tightness  chills  facial flushing  fever  headache  signs and symptoms of high blood sugar such as dizziness; dry mouth; dry skin; fruity breath; nausea; stomach pain; increased hunger or thirst; increased urination  signs and symptoms of liver injury like dark yellow or brown urine; general ill feeling or flu-like symptoms; light-colored stools; loss of appetite; nausea; right upper belly pain; unusually weak or tired; yellowing of the eyes or skin  stomach pain  trouble passing urine or change in the amount of urine Side effects that usually do not require medical attention (report to your doctor or health care professional if they continue or are bothersome):  bone pain  cough  diarrhea  joint pain  muscle pain  muscle weakness  swelling of arms or legs  tiredness  weight loss This list may not describe all possible side effects. Call your doctor for medical advice about side effects. You may report side effects to FDA at 1-800-FDA-1088. Where should I keep my medicine? This drug is given in a hospital or clinic and will not be stored at home. NOTE: This sheet is a summary. It may not cover all possible information. If you have questions about this medicine, talk to your doctor, pharmacist, or health care provider.  2021 Elsevier/Gold Standard (2020-07-30 13:59:34)

## 2021-03-23 NOTE — Progress Notes (Addendum)
Hematology/Oncology Consult note [facility]  Telephone:([phone] [phone]  Patient Care Team: Leonel Ramsay, MD as PCP - General (Infectious Diseases) Telford Nab, RN as Oncology Nurse Navigator   Name of the patient: Doris Johnson  [id number]  1951-05-05   Date of visit: 03/23/21  Diagnosis- extensive stage small cell lung cancer with liver and brain metastases   Chief complaint/ Reason for visit-on treatment assessment prior to cycle 2 of maintenance Tecentriq  Heme/Onc history: Patient is a 70 year old female with a remote history of smoking in her teenage years and exposure to passive smoking. She has been having ongoing midsternal pain which has been growing since the last 6 to 7 months. She had been to urgent care as well in the past. She finally had a CT chest with contrast done in October 2021 which showed a large mass in the left side of the mediastinum measuring 6.7 x 6.2 x 6.1 cm causing severe compression of the left main pulmonary artery and around the aortic arch in the left subclavian artery. Enlarged thyroid gland. Left upper lobe nodule 1 x 0.7 cm. She then underwent bronchoscopy with Dr.Aleskerov left upper lobe biopsy was nondiagnostic. However station 4, 7 and 10 L lymph nodes were consistent with metastatic small cell carcinoma.  PET CT scan showed enlarged level 3 cervical lymph node 2.2 cm that was hypermetabolic with an SUV of 9.7. Large central 7.3 cm AP window mass. 5.7 cm left liver mass. MRI brain with and without contrast showed 2 small foci of enhancement in the right cerebellar hemisphere and right temporal lobe without mass-effect or edema as well concerning for brain metastases  Patient had 2 cycles of carbo etoposide chemotherapy along with Tecentriq and subsequent scan showed no significant improvement in the primary lung mass or liver mass.  She received radiation treatment to her primary lung  mass and also seen by [facility] for second opinion.  Her pathology was reviewed at [facility] and reported as possible neuroendocrine neoplasm But stated that small cell carcinoma cannot be excluded but not definitely diagnostic for it.  However [facility] pathology feels that this is consistent with small cell lung cancer.  She has completed 4 cycles of carbo etoposide Tecentriq chemotherapy and is currently on maintenance Tecentriq  Interval history-patient reports that her appetite is slowly coming back and she has gained some weight as compared to her last visit.  She still reports midsternal chest pain that she had at the time of diagnosis which comes and goes.  She is on morphine long-acting as well as as needed oxycodone but feels like the dose could be increased  ECOG PS- 1 Pain scale- 3 Opioid associated constipation- no  Review of systems- Review of Systems  Constitutional: Positive for malaise/fatigue. Negative for chills, fever and weight loss.  HENT: Negative for congestion, ear discharge and nosebleeds.   Eyes: Negative for blurred vision.  Respiratory: Negative for cough, hemoptysis, sputum production, shortness of breath and wheezing.        Midsternal pain  Cardiovascular: Negative for chest pain, palpitations, orthopnea and claudication.  Gastrointestinal: Negative for abdominal pain, blood in stool, constipation, diarrhea, heartburn, melena, nausea and vomiting.  Genitourinary: Negative for dysuria, flank pain, frequency, hematuria and urgency.  Musculoskeletal: Negative for back pain, joint pain and myalgias.  Skin: Negative for rash.  Neurological: Negative for dizziness, tingling, focal weakness, seizures, weakness and headaches.  Endo/Heme/Allergies: Does not bruise/bleed easily.  Psychiatric/Behavioral: Negative for depression and suicidal  ideas. The patient does not have insomnia.       No Known Allergies   Past Medical History:  Diagnosis Date  . Cancer ([place])   . Cataract   .  Diabetes mellitus without complication ([place])   . Hyperlipidemia   . Hypertension   . Hypothyroidism    doctor's keeping an eye on thyroid      Past Surgical History:  Procedure Laterality Date  . ABDOMINAL HYSTERECTOMY    . PORTA CATH INSERTION N/A 11/30/2020   Procedure: PORTA CATH INSERTION;  Surgeon: Algernon Huxley, MD;  Location: [place] [facility];  Service: Cardiovascular;  Laterality: N/A;  . VIDEO BRONCHOSCOPY WITH ENDOBRONCHIAL NAVIGATION N/A 10/21/2020   Procedure: VIDEO BRONCHOSCOPY WITH ENDOBRONCHIAL NAVIGATION;  Surgeon: Ottie Glazier, MD;  Location: [facility] OR[facility];  Service: Thoracic;  Laterality: N/A;  . VIDEO BRONCHOSCOPY WITH ENDOBRONCHIAL ULTRASOUND N/A 10/21/2020   Procedure: VIDEO BRONCHOSCOPY WITH ENDOBRONCHIAL ULTRASOUND;  Surgeon: Ottie Glazier, MD;  Location: [facility] OR[facility];  Service: Thoracic;  Laterality: N/A;    Social History   Socioeconomic History  . Marital status: Single    Spouse name: Not on file  . Number of children: Not on file  . Years of education: Not on file  . Highest education level: Not on file  Occupational History  . Not on file  Tobacco Use  . Smoking status: Never Smoker  . Smokeless tobacco: Never Used  Vaping Use  . Vaping Use: Never used  Substance and Sexual Activity  . Alcohol use: No  . Drug use: No  . Sexual activity: Not on file  Other Topics Concern  . Not on file  Social History Narrative  . Not on file   Social Determinants of Health   Financial Resource Strain: Not on file  Food Insecurity: Not on file  Transportation Needs: Not on file  Physical Activity: Not on file  Stress: Not on file  Social Connections: Not on file  Intimate Partner Violence: Not on file    Family History  Problem Relation Age of Onset  . Cancer Mother        breast  . Hypertension Mother   . Breast cancer Mother 93  . Heart disease Father   . Hyperlipidemia Brother   . Heart disease Brother      Current Outpatient Medications:   .  docusate sodium (COLACE) 100 MG capsule, Take 200 mg by mouth at bedtime., Disp: , Rfl:  .  fluticasone (FLONASE) 50 MCG/ACT nasal spray, Place 2 sprays into both nostrils daily., Disp: 16 g, Rfl: 5 .  lidocaine-prilocaine (EMLA) cream, Apply 1 application topically as needed. Apply small amount to port site at least 1 hour prior to it being accessed, cover with plastic wrap, Disp: 30 g, Rfl: 1 .  morphine (MS CONTIN) 15 MG 12 hr tablet, Take 1 tablet (15 mg total) by mouth every 12 (twelve) hours., Disp: 60 tablet, Rfl: 0 .  potassium chloride SA (KLOR-CON) 20 MEQ tablet, Take 2 tablets (40 mEq total) by mouth daily., Disp: 7 tablet, Rfl: 0 .  sennosides-docusate sodium (SENOKOT-S) 8.6-50 MG tablet, Take 1 tablet by mouth daily., Disp: , Rfl:  .  lisinopril (PRINIVIL,ZESTRIL) 20 MG tablet, TAKE 1 TABLET BY MOUTH EVERY DAY (Patient not taking: No sig reported), Disp: 30 tablet, Rfl: 0 .  LORazepam (ATIVAN) 0.5 MG tablet, Take 1 tablet (0.5 mg total) by mouth every 6 (six) hours as needed (Nausea or vomiting). (Patient not taking: No sig reported),  Disp: 30 tablet, Rfl: 0 .  metFORMIN (GLUCOPHAGE) 500 MG tablet, Take 1,000 mg by mouth 2 (two) times daily with a meal.  (Patient not taking: No sig reported), Disp: , Rfl:  .  ondansetron (ZOFRAN) 8 MG tablet, Take 1 tablet (8 mg total) by mouth 2 (two) times daily as needed for refractory nausea / vomiting. Start on day 3 after carboplatin chemo. (Patient not taking: No sig reported), Disp: 30 tablet, Rfl: 1 .  oxyCODONE (ROXICODONE) 15 MG immediate release tablet, Take 1 tablet (15 mg total) by mouth every 4 (four) hours as needed for pain., Disp: 180 tablet, Rfl: 0 .  prochlorperazine (COMPAZINE) 10 MG tablet, Take 1 tablet (10 mg total) by mouth every 6 (six) hours as needed (Nausea or vomiting). (Patient not taking: No sig reported), Disp: 30 tablet, Rfl: 1 .  rosuvastatin (CRESTOR) 10 MG tablet, Take 10 mg by mouth every evening.  (Patient not  taking: No sig reported), Disp: , Rfl:  .  triamterene-hydrochlorothiazide (DYAZIDE) 37.5-25 MG capsule, TAKE ONE CAPSULE BY MOUTH DAILY (Patient not taking: No sig reported), Disp: 30 capsule, Rfl: 0 No current facility-administered medications for this visit.  Facility-Administered Medications Ordered in Other Visits:  .  heparin lock flush 100 unit/mL, 500 Units, Intracatheter, Once PRN, Sindy Guadeloupe, MD .  sodium chloride flush (NS) 0.9 % injection 10 mL, 10 mL, Intravenous, PRN, Sindy Guadeloupe, MD, 10 mL at 12/15/20 0850  Physical exam:  Vitals:   03/23/21 1007  BP: 116/67  Pulse: 69  Resp: 18  Temp: (!) 97.3 F (36.3 C)  TempSrc: Tympanic  SpO2: 100%  Weight: 124 lb 8 oz (56.5 kg)   Physical Exam Constitutional:      General: She is not in acute distress. Cardiovascular:     Rate and Rhythm: Normal rate and regular rhythm.     Heart sounds: Normal heart sounds.  Pulmonary:     Effort: Pulmonary effort is normal.     Breath sounds: Normal breath sounds.  Abdominal:     General: Bowel sounds are normal.     Palpations: Abdomen is soft.  Skin:    General: Skin is warm and dry.  Neurological:     Mental Status: She is alert and oriented to person, place, and time.      CMP Latest Ref Rng & Units 03/23/2021  Glucose 70 - 99 mg/dL 132(H)  BUN 8 - 23 mg/dL 8  Creatinine 0.44 - 1.00 mg/dL 0.48  Sodium 135 - 145 mmol/L 136  Potassium 3.5 - 5.1 mmol/L 3.8  Chloride 98 - 111 mmol/L 104  CO2 22 - 32 mmol/L 25  Calcium 8.9 - 10.3 mg/dL 9.4  Total Protein 6.5 - 8.1 g/dL 6.2(L)  Total Bilirubin 0.3 - 1.2 mg/dL 0.7  Alkaline Phos 38 - 126 U/L 39  AST 15 - 41 U/L 15  ALT 0 - 44 U/L 9   CBC Latest Ref Rng & Units 03/23/2021  WBC 4.0 - 10.5 K/uL 2.5(L)  Hemoglobin 12.0 - 15.0 g/dL 10.3(L)  Hematocrit 36.0 - 46.0 % 31.6(L)  Platelets 150 - 400 K/uL 226    No images are attached to the encounter.  MR Brain W Wo Contrast  Result Date: 03/22/2021 CLINICAL DATA:  History  of lung cancer. Possible cerebellar metastases. EXAM: MRI HEAD WITHOUT AND WITH CONTRAST TECHNIQUE: Multiplanar, multiecho pulse sequences of the brain and surrounding structures were obtained without and with intravenous contrast. CONTRAST:  30mL GADAVIST GADOBUTROL 1  MMOL/ML IV SOLN COMPARISON:  11/09/2020 FINDINGS: Brain: The 2 hyperintense foci seen in the right cerebellum and right posterolateral temporal lobe are intrinsically T1 hyperintense and unchanged. No associated mass effect or edema. No associated susceptibility artifact on gradient imaging. No acute or subacute infarct, hemorrhage, hydrocephalus, or atrophy Vascular: Normal flow voids and vascular enhancements Skull and upper cervical spine: Normal marrow signal Sinuses/Orbits: Bilateral cataract resection.  No pathologic finding IMPRESSION: Two small nodules in the right cerebellum and right temporal lobe are unchanged from December 2021. These are intrinsically T1 hyperintense and not certainly malignant. Recommend continued imaging surveillance. Electronically Signed   By: Monte Fantasia M.D.   On: 03/22/2021 19:07   CT CHEST ABDOMEN PELVIS W CONTRAST  Result Date: 03/16/2021 CLINICAL DATA:  Follow-up small cell lung carcinoma.  Chest pain. EXAM: CT CHEST, ABDOMEN, AND PELVIS WITH CONTRAST TECHNIQUE: Multidetector CT imaging of the chest, abdomen and pelvis was performed following the standard protocol during bolus administration of intravenous contrast. CONTRAST:  59mL OMNIPAQUE IOHEXOL 300 MG/ML  SOLN COMPARISON:  01/05/2021 FINDINGS: CT CHEST FINDINGS Cardiovascular: No acute findings. Mediastinum/Lymph Nodes: Multinodular goiter with substernal extension remains stable. A heterogeneously enhancing mediastinal mass is again seen which is centered in the AP window, and encases the aortic arch and left pulmonary artery and extends superiorly into the lateral aortic region. This measures 6.9 x 6.6 cm, without significant change compared to  prior study. No new or enlarging sites of lymphadenopathy identified. Lungs/Pleura: Band like opacity again seen in the anterior left upper lobe, consistent with atelectasis. Multiple new ill-defined nodular opacities are seen in the lingula, with differential diagnosis including infection, drug reaction, or less likely metastatic disease. 6 mm nodule in the left lung apex remains stable. New small left pleural effusion also seen. Musculoskeletal:  No suspicious bone lesions identified. CT ABDOMEN AND PELVIS FINDINGS Hepatobiliary: Low-attenuation mass in segment 4B left lobe currently measures 5.8 x 5.7 cm, slightly increased in size since previous study. No other liver masses are identified. Gallbladder is unremarkable. No evidence of biliary ductal dilatation. Pancreas:  No mass or inflammatory changes. Spleen:  Within normal limits in size and appearance. Adrenals/Urinary tract:  No masses or hydronephrosis. Stomach/Bowel: No evidence of obstruction, inflammatory process, or abnormal fluid collections. Vascular/Lymphatic: No pathologically enlarged lymph nodes identified. No acute vascular findings. Reproductive: Prior hysterectomy noted. Adnexal regions are unremarkable in appearance. Other:  Increased diffuse body wall edema noted. Musculoskeletal:  No suspicious bone lesions identified. IMPRESSION: No significant change in large mediastinal mass. Slight increase in size of left hepatic lobe metastasis. New ill-defined nodular opacities in lingula, with differential diagnosis including infection, drug reaction, or less likely metastatic disease. New small pleural effusion. Stable multinodular goiter with substernal extension. Electronically Signed   By: Marlaine Hind M.D.   On: 03/16/2021 13:55     Assessment and plan- Patient is a 70 y.o. female  with extensive stage small cell lung cancer with liver metastases s/p 4 cycles of carbo etoposide Tecentriq chemotherapy.   She is here for on treatment  assessment prior to cycle 2 of maintenance Tecentriq  I have reviewed CT chest abdomen and pelvis images independently and discussed findings with the patient.  Size of her primary lung mass remains stable at around 6.9 cm.  There has been a slow increase in the size of her liver mass however which currently measures 5.8 x 6.7 cm but reported to be increased in size as compared to prior.  Overall she has  not had a good response to [place] etoposide Tecentriq chemotherapy.  Although the diagnosis was questionably put as a neuroendocrine neoplasm at [facility] when it was sent for second opinion pathology here at [facility] was still convinced that this was suggestive of small cell lung cancer.  I will proceed with a CT or ultrasound-guided liver biopsy to verify the diagnosis given the lack of treatment response.  She will proceed with Tecentriq today and see Dr. Rogue Bussing in 3 weeks for cycle 3 of Tecentriq and discuss results of liver biopsy.  Neoplasm related pain: Continue morphine 15 mg twice daily but increase the dose of oxycodone to 15 mg every 4 as needed.  Oxycodone prescription refill today  Possible brain metastases:Patient was noted to have 2 foci of enhancement in the right cerebellar hemisphere and right temporal lobe back in December 2021 plan at that time was to continue chemoimmunotherapy with repeat MRI in 3 months with consideration for whole brain radiation.  Presently MRI shows no change in these lesions And given the T1 hyperintensity they have not deemed to be definitive for malignancy.  I have discussed these findings with Dr. Donella Stade as well who recommends continued surveillance of these lesions without proceeding with whole brain radiation treatment at this time  Patient and her daughter comprehend my plan well   Visit Diagnosis 1. Encounter for antineoplastic immunotherapy   2. Small cell lung cancer ([place])   3. Neoplasm related pain      Dr. Randa Evens, MD, MPH [facility] [phone] 03/23/2021 3:08 PM

## 2021-03-24 ENCOUNTER — Inpatient Hospital Stay (HOSPITAL_BASED_OUTPATIENT_CLINIC_OR_DEPARTMENT_OTHER): Payer: Medicare HMO | Admitting: Hospice and Palliative Medicine

## 2021-03-24 ENCOUNTER — Telehealth: Payer: Self-pay | Admitting: *Deleted

## 2021-03-24 DIAGNOSIS — Z515 Encounter for palliative care: Secondary | ICD-10-CM

## 2021-03-24 DIAGNOSIS — C3490 Malignant neoplasm of unspecified part of unspecified bronchus or lung: Secondary | ICD-10-CM

## 2021-03-24 NOTE — Progress Notes (Signed)
Virtual Visit via Telephone Note  I connected with [name] on 03/24/21 at 10:45 AM EDT by telephone and verified that I am speaking with the correct person using two identifiers.  Location: Patient: home Provider: home   I discussed the limitations, risks, security and privacy concerns of performing an evaluation and management service by telephone and the availability of in person appointments. I also discussed with the patient that there may be a patient responsible charge related to this service. The patient expressed understanding and agreed to proceed.   History of Present Illness: Doris Johnson is a 70 y.o. female with multiple medical problems including extensive stage small cell lung cancer with liver and brain metastases.  Patient had multiple months of worsening midsternal pain and eventually had CT of the chest with contrast in October 2021 revealing a large mediastinal mass with severe compression of the left main pulmonary artery.  PET/CT revealed extensive disease involving the chest, neck, and liver.  Additionally, patient was found to have small brain mets on MRI.  She is on systemic chemo/immunotherapy. Patient is s/p XRT.  She is referred to palliative care to help address goals and manage ongoing symptoms.   Observations/Objective: Patient saw Dr. Janese Banks yesterday. Patient continues to endorse persistent neoplasm related pain in chest/abd. She is on MS Contin 15mg  BID and takes oxycodone 10mg  multiple times per day for BTP. Yesterday, Dr. Janese Banks increased the oxycodone to 15mg . Patient has yet to pick this up from the pharmacy.   Patient does endorse chronic constipation. She takes MiraLax on occasion but bowel movements are irregular. She could not cite a specific frequency. We discussed adjustment to her bowel regimen including adding Senna daily with a target of a BM once daily or every other day.   Patient denies other significant changes, concerns, or symptomatic  complaints. Overall, she feels she is doing reasonably well.   Assessment and Plan: Extensive stage SCLC - on systemic treatment and followed by Dr. Janese Banks. CTs 5/3 did not show siginicant change in lung mass but some slight interval increase in size of hepatic mets.   Neoplasm related pain - agree with increased oxycodone. Would increase MS Contin to Q8H if needed  Opioid induced constipation - Miralax/senna  Follow Up Instructions: Follow up My Chart visit in 1 month   I discussed the assessment and treatment plan with the patient. The patient was provided an opportunity to ask questions and all were answered. The patient agreed with the plan and demonstrated an understanding of the instructions.   The patient was advised to call back or seek an in-person evaluation if the symptoms worsen or if the condition fails to improve as anticipated.  I provided 10 minutes of non-face-to-face time during this encounter.   Irean Hong, NP

## 2021-03-24 NOTE — Telephone Encounter (Signed)
Spoke with patient and her brother, Doris Johnson, to provide clarification after MD visit yesterday. All questions answered during call. While on the phone, pt asked if Dr. Janese Banks had any recommendations to help with the sore on her bottom.   Please advise.

## 2021-03-25 ENCOUNTER — Other Ambulatory Visit: Payer: Medicare HMO

## 2021-03-25 NOTE — Progress Notes (Addendum)
Tumor Board Documentation  Doris Johnson was presented by Dr Janese Banks at our Tumor Board on 03/25/2021, which included representatives from medical oncology,radiation oncology,internal medicine,navigation,pathology,radiology,surgical,pulmonology,research,palliative care.  Doris Johnson currently presents as a current patient,for discussion,for progression with history of the following treatments: neoadjuvant chemoradiation,active survellience.  Additionally, we reviewed previous medical and familial history, history of present illness, and recent lab results along with all available histopathologic and imaging studies. The tumor board considered available treatment options and made the following recommendations: Active surveillance,Biopsy (3 Months/ CT or US Guided Biopsy)    The following procedures/referrals were also placed: No orders of the defined types were placed in this encounter.   Clinical Trial Status: not discussed   Staging used: Clinical Stage  AJCC Staging:       Group: Extensive Stage Lung cancer with Liver Mets Brain mets remain questionable   National site-specific guidelines   were discussed with respect to the case.  Tumor board is a meeting of clinicians from various specialty areas who evaluate and discuss patients for whom a multidisciplinary approach is being considered. Final determinations in the plan of care are those of the provider(s). The responsibility for follow up of recommendations given during tumor board is that of the provider.   Today's extended care, comprehensive team conference, Doris Johnson was not present for the discussion and was not examined.   Multidisciplinary Tumor Board is a multidisciplinary case peer review process.  Decisions discussed in the Multidisciplinary Tumor Board reflect the opinions of the specialists present at the conference without having examined the patient.  Ultimately, treatment and diagnostic decisions rest with the primary  provider(s) and the patient.

## 2021-03-26 ENCOUNTER — Telehealth: Payer: Self-pay | Admitting: *Deleted

## 2021-03-26 DIAGNOSIS — G4700 Insomnia, unspecified: Secondary | ICD-10-CM | POA: Diagnosis not present

## 2021-03-26 DIAGNOSIS — E119 Type 2 diabetes mellitus without complications: Secondary | ICD-10-CM | POA: Diagnosis not present

## 2021-03-26 DIAGNOSIS — R634 Abnormal weight loss: Secondary | ICD-10-CM | POA: Diagnosis not present

## 2021-03-26 DIAGNOSIS — C787 Secondary malignant neoplasm of liver and intrahepatic bile duct: Secondary | ICD-10-CM | POA: Diagnosis not present

## 2021-03-26 DIAGNOSIS — C3490 Malignant neoplasm of unspecified part of unspecified bronchus or lung: Secondary | ICD-10-CM | POA: Diagnosis not present

## 2021-03-26 DIAGNOSIS — C7931 Secondary malignant neoplasm of brain: Secondary | ICD-10-CM | POA: Diagnosis not present

## 2021-03-26 NOTE — Progress Notes (Signed)
Patient on schedule for Liver Biopsy 03/30/2021, called and spoke with patient on phone with pre procedure instructions given. Made aware to be here @ 0900, NPO after Mn prior to procedure, and driver post procedure/recovery/discharge. Stated understanding.

## 2021-03-26 NOTE — Telephone Encounter (Signed)
I had called pt on 5/12 and spoke to pt about liver bx for 5/17 and went over the instructions and also talked about sore on her bottom. It is at her crack.Per Dr. Janese Banks it is a very small spot but in a difficult area to heal. Patient was advised to use triple atb cream and put small amount on and put a small gauze on it and cover with paper tape. Also when sitting try to sit on left side of cheek and then right side of cheek several times a day to take pressure off the area. Can try donut pillow to help. If the sore gets worse to call our office. She was in agreement with the plan

## 2021-03-29 ENCOUNTER — Other Ambulatory Visit: Payer: Self-pay | Admitting: Radiology

## 2021-03-30 ENCOUNTER — Ambulatory Visit
Admission: RE | Admit: 2021-03-30 | Discharge: 2021-03-30 | Disposition: A | Discharge status: Home / Self Care | Payer: Medicare HMO | Source: Ambulatory Visit | Attending: Oncology | Admitting: Oncology

## 2021-03-30 ENCOUNTER — Other Ambulatory Visit: Payer: Self-pay

## 2021-03-30 DIAGNOSIS — C7931 Secondary malignant neoplasm of brain: Secondary | ICD-10-CM | POA: Insufficient documentation

## 2021-03-30 DIAGNOSIS — Z79899 Other long term (current) drug therapy: Secondary | ICD-10-CM | POA: Diagnosis not present

## 2021-03-30 DIAGNOSIS — Z79891 Long term (current) use of opiate analgesic: Secondary | ICD-10-CM | POA: Insufficient documentation

## 2021-03-30 DIAGNOSIS — K769 Liver disease, unspecified: Secondary | ICD-10-CM

## 2021-03-30 DIAGNOSIS — Z7984 Long term (current) use of oral hypoglycemic drugs: Secondary | ICD-10-CM | POA: Insufficient documentation

## 2021-03-30 DIAGNOSIS — C787 Secondary malignant neoplasm of liver and intrahepatic bile duct: Secondary | ICD-10-CM | POA: Diagnosis not present

## 2021-03-30 DIAGNOSIS — R160 Hepatomegaly, not elsewhere classified: Secondary | ICD-10-CM | POA: Diagnosis not present

## 2021-03-30 DIAGNOSIS — C3490 Malignant neoplasm of unspecified part of unspecified bronchus or lung: Secondary | ICD-10-CM | POA: Insufficient documentation

## 2021-03-30 DIAGNOSIS — Z803 Family history of malignant neoplasm of breast: Secondary | ICD-10-CM | POA: Diagnosis not present

## 2021-03-30 DIAGNOSIS — C7A1 Malignant poorly differentiated neuroendocrine tumors: Secondary | ICD-10-CM | POA: Diagnosis not present

## 2021-03-30 LAB — CBC
HCT: 32 % — ABNORMAL LOW (ref 36.0–46.0)
Hemoglobin: 10.7 g/dL — ABNORMAL LOW (ref 12.0–15.0)
MCH: 31.6 pg (ref 26.0–34.0)
MCHC: 33.4 g/dL (ref 30.0–36.0)
MCV: 94.4 fL (ref 80.0–100.0)
Platelets: 215 10*3/uL (ref 150–400)
RBC: 3.39 MIL/uL — ABNORMAL LOW (ref 3.87–5.11)
RDW: 14.2 % (ref 11.5–15.5)
WBC: 2.2 10*3/uL — ABNORMAL LOW (ref 4.0–10.5)
nRBC: 0 % (ref 0.0–0.2)

## 2021-03-30 LAB — PROTIME-INR
INR: 1 (ref 0.8–1.2)
Prothrombin Time: 13.2 seconds (ref 11.4–15.2)

## 2021-03-30 LAB — GLUCOSE, CAPILLARY
Glucose-Capillary: 92 mg/dL (ref 70–99)
Glucose-Capillary: 86 mg/dL (ref 70–99)

## 2021-03-30 MED ORDER — SODIUM CHLORIDE 0.9 % IV SOLN: INTRAVENOUS | Status: DC

## 2021-03-30 MED ORDER — MIDAZOLAM HCL 2 MG/2ML IJ SOLN
INTRAMUSCULAR | Status: AC | PRN
  Administered 2021-03-30: 1 mg via INTRAVENOUS

## 2021-03-30 MED ORDER — FENTANYL CITRATE (PF) 100 MCG/2ML IJ SOLN
INTRAMUSCULAR | Status: AC
  Filled 2021-03-30: qty 2

## 2021-03-30 MED ORDER — MIDAZOLAM HCL 2 MG/2ML IJ SOLN
INTRAMUSCULAR | Status: AC
  Filled 2021-03-30: qty 2

## 2021-03-30 MED ORDER — FENTANYL CITRATE (PF) 100 MCG/2ML IJ SOLN
INTRAMUSCULAR | Status: AC | PRN
  Administered 2021-03-30: 50 ug via INTRAVENOUS

## 2021-03-30 NOTE — Procedures (Signed)
Interventional Radiology Procedure Note  Procedure: Liver mass biopsy  Indication: Enlarging liver mass  Findings: Please refer to procedural dictation for full description.  Complications: None  EBL: < 10 mL  Miachel Roux, MD [phone]

## 2021-03-30 NOTE — H&P (Signed)
Chief Complaint: Evaluation of hepatic lobe metastasis. Request is for liver biopsy  Referring Physician(s): Rao,Archana C  Supervising Physician: Mir, Sharen Heck  Patient Status: [facility] - Out-pt  History of Present Illness: Doris Johnson is a 70 y.o. female History of metastatic small cell lung carcinoma ( liver and brain). CT CAP  from 5.3.22 reads Slight increase in size of left hepatic lobe metastasis. Patient's case was discussed at Tumor Board where it was recommend that the patient had a 3 month CT or US guided biopsy. Team is requesting a liver biopsy for further evaluation and possible treatment planning.   Currently without any significant complaints. Patient alert and sitting in recliner, calm and comfortable. Denies any fevers, headache, chest pain, SOB, cough, abdominal pain, nausea, vomiting or bleeding. Return precautions and treatment recommendations and follow-up discussed with the patient who is agreeable with the plan.   Past Medical History:  Diagnosis Date  . Cancer ([place])   . Cataract   . Diabetes mellitus without complication ([place])   . Hyperlipidemia   . Hypertension   . Hypothyroidism    doctor's keeping an eye on thyroid     Past Surgical History:  Procedure Laterality Date  . ABDOMINAL HYSTERECTOMY    . PORTA CATH INSERTION N/A 11/30/2020   Procedure: PORTA CATH INSERTION;  Surgeon: Algernon Huxley, MD;  Location: [facility];  Service: Cardiovascular;  Laterality: N/A;  . VIDEO BRONCHOSCOPY WITH ENDOBRONCHIAL NAVIGATION N/A 10/21/2020   Procedure: VIDEO BRONCHOSCOPY WITH ENDOBRONCHIAL NAVIGATION;  Surgeon: Ottie Glazier, MD;  Location: [facility];  Service: Thoracic;  Laterality: N/A;  . VIDEO BRONCHOSCOPY WITH ENDOBRONCHIAL ULTRASOUND N/A 10/21/2020   Procedure: VIDEO BRONCHOSCOPY WITH ENDOBRONCHIAL ULTRASOUND;  Surgeon: Ottie Glazier, MD;  Location: [facility];  Service: Thoracic;  Laterality: N/A;    Allergies: Patient has no known  allergies.  Medications: Prior to Admission medications   Medication Sig Start Date End Date Taking? Authorizing Provider  docusate sodium (COLACE) 100 MG capsule Take 200 mg by mouth at bedtime.    [provider]  fluticasone (FLONASE) 50 MCG/ACT nasal spray Place 2 sprays into both nostrils daily. 04/26/17   Mar Daring, PA-C  lidocaine-prilocaine (EMLA) cream Apply 1 application topically as needed. Apply small amount to port site at least 1 hour prior to it being accessed, cover with plastic wrap 11/12/20   Sindy Guadeloupe, MD  lisinopril (PRINIVIL,ZESTRIL) 20 MG tablet TAKE 1 TABLET BY MOUTH EVERY DAY Patient not taking: No sig reported 01/21/18   Birdie Sons, MD  LORazepam (ATIVAN) 0.5 MG tablet Take 1 tablet (0.5 mg total) by mouth every 6 (six) hours as needed (Nausea or vomiting). Patient not taking: No sig reported 11/12/20   Sindy Guadeloupe, MD  metFORMIN (GLUCOPHAGE) 500 MG tablet Take 1,000 mg by mouth 2 (two) times daily with a meal.  Patient not taking: No sig reported 04/14/15   [provider]  morphine (MS CONTIN) 15 MG 12 hr tablet Take 1 tablet (15 mg total) by mouth every 12 (twelve) hours. 03/15/21   Sindy Guadeloupe, MD  ondansetron (ZOFRAN) 8 MG tablet Take 1 tablet (8 mg total) by mouth 2 (two) times daily as needed for refractory nausea / vomiting. Start on day 3 after carboplatin chemo. Patient not taking: No sig reported 11/12/20   Sindy Guadeloupe, MD  oxyCODONE (ROXICODONE) 15 MG immediate release tablet Take 1 tablet (15 mg total) by mouth every 4 (four) hours as needed  for pain. 03/23/21   Sindy Guadeloupe, MD  potassium chloride SA (KLOR-CON) 20 MEQ tablet Take 2 tablets (40 mEq total) by mouth daily. 12/15/20   Sindy Guadeloupe, MD  prochlorperazine (COMPAZINE) 10 MG tablet Take 1 tablet (10 mg total) by mouth every 6 (six) hours as needed (Nausea or vomiting). Patient not taking: No sig reported 11/12/20   Sindy Guadeloupe, MD  rosuvastatin  (CRESTOR) 10 MG tablet Take 10 mg by mouth every evening.  Patient not taking: No sig reported 01/10/20   [provider]  sennosides-docusate sodium (SENOKOT-S) 8.6-50 MG tablet Take 1 tablet by mouth daily.    [provider]  triamterene-hydrochlorothiazide (DYAZIDE) 37.5-25 MG capsule TAKE ONE CAPSULE BY MOUTH DAILY Patient not taking: No sig reported 05/15/17   Mar Daring, PA-C     Family History  Problem Relation Age of Onset  . Cancer Mother        breast  . Hypertension Mother   . Breast cancer Mother 65  . Heart disease Father   . Hyperlipidemia Brother   . Heart disease Brother     Social History   Socioeconomic History  . Marital status: Single    Spouse name: Not on file  . Number of children: Not on file  . Years of education: Not on file  . Highest education level: Not on file  Occupational History  . Not on file  Tobacco Use  . Smoking status: Never Smoker  . Smokeless tobacco: Never Used  Vaping Use  . Vaping Use: Never used  Substance and Sexual Activity  . Alcohol use: No  . Drug use: No  . Sexual activity: Not on file  Other Topics Concern  . Not on file  Social History Narrative  . Not on file   Social Determinants of Health   Financial Resource Strain: Not on file  Food Insecurity: Not on file  Transportation Needs: Not on file  Physical Activity: Not on file  Stress: Not on file  Social Connections: Not on file    Review of Systems: A 12 point ROS discussed and pertinent positives are indicated in the HPI above.  All other systems are negative.  Review of Systems  Constitutional: Negative for fatigue and fever.  HENT: Negative for congestion.   Respiratory: Negative for cough and shortness of breath.   Gastrointestinal: Negative for abdominal pain, diarrhea, nausea and vomiting.    Vital Signs: There were no vitals taken for this visit.  Physical Exam Vitals and nursing note reviewed.  Constitutional:       Appearance: She is well-developed.  HENT:     Head: Normocephalic and atraumatic.  Eyes:     Conjunctiva/sclera: Conjunctivae normal.  Cardiovascular:     Rate and Rhythm: Normal rate and regular rhythm.     Heart sounds: Normal heart sounds.  Pulmonary:     Effort: Pulmonary effort is normal.     Comments: Diminished left apex. Musculoskeletal:        General: Normal range of motion.     Cervical back: Normal range of motion.  Skin:    General: Skin is warm.  Neurological:     Mental Status: She is alert and oriented to person, place, and time.     Imaging: MR Brain W Wo Contrast  Result Date: 03/22/2021 CLINICAL DATA:  History of lung cancer. Possible cerebellar metastases. EXAM: MRI HEAD WITHOUT AND WITH CONTRAST TECHNIQUE: Multiplanar, multiecho pulse sequences of the brain and  surrounding structures were obtained without and with intravenous contrast. CONTRAST:  21mL GADAVIST GADOBUTROL 1 MMOL/ML IV SOLN COMPARISON:  11/09/2020 FINDINGS: Brain: The 2 hyperintense foci seen in the right cerebellum and right posterolateral temporal lobe are intrinsically T1 hyperintense and unchanged. No associated mass effect or edema. No associated susceptibility artifact on gradient imaging. No acute or subacute infarct, hemorrhage, hydrocephalus, or atrophy Vascular: Normal flow voids and vascular enhancements Skull and upper cervical spine: Normal marrow signal Sinuses/Orbits: Bilateral cataract resection.  No pathologic finding IMPRESSION: Two small nodules in the right cerebellum and right temporal lobe are unchanged from December 2021. These are intrinsically T1 hyperintense and not certainly malignant. Recommend continued imaging surveillance. Electronically Signed   By: Monte Fantasia M.D.   On: 03/22/2021 19:07   CT CHEST ABDOMEN PELVIS W CONTRAST  Result Date: 03/16/2021 CLINICAL DATA:  Follow-up small cell lung carcinoma.  Chest pain. EXAM: CT CHEST, ABDOMEN, AND PELVIS WITH CONTRAST  TECHNIQUE: Multidetector CT imaging of the chest, abdomen and pelvis was performed following the standard protocol during bolus administration of intravenous contrast. CONTRAST:  54mL OMNIPAQUE IOHEXOL 300 MG/ML  SOLN COMPARISON:  01/05/2021 FINDINGS: CT CHEST FINDINGS Cardiovascular: No acute findings. Mediastinum/Lymph Nodes: Multinodular goiter with substernal extension remains stable. A heterogeneously enhancing mediastinal mass is again seen which is centered in the AP window, and encases the aortic arch and left pulmonary artery and extends superiorly into the lateral aortic region. This measures 6.9 x 6.6 cm, without significant change compared to prior study. No new or enlarging sites of lymphadenopathy identified. Lungs/Pleura: Band like opacity again seen in the anterior left upper lobe, consistent with atelectasis. Multiple new ill-defined nodular opacities are seen in the lingula, with differential diagnosis including infection, drug reaction, or less likely metastatic disease. 6 mm nodule in the left lung apex remains stable. New small left pleural effusion also seen. Musculoskeletal:  No suspicious bone lesions identified. CT ABDOMEN AND PELVIS FINDINGS Hepatobiliary: Low-attenuation mass in segment 4B left lobe currently measures 5.8 x 5.7 cm, slightly increased in size since previous study. No other liver masses are identified. Gallbladder is unremarkable. No evidence of biliary ductal dilatation. Pancreas:  No mass or inflammatory changes. Spleen:  Within normal limits in size and appearance. Adrenals/Urinary tract:  No masses or hydronephrosis. Stomach/Bowel: No evidence of obstruction, inflammatory process, or abnormal fluid collections. Vascular/Lymphatic: No pathologically enlarged lymph nodes identified. No acute vascular findings. Reproductive: Prior hysterectomy noted. Adnexal regions are unremarkable in appearance. Other:  Increased diffuse body wall edema noted. Musculoskeletal:  No  suspicious bone lesions identified. IMPRESSION: No significant change in large mediastinal mass. Slight increase in size of left hepatic lobe metastasis. New ill-defined nodular opacities in lingula, with differential diagnosis including infection, drug reaction, or less likely metastatic disease. New small pleural effusion. Stable multinodular goiter with substernal extension. Electronically Signed   By: Marlaine Hind M.D.   On: 03/16/2021 13:55    Labs:  CBC: Recent Labs    01/12/21 0821 02/02/21 0808 03/02/21 0834 03/23/21 0940  WBC 4.8 4.1 2.4* 2.5*  HGB 10.6* 10.9* 10.8* 10.3*  HCT 32.0* 32.4* 31.4* 31.6*  PLT 317 314 296 226    COAGS: Recent Labs    09/29/20 1036  INR 1.0  APTT 27    BMP: Recent Labs    01/12/21 0821 02/02/21 0808 03/02/21 0834 03/23/21 0940  NA 138 135 138 136  K 3.9 3.4* 3.2* 3.8  CL 105 103 104 104  CO2 23 24 27  25  GLUCOSE 100* 124* 102* 132*  BUN 10 13 10 8   CALCIUM 9.5 9.7 9.7 9.4  CREATININE 0.52 0.48 0.56 0.48  GFRNONAA >60 >60 >60 >60    LIVER FUNCTION TESTS: Recent Labs    01/12/21 0821 02/02/21 0808 03/02/21 0834 03/23/21 0940  BILITOT 0.5 0.7 0.7 0.7  AST 14* 17 15 15   ALT 10 10 8 9   ALKPHOS 53 48 39 39  PROT 6.7 6.8 6.8 6.2*  ALBUMIN 4.0 4.0 3.9 3.6    Assessment and Plan:  70 y.o. female outpatient. History of metastatic small cell lung carcinoma ( liver and brain). CT CAP  from 5.3.22 reads Slight increase in size of left hepatic lobe metastasis. Patient's case was discussed at Tumor Board where it was recommend that the patient had a 3 month CT or US guided biopsy.  Team is requesting a liver biopsy for further evaluation and possible treatment planning.   All labs and medications are with acceptable parameters. NKDA. Patient has been NPO since midnight.   Risks and benefits of liver biopsy was discussed with the patient and/or patient's family including, but not limited to bleeding, infection, damage to adjacent  structures or low yield requiring additional tests.  All of the questions were answered and there is agreement to proceed.  Consent signed and in chart.   Thank you for this interesting consult.  I greatly enjoyed meeting PAMELYN BANCROFT and look forward to participating in their care.  A copy of this report was sent to the requesting provider on this date.  Electronically Signed: Jacqualine Mau, NP 03/30/2021, 9:09 AM   I spent a total of  30 Minutes   in face to face in clinical consultation, greater than 50% of which was counseling/coordinating care for liver biopsy

## 2021-03-30 NOTE — Discharge Instructions (Signed)
Lung Cancer Lung cancer is an abnormal growth of cancerous cells that forms a mass (malignant tumor) in a lung. There are several types of lung cancer. The types are based on the appearance of the tumor cells. The two most common types are: Non-small cell lung cancer. This type of lung cancer is the most common type. Non-small cell lung cancers include squamous cell carcinoma, adenocarcinoma, and large cell carcinoma. Small cell lung cancer. In this type of lung cancer, abnormal cells are smaller than those of non-small cell lung cancer. Small cell lung cancer gets worse (progresses) faster than non-small cell lung cancer. What are the causes? The most common cause of lung cancer is smoking tobacco. The second most common cause is exposure to a chemical called radon. What increases the risk? You are more likely to develop this condition if: You smoke tobacco. You have been exposed to: Secondhand tobacco smoke. Radon gas. Uranium. Asbestos. Arsenic in drinking water. Air pollution. You have a family or personal history of lung cancer. You have had lung radiation therapy in the past. You are older than age 71. What are the signs or symptoms? In the early stages, you may not have any symptoms. As the cancer progresses, symptoms may include: A lasting cough, possibly with blood. Fatigue. Unexplained weight loss. Shortness of breath. Loud breathing (wheezing). Chest pain. Loss of appetite. Symptoms of advanced lung cancer include: Hoarseness. Bone or joint pain. Weakness. Change in the structure of the fingernails (clubbing), so that the nail looks like an upside-down spoon. Swelling of the face or arms. Inability to move the face (paralysis). Drooping eyelids. How is this diagnosed? This condition may be diagnosed based on: Your symptoms and medical history. A physical exam. A chest X-ray. A CT scan. Blood tests. Sputum tests. Removal of a sample of lung tissue (lung  biopsy) for testing. Your cancer will be assessed (staged) to determine how severe it is and how much it has spread (metastasized). How is this treated? Treatment depends on the type and stage of your cancer. Treatment may include one or more of the following: Surgery to remove as much of the cancer as possible. Lymph nodes in the area may be removed and tested for cancer as well. Medicines that kill cancer cells (chemotherapy). High-energy rays that kill cancer cells (radiation therapy). Targeted therapy. This targets specific parts of cancer cells and the area around them to block the growth and spread of the cancer. Targeted therapy can help limit the damage to healthy cells. Follow these instructions at home: Eating and drinking Some of your treatments might affect your appetite. If you are having problems eating, or if you do not have an appetite, meet with a dietitian. If you have side effects that affect your appetite, it may help to: Eat smaller meals and snacks often. Drink high-nutrition and high-calorie shakes or supplements. Eat bland and soft foods that are easy to eat. Avoid eating foods that are hot, spicy, or hard to swallow. General instructions Do not use any products that contain nicotine or tobacco, such as cigarettes and e-cigarettes. If you need help quitting, ask your health care provider. Do not drink alcohol. If you are admitted to the hospital, make sure your cancer specialist (oncologist) is aware. Your cancer may affect your treatment for other conditions. Take over-the-counter and prescription medicines only as told by your health care provider. Consider joining a support group for people who have been diagnosed with lung cancer. Work with your health care  provider to manage any side effects of treatment. Keep all follow-up visits as told by your health care provider. This is important.   Where to find more information American Cancer Society:  https://www.cancer.[place]): https://www.cancer.gov Contact a health care provider if you: Lose weight without trying. Have a persistent cough and wheezing. Feel short of breath. Get tired easily. Have bone or joint pain. Have difficulty swallowing. Notice that your voice is changing or getting hoarse. Have pain that does not get better with medicine. Get help right away if you: Cough up blood. Have new breathing problems. Have chest pain. Have a fever. Have swelling in an ankle, leg, or arm, or the face or neck. Have paralysis in your face. Are very confused. Have a drooping eyelid. Summary Lung cancer is an abnormal growth of cancerous cells that forms a mass (malignant tumor) in a lung. There are several types of lung cancer. The types are based on the appearance of the tumor cells. The two most common types are non-small cell and small cell. The most common cause of lung cancer is smoking tobacco. Early symptoms include a lasting cough, possibly with blood, and fatigue, unexplained weight loss, and shortness of breath. After diagnosis, treatment depends on the type and stage of your cancer. This information is not intended to replace advice given to you by your health care provider. Make sure you discuss any questions you have with your health care provider. Document Revised: 07/02/2020 Document Reviewed: 07/02/2020 Elsevier Patient Education  2021 [place]. Liver Biopsy  The liver is a large organ in the upper right side of the abdomen. A liver biopsy is a procedure in which a tissue sample is taken from the liver and examined under a microscope. There are three types of liver biopsies: Percutaneous. A needle is used to remove a sample through an incision in your abdomen. Laparoscopic. Several incisions are made in the abdomen. A sample is removed with the help of a tiny camera. Transjugular. An incision is made in your neck in the area of the  jugular vein. A sample is removed through a small flexible tube that is passed down the blood vessel and into your liver. Tell a health care provider about: Any allergies you have. All medicines you are taking, including vitamins, herbs, eye drops, creams, and over-the-counter medicines. Any problems you or family members have had with anesthetic medicines. Any blood disorders you have. Any surgeries you have had. Any medical conditions you have. Whether you are pregnant or may be pregnant. What are the risks? Generally, this is a safe procedure. However, problems can occur and include: Bleeding. Infection. Bruising. Pain. Injury to nearby organs or tissues, such as nerves, gallbladder, liver, or lungs. What happens before the procedure? Eating and drinking restrictions You may be asked not to drink or eat for 6-8 hours before the liver biopsy. You may be allowed to eat a light breakfast. Talk to your health care provider about when you should stop eating and drinking. Medicines Ask your health care provider about: Changing or stopping your regular medicines. This is especially important if you are taking diabetes medicines or blood thinners. Taking medicines such as aspirin and ibuprofen. These medicines can thin your blood. Do not take these medicines unless your health care provider tells you to take them. Taking over-the-counter medicines, vitamins, herbs, and supplements. General instructions Do not use any products that contain nicotine or tobacco, such as cigarettes and e-cigarettes. If you need help quitting,  ask your health care provider. Plan to have someone take you home from the hospital or clinic. Plan to have a responsible adult care for you for at least 24 hours after you leave the hospital or clinic. This is important. You may have blood or urine tests. Ask your health care provider what steps will be taken to prevent infection. These may include: Removing hair at the  surgery site. Washing skin with a germ-killing soap. Taking antibiotic medicine. What happens during the procedure? An IV will be inserted into one of your veins. You will be given one or more of the following: A medicine to help you relax (sedative). A medicine to numb the area (local anesthetic). A medicine to make you fall asleep (general anesthetic). Your health care provider will use one of the following procedures to remove samples from your liver. These procedures may vary among health care providers and hospitals. Percutaneous liver biopsy You will lie on your back, with your right hand over your head. A health care provider will locate your liver by tapping and pressing on the right side of your abdomen, or by using an ultrasound or CT scan. A local anesthetic will be used to numb an area at the bottom of your last right rib. A small incision will be made in the numbed area. A biopsy needle will be inserted into the incision. Several samples of liver tissue will be taken. You will be asked to hold your breath as each sample is taken. The incision will be closed with stitches (sutures). A bandage (dressing) may be placed over the incision. Laparoscopic liver biopsy You will lie on your back. Several small incisions will be made in your abdomen. Your health care provider will pass a tiny camera through one incision. The camera will allow the liver to be viewed on a TV monitor in the operating room. Tools will be passed through the other incision or incisions. Samples of the liver will be removed using the tools. The incisions will be closed with stitches (sutures). A bandage (dressing) may be placed over the incisions. Transjugular liver biopsy You will lie on your back on an X-ray table, with your head turned to your left. An area on your neck, just over your jugular vein, will be numbed. An incision will be made in the numbed area. A tiny tube will be inserted through the  incision. The tube will be passed into the jugular vein to a blood vessel in the liver called the hepatic vein. A dye will be injected through the tube. X-rays will be taken. The dye will make the blood vessels in the liver light up on the X-rays. The biopsy needle will be placed through the tube until it reaches the liver. Samples of liver tissue will be taken with the biopsy needle. The needle and the tube will be removed. The incision will be closed with stitches (sutures). A bandage (dressing) may be placed over the incision. What happens after the procedure? Your blood pressure, heart rate, breathing rate, and blood oxygen level will be monitored until you leave the hospital or clinic. You will be asked to rest quietly for 2-4 hours or longer. You will be closely monitored for bleeding from the biopsy site. You may be allowed to go home when the medicines have worn off and you can walk, drink, eat, and use the bathroom. Summary A liver biopsy is a procedure in which a tissue sample is taken from the liver and examined under a  microscope. This is a safe procedure, but problems can occur, including bleeding, infection, pain, or injury to nearby organs or tissues. Ask your health care provider about changing or stopping your regular medicines. Plan to have someone take you home from the hospital or clinic and to be with you for 24 hours after the procedure. This information is not intended to replace advice given to you by your health care provider. Make sure you discuss any questions you have with your health care provider. Document Revised: 11/09/2017 Document Reviewed: 11/10/2017 Elsevier Patient Education  2021 [place]. https://www.uspreventiveservicestaskforce.org/uspstf/draft-recommendation/lung-cancer-screening-2020#:~:text=The%20National%20Comprehensive%20Cancer%20Network,20%20pack%2Dyear%20smoking%20history">

## 2021-04-01 ENCOUNTER — Other Ambulatory Visit: Payer: Self-pay

## 2021-04-01 ENCOUNTER — Ambulatory Visit
Admission: RE | Admit: 2021-04-01 | Discharge: 2021-04-01 | Disposition: A | Discharge status: Home / Self Care | Payer: Medicare HMO | Source: Ambulatory Visit | Attending: Radiation Oncology | Admitting: Radiation Oncology

## 2021-04-01 VITALS — BP 142/69 | HR 67 | Temp 97.2°F | Resp 16 | Wt 124.5 lb

## 2021-04-01 DIAGNOSIS — R918 Other nonspecific abnormal finding of lung field: Secondary | ICD-10-CM | POA: Diagnosis not present

## 2021-04-01 DIAGNOSIS — Z923 Personal history of irradiation: Secondary | ICD-10-CM | POA: Diagnosis not present

## 2021-04-01 DIAGNOSIS — G939 Disorder of brain, unspecified: Secondary | ICD-10-CM | POA: Diagnosis not present

## 2021-04-01 DIAGNOSIS — K769 Liver disease, unspecified: Secondary | ICD-10-CM | POA: Diagnosis not present

## 2021-04-01 DIAGNOSIS — C3490 Malignant neoplasm of unspecified part of unspecified bronchus or lung: Secondary | ICD-10-CM

## 2021-04-01 LAB — SURGICAL PATHOLOGY

## 2021-04-01 NOTE — Progress Notes (Signed)
Radiation Oncology Follow up Note  Name: Doris Johnson   Date:   04/01/2021 MRN:  [id number] DOB: 1950/11/15    This 70 y.o. female presents to the clinic today for 1 month follow-up status post palliative radiation therapy to her mediastinum.  Patient with known presumed metastatic small cell lung cancer extensive stage with both liver and possibly brain metastasis.  REFERRING PROVIDER: Leonel Ramsay, MD  HPI: Patient is a 70 year old female now seen at 1 month having palliative radiation therapy to her mediastinum for extensive stage small cell lung cancer.  She is done well she states the pain is markedly resolved although there is occasional still some tenderness in her chest.  She specifically denies cough hemoptysis.Marland Kitchen  Her response by CT criteria has been mixed.  Her mediastinal mass is still large without significant change.  She also has slight increase in the size of the left hepatic lobe which has been recently rebiopsied with pathology pending.  She also had repeat MRI of her brain showing 2 small nodules in the right cerebellum and right temporal lobe unchanged from December and there is intrinsically T1 hyperintense and not certainly malignant.  Recommendation was for continued imaging surveillance.  She is currently onTecentriq which she is tolerating well.  COMPLICATIONS OF TREATMENT: none  FOLLOW UP COMPLIANCE: keeps appointments   PHYSICAL EXAM:  BP (!) 142/69   Pulse 67   Temp (!) 97.2 F (36.2 C) (Tympanic)   Resp 16   Wt 124 lb 8 oz (56.5 kg)   BMI 20.09 kg/m  Thin female in NAD.  Well-developed well-nourished patient in NAD. HEENT reveals PERLA, EOMI, discs not visualized.  Oral cavity is clear. No oral mucosal lesions are identified. Neck is clear without evidence of cervical or supraclavicular adenopathy. Lungs are clear to A&P. Cardiac examination is essentially unremarkable with regular rate and rhythm without murmur rub or thrill. Abdomen is benign with no  organomegaly or masses noted. Motor sensory and DTR levels are equal and symmetric in the upper and lower extremities. Cranial nerves II through XII are grossly intact. Proprioception is intact. No peripheral adenopathy or edema is identified. No motor or sensory levels are noted. Crude visual fields are within normal range. RADIOLOGY RESULTS: CT scans chest abdomen and pelvis as well as MRI of brain reviewed compatible with above-stated findings  PLAN: Present time patient continues on immune immunotherapy withTecentriq under medical oncology's direction.  I would continue to observe the brain as this is not definitive for metastatic disease.  She has had a rebiopsy of her liver since there was initially some question about the diagnosis of small cell lung cancer.  I have asked to see her back in 3 to 4 months for follow-up.  Would be happy to reevaluate her anytime should further palliative treatment be indicated.  I would like to take this opportunity to thank you for allowing me to participate in the care of your patient.Noreene Filbert, MD

## 2021-04-13 ENCOUNTER — Inpatient Hospital Stay: Payer: Medicare HMO

## 2021-04-13 ENCOUNTER — Encounter: Payer: Self-pay | Admitting: Internal Medicine

## 2021-04-13 ENCOUNTER — Inpatient Hospital Stay (HOSPITAL_BASED_OUTPATIENT_CLINIC_OR_DEPARTMENT_OTHER): Payer: Medicare HMO | Admitting: Internal Medicine

## 2021-04-13 DIAGNOSIS — R079 Chest pain, unspecified: Secondary | ICD-10-CM | POA: Diagnosis not present

## 2021-04-13 DIAGNOSIS — C787 Secondary malignant neoplasm of liver and intrahepatic bile duct: Secondary | ICD-10-CM | POA: Diagnosis not present

## 2021-04-13 DIAGNOSIS — Z5112 Encounter for antineoplastic immunotherapy: Secondary | ICD-10-CM | POA: Diagnosis not present

## 2021-04-13 DIAGNOSIS — E049 Nontoxic goiter, unspecified: Secondary | ICD-10-CM | POA: Diagnosis not present

## 2021-04-13 DIAGNOSIS — C3490 Malignant neoplasm of unspecified part of unspecified bronchus or lung: Secondary | ICD-10-CM

## 2021-04-13 DIAGNOSIS — K5903 Drug induced constipation: Secondary | ICD-10-CM | POA: Diagnosis not present

## 2021-04-13 DIAGNOSIS — C7931 Secondary malignant neoplasm of brain: Secondary | ICD-10-CM | POA: Diagnosis not present

## 2021-04-13 DIAGNOSIS — R5383 Other fatigue: Secondary | ICD-10-CM | POA: Diagnosis not present

## 2021-04-13 DIAGNOSIS — C3412 Malignant neoplasm of upper lobe, left bronchus or lung: Secondary | ICD-10-CM | POA: Diagnosis not present

## 2021-04-13 DIAGNOSIS — T402X5A Adverse effect of other opioids, initial encounter: Secondary | ICD-10-CM | POA: Diagnosis not present

## 2021-04-13 LAB — CBC WITH DIFFERENTIAL/PLATELET
Abs Immature Granulocytes: 0 10*3/uL (ref 0.00–0.07)
Basophils Absolute: 0 10*3/uL (ref 0.0–0.1)
Basophils Relative: 0 %
Eosinophils Absolute: 0 10*3/uL (ref 0.0–0.5)
Eosinophils Relative: 0 %
HCT: 31.8 % — ABNORMAL LOW (ref 36.0–46.0)
Hemoglobin: 10.5 g/dL — ABNORMAL LOW (ref 12.0–15.0)
Immature Granulocytes: 0 %
Lymphocytes Relative: 16 %
Lymphs Abs: 0.4 10*3/uL — ABNORMAL LOW (ref 0.7–4.0)
MCH: 30.3 pg (ref 26.0–34.0)
MCHC: 33 g/dL (ref 30.0–36.0)
MCV: 91.9 fL (ref 80.0–100.0)
Monocytes Absolute: 0.2 10*3/uL (ref 0.1–1.0)
Monocytes Relative: 11 %
Neutro Abs: 1.6 10*3/uL — ABNORMAL LOW (ref 1.7–7.7)
Neutrophils Relative %: 73 %
Platelets: 239 10*3/uL (ref 150–400)
RBC: 3.46 MIL/uL — ABNORMAL LOW (ref 3.87–5.11)
RDW: 13.6 % (ref 11.5–15.5)
WBC: 2.3 10*3/uL — ABNORMAL LOW (ref 4.0–10.5)
nRBC: 0 % (ref 0.0–0.2)

## 2021-04-13 LAB — COMPREHENSIVE METABOLIC PANEL
ALT: 11 U/L (ref 0–44)
AST: 17 U/L (ref 15–41)
Albumin: 4 g/dL (ref 3.5–5.0)
Alkaline Phosphatase: 42 U/L (ref 38–126)
Anion gap: 9 (ref 5–15)
BUN: 9 mg/dL (ref 8–23)
CO2: 24 mmol/L (ref 22–32)
Calcium: 9.6 mg/dL (ref 8.9–10.3)
Chloride: 101 mmol/L (ref 98–111)
Creatinine, Ser: 0.44 mg/dL (ref 0.44–1.00)
GFR, Estimated: >60 mL/min (ref >60)
Glucose, Bld: 133 mg/dL — ABNORMAL HIGH (ref 70–99)
Potassium: 3.7 mmol/L (ref 3.5–5.1)
Sodium: 134 mmol/L — ABNORMAL LOW (ref 135–145)
Total Bilirubin: 0.8 mg/dL (ref 0.3–1.2)
Total Protein: 6.7 g/dL (ref 6.5–8.1)

## 2021-04-13 MED ORDER — SODIUM CHLORIDE 0.9% FLUSH
10.0000 mL | Freq: Once | INTRAVENOUS | Status: AC
Start: 2021-04-13 — End: 2021-04-13
  Administered 2021-04-13: 10 mL via INTRAVENOUS
  Filled 2021-04-13: qty 10

## 2021-04-13 MED ORDER — SODIUM CHLORIDE 0.9 % IV SOLN
Freq: Once | INTRAVENOUS | Status: AC
  Filled 2021-04-13: qty 250

## 2021-04-13 MED ORDER — HEPARIN SOD (PORK) LOCK FLUSH 100 UNIT/ML IV SOLN
INTRAVENOUS | Status: AC
  Filled 2021-04-13: qty 5

## 2021-04-13 MED ORDER — HEPARIN SOD (PORK) LOCK FLUSH 100 UNIT/ML IV SOLN
500.0000 [IU] | Freq: Once | INTRAVENOUS | Status: AC | PRN
  Administered 2021-04-13: 500 [IU]
  Filled 2021-04-13: qty 5

## 2021-04-13 MED ORDER — SODIUM CHLORIDE 0.9 % IV SOLN
1200.0000 mg | Freq: Once | INTRAVENOUS | Status: AC
  Administered 2021-04-13: 1200 mg via INTRAVENOUS
  Filled 2021-04-13: qty 20

## 2021-04-13 NOTE — Progress Notes (Signed)
Patient here for follow up. She reports on going decreased appetite and tiredness.

## 2021-04-13 NOTE — Progress Notes (Signed)
[facility] NOTE  Patient Care Team: Leonel Ramsay, MD as PCP - General (Infectious Diseases) Telford Nab, RN as Oncology Nurse Navigator  CHIEF COMPLAINTS/PURPOSE OF CONSULTATION: Small cell lung cancer  #  Oncology History  Small cell carcinoma of lung metastatic to liver ([place])  10/30/2020 Initial Diagnosis   Small cell lung cancer ([place])   11/12/2020 Cancer Staging   Staging form: Lung, AJCC 8th Edition - Clinical: Stage IVB (cT4, cNX, pM1c) - Signed by Sindy Guadeloupe, MD on 11/12/2020   12/01/2020 -  Chemotherapy    Patient is on Treatment Plan: LUNG SCLC CARBOPLATIN + ETOPOSIDE + ATEZOLIZUMAB INDUCTION Q21D / ATEZOLIZUMAB MAINTENANCE Q21D         HISTORY OF PRESENTING ILLNESS:  Doris Johnson 70 y.o.  female stage IV small cell lung cancer-follows up with Dr. Janese Banks; is here to proceed with chemotherapy/discuss liver biopsy results.  Patient complains of "sleepiness" especially in the daytime.  States that she has some difficulty sleeping at night.  She is concerned about her pain medication narcotics causing her sleepiness.  Has mild to moderate fatigue.  Her malignancy related pain left chest-is currently stable.  She is taking oxycodone 4 times a day and also MS Contin 50 mg twice a day.  Denies any headaches.  Is any nausea vomiting.  Review of Systems  Constitutional: Positive for malaise/fatigue. Negative for chills, diaphoresis and fever.  HENT: Negative for nosebleeds and sore throat.   Eyes: Negative for double vision.  Respiratory: Negative for cough, hemoptysis, sputum production, shortness of breath and wheezing.   Cardiovascular: Negative for chest pain, palpitations, orthopnea and leg swelling.  Gastrointestinal: Negative for abdominal pain, blood in stool, constipation, diarrhea, heartburn, melena, nausea and vomiting.  Genitourinary: Negative for dysuria, frequency and urgency.  Musculoskeletal: Positive for back pain and  joint pain.  Skin: Negative.  Negative for itching and rash.  Neurological: Negative for dizziness, tingling, focal weakness, weakness and headaches.  Endo/Heme/Allergies: Does not bruise/bleed easily.  Psychiatric/Behavioral: Negative for depression. The patient is not nervous/anxious and does not have insomnia.      MEDICAL HISTORY:  Past Medical History:  Diagnosis Date  . Cancer ([place])   . Cataract   . Diabetes mellitus without complication ([place])   . Hyperlipidemia   . Hypertension   . Hypothyroidism    doctor's keeping an eye on thyroid     SURGICAL HISTORY: Past Surgical History:  Procedure Laterality Date  . ABDOMINAL HYSTERECTOMY    . PORTA CATH INSERTION N/A 11/30/2020   Procedure: PORTA CATH INSERTION;  Surgeon: Algernon Huxley, MD;  Location: [facility];  Service: Cardiovascular;  Laterality: N/A;  . VIDEO BRONCHOSCOPY WITH ENDOBRONCHIAL NAVIGATION N/A 10/21/2020   Procedure: VIDEO BRONCHOSCOPY WITH ENDOBRONCHIAL NAVIGATION;  Surgeon: Ottie Glazier, MD;  Location: [facility];  Service: Thoracic;  Laterality: N/A;  . VIDEO BRONCHOSCOPY WITH ENDOBRONCHIAL ULTRASOUND N/A 10/21/2020   Procedure: VIDEO BRONCHOSCOPY WITH ENDOBRONCHIAL ULTRASOUND;  Surgeon: Ottie Glazier, MD;  Location: [facility];  Service: Thoracic;  Laterality: N/A;    SOCIAL HISTORY: Social History   Socioeconomic History  . Marital status: Single    Spouse name: Not on file  . Number of children: Not on file  . Years of education: Not on file  . Highest education level: Not on file  Occupational History  . Not on file  Tobacco Use  . Smoking status: Never Smoker  . Smokeless tobacco: Never Used  Vaping Use  .  Vaping Use: Never used  Substance and Sexual Activity  . Alcohol use: No  . Drug use: No  . Sexual activity: Not on file  Other Topics Concern  . Not on file  Social History Narrative  . Not on file   Social Determinants of Health   Financial Resource Strain: Not on file   Food Insecurity: Not on file  Transportation Needs: Not on file  Physical Activity: Not on file  Stress: Not on file  Social Connections: Not on file  Intimate Partner Violence: Not on file    FAMILY HISTORY: Family History  Problem Relation Age of Onset  . Cancer Mother        breast  . Hypertension Mother   . Breast cancer Mother 28  . Heart disease Father   . Hyperlipidemia Brother   . Heart disease Brother     ALLERGIES:  has No Known Allergies.  MEDICATIONS:  Current Outpatient Medications  Medication Sig Dispense Refill  . docusate sodium (COLACE) 100 MG capsule Take 200 mg by mouth at bedtime.    . fluticasone (FLONASE) 50 MCG/ACT nasal spray Place 2 sprays into both nostrils daily. 16 g 5  . lidocaine-prilocaine (EMLA) cream Apply 1 application topically as needed. Apply small amount to port site at least 1 hour prior to it being accessed, cover with plastic wrap 30 g 1  . LORazepam (ATIVAN) 0.5 MG tablet Take 1 tablet (0.5 mg total) by mouth every 6 (six) hours as needed (Nausea or vomiting). 30 tablet 0  . morphine (MS CONTIN) 15 MG 12 hr tablet Take 1 tablet (15 mg total) by mouth every 12 (twelve) hours. 60 tablet 0  . ondansetron (ZOFRAN) 8 MG tablet Take 1 tablet (8 mg total) by mouth 2 (two) times daily as needed for refractory nausea / vomiting. Start on day 3 after carboplatin chemo. 30 tablet 1  . oxyCODONE (ROXICODONE) 15 MG immediate release tablet Take 1 tablet (15 mg total) by mouth every 4 (four) hours as needed for pain. 180 tablet 0  . potassium chloride SA (KLOR-CON) 20 MEQ tablet Take 2 tablets (40 mEq total) by mouth daily. 7 tablet 0  . prochlorperazine (COMPAZINE) 10 MG tablet Take 1 tablet (10 mg total) by mouth every 6 (six) hours as needed (Nausea or vomiting). 30 tablet 1  . sennosides-docusate sodium (SENOKOT-S) 8.6-50 MG tablet Take 1 tablet by mouth daily.    Marland Kitchen lisinopril (PRINIVIL,ZESTRIL) 20 MG tablet TAKE 1 TABLET BY MOUTH EVERY DAY  (Patient not taking: Reported on 04/13/2021) 30 tablet 0  . metFORMIN (GLUCOPHAGE) 500 MG tablet Take 1,000 mg by mouth 2 (two) times daily with a meal.  (Patient not taking: No sig reported)    . rosuvastatin (CRESTOR) 10 MG tablet Take 10 mg by mouth every evening.  (Patient not taking: No sig reported)    . triamterene-hydrochlorothiazide (DYAZIDE) 37.5-25 MG capsule TAKE ONE CAPSULE BY MOUTH DAILY (Patient not taking: No sig reported) 30 capsule 0   No current facility-administered medications for this visit.   Facility-Administered Medications Ordered in Other Visits  Medication Dose Route Frequency Provider Last Rate Last Admin  . sodium chloride flush (NS) 0.9 % injection 10 mL  10 mL Intravenous PRN Sindy Guadeloupe, MD   10 mL at 12/15/20 0850      .  PHYSICAL EXAMINATION: ECOG PERFORMANCE STATUS: 1 - Symptomatic but completely ambulatory  Vitals:   04/13/21 0947  BP: (!) 154/75  Pulse: 60  Resp:  16  Temp: 97.8 F (36.6 C)   Filed Weights   04/13/21 0947  Weight: 56 kg    Physical Exam Constitutional:      Comments: Thin built female patient.  Accompanied by her daughter.  Ambulating independently.  HENT:     Head: Normocephalic and atraumatic.     Mouth/Throat:     Pharynx: No oropharyngeal exudate.  Eyes:     Pupils: Pupils are equal, round, and reactive to light.  Cardiovascular:     Rate and Rhythm: Normal rate and regular rhythm.  Pulmonary:     Effort: No respiratory distress.     Breath sounds: No wheezing.     Comments: Decreased air entry bilaterally.  No wheeze or crackles Abdominal:     General: Bowel sounds are normal. There is no distension.     Palpations: Abdomen is soft. There is no mass.     Tenderness: There is no abdominal tenderness. There is no guarding or rebound.  Musculoskeletal:        General: No tenderness. Normal range of motion.     Cervical back: Normal range of motion and neck supple.  Skin:    General: Skin is warm.   Neurological:     Mental Status: She is alert and oriented to person, place, and time.  Psychiatric:        Mood and Affect: Affect normal.      LABORATORY DATA:  I have reviewed the data as listed Lab Results  Component Value Date   WBC 2.3 (L) 04/13/2021   HGB 10.5 (L) 04/13/2021   HCT 31.8 (L) 04/13/2021   MCV 91.9 04/13/2021   PLT 239 04/13/2021   Recent Labs    03/02/21 0834 03/23/21 0940 04/13/21 0919  NA 138 136 134*  K 3.2* 3.8 3.7  CL 104 104 101  CO2 27 25 24   GLUCOSE 102* 132* 133*  BUN 10 8 9   CREATININE 0.56 0.48 0.44  CALCIUM 9.7 9.4 9.6  GFRNONAA >60 >60 >60  PROT 6.8 6.2* 6.7  ALBUMIN 3.9 3.6 4.0  AST 15 15 17   ALT 8 9 11   ALKPHOS 39 39 42  BILITOT 0.7 0.7 0.8    RADIOGRAPHIC STUDIES: I have personally reviewed the radiological images as listed and agreed with the findings in the report. MR Brain W Wo Contrast  Result Date: 03/22/2021 CLINICAL DATA:  History of lung cancer. Possible cerebellar metastases. EXAM: MRI HEAD WITHOUT AND WITH CONTRAST TECHNIQUE: Multiplanar, multiecho pulse sequences of the brain and surrounding structures were obtained without and with intravenous contrast. CONTRAST:  57mL GADAVIST GADOBUTROL 1 MMOL/ML IV SOLN COMPARISON:  11/09/2020 FINDINGS: Brain: The 2 hyperintense foci seen in the right cerebellum and right posterolateral temporal lobe are intrinsically T1 hyperintense and unchanged. No associated mass effect or edema. No associated susceptibility artifact on gradient imaging. No acute or subacute infarct, hemorrhage, hydrocephalus, or atrophy Vascular: Normal flow voids and vascular enhancements Skull and upper cervical spine: Normal marrow signal Sinuses/Orbits: Bilateral cataract resection.  No pathologic finding IMPRESSION: Two small nodules in the right cerebellum and right temporal lobe are unchanged from December 2021. These are intrinsically T1 hyperintense and not certainly malignant. Recommend continued imaging  surveillance. Electronically Signed   By: Monte Fantasia M.D.   On: 03/22/2021 19:07   CT CHEST ABDOMEN PELVIS W CONTRAST  Result Date: 03/16/2021 CLINICAL DATA:  Follow-up small cell lung carcinoma.  Chest pain. EXAM: CT CHEST, ABDOMEN, AND PELVIS WITH CONTRAST TECHNIQUE: Multidetector  CT imaging of the chest, abdomen and pelvis was performed following the standard protocol during bolus administration of intravenous contrast. CONTRAST:  82mL OMNIPAQUE IOHEXOL 300 MG/ML  SOLN COMPARISON:  01/05/2021 FINDINGS: CT CHEST FINDINGS Cardiovascular: No acute findings. Mediastinum/Lymph Nodes: Multinodular goiter with substernal extension remains stable. A heterogeneously enhancing mediastinal mass is again seen which is centered in the AP window, and encases the aortic arch and left pulmonary artery and extends superiorly into the lateral aortic region. This measures 6.9 x 6.6 cm, without significant change compared to prior study. No new or enlarging sites of lymphadenopathy identified. Lungs/Pleura: Band like opacity again seen in the anterior left upper lobe, consistent with atelectasis. Multiple new ill-defined nodular opacities are seen in the lingula, with differential diagnosis including infection, drug reaction, or less likely metastatic disease. 6 mm nodule in the left lung apex remains stable. New small left pleural effusion also seen. Musculoskeletal:  No suspicious bone lesions identified. CT ABDOMEN AND PELVIS FINDINGS Hepatobiliary: Low-attenuation mass in segment 4B left lobe currently measures 5.8 x 5.7 cm, slightly increased in size since previous study. No other liver masses are identified. Gallbladder is unremarkable. No evidence of biliary ductal dilatation. Pancreas:  No mass or inflammatory changes. Spleen:  Within normal limits in size and appearance. Adrenals/Urinary tract:  No masses or hydronephrosis. Stomach/Bowel: No evidence of obstruction, inflammatory process, or abnormal fluid  collections. Vascular/Lymphatic: No pathologically enlarged lymph nodes identified. No acute vascular findings. Reproductive: Prior hysterectomy noted. Adnexal regions are unremarkable in appearance. Other:  Increased diffuse body wall edema noted. Musculoskeletal:  No suspicious bone lesions identified. IMPRESSION: No significant change in large mediastinal mass. Slight increase in size of left hepatic lobe metastasis. New ill-defined nodular opacities in lingula, with differential diagnosis including infection, drug reaction, or less likely metastatic disease. New small pleural effusion. Stable multinodular goiter with substernal extension. Electronically Signed   By: Marlaine Hind M.D.   On: 03/16/2021 13:55   US BIOPSY (LIVER)  Result Date: 03/30/2021 INDICATION: 70 year old woman with history of small cell lung cancer presents to interventional radiology for biopsy left liver mass EXAM: Ultrasound-guided biopsy of left hepatic mass MEDICATIONS: None. ANESTHESIA/SEDATION: Moderate (conscious) sedation was employed during this procedure. A total of Versed 1 mg and Fentanyl 25 mcg was administered intravenously. Moderate Sedation Time: 7 minutes. The patient's level of consciousness and vital signs were monitored continuously by radiology nursing throughout the procedure under my direct supervision. COMPLICATIONS: None immediate. PROCEDURE: Informed written consent was obtained from the patient after a thorough discussion of the procedural risks, benefits and alternatives. All questions were addressed. Maximal Sterile Barrier Technique was utilized including caps, mask, sterile gowns, sterile gloves, sterile drape, hand hygiene and skin antiseptic. A timeout was performed prior to the initiation of the procedure. Patient position supine on the ultrasound table. Right upper quadrant skin prepped and draped in usual sterile fashion. Following local lidocaine administration, 17 gauge introducer needle was advanced  into the left liver mass, and three 18 gauge cores were obtained utilizing continuous ultrasound guidance. Samples were sent to pathology in formalin. Needle removed and hemostasis achieved with 5 minutes of manual compression. Post procedure ultrasound images showed no evidence of significant hemorrhage. IMPRESSION: Ultrasound-guided biopsy of left hepatic mass as above. Electronically Signed   By: Miachel Roux M.D.   On: 03/30/2021 14:41    ASSESSMENT & PLAN:   Small cell carcinoma of lung metastatic to liver [facility]) # Extensive stage small cell lung cancer with liver metastases  s/p 4 cycles of carbo etoposide Tecentriq chemotherapy.    Currently on maintenance Tecentriq; CT May 2022-slight increase in liver lesion-however biopsy proven small cell cancer.  I reviewed the pathology with the patient and daughter.  Consider repeat imaging in few months-and if progressive disease noted consider second line therapy.  # proceed with tecentriq today; Labs today reviewed;  acceptable for treatment today   # sleepy [not tired]-worse-discussed regarding sleep hygiene; come off MS contin; continue oxycodone prn.   # Pain medications-stable.  Changes made pain medication as above  # Possible brain metastases: MRI May  2022- asymptomatic.  Monitor for now.  # DISPOSITION: # chemo today # follow in 3 weeks- Dr.rao; labs- cbc/cmp;tecentriq- Dr.B  All questions were answered. The patient knows to call the clinic with any problems, questions or concerns.   Cammie Sickle, MD 04/13/2021 1:04 PM

## 2021-04-13 NOTE — Assessment & Plan Note (Addendum)
#   Extensive stage small cell lung cancer with liver metastases s/p 4 cycles of carbo etoposide Tecentriq chemotherapy.    Currently on maintenance Tecentriq; CT May 2022-slight increase in liver lesion-however biopsy proven small cell cancer.  I reviewed the pathology with the patient and daughter.  Consider repeat imaging in few months-and if progressive disease noted consider second line therapy.  # proceed with tecentriq today; Labs today reviewed;  acceptable for treatment today   # sleepy [not tired]-worse-discussed regarding sleep hygiene; come off MS contin; continue oxycodone prn.   # Pain medications-stable.  Changes made pain medication as above  # Possible brain metastases: MRI May  2022- asymptomatic.  Monitor for now.  # DISPOSITION: # chemo today # follow in 3 weeks- Dr.rao; labs- cbc/cmp;tecentriq- Dr.B

## 2021-04-13 NOTE — Progress Notes (Signed)
Nutrition Follow-up:   Patient with small cell lung cancer with liver metastases.  Patient receiving carbo, etoposide, tecentriq.    Met with patient during infusion.  Patient reports that appetite is about the same.  Feels tired and planning to reduce dose of pain medications.  Constipation also from pain meds.  Reports that she is still drinking 2-3 ensure original shakes per day. Does not like the plus shakes.  Yesterday was able to eat cereal, sandwich for lunch and supper was chicken thigh.      Medications: reviewed  Labs: reviewed  Anthropometrics:   Weight 123 lb 6.4 oz today  124 lb on 3/22 133 lb on 3/1 138 lb on 2/8 147 lb on 1/8   NUTRITION DIAGNOSIS: Inadequate oral intake continues   INTERVENTION:  Congratulated patient on stable weight Encouraged patient to continue ensure shakes as able.  Coupons given Encouraged bowel regimen to help with constipation    MONITORING, EVALUATION, GOAL: weight trends, intake   NEXT VISIT: Tuesday, June 21 during infusion  Nyiesha Beever B. Zenia Resides, [place], [place] Registered Dietitian [phone] (mobile)

## 2021-04-13 NOTE — Patient Instructions (Signed)
[facility]  Discharge Instructions: Thank you for choosing [facility] to provide your oncology and hematology care.  If you have a lab appointment with the Cancer Center, please go directly to the Cancer Center and check in at the registration area.  Wear comfortable clothing and clothing appropriate for easy access to any Portacath or PICC line.   We strive to give you quality time with your provider. You may need to reschedule your appointment if you arrive late (15 or more minutes).  Arriving late affects you and other patients whose appointments are after yours.  Also, if you miss three or more appointments without notifying the office, you may be dismissed from the clinic at the provider's discretion.      For prescription refill requests, have your pharmacy contact our office and allow 72 hours for refills to be completed.    Today you received the following chemotherapy and/or immunotherapy agents Tecentriq Atezolizumab injection What is this medicine? ATEZOLIZUMAB (a te zoe LIZ ue mab) is a monoclonal antibody. It is used to treat bladder cancer (urothelial cancer), liver cancer, lung cancer, and melanoma. This medicine may be used for other purposes; ask your health care provider or pharmacist if you have questions. COMMON BRAND NAME(S): Tecentriq What should I tell my health care provider before I take this medicine? They need to know if you have any of these conditions:  autoimmune diseases like Crohn's disease, ulcerative colitis, or lupus  have had or planning to have an allogeneic stem cell transplant (uses someone else's stem cells)  history of organ transplant  history of radiation to the chest  nervous system problems like myasthenia gravis or Guillain-Barre syndrome  an unusual or allergic reaction to atezolizumab, other medicines, foods, dyes, or preservatives  pregnant or trying to get  pregnant  breast-feeding How should I use this medicine? This medicine is for infusion into a vein. It is given by a health care professional in a hospital or clinic setting. A special MedGuide will be given to you before each treatment. Be sure to read this information carefully each time. Talk to your pediatrician regarding the use of this medicine in children. Special care may be needed. Overdosage: If you think you have taken too much of this medicine contact a poison control center or emergency room at once. NOTE: This medicine is only for you. Do not share this medicine with others. What if I miss a dose? It is important not to miss your dose. Call your doctor or health care professional if you are unable to keep an appointment. What may interact with this medicine? Interactions have not been studied. This list may not describe all possible interactions. Give your health care provider a list of all the medicines, herbs, non-prescription drugs, or dietary supplements you use. Also tell them if you smoke, drink alcohol, or use illegal drugs. Some items may interact with your medicine. What should I watch for while using this medicine? Your condition will be monitored carefully while you are receiving this medicine. You may need blood work done while you are taking this medicine. Do not become pregnant while taking this medicine or for at least 5 months after stopping it. Women should inform their doctor if they wish to become pregnant or think they might be pregnant. There is a potential for serious side effects to an unborn child. Talk to your health care professional or pharmacist for more information. Do not breast-feed an infant   while taking this medicine or for at least 5 months after the last dose. What side effects may I notice from receiving this medicine? Side effects that you should report to your doctor or health care professional as soon as possible:  allergic reactions like skin  rash, itching or hives, swelling of the face, lips, or tongue  black, tarry stools  bloody or watery diarrhea  breathing problems  changes in vision  chest pain or chest tightness  chills  facial flushing  fever  headache  signs and symptoms of high blood sugar such as dizziness; dry mouth; dry skin; fruity breath; nausea; stomach pain; increased hunger or thirst; increased urination  signs and symptoms of liver injury like dark yellow or brown urine; general ill feeling or flu-like symptoms; light-colored stools; loss of appetite; nausea; right upper belly pain; unusually weak or tired; yellowing of the eyes or skin  stomach pain  trouble passing urine or change in the amount of urine Side effects that usually do not require medical attention (report to your doctor or health care professional if they continue or are bothersome):  bone pain  cough  diarrhea  joint pain  muscle pain  muscle weakness  swelling of arms or legs  tiredness  weight loss This list may not describe all possible side effects. Call your doctor for medical advice about side effects. You may report side effects to FDA at 1-800-FDA-1088. Where should I keep my medicine? This drug is given in a hospital or clinic and will not be stored at home. NOTE: This sheet is a summary. It may not cover all possible information. If you have questions about this medicine, talk to your doctor, pharmacist, or health care provider.  2021 Elsevier/Gold Standard (2020-07-30 13:59:34)       To help prevent nausea and vomiting after your treatment, we encourage you to take your nausea medication as directed.  BELOW ARE SYMPTOMS THAT SHOULD BE REPORTED IMMEDIATELY: . *FEVER GREATER THAN 100.4 F (38 C) OR HIGHER . *CHILLS OR SWEATING . *NAUSEA AND VOMITING THAT IS NOT CONTROLLED WITH YOUR NAUSEA MEDICATION . *UNUSUAL SHORTNESS OF BREATH . *UNUSUAL BRUISING OR BLEEDING . *URINARY PROBLEMS (pain or burning  when urinating, or frequent urination) . *BOWEL PROBLEMS (unusual diarrhea, constipation, pain near the anus) . TENDERNESS IN MOUTH AND THROAT WITH OR WITHOUT PRESENCE OF ULCERS (sore throat, sores in mouth, or a toothache) . UNUSUAL RASH, SWELLING OR PAIN  . UNUSUAL VAGINAL DISCHARGE OR ITCHING   Items with * indicate a potential emergency and should be followed up as soon as possible or go to the Emergency Department if any problems should occur.  Please show the CHEMOTHERAPY ALERT CARD or IMMUNOTHERAPY ALERT CARD at check-in to the Emergency Department and triage nurse.  Should you have questions after your visit or need to cancel or reschedule your appointment, please contact [facility]  [phone] and follow the prompts.  Office hours are 8:00 a.m. to 4:30 p.m. Monday - Friday. Please note that voicemails left after 4:00 p.m. may not be returned until the following business day.  We are closed weekends and major holidays. You have access to a nurse at all times for urgent questions. Please call the main number to the clinic [phone] and follow the prompts.  For any non-urgent questions, you may also contact your provider using MyChart. We now offer e-Visits for anyone 18 and older to request care online for non-urgent symptoms. For details visit   [email or web address].   Also download the MyChart app! Go to the app store, search "MyChart", open the app, select [facility], and log in with your MyChart username and password.  Due to Covid, a mask is required upon entering the hospital/clinic. If you do not have a mask, one will be given to you upon arrival. For doctor visits, patients may have 1 support person aged 18 or older with them. For treatment visits, patients cannot have anyone with them due to current Covid guidelines and our immunocompromised population.  

## 2021-04-20 ENCOUNTER — Other Ambulatory Visit: Payer: Self-pay | Admitting: Infectious Diseases

## 2021-04-20 ENCOUNTER — Other Ambulatory Visit (HOSPITAL_COMMUNITY): Payer: Self-pay | Admitting: Infectious Diseases

## 2021-04-20 DIAGNOSIS — R519 Headache, unspecified: Secondary | ICD-10-CM

## 2021-04-20 DIAGNOSIS — K5900 Constipation, unspecified: Secondary | ICD-10-CM | POA: Diagnosis not present

## 2021-04-20 DIAGNOSIS — K5909 Other constipation: Secondary | ICD-10-CM | POA: Diagnosis not present

## 2021-04-20 DIAGNOSIS — C3490 Malignant neoplasm of unspecified part of unspecified bronchus or lung: Secondary | ICD-10-CM | POA: Diagnosis not present

## 2021-04-20 DIAGNOSIS — I878 Other specified disorders of veins: Secondary | ICD-10-CM | POA: Diagnosis not present

## 2021-04-20 DIAGNOSIS — E119 Type 2 diabetes mellitus without complications: Secondary | ICD-10-CM | POA: Diagnosis not present

## 2021-04-20 DIAGNOSIS — R197 Diarrhea, unspecified: Secondary | ICD-10-CM | POA: Diagnosis not present

## 2021-04-20 DIAGNOSIS — K6389 Other specified diseases of intestine: Secondary | ICD-10-CM | POA: Diagnosis not present

## 2021-04-21 ENCOUNTER — Other Ambulatory Visit: Payer: Medicare HMO

## 2021-04-21 ENCOUNTER — Other Ambulatory Visit: Payer: Self-pay

## 2021-04-21 ENCOUNTER — Inpatient Hospital Stay: Payer: Medicare HMO | Attending: Hospice and Palliative Medicine | Admitting: Hospice and Palliative Medicine

## 2021-04-21 ENCOUNTER — Ambulatory Visit
Admission: RE | Admit: 2021-04-21 | Discharge: 2021-04-21 | Disposition: A | Discharge status: Home / Self Care | Payer: Medicare HMO | Source: Ambulatory Visit | Attending: Infectious Diseases | Admitting: Infectious Diseases

## 2021-04-21 DIAGNOSIS — G893 Neoplasm related pain (acute) (chronic): Secondary | ICD-10-CM

## 2021-04-21 DIAGNOSIS — C3490 Malignant neoplasm of unspecified part of unspecified bronchus or lung: Secondary | ICD-10-CM

## 2021-04-21 DIAGNOSIS — R0789 Other chest pain: Secondary | ICD-10-CM | POA: Insufficient documentation

## 2021-04-21 DIAGNOSIS — Z515 Encounter for palliative care: Secondary | ICD-10-CM

## 2021-04-21 DIAGNOSIS — R519 Headache, unspecified: Secondary | ICD-10-CM | POA: Insufficient documentation

## 2021-04-21 DIAGNOSIS — R5383 Other fatigue: Secondary | ICD-10-CM | POA: Insufficient documentation

## 2021-04-21 DIAGNOSIS — R42 Dizziness and giddiness: Secondary | ICD-10-CM | POA: Diagnosis not present

## 2021-04-21 DIAGNOSIS — R590 Localized enlarged lymph nodes: Secondary | ICD-10-CM | POA: Insufficient documentation

## 2021-04-21 DIAGNOSIS — E049 Nontoxic goiter, unspecified: Secondary | ICD-10-CM | POA: Insufficient documentation

## 2021-04-21 DIAGNOSIS — C3412 Malignant neoplasm of upper lobe, left bronchus or lung: Secondary | ICD-10-CM | POA: Insufficient documentation

## 2021-04-21 DIAGNOSIS — C787 Secondary malignant neoplasm of liver and intrahepatic bile duct: Secondary | ICD-10-CM | POA: Insufficient documentation

## 2021-04-21 DIAGNOSIS — C7931 Secondary malignant neoplasm of brain: Secondary | ICD-10-CM | POA: Insufficient documentation

## 2021-04-21 DIAGNOSIS — Z5112 Encounter for antineoplastic immunotherapy: Secondary | ICD-10-CM | POA: Insufficient documentation

## 2021-04-21 DIAGNOSIS — Z79899 Other long term (current) drug therapy: Secondary | ICD-10-CM | POA: Insufficient documentation

## 2021-04-21 DIAGNOSIS — Z8349 Family history of other endocrine, nutritional and metabolic diseases: Secondary | ICD-10-CM | POA: Insufficient documentation

## 2021-04-21 DIAGNOSIS — Z803 Family history of malignant neoplasm of breast: Secondary | ICD-10-CM | POA: Insufficient documentation

## 2021-04-21 DIAGNOSIS — Z87891 Personal history of nicotine dependence: Secondary | ICD-10-CM | POA: Insufficient documentation

## 2021-04-21 DIAGNOSIS — Z8249 Family history of ischemic heart disease and other diseases of the circulatory system: Secondary | ICD-10-CM | POA: Insufficient documentation

## 2021-04-21 MED ORDER — GADOBUTROL 1 MMOL/ML IV SOLN
5.0000 mL | Freq: Once | INTRAVENOUS | Status: AC | PRN
  Administered 2021-04-21: 5 mL via INTRAVENOUS

## 2021-04-21 MED ORDER — OXYCODONE HCL 15 MG PO TABS: 15.0000 mg | ORAL_TABLET | ORAL | 0 refills | Status: DC | PRN

## 2021-04-21 NOTE — Progress Notes (Signed)
Virtual Visit via Telephone Note  I connected with Doris Johnson on 04/21/21 at 11:30 AM EDT by telephone and verified that I am speaking with the correct person using two identifiers.  Location: Patient: home Provider: home   I discussed the limitations, risks, security and privacy concerns of performing an evaluation and management service by telephone and the availability of in person appointments. I also discussed with the patient that there may be a patient responsible charge related to this service. The patient expressed understanding and agreed to proceed.   History of Present Illness: Doris Johnson is a 70 y.o. female with multiple medical problems including extensive stage small cell lung cancer with liver and brain metastases.  Patient had multiple months of worsening midsternal pain and eventually had CT of the chest with contrast in October 2021 revealing a large mediastinal mass with severe compression of the left main pulmonary artery.  PET/CT revealed extensive disease involving the chest, neck, and liver.  Additionally, patient was found to have small brain mets on MRI.  She is on systemic chemo/immunotherapy. Patient is s/p XRT.  She is referred to palliative care to help address goals and manage ongoing symptoms.   Observations/Objective: Patient reports she is doing reasonably well.  She denies any significant changes or concerns.  Dr. Rogue Bussing discontinued MS Contin as patient was feeling somewhat sleepy.  Patient says that she has since been taking oxycodone about every 4 hours around-the-clock.  Pain is reportedly stable.  Could consider a different long-acting opioid to try to lessen need for frequent oxycodone.  Patient does request refill of oxycodone today.  Assessment and Plan: Extensive stage SCLC - on systemic treatment and followed by Dr. Janese Banks. CTs 5/3 did not show siginicant change in lung mass but some slight interval increase in size of hepatic mets.    Neoplasm related pain -refill oxycodone 15 mg every 4 hours as needed #180  Opioid induced constipation - Miralax/senna  Follow Up Instructions: Follow up My Chart visit in 1 month   I discussed the assessment and treatment plan with the patient. The patient was provided an opportunity to ask questions and all were answered. The patient agreed with the plan and demonstrated an understanding of the instructions.   The patient was advised to call back or seek an in-person evaluation if the symptoms worsen or if the condition fails to improve as anticipated.  I provided 10 minutes of non-face-to-face time during this encounter.   Irean Hong, NP

## 2021-04-22 DIAGNOSIS — C3492 Malignant neoplasm of unspecified part of left bronchus or lung: Secondary | ICD-10-CM | POA: Diagnosis not present

## 2021-04-22 DIAGNOSIS — C3490 Malignant neoplasm of unspecified part of unspecified bronchus or lung: Secondary | ICD-10-CM | POA: Diagnosis not present

## 2021-04-27 DIAGNOSIS — E119 Type 2 diabetes mellitus without complications: Secondary | ICD-10-CM | POA: Diagnosis not present

## 2021-04-27 DIAGNOSIS — C3490 Malignant neoplasm of unspecified part of unspecified bronchus or lung: Secondary | ICD-10-CM | POA: Diagnosis not present

## 2021-04-27 DIAGNOSIS — R197 Diarrhea, unspecified: Secondary | ICD-10-CM | POA: Diagnosis not present

## 2021-04-27 DIAGNOSIS — C787 Secondary malignant neoplasm of liver and intrahepatic bile duct: Secondary | ICD-10-CM | POA: Diagnosis not present

## 2021-04-27 DIAGNOSIS — C7931 Secondary malignant neoplasm of brain: Secondary | ICD-10-CM | POA: Diagnosis not present

## 2021-05-03 ENCOUNTER — Other Ambulatory Visit: Payer: Self-pay | Admitting: *Deleted

## 2021-05-03 DIAGNOSIS — C3490 Malignant neoplasm of unspecified part of unspecified bronchus or lung: Secondary | ICD-10-CM

## 2021-05-04 ENCOUNTER — Encounter: Payer: Self-pay | Admitting: Oncology

## 2021-05-04 ENCOUNTER — Other Ambulatory Visit: Payer: Self-pay

## 2021-05-04 ENCOUNTER — Other Ambulatory Visit: Payer: Self-pay | Admitting: *Deleted

## 2021-05-04 ENCOUNTER — Inpatient Hospital Stay: Payer: Medicare HMO

## 2021-05-04 ENCOUNTER — Inpatient Hospital Stay (HOSPITAL_BASED_OUTPATIENT_CLINIC_OR_DEPARTMENT_OTHER): Payer: Medicare HMO | Admitting: Oncology

## 2021-05-04 VITALS — BP 132/75 | HR 57 | Temp 98.2°F | Resp 16 | Ht 66.0 in | Wt 121.9 lb

## 2021-05-04 DIAGNOSIS — Z79899 Other long term (current) drug therapy: Secondary | ICD-10-CM | POA: Diagnosis not present

## 2021-05-04 DIAGNOSIS — Z803 Family history of malignant neoplasm of breast: Secondary | ICD-10-CM | POA: Diagnosis not present

## 2021-05-04 DIAGNOSIS — R5383 Other fatigue: Secondary | ICD-10-CM | POA: Diagnosis not present

## 2021-05-04 DIAGNOSIS — C787 Secondary malignant neoplasm of liver and intrahepatic bile duct: Secondary | ICD-10-CM

## 2021-05-04 DIAGNOSIS — E049 Nontoxic goiter, unspecified: Secondary | ICD-10-CM | POA: Diagnosis not present

## 2021-05-04 DIAGNOSIS — Z8249 Family history of ischemic heart disease and other diseases of the circulatory system: Secondary | ICD-10-CM | POA: Diagnosis not present

## 2021-05-04 DIAGNOSIS — G893 Neoplasm related pain (acute) (chronic): Secondary | ICD-10-CM

## 2021-05-04 DIAGNOSIS — C3490 Malignant neoplasm of unspecified part of unspecified bronchus or lung: Secondary | ICD-10-CM | POA: Diagnosis not present

## 2021-05-04 DIAGNOSIS — C7931 Secondary malignant neoplasm of brain: Secondary | ICD-10-CM | POA: Diagnosis not present

## 2021-05-04 DIAGNOSIS — R42 Dizziness and giddiness: Secondary | ICD-10-CM | POA: Diagnosis not present

## 2021-05-04 DIAGNOSIS — R0789 Other chest pain: Secondary | ICD-10-CM | POA: Diagnosis not present

## 2021-05-04 DIAGNOSIS — Z8349 Family history of other endocrine, nutritional and metabolic diseases: Secondary | ICD-10-CM | POA: Diagnosis not present

## 2021-05-04 DIAGNOSIS — Z5112 Encounter for antineoplastic immunotherapy: Secondary | ICD-10-CM

## 2021-05-04 DIAGNOSIS — Z87891 Personal history of nicotine dependence: Secondary | ICD-10-CM | POA: Diagnosis not present

## 2021-05-04 DIAGNOSIS — R590 Localized enlarged lymph nodes: Secondary | ICD-10-CM | POA: Diagnosis not present

## 2021-05-04 DIAGNOSIS — C3412 Malignant neoplasm of upper lobe, left bronchus or lung: Secondary | ICD-10-CM | POA: Diagnosis not present

## 2021-05-04 DIAGNOSIS — R519 Headache, unspecified: Secondary | ICD-10-CM | POA: Diagnosis not present

## 2021-05-04 LAB — CBC WITH DIFFERENTIAL/PLATELET
Abs Immature Granulocytes: 0.01 10*3/uL (ref 0.00–0.07)
Basophils Absolute: 0 10*3/uL (ref 0.0–0.1)
Basophils Relative: 0 %
Eosinophils Absolute: 0 10*3/uL (ref 0.0–0.5)
Eosinophils Relative: 0 %
HCT: 32.1 % — ABNORMAL LOW (ref 36.0–46.0)
Hemoglobin: 10.7 g/dL — ABNORMAL LOW (ref 12.0–15.0)
Immature Granulocytes: 0 %
Lymphocytes Relative: 20 %
Lymphs Abs: 0.5 10*3/uL — ABNORMAL LOW (ref 0.7–4.0)
MCH: 29.8 pg (ref 26.0–34.0)
MCHC: 33.3 g/dL (ref 30.0–36.0)
MCV: 89.4 fL (ref 80.0–100.0)
Monocytes Absolute: 0.3 10*3/uL (ref 0.1–1.0)
Monocytes Relative: 11 %
Neutro Abs: 1.7 10*3/uL (ref 1.7–7.7)
Neutrophils Relative %: 69 %
Platelets: 208 10*3/uL (ref 150–400)
RBC: 3.59 MIL/uL — ABNORMAL LOW (ref 3.87–5.11)
RDW: 13.9 % (ref 11.5–15.5)
WBC: 2.6 10*3/uL — ABNORMAL LOW (ref 4.0–10.5)
nRBC: 0 % (ref 0.0–0.2)

## 2021-05-04 LAB — COMPREHENSIVE METABOLIC PANEL
ALT: 13 U/L (ref 0–44)
AST: 22 U/L (ref 15–41)
Albumin: 3.8 g/dL (ref 3.5–5.0)
Alkaline Phosphatase: 49 U/L (ref 38–126)
Anion gap: 6 (ref 5–15)
BUN: 9 mg/dL (ref 8–23)
CO2: 26 mmol/L (ref 22–32)
Calcium: 9.3 mg/dL (ref 8.9–10.3)
Chloride: 100 mmol/L (ref 98–111)
Creatinine, Ser: 0.51 mg/dL (ref 0.44–1.00)
GFR, Estimated: >60 mL/min (ref >60)
Glucose, Bld: 169 mg/dL — ABNORMAL HIGH (ref 70–99)
Potassium: 3.8 mmol/L (ref 3.5–5.1)
Sodium: 132 mmol/L — ABNORMAL LOW (ref 135–145)
Total Bilirubin: 0.7 mg/dL (ref 0.3–1.2)
Total Protein: 6.4 g/dL — ABNORMAL LOW (ref 6.5–8.1)

## 2021-05-04 LAB — TSH: TSH: 0.668 u[IU]/mL (ref 0.350–4.500)

## 2021-05-04 MED ORDER — SODIUM CHLORIDE 0.9 % IV SOLN
Freq: Once | INTRAVENOUS | Status: AC
  Filled 2021-05-04: qty 250

## 2021-05-04 MED ORDER — HEPARIN SOD (PORK) LOCK FLUSH 100 UNIT/ML IV SOLN
500.0000 [IU] | Freq: Once | INTRAVENOUS | Status: AC | PRN
  Administered 2021-05-04: 500 [IU]
  Filled 2021-05-04: qty 5

## 2021-05-04 MED ORDER — MORPHINE SULFATE ER 15 MG PO TBCR: 15.0000 mg | EXTENDED_RELEASE_TABLET | Freq: Two times a day (BID) | ORAL | 0 refills | Status: DC

## 2021-05-04 MED ORDER — SODIUM CHLORIDE 0.9 % IV SOLN
1200.0000 mg | Freq: Once | INTRAVENOUS | Status: AC
  Administered 2021-05-04: 1200 mg via INTRAVENOUS
  Filled 2021-05-04: qty 20

## 2021-05-04 MED ORDER — HEPARIN SOD (PORK) LOCK FLUSH 100 UNIT/ML IV SOLN
INTRAVENOUS | Status: AC
  Filled 2021-05-04: qty 5

## 2021-05-04 NOTE — Patient Instructions (Signed)
[facility]   Discharge Instructions: Thank you for choosing [facility] to provide your oncology and hematology care.  If you have a lab appointment with the [facility], please go directly to the [facility] and check in at the registration area.  Wear comfortable clothing and clothing appropriate for easy access to any Portacath or PICC line.   We strive to give you quality time with your provider. You may need to reschedule your appointment if you arrive late (15 or more minutes).  Arriving late affects you and other patients whose appointments are after yours.  Also, if you miss three or more appointments without notifying the office, you may be dismissed from the clinic at the provider's discretion.      For prescription refill requests, have your pharmacy contact our office and allow 72 hours for refills to be completed.    Today you received the following chemotherapy and/or immunotherapy agents Tecentriq      To help prevent nausea and vomiting after your treatment, we encourage you to take your nausea medication as directed.  BELOW ARE SYMPTOMS THAT SHOULD BE REPORTED IMMEDIATELY: *FEVER GREATER THAN 100.4 F (38 C) OR HIGHER *CHILLS OR SWEATING *NAUSEA AND VOMITING THAT IS NOT CONTROLLED WITH YOUR NAUSEA MEDICATION *UNUSUAL SHORTNESS OF BREATH *UNUSUAL BRUISING OR BLEEDING *URINARY PROBLEMS (pain or burning when urinating, or frequent urination) *BOWEL PROBLEMS (unusual diarrhea, constipation, pain near the anus) TENDERNESS IN MOUTH AND THROAT WITH OR WITHOUT PRESENCE OF ULCERS (sore throat, sores in mouth, or a toothache) UNUSUAL RASH, SWELLING OR PAIN  UNUSUAL VAGINAL DISCHARGE OR ITCHING   Items with * indicate a potential emergency and should be followed up as soon as possible or go to the Emergency Department if any problems should occur.  Please show the CHEMOTHERAPY ALERT CARD or IMMUNOTHERAPY ALERT CARD at check-in  to the Emergency Department and triage nurse.  Should you have questions after your visit or need to cancel or reschedule your appointment, please contact [facility]  [phone] and follow the prompts.  Office hours are 8:00 a.m. to 4:30 p.m. Monday - Friday. Please note that voicemails left after 4:00 p.m. may not be returned until the following business day.  We are closed weekends and major holidays. You have access to a nurse at all times for urgent questions. Please call the main number to the clinic [phone] and follow the prompts.  For any non-urgent questions, you may also contact your provider using MyChart. We now offer e-Visits for anyone 20 and older to request care online for non-urgent symptoms. For details visit [email or web address].   Also download the MyChart app! Go to the app store, search "MyChart", open the app, select [facility], and log in with your MyChart username and password.  Due to Covid, a mask is required upon entering the hospital/clinic. If you do not have a mask, one will be given to you upon arrival. For doctor visits, patients may have 1 support person aged 34 or older with them. For treatment visits, patients cannot have anyone with them due to current Covid guidelines and our immunocompromised population.

## 2021-05-04 NOTE — Progress Notes (Signed)
Nutrition Follow-up:    Patient with small cell lung cancer with liver metastases.  Patient receiving tecentriq.  Met with patient during infusion.  Patient reports that appetite is ok.  Eating peanut butter crackers during visit.  Reports that she is drinking 1-2 ensure original per day.  Mostly eats cereal for breakfast sometimes pancakes.  Lunch maybe boiled chicken, pinto beans, corn.  Says that when she chews foods it feels like it is getting bigger in mouth.   Patient states that she has a sore on her bottom that is painful and does not feel that has healed.  Says that Dr Doris Johnson is aware but forget to get her to look at it today  Medications: reviewed  Labs: reviewed  Anthropometrics:   Weight 121 lb 14.4 oz today  123 lb on 5/31 124 lb on 3/22 133 lb on 3/1 138 lb on 2/8 147 lb on 1/8   NUTRITION DIAGNOSIS: Inadequate oral intake continues with weight loss    INTERVENTION:  Message sent to RN and MD regarding patient's sore on bottom/backside.   Encouraged patient to increase calories and protein to help sore/wound to heal. Continue oral nutrition supplement, does not like plus variety.      MONITORING, EVALUATION, GOAL: weight trends, intake   NEXT VISIT: Tuesday, July 11th during infusion  Doris Johnson, [place], [place] Registered Dietitian [phone] (mobile)

## 2021-05-04 NOTE — Progress Notes (Signed)
Hematology/Oncology Consult note [facility]  Telephone:([phone] [phone]  Patient Care Team: Leonel Ramsay, MD as PCP - General (Infectious Diseases) Telford Nab, RN as Oncology Nurse Navigator   Name of the patient: Doris Johnson  [id number]  October 22, 1951   Date of visit: 05/04/21  Diagnosis- extensive stage small cell lung cancer with liver and brain metastases  Chief complaint/ Reason for visit-on treatment assessment prior to cycle 4 of maintenance Tecentriq  Heme/Onc history: Patient is a 70 year old female with a remote history of smoking in her teenage years and exposure to passive smoking.  She has been having ongoing midsternal pain which has been growing since the last 6 to 7 months.  She had been to urgent care as well in the past.  She finally had a CT chest with contrast done in October 2021 which showed a large mass in the left side of the mediastinum measuring 6.7 x 6.2 x 6.1 cm causing severe compression of the left main pulmonary artery and around the aortic arch in the left subclavian artery.  Enlarged thyroid gland.  Left upper lobe nodule 1 x 0.7 cm.  She then underwent bronchoscopy with Dr.Aleskerov left upper lobe biopsy was nondiagnostic.  However station 4, 7 and 10 L lymph nodes were consistent with metastatic small cell carcinoma.   PET CT scan showed enlarged level 3 cervical lymph node 2.2 cm that was hypermetabolic with an SUV of 9.7.  Large central 7.3 cm AP window mass.  5.7 cm left liver mass.  MRI brain with and without contrast showed 2 small foci of enhancement in the right cerebellar hemisphere and right temporal lobe without mass-effect or edema as well concerning for brain metastases   Patient had 2 cycles of carbo etoposide chemotherapy along with Tecentriq and subsequent scan showed no significant improvement in the primary lung mass or liver mass.  She received radiation treatment to her primary lung mass  and also seen by [facility] for second opinion.  Her pathology was reviewed at [facility] and reported as possible neuroendocrine neoplasm But stated that small cell carcinoma cannot be excluded but not definitely diagnostic for it.  However [facility] pathology feels that this is consistent with small cell lung cancer.  She has completed 4 cycles of carbo etoposide Tecentriq chemotherapy and is currently on maintenance Tecentriq.  Repeat liver biopsy was also performed and was consistent with small cell lung cancer    Interval history-reports that her chest wall pain is somewhat worse over the last couple of weeks.  She is using oxycodone every 4 hours but is not controlling pain enough.  Reports occasional dizzy spells.  She also had a repeat MRI brain which did not show any worsening brain metastases.  Bowel movements are soft but no frank diarrhea.  She has a bowel movement about every other day  ECOG PS- 1 Pain scale- 4 Opioid associated constipation- no  Review of systems- Review of Systems  Constitutional:  Positive for malaise/fatigue. Negative for chills, fever and weight loss.  HENT:  Negative for congestion, ear discharge and nosebleeds.   Eyes:  Negative for blurred vision.  Respiratory:  Negative for cough, hemoptysis, sputum production, shortness of breath and wheezing.        Chest wall pain  Cardiovascular:  Negative for chest pain, palpitations, orthopnea and claudication.  Gastrointestinal:  Negative for abdominal pain, blood in stool, constipation, diarrhea, heartburn, melena, nausea and vomiting.  Genitourinary:  Negative for dysuria, flank  pain, frequency, hematuria and urgency.  Musculoskeletal:  Negative for back pain, joint pain and myalgias.  Skin:  Negative for rash.  Neurological:  Negative for dizziness, tingling, focal weakness, seizures, weakness and headaches.  Endo/Heme/Allergies:  Does not bruise/bleed easily.  Psychiatric/Behavioral:  Negative for depression and suicidal ideas.  The patient does not have insomnia.       No Known Allergies   Past Medical History:  Diagnosis Date   Cancer ([place])    Cataract    Diabetes mellitus without complication ([place])    Hyperlipidemia    Hypertension    Hypothyroidism    doctor's keeping an eye on thyroid      Past Surgical History:  Procedure Laterality Date   ABDOMINAL HYSTERECTOMY     PORTA CATH INSERTION N/A 11/30/2020   Procedure: PORTA CATH INSERTION;  Surgeon: Algernon Huxley, MD;  Location: [place] [facility];  Service: Cardiovascular;  Laterality: N/A;   VIDEO BRONCHOSCOPY WITH ENDOBRONCHIAL NAVIGATION N/A 10/21/2020   Procedure: VIDEO BRONCHOSCOPY WITH ENDOBRONCHIAL NAVIGATION;  Surgeon: Ottie Glazier, MD;  Location: [facility] ORS;  Service: Thoracic;  Laterality: N/A;   VIDEO BRONCHOSCOPY WITH ENDOBRONCHIAL ULTRASOUND N/A 10/21/2020   Procedure: VIDEO BRONCHOSCOPY WITH ENDOBRONCHIAL ULTRASOUND;  Surgeon: Ottie Glazier, MD;  Location: [facility] OR[facility];  Service: Thoracic;  Laterality: N/A;    Social History   Socioeconomic History   Marital status: Single    Spouse name: Not on file   Number of children: Not on file   Years of education: Not on file   Highest education level: Not on file  Occupational History   Not on file  Tobacco Use   Smoking status: Never   Smokeless tobacco: Never  Vaping Use   Vaping Use: Never used  Substance and Sexual Activity   Alcohol use: No   Drug use: No   Sexual activity: Not Currently  Other Topics Concern   Not on file  Social History Narrative   Not on file   Social Determinants of Health   Financial Resource Strain: Not on file  Food Insecurity: Not on file  Transportation Needs: Not on file  Physical Activity: Not on file  Stress: Not on file  Social Connections: Not on file  Intimate Partner Violence: Not on file    Family History  Problem Relation Age of Onset   Cancer Mother        breast   Hypertension Mother    Breast cancer Mother 71   Heart  disease Father    Hyperlipidemia Brother    Heart disease Brother      Current Outpatient Medications:    docusate sodium (COLACE) 100 MG capsule, Take 200 mg by mouth at bedtime., Disp: , Rfl:    fluticasone (FLONASE) 50 MCG/ACT nasal spray, Place 2 sprays into both nostrils daily., Disp: 16 g, Rfl: 5   lidocaine-prilocaine (EMLA) cream, Apply 1 application topically as needed. Apply small amount to port site at least 1 hour prior to it being accessed, cover with plastic wrap, Disp: 30 g, Rfl: 1   oxyCODONE (ROXICODONE) 15 MG immediate release tablet, Take 1 tablet (15 mg total) by mouth every 4 (four) hours as needed for pain., Disp: 180 tablet, Rfl: 0   potassium chloride SA (KLOR-CON) 20 MEQ tablet, Take 2 tablets (40 mEq total) by mouth daily., Disp: 7 tablet, Rfl: 0   LORazepam (ATIVAN) 0.5 MG tablet, Take 1 tablet (0.5 mg total) by mouth every 6 (six) hours as needed (Nausea or  vomiting). (Patient not taking: Reported on 05/04/2021), Disp: 30 tablet, Rfl: 0   morphine (MS CONTIN) 15 MG 12 hr tablet, Take 1 tablet (15 mg total) by mouth every 12 (twelve) hours., Disp: 60 tablet, Rfl: 0   ondansetron (ZOFRAN) 8 MG tablet, Take 1 tablet (8 mg total) by mouth 2 (two) times daily as needed for refractory nausea / vomiting. Start on day 3 after carboplatin chemo. (Patient not taking: Reported on 05/04/2021), Disp: 30 tablet, Rfl: 1   prochlorperazine (COMPAZINE) 10 MG tablet, Take 1 tablet (10 mg total) by mouth every 6 (six) hours as needed (Nausea or vomiting). (Patient not taking: Reported on 05/04/2021), Disp: 30 tablet, Rfl: 1 No current facility-administered medications for this visit.  Facility-Administered Medications Ordered in Other Visits:    sodium chloride flush (NS) 0.9 % injection 10 mL, 10 mL, Intravenous, PRN, Sindy Guadeloupe, MD, 10 mL at 12/15/20 0850  Physical exam:  Vitals:   05/04/21 1120  BP: 132/75  Pulse: (!) 57  Resp: 16  Temp: 98.2 F (36.8 C)  TempSrc: Tympanic   Weight: 121 lb 14.4 oz (55.3 kg)  Height: 5\' 6"  (1.676 m)   Physical Exam Constitutional:      General: She is not in acute distress. Cardiovascular:     Rate and Rhythm: Normal rate and regular rhythm.     Heart sounds: Normal heart sounds.  Pulmonary:     Effort: Pulmonary effort is normal.     Breath sounds: Normal breath sounds.  Abdominal:     General: Bowel sounds are normal.     Palpations: Abdomen is soft.  Skin:    General: Skin is warm and dry.  Neurological:     Mental Status: She is alert and oriented to person, place, and time.     CMP Latest Ref Rng & Units 05/04/2021  Glucose 70 - 99 mg/dL 169(H)  BUN 8 - 23 mg/dL 9  Creatinine 0.44 - 1.00 mg/dL 0.51  Sodium 135 - 145 mmol/L 132(L)  Potassium 3.5 - 5.1 mmol/L 3.8  Chloride 98 - 111 mmol/L 100  CO2 22 - 32 mmol/L 26  Calcium 8.9 - 10.3 mg/dL 9.3  Total Protein 6.5 - 8.1 g/dL 6.4(L)  Total Bilirubin 0.3 - 1.2 mg/dL 0.7  Alkaline Phos 38 - 126 U/L 49  AST 15 - 41 U/L 22  ALT 0 - 44 U/L 13   CBC Latest Ref Rng & Units 05/04/2021  WBC 4.0 - 10.5 K/uL 2.6(L)  Hemoglobin 12.0 - 15.0 g/dL 10.7(L)  Hematocrit 36.0 - 46.0 % 32.1(L)  Platelets 150 - 400 K/uL 208    No images are attached to the encounter.  MR BRAIN W WO CONTRAST  Result Date: 04/22/2021 CLINICAL DATA:  History of lung cancer. Daily headaches. Dizziness. EXAM: MRI HEAD WITHOUT AND WITH CONTRAST TECHNIQUE: Multiplanar, multiecho pulse sequences of the brain and surrounding structures were obtained without and with intravenous contrast. CONTRAST:  41mL GADAVIST GADOBUTROL 1 MMOL/ML IV SOLN COMPARISON:  MRI Mar 22, 2021. FINDINGS: Brain: Unchanged size/appearance of the 2 small T1 hyperintense foci in the right cerebellum and right posterolateral temporal lobe. Additional punctate focus of T1 hyperintensity in the parasagittal parietal lobe (series 9, image 10; series 18, image 113; series 20, image 10) which is similar to the prior. As before, these areas  demonstrate intrinsic T1 hyperintensity, which makes assessment for enhancement difficult. Curvilinear area of enhancement in the lateral high right parietal lobe at the level of the parasagittal  lesion described above most likely represents a vessel and is similar to the priors. No acute infarct, acute hemorrhage, hydrocephalus, or extra-axial fluid collection. Vascular: Major arterial flow voids are maintained at the skull base. Skull and upper cervical spine: Normal marrow signal. Sinuses/Orbits: Clear sinuses.  Unremarkable orbits. IMPRESSION: 1. Unchanged size/appearance of the two small intrinsically T1 hyperintense nodules in the right cerebellum and right posterolateral temporal lobe. Additional punctate focus of T1 hyperintensity in the parasagittal right parietal lobe is also similar to the priors in retrospect. Recommend continued imaging surveillance. 2. Curvilinear area of enhancement in the lateral high right parietal lobe at the level of the parasagittal lesion described above most likely represents a vessel and is similar to the priors, but also warrants attention on follow-up. Electronically Signed   By: Margaretha Sheffield MD   On: 04/22/2021 08:45     Assessment and plan- Patient is a 70 y.o. female with extensive stage small cell lung cancer here for on treatment assessment prior to cycle 4 of maintenance Tecentriq  Counts okay to proceed with cycle 4 of maintenance Tecentriq today.  I will see her back in 3 weeks for cycle 5.  Plan to repeat scans after 6 cycles.  Most recent CT scan showed stable but significant size of primary mediastinal mass.  Mild increase in the size of liver metastases.  Plan is to continue Tecentriq until frank progression or toxicity.  Patient was also seen by Dr. Durenda Hurt at [facility] recently and was given the option of participation in clinical trial which she did not wish to pursue at this time.  TSH from today is normal and patient is otherwise tolerating her  treatment well  Neoplasm related pain: Oxycodone does not seem to be helping her pain enough.  We will consider restarting morphine long-acting but if she complains of worsening insomnia I will switch her to an alternative medication.   Visit Diagnosis 1. Encounter for antineoplastic immunotherapy   2. Small cell carcinoma of lung metastatic to liver ([place])   3. Neoplasm related pain      Dr. Randa Evens, MD, MPH [facility] at [facility] [phone] 05/04/2021 3:09 PM

## 2021-05-04 NOTE — Progress Notes (Signed)
Pt states that her back and chest hurts and meds help. Had morphine for lon acting but she did not take it much because she stayed sleepy on both long acting and short acting. She is eating 3 meals a day.

## 2021-05-25 ENCOUNTER — Inpatient Hospital Stay: Payer: Medicare HMO | Attending: Oncology

## 2021-05-25 ENCOUNTER — Other Ambulatory Visit: Payer: Self-pay | Admitting: *Deleted

## 2021-05-25 ENCOUNTER — Other Ambulatory Visit: Payer: Self-pay

## 2021-05-25 ENCOUNTER — Encounter: Payer: Self-pay | Admitting: Oncology

## 2021-05-25 ENCOUNTER — Inpatient Hospital Stay: Payer: Medicare HMO

## 2021-05-25 ENCOUNTER — Inpatient Hospital Stay (HOSPITAL_BASED_OUTPATIENT_CLINIC_OR_DEPARTMENT_OTHER): Payer: Medicare HMO | Admitting: Oncology

## 2021-05-25 VITALS — BP 128/78 | HR 60 | Temp 98.6°F | Resp 16 | Ht 66.0 in | Wt 118.0 lb

## 2021-05-25 DIAGNOSIS — K5900 Constipation, unspecified: Secondary | ICD-10-CM | POA: Insufficient documentation

## 2021-05-25 DIAGNOSIS — Z5112 Encounter for antineoplastic immunotherapy: Secondary | ICD-10-CM

## 2021-05-25 DIAGNOSIS — R590 Localized enlarged lymph nodes: Secondary | ICD-10-CM | POA: Diagnosis not present

## 2021-05-25 DIAGNOSIS — C3490 Malignant neoplasm of unspecified part of unspecified bronchus or lung: Secondary | ICD-10-CM

## 2021-05-25 DIAGNOSIS — Z95828 Presence of other vascular implants and grafts: Secondary | ICD-10-CM

## 2021-05-25 DIAGNOSIS — Z79899 Other long term (current) drug therapy: Secondary | ICD-10-CM | POA: Diagnosis not present

## 2021-05-25 DIAGNOSIS — G893 Neoplasm related pain (acute) (chronic): Secondary | ICD-10-CM | POA: Insufficient documentation

## 2021-05-25 DIAGNOSIS — R197 Diarrhea, unspecified: Secondary | ICD-10-CM | POA: Insufficient documentation

## 2021-05-25 DIAGNOSIS — Z803 Family history of malignant neoplasm of breast: Secondary | ICD-10-CM | POA: Insufficient documentation

## 2021-05-25 DIAGNOSIS — C787 Secondary malignant neoplasm of liver and intrahepatic bile duct: Secondary | ICD-10-CM

## 2021-05-25 DIAGNOSIS — Z87891 Personal history of nicotine dependence: Secondary | ICD-10-CM | POA: Diagnosis not present

## 2021-05-25 DIAGNOSIS — R400 Somnolence: Secondary | ICD-10-CM | POA: Diagnosis not present

## 2021-05-25 DIAGNOSIS — R5383 Other fatigue: Secondary | ICD-10-CM | POA: Diagnosis not present

## 2021-05-25 DIAGNOSIS — E049 Nontoxic goiter, unspecified: Secondary | ICD-10-CM | POA: Diagnosis not present

## 2021-05-25 DIAGNOSIS — Z8249 Family history of ischemic heart disease and other diseases of the circulatory system: Secondary | ICD-10-CM | POA: Insufficient documentation

## 2021-05-25 DIAGNOSIS — Z8349 Family history of other endocrine, nutritional and metabolic diseases: Secondary | ICD-10-CM | POA: Diagnosis not present

## 2021-05-25 DIAGNOSIS — C7931 Secondary malignant neoplasm of brain: Secondary | ICD-10-CM | POA: Diagnosis not present

## 2021-05-25 LAB — COMPREHENSIVE METABOLIC PANEL
ALT: 22 U/L (ref 0–44)
AST: 29 U/L (ref 15–41)
Albumin: 3.8 g/dL (ref 3.5–5.0)
Alkaline Phosphatase: 46 U/L (ref 38–126)
Anion gap: 5 (ref 5–15)
BUN: 11 mg/dL (ref 8–23)
CO2: 28 mmol/L (ref 22–32)
Calcium: 9.3 mg/dL (ref 8.9–10.3)
Chloride: 103 mmol/L (ref 98–111)
Creatinine, Ser: 0.46 mg/dL (ref 0.44–1.00)
GFR, Estimated: >60 mL/min (ref >60)
Glucose, Bld: 90 mg/dL (ref 70–99)
Potassium: 3.8 mmol/L (ref 3.5–5.1)
Sodium: 136 mmol/L (ref 135–145)
Total Bilirubin: 0.5 mg/dL (ref 0.3–1.2)
Total Protein: 6.6 g/dL (ref 6.5–8.1)

## 2021-05-25 LAB — CBC WITH DIFFERENTIAL/PLATELET
Abs Immature Granulocytes: 0 10*3/uL (ref 0.00–0.07)
Basophils Absolute: 0 10*3/uL (ref 0.0–0.1)
Basophils Relative: 0 %
Eosinophils Absolute: 0 10*3/uL (ref 0.0–0.5)
Eosinophils Relative: 1 %
HCT: 34.1 % — ABNORMAL LOW (ref 36.0–46.0)
Hemoglobin: 11.1 g/dL — ABNORMAL LOW (ref 12.0–15.0)
Immature Granulocytes: 0 %
Lymphocytes Relative: 21 %
Lymphs Abs: 0.5 10*3/uL — ABNORMAL LOW (ref 0.7–4.0)
MCH: 28.9 pg (ref 26.0–34.0)
MCHC: 32.6 g/dL (ref 30.0–36.0)
MCV: 88.8 fL (ref 80.0–100.0)
Monocytes Absolute: 0.4 10*3/uL (ref 0.1–1.0)
Monocytes Relative: 14 %
Neutro Abs: 1.6 10*3/uL — ABNORMAL LOW (ref 1.7–7.7)
Neutrophils Relative %: 64 %
Platelets: 220 10*3/uL (ref 150–400)
RBC: 3.84 MIL/uL — ABNORMAL LOW (ref 3.87–5.11)
RDW: 14.7 % (ref 11.5–15.5)
WBC: 2.5 10*3/uL — ABNORMAL LOW (ref 4.0–10.5)
nRBC: 0 % (ref 0.0–0.2)

## 2021-05-25 MED ORDER — SODIUM CHLORIDE 0.9 % IV SOLN
Freq: Once | INTRAVENOUS | Status: AC
Start: 2021-05-25 — End: 2021-05-25
  Filled 2021-05-25: qty 250

## 2021-05-25 MED ORDER — OXYCODONE HCL 30 MG PO TABS: 30.0000 mg | ORAL_TABLET | ORAL | 0 refills | Status: DC | PRN

## 2021-05-25 MED ORDER — HEPARIN SOD (PORK) LOCK FLUSH 100 UNIT/ML IV SOLN
INTRAVENOUS | Status: AC
  Filled 2021-05-25: qty 5

## 2021-05-25 MED ORDER — HEPARIN SOD (PORK) LOCK FLUSH 100 UNIT/ML IV SOLN
500.0000 [IU] | Freq: Once | INTRAVENOUS | Status: AC
  Administered 2021-05-25: 500 [IU] via INTRAVENOUS
  Filled 2021-05-25: qty 5

## 2021-05-25 MED ORDER — SODIUM CHLORIDE 0.9 % IV SOLN
1200.0000 mg | Freq: Once | INTRAVENOUS | Status: AC
  Administered 2021-05-25: 1200 mg via INTRAVENOUS
  Filled 2021-05-25: qty 20

## 2021-05-25 NOTE — Progress Notes (Signed)
Hematology/Oncology Consult note [facility]  Telephone:([phone] [phone]  Patient Care Team: Leonel Ramsay, MD as PCP - General (Infectious Diseases) Telford Nab, RN as Oncology Nurse Navigator   Name of the patient: Doris Johnson  [id number]  16-Mar-1951   Date of visit: 05/25/21  Diagnosis- extensive stage small cell lung cancer with liver and brain metastases  Chief complaint/ Reason for visit-on treatment assessment prior to cycle 5 of maintenance Tecentriq  Heme/Onc history:  Patient is a 70 year old female with a remote history of smoking in her teenage years and exposure to passive smoking.  She has been having ongoing midsternal pain which has been growing since the last 6 to 7 months.  She had been to urgent care as well in the past.  She finally had a CT chest with contrast done in October 2021 which showed a large mass in the left side of the mediastinum measuring 6.7 x 6.2 x 6.1 cm causing severe compression of the left main pulmonary artery and around the aortic arch in the left subclavian artery.  Enlarged thyroid gland.  Left upper lobe nodule 1 x 0.7 cm.  She then underwent bronchoscopy with Dr.Aleskerov left upper lobe biopsy was nondiagnostic.  However station 4, 7 and 10 L lymph nodes were consistent with metastatic small cell carcinoma.   PET CT scan showed enlarged level 3 cervical lymph node 2.2 cm that was hypermetabolic with an SUV of 9.7.  Large central 7.3 cm AP window mass.  5.7 cm left liver mass.  MRI brain with and without contrast showed 2 small foci of enhancement in the right cerebellar hemisphere and right temporal lobe without mass-effect or edema as well concerning for brain metastases   Patient had 2 cycles of carbo etoposide chemotherapy along with Tecentriq and subsequent scan showed no significant improvement in the primary lung mass or liver mass.  She received radiation treatment to her primary lung  mass and also seen by [facility] for second opinion.  Her pathology was reviewed at [facility] and reported as possible neuroendocrine neoplasm But stated that small cell carcinoma cannot be excluded but not definitely diagnostic for it.  However [facility] pathology feels that this is consistent with small cell lung cancer.  She has completed 4 cycles of carbo etoposide Tecentriq chemotherapy and is currently on maintenance Tecentriq.  Repeat liver biopsy was also performed and was consistent with small cell lung cancer  Interval history-continues to have intermittent chest wall pain which is not well controlled with as needed oxycodone 15 mg every 4 hours.  She states that she uses long-acting morphine but it makes her feel drowsy.  Bowel movements alternate between constipation and sometimes diarrhea but she is having regular bowel movements  ECOG PS- 1 Pain scale- 0   Review of systems- Review of Systems  Constitutional:  Positive for malaise/fatigue and weight loss. Negative for chills and fever.  HENT:  Negative for congestion, ear discharge and nosebleeds.   Eyes:  Negative for blurred vision.  Respiratory:  Negative for cough, hemoptysis, sputum production, shortness of breath and wheezing.        Chest wall pain  Cardiovascular:  Negative for chest pain, palpitations, orthopnea and claudication.  Gastrointestinal:  Negative for abdominal pain, blood in stool, constipation, diarrhea, heartburn, melena, nausea and vomiting.  Genitourinary:  Negative for dysuria, flank pain, frequency, hematuria and urgency.  Musculoskeletal:  Negative for back pain, joint pain and myalgias.  Skin:  Negative for  rash.  Neurological:  Negative for dizziness, tingling, focal weakness, seizures, weakness and headaches.  Endo/Heme/Allergies:  Does not bruise/bleed easily.  Psychiatric/Behavioral:  Negative for depression and suicidal ideas. The patient does not have insomnia.      No Known Allergies   Past Medical  History:  Diagnosis Date   Cancer ([place])    Cataract    Diabetes mellitus without complication ([place])    Hyperlipidemia    Hypertension    Hypothyroidism    doctor's keeping an eye on thyroid      Past Surgical History:  Procedure Laterality Date   ABDOMINAL HYSTERECTOMY     PORTA CATH INSERTION N/A 11/30/2020   Procedure: PORTA CATH INSERTION;  Surgeon: Algernon Huxley, MD;  Location: [place] [facility];  Service: Cardiovascular;  Laterality: N/A;   VIDEO BRONCHOSCOPY WITH ENDOBRONCHIAL NAVIGATION N/A 10/21/2020   Procedure: VIDEO BRONCHOSCOPY WITH ENDOBRONCHIAL NAVIGATION;  Surgeon: Ottie Glazier, MD;  Location: [facility] ORS;  Service: Thoracic;  Laterality: N/A;   VIDEO BRONCHOSCOPY WITH ENDOBRONCHIAL ULTRASOUND N/A 10/21/2020   Procedure: VIDEO BRONCHOSCOPY WITH ENDOBRONCHIAL ULTRASOUND;  Surgeon: Ottie Glazier, MD;  Location: [facility] ORS;  Service: Thoracic;  Laterality: N/A;    Social History   Socioeconomic History   Marital status: Single    Spouse name: Not on file   Number of children: Not on file   Years of education: Not on file   Highest education level: Not on file  Occupational History   Not on file  Tobacco Use   Smoking status: Never   Smokeless tobacco: Never  Vaping Use   Vaping Use: Never used  Substance and Sexual Activity   Alcohol use: No   Drug use: No   Sexual activity: Not Currently  Other Topics Concern   Not on file  Social History Narrative   Not on file   Social Determinants of Health   Financial Resource Strain: Not on file  Food Insecurity: Not on file  Transportation Needs: Not on file  Physical Activity: Not on file  Stress: Not on file  Social Connections: Not on file  Intimate Partner Violence: Not on file    Family History  Problem Relation Age of Onset   Cancer Mother        breast   Hypertension Mother    Breast cancer Mother 6   Heart disease Father    Hyperlipidemia Brother    Heart disease Brother      Current  Outpatient Medications:    docusate sodium (COLACE) 100 MG capsule, Take 200 mg by mouth at bedtime., Disp: , Rfl:    fluticasone (FLONASE) 50 MCG/ACT nasal spray, Place 2 sprays into both nostrils daily., Disp: 16 g, Rfl: 5   lidocaine-prilocaine (EMLA) cream, Apply 1 application topically as needed. Apply small amount to port site at least 1 hour prior to it being accessed, cover with plastic wrap, Disp: 30 g, Rfl: 1   mirtazapine (REMERON) 15 MG tablet, Take 15 mg by mouth at bedtime., Disp: , Rfl:    morphine (MS CONTIN) 15 MG 12 hr tablet, Take 1 tablet (15 mg total) by mouth every 12 (twelve) hours., Disp: 60 tablet, Rfl: 0   polyethylene glycol (MIRALAX / GLYCOLAX) 17 g packet, Take 17 g by mouth daily., Disp: , Rfl:    potassium chloride SA (KLOR-CON) 20 MEQ tablet, Take 2 tablets (40 mEq total) by mouth daily., Disp: 7 tablet, Rfl: 0   LORazepam (ATIVAN) 0.5 MG tablet, Take 1 tablet (  0.5 mg total) by mouth every 6 (six) hours as needed (Nausea or vomiting). (Patient not taking: No sig reported), Disp: 30 tablet, Rfl: 0   ondansetron (ZOFRAN) 8 MG tablet, Take 1 tablet (8 mg total) by mouth 2 (two) times daily as needed for refractory nausea / vomiting. Start on day 3 after carboplatin chemo. (Patient not taking: No sig reported), Disp: 30 tablet, Rfl: 1   oxycodone (ROXICODONE) 30 MG immediate release tablet, Take 1 tablet (30 mg total) by mouth every 4 (four) hours as needed for pain., Disp: 180 tablet, Rfl: 0   prochlorperazine (COMPAZINE) 10 MG tablet, Take 1 tablet (10 mg total) by mouth every 6 (six) hours as needed (Nausea or vomiting). (Patient not taking: No sig reported), Disp: 30 tablet, Rfl: 1 No current facility-administered medications for this visit.  Facility-Administered Medications Ordered in Other Visits:    sodium chloride flush (NS) 0.9 % injection 10 mL, 10 mL, Intravenous, PRN, Sindy Guadeloupe, MD, 10 mL at 12/15/20 0850  Physical exam:  Vitals:   05/25/21 1017  BP:  128/78  Pulse: 60  Resp: 16  Temp: 98.6 F (37 C)  TempSrc: Oral  Weight: 118 lb (53.5 kg)  Height: 5\' 6"  (1.676 m)   Physical Exam Constitutional:      Comments: Patient is thin and cachectic.  Appears in no acute distress  Cardiovascular:     Rate and Rhythm: Normal rate and regular rhythm.     Heart sounds: Normal heart sounds.  Pulmonary:     Effort: Pulmonary effort is normal.     Breath sounds: Normal breath sounds.  Abdominal:     General: Bowel sounds are normal.     Palpations: Abdomen is soft.  Musculoskeletal:     Cervical back: Normal range of motion.  Skin:    General: Skin is warm and dry.  Neurological:     Mental Status: She is alert and oriented to person, place, and time.     CMP Latest Ref Rng & Units 05/25/2021  Glucose 70 - 99 mg/dL 90  BUN 8 - 23 mg/dL 11  Creatinine 0.44 - 1.00 mg/dL 0.46  Sodium 135 - 145 mmol/L 136  Potassium 3.5 - 5.1 mmol/L 3.8  Chloride 98 - 111 mmol/L 103  CO2 22 - 32 mmol/L 28  Calcium 8.9 - 10.3 mg/dL 9.3  Total Protein 6.5 - 8.1 g/dL 6.6  Total Bilirubin 0.3 - 1.2 mg/dL 0.5  Alkaline Phos 38 - 126 U/L 46  AST 15 - 41 U/L 29  ALT 0 - 44 U/L 22   CBC Latest Ref Rng & Units 05/25/2021  WBC 4.0 - 10.5 K/uL 2.5(L)  Hemoglobin 12.0 - 15.0 g/dL 11.1(L)  Hematocrit 36.0 - 46.0 % 34.1(L)  Platelets 150 - 400 K/uL 220     Assessment and plan- Patient is a 70 y.o. female with extensive stage small cell lung cancer here for on treatment assessment prior to cycle 5 of maintenance Tecentriq  Counts okay to proceed with cycle 5 of maintenance Tecentriq today and I will see her back in 3 weeks for cycle 6.  TSH is normal.  We will repeat scans after 6 cycles.  If scans show continued progression she is willing to consider clinical trial at [facility].  Her overall prognosis is guarded and given that she did not have significant response to frontline carbo etoposide Tecentriq chemotherapy  Neoplasm related pain: Patient is not telling me  clearly if she is taking her  long-acting morphine but does not want to go up on the dose of morphine as she fears somnolence.  We will therefore increase the dose of oxycodone 30 mg every 4 hours as needed.   Visit Diagnosis 1. Small cell carcinoma of lung metastatic to liver ([place])   2. Port-A-Cath in place   3. Encounter for antineoplastic immunotherapy      Dr. Randa Evens, MD, MPH [facility] at [facility] [phone] 05/25/2021 9:46 PM

## 2021-05-25 NOTE — Progress Notes (Signed)
Nutrition Follow-up:    Patient with small cell lung cancer with metastatic disease.  Patient receiving tecentriq.  Met with patient during infusion.  Says that her appetite dropped off after last treatment but has started coming back.  Says that Dr Ola Spurr prescribed remeron and she has been taking it a little less than 2 weeks and feels it is helping.  Says that she ate cereal with whole milk for breakfast yesterday.  Lunch was green beans, corn, baked chicken.  Supper was beef stew with vegetables. Drinking about 1-2 ensure original shakes per day. Does not like the plus shakes.     Medications: reviewed  Labs: reviewed  Anthropometrics:   Weight 118 lb decreased today  121 lb 14.4 oz on 6/21  123 lb on 5/31 124 lb on 3/22 133 lb on 3/1 138 lb on 2/8 147 lb on 1/8   NUTRITION DIAGNOSIS: Inadequate oral intake continues   INTERVENTION:  Encouraged ensure shakes as able Reviewed ways to add calories and protein in diet to prevent weight loss Encouraged patient to continue taking remeron    MONITORING, EVALUATION, GOAL: weight trends, intake   NEXT VISIT: Tuesday, Aug 2nd during infusion  Kylan Veach B. Zenia Resides, [place], [place] Registered Dietitian [phone] (mobile)

## 2021-05-25 NOTE — Progress Notes (Signed)
[place] area has some pain at times, and sometimes has HA but she has allergies. She not eating as good as she needs. She starts to eat ans it is good and then starts not tasting good. Drinks 1 ensure most days, asked her to drink 2-3 and she thinks she can only do 2 a day

## 2021-05-25 NOTE — Patient Instructions (Signed)
[facility]COL[facility]  Discharge Instructions: Thank you for choosing [facility] to provide your oncology and hematology care.  If you have a lab appointment with the [facility], please go directly to the [facility] and check in at the registration area.  Wear comfortable clothing and clothing appropriate for easy access to any Portacath or PICC line.   We strive to give you quality time with your provider. You may need to reschedule your appointment if you arrive late (15 or more minutes).  Arriving late affects you and other patients whose appointments are after yours.  Also, if you miss three or more appointments without notifying the office, you may be dismissed from the clinic at the provider's discretion.      For prescription refill requests, have your pharmacy contact our office and allow 72 hours for refills to be completed.    Today you received the following chemotherapy and/or immunotherapy agents TECENTRIQ      To help prevent nausea and vomiting after your treatment, we encourage you to take your nausea medication as directed.  BELOW ARE SYMPTOMS THAT SHOULD BE REPORTED IMMEDIATELY: *FEVER GREATER THAN 100.4 F (38 C) OR HIGHER *CHILLS OR SWEATING *NAUSEA AND VOMITING THAT IS NOT CONTROLLED WITH YOUR NAUSEA MEDICATION *UNUSUAL SHORTNESS OF BREATH *UNUSUAL BRUISING OR BLEEDING *URINARY PROBLEMS (pain or burning when urinating, or frequent urination) *BOWEL PROBLEMS (unusual diarrhea, constipation, pain near the anus) TENDERNESS IN MOUTH AND THROAT WITH OR WITHOUT PRESENCE OF ULCERS (sore throat, sores in mouth, or a toothache) UNUSUAL RASH, SWELLING OR PAIN  UNUSUAL VAGINAL DISCHARGE OR ITCHING   Items with * indicate a potential emergency and should be followed up as soon as possible or go to the Emergency Department if any problems should occur.  Please show the CHEMOTHERAPY ALERT CARD or IMMUNOTHERAPY ALERT CARD at check-in  to the Emergency Department and triage nurse.  Should you have questions after your visit or need to cancel or reschedule your appointment, please contact [facility]  [phone] and follow the prompts.  Office hours are 8:00 a.m. to 4:30 p.m. Monday - Friday. Please note that voicemails left after 4:00 p.m. may not be returned until the following business day.  We are closed weekends and major holidays. You have access to a nurse at all times for urgent questions. Please call the main number to the clinic [phone] and follow the prompts.  For any non-urgent questions, you may also contact your provider using MyChart. We now offer e-Visits for anyone 75 and older to request care online for non-urgent symptoms. For details visit [email or web address].   Also download the MyChart app! Go to the app store, search "MyChart", open the app, select [facility], and log in with your MyChart username and password.  Due to Covid, a mask is required upon entering the hospital/clinic. If you do not have a mask, one will be given to you upon arrival. For doctor visits, patients may have 1 support person aged 95 or older with them. For treatment visits, patients cannot have anyone with them due to current Covid guidelines and our immunocompromised population.   Atezolizumab injection What is this medication? ATEZOLIZUMAB (a te zoe LIZ ue mab) is a monoclonal antibody. It is used to treat bladder cancer (urothelial cancer), liver cancer, lung cancer, andmelanoma. This medicine may be used for other purposes; ask your health care provider orpharmacist if you have questions. COMMON BRAND NAME(S): Tecentriq What should  I tell my care team before I take this medication? They need to know if you have any of these conditions: autoimmune diseases like Crohn's disease, ulcerative colitis, or lupus have had or planning to have an allogeneic stem cell transplant (uses someone  else's stem cells) history of organ transplant history of radiation to the chest nervous system problems like myasthenia gravis or Guillain-Barre syndrome an unusual or allergic reaction to atezolizumab, other medicines, foods, dyes, or preservatives pregnant or trying to get pregnant breast-feeding How should I use this medication? This medicine is for infusion into a vein. It is given by a health careprofessional in a hospital or clinic setting. A special MedGuide will be given to you before each treatment. Be sure to readthis information carefully each time. Talk to your pediatrician regarding the use of this medicine in children.Special care may be needed. Overdosage: If you think you have taken too much of this medicine contact apoison control center or emergency room at once. NOTE: This medicine is only for you. Do not share this medicine with others. What if I miss a dose? It is important not to miss your dose. Call your doctor or health careprofessional if you are unable to keep an appointment. What may interact with this medication? Interactions have not been studied. This list may not describe all possible interactions. Give your health care provider a list of all the medicines, herbs, non-prescription drugs, or dietary supplements you use. Also tell them if you smoke, drink alcohol, or use illegaldrugs. Some items may interact with your medicine. What should I watch for while using this medication? Your condition will be monitored carefully while you are receiving thismedicine. You may need blood work done while you are taking this medicine. Do not become pregnant while taking this medicine or for at least 5 months after stopping it. Women should inform their doctor if they wish to become pregnant or think they might be pregnant. There is a potential for serious side effects to an unborn child. Talk to your health care professional or pharmacist for more information. Do not  breast-feed an infant while taking this medicineor for at least 5 months after the last dose. What side effects may I notice from receiving this medication? Side effects that you should report to your doctor or health care professionalas soon as possible: allergic reactions like skin rash, itching or hives, swelling of the face, lips, or tongue black, tarry stools bloody or watery diarrhea breathing problems changes in vision chest pain or chest tightness chills facial flushing fever headache signs and symptoms of high blood sugar such as dizziness; dry mouth; dry skin; fruity breath; nausea; stomach pain; increased hunger or thirst; increased urination signs and symptoms of liver injury like dark yellow or brown urine; general ill feeling or flu-like symptoms; light-colored stools; loss of appetite; nausea; right upper belly pain; unusually weak or tired; yellowing of the eyes or skin stomach pain trouble passing urine or change in the amount of urine Side effects that usually do not require medical attention (report to yourdoctor or health care professional if they continue or are bothersome): bone pain cough diarrhea joint pain muscle pain muscle weakness swelling of arms or legs tiredness weight loss This list may not describe all possible side effects. Call your doctor for medical advice about side effects. You may report side effects to FDA at1-800-FDA-1088. Where should I keep my medication? This drug is given in a hospital or clinic and will not be stored at  home. NOTE: This sheet is a summary. It may not cover all possible information. If you have questions about this medicine, talk to your doctor, pharmacist, orhealth care provider.  2022 Elsevier/Gold Standard (2020-07-30 13:59:34)

## 2021-05-31 DIAGNOSIS — I10 Essential (primary) hypertension: Secondary | ICD-10-CM | POA: Diagnosis not present

## 2021-05-31 DIAGNOSIS — E119 Type 2 diabetes mellitus without complications: Secondary | ICD-10-CM | POA: Diagnosis not present

## 2021-06-07 DIAGNOSIS — C3490 Malignant neoplasm of unspecified part of unspecified bronchus or lung: Secondary | ICD-10-CM | POA: Diagnosis not present

## 2021-06-07 DIAGNOSIS — C7931 Secondary malignant neoplasm of brain: Secondary | ICD-10-CM | POA: Diagnosis not present

## 2021-06-07 DIAGNOSIS — R634 Abnormal weight loss: Secondary | ICD-10-CM | POA: Diagnosis not present

## 2021-06-07 DIAGNOSIS — C787 Secondary malignant neoplasm of liver and intrahepatic bile duct: Secondary | ICD-10-CM | POA: Diagnosis not present

## 2021-06-07 DIAGNOSIS — K5900 Constipation, unspecified: Secondary | ICD-10-CM | POA: Diagnosis not present

## 2021-06-07 DIAGNOSIS — E119 Type 2 diabetes mellitus without complications: Secondary | ICD-10-CM | POA: Diagnosis not present

## 2021-06-15 ENCOUNTER — Inpatient Hospital Stay: Payer: Medicare HMO

## 2021-06-15 ENCOUNTER — Encounter: Payer: Self-pay | Admitting: Oncology

## 2021-06-15 ENCOUNTER — Inpatient Hospital Stay: Payer: Medicare HMO | Attending: Oncology

## 2021-06-15 ENCOUNTER — Other Ambulatory Visit: Payer: Self-pay

## 2021-06-15 ENCOUNTER — Inpatient Hospital Stay (HOSPITAL_BASED_OUTPATIENT_CLINIC_OR_DEPARTMENT_OTHER): Payer: Medicare HMO | Admitting: Oncology

## 2021-06-15 VITALS — BP 123/71 | HR 61 | Temp 97.8°F | Resp 20 | Wt 123.4 lb

## 2021-06-15 DIAGNOSIS — R5383 Other fatigue: Secondary | ICD-10-CM | POA: Diagnosis not present

## 2021-06-15 DIAGNOSIS — Z95828 Presence of other vascular implants and grafts: Secondary | ICD-10-CM

## 2021-06-15 DIAGNOSIS — C3490 Malignant neoplasm of unspecified part of unspecified bronchus or lung: Secondary | ICD-10-CM

## 2021-06-15 DIAGNOSIS — Z9221 Personal history of antineoplastic chemotherapy: Secondary | ICD-10-CM | POA: Insufficient documentation

## 2021-06-15 DIAGNOSIS — R222 Localized swelling, mass and lump, trunk: Secondary | ICD-10-CM | POA: Insufficient documentation

## 2021-06-15 DIAGNOSIS — M47816 Spondylosis without myelopathy or radiculopathy, lumbar region: Secondary | ICD-10-CM | POA: Insufficient documentation

## 2021-06-15 DIAGNOSIS — C3412 Malignant neoplasm of upper lobe, left bronchus or lung: Secondary | ICD-10-CM | POA: Insufficient documentation

## 2021-06-15 DIAGNOSIS — I700 Atherosclerosis of aorta: Secondary | ICD-10-CM | POA: Insufficient documentation

## 2021-06-15 DIAGNOSIS — Z923 Personal history of irradiation: Secondary | ICD-10-CM | POA: Insufficient documentation

## 2021-06-15 DIAGNOSIS — C7931 Secondary malignant neoplasm of brain: Secondary | ICD-10-CM | POA: Diagnosis not present

## 2021-06-15 DIAGNOSIS — T402X5A Adverse effect of other opioids, initial encounter: Secondary | ICD-10-CM | POA: Diagnosis not present

## 2021-06-15 DIAGNOSIS — M4316 Spondylolisthesis, lumbar region: Secondary | ICD-10-CM | POA: Diagnosis not present

## 2021-06-15 DIAGNOSIS — C787 Secondary malignant neoplasm of liver and intrahepatic bile duct: Secondary | ICD-10-CM

## 2021-06-15 DIAGNOSIS — Z803 Family history of malignant neoplasm of breast: Secondary | ICD-10-CM | POA: Insufficient documentation

## 2021-06-15 DIAGNOSIS — D1803 Hemangioma of intra-abdominal structures: Secondary | ICD-10-CM | POA: Insufficient documentation

## 2021-06-15 DIAGNOSIS — M5137 Other intervertebral disc degeneration, lumbosacral region: Secondary | ICD-10-CM | POA: Insufficient documentation

## 2021-06-15 DIAGNOSIS — Z79899 Other long term (current) drug therapy: Secondary | ICD-10-CM | POA: Diagnosis not present

## 2021-06-15 DIAGNOSIS — Z8249 Family history of ischemic heart disease and other diseases of the circulatory system: Secondary | ICD-10-CM | POA: Diagnosis not present

## 2021-06-15 DIAGNOSIS — J90 Pleural effusion, not elsewhere classified: Secondary | ICD-10-CM | POA: Diagnosis not present

## 2021-06-15 DIAGNOSIS — Z5112 Encounter for antineoplastic immunotherapy: Secondary | ICD-10-CM

## 2021-06-15 DIAGNOSIS — G893 Neoplasm related pain (acute) (chronic): Secondary | ICD-10-CM | POA: Diagnosis not present

## 2021-06-15 DIAGNOSIS — R590 Localized enlarged lymph nodes: Secondary | ICD-10-CM | POA: Insufficient documentation

## 2021-06-15 DIAGNOSIS — Z8349 Family history of other endocrine, nutritional and metabolic diseases: Secondary | ICD-10-CM | POA: Insufficient documentation

## 2021-06-15 DIAGNOSIS — K5903 Drug induced constipation: Secondary | ICD-10-CM | POA: Diagnosis not present

## 2021-06-15 DIAGNOSIS — R600 Localized edema: Secondary | ICD-10-CM | POA: Insufficient documentation

## 2021-06-15 DIAGNOSIS — R0789 Other chest pain: Secondary | ICD-10-CM | POA: Diagnosis not present

## 2021-06-15 DIAGNOSIS — R072 Precordial pain: Secondary | ICD-10-CM | POA: Diagnosis not present

## 2021-06-15 LAB — CBC WITH DIFFERENTIAL/PLATELET
Abs Immature Granulocytes: 0.01 10*3/uL (ref 0.00–0.07)
Basophils Absolute: 0 10*3/uL (ref 0.0–0.1)
Basophils Relative: 0 %
Eosinophils Absolute: 0.1 10*3/uL (ref 0.0–0.5)
Eosinophils Relative: 2 %
HCT: 33.3 % — ABNORMAL LOW (ref 36.0–46.0)
Hemoglobin: 10.7 g/dL — ABNORMAL LOW (ref 12.0–15.0)
Immature Granulocytes: 0 %
Lymphocytes Relative: 19 %
Lymphs Abs: 0.6 10*3/uL — ABNORMAL LOW (ref 0.7–4.0)
MCH: 28.5 pg (ref 26.0–34.0)
MCHC: 32.1 g/dL (ref 30.0–36.0)
MCV: 88.8 fL (ref 80.0–100.0)
Monocytes Absolute: 0.5 10*3/uL (ref 0.1–1.0)
Monocytes Relative: 16 %
Neutro Abs: 2.1 10*3/uL (ref 1.7–7.7)
Neutrophils Relative %: 63 %
Platelets: 244 10*3/uL (ref 150–400)
RBC: 3.75 MIL/uL — ABNORMAL LOW (ref 3.87–5.11)
RDW: 15.6 % — ABNORMAL HIGH (ref 11.5–15.5)
WBC: 3.3 10*3/uL — ABNORMAL LOW (ref 4.0–10.5)
nRBC: 0 % (ref 0.0–0.2)

## 2021-06-15 LAB — COMPREHENSIVE METABOLIC PANEL
ALT: 14 U/L (ref 0–44)
AST: 18 U/L (ref 15–41)
Albumin: 3.6 g/dL (ref 3.5–5.0)
Alkaline Phosphatase: 54 U/L (ref 38–126)
Anion gap: 5 (ref 5–15)
BUN: 12 mg/dL (ref 8–23)
CO2: 30 mmol/L (ref 22–32)
Calcium: 9.3 mg/dL (ref 8.9–10.3)
Chloride: 102 mmol/L (ref 98–111)
Creatinine, Ser: 0.54 mg/dL (ref 0.44–1.00)
GFR, Estimated: >60 mL/min (ref >60)
Glucose, Bld: 112 mg/dL — ABNORMAL HIGH (ref 70–99)
Potassium: 3.9 mmol/L (ref 3.5–5.1)
Sodium: 137 mmol/L (ref 135–145)
Total Bilirubin: 0.4 mg/dL (ref 0.3–1.2)
Total Protein: 6.2 g/dL — ABNORMAL LOW (ref 6.5–8.1)

## 2021-06-15 MED ORDER — HEPARIN SOD (PORK) LOCK FLUSH 100 UNIT/ML IV SOLN
INTRAVENOUS | Status: AC
  Filled 2021-06-15: qty 5

## 2021-06-15 MED ORDER — SODIUM CHLORIDE 0.9% FLUSH
10.0000 mL | Freq: Once | INTRAVENOUS | Status: AC
  Administered 2021-06-15: 10 mL via INTRAVENOUS
  Filled 2021-06-15: qty 10

## 2021-06-15 MED ORDER — HEPARIN SOD (PORK) LOCK FLUSH 100 UNIT/ML IV SOLN
500.0000 [IU] | Freq: Once | INTRAVENOUS | Status: AC | PRN
Start: 2021-06-15 — End: 2021-06-15
  Administered 2021-06-15: 500 [IU]
  Filled 2021-06-15: qty 5

## 2021-06-15 MED ORDER — SODIUM CHLORIDE 0.9 % IV SOLN
Freq: Once | INTRAVENOUS | Status: AC
Start: 2021-06-15 — End: 2021-06-15
  Filled 2021-06-15: qty 250

## 2021-06-15 MED ORDER — SODIUM CHLORIDE 0.9 % IV SOLN
1200.0000 mg | Freq: Once | INTRAVENOUS | Status: AC
  Administered 2021-06-15: 1200 mg via INTRAVENOUS
  Filled 2021-06-15: qty 20

## 2021-06-15 MED ORDER — HEPARIN SOD (PORK) LOCK FLUSH 100 UNIT/ML IV SOLN
500.0000 [IU] | Freq: Once | INTRAVENOUS | Status: DC
  Filled 2021-06-15: qty 5

## 2021-06-15 NOTE — Progress Notes (Signed)
Nutrition Follow-up:   Patient with small cell lung cancer with metastatic disease.  Patient receiving tecentriq.    Met with patient during infusion.  Patient says that appetite is pretty good.  Reports that she usually eats cereal or grits and egg or toast and fruit for breakfast.  Yesterday ate green beans, chicken wing and macaroni and cheese. Food is tasting better. Denies nausea.  Says bowels are moving with help of miralax.      Medications: remeron  Labs: glucose 112  Anthropometrics:   Weight 123 lb 6.4 oz today  118 lb on 7/12 121 lb 14.4 oz on 6/21 123 lb on 5/31 124 lb on 3/22 138 lb on 2/8 147 lb on 1/8   NUTRITION DIAGNOSIS: Inadequate oral intake improved with weight gain    INTERVENTION:  Coupons for ensure given. Continue 2-3 per day of ensure original. Does not like the plus Continue with remeron    MONITORING, EVALUATION, GOAL: weight trends, intake   NEXT VISIT: to be determined with treatments  Khizar Fiorella B. Zenia Resides, [place], [place] Registered Dietitian [phone] (mobile)

## 2021-06-15 NOTE — Progress Notes (Signed)
Hematology/Oncology Consult note [facility]  Telephone:([phone] [phone]  Patient Care Team: Leonel Ramsay, MD as PCP - General (Infectious Diseases) Telford Nab, RN as Oncology Nurse Navigator   Name of the patient: Doris Johnson  [id number]  04/10/1951   Date of visit: 06/15/21  Diagnosis- extensive stage small cell lung cancer with liver and brain metastases    Chief complaint/ Reason for visit-on treatment assessment prior to cycle 6 of maintenance Tecentriq  Heme/Onc history: Patient is a 70 year old female with a remote history of smoking in her teenage years and exposure to passive smoking.  She has been having ongoing midsternal pain which has been growing since the last 6 to 7 months.  She had been to urgent care as well in the past.  She finally had a CT chest with contrast done in October 2021 which showed a large mass in the left side of the mediastinum measuring 6.7 x 6.2 x 6.1 cm causing severe compression of the left main pulmonary artery and around the aortic arch in the left subclavian artery.  Enlarged thyroid gland.  Left upper lobe nodule 1 x 0.7 cm.  She then underwent bronchoscopy with Dr.Aleskerov left upper lobe biopsy was nondiagnostic.  However station 4, 7 and 10 L lymph nodes were consistent with metastatic small cell carcinoma.   PET CT scan showed enlarged level 3 cervical lymph node 2.2 cm that was hypermetabolic with an SUV of 9.7.  Large central 7.3 cm AP window mass.  5.7 cm left liver mass.  MRI brain with and without contrast showed 2 small foci of enhancement in the right cerebellar hemisphere and right temporal lobe without mass-effect or edema as well concerning for brain metastases   Patient had 2 cycles of carbo etoposide chemotherapy along with Tecentriq and subsequent scan showed no significant improvement in the primary lung mass or liver mass.  She received radiation treatment to her primary lung  mass and also seen by [facility] for second opinion.  Her pathology was reviewed at [facility] and reported as possible neuroendocrine neoplasm But stated that small cell carcinoma cannot be excluded but not definitely diagnostic for it.  However [facility] pathology feels that this is consistent with small cell lung cancer.  She has completed 4 cycles of carbo etoposide Tecentriq chemotherapy and is currently on maintenance Tecentriq.  Repeat liver biopsy was also performed and was consistent with small cell lung cancer  Interval history-she has ongoing midsternal chest wall pain which is not well controlled with as needed oxycodone.  She did not like how long-acting morphine made her feel and therefore stopped taking it.  Reports ongoing fatigue.  Denies other complaints at this time  ECOG PS- 1 Pain scale- 0   Review of systems- Review of Systems  Constitutional:  Positive for malaise/fatigue. Negative for chills, fever and weight loss.  HENT:  Negative for congestion, ear discharge and nosebleeds.   Eyes:  Negative for blurred vision.  Respiratory:  Negative for cough, hemoptysis, sputum production, shortness of breath and wheezing.        Midsternal chest wall pain  Cardiovascular:  Negative for chest pain, palpitations, orthopnea and claudication.  Gastrointestinal:  Negative for abdominal pain, blood in stool, constipation, diarrhea, heartburn, melena, nausea and vomiting.  Genitourinary:  Negative for dysuria, flank pain, frequency, hematuria and urgency.  Musculoskeletal:  Negative for back pain, joint pain and myalgias.  Skin:  Negative for rash.  Neurological:  Negative for dizziness,  tingling, focal weakness, seizures, weakness and headaches.  Endo/Heme/Allergies:  Does not bruise/bleed easily.  Psychiatric/Behavioral:  Negative for depression and suicidal ideas. The patient does not have insomnia.      No Known Allergies   Past Medical History:  Diagnosis Date   Cancer ([place])    Cataract     Diabetes mellitus without complication ([place])    Hyperlipidemia    Hypertension    Hypothyroidism    doctor's keeping an eye on thyroid      Past Surgical History:  Procedure Laterality Date   ABDOMINAL HYSTERECTOMY     PORTA CATH INSERTION N/A 11/30/2020   Procedure: PORTA CATH INSERTION;  Surgeon: Algernon Huxley, MD;  Location: [facility];  Service: Cardiovascular;  Laterality: N/A;   VIDEO BRONCHOSCOPY WITH ENDOBRONCHIAL NAVIGATION N/A 10/21/2020   Procedure: VIDEO BRONCHOSCOPY WITH ENDOBRONCHIAL NAVIGATION;  Surgeon: Ottie Glazier, MD;  Location: [facility] ORS;  Service: Thoracic;  Laterality: N/A;   VIDEO BRONCHOSCOPY WITH ENDOBRONCHIAL ULTRASOUND N/A 10/21/2020   Procedure: VIDEO BRONCHOSCOPY WITH ENDOBRONCHIAL ULTRASOUND;  Surgeon: Ottie Glazier, MD;  Location: [facility] ORS;  Service: Thoracic;  Laterality: N/A;    Social History   Socioeconomic History   Marital status: Single    Spouse name: Not on file   Number of children: Not on file   Years of education: Not on file   Highest education level: Not on file  Occupational History   Not on file  Tobacco Use   Smoking status: Never   Smokeless tobacco: Never  Vaping Use   Vaping Use: Never used  Substance and Sexual Activity   Alcohol use: No   Drug use: No   Sexual activity: Not Currently  Other Topics Concern   Not on file  Social History Narrative   Not on file   Social Determinants of Health   Financial Resource Strain: Not on file  Food Insecurity: Not on file  Transportation Needs: Not on file  Physical Activity: Not on file  Stress: Not on file  Social Connections: Not on file  Intimate Partner Violence: Not on file    Family History  Problem Relation Age of Onset   Cancer Mother        breast   Hypertension Mother    Breast cancer Mother 37   Heart disease Father    Hyperlipidemia Brother    Heart disease Brother      Current Outpatient Medications:    docusate sodium (COLACE) 100 MG  capsule, Take 200 mg by mouth at bedtime., Disp: , Rfl:    fluticasone (FLONASE) 50 MCG/ACT nasal spray, Place 2 sprays into both nostrils daily., Disp: 16 g, Rfl: 5   lidocaine-prilocaine (EMLA) cream, Apply 1 application topically as needed. Apply small amount to port site at least 1 hour prior to it being accessed, cover with plastic wrap, Disp: 30 g, Rfl: 1   mirtazapine (REMERON) 15 MG tablet, Take 15 mg by mouth at bedtime., Disp: , Rfl:    morphine (MS CONTIN) 15 MG 12 hr tablet, Take 1 tablet (15 mg total) by mouth every 12 (twelve) hours., Disp: 60 tablet, Rfl: 0   oxycodone (ROXICODONE) 30 MG immediate release tablet, Take 1 tablet (30 mg total) by mouth every 4 (four) hours as needed for pain., Disp: 180 tablet, Rfl: 0   polyethylene glycol (MIRALAX / GLYCOLAX) 17 g packet, Take 17 g by mouth daily., Disp: , Rfl:    potassium chloride SA (KLOR-CON) 20 MEQ tablet, Take  2 tablets (40 mEq total) by mouth daily., Disp: 7 tablet, Rfl: 0   LORazepam (ATIVAN) 0.5 MG tablet, Take 1 tablet (0.5 mg total) by mouth every 6 (six) hours as needed (Nausea or vomiting). (Patient not taking: No sig reported), Disp: 30 tablet, Rfl: 0   ondansetron (ZOFRAN) 8 MG tablet, Take 1 tablet (8 mg total) by mouth 2 (two) times daily as needed for refractory nausea / vomiting. Start on day 3 after carboplatin chemo. (Patient not taking: No sig reported), Disp: 30 tablet, Rfl: 1   prochlorperazine (COMPAZINE) 10 MG tablet, Take 1 tablet (10 mg total) by mouth every 6 (six) hours as needed (Nausea or vomiting). (Patient not taking: No sig reported), Disp: 30 tablet, Rfl: 1 No current facility-administered medications for this visit.  Facility-Administered Medications Ordered in Other Visits:    sodium chloride flush (NS) 0.9 % injection 10 mL, 10 mL, Intravenous, PRN, Sindy Guadeloupe, MD, 10 mL at 12/15/20 0850  Physical exam:  Vitals:   06/15/21 0949 06/15/21 0954  BP:  123/71  Pulse:  61  Resp:  20  Temp:  97.8  F (36.6 C)  SpO2:  100%  Weight: 123 lb 6.4 oz (56 kg) 123 lb 6.4 oz (56 kg)   Physical Exam Constitutional:      Comments: Patient is thin and cachectic.  Appears in no acute distress  Cardiovascular:     Rate and Rhythm: Normal rate and regular rhythm.     Heart sounds: Normal heart sounds.  Pulmonary:     Effort: Pulmonary effort is normal.     Breath sounds: Normal breath sounds.  Abdominal:     General: Bowel sounds are normal.     Palpations: Abdomen is soft.  Skin:    General: Skin is warm and dry.  Neurological:     Mental Status: She is alert and oriented to person, place, and time.     CMP Latest Ref Rng & Units 06/15/2021  Glucose 70 - 99 mg/dL 112(H)  BUN 8 - 23 mg/dL 12  Creatinine 0.44 - 1.00 mg/dL 0.54  Sodium 135 - 145 mmol/L 137  Potassium 3.5 - 5.1 mmol/L 3.9  Chloride 98 - 111 mmol/L 102  CO2 22 - 32 mmol/L 30  Calcium 8.9 - 10.3 mg/dL 9.3  Total Protein 6.5 - 8.1 g/dL 6.2(L)  Total Bilirubin 0.3 - 1.2 mg/dL 0.4  Alkaline Phos 38 - 126 U/L 54  AST 15 - 41 U/L 18  ALT 0 - 44 U/L 14   CBC Latest Ref Rng & Units 06/15/2021  WBC 4.0 - 10.5 K/uL 3.3(L)  Hemoglobin 12.0 - 15.0 g/dL 10.7(L)  Hematocrit 36.0 - 46.0 % 33.3(L)  Platelets 150 - 400 K/uL 244     Assessment and plan- Patient is a 70 y.o. female with extensive stage small cell lung cancer here for on treatment assessment prior to cycle 6 of maintenance Tecentriq  Counts okay to proceed with cycle 6 of maintenance Tecentriq today.  I will see her back in 3 weeks for cycle 7.  She will need CT chest abdomen and pelvis with contrast and bone scan prior to her next visit.  Continue bowel medications for opioid associated constipation  Neoplasm related pain: Continue as needed oxycodone I will add fentanyl patch 12 mcg to her regimen as well.    Visit Diagnosis 1. Small cell lung cancer ([place])   2. Small cell carcinoma of lung metastatic to liver ([place])   3. Encounter  for antineoplastic  immunotherapy      Dr. Randa Evens, MD, MPH [facility] at [facility] [phone] 06/15/2021 5:04 PM

## 2021-06-15 NOTE — Patient Instructions (Signed)
[facility]COL[facility]  Discharge Instructions: Thank you for choosing [facility] to provide your oncology and hematology care.  If you have a lab appointment with the [facility], please go directly to the [facility] and check in at the registration area.  Wear comfortable clothing and clothing appropriate for easy access to any Portacath or PICC line.   We strive to give you quality time with your provider. You may need to reschedule your appointment if you arrive late (15 or more minutes).  Arriving late affects you and other patients whose appointments are after yours.  Also, if you miss three or more appointments without notifying the office, you may be dismissed from the clinic at the provider's discretion.      For prescription refill requests, have your pharmacy contact our office and allow 72 hours for refills to be completed.    Today you received the following chemotherapy and/or immunotherapy agents Tecentriq   To help prevent nausea and vomiting after your treatment, we encourage you to take your nausea medication as directed.  BELOW ARE SYMPTOMS THAT SHOULD BE REPORTED IMMEDIATELY: *FEVER GREATER THAN 100.4 F (38 C) OR HIGHER *CHILLS OR SWEATING *NAUSEA AND VOMITING THAT IS NOT CONTROLLED WITH YOUR NAUSEA MEDICATION *UNUSUAL SHORTNESS OF BREATH *UNUSUAL BRUISING OR BLEEDING *URINARY PROBLEMS (pain or burning when urinating, or frequent urination) *BOWEL PROBLEMS (unusual diarrhea, constipation, pain near the anus) TENDERNESS IN MOUTH AND THROAT WITH OR WITHOUT PRESENCE OF ULCERS (sore throat, sores in mouth, or a toothache) UNUSUAL RASH, SWELLING OR PAIN  UNUSUAL VAGINAL DISCHARGE OR ITCHING   Items with * indicate a potential emergency and should be followed up as soon as possible or go to the Emergency Department if any problems should occur.  Please show the CHEMOTHERAPY ALERT CARD or IMMUNOTHERAPY ALERT CARD at check-in to  the Emergency Department and triage nurse.  Should you have questions after your visit or need to cancel or reschedule your appointment, please contact [facility]  [phone] and follow the prompts.  Office hours are 8:00 a.m. to 4:30 p.m. Monday - Friday. Please note that voicemails left after 4:00 p.m. may not be returned until the following business day.  We are closed weekends and major holidays. You have access to a nurse at all times for urgent questions. Please call the main number to the clinic [phone] and follow the prompts.  For any non-urgent questions, you may also contact your provider using MyChart. We now offer e-Visits for anyone 68 and older to request care online for non-urgent symptoms. For details visit [email or web address].   Also download the MyChart app! Go to the app store, search "MyChart", open the app, select [facility], and log in with your MyChart username and password.  Due to Covid, a mask is required upon entering the hospital/clinic. If you do not have a mask, one will be given to you upon arrival. For doctor visits, patients may have 1 support person aged 71 or older with them. For treatment visits, patients cannot have anyone with them due to current Covid guidelines and our immunocompromised population.

## 2021-06-21 ENCOUNTER — Other Ambulatory Visit: Payer: Self-pay | Admitting: *Deleted

## 2021-06-21 MED ORDER — FENTANYL 12 MCG/HR TD PT72: 1.0000 | MEDICATED_PATCH | TRANSDERMAL | 0 refills | Status: DC

## 2021-06-21 NOTE — Telephone Encounter (Signed)
Pt called to ask if pain patch was sent into pharmacy. Pain patch not listed on med list currently so does not appear it was sent it at last visit. Per Dr. Elroy Channel last note, fentanyl 67mcg patch is recommended to help with pain and control in addition to oxycodone. Informed pt that will send in prescription today. Instructions on how to wear patch reviewed with patient. Pt verbalized understanding.

## 2021-06-22 DIAGNOSIS — C3490 Malignant neoplasm of unspecified part of unspecified bronchus or lung: Secondary | ICD-10-CM | POA: Diagnosis not present

## 2021-06-22 DIAGNOSIS — E119 Type 2 diabetes mellitus without complications: Secondary | ICD-10-CM | POA: Diagnosis not present

## 2021-06-22 DIAGNOSIS — K5900 Constipation, unspecified: Secondary | ICD-10-CM | POA: Diagnosis not present

## 2021-06-25 ENCOUNTER — Telehealth: Payer: Self-pay | Admitting: *Deleted

## 2021-06-25 NOTE — Telephone Encounter (Signed)
Ct and bone scan is good. Mri brain will be next month

## 2021-06-25 NOTE — Telephone Encounter (Signed)
Pt left message stating she thought she needed to be scheduled for MRI scan. She couldn't remember from last clinic visit. Pt is scheduled for CT c/a/p and bone scan on 8/18 prior to next treatment.  Please advise.

## 2021-06-25 NOTE — Telephone Encounter (Signed)
Pt made aware. Reviewed appt details for imaging scheduled on 8/18. Instructed to pick up prep kit day before scan. Instructed to call back with any further questions. Pt verbalized understanding.

## 2021-07-01 ENCOUNTER — Ambulatory Visit
Admission: RE | Admit: 2021-07-01 | Discharge: 2021-07-01 | Disposition: A | Discharge status: Home / Self Care | Payer: Medicare HMO | Source: Ambulatory Visit | Attending: Oncology | Admitting: Oncology

## 2021-07-01 ENCOUNTER — Other Ambulatory Visit: Payer: Self-pay

## 2021-07-01 ENCOUNTER — Encounter
Admission: RE | Admit: 2021-07-01 | Discharge: 2021-07-01 | Disposition: A | Discharge status: Home / Self Care | Payer: Medicare HMO | Source: Ambulatory Visit | Attending: Oncology | Admitting: Oncology

## 2021-07-01 DIAGNOSIS — C787 Secondary malignant neoplasm of liver and intrahepatic bile duct: Secondary | ICD-10-CM | POA: Insufficient documentation

## 2021-07-01 DIAGNOSIS — M4316 Spondylolisthesis, lumbar region: Secondary | ICD-10-CM | POA: Diagnosis not present

## 2021-07-01 DIAGNOSIS — I288 Other diseases of pulmonary vessels: Secondary | ICD-10-CM | POA: Diagnosis not present

## 2021-07-01 DIAGNOSIS — C3490 Malignant neoplasm of unspecified part of unspecified bronchus or lung: Secondary | ICD-10-CM

## 2021-07-01 DIAGNOSIS — M5137 Other intervertebral disc degeneration, lumbosacral region: Secondary | ICD-10-CM | POA: Diagnosis not present

## 2021-07-01 DIAGNOSIS — M47816 Spondylosis without myelopathy or radiculopathy, lumbar region: Secondary | ICD-10-CM | POA: Diagnosis not present

## 2021-07-01 DIAGNOSIS — K7689 Other specified diseases of liver: Secondary | ICD-10-CM | POA: Diagnosis not present

## 2021-07-01 DIAGNOSIS — J90 Pleural effusion, not elsewhere classified: Secondary | ICD-10-CM | POA: Diagnosis not present

## 2021-07-01 DIAGNOSIS — I2510 Atherosclerotic heart disease of native coronary artery without angina pectoris: Secondary | ICD-10-CM | POA: Diagnosis not present

## 2021-07-01 MED ORDER — IOHEXOL 350 MG/ML SOLN
80.0000 mL | Freq: Once | INTRAVENOUS | Status: AC | PRN
  Administered 2021-07-01: 80 mL via INTRAVENOUS

## 2021-07-01 MED ORDER — TECHNETIUM TC 99M MEDRONATE IV KIT
19.4800 | PACK | Freq: Once | INTRAVENOUS | Status: AC | PRN
  Administered 2021-07-01: 19.48 via INTRAVENOUS

## 2021-07-06 ENCOUNTER — Other Ambulatory Visit: Payer: Self-pay

## 2021-07-06 ENCOUNTER — Inpatient Hospital Stay (HOSPITAL_BASED_OUTPATIENT_CLINIC_OR_DEPARTMENT_OTHER): Payer: Medicare HMO | Admitting: Oncology

## 2021-07-06 ENCOUNTER — Inpatient Hospital Stay: Payer: Medicare HMO

## 2021-07-06 ENCOUNTER — Telehealth: Payer: Self-pay | Admitting: *Deleted

## 2021-07-06 ENCOUNTER — Encounter: Payer: Self-pay | Admitting: Oncology

## 2021-07-06 VITALS — BP 133/80 | HR 80 | Temp 97.8°F | Resp 20 | Wt 137.0 lb

## 2021-07-06 DIAGNOSIS — C3412 Malignant neoplasm of upper lobe, left bronchus or lung: Secondary | ICD-10-CM | POA: Diagnosis not present

## 2021-07-06 DIAGNOSIS — C3490 Malignant neoplasm of unspecified part of unspecified bronchus or lung: Secondary | ICD-10-CM

## 2021-07-06 DIAGNOSIS — R0789 Other chest pain: Secondary | ICD-10-CM | POA: Diagnosis not present

## 2021-07-06 DIAGNOSIS — Z5112 Encounter for antineoplastic immunotherapy: Secondary | ICD-10-CM

## 2021-07-06 DIAGNOSIS — K5903 Drug induced constipation: Secondary | ICD-10-CM | POA: Diagnosis not present

## 2021-07-06 DIAGNOSIS — C787 Secondary malignant neoplasm of liver and intrahepatic bile duct: Secondary | ICD-10-CM | POA: Diagnosis not present

## 2021-07-06 DIAGNOSIS — G893 Neoplasm related pain (acute) (chronic): Secondary | ICD-10-CM | POA: Diagnosis not present

## 2021-07-06 DIAGNOSIS — R5383 Other fatigue: Secondary | ICD-10-CM | POA: Diagnosis not present

## 2021-07-06 DIAGNOSIS — C7931 Secondary malignant neoplasm of brain: Secondary | ICD-10-CM | POA: Diagnosis not present

## 2021-07-06 DIAGNOSIS — R590 Localized enlarged lymph nodes: Secondary | ICD-10-CM | POA: Diagnosis not present

## 2021-07-06 LAB — CBC WITH DIFFERENTIAL/PLATELET
Abs Immature Granulocytes: 0 10*3/uL (ref 0.00–0.07)
Basophils Absolute: 0 10*3/uL (ref 0.0–0.1)
Basophils Relative: 0 %
Eosinophils Absolute: 0.1 10*3/uL (ref 0.0–0.5)
Eosinophils Relative: 2 %
HCT: 31.7 % — ABNORMAL LOW (ref 36.0–46.0)
Hemoglobin: 10.1 g/dL — ABNORMAL LOW (ref 12.0–15.0)
Immature Granulocytes: 0 %
Lymphocytes Relative: 16 %
Lymphs Abs: 0.5 10*3/uL — ABNORMAL LOW (ref 0.7–4.0)
MCH: 28.3 pg (ref 26.0–34.0)
MCHC: 31.9 g/dL (ref 30.0–36.0)
MCV: 88.8 fL (ref 80.0–100.0)
Monocytes Absolute: 0.4 10*3/uL (ref 0.1–1.0)
Monocytes Relative: 13 %
Neutro Abs: 2.1 10*3/uL (ref 1.7–7.7)
Neutrophils Relative %: 69 %
Platelets: 227 10*3/uL (ref 150–400)
RBC: 3.57 MIL/uL — ABNORMAL LOW (ref 3.87–5.11)
RDW: 15.9 % — ABNORMAL HIGH (ref 11.5–15.5)
WBC: 3 10*3/uL — ABNORMAL LOW (ref 4.0–10.5)
nRBC: 0 % (ref 0.0–0.2)

## 2021-07-06 LAB — COMPREHENSIVE METABOLIC PANEL
ALT: 19 U/L (ref 0–44)
AST: 22 U/L (ref 15–41)
Albumin: 3.4 g/dL — ABNORMAL LOW (ref 3.5–5.0)
Alkaline Phosphatase: 59 U/L (ref 38–126)
Anion gap: 5 (ref 5–15)
BUN: 13 mg/dL (ref 8–23)
CO2: 26 mmol/L (ref 22–32)
Calcium: 9.1 mg/dL (ref 8.9–10.3)
Chloride: 106 mmol/L (ref 98–111)
Creatinine, Ser: 0.56 mg/dL (ref 0.44–1.00)
GFR, Estimated: >60 mL/min (ref >60)
Glucose, Bld: 96 mg/dL (ref 70–99)
Potassium: 4 mmol/L (ref 3.5–5.1)
Sodium: 137 mmol/L (ref 135–145)
Total Bilirubin: 0.5 mg/dL (ref 0.3–1.2)
Total Protein: 6.2 g/dL — ABNORMAL LOW (ref 6.5–8.1)

## 2021-07-06 LAB — TSH: TSH: 1.341 u[IU]/mL (ref 0.350–4.500)

## 2021-07-06 MED ORDER — LIDOCAINE-PRILOCAINE 2.5-2.5 % EX CREA: 1.0000 "application " | TOPICAL_CREAM | CUTANEOUS | 1 refills | Status: DC | PRN

## 2021-07-06 MED ORDER — HEPARIN SOD (PORK) LOCK FLUSH 100 UNIT/ML IV SOLN
500.0000 [IU] | Freq: Once | INTRAVENOUS | Status: AC | PRN
  Administered 2021-07-06: 500 [IU]
  Filled 2021-07-06: qty 5

## 2021-07-06 MED ORDER — SODIUM CHLORIDE 0.9 % IV SOLN
Freq: Once | INTRAVENOUS | Status: AC
  Filled 2021-07-06: qty 250

## 2021-07-06 MED ORDER — SODIUM CHLORIDE 0.9 % IV SOLN
1200.0000 mg | Freq: Once | INTRAVENOUS | Status: AC
  Administered 2021-07-06: 1200 mg via INTRAVENOUS
  Filled 2021-07-06: qty 20

## 2021-07-06 MED ORDER — SODIUM CHLORIDE 0.9% FLUSH
10.0000 mL | Freq: Once | INTRAVENOUS | Status: AC
  Administered 2021-07-06: 10 mL via INTRAVENOUS
  Filled 2021-07-06: qty 10

## 2021-07-06 NOTE — Patient Instructions (Signed)
[facility]  Discharge Instructions: Thank you for choosing [facility] to provide your oncology and hematology care.  If you have a lab appointment with the [facility], please go directly to the [facility] and check in at the registration area.  Wear comfortable clothing and clothing appropriate for easy access to any Portacath or PICC line.   We strive to give you quality time with your provider. You may need to reschedule your appointment if you arrive late (15 or more minutes).  Arriving late affects you and other patients whose appointments are after yours.  Also, if you miss three or more appointments without notifying the office, you may be dismissed from the clinic at the provider's discretion.      For prescription refill requests, have your pharmacy contact our office and allow 72 hours for refills to be completed.    Today you received the following chemotherapy and/or immunotherapy agents : tecentriq    To help prevent nausea and vomiting after your treatment, we encourage you to take your nausea medication as directed.  BELOW ARE SYMPTOMS THAT SHOULD BE REPORTED IMMEDIATELY: *FEVER GREATER THAN 100.4 F (38 C) OR HIGHER *CHILLS OR SWEATING *NAUSEA AND VOMITING THAT IS NOT CONTROLLED WITH YOUR NAUSEA MEDICATION *UNUSUAL SHORTNESS OF BREATH *UNUSUAL BRUISING OR BLEEDING *URINARY PROBLEMS (pain or burning when urinating, or frequent urination) *BOWEL PROBLEMS (unusual diarrhea, constipation, pain near the anus) TENDERNESS IN MOUTH AND THROAT WITH OR WITHOUT PRESENCE OF ULCERS (sore throat, sores in mouth, or a toothache) UNUSUAL RASH, SWELLING OR PAIN  UNUSUAL VAGINAL DISCHARGE OR ITCHING   Items with * indicate a potential emergency and should be followed up as soon as possible or go to the Emergency Department if any problems should occur.  Please show the CHEMOTHERAPY ALERT CARD or IMMUNOTHERAPY ALERT CARD at check-in  to the Emergency Department and triage nurse.  Should you have questions after your visit or need to cancel or reschedule your appointment, please contact [facility]  [phone] and follow the prompts.  Office hours are 8:00 a.m. to 4:30 p.m. Monday - Friday. Please note that voicemails left after 4:00 p.m. may not be returned until the following business day.  We are closed weekends and major holidays. You have access to a nurse at all times for urgent questions. Please call the main number to the clinic [phone] and follow the prompts.  For any non-urgent questions, you may also contact your provider using MyChart. We now offer e-Visits for anyone 43 and older to request care online for non-urgent symptoms. For details visit [email or web address].   Also download the MyChart app! Go to the app store, search "MyChart", open the app, select [facility], and log in with your MyChart username and password.  Due to Covid, a mask is required upon entering the hospital/clinic. If you do not have a mask, one will be given to you upon arrival. For doctor visits, patients may have 1 support person aged 44 or older with them. For treatment visits, patients cannot have anyone with them due to current Covid guidelines and our immunocompromised population.

## 2021-07-06 NOTE — Progress Notes (Signed)
Patient states her feet has been swollen for the 3-4 days.

## 2021-07-06 NOTE — Progress Notes (Signed)
I connected with Doris Johnson on 07/06/21 at  9:00 AM EDT by video enabled telemedicine visit and verified that I am speaking with the correct person using two identifiers.   I discussed the limitations, risks, security and privacy concerns of performing an evaluation and management service by telemedicine and the availability of in-person appointments. I also discussed with the patient that there may be a patient responsible charge related to this service. The patient expressed understanding and agreed to proceed.  Other persons participating in the visit and their role in the encounter:  none  Patient's location:  cancer center Provider's location:  home  Chief Complaint: On treatment assessment prior to cycle 7 of maintenance Tecentriq  History of present illness: Patient is a 70 year old female with a remote history of smoking in her teenage years and exposure to passive smoking.  She has been having ongoing midsternal pain which has been growing since the last 6 to 7 months.  She had been to urgent care as well in the past.  She finally had a CT chest with contrast done in October 2021 which showed a large mass in the left side of the mediastinum measuring 6.7 x 6.2 x 6.1 cm causing severe compression of the left main pulmonary artery and around the aortic arch in the left subclavian artery.  Enlarged thyroid gland.  Left upper lobe nodule 1 x 0.7 cm.  She then underwent bronchoscopy with Dr.Aleskerov left upper lobe biopsy was nondiagnostic.  However station 4, 7 and 10 L lymph nodes were consistent with metastatic small cell carcinoma.   PET CT scan showed enlarged level 3 cervical lymph node 2.2 cm that was hypermetabolic with an SUV of 9.7.  Large central 7.3 cm AP window mass.  5.7 cm left liver mass.  MRI brain with and without contrast showed 2 small foci of enhancement in the right cerebellar hemisphere and right temporal lobe without mass-effect or edema as well concerning for brain  metastases   Patient had 2 cycles of carbo etoposide chemotherapy along with Tecentriq and subsequent scan showed no significant improvement in the primary lung mass or liver mass.  She received radiation treatment to her primary lung mass and also seen by [facility] for second opinion.  Her pathology was reviewed at [facility] and reported as possible neuroendocrine neoplasm But stated that small cell carcinoma cannot be excluded but not definitely diagnostic for it.  However [facility] pathology feels that this is consistent with small cell lung cancer.  She has completed 4 cycles of carbo etoposide Tecentriq chemotherapy and is currently on maintenance Tecentriq.  Repeat liver biopsy was also performed and was consistent with small cell lung cancer  Interval history: Has ongoing intermittent midsternal chest pain which is relatively well with fentanyl patch and prn oxycodone. She has gained some weight. Has ongoing fatigue   Review of Systems  Constitutional:  Positive for malaise/fatigue.  Respiratory:         Chronic mid sternal pain   No Known Allergies  Past Medical History:  Diagnosis Date   Cancer ([place])    Cataract    Diabetes mellitus without complication ([place])    Hyperlipidemia    Hypertension    Hypothyroidism    doctor's keeping an eye on thyroid     Past Surgical History:  Procedure Laterality Date   ABDOMINAL HYSTERECTOMY     PORTA CATH INSERTION N/A 11/30/2020   Procedure: PORTA CATH INSERTION;  Surgeon: Algernon Huxley, MD;  Location: [facility]  CV LAB;  Service: Cardiovascular;  Laterality: N/A;   VIDEO BRONCHOSCOPY WITH ENDOBRONCHIAL NAVIGATION N/A 10/21/2020   Procedure: VIDEO BRONCHOSCOPY WITH ENDOBRONCHIAL NAVIGATION;  Surgeon: Ottie Glazier, MD;  Location: [facility] ORS;  Service: Thoracic;  Laterality: N/A;   VIDEO BRONCHOSCOPY WITH ENDOBRONCHIAL ULTRASOUND N/A 10/21/2020   Procedure: VIDEO BRONCHOSCOPY WITH ENDOBRONCHIAL ULTRASOUND;  Surgeon: Ottie Glazier, MD;  Location: [facility]  ORS;  Service: Thoracic;  Laterality: N/A;    Social History   Socioeconomic History   Marital status: Single    Spouse name: Not on file   Number of children: Not on file   Years of education: Not on file   Highest education level: Not on file  Occupational History   Not on file  Tobacco Use   Smoking status: Never   Smokeless tobacco: Never  Vaping Use   Vaping Use: Never used  Substance and Sexual Activity   Alcohol use: No   Drug use: No   Sexual activity: Not Currently  Other Topics Concern   Not on file  Social History Narrative   Not on file   Social Determinants of Health   Financial Resource Strain: Not on file  Food Insecurity: Not on file  Transportation Needs: Not on file  Physical Activity: Not on file  Stress: Not on file  Social Connections: Not on file  Intimate Partner Violence: Not on file    Family History  Problem Relation Age of Onset   Cancer Mother        breast   Hypertension Mother    Breast cancer Mother 54   Heart disease Father    Hyperlipidemia Brother    Heart disease Brother      Current Outpatient Medications:    fentaNYL (DURAGESIC) 12 MCG/HR, Place 1 patch onto the skin every 3 (three) days., Disp: 10 patch, Rfl: 0   oxycodone (ROXICODONE) 30 MG immediate release tablet, Take 1 tablet (30 mg total) by mouth every 4 (four) hours as needed for pain., Disp: 180 tablet, Rfl: 0   potassium chloride SA (KLOR-CON) 20 MEQ tablet, Take 2 tablets (40 mEq total) by mouth daily., Disp: 7 tablet, Rfl: 0   docusate sodium (COLACE) 100 MG capsule, Take 200 mg by mouth at bedtime. (Patient not taking: Reported on 07/06/2021), Disp: , Rfl:    fluticasone (FLONASE) 50 MCG/ACT nasal spray, Place 2 sprays into both nostrils daily. (Patient not taking: Reported on 07/06/2021), Disp: 16 g, Rfl: 5   lidocaine-prilocaine (EMLA) cream, Apply 1 application topically as needed. Apply small amount to port site at least 1 hour prior to it being accessed,  cover with plastic wrap, Disp: 30 g, Rfl: 1   LORazepam (ATIVAN) 0.5 MG tablet, Take 1 tablet (0.5 mg total) by mouth every 6 (six) hours as needed (Nausea or vomiting). (Patient not taking: No sig reported), Disp: 30 tablet, Rfl: 0   mirtazapine (REMERON) 15 MG tablet, Take 15 mg by mouth at bedtime. (Patient not taking: Reported on 07/06/2021), Disp: , Rfl:    ondansetron (ZOFRAN) 8 MG tablet, Take 1 tablet (8 mg total) by mouth 2 (two) times daily as needed for refractory nausea / vomiting. Start on day 3 after carboplatin chemo. (Patient not taking: No sig reported), Disp: 30 tablet, Rfl: 1   polyethylene glycol (MIRALAX / GLYCOLAX) 17 g packet, Take 17 g by mouth daily. (Patient not taking: Reported on 07/06/2021), Disp: , Rfl:    prochlorperazine (COMPAZINE) 10 MG tablet, Take 1 tablet (10 mg total)  by mouth every 6 (six) hours as needed (Nausea or vomiting). (Patient not taking: No sig reported), Disp: 30 tablet, Rfl: 1 No current facility-administered medications for this visit.  Facility-Administered Medications Ordered in Other Visits:    sodium chloride flush (NS) 0.9 % injection 10 mL, 10 mL, Intravenous, PRN, Sindy Guadeloupe, MD, 10 mL at 12/15/20 0850  NM Bone Scan Whole Body  Result Date: 07/03/2021 CLINICAL DATA:  Small cell lung cancer, sternal chest pain EXAM: NUCLEAR MEDICINE WHOLE BODY BONE SCAN TECHNIQUE: Whole body anterior and posterior images were obtained approximately 3 hours after intravenous injection of radiopharmaceutical. RADIOPHARMACEUTICALS:  19.48 mCi Technetium-69m MDP IV COMPARISON:  None. Findings are correlated with CT examination performed concurrently FINDINGS: Uptake within the shoulders, knees and midthoracic spine is likely degenerative in nature. No focal uptake of radiotracer to suggest osseous metastatic disease. Normal soft tissue distribution. Normal uptake and excretion within the kidneys and bladder. IMPRESSION: No osseous metastatic disease. Electronically  Signed   By: Fidela Salisbury M.D.   On: 07/03/2021 00:06   CT CHEST ABDOMEN PELVIS W CONTRAST  Result Date: 07/02/2021 CLINICAL DATA:  Restaging small cell lung cancer. EXAM: CT CHEST, ABDOMEN, AND PELVIS WITH CONTRAST TECHNIQUE: Multidetector CT imaging of the chest, abdomen and pelvis was performed following the standard protocol during bolus administration of intravenous contrast. CONTRAST:  18mL OMNIPAQUE IOHEXOL 350 MG/ML SOLN COMPARISON:  03/16/2021 FINDINGS: CT CHEST FINDINGS Cardiovascular: Normal heart size. No pericardial effusion. Mild aortic atherosclerotic calcifications noted. Mediastinal tumor encases and narrows the left main pulmonary artery, image 20/2. Mediastinum/Nodes: Enlarged and heterogeneous thyroid gland is again noted and appears unchanged from previous exam In the setting of significant comorbidities or limited life expectancy, no follow-up recommended (ref: J Am Coll Radiol. 2015 Feb;12(2): 143-50).The trachea appears patent and is midline. Normal appearance of the esophagus. Large, heterogeneous mediastinal mass centered in the AP window lymph node is again noted. As noted previously this encases the aortic arch and left main pulmonary artery. This measures 6.6 x 6.5 by 5.0 cm (volume = 110 cm^3), image 18/2 and image 59/5. On the previous exam this measured 7.1 x 6.2 by 6.0 cm (volume = 140 cm^3). Left pre-vascular lymph node within the superior mediastinum measures 1.6 cm, image 11/2. Previously 2.1 cm. Lungs/Pleura: There is a small left effusion which appears decreased in volume from previous exam. Bandlike area of subsegmental scar is identified within the anterior left upper lobe and appears similar to previous exam. New patchy areas of ground-glass densities are scattered within the left upper lobe are identified compatible with a nonspecific pneumonitis. New nodular density within the superior segment of left lower lobe measures 6 mm, image 47/4. Nonspecific. Small solid  nodule within the left apex measures 6 mm, image 23/4. Unchanged from previous exam. New patchy area of ground-glass and airspace density is identified within the anteromedial right upper lobe, image 36/4. Small subpleural nodule within the periphery of the right middle lobe is unchanged measuring 4 mm, image 63/4. Right lower lobe subpleural nodule along the major fissure of the right lung is also unchanged measuring 3 mm, image 65/4. Musculoskeletal: No chest wall mass or suspicious bone lesions identified. CT ABDOMEN PELVIS FINDINGS Hepatobiliary: Mass within segment 4 B measures 4.4 x 4.2 cm, image 48/2. Previously 6.0 x 6.9 cm. No new suspicious liver lesions. There is a stable, benign appearing 1.8 cm enhancing lesion within the posteromedial right hepatic lobe, image 42/2. This is favored to represent a liver hemangioma.  No signs of gallbladder inflammation or common bile duct dilatation. Pancreas: Unremarkable. No pancreatic ductal dilatation or surrounding inflammatory changes. Spleen: Normal in size without focal abnormality. Adrenals/Urinary Tract: Adrenal glands are unremarkable. Kidneys are normal, without renal calculi, focal lesion, or hydronephrosis. Bladder is unremarkable. Stomach/Bowel: Stomach appears normal. No bowel wall thickening, inflammation, or distension. Moderate stool burden noted throughout the colon. Vascular/Lymphatic: No aneurysm. No significant abdominopelvic adenopathy. Reproductive: Status post hysterectomy.  No adnexal mass identified. Other: Diffuse subcutaneous soft tissue edema is identified throughout the imaged portions of the body wall. No significant free fluid or fluid collections within the abdomen or pelvis. Musculoskeletal: Lumbar spondylosis identified with anterolisthesis of L4 on L5. Marked degenerative disc disease noted at L5-S1. No acute or suspicious osseous findings identified. IMPRESSION: 1. Interval response to therapy. The dominant mediastinal mass centered  in the AP window lymph node exhibits mild interval decrease in size as described above. There is continued significant encasement and marked narrowing of the left main pulmonary artery. 2. Decrease in size of left pre-vascular superior mediastinal lymph node. 3. Decrease in size of left lobe of liver metastasis. 4. New patchy areas of ground-glass and airspace density within the left upper lobe and anteromedial right upper lobe are identified compatible with a nonspecific pneumonitis. Attention on follow-up imaging is advised. 5. Nonspecific pulmonary nodule within the superior segment of the left lower lobe is identified measuring 6 mm. Attention on future surveillance imaging is advised. 6. Decrease in volume of left pleural effusion. 7. Diffuse subcutaneous soft tissue edema throughout the imaged portions of the body wall. 8. Aortic atherosclerosis. Aortic Atherosclerosis (ICD10-I70.0). Electronically Signed   By: Kerby Moors M.D.   On: 07/02/2021 10:48    No images are attached to the encounter.   CMP Latest Ref Rng & Units 07/06/2021  Glucose 70 - 99 mg/dL 96  BUN 8 - 23 mg/dL 13  Creatinine 0.44 - 1.00 mg/dL 0.56  Sodium 135 - 145 mmol/L 137  Potassium 3.5 - 5.1 mmol/L 4.0  Chloride 98 - 111 mmol/L 106  CO2 22 - 32 mmol/L 26  Calcium 8.9 - 10.3 mg/dL 9.1  Total Protein 6.5 - 8.1 g/dL 6.2(L)  Total Bilirubin 0.3 - 1.2 mg/dL 0.5  Alkaline Phos 38 - 126 U/L 59  AST 15 - 41 U/L 22  ALT 0 - 44 U/L 19   CBC Latest Ref Rng & Units 07/06/2021  WBC 4.0 - 10.5 K/uL 3.0(L)  Hemoglobin 12.0 - 15.0 g/dL 10.1(L)  Hematocrit 36.0 - 46.0 % 31.7(L)  Platelets 150 - 400 K/uL 227     Observation/objective:appears in no acute distress over video visit today. Breathing is non labored  Assessment and plan:Patient is a 70 year old female with history of extensive stage small cell lung cancer with liver metastases here for on treatment assessment prior to cycle 9 of palliative Tecentriq.  Counts okay  to proceed with cycle 7 of palliative Tecentriq today and I will see her back in 3 weeks for cycle 8.  I have reviewed CT chest abdomen pelvis images independently and discussed findings with the patient which overall shows interval reduction in the size of large liver metastases as well as mediastinal adenopathy.  No evidence of progressive disease.  Plan is to continue Tecentriq until progression or toxicity.  Neoplasm related pain: Continue fentanyl patch and as needed oxycodone.  Patient does not wish to make any changes to her pain medication regimen at this time.  Follow-up instructions:as above  I  discussed the assessment and treatment plan with the patient. The patient was provided an opportunity to ask questions and all were answered. The patient agreed with the plan and demonstrated an understanding of the instructions.   The patient was advised to call back or seek an in-person evaluation if the symptoms worsen or if the condition fails to improve as anticipated.   Visit Diagnosis: 1. Encounter for antineoplastic immunotherapy   2. Small cell carcinoma of lung metastatic to liver ([place])   3. Neoplasm related pain     Dr. Randa Evens, MD, MPH [facility] Tel- [phone] 07/06/2021 5:27 PM

## 2021-07-07 ENCOUNTER — Telehealth: Payer: Self-pay | Admitting: *Deleted

## 2021-07-07 MED ORDER — FUROSEMIDE 20 MG PO TABS: 20.0000 mg | ORAL_TABLET | Freq: Every day | ORAL | 0 refills | Status: DC

## 2021-07-07 NOTE — Telephone Encounter (Signed)
Pt left message that is having bilateral feet swelling and would like to know MD recommendations. States she forgot to ask during visit yesterday.

## 2021-07-07 NOTE — Telephone Encounter (Signed)
Prescription sent in. Pt made aware. Will follow up with pt next week.

## 2021-07-07 NOTE — Telephone Encounter (Signed)
She can take lasix 20 mg daily for 4 days. Please f/u with her early next week if better

## 2021-07-08 ENCOUNTER — Encounter: Payer: Self-pay | Admitting: *Deleted

## 2021-07-08 NOTE — Progress Notes (Signed)
[facility] Work  Patient requested CSW call.  CSW contacted patient by phone to assess for needs and provide support.  Ms. Eilers reported no specific questions but wanted information on resources available.    Patient indicated she is currently living with a family member and has limited income.  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  She reported having difficulty getting to appointments now that she is no longer driving.  CSW made referral to [facility] Transportation Program to assist with future appointments.  She also indicated possible food insecurity.  CSW encouraged patient to utilize the [employer] and [employer].  Patient plans to pick up at her next appointment.     Gwinda Maine, LCSW  Clinical Social Worker [facility]

## 2021-07-13 ENCOUNTER — Other Ambulatory Visit: Payer: Self-pay | Admitting: *Deleted

## 2021-07-13 ENCOUNTER — Other Ambulatory Visit: Payer: Self-pay

## 2021-07-13 ENCOUNTER — Inpatient Hospital Stay (HOSPITAL_BASED_OUTPATIENT_CLINIC_OR_DEPARTMENT_OTHER): Payer: Medicare HMO | Admitting: Hospice and Palliative Medicine

## 2021-07-13 ENCOUNTER — Telehealth: Payer: Self-pay | Admitting: *Deleted

## 2021-07-13 VITALS — BP 131/72 | HR 56 | Temp 97.3°F | Resp 18 | Wt 136.0 lb

## 2021-07-13 DIAGNOSIS — C787 Secondary malignant neoplasm of liver and intrahepatic bile duct: Secondary | ICD-10-CM | POA: Diagnosis not present

## 2021-07-13 DIAGNOSIS — R609 Edema, unspecified: Secondary | ICD-10-CM

## 2021-07-13 DIAGNOSIS — G893 Neoplasm related pain (acute) (chronic): Secondary | ICD-10-CM | POA: Diagnosis not present

## 2021-07-13 DIAGNOSIS — R5383 Other fatigue: Secondary | ICD-10-CM | POA: Diagnosis not present

## 2021-07-13 DIAGNOSIS — C3490 Malignant neoplasm of unspecified part of unspecified bronchus or lung: Secondary | ICD-10-CM

## 2021-07-13 DIAGNOSIS — C7931 Secondary malignant neoplasm of brain: Secondary | ICD-10-CM | POA: Diagnosis not present

## 2021-07-13 DIAGNOSIS — C3412 Malignant neoplasm of upper lobe, left bronchus or lung: Secondary | ICD-10-CM | POA: Diagnosis not present

## 2021-07-13 DIAGNOSIS — K5903 Drug induced constipation: Secondary | ICD-10-CM | POA: Diagnosis not present

## 2021-07-13 DIAGNOSIS — Z5112 Encounter for antineoplastic immunotherapy: Secondary | ICD-10-CM | POA: Diagnosis not present

## 2021-07-13 DIAGNOSIS — R0789 Other chest pain: Secondary | ICD-10-CM | POA: Diagnosis not present

## 2021-07-13 DIAGNOSIS — R590 Localized enlarged lymph nodes: Secondary | ICD-10-CM | POA: Diagnosis not present

## 2021-07-13 MED ORDER — FUROSEMIDE 20 MG PO TABS: 20.0000 mg | ORAL_TABLET | Freq: Every day | ORAL | 0 refills | Status: DC

## 2021-07-13 MED ORDER — OXYCODONE HCL 30 MG PO TABS: 30.0000 mg | ORAL_TABLET | ORAL | 0 refills | Status: DC | PRN

## 2021-07-13 NOTE — Telephone Encounter (Signed)
Can she be seen at [facility] today?

## 2021-07-13 NOTE — Telephone Encounter (Signed)
Pt left message stating that her swelling in both legs continues despite take lasix.   Please advise.

## 2021-07-13 NOTE — Progress Notes (Signed)
Symptom Management [place]  [phone] [phone]  Patient Care Team: Leonel Ramsay, MD as PCP - General (Infectious Diseases) Telford Nab, RN as Oncology Nurse Navigator   Name of the patient: Doris Johnson  [id number]  1951-10-30   Date of visit: 07/13/21  Reason for Consult: Doris Johnson is a 70 y.o. female with multiple medical problems including extensive stage small cell lung cancer with liver and brain metastases.  Patient had multiple months of worsening midsternal pain and eventually had CT of the chest with contrast in October 2021 revealing a large mediastinal mass with severe compression of the left main pulmonary artery.  PET/CT revealed extensive disease involving the chest, neck, and liver.  Additionally, patient was found to have small brain mets on MRI.  She is s/p systemic chemo now on immunotherapy. Patient is s/p XRT.   Patient was seen virtually by Dr. Janese Banks on 07/06/2021 and seem to be in her usual state of health.  She received cycle 7 of palliative Tecentriq.  Patient called in to the clinic on 07/07/2021 reporting bilateral lower extremity edema.  She was started on furosemide 20 mg daily x4 days.  Patient presents to [facility] today for follow-up of lower extremity edema.  Patient states that the Lasix helped with the edema but that it is still present.  She denies shortness of breath or chest pain.  She is able to ambulate well without falls or changes in performance status.  She has no pain in her calfs.  Denies any neurologic complaints. Denies recent fevers or illnesses. Denies any easy bleeding or bruising. Reports good appetite and denies weight loss. Denies chest pain. Denies any nausea, vomiting, constipation, or diarrhea. Denies urinary complaints. Patient offers no further specific complaints today.  PAST MEDICAL HISTORY: Past Medical History:  Diagnosis Date   Cancer ([place])    Cataract     Diabetes mellitus without complication ([place])    Hyperlipidemia    Hypertension    Hypothyroidism    doctor's keeping an eye on thyroid     PAST SURGICAL HISTORY:  Past Surgical History:  Procedure Laterality Date   ABDOMINAL HYSTERECTOMY     PORTA CATH INSERTION N/A 11/30/2020   Procedure: PORTA CATH INSERTION;  Surgeon: Algernon Huxley, MD;  Location: [facility];  Service: Cardiovascular;  Laterality: N/A;   VIDEO BRONCHOSCOPY WITH ENDOBRONCHIAL NAVIGATION N/A 10/21/2020   Procedure: VIDEO BRONCHOSCOPY WITH ENDOBRONCHIAL NAVIGATION;  Surgeon: Ottie Glazier, MD;  Location: [facility];  Service: Thoracic;  Laterality: N/A;   VIDEO BRONCHOSCOPY WITH ENDOBRONCHIAL ULTRASOUND N/A 10/21/2020   Procedure: VIDEO BRONCHOSCOPY WITH ENDOBRONCHIAL ULTRASOUND;  Surgeon: Ottie Glazier, MD;  Location: [facility];  Service: Thoracic;  Laterality: N/A;    HEMATOLOGY/ONCOLOGY HISTORY:  Oncology History  Small cell carcinoma of lung metastatic to liver ([place])  10/30/2020 Initial Diagnosis   Small cell lung cancer ([place])   11/12/2020 Cancer Staging   Staging form: Lung, AJCC 8th Edition - Clinical: Stage IVB (cT4, cNX, pM1c) - Signed by Sindy Guadeloupe, MD on 11/12/2020   12/01/2020 -  Chemotherapy    Patient is on Treatment Plan: LUNG SCLC CARBOPLATIN + ETOPOSIDE + ATEZOLIZUMAB INDUCTION Q21D / ATEZOLIZUMAB MAINTENANCE Q21D         ALLERGIES:  has No Known Allergies.  MEDICATIONS:  Current Outpatient Medications  Medication Sig Dispense Refill   docusate sodium (COLACE) 100 MG capsule Take 200 mg by mouth at bedtime. (Patient not taking: No  sig reported)     fentaNYL (DURAGESIC) 12 MCG/HR Place 1 patch onto the skin every 3 (three) days. 10 patch 0   fluticasone (FLONASE) 50 MCG/ACT nasal spray Place 2 sprays into both nostrils daily. (Patient not taking: Reported on 07/06/2021) 16 g 5   furosemide (LASIX) 20 MG tablet Take 1 tablet (20 mg total) by mouth daily. (Patient not taking:  Reported on 07/13/2021) 4 tablet 0   lidocaine-prilocaine (EMLA) cream Apply 1 application topically as needed. Apply small amount to port site at least 1 hour prior to it being accessed, cover with plastic wrap 30 g 1   LORazepam (ATIVAN) 0.5 MG tablet Take 1 tablet (0.5 mg total) by mouth every 6 (six) hours as needed (Nausea or vomiting). (Patient not taking: No sig reported) 30 tablet 0   mirtazapine (REMERON) 15 MG tablet Take 15 mg by mouth at bedtime. (Patient not taking: Reported on 07/06/2021)     ondansetron (ZOFRAN) 8 MG tablet Take 1 tablet (8 mg total) by mouth 2 (two) times daily as needed for refractory nausea / vomiting. Start on day 3 after carboplatin chemo. (Patient not taking: No sig reported) 30 tablet 1   oxycodone (ROXICODONE) 30 MG immediate release tablet Take 1 tablet (30 mg total) by mouth every 4 (four) hours as needed for pain. 180 tablet 0   polyethylene glycol (MIRALAX / GLYCOLAX) 17 g packet Take 17 g by mouth daily. (Patient not taking: Reported on 07/06/2021)     potassium chloride SA (KLOR-CON) 20 MEQ tablet Take 2 tablets (40 mEq total) by mouth daily. 7 tablet 0   prochlorperazine (COMPAZINE) 10 MG tablet Take 1 tablet (10 mg total) by mouth every 6 (six) hours as needed (Nausea or vomiting). (Patient not taking: No sig reported) 30 tablet 1   No current facility-administered medications for this visit.   Facility-Administered Medications Ordered in Other Visits  Medication Dose Route Frequency Provider Last Rate Last Admin   sodium chloride flush (NS) 0.9 % injection 10 mL  10 mL Intravenous PRN Sindy Guadeloupe, MD   10 mL at 12/15/20 0850    VITAL SIGNS: BP 131/72   Pulse (!) 56   Temp (!) 97.3 F (36.3 C) (Tympanic)   Resp 18   Wt 136 lb (61.7 kg)   SpO2 100%   BMI 21.95 kg/m  Filed Weights   07/13/21 1416  Weight: 136 lb (61.7 kg)    Estimated body mass index is 21.95 kg/m as calculated from the following:   Height as of 05/25/21: 5\' 6"  (1.676  m).   Weight as of this encounter: 136 lb (61.7 kg).  LABS: CBC:    Component Value Date/Time   WBC 3.0 (L) 07/06/2021 0832   HGB 10.1 (L) 07/06/2021 0832   HCT 31.7 (L) 07/06/2021 0832   PLT 227 07/06/2021 0832   MCV 88.8 07/06/2021 0832   NEUTROABS 2.1 07/06/2021 0832   LYMPHSABS 0.5 (L) 07/06/2021 0832   MONOABS 0.4 07/06/2021 0832   EOSABS 0.1 07/06/2021 0832   BASOSABS 0.0 07/06/2021 0832   Comprehensive Metabolic Panel:    Component Value Date/Time   NA 137 07/06/2021 0832   NA 141 03/09/2015 0000   K 4.0 07/06/2021 0832   CL 106 07/06/2021 0832   CO2 26 07/06/2021 0832   BUN 13 07/06/2021 0832   BUN 11 03/09/2015 0000   CREATININE 0.56 07/06/2021 0832   GLUCOSE 96 07/06/2021 0832   CALCIUM 9.1 07/06/2021 1937  AST 22 07/06/2021 0832   ALT 19 07/06/2021 0832   ALKPHOS 59 07/06/2021 0832   BILITOT 0.5 07/06/2021 0832   PROT 6.2 (L) 07/06/2021 0832   ALBUMIN 3.4 (L) 07/06/2021 0832    RADIOGRAPHIC STUDIES: NM Bone Scan Whole Body  Result Date: 07/03/2021 CLINICAL DATA:  Small cell lung cancer, sternal chest pain EXAM: NUCLEAR MEDICINE WHOLE BODY BONE SCAN TECHNIQUE: Whole body anterior and posterior images were obtained approximately 3 hours after intravenous injection of radiopharmaceutical. RADIOPHARMACEUTICALS:  19.48 mCi Technetium-52m MDP IV COMPARISON:  None. Findings are correlated with CT examination performed concurrently FINDINGS: Uptake within the shoulders, knees and midthoracic spine is likely degenerative in nature. No focal uptake of radiotracer to suggest osseous metastatic disease. Normal soft tissue distribution. Normal uptake and excretion within the kidneys and bladder. IMPRESSION: No osseous metastatic disease. Electronically Signed   By: Fidela Salisbury M.D.   On: 07/03/2021 00:06   CT CHEST ABDOMEN PELVIS W CONTRAST  Result Date: 07/02/2021 CLINICAL DATA:  Restaging small cell lung cancer. EXAM: CT CHEST, ABDOMEN, AND PELVIS WITH CONTRAST  TECHNIQUE: Multidetector CT imaging of the chest, abdomen and pelvis was performed following the standard protocol during bolus administration of intravenous contrast. CONTRAST:  86mL OMNIPAQUE IOHEXOL 350 MG/ML SOLN COMPARISON:  03/16/2021 FINDINGS: CT CHEST FINDINGS Cardiovascular: Normal heart size. No pericardial effusion. Mild aortic atherosclerotic calcifications noted. Mediastinal tumor encases and narrows the left main pulmonary artery, image 20/2. Mediastinum/Nodes: Enlarged and heterogeneous thyroid gland is again noted and appears unchanged from previous exam In the setting of significant comorbidities or limited life expectancy, no follow-up recommended (ref: J Am Coll Radiol. 2015 Feb;12(2): 143-50).The trachea appears patent and is midline. Normal appearance of the esophagus. Large, heterogeneous mediastinal mass centered in the AP window lymph node is again noted. As noted previously this encases the aortic arch and left main pulmonary artery. This measures 6.6 x 6.5 by 5.0 cm (volume = 110 cm^3), image 18/2 and image 59/5. On the previous exam this measured 7.1 x 6.2 by 6.0 cm (volume = 140 cm^3). Left pre-vascular lymph node within the superior mediastinum measures 1.6 cm, image 11/2. Previously 2.1 cm. Lungs/Pleura: There is a small left effusion which appears decreased in volume from previous exam. Bandlike area of subsegmental scar is identified within the anterior left upper lobe and appears similar to previous exam. New patchy areas of ground-glass densities are scattered within the left upper lobe are identified compatible with a nonspecific pneumonitis. New nodular density within the superior segment of left lower lobe measures 6 mm, image 47/4. Nonspecific. Small solid nodule within the left apex measures 6 mm, image 23/4. Unchanged from previous exam. New patchy area of ground-glass and airspace density is identified within the anteromedial right upper lobe, image 36/4. Small subpleural  nodule within the periphery of the right middle lobe is unchanged measuring 4 mm, image 63/4. Right lower lobe subpleural nodule along the major fissure of the right lung is also unchanged measuring 3 mm, image 65/4. Musculoskeletal: No chest wall mass or suspicious bone lesions identified. CT ABDOMEN PELVIS FINDINGS Hepatobiliary: Mass within segment 4 B measures 4.4 x 4.2 cm, image 48/2. Previously 6.0 x 6.9 cm. No new suspicious liver lesions. There is a stable, benign appearing 1.8 cm enhancing lesion within the posteromedial right hepatic lobe, image 42/2. This is favored to represent a liver hemangioma. No signs of gallbladder inflammation or common bile duct dilatation. Pancreas: Unremarkable. No pancreatic ductal dilatation or surrounding inflammatory changes. Spleen:  Normal in size without focal abnormality. Adrenals/Urinary Tract: Adrenal glands are unremarkable. Kidneys are normal, without renal calculi, focal lesion, or hydronephrosis. Bladder is unremarkable. Stomach/Bowel: Stomach appears normal. No bowel wall thickening, inflammation, or distension. Moderate stool burden noted throughout the colon. Vascular/Lymphatic: No aneurysm. No significant abdominopelvic adenopathy. Reproductive: Status post hysterectomy.  No adnexal mass identified. Other: Diffuse subcutaneous soft tissue edema is identified throughout the imaged portions of the body wall. No significant free fluid or fluid collections within the abdomen or pelvis. Musculoskeletal: Lumbar spondylosis identified with anterolisthesis of L4 on L5. Marked degenerative disc disease noted at L5-S1. No acute or suspicious osseous findings identified. IMPRESSION: 1. Interval response to therapy. The dominant mediastinal mass centered in the AP window lymph node exhibits mild interval decrease in size as described above. There is continued significant encasement and marked narrowing of the left main pulmonary artery. 2. Decrease in size of left  pre-vascular superior mediastinal lymph node. 3. Decrease in size of left lobe of liver metastasis. 4. New patchy areas of ground-glass and airspace density within the left upper lobe and anteromedial right upper lobe are identified compatible with a nonspecific pneumonitis. Attention on follow-up imaging is advised. 5. Nonspecific pulmonary nodule within the superior segment of the left lower lobe is identified measuring 6 mm. Attention on future surveillance imaging is advised. 6. Decrease in volume of left pleural effusion. 7. Diffuse subcutaneous soft tissue edema throughout the imaged portions of the body wall. 8. Aortic atherosclerosis. Aortic Atherosclerosis (ICD10-I70.0). Electronically Signed   By: Kerby Moors M.D.   On: 07/02/2021 10:48    PERFORMANCE STATUS (ECOG) : 1 - Symptomatic but completely ambulatory  Review of Systems Unless otherwise noted, a complete review of systems is negative.  Physical Exam General: NAD Cardiovascular: regular rate and rhythm Pulmonary: clear ant fields Abdomen: soft, nontender, + bowel sounds GU: no suprapubic tenderness Extremities: 2+ BLE edema, no joint deformities Skin: no rashes Neurological: Weakness but otherwise nonfocal  Assessment and Plan- Patient is a 70 y.o. female with multiple medical problems including extensive stage small cell lung cancer with liver and brain metastases status post chemo/XRT on maintenance Tecentriq who presents to [facility] for evaluation of bilateral lower extremity edema.  Lower extremity edema -low suspicion for DVT.  Negative Homans' sign bilaterally.  Serum albumin slightly low.  TSH recently checked and was normal.  CT of the chest, abdomen, and pelvis on 07/01/2021 revealed interval improvement in disease.  Patient's weight is up significantly (15 pounds) over the past 3-4 weeks.  Patient does endorse eating more with likely high salt foods.  She does endorse improvement on Lasix but only had a 4-day course.   Discussed with Dr. Janese Banks and will restart Lasix daily for another week.  Continue K-Dur.  We will also send for echo.  Case and plan discussed with Dr. Janese Banks.  Patient follow-up as previously scheduled on 07/28/2019 for labs and to see Dr. Janese Banks  Patient expressed understanding and was in agreement with this plan. She also understands that She can call clinic at any time with any questions, concerns, or complaints.   Thank you for allowing me to participate in the care of this very pleasant patient.   Time Total: 20 minutes  Visit consisted of counseling and education dealing with the complex and emotionally intense issues of symptom management and palliative care in the setting of serious and potentially life-threatening illness.Greater than 50%  of this time was spent counseling and coordinating care related to  the above assessment and plan.  Signed by: Altha Harm, PhD, NP-C

## 2021-07-13 NOTE — Progress Notes (Signed)
Pt has completed a round of lasix for pedal edema, however, states that edema is still present.

## 2021-07-13 NOTE — Telephone Encounter (Signed)
Pt scheduled for [facility] visit today with Josh at 2pm. She is aware.

## 2021-07-16 ENCOUNTER — Ambulatory Visit: Payer: Medicare HMO | Admitting: Radiation Oncology

## 2021-07-21 ENCOUNTER — Other Ambulatory Visit
Admission: RE | Admit: 2021-07-21 | Discharge: 2021-07-21 | Disposition: A | Discharge status: Home / Self Care | Payer: Medicare HMO | Source: Ambulatory Visit | Attending: Infectious Diseases | Admitting: Infectious Diseases

## 2021-07-21 DIAGNOSIS — C3490 Malignant neoplasm of unspecified part of unspecified bronchus or lung: Secondary | ICD-10-CM | POA: Insufficient documentation

## 2021-07-21 DIAGNOSIS — R600 Localized edema: Secondary | ICD-10-CM | POA: Diagnosis not present

## 2021-07-21 DIAGNOSIS — C787 Secondary malignant neoplasm of liver and intrahepatic bile duct: Secondary | ICD-10-CM | POA: Diagnosis not present

## 2021-07-21 DIAGNOSIS — E119 Type 2 diabetes mellitus without complications: Secondary | ICD-10-CM | POA: Diagnosis not present

## 2021-07-21 DIAGNOSIS — K5900 Constipation, unspecified: Secondary | ICD-10-CM | POA: Diagnosis not present

## 2021-07-21 LAB — BRAIN NATRIURETIC PEPTIDE: B Natriuretic Peptide: 76.8 pg/mL (ref 0.0–100.0)

## 2021-07-27 ENCOUNTER — Encounter: Payer: Self-pay | Admitting: Oncology

## 2021-07-27 ENCOUNTER — Other Ambulatory Visit: Payer: Self-pay

## 2021-07-27 ENCOUNTER — Inpatient Hospital Stay: Payer: Medicare HMO

## 2021-07-27 ENCOUNTER — Ambulatory Visit
Admission: RE | Admit: 2021-07-27 | Discharge: 2021-07-27 | Disposition: A | Discharge status: Home / Self Care | Payer: Medicare HMO | Source: Ambulatory Visit | Attending: Radiation Oncology | Admitting: Radiation Oncology

## 2021-07-27 ENCOUNTER — Encounter: Payer: Self-pay | Admitting: Radiation Oncology

## 2021-07-27 ENCOUNTER — Inpatient Hospital Stay: Payer: Medicare HMO | Attending: Hospice and Palliative Medicine | Admitting: Oncology

## 2021-07-27 VITALS — BP 141/75 | HR 66 | Temp 98.1°F | Wt 143.2 lb

## 2021-07-27 VITALS — BP 162/98 | HR 63 | Temp 97.3°F | Wt 143.0 lb

## 2021-07-27 DIAGNOSIS — C787 Secondary malignant neoplasm of liver and intrahepatic bile duct: Secondary | ICD-10-CM | POA: Insufficient documentation

## 2021-07-27 DIAGNOSIS — Z79899 Other long term (current) drug therapy: Secondary | ICD-10-CM | POA: Diagnosis not present

## 2021-07-27 DIAGNOSIS — G893 Neoplasm related pain (acute) (chronic): Secondary | ICD-10-CM | POA: Diagnosis not present

## 2021-07-27 DIAGNOSIS — C7931 Secondary malignant neoplasm of brain: Secondary | ICD-10-CM | POA: Diagnosis not present

## 2021-07-27 DIAGNOSIS — C3490 Malignant neoplasm of unspecified part of unspecified bronchus or lung: Secondary | ICD-10-CM | POA: Insufficient documentation

## 2021-07-27 DIAGNOSIS — M4316 Spondylolisthesis, lumbar region: Secondary | ICD-10-CM | POA: Insufficient documentation

## 2021-07-27 DIAGNOSIS — I700 Atherosclerosis of aorta: Secondary | ICD-10-CM | POA: Diagnosis not present

## 2021-07-27 DIAGNOSIS — M7989 Other specified soft tissue disorders: Secondary | ICD-10-CM | POA: Diagnosis not present

## 2021-07-27 DIAGNOSIS — R600 Localized edema: Secondary | ICD-10-CM | POA: Insufficient documentation

## 2021-07-27 DIAGNOSIS — Z803 Family history of malignant neoplasm of breast: Secondary | ICD-10-CM | POA: Diagnosis not present

## 2021-07-27 DIAGNOSIS — Z95828 Presence of other vascular implants and grafts: Secondary | ICD-10-CM

## 2021-07-27 DIAGNOSIS — Z08 Encounter for follow-up examination after completed treatment for malignant neoplasm: Secondary | ICD-10-CM | POA: Diagnosis not present

## 2021-07-27 DIAGNOSIS — Z923 Personal history of irradiation: Secondary | ICD-10-CM | POA: Diagnosis not present

## 2021-07-27 DIAGNOSIS — R5383 Other fatigue: Secondary | ICD-10-CM | POA: Insufficient documentation

## 2021-07-27 DIAGNOSIS — M5137 Other intervertebral disc degeneration, lumbosacral region: Secondary | ICD-10-CM | POA: Insufficient documentation

## 2021-07-27 DIAGNOSIS — J90 Pleural effusion, not elsewhere classified: Secondary | ICD-10-CM | POA: Insufficient documentation

## 2021-07-27 DIAGNOSIS — C778 Secondary and unspecified malignant neoplasm of lymph nodes of multiple regions: Secondary | ICD-10-CM | POA: Diagnosis not present

## 2021-07-27 DIAGNOSIS — C771 Secondary and unspecified malignant neoplasm of intrathoracic lymph nodes: Secondary | ICD-10-CM | POA: Diagnosis not present

## 2021-07-27 DIAGNOSIS — J984 Other disorders of lung: Secondary | ICD-10-CM | POA: Insufficient documentation

## 2021-07-27 DIAGNOSIS — D1803 Hemangioma of intra-abdominal structures: Secondary | ICD-10-CM | POA: Diagnosis not present

## 2021-07-27 DIAGNOSIS — Z8249 Family history of ischemic heart disease and other diseases of the circulatory system: Secondary | ICD-10-CM | POA: Diagnosis not present

## 2021-07-27 DIAGNOSIS — M47816 Spondylosis without myelopathy or radiculopathy, lumbar region: Secondary | ICD-10-CM | POA: Diagnosis not present

## 2021-07-27 DIAGNOSIS — Z5941 Food insecurity: Secondary | ICD-10-CM | POA: Insufficient documentation

## 2021-07-27 DIAGNOSIS — Z8349 Family history of other endocrine, nutritional and metabolic diseases: Secondary | ICD-10-CM | POA: Insufficient documentation

## 2021-07-27 DIAGNOSIS — Z5112 Encounter for antineoplastic immunotherapy: Secondary | ICD-10-CM | POA: Diagnosis not present

## 2021-07-27 DIAGNOSIS — C3412 Malignant neoplasm of upper lobe, left bronchus or lung: Secondary | ICD-10-CM | POA: Insufficient documentation

## 2021-07-27 LAB — COMPREHENSIVE METABOLIC PANEL
ALT: 14 U/L (ref 0–44)
AST: 15 U/L (ref 15–41)
Albumin: 3.4 g/dL — ABNORMAL LOW (ref 3.5–5.0)
Alkaline Phosphatase: 68 U/L (ref 38–126)
Anion gap: 6 (ref 5–15)
BUN: 15 mg/dL (ref 8–23)
CO2: 27 mmol/L (ref 22–32)
Calcium: 9.2 mg/dL (ref 8.9–10.3)
Chloride: 104 mmol/L (ref 98–111)
Creatinine, Ser: 0.51 mg/dL (ref 0.44–1.00)
GFR, Estimated: >60 mL/min (ref >60)
Glucose, Bld: 92 mg/dL (ref 70–99)
Potassium: 3.5 mmol/L (ref 3.5–5.1)
Sodium: 137 mmol/L (ref 135–145)
Total Bilirubin: 0.3 mg/dL (ref 0.3–1.2)
Total Protein: 6.4 g/dL — ABNORMAL LOW (ref 6.5–8.1)

## 2021-07-27 LAB — CBC WITH DIFFERENTIAL/PLATELET
Abs Immature Granulocytes: 0.01 10*3/uL (ref 0.00–0.07)
Basophils Absolute: 0 10*3/uL (ref 0.0–0.1)
Basophils Relative: 0 %
Eosinophils Absolute: 0.1 10*3/uL (ref 0.0–0.5)
Eosinophils Relative: 4 %
HCT: 32.6 % — ABNORMAL LOW (ref 36.0–46.0)
Hemoglobin: 10.5 g/dL — ABNORMAL LOW (ref 12.0–15.0)
Immature Granulocytes: 0 %
Lymphocytes Relative: 16 %
Lymphs Abs: 0.6 10*3/uL — ABNORMAL LOW (ref 0.7–4.0)
MCH: 28.7 pg (ref 26.0–34.0)
MCHC: 32.2 g/dL (ref 30.0–36.0)
MCV: 89.1 fL (ref 80.0–100.0)
Monocytes Absolute: 0.5 10*3/uL (ref 0.1–1.0)
Monocytes Relative: 13 %
Neutro Abs: 2.5 10*3/uL (ref 1.7–7.7)
Neutrophils Relative %: 67 %
Platelets: 228 10*3/uL (ref 150–400)
RBC: 3.66 MIL/uL — ABNORMAL LOW (ref 3.87–5.11)
RDW: 15.3 % (ref 11.5–15.5)
WBC: 3.7 10*3/uL — ABNORMAL LOW (ref 4.0–10.5)
nRBC: 0 % (ref 0.0–0.2)

## 2021-07-27 MED ORDER — FENTANYL 25 MCG/HR TD PT72: 1.0000 | MEDICATED_PATCH | TRANSDERMAL | 0 refills | Status: AC

## 2021-07-27 MED ORDER — HEPARIN SOD (PORK) LOCK FLUSH 100 UNIT/ML IV SOLN
500.0000 [IU] | Freq: Once | INTRAVENOUS | Status: AC | PRN
  Filled 2021-07-27: qty 5

## 2021-07-27 MED ORDER — HEPARIN SOD (PORK) LOCK FLUSH 100 UNIT/ML IV SOLN
INTRAVENOUS | Status: AC
  Administered 2021-07-27: 500 [IU]
  Filled 2021-07-27: qty 5

## 2021-07-27 MED ORDER — SODIUM CHLORIDE 0.9 % IV SOLN
Freq: Once | INTRAVENOUS | Status: AC
  Filled 2021-07-27: qty 250

## 2021-07-27 MED ORDER — SODIUM CHLORIDE 0.9 % IV SOLN
1200.0000 mg | Freq: Once | INTRAVENOUS | Status: AC
  Administered 2021-07-27: 1200 mg via INTRAVENOUS
  Filled 2021-07-27: qty 20

## 2021-07-27 MED ORDER — SODIUM CHLORIDE 0.9% FLUSH
10.0000 mL | Freq: Once | INTRAVENOUS | Status: DC
  Filled 2021-07-27: qty 10

## 2021-07-27 NOTE — Progress Notes (Signed)
Radiation Oncology Follow up Note  Name: Doris Johnson   Date:   07/27/2021 MRN:  [id number] DOB: June 10, 1951    This 70 y.o. female presents to the clinic today for 65-month follow-up status post.  Palliative radiation therapy to her mediastinum and patient with known stage IV small cell lung cancer with both liver and brain metastasis  REFERRING PROVIDER: Leonel Ramsay, MD  HPI: Patient is a 70 year old female now out 5 months having completed palliative radiation therapy to her mediastinum for extensive stage small cell lung cancer..  She is seen today and is doing fairly well.  She is without complaint.  She recently had a CT scan of abdomen pelvis and chest showing interval response to therapy with AP window lymph nodes exhibiting mild interval decrease in size.  She has a decrease in size of left prevascular superior mediastinal lymph nodes.  She also had some decreased size and left lobe of liver metastasis.  Bone scan recently showed no evidence of osseous metastatic disease.  She is currently on her cycle 8 of maintenance Tecentriq.  She is having some lower extremity edema and is scheduled for an echocardiogram later this week  COMPLICATIONS OF TREATMENT: none  FOLLOW UP COMPLIANCE: keeps appointments   PHYSICAL EXAM:  BP (!) 162/98   Pulse 63   Temp (!) 97.3 F (36.3 C) (Tympanic)   Wt 143 lb (64.9 kg)   BMI 23.08 kg/m  Well-developed well-nourished patient in NAD. HEENT reveals PERLA, EOMI, discs not visualized.  Oral cavity is clear. No oral mucosal lesions are identified. Neck is clear without evidence of cervical or supraclavicular adenopathy. Lungs are clear to A&P. Cardiac examination is essentially unremarkable with regular rate and rhythm without murmur rub or thrill. Abdomen is benign with no organomegaly or masses noted. Motor sensory and DTR levels are equal and symmetric in the upper and lower extremities. Cranial nerves II through XII are grossly intact.  Proprioception is intact. No peripheral adenopathy or edema is identified. No motor or sensory levels are noted. Crude visual fields are within normal range.  RADIOLOGY RESULTS: CT scans and bone scan reviewed compatible with above-stated findings  PLAN: Present time she is stable currently on maintenance Tecentriq.  On see no further role for radiation therapy at this time.  I have asked to see her back in 6 months for follow-up.  She continues close follow-up care and treatment through medical oncology.  Patient knows to call with any concerns.  I would like to take this opportunity to thank you for allowing me to participate in the care of your patient.Noreene Filbert, MD

## 2021-07-27 NOTE — Progress Notes (Signed)
Nutrition Follow-up:  Patient with small cell lung cancer with metastatic disease.  Patient receiving tecentriq.    Met with patient during infusion.  Patient says that appetite is increased some.  Reports that she is eating her 3 meals per day and drinking 2 ensure original shakes daily (does not like plus).  Snacks on fruit.      Medications: lasix  Labs: reviewed  Anthropometrics:   Weight 143 lb (bilateral leg edema)  123 lb 6.4 oz on 8/2 118 lb on 7/12 121 lb 14.4 oz on 6/21 123 lb on 5/31 124 lb on 3/22 138 lb on 2/8 147 lb on 1/8   NUTRITION DIAGNOSIS: Inadequate oral intake slight improvement    INTERVENTION:  Coupons for ensure given. Continue 2 shakes per day Encouraged patient to eat protein food at every meal and be mindful about sodium due to swelling.     MONITORING, EVALUATION, GOAL: weight trends, intake   NEXT VISIT: Tuesday, October 25 during infusion  Nasirah Sachs B. Zenia Resides, [place], [place] Registered Dietitian [phone] (mobile)

## 2021-07-27 NOTE — Progress Notes (Signed)
Hematology/Oncology Consult note [facility]  Telephone:([phone] [phone]  Patient Care Team: Leonel Ramsay, MD as PCP - General (Infectious Diseases) Telford Nab, RN as Oncology Nurse Navigator   Name of the patient: Doris Johnson  [id number]  [birth year]   Date of visit: 07/27/21  Diagnosis-extensive stage small cell lung cancer with liver metastases  Chief complaint/ Reason for visit-on treatment assessment prior to cycle 8 of maintenance Tecentriq  Heme/Onc history:  Patient is a 70 year old female with a remote history of smoking in her teenage years and exposure to passive smoking.  She has been having ongoing midsternal pain which has been growing since the last 6 to 7 months.  She had been to urgent care as well in the past.  She finally had a CT chest with contrast done in October 2021 which showed a large mass in the left side of the mediastinum measuring 6.7 x 6.2 x 6.1 cm causing severe compression of the left main pulmonary artery and around the aortic arch in the left subclavian artery.  Enlarged thyroid gland.  Left upper lobe nodule 1 x 0.7 cm.  She then underwent bronchoscopy with Dr.Aleskerov left upper lobe biopsy was nondiagnostic.  However station 4, 7 and 10 L lymph nodes were consistent with metastatic small cell carcinoma.   PET CT scan showed enlarged level 3 cervical lymph node 2.2 cm that was hypermetabolic with an SUV of 9.7.  Large central 7.3 cm AP window mass.  5.7 cm left liver mass.  MRI brain with and without contrast showed 2 small foci of enhancement in the right cerebellar hemisphere and right temporal lobe without mass-effect or edema as well concerning for brain metastases   Patient had 2 cycles of carbo etoposide chemotherapy along with Tecentriq and subsequent scan showed no significant improvement in the primary lung mass or liver mass.  She received radiation treatment to her primary lung mass and also  seen by [facility] for second opinion.  Her pathology was reviewed at [facility] and reported as possible neuroendocrine neoplasm But stated that small cell carcinoma cannot be excluded but not definitely diagnostic for it.  However [facility] pathology feels that this is consistent with small cell lung cancer.  She has completed 4 cycles of carbo etoposide Tecentriq chemotherapy and is currently on maintenance Tecentriq.  Repeat liver biopsy was also performed and was consistent with small cell lung cancer   Interval history-continues to have bilateral leg swelling.  She has an echocardiogram coming up this week.  She is currently using the Lasix 20 mg daily and is also using bilateral compression stockings.  States that her pain is not presently well controlled with fentanyl patch and as needed oxycodone.  She is still using oxycodone about 4-5 times a day.  ECOG PS- 1 Pain scale- 5 Opioid associated constipation- no  Review of systems- Review of Systems  Constitutional:  Positive for malaise/fatigue. Negative for chills, fever and weight loss.  HENT:  Negative for congestion, ear discharge and nosebleeds.   Eyes:  Negative for blurred vision.  Respiratory:  Negative for cough, hemoptysis, sputum production, shortness of breath and wheezing.        Midsternal chest wall pain  Cardiovascular:  Positive for leg swelling. Negative for chest pain, palpitations, orthopnea and claudication.  Gastrointestinal:  Negative for abdominal pain, blood in stool, constipation, diarrhea, heartburn, melena, nausea and vomiting.  Genitourinary:  Negative for dysuria, flank pain, frequency, hematuria and urgency.  Musculoskeletal:  Negative for back pain, joint pain and myalgias.  Skin:  Negative for rash.  Neurological:  Negative for dizziness, tingling, focal weakness, seizures, weakness and headaches.  Endo/Heme/Allergies:  Does not bruise/bleed easily.  Psychiatric/Behavioral:  Negative for depression and suicidal ideas. The  patient does not have insomnia.      No Known Allergies   Past Medical History:  Diagnosis Date   Cancer ([employer])    Cataract    Diabetes mellitus without complication ([place])    Hyperlipidemia    Hypertension    Hypothyroidism    doctor's keeping an eye on thyroid      Past Surgical History:  Procedure Laterality Date   ABDOMINAL HYSTERECTOMY     PORTA CATH INSERTION N/A 11/30/2020   Procedure: PORTA CATH INSERTION;  Surgeon: Algernon Huxley, MD;  Location: [place] [facility];  Service: Cardiovascular;  Laterality: N/A;   VIDEO BRONCHOSCOPY WITH ENDOBRONCHIAL NAVIGATION N/A 10/21/2020   Procedure: VIDEO BRONCHOSCOPY WITH ENDOBRONCHIAL NAVIGATION;  Surgeon: Ottie Glazier, MD;  Location: [facility] ORS;  Service: Thoracic;  Laterality: N/A;   VIDEO BRONCHOSCOPY WITH ENDOBRONCHIAL ULTRASOUND N/A 10/21/2020   Procedure: VIDEO BRONCHOSCOPY WITH ENDOBRONCHIAL ULTRASOUND;  Surgeon: Ottie Glazier, MD;  Location: [facility] OR[facility];  Service: Thoracic;  Laterality: N/A;    Social History   Socioeconomic History   Marital status: Single    Spouse name: Not on file   Number of children: Not on file   Years of education: Not on file   Highest education level: Not on file  Occupational History   Not on file  Tobacco Use   Smoking status: Never   Smokeless tobacco: Never  Vaping Use   Vaping Use: Never used  Substance and Sexual Activity   Alcohol use: No   Drug use: No   Sexual activity: Not Currently  Other Topics Concern   Not on file  Social History Narrative   Not on file   Social Determinants of Health   Financial Resource Strain: Not on file  Food Insecurity: Food Insecurity Present   Worried About Running Out of Food in the Last Year: Sometimes true   Arboriculturist in the Last Year: Sometimes true  Transportation Needs: Unmet Transportation Needs   Lack of Transportation (Medical): Yes   Lack of Transportation (Non-Medical): Yes  Physical Activity: Not on file  Stress: Not on  file  Social Connections: Not on file  Intimate Partner Violence: Not on file    Family History  Problem Relation Age of Onset   Cancer Mother        breast   Hypertension Mother    Breast cancer Mother 38   Heart disease Father    Hyperlipidemia Brother    Heart disease Brother      Current Outpatient Medications:    furosemide (LASIX) 20 MG tablet, Take 1 tablet (20 mg total) by mouth daily., Disp: 7 tablet, Rfl: 0   lidocaine-prilocaine (EMLA) cream, Apply 1 application topically as needed. Apply small amount to port site at least 1 hour prior to it being accessed, cover with plastic wrap, Disp: 30 g, Rfl: 1   oxycodone (ROXICODONE) 30 MG immediate release tablet, Take 1 tablet (30 mg total) by mouth every 4 (four) hours as needed for pain., Disp: 180 tablet, Rfl: 0   potassium chloride SA (KLOR-CON) 20 MEQ tablet, Take 2 tablets (40 mEq total) by mouth daily., Disp: 7 tablet, Rfl: 0   docusate sodium (COLACE) 100 MG capsule, Take 200  mg by mouth at bedtime. (Patient not taking: No sig reported), Disp: , Rfl:    fentaNYL (DURAGESIC) 25 MCG/HR, Place 1 patch onto the skin every 3 (three) days., Disp: 10 patch, Rfl: 0   fluticasone (FLONASE) 50 MCG/ACT nasal spray, Place 2 sprays into both nostrils daily., Disp: 16 g, Rfl: 5   LORazepam (ATIVAN) 0.5 MG tablet, Take 1 tablet (0.5 mg total) by mouth every 6 (six) hours as needed (Nausea or vomiting). (Patient not taking: No sig reported), Disp: 30 tablet, Rfl: 0   mirtazapine (REMERON) 15 MG tablet, Take 15 mg by mouth at bedtime. (Patient not taking: No sig reported), Disp: , Rfl:    ondansetron (ZOFRAN) 8 MG tablet, Take 1 tablet (8 mg total) by mouth 2 (two) times daily as needed for refractory nausea / vomiting. Start on day 3 after carboplatin chemo. (Patient not taking: No sig reported), Disp: 30 tablet, Rfl: 1   polyethylene glycol (MIRALAX / GLYCOLAX) 17 g packet, Take 17 g by mouth daily., Disp: , Rfl:    prochlorperazine  (COMPAZINE) 10 MG tablet, Take 1 tablet (10 mg total) by mouth every 6 (six) hours as needed (Nausea or vomiting). (Patient not taking: No sig reported), Disp: 30 tablet, Rfl: 1 No current facility-administered medications for this visit.  Facility-Administered Medications Ordered in Other Visits:    sodium chloride flush (NS) 0.9 % injection 10 mL, 10 mL, Intravenous, PRN, Sindy Guadeloupe, MD, 10 mL at 12/15/20 0850  Physical exam:  Vitals:   07/27/21 0903  BP: (!) 141/75  Pulse: 66  Temp: 98.1 F (36.7 C)  SpO2: 100%  Weight: 143 lb 3 oz (64.9 kg)   Physical Exam Constitutional:      General: She is not in acute distress. Cardiovascular:     Rate and Rhythm: Normal rate and regular rhythm.     Heart sounds: Normal heart sounds.  Pulmonary:     Effort: Pulmonary effort is normal.     Breath sounds: Normal breath sounds.  Abdominal:     General: Bowel sounds are normal.     Palpations: Abdomen is soft.  Skin:    General: Skin is warm and dry.  Neurological:     Mental Status: She is alert and oriented to person, place, and time.     CMP Latest Ref Rng & Units 07/27/2021  Glucose 70 - 99 mg/dL 92  BUN 8 - 23 mg/dL 15  Creatinine 0.44 - 1.00 mg/dL 0.51  Sodium 135 - 145 mmol/L 137  Potassium 3.5 - 5.1 mmol/L 3.5  Chloride 98 - 111 mmol/L 104  CO2 22 - 32 mmol/L 27  Calcium 8.9 - 10.3 mg/dL 9.2  Total Protein 6.5 - 8.1 g/dL 6.4(L)  Total Bilirubin 0.3 - 1.2 mg/dL 0.3  Alkaline Phos 38 - 126 U/L 68  AST 15 - 41 U/L 15  ALT 0 - 44 U/L 14   CBC Latest Ref Rng & Units 07/27/2021  WBC 4.0 - 10.5 K/uL 3.7(L)  Hemoglobin 12.0 - 15.0 g/dL 10.5(L)  Hematocrit 36.0 - 46.0 % 32.6(L)  Platelets 150 - 400 K/uL 228    No images are attached to the encounter.  NM Bone Scan Whole Body  Result Date: 07/03/2021 CLINICAL DATA:  Small cell lung cancer, sternal chest pain EXAM: NUCLEAR MEDICINE WHOLE BODY BONE SCAN TECHNIQUE: Whole body anterior and posterior images were obtained  approximately 3 hours after intravenous injection of radiopharmaceutical. RADIOPHARMACEUTICALS:  19.48 mCi Technetium-80m MDP IV COMPARISON:  None. Findings are correlated with CT examination performed concurrently FINDINGS: Uptake within the shoulders, knees and midthoracic spine is likely degenerative in nature. No focal uptake of radiotracer to suggest osseous metastatic disease. Normal soft tissue distribution. Normal uptake and excretion within the kidneys and bladder. IMPRESSION: No osseous metastatic disease. Electronically Signed   By: Fidela Salisbury M.D.   On: 07/03/2021 00:06   CT CHEST ABDOMEN PELVIS W CONTRAST  Result Date: 07/02/2021 CLINICAL DATA:  Restaging small cell lung cancer. EXAM: CT CHEST, ABDOMEN, AND PELVIS WITH CONTRAST TECHNIQUE: Multidetector CT imaging of the chest, abdomen and pelvis was performed following the standard protocol during bolus administration of intravenous contrast. CONTRAST:  59mL OMNIPAQUE IOHEXOL 350 MG/ML SOLN COMPARISON:  03/16/2021 FINDINGS: CT CHEST FINDINGS Cardiovascular: Normal heart size. No pericardial effusion. Mild aortic atherosclerotic calcifications noted. Mediastinal tumor encases and narrows the left main pulmonary artery, image 20/2. Mediastinum/Nodes: Enlarged and heterogeneous thyroid gland is again noted and appears unchanged from previous exam In the setting of significant comorbidities or limited life expectancy, no follow-up recommended (ref: J Am Coll Radiol. 2015 Feb;12(2): 143-50).The trachea appears patent and is midline. Normal appearance of the esophagus. Large, heterogeneous mediastinal mass centered in the AP window lymph node is again noted. As noted previously this encases the aortic arch and left main pulmonary artery. This measures 6.6 x 6.5 by 5.0 cm (volume = 110 cm^3), image 18/2 and image 59/5. On the previous exam this measured 7.1 x 6.2 by 6.0 cm (volume = 140 cm^3). Left pre-vascular lymph node within the superior  mediastinum measures 1.6 cm, image 11/2. Previously 2.1 cm. Lungs/Pleura: There is a small left effusion which appears decreased in volume from previous exam. Bandlike area of subsegmental scar is identified within the anterior left upper lobe and appears similar to previous exam. New patchy areas of ground-glass densities are scattered within the left upper lobe are identified compatible with a nonspecific pneumonitis. New nodular density within the superior segment of left lower lobe measures 6 mm, image 47/4. Nonspecific. Small solid nodule within the left apex measures 6 mm, image 23/4. Unchanged from previous exam. New patchy area of ground-glass and airspace density is identified within the anteromedial right upper lobe, image 36/4. Small subpleural nodule within the periphery of the right middle lobe is unchanged measuring 4 mm, image 63/4. Right lower lobe subpleural nodule along the major fissure of the right lung is also unchanged measuring 3 mm, image 65/4. Musculoskeletal: No chest wall mass or suspicious bone lesions identified. CT ABDOMEN PELVIS FINDINGS Hepatobiliary: Mass within segment 4 B measures 4.4 x 4.2 cm, image 48/2. Previously 6.0 x 6.9 cm. No new suspicious liver lesions. There is a stable, benign appearing 1.8 cm enhancing lesion within the posteromedial right hepatic lobe, image 42/2. This is favored to represent a liver hemangioma. No signs of gallbladder inflammation or common bile duct dilatation. Pancreas: Unremarkable. No pancreatic ductal dilatation or surrounding inflammatory changes. Spleen: Normal in size without focal abnormality. Adrenals/Urinary Tract: Adrenal glands are unremarkable. Kidneys are normal, without renal calculi, focal lesion, or hydronephrosis. Bladder is unremarkable. Stomach/Bowel: Stomach appears normal. No bowel wall thickening, inflammation, or distension. Moderate stool burden noted throughout the colon. Vascular/Lymphatic: No aneurysm. No significant  abdominopelvic adenopathy. Reproductive: Status post hysterectomy.  No adnexal mass identified. Other: Diffuse subcutaneous soft tissue edema is identified throughout the imaged portions of the body wall. No significant free fluid or fluid collections within the abdomen or pelvis. Musculoskeletal: Lumbar spondylosis identified with anterolisthesis  of L4 on L5. Marked degenerative disc disease noted at L5-S1. No acute or suspicious osseous findings identified. IMPRESSION: 1. Interval response to therapy. The dominant mediastinal mass centered in the AP window lymph node exhibits mild interval decrease in size as described above. There is continued significant encasement and marked narrowing of the left main pulmonary artery. 2. Decrease in size of left pre-vascular superior mediastinal lymph node. 3. Decrease in size of left lobe of liver metastasis. 4. New patchy areas of ground-glass and airspace density within the left upper lobe and anteromedial right upper lobe are identified compatible with a nonspecific pneumonitis. Attention on follow-up imaging is advised. 5. Nonspecific pulmonary nodule within the superior segment of the left lower lobe is identified measuring 6 mm. Attention on future surveillance imaging is advised. 6. Decrease in volume of left pleural effusion. 7. Diffuse subcutaneous soft tissue edema throughout the imaged portions of the body wall. 8. Aortic atherosclerosis. Aortic Atherosclerosis (ICD10-I70.0). Electronically Signed   By: Kerby Moors M.D.   On: 07/02/2021 10:48     Assessment and plan- Patient is a 70 y.o. female with extensive stage small cell lung cancer with liver metastases here for on treatment assessment prior to cycle 8 of maintenance Tecentriq  Counts okay to proceed with cycle 8 of maintenance Tecentriq today and she will be seen by covering NP in 3 weeks for cycle 9 and I will see her in 6 weeks for cycle 10.  Bilateral leg swelling: Etiology unclear.  Recent TSH  was normal.  Echocardiogram is coming up later this week.  Clinically patient is not in heart failure.  She is using bilateral compression stockings as well as Lasix 20 mg daily.  Renal functions and liver functions are normal.  BNP that was checked by Dr. Ola Spurr was normal.  Neoplasm related pain: Increase fentanyl dosing to 25 mcg daily and continue as needed oxycodone.   Visit Diagnosis 1. Encounter for antineoplastic immunotherapy   2. Bilateral leg edema   3. Small cell carcinoma of lung metastatic to liver [facility])      Dr. Randa Evens, MD, MPH [facility] at [facility] [phone] 07/27/2021 1:13 PM

## 2021-07-27 NOTE — Patient Instructions (Signed)
[facility] ONCOLOGY  Discharge Instructions: Thank you for choosing [facility] to provide your oncology and hematology care.  If you have a lab appointment with the [facility], please go directly to the [facility] and check in at the registration area.  Wear comfortable clothing and clothing appropriate for easy access to any Portacath or PICC line.   We strive to give you quality time with your provider. You may need to reschedule your appointment if you arrive late (15 or more minutes).  Arriving late affects you and other patients whose appointments are after yours.  Also, if you miss three or more appointments without notifying the office, you may be dismissed from the clinic at the provider's discretion.      For prescription refill requests, have your pharmacy contact our office and allow 72 hours for refills to be completed.    Today you received the following chemotherapy and/or immunotherapy agents Tecentriq      To help prevent nausea and vomiting after your treatment, we encourage you to take your nausea medication as directed.  BELOW ARE SYMPTOMS THAT SHOULD BE REPORTED IMMEDIATELY: *FEVER GREATER THAN 100.4 F (38 C) OR HIGHER *CHILLS OR SWEATING *NAUSEA AND VOMITING THAT IS NOT CONTROLLED WITH YOUR NAUSEA MEDICATION *UNUSUAL SHORTNESS OF BREATH *UNUSUAL BRUISING OR BLEEDING *URINARY PROBLEMS (pain or burning when urinating, or frequent urination) *BOWEL PROBLEMS (unusual diarrhea, constipation, pain near the anus) TENDERNESS IN MOUTH AND THROAT WITH OR WITHOUT PRESENCE OF ULCERS (sore throat, sores in mouth, or a toothache) UNUSUAL RASH, SWELLING OR PAIN  UNUSUAL VAGINAL DISCHARGE OR ITCHING   Items with * indicate a potential emergency and should be followed up as soon as possible or go to the Emergency Department if any problems should occur.  Please show the CHEMOTHERAPY ALERT CARD or IMMUNOTHERAPY ALERT CARD at check-in  to the Emergency Department and triage nurse.  Should you have questions after your visit or need to cancel or reschedule your appointment, please contact [facility]  [phone] and follow the prompts.  Office hours are 8:00 a.m. to 4:30 p.m. Monday - Friday. Please note that voicemails left after 4:00 p.m. may not be returned until the following business day.  We are closed weekends and major holidays. You have access to a nurse at all times for urgent questions. Please call the main number to the clinic [phone] and follow the prompts.  For any non-urgent questions, you may also contact your provider using MyChart. We now offer e-Visits for anyone 62 and older to request care online for non-urgent symptoms. For details visit [email or web address].   Also download the MyChart app! Go to the app store, search "MyChart", open the app, select [facility], and log in with your MyChart username and password.  Due to Covid, a mask is required upon entering the hospital/clinic. If you do not have a mask, one will be given to you upon arrival. For doctor visits, patients may have 1 support person aged 18 or older with them. For treatment visits, patients cannot have anyone with them due to current Covid guidelines and our immunocompromised population.

## 2021-07-30 ENCOUNTER — Ambulatory Visit
Admission: RE | Admit: 2021-07-30 | Discharge: 2021-07-30 | Disposition: A | Discharge status: Home / Self Care | Payer: Medicare HMO | Source: Ambulatory Visit | Attending: Hospice and Palliative Medicine | Admitting: Hospice and Palliative Medicine

## 2021-07-30 ENCOUNTER — Other Ambulatory Visit: Payer: Self-pay

## 2021-07-30 DIAGNOSIS — I10 Essential (primary) hypertension: Secondary | ICD-10-CM | POA: Insufficient documentation

## 2021-07-30 DIAGNOSIS — I081 Rheumatic disorders of both mitral and tricuspid valves: Secondary | ICD-10-CM | POA: Diagnosis not present

## 2021-07-30 DIAGNOSIS — R600 Localized edema: Secondary | ICD-10-CM | POA: Diagnosis not present

## 2021-07-30 DIAGNOSIS — C787 Secondary malignant neoplasm of liver and intrahepatic bile duct: Secondary | ICD-10-CM | POA: Diagnosis not present

## 2021-07-30 DIAGNOSIS — E785 Hyperlipidemia, unspecified: Secondary | ICD-10-CM | POA: Diagnosis not present

## 2021-07-30 DIAGNOSIS — Z0189 Encounter for other specified special examinations: Secondary | ICD-10-CM | POA: Diagnosis not present

## 2021-07-30 DIAGNOSIS — E119 Type 2 diabetes mellitus without complications: Secondary | ICD-10-CM | POA: Diagnosis not present

## 2021-07-30 DIAGNOSIS — C3490 Malignant neoplasm of unspecified part of unspecified bronchus or lung: Secondary | ICD-10-CM

## 2021-07-30 NOTE — Progress Notes (Signed)
*  PRELIMINARY RESULTS* Echocardiogram 2D Echocardiogram has been performed.  Doris Johnson 07/30/2021, 10:50 AM

## 2021-08-02 LAB — ECHOCARDIOGRAM COMPLETE
AR max vel: 1.93 cm2
AV Area VTI: 2.14 cm2
AV Area mean vel: 1.92 cm2
AV Mean grad: 5 mmHg
AV Peak grad: 10.5 mmHg
Ao pk vel: 1.62 m/s
Area-P 1/2: 3.19 cm2
MV VTI: 1.83 cm2
S' Lateral: 2.31 cm

## 2021-08-16 NOTE — Progress Notes (Signed)
Hematology/Oncology Consult Note [facility]  Telephone:([phone] [phone]  Patient Care Team: Leonel Ramsay, MD as PCP - General (Infectious Diseases) Telford Nab, RN as Oncology Nurse Navigator   Name of the patient: Doris Johnson  [id number]  [birth year]   Date of visit: 08/17/21  Diagnosis-extensive stage small cell lung cancer with liver metastases  Chief complaint/ Reason for visit-on treatment assessment prior to cycle 9 of maintenance Tecentriq  Heme/Onc history:  Patient is a 70 year old female with a remote history of smoking in her teenage years and exposure to passive smoking.  She has been having ongoing midsternal pain which has been growing since the last 6 to 7 months.  She had been to urgent care as well in the past.  She finally had a CT chest with contrast done in October 2021 which showed a large mass in the left side of the mediastinum measuring 6.7 x 6.2 x 6.1 cm causing severe compression of the left main pulmonary artery and around the aortic arch in the left subclavian artery.  Enlarged thyroid gland.  Left upper lobe nodule 1 x 0.7 cm.  She then underwent bronchoscopy with Dr.Aleskerov left upper lobe biopsy was nondiagnostic.  However station 4, 7 and 10 L lymph nodes were consistent with metastatic small cell carcinoma.   PET CT scan showed enlarged level 3 cervical lymph node 2.2 cm that was hypermetabolic with an SUV of 9.7.  Large central 7.3 cm AP window mass.  5.7 cm left liver mass.  MRI brain with and without contrast showed 2 small foci of enhancement in the right cerebellar hemisphere and right temporal lobe without mass-effect or edema as well concerning for brain metastases   Patient had 2 cycles of carbo etoposide chemotherapy along with Tecentriq and subsequent scan showed no significant improvement in the primary lung mass or liver mass.  She received radiation treatment to her primary lung mass and also  seen by [facility] for second opinion.  Her pathology was reviewed at [facility] and reported as possible neuroendocrine neoplasm But stated that small cell carcinoma cannot be excluded but not definitely diagnostic for it.  However [facility] pathology feels that this is consistent with small cell lung cancer.  She has completed 4 cycles of carbo etoposide Tecentriq chemotherapy and is currently on maintenance Tecentriq.  Repeat liver biopsy was also performed and was consistent with small cell lung cancer   Interval history-patient returns to clinic for labs, and consideration of cycle 9 of maintenance Tecentriq.  Overall she feels well.  Has been using compression stockings and Lasix for lower extremity edema.  Otherwise, she feels well and denies specific complaints.  ECOG PS- 1 Pain scale- 0 Opioid associated constipation- no  Review of systems- Review of Systems  Constitutional:  Negative for chills, fever, malaise/fatigue and weight loss.  HENT:  Negative for congestion, ear discharge and nosebleeds.   Eyes:  Negative for blurred vision.  Respiratory:  Negative for cough, hemoptysis, sputum production, shortness of breath and wheezing.        Midsternal chest wall pain  Cardiovascular:  Positive for leg swelling. Negative for chest pain, palpitations, orthopnea and claudication.  Gastrointestinal:  Negative for abdominal pain, blood in stool, constipation, diarrhea, heartburn, melena, nausea and vomiting.  Genitourinary:  Negative for dysuria, flank pain, frequency, hematuria and urgency.  Musculoskeletal:  Negative for back pain, joint pain and myalgias.  Skin:  Negative for rash.  Neurological:  Negative for dizziness, tingling, focal weakness,  seizures, weakness and headaches.  Endo/Heme/Allergies:  Does not bruise/bleed easily.  Psychiatric/Behavioral:  Negative for depression and suicidal ideas. The patient is not nervous/anxious and does not have insomnia.      No Known Allergies   Past Medical  History:  Diagnosis Date   Cancer ([place])    Cataract    Diabetes mellitus without complication ([place])    Hyperlipidemia    Hypertension    Hypothyroidism    doctor's keeping an eye on thyroid      Past Surgical History:  Procedure Laterality Date   ABDOMINAL HYSTERECTOMY     PORTA CATH INSERTION N/A 11/30/2020   Procedure: PORTA CATH INSERTION;  Surgeon: Algernon Huxley, MD;  Location: [place] [facility];  Service: Cardiovascular;  Laterality: N/A;   VIDEO BRONCHOSCOPY WITH ENDOBRONCHIAL NAVIGATION N/A 10/21/2020   Procedure: VIDEO BRONCHOSCOPY WITH ENDOBRONCHIAL NAVIGATION;  Surgeon: Ottie Glazier, MD;  Location: [facility] ORS;  Service: Thoracic;  Laterality: N/A;   VIDEO BRONCHOSCOPY WITH ENDOBRONCHIAL ULTRASOUND N/A 10/21/2020   Procedure: VIDEO BRONCHOSCOPY WITH ENDOBRONCHIAL ULTRASOUND;  Surgeon: Ottie Glazier, MD;  Location: [facility] ORS;  Service: Thoracic;  Laterality: N/A;    Social History   Socioeconomic History   Marital status: Single    Spouse name: Not on file   Number of children: Not on file   Years of education: Not on file   Highest education level: Not on file  Occupational History   Not on file  Tobacco Use   Smoking status: Never   Smokeless tobacco: Never  Vaping Use   Vaping Use: Never used  Substance and Sexual Activity   Alcohol use: No   Drug use: No   Sexual activity: Not Currently  Other Topics Concern   Not on file  Social History Narrative   Not on file   Social Determinants of Health   Financial Resource Strain: Not on file  Food Insecurity: Food Insecurity Present   Worried About Running Out of Food in the Last Year: Sometimes true   Arboriculturist in the Last Year: Sometimes true  Transportation Needs: Unmet Transportation Needs   Lack of Transportation (Medical): Yes   Lack of Transportation (Non-Medical): Yes  Physical Activity: Not on file  Stress: Not on file  Social Connections: Not on file  Intimate Partner Violence: Not on  file    Family History  Problem Relation Age of Onset   Cancer Mother        breast   Hypertension Mother    Breast cancer Mother 49   Heart disease Father    Hyperlipidemia Brother    Heart disease Brother      Current Outpatient Medications:    docusate sodium (COLACE) 100 MG capsule, Take 200 mg by mouth at bedtime., Disp: , Rfl:    fentaNYL (DURAGESIC) 25 MCG/HR, Place 1 patch onto the skin every 3 (three) days., Disp: 10 patch, Rfl: 0   fluticasone (FLONASE) 50 MCG/ACT nasal spray, Place 2 sprays into both nostrils daily., Disp: 16 g, Rfl: 5   furosemide (LASIX) 20 MG tablet, Take 1 tablet (20 mg total) by mouth daily., Disp: 7 tablet, Rfl: 0   lidocaine-prilocaine (EMLA) cream, Apply 1 application topically as needed. Apply small amount to port site at least 1 hour prior to it being accessed, cover with plastic wrap, Disp: 30 g, Rfl: 1   mirtazapine (REMERON) 15 MG tablet, Take 15 mg by mouth at bedtime., Disp: , Rfl:    oxycodone (ROXICODONE)  30 MG immediate release tablet, Take 1 tablet (30 mg total) by mouth every 4 (four) hours as needed for pain., Disp: 180 tablet, Rfl: 0   polyethylene glycol (MIRALAX / GLYCOLAX) 17 g packet, Take 17 g by mouth daily., Disp: , Rfl:    potassium chloride SA (KLOR-CON) 20 MEQ tablet, Take 2 tablets (40 mEq total) by mouth daily., Disp: 7 tablet, Rfl: 0   LORazepam (ATIVAN) 0.5 MG tablet, Take 1 tablet (0.5 mg total) by mouth every 6 (six) hours as needed (Nausea or vomiting). (Patient not taking: No sig reported), Disp: 30 tablet, Rfl: 0   ondansetron (ZOFRAN) 8 MG tablet, Take 1 tablet (8 mg total) by mouth 2 (two) times daily as needed for refractory nausea / vomiting. Start on day 3 after carboplatin chemo. (Patient not taking: No sig reported), Disp: 30 tablet, Rfl: 1   prochlorperazine (COMPAZINE) 10 MG tablet, Take 1 tablet (10 mg total) by mouth every 6 (six) hours as needed (Nausea or vomiting). (Patient not taking: No sig reported),  Disp: 30 tablet, Rfl: 1 No current facility-administered medications for this visit.  Facility-Administered Medications Ordered in Other Visits:    sodium chloride flush (NS) 0.9 % injection 10 mL, 10 mL, Intravenous, PRN, Sindy Guadeloupe, MD, 10 mL at 12/15/20 0850  Physical exam:  Vitals:   08/17/21 0848  BP: (!) 142/73  Pulse: (!) 55  Resp: 16  Temp: 98 F (36.7 C)  TempSrc: Oral  Weight: 145 lb 12.8 oz (66.1 kg)  Height: 5\' 6"  (1.676 m)   Physical Exam Constitutional:      General: She is not in acute distress.    Comments: Thin build  Cardiovascular:     Rate and Rhythm: Normal rate and regular rhythm.     Heart sounds: Normal heart sounds.  Pulmonary:     Effort: Pulmonary effort is normal.     Breath sounds: Normal breath sounds.  Abdominal:     General: There is no distension.     Palpations: Abdomen is soft.     Tenderness: There is no abdominal tenderness.  Skin:    General: Skin is warm and dry.  Neurological:     Mental Status: She is alert and oriented to person, place, and time.     Gait: Gait normal.  Psychiatric:        Mood and Affect: Mood normal.        Behavior: Behavior normal.     CMP Latest Ref Rng & Units 08/17/2021  Glucose 70 - 99 mg/dL 128(H)  BUN 8 - 23 mg/dL 14  Creatinine 0.44 - 1.00 mg/dL 0.56  Sodium 135 - 145 mmol/L 135  Potassium 3.5 - 5.1 mmol/L 3.6  Chloride 98 - 111 mmol/L 103  CO2 22 - 32 mmol/L 26  Calcium 8.9 - 10.3 mg/dL 9.1  Total Protein 6.5 - 8.1 g/dL 6.6  Total Bilirubin 0.3 - 1.2 mg/dL 0.6  Alkaline Phos 38 - 126 U/L 71  AST 15 - 41 U/L 19  ALT 0 - 44 U/L 16   CBC Latest Ref Rng & Units 08/17/2021  WBC 4.0 - 10.5 K/uL 3.4(L)  Hemoglobin 12.0 - 15.0 g/dL 10.5(L)  Hematocrit 36.0 - 46.0 % 32.8(L)  Platelets 150 - 400 K/uL 223    No images are attached to the encounter.  ECHOCARDIOGRAM COMPLETE  Result Date: 08/02/2021    ECHOCARDIOGRAM REPORT   Patient Name:   ZAYNA TOSTE [facility] Date of Exam: 07/30/2021 Medical  Rec #:  [id number]         Height:       66.0 in Accession #:    [id number]        Weight:       143.0 lb Date of Birth:  04-21-1951          BSA:          1.734 m Patient Age:    70 years          BP:           162/98 mmHg Patient Gender: F                 HR:           69 bpm. Exam Location:  [facility] Procedure: 2D Echo, Color Doppler, Cardiac Doppler and Strain Analysis Indications:     Bilateral edema of lower extremity  History:         Patient has no prior history of Echocardiogram examinations.                  Risk Factors:Hypertension, Diabetes and Dyslipidemia. Small                  cell carcinoma of lung metastatic to liver.  Sonographer:     Charmayne Sheer Referring Phys:  [id number] [place] Diagnosing Phys: Kathlyn Sacramento MD  Sonographer Comments: Global longitudinal strain was attempted. IMPRESSIONS  1. Left ventricular ejection fraction, by estimation, is 60 to 65%. The left ventricle has normal function. The left ventricle has no regional wall motion abnormalities. Left ventricular diastolic parameters were normal. The average left ventricular global longitudinal strain is -20.5 %. The global longitudinal strain is normal.  2. Right ventricular systolic function is normal. The right ventricular size is normal. There is normal pulmonary artery systolic pressure.  3. The mitral valve is normal in structure. Mild mitral valve regurgitation. No evidence of mitral stenosis.  4. Tricuspid valve regurgitation is mild to moderate.  5. The aortic valve is normal in structure. Aortic valve regurgitation is not visualized. No aortic stenosis is present.  6. The inferior vena cava is normal in size with greater than 50% respiratory variability, suggesting right atrial pressure of 3 mmHg. FINDINGS  Left Ventricle: Left ventricular ejection fraction, by estimation, is 60 to 65%. The left ventricle has normal function. The left ventricle has no regional wall motion abnormalities. The average left ventricular global  longitudinal strain is -20.5 %. The global longitudinal strain is normal. The left ventricular internal cavity size was normal in size. There is no left ventricular hypertrophy. Left ventricular diastolic parameters were normal. Right Ventricle: The right ventricular size is normal. No increase in right ventricular wall thickness. Right ventricular systolic function is normal. There is normal pulmonary artery systolic pressure. The tricuspid regurgitant velocity is 2.69 m/s, and  with an assumed right atrial pressure of 3 mmHg, the estimated right ventricular systolic pressure is 25.9 mmHg. Left Atrium: Left atrial size was normal in size. Right Atrium: Right atrial size was normal in size. Pericardium: There is no evidence of pericardial effusion. Mitral Valve: The mitral valve is normal in structure. Mild mitral valve regurgitation. No evidence of mitral valve stenosis. MV peak gradient, 5.0 mmHg. The mean mitral valve gradient is 2.0 mmHg. Tricuspid Valve: The tricuspid valve is normal in structure. Tricuspid valve regurgitation is mild to moderate. No evidence of tricuspid stenosis. Aortic Valve: The aortic valve is normal in structure. Aortic  valve regurgitation is not visualized. No aortic stenosis is present. Aortic valve mean gradient measures 5.0 mmHg. Aortic valve peak gradient measures 10.5 mmHg. Aortic valve area, by VTI measures 2.14 cm. Pulmonic Valve: The pulmonic valve was normal in structure. Pulmonic valve regurgitation is not visualized. No evidence of pulmonic stenosis. Aorta: The aortic root is normal in size and structure. Venous: The inferior vena cava is normal in size with greater than 50% respiratory variability, suggesting right atrial pressure of 3 mmHg. IAS/Shunts: No atrial level shunt detected by color flow Doppler.  LEFT VENTRICLE PLAX 2D LVIDd:         4.31 cm  Diastology LVIDs:         2.31 cm  LV e' medial:    7.18 cm/s LV PW:         1.03 cm  LV E/e' medial:  13.9 LV IVS:         0.59 cm  LV e' lateral:   8.92 cm/s LVOT diam:     1.80 cm  LV E/e' lateral: 11.2 LV SV:         62 LV SV Index:   36       2D Longitudinal Strain LVOT Area:     2.54 cm 2D Strain GLS Avg:     -20.5 %  RIGHT VENTRICLE RV Basal diam:  3.64 cm LEFT ATRIUM             Index       RIGHT ATRIUM           Index LA diam:        4.10 cm 2.36 cm/m  RA Area:     13.80 cm LA Vol (A2C):   38.4 ml 22.14 ml/m RA Volume:   29.70 ml  17.12 ml/m LA Vol (A4C):   37.1 ml 21.39 ml/m LA Biplane Vol: 38.4 ml 22.14 ml/m  AORTIC VALVE                   PULMONIC VALVE AV Area (Vmax):    1.93 cm    PV Vmax:       1.03 m/s AV Area (Vmean):   1.92 cm    PV Vmean:      68.400 cm/s AV Area (VTI):     2.14 cm    PV VTI:        0.202 m AV Vmax:           162.00 cm/s PV Peak grad:  4.2 mmHg AV Vmean:          98.400 cm/s PV Mean grad:  2.0 mmHg AV VTI:            0.292 m AV Peak Grad:      10.5 mmHg AV Mean Grad:      5.0 mmHg LVOT Vmax:         123.00 cm/s LVOT Vmean:        74.100 cm/s LVOT VTI:          0.245 m LVOT/AV VTI ratio: 0.84  AORTA Ao Root diam: 3.00 cm MITRAL VALVE                TRICUSPID VALVE MV Area (PHT): 3.19 cm     TR Peak grad:   28.9 mmHg MV Area VTI:   1.83 cm     TR Vmax:        269.00 cm/s MV Peak grad:  5.0 mmHg MV Mean grad:  2.0 mmHg     SHUNTS MV Vmax:       1.12 m/s     Systemic VTI:  0.24 m MV Vmean:      66.9 cm/s    Systemic Diam: 1.80 cm MV Decel Time: 238 msec MV E velocity: 100.00 cm/s MV A velocity: 78.80 cm/s MV E/A ratio:  1.27 Kathlyn Sacramento MD Electronically signed by Kathlyn Sacramento MD Signature Date/Time: 08/02/2021/1:43:04 PM    Final      Assessment and plan- Patient is a 70 y.o. female with extensive stage small cell lung cancer with liver metastases here for on treatment assessment prior to cycle 9 of maintenance Tecentriq  Counts okay to proceed with cycle 9 of maintenance Tecentriq.  Return to clinic in 3 weeks for labs, cycle 9 of Tecentriq.  In 6 weeks, labs, see Dr. Janese Banks, cycle 10  of Tecentriq   Bilateral leg swelling: Etiology unclear.  Recent TSH was normal.  Echocardiogram showed normal EF.  Clinically not consistent with heart failure.  BNP with her PCP was also normal.  Her albumin is normal.  Suspect dependent edema.  Continue compression and elevation.  Activity as tolerated.  Neoplasm related pain: Continue fentanyl 25 mcg patch every 3 days and continue as needed oxycodone 30 mg for breakthrough pain.   Visit Diagnosis 1. Encounter for antineoplastic immunotherapy    Beckey Rutter, [place], AGNP-C [place] at [facility] [phone] (clinic) 08/17/2021 1:13 PM

## 2021-08-17 ENCOUNTER — Other Ambulatory Visit: Payer: Self-pay | Admitting: Oncology

## 2021-08-17 ENCOUNTER — Encounter: Payer: Self-pay | Admitting: Nurse Practitioner

## 2021-08-17 ENCOUNTER — Inpatient Hospital Stay: Payer: Medicare HMO | Attending: Hospice and Palliative Medicine

## 2021-08-17 ENCOUNTER — Inpatient Hospital Stay (HOSPITAL_BASED_OUTPATIENT_CLINIC_OR_DEPARTMENT_OTHER): Payer: Medicare HMO | Admitting: Nurse Practitioner

## 2021-08-17 ENCOUNTER — Inpatient Hospital Stay: Payer: Medicare HMO

## 2021-08-17 VITALS — BP 142/73 | HR 55 | Temp 98.0°F | Resp 16 | Ht 66.0 in | Wt 145.8 lb

## 2021-08-17 DIAGNOSIS — C3412 Malignant neoplasm of upper lobe, left bronchus or lung: Secondary | ICD-10-CM | POA: Insufficient documentation

## 2021-08-17 DIAGNOSIS — C787 Secondary malignant neoplasm of liver and intrahepatic bile duct: Secondary | ICD-10-CM | POA: Insufficient documentation

## 2021-08-17 DIAGNOSIS — Z9221 Personal history of antineoplastic chemotherapy: Secondary | ICD-10-CM | POA: Insufficient documentation

## 2021-08-17 DIAGNOSIS — M7989 Other specified soft tissue disorders: Secondary | ICD-10-CM | POA: Diagnosis not present

## 2021-08-17 DIAGNOSIS — Z5982 Transportation insecurity: Secondary | ICD-10-CM | POA: Diagnosis not present

## 2021-08-17 DIAGNOSIS — Z8349 Family history of other endocrine, nutritional and metabolic diseases: Secondary | ICD-10-CM | POA: Insufficient documentation

## 2021-08-17 DIAGNOSIS — Z79899 Other long term (current) drug therapy: Secondary | ICD-10-CM | POA: Insufficient documentation

## 2021-08-17 DIAGNOSIS — Z803 Family history of malignant neoplasm of breast: Secondary | ICD-10-CM | POA: Insufficient documentation

## 2021-08-17 DIAGNOSIS — Z8249 Family history of ischemic heart disease and other diseases of the circulatory system: Secondary | ICD-10-CM | POA: Insufficient documentation

## 2021-08-17 DIAGNOSIS — G893 Neoplasm related pain (acute) (chronic): Secondary | ICD-10-CM | POA: Insufficient documentation

## 2021-08-17 DIAGNOSIS — Z5112 Encounter for antineoplastic immunotherapy: Secondary | ICD-10-CM

## 2021-08-17 DIAGNOSIS — R600 Localized edema: Secondary | ICD-10-CM | POA: Diagnosis not present

## 2021-08-17 DIAGNOSIS — E049 Nontoxic goiter, unspecified: Secondary | ICD-10-CM | POA: Insufficient documentation

## 2021-08-17 DIAGNOSIS — Z87891 Personal history of nicotine dependence: Secondary | ICD-10-CM | POA: Diagnosis not present

## 2021-08-17 DIAGNOSIS — C3490 Malignant neoplasm of unspecified part of unspecified bronchus or lung: Secondary | ICD-10-CM

## 2021-08-17 DIAGNOSIS — Z5941 Food insecurity: Secondary | ICD-10-CM | POA: Diagnosis not present

## 2021-08-17 LAB — CBC WITH DIFFERENTIAL/PLATELET
Abs Immature Granulocytes: 0 10*3/uL (ref 0.00–0.07)
Basophils Absolute: 0 10*3/uL (ref 0.0–0.1)
Basophils Relative: 0 %
Eosinophils Absolute: 0.1 10*3/uL (ref 0.0–0.5)
Eosinophils Relative: 3 %
HCT: 32.8 % — ABNORMAL LOW (ref 36.0–46.0)
Hemoglobin: 10.5 g/dL — ABNORMAL LOW (ref 12.0–15.0)
Immature Granulocytes: 0 %
Lymphocytes Relative: 14 %
Lymphs Abs: 0.5 10*3/uL — ABNORMAL LOW (ref 0.7–4.0)
MCH: 28.6 pg (ref 26.0–34.0)
MCHC: 32 g/dL (ref 30.0–36.0)
MCV: 89.4 fL (ref 80.0–100.0)
Monocytes Absolute: 0.5 10*3/uL (ref 0.1–1.0)
Monocytes Relative: 14 %
Neutro Abs: 2.3 10*3/uL (ref 1.7–7.7)
Neutrophils Relative %: 69 %
Platelets: 223 10*3/uL (ref 150–400)
RBC: 3.67 MIL/uL — ABNORMAL LOW (ref 3.87–5.11)
RDW: 14.6 % (ref 11.5–15.5)
WBC: 3.4 10*3/uL — ABNORMAL LOW (ref 4.0–10.5)
nRBC: 0 % (ref 0.0–0.2)

## 2021-08-17 LAB — COMPREHENSIVE METABOLIC PANEL
ALT: 16 U/L (ref 0–44)
AST: 19 U/L (ref 15–41)
Albumin: 3.8 g/dL (ref 3.5–5.0)
Alkaline Phosphatase: 71 U/L (ref 38–126)
Anion gap: 6 (ref 5–15)
BUN: 14 mg/dL (ref 8–23)
CO2: 26 mmol/L (ref 22–32)
Calcium: 9.1 mg/dL (ref 8.9–10.3)
Chloride: 103 mmol/L (ref 98–111)
Creatinine, Ser: 0.56 mg/dL (ref 0.44–1.00)
GFR, Estimated: >60 mL/min (ref >60)
Glucose, Bld: 128 mg/dL — ABNORMAL HIGH (ref 70–99)
Potassium: 3.6 mmol/L (ref 3.5–5.1)
Sodium: 135 mmol/L (ref 135–145)
Total Bilirubin: 0.6 mg/dL (ref 0.3–1.2)
Total Protein: 6.6 g/dL (ref 6.5–8.1)

## 2021-08-17 MED ORDER — HEPARIN SOD (PORK) LOCK FLUSH 100 UNIT/ML IV SOLN
500.0000 [IU] | Freq: Once | INTRAVENOUS | Status: AC | PRN
  Filled 2021-08-17: qty 5

## 2021-08-17 MED ORDER — SODIUM CHLORIDE 0.9 % IV SOLN
Freq: Once | INTRAVENOUS | Status: AC
  Filled 2021-08-17: qty 250

## 2021-08-17 MED ORDER — HEPARIN SOD (PORK) LOCK FLUSH 100 UNIT/ML IV SOLN
INTRAVENOUS | Status: AC
  Administered 2021-08-17: 500 [IU]
  Filled 2021-08-17: qty 5

## 2021-08-17 MED ORDER — SODIUM CHLORIDE 0.9 % IV SOLN
1200.0000 mg | Freq: Once | INTRAVENOUS | Status: AC
  Administered 2021-08-17: 1200 mg via INTRAVENOUS
  Filled 2021-08-17: qty 20

## 2021-08-17 NOTE — Progress Notes (Signed)
Pt having feet edema.

## 2021-08-17 NOTE — Patient Instructions (Signed)
[facility] ONCOLOGY  Discharge Instructions: Thank you for choosing [facility] to provide your oncology and hematology care.  If you have a lab appointment with the [facility], please go directly to the [facility] and check in at the registration area.  Wear comfortable clothing and clothing appropriate for easy access to any Portacath or PICC line.   We strive to give you quality time with your provider. You may need to reschedule your appointment if you arrive late (15 or more minutes).  Arriving late affects you and other patients whose appointments are after yours.  Also, if you miss three or more appointments without notifying the office, you may be dismissed from the clinic at the provider's discretion.      For prescription refill requests, have your pharmacy contact our office and allow 72 hours for refills to be completed.    Today you received the following chemotherapy and/or immunotherapy agents Tecentriq       To help prevent nausea and vomiting after your treatment, we encourage you to take your nausea medication as directed.  BELOW ARE SYMPTOMS THAT SHOULD BE REPORTED IMMEDIATELY: *FEVER GREATER THAN 100.4 F (38 C) OR HIGHER *CHILLS OR SWEATING *NAUSEA AND VOMITING THAT IS NOT CONTROLLED WITH YOUR NAUSEA MEDICATION *UNUSUAL SHORTNESS OF BREATH *UNUSUAL BRUISING OR BLEEDING *URINARY PROBLEMS (pain or burning when urinating, or frequent urination) *BOWEL PROBLEMS (unusual diarrhea, constipation, pain near the anus) TENDERNESS IN MOUTH AND THROAT WITH OR WITHOUT PRESENCE OF ULCERS (sore throat, sores in mouth, or a toothache) UNUSUAL RASH, SWELLING OR PAIN  UNUSUAL VAGINAL DISCHARGE OR ITCHING   Items with * indicate a potential emergency and should be followed up as soon as possible or go to the Emergency Department if any problems should occur.  Please show the CHEMOTHERAPY ALERT CARD or IMMUNOTHERAPY ALERT CARD at check-in  to the Emergency Department and triage nurse.  Should you have questions after your visit or need to cancel or reschedule your appointment, please contact [facility]  [phone] and follow the prompts.  Office hours are 8:00 a.m. to 4:30 p.m. Monday - Friday. Please note that voicemails left after 4:00 p.m. may not be returned until the following business day.  We are closed weekends and major holidays. You have access to a nurse at all times for urgent questions. Please call the main number to the clinic [phone] and follow the prompts.  For any non-urgent questions, you may also contact your provider using MyChart. We now offer e-Visits for anyone 94 and older to request care online for non-urgent symptoms. For details visit [email or web address].   Also download the MyChart app! Go to the app store, search "MyChart", open the app, select [facility], and log in with your MyChart username and password.  Due to Covid, a mask is required upon entering the hospital/clinic. If you do not have a mask, one will be given to you upon arrival. For doctor visits, patients may have 1 support person aged 21 or older with them. For treatment visits, patients cannot have anyone with them due to current Covid guidelines and our immunocompromised population.

## 2021-08-18 DIAGNOSIS — R600 Localized edema: Secondary | ICD-10-CM | POA: Diagnosis not present

## 2021-08-18 DIAGNOSIS — I10 Essential (primary) hypertension: Secondary | ICD-10-CM | POA: Diagnosis not present

## 2021-08-18 DIAGNOSIS — C7931 Secondary malignant neoplasm of brain: Secondary | ICD-10-CM | POA: Diagnosis not present

## 2021-08-18 DIAGNOSIS — C3490 Malignant neoplasm of unspecified part of unspecified bronchus or lung: Secondary | ICD-10-CM | POA: Diagnosis not present

## 2021-08-18 DIAGNOSIS — C787 Secondary malignant neoplasm of liver and intrahepatic bile duct: Secondary | ICD-10-CM | POA: Diagnosis not present

## 2021-08-18 DIAGNOSIS — E119 Type 2 diabetes mellitus without complications: Secondary | ICD-10-CM | POA: Diagnosis not present

## 2021-08-18 DIAGNOSIS — R634 Abnormal weight loss: Secondary | ICD-10-CM | POA: Diagnosis not present

## 2021-08-19 ENCOUNTER — Other Ambulatory Visit (HOSPITAL_COMMUNITY): Payer: Self-pay | Admitting: Infectious Diseases

## 2021-08-19 ENCOUNTER — Other Ambulatory Visit: Payer: Self-pay | Admitting: Infectious Diseases

## 2021-08-19 DIAGNOSIS — R600 Localized edema: Secondary | ICD-10-CM

## 2021-08-27 ENCOUNTER — Telehealth: Payer: Self-pay | Admitting: *Deleted

## 2021-08-27 DIAGNOSIS — M7989 Other specified soft tissue disorders: Secondary | ICD-10-CM

## 2021-08-27 NOTE — Telephone Encounter (Signed)
Refer to vascular surgery. 

## 2021-08-27 NOTE — Telephone Encounter (Signed)
Pt left message stating that she continues to have bilateral leg swelling. She has been taking the lasix which helps some but not a lot. Asking if there are any other recommendations to help with her leg swelling.   Please advise.

## 2021-08-27 NOTE — Telephone Encounter (Signed)
Referral placed. Pt made aware.

## 2021-09-01 ENCOUNTER — Ambulatory Visit
Admission: RE | Admit: 2021-09-01 | Discharge: 2021-09-01 | Disposition: A | Discharge status: Home / Self Care | Payer: Medicare HMO | Source: Ambulatory Visit | Attending: Infectious Diseases | Admitting: Infectious Diseases

## 2021-09-01 ENCOUNTER — Other Ambulatory Visit: Payer: Self-pay

## 2021-09-01 ENCOUNTER — Ambulatory Visit: Payer: Medicare HMO | Admitting: Radiation Oncology

## 2021-09-01 DIAGNOSIS — R590 Localized enlarged lymph nodes: Secondary | ICD-10-CM | POA: Diagnosis not present

## 2021-09-01 DIAGNOSIS — R600 Localized edema: Secondary | ICD-10-CM | POA: Insufficient documentation

## 2021-09-06 ENCOUNTER — Encounter: Payer: Self-pay | Admitting: Oncology

## 2021-09-07 ENCOUNTER — Inpatient Hospital Stay: Payer: Medicare HMO

## 2021-09-07 ENCOUNTER — Other Ambulatory Visit: Payer: Self-pay

## 2021-09-07 ENCOUNTER — Inpatient Hospital Stay (HOSPITAL_BASED_OUTPATIENT_CLINIC_OR_DEPARTMENT_OTHER): Payer: Medicare HMO | Admitting: Oncology

## 2021-09-07 ENCOUNTER — Other Ambulatory Visit: Payer: Self-pay | Admitting: *Deleted

## 2021-09-07 ENCOUNTER — Encounter: Payer: Self-pay | Admitting: Oncology

## 2021-09-07 VITALS — BP 145/81 | HR 55 | Temp 98.3°F | Resp 16 | Ht 66.0 in | Wt 157.8 lb

## 2021-09-07 DIAGNOSIS — Z5112 Encounter for antineoplastic immunotherapy: Secondary | ICD-10-CM | POA: Diagnosis not present

## 2021-09-07 DIAGNOSIS — G893 Neoplasm related pain (acute) (chronic): Secondary | ICD-10-CM | POA: Diagnosis not present

## 2021-09-07 DIAGNOSIS — C3490 Malignant neoplasm of unspecified part of unspecified bronchus or lung: Secondary | ICD-10-CM

## 2021-09-07 DIAGNOSIS — M7989 Other specified soft tissue disorders: Secondary | ICD-10-CM | POA: Diagnosis not present

## 2021-09-07 DIAGNOSIS — R600 Localized edema: Secondary | ICD-10-CM | POA: Diagnosis not present

## 2021-09-07 DIAGNOSIS — Z5982 Transportation insecurity: Secondary | ICD-10-CM | POA: Diagnosis not present

## 2021-09-07 DIAGNOSIS — Z5941 Food insecurity: Secondary | ICD-10-CM | POA: Diagnosis not present

## 2021-09-07 DIAGNOSIS — E049 Nontoxic goiter, unspecified: Secondary | ICD-10-CM | POA: Diagnosis not present

## 2021-09-07 DIAGNOSIS — C3412 Malignant neoplasm of upper lobe, left bronchus or lung: Secondary | ICD-10-CM | POA: Diagnosis not present

## 2021-09-07 DIAGNOSIS — C787 Secondary malignant neoplasm of liver and intrahepatic bile duct: Secondary | ICD-10-CM | POA: Diagnosis not present

## 2021-09-07 LAB — CBC WITH DIFFERENTIAL/PLATELET
Abs Immature Granulocytes: 0.01 10*3/uL (ref 0.00–0.07)
Basophils Absolute: 0 10*3/uL (ref 0.0–0.1)
Basophils Relative: 0 %
Eosinophils Absolute: 0.1 10*3/uL (ref 0.0–0.5)
Eosinophils Relative: 2 %
HCT: 32.1 % — ABNORMAL LOW (ref 36.0–46.0)
Hemoglobin: 10.3 g/dL — ABNORMAL LOW (ref 12.0–15.0)
Immature Granulocytes: 0 %
Lymphocytes Relative: 17 %
Lymphs Abs: 0.6 10*3/uL — ABNORMAL LOW (ref 0.7–4.0)
MCH: 28.7 pg (ref 26.0–34.0)
MCHC: 32.1 g/dL (ref 30.0–36.0)
MCV: 89.4 fL (ref 80.0–100.0)
Monocytes Absolute: 0.5 10*3/uL (ref 0.1–1.0)
Monocytes Relative: 13 %
Neutro Abs: 2.6 10*3/uL (ref 1.7–7.7)
Neutrophils Relative %: 68 %
Platelets: 211 10*3/uL (ref 150–400)
RBC: 3.59 MIL/uL — ABNORMAL LOW (ref 3.87–5.11)
RDW: 14.5 % (ref 11.5–15.5)
WBC: 3.8 10*3/uL — ABNORMAL LOW (ref 4.0–10.5)
nRBC: 0 % (ref 0.0–0.2)

## 2021-09-07 LAB — COMPREHENSIVE METABOLIC PANEL
ALT: 28 U/L (ref 0–44)
AST: 34 U/L (ref 15–41)
Albumin: 3.8 g/dL (ref 3.5–5.0)
Alkaline Phosphatase: 70 U/L (ref 38–126)
Anion gap: 3 — ABNORMAL LOW (ref 5–15)
BUN: 13 mg/dL (ref 8–23)
CO2: 30 mmol/L (ref 22–32)
Calcium: 9.4 mg/dL (ref 8.9–10.3)
Chloride: 106 mmol/L (ref 98–111)
Creatinine, Ser: 0.59 mg/dL (ref 0.44–1.00)
GFR, Estimated: >60 mL/min (ref >60)
Glucose, Bld: 110 mg/dL — ABNORMAL HIGH (ref 70–99)
Potassium: 3.5 mmol/L (ref 3.5–5.1)
Sodium: 139 mmol/L (ref 135–145)
Total Bilirubin: 0.7 mg/dL (ref 0.3–1.2)
Total Protein: 6.8 g/dL (ref 6.5–8.1)

## 2021-09-07 LAB — TSH: TSH: 1.622 u[IU]/mL (ref 0.350–4.500)

## 2021-09-07 MED ORDER — FUROSEMIDE 20 MG PO TABS: 20.0000 mg | ORAL_TABLET | Freq: Two times a day (BID) | ORAL | 0 refills | Status: DC

## 2021-09-07 MED ORDER — HEPARIN SOD (PORK) LOCK FLUSH 100 UNIT/ML IV SOLN
500.0000 [IU] | Freq: Once | INTRAVENOUS | Status: AC
Start: 2021-09-07 — End: 2021-09-07
  Administered 2021-09-07: 500 [IU]
  Filled 2021-09-07: qty 5

## 2021-09-07 MED ORDER — OXYCODONE HCL 30 MG PO TABS: 30.0000 mg | ORAL_TABLET | ORAL | 0 refills | Status: DC | PRN

## 2021-09-07 MED ORDER — SODIUM CHLORIDE 0.9 % IV SOLN
1200.0000 mg | Freq: Once | INTRAVENOUS | Status: AC
  Administered 2021-09-07: 1200 mg via INTRAVENOUS
  Filled 2021-09-07: qty 20

## 2021-09-07 MED ORDER — SODIUM CHLORIDE 0.9 % IV SOLN
Freq: Once | INTRAVENOUS | Status: AC
  Filled 2021-09-07: qty 250

## 2021-09-07 NOTE — Progress Notes (Signed)
Hematology/Oncology Consult note [facility]  Telephone:([phone] [phone]  Patient Care Team: Leonel Ramsay, MD as PCP - General (Infectious Diseases) Telford Nab, RN as Oncology Nurse Navigator   Name of the patient: Doris Johnson  [id number]  1951-10-31   Date of visit: 09/07/21  Diagnosis- extensive stage small cell lung cancer with liver metastases  Chief complaint/ Reason for visit-on treatment assessment prior to cycle 9 of maintenance Tecentriq  Heme/Onc history: Patient is a 70 year old female with a remote history of smoking in her teenage years and exposure to passive smoking.  She has been having ongoing midsternal pain which has been growing since the last 6 to 7 months.  She had been to urgent care as well in the past.  She finally had a CT chest with contrast done in October 2021 which showed a large mass in the left side of the mediastinum measuring 6.7 x 6.2 x 6.1 cm causing severe compression of the left main pulmonary artery and around the aortic arch in the left subclavian artery.  Enlarged thyroid gland.  Left upper lobe nodule 1 x 0.7 cm.  She then underwent bronchoscopy with Dr.Aleskerov left upper lobe biopsy was nondiagnostic.  However station 4, 7 and 10 L lymph nodes were consistent with metastatic small cell carcinoma.   PET CT scan showed enlarged level 3 cervical lymph node 2.2 cm that was hypermetabolic with an SUV of 9.7.  Large central 7.3 cm AP window mass.  5.7 cm left liver mass.  MRI brain with and without contrast showed 2 small foci of enhancement in the right cerebellar hemisphere and right temporal lobe without mass-effect or edema as well concerning for brain metastases   Patient had 2 cycles of carbo etoposide chemotherapy along with Tecentriq and subsequent scan showed no significant improvement in the primary lung mass or liver mass.  She received radiation treatment to her primary lung mass and also  seen by [facility] for second opinion.  Her pathology was reviewed at [facility] and reported as possible neuroendocrine neoplasm But stated that small cell carcinoma cannot be excluded but not definitely diagnostic for it.  However [facility] pathology feels that this is consistent with small cell lung cancer.  She has completed 4 cycles of carbo etoposide Tecentriq chemotherapy and is currently on maintenance Tecentriq.  Repeat liver biopsy was also performed and was consistent with small cell lung cancer    Interval history-continues to have bilateral lower extremity swelling which gets a little better with compression stocking use.  It waxes and wanes.  She is currently on Lasix 20 mg daily.  Chest pain is currently well controlled with oxycodone.  She is not using the fentanyl patch.  ECOG PS- 1 Pain scale- 3 Opioid associated constipation- no  Review of systems- Review of Systems  Constitutional:  Positive for malaise/fatigue. Negative for chills, fever and weight loss.  HENT:  Negative for congestion, ear discharge and nosebleeds.   Eyes:  Negative for blurred vision.  Respiratory:  Negative for cough, hemoptysis, sputum production, shortness of breath and wheezing.   Cardiovascular:  Positive for leg swelling. Negative for chest pain, palpitations, orthopnea and claudication.  Gastrointestinal:  Negative for abdominal pain, blood in stool, constipation, diarrhea, heartburn, melena, nausea and vomiting.  Genitourinary:  Negative for dysuria, flank pain, frequency, hematuria and urgency.  Musculoskeletal:  Negative for back pain, joint pain and myalgias.  Skin:  Negative for rash.  Neurological:  Negative for dizziness, tingling,  focal weakness, seizures, weakness and headaches.  Endo/Heme/Allergies:  Does not bruise/bleed easily.  Psychiatric/Behavioral:  Negative for depression and suicidal ideas. The patient does not have insomnia.      No Known Allergies   Past Medical History:  Diagnosis Date    Cancer ([place])    Cataract    Diabetes mellitus without complication ([place])    Hyperlipidemia    Hypertension    Hypothyroidism    doctor's keeping an eye on thyroid      Past Surgical History:  Procedure Laterality Date   ABDOMINAL HYSTERECTOMY     PORTA CATH INSERTION N/A 11/30/2020   Procedure: PORTA CATH INSERTION;  Surgeon: Algernon Huxley, MD;  Location: [place] [facility];  Service: Cardiovascular;  Laterality: N/A;   VIDEO BRONCHOSCOPY WITH ENDOBRONCHIAL NAVIGATION N/A 10/21/2020   Procedure: VIDEO BRONCHOSCOPY WITH ENDOBRONCHIAL NAVIGATION;  Surgeon: Ottie Glazier, MD;  Location: [facility];  Service: Thoracic;  Laterality: N/A;   VIDEO BRONCHOSCOPY WITH ENDOBRONCHIAL ULTRASOUND N/A 10/21/2020   Procedure: VIDEO BRONCHOSCOPY WITH ENDOBRONCHIAL ULTRASOUND;  Surgeon: Ottie Glazier, MD;  Location: [facility];  Service: Thoracic;  Laterality: N/A;    Social History   Socioeconomic History   Marital status: Single    Spouse name: Not on file   Number of children: Not on file   Years of education: Not on file   Highest education level: Not on file  Occupational History   Not on file  Tobacco Use   Smoking status: Never   Smokeless tobacco: Never  Vaping Use   Vaping Use: Never used  Substance and Sexual Activity   Alcohol use: No   Drug use: No   Sexual activity: Not Currently  Other Topics Concern   Not on file  Social History Narrative   Not on file   Social Determinants of Health   Financial Resource Strain: Not on file  Food Insecurity: Food Insecurity Present   Worried About Running Out of Food in the Last Year: Sometimes true   Arboriculturist in the Last Year: Sometimes true  Transportation Needs: Unmet Transportation Needs   Lack of Transportation (Medical): Yes   Lack of Transportation (Non-Medical): Yes  Physical Activity: Not on file  Stress: Not on file  Social Connections: Not on file  Intimate Partner Violence: Not on file    Family History   Problem Relation Age of Onset   Cancer Mother        breast   Hypertension Mother    Breast cancer Mother 39   Heart disease Father    Hyperlipidemia Brother    Heart disease Brother      Current Outpatient Medications:    fluticasone (FLONASE) 50 MCG/ACT nasal spray, Place 2 sprays into both nostrils daily., Disp: 16 g, Rfl: 5   lidocaine-prilocaine (EMLA) cream, Apply 1 application topically as needed. Apply small amount to port site at least 1 hour prior to it being accessed, cover with plastic wrap, Disp: 30 g, Rfl: 1   mirtazapine (REMERON) 15 MG tablet, Take 15 mg by mouth at bedtime., Disp: , Rfl:    polyethylene glycol (MIRALAX / GLYCOLAX) 17 g packet, Take 17 g by mouth daily., Disp: , Rfl:    potassium chloride SA (KLOR-CON) 20 MEQ tablet, Take 2 tablets (40 mEq total) by mouth daily., Disp: 7 tablet, Rfl: 0   furosemide (LASIX) 20 MG tablet, Take 1 tablet (20 mg total) by mouth 2 (two) times daily., Disp: 60 tablet, Rfl: 0  LORazepam (ATIVAN) 0.5 MG tablet, Take 1 tablet (0.5 mg total) by mouth every 6 (six) hours as needed (Nausea or vomiting). (Patient not taking: No sig reported), Disp: 30 tablet, Rfl: 0   ondansetron (ZOFRAN) 8 MG tablet, Take 1 tablet (8 mg total) by mouth 2 (two) times daily as needed for refractory nausea / vomiting. Start on day 3 after carboplatin chemo. (Patient not taking: No sig reported), Disp: 30 tablet, Rfl: 1   oxycodone (ROXICODONE) 30 MG immediate release tablet, Take 1 tablet (30 mg total) by mouth every 4 (four) hours as needed for pain., Disp: 180 tablet, Rfl: 0   prochlorperazine (COMPAZINE) 10 MG tablet, Take 1 tablet (10 mg total) by mouth every 6 (six) hours as needed (Nausea or vomiting). (Patient not taking: No sig reported), Disp: 30 tablet, Rfl: 1 No current facility-administered medications for this visit.  Facility-Administered Medications Ordered in Other Visits:    sodium chloride flush (NS) 0.9 % injection 10 mL, 10 mL,  Intravenous, PRN, Sindy Guadeloupe, MD, 10 mL at 12/15/20 0850  Physical exam:  Vitals:   09/07/21 0837  BP: (!) 145/81  Pulse: (!) 55  Resp: 16  Temp: 98.3 F (36.8 C)  TempSrc: Oral  Weight: 157 lb 12.8 oz (71.6 kg)  Height: 5\' 6"  (1.676 m)   Physical Exam Constitutional:      General: She is not in acute distress. Cardiovascular:     Rate and Rhythm: Normal rate and regular rhythm.     Heart sounds: Normal heart sounds.  Pulmonary:     Effort: Pulmonary effort is normal.     Breath sounds: Normal breath sounds.  Abdominal:     General: Bowel sounds are normal.     Palpations: Abdomen is soft.  Musculoskeletal:     Comments: Bilateral thigh-high compression stockings.  +1 bilateral edema.  There is some pitting edema noted about the level of compression stockings as well  Skin:    General: Skin is warm and dry.  Neurological:     Mental Status: She is alert and oriented to person, place, and time.     CMP Latest Ref Rng & Units 09/07/2021  Glucose 70 - 99 mg/dL 110(H)  BUN 8 - 23 mg/dL 13  Creatinine 0.44 - 1.00 mg/dL 0.59  Sodium 135 - 145 mmol/L 139  Potassium 3.5 - 5.1 mmol/L 3.5  Chloride 98 - 111 mmol/L 106  CO2 22 - 32 mmol/L 30  Calcium 8.9 - 10.3 mg/dL 9.4  Total Protein 6.5 - 8.1 g/dL 6.8  Total Bilirubin 0.3 - 1.2 mg/dL 0.7  Alkaline Phos 38 - 126 U/L 70  AST 15 - 41 U/L 34  ALT 0 - 44 U/L 28   CBC Latest Ref Rng & Units 09/07/2021  WBC 4.0 - 10.5 K/uL 3.8(L)  Hemoglobin 12.0 - 15.0 g/dL 10.3(L)  Hematocrit 36.0 - 46.0 % 32.1(L)  Platelets 150 - 400 K/uL 211    No images are attached to the encounter.  US Venous Img Lower Bilateral (DVT)  Result Date: 09/01/2021 CLINICAL DATA:  Bilateral lower extremity edema for 2-3 weeks EXAM: BILATERAL LOWER EXTREMITY VENOUS DOPPLER ULTRASOUND TECHNIQUE: Gray-scale sonography with compression, as well as color and duplex ultrasound, were performed to evaluate the deep venous system(s) from the level of the  common femoral vein through the popliteal and proximal calf veins. COMPARISON:  None. FINDINGS: VENOUS Normal compressibility of the common femoral, superficial femoral, and popliteal veins, as well as the visualized calf  veins. Visualized portions of profunda femoral vein and great saphenous vein unremarkable. No filling defects to suggest DVT on grayscale or color Doppler imaging. Doppler waveforms show normal direction of venous flow, normal respiratory plasticity and response to augmentation. OTHER Mildly enlarged morphologically abnormal lymph nodes seen within the inguinal regions bilaterally. Limitations: none IMPRESSION: 1. No lower extremity DVT. 2. Mildly enlarged morphologically abnormal lymph nodes noted with the inguinal region bilaterally. While these may be inflammatory, metastatic disease should also be considered given patient's history of malignancy. Electronically Signed   By: Miachel Roux M.D.   On: 09/01/2021 11:41     Assessment and plan- Patient is a 70 y.o. female with extensive stage small cell lung cancer with liver metastases here for on treatment assessment prior to cycle 9 of maintenance Tecentriq  Counts okay to proceed with cycle 9 of maintenance Tecentriq today and I will see her back in 3 weeks for cycle 10.  Plan to repeat CT chest abdomen pelvis with contrast and bone scan in about 4 weeks time.  Bilateral leg swelling: Etiology unclear.  TSH normal.  Echocardiogram normal.  Her kidney and liver functions are also normal.  I have asked her to increase Lasix to 20 mg twice daily for about a week and after that she will go back to taking it once a day.  I will also refer her to vascular surgery.  Bilateral lower extremity ultrasound also did not show any evidence of DVT which showedMildly enlarged inguinal lymph nodes bilaterally.  I do not think that this is contributing to her lower extremity edema though.  Neoplasm related pain: Continue as needed oxycodone   Visit  Diagnosis 1. Small cell lung cancer ([place])   2. Encounter for antineoplastic immunotherapy   3. Bilateral leg edema      Dr. Randa Evens, MD, MPH [facility] [phone] 09/07/2021 12:33 PM

## 2021-09-07 NOTE — Progress Notes (Signed)
Nutrition Follow-up:  Patient with small cell lung cancer with metastatic disease.  Patient receiving tecentriq.  Met with patient during infusion.  Patient reports that appetite is better.  Eating eggs, toast, sausage for breakfast. Lunch is chicken, pinto beans or black eyed peas, cabbage.  Supper maybe meatloaf and vegetables.  Drinks 2 ensure originals during the day.  Denies nausea. Bowels moving with help of miralax.    Noted referral to vascular surgeon regarding bilateral leg edema (comes up to mid thigh)    Medications: remeron  Labs: reviewed  Anthropometrics:   Weight 157 lb 12.8 oz (bilateral leg edema today)  143 lb (leg edema)  123 lb 6.4 oz on 8/2 118 lb on 7/12 121 lb on 14.4 oz on 6/21 123 lb on 5/31 124 lb on 3/22 138 lb on 2/8 147 lb on 1/8   NUTRITION DIAGNOSIS: Inadequate oral intake improving   INTERVENTION:  Continue oral nutrition supplements. Coupons given Continue focusing on foods high in protein and calories.     MONITORING, EVALUATION, GOAL: weight trends, intake   NEXT VISIT: Tuesday, Nov 15 during infusion  Doris Johnson B. Zenia Resides, [place], [place] Registered Dietitian [phone] (mobile)

## 2021-09-07 NOTE — Progress Notes (Signed)
Pt still having leg swelling from ankle to mid thigh. Pt needs refill of pain med. No other concerns

## 2021-09-07 NOTE — Patient Instructions (Signed)
[facility] ONCOLOGY  Discharge Instructions: Thank you for choosing [facility] to provide your oncology and hematology care.  If you have a lab appointment with the [facility], please go directly to the [facility] and check in at the registration area.  Wear comfortable clothing and clothing appropriate for easy access to any Portacath or PICC line.   We strive to give you quality time with your provider. You may need to reschedule your appointment if you arrive late (15 or more minutes).  Arriving late affects you and other patients whose appointments are after yours.  Also, if you miss three or more appointments without notifying the office, you may be dismissed from the clinic at the provider's discretion.      For prescription refill requests, have your pharmacy contact our office and allow 72 hours for refills to be completed.    Today you received the following chemotherapy and/or immunotherapy agents : tecentriq   To help prevent nausea and vomiting after your treatment, we encourage you to take your nausea medication as directed.  BELOW ARE SYMPTOMS THAT SHOULD BE REPORTED IMMEDIATELY: *FEVER GREATER THAN 100.4 F (38 C) OR HIGHER *CHILLS OR SWEATING *NAUSEA AND VOMITING THAT IS NOT CONTROLLED WITH YOUR NAUSEA MEDICATION *UNUSUAL SHORTNESS OF BREATH *UNUSUAL BRUISING OR BLEEDING *URINARY PROBLEMS (pain or burning when urinating, or frequent urination) *BOWEL PROBLEMS (unusual diarrhea, constipation, pain near the anus) TENDERNESS IN MOUTH AND THROAT WITH OR WITHOUT PRESENCE OF ULCERS (sore throat, sores in mouth, or a toothache) UNUSUAL RASH, SWELLING OR PAIN  UNUSUAL VAGINAL DISCHARGE OR ITCHING   Items with * indicate a potential emergency and should be followed up as soon as possible or go to the Emergency Department if any problems should occur.  Please show the CHEMOTHERAPY ALERT CARD or IMMUNOTHERAPY ALERT CARD at check-in to  the Emergency Department and triage nurse.  Should you have questions after your visit or need to cancel or reschedule your appointment, please contact [facility]  [phone] and follow the prompts.  Office hours are 8:00 a.m. to 4:30 p.m. Monday - Friday. Please note that voicemails left after 4:00 p.m. may not be returned until the following business day.  We are closed weekends and major holidays. You have access to a nurse at all times for urgent questions. Please call the main number to the clinic [phone] and follow the prompts.  For any non-urgent questions, you may also contact your provider using MyChart. We now offer e-Visits for anyone 102 and older to request care online for non-urgent symptoms. For details visit [email or web address].   Also download the MyChart app! Go to the app store, search "MyChart", open the app, select , and log in with your MyChart username and password.  Due to Covid, a mask is required upon entering the hospital/clinic. If you do not have a mask, one will be given to you upon arrival. For doctor visits, patients may have 1 support person aged 33 or older with them. For treatment visits, patients cannot have anyone with them due to current Covid guidelines and our immunocompromised population.

## 2021-09-13 ENCOUNTER — Encounter (INDEPENDENT_AMBULATORY_CARE_PROVIDER_SITE_OTHER): Payer: Self-pay | Admitting: Vascular Surgery

## 2021-09-13 ENCOUNTER — Other Ambulatory Visit: Payer: Self-pay

## 2021-09-13 ENCOUNTER — Ambulatory Visit (INDEPENDENT_AMBULATORY_CARE_PROVIDER_SITE_OTHER): Payer: Medicare HMO | Admitting: Vascular Surgery

## 2021-09-13 VITALS — BP 147/73 | HR 84 | Ht 66.0 in | Wt 156.0 lb

## 2021-09-13 DIAGNOSIS — I890 Lymphedema, not elsewhere classified: Secondary | ICD-10-CM

## 2021-09-13 DIAGNOSIS — M79669 Pain in unspecified lower leg: Secondary | ICD-10-CM

## 2021-09-13 DIAGNOSIS — R0989 Other specified symptoms and signs involving the circulatory and respiratory systems: Secondary | ICD-10-CM

## 2021-09-13 DIAGNOSIS — I872 Venous insufficiency (chronic) (peripheral): Secondary | ICD-10-CM | POA: Insufficient documentation

## 2021-09-13 DIAGNOSIS — E7800 Pure hypercholesterolemia, unspecified: Secondary | ICD-10-CM

## 2021-09-13 DIAGNOSIS — M7989 Other specified soft tissue disorders: Secondary | ICD-10-CM | POA: Diagnosis not present

## 2021-09-13 DIAGNOSIS — I10 Essential (primary) hypertension: Secondary | ICD-10-CM | POA: Diagnosis not present

## 2021-09-13 DIAGNOSIS — E1169 Type 2 diabetes mellitus with other specified complication: Secondary | ICD-10-CM

## 2021-09-13 NOTE — Progress Notes (Signed)
MRN : [id number]  Doris Johnson is a 70 y.o. ([birth year]) female who presents with chief complaint of legs swell and hurt.  History of Present Illness:   Patient is seen for evaluation of leg pain and leg swelling. The patient first noticed the swelling remotely. The swelling is associated with pain and discoloration. The pain and swelling worsens with prolonged dependency and improves with elevation. The pain is unrelated to activity.  The patient notes that in the morning the legs are significantly improved but they steadily worsened throughout the course of the day. The patient also notes a steady worsening of the discoloration in the ankle and shin area.   The patient denies claudication symptoms.  The patient denies symptoms consistent with rest pain.  The patient denies and extensive history of DJD and LS spine disease.  The patient has no had any past angiography, interventions or vascular surgery.  Elevation makes the leg symptoms better, dependency makes them much worse. There is no history of ulcerations. The patient denies any recent changes in medications.  The patient has not been wearing graduated compression.  The patient denies a history of DVT or PE. There is no prior history of phlebitis. There is no history of primary lymphedema.  No history of malignancies. No history of trauma or groin or pelvic surgery. There is no history of radiation treatment to the groin or pelvis  The patient denies amaurosis fugax or recent TIA symptoms. There are no recent neurological changes noted. The patient denies recent episodes of angina or shortness of breath   No outpatient medications have been marked as taking for the 09/13/21 encounter (Appointment) with Delana Meyer, Dolores Lory, MD.    Past Medical History:  Diagnosis Date   Cancer [facility])    Cataract    Diabetes mellitus without complication ([place])    Hyperlipidemia    Hypertension    Hypothyroidism    doctor's keeping an  eye on thyroid     Past Surgical History:  Procedure Laterality Date   ABDOMINAL HYSTERECTOMY     PORTA CATH INSERTION N/A 11/30/2020   Procedure: PORTA CATH INSERTION;  Surgeon: Algernon Huxley, MD;  Location: [place] [facility];  Service: Cardiovascular;  Laterality: N/A;   VIDEO BRONCHOSCOPY WITH ENDOBRONCHIAL NAVIGATION N/A 10/21/2020   Procedure: VIDEO BRONCHOSCOPY WITH ENDOBRONCHIAL NAVIGATION;  Surgeon: Ottie Glazier, MD;  Location: [facility];  Service: Thoracic;  Laterality: N/A;   VIDEO BRONCHOSCOPY WITH ENDOBRONCHIAL ULTRASOUND N/A 10/21/2020   Procedure: VIDEO BRONCHOSCOPY WITH ENDOBRONCHIAL ULTRASOUND;  Surgeon: Ottie Glazier, MD;  Location: [facility];  Service: Thoracic;  Laterality: N/A;    Social History Social History   Tobacco Use   Smoking status: Never   Smokeless tobacco: Never  Vaping Use   Vaping Use: Never used  Substance Use Topics   Alcohol use: No   Drug use: No    Family History Family History  Problem Relation Age of Onset   Cancer Mother        breast   Hypertension Mother    Breast cancer Mother 28   Heart disease Father    Hyperlipidemia Brother    Heart disease Brother     No Known Allergies   REVIEW OF SYSTEMS (Negative unless checked)  Constitutional: [] Weight loss  [] Fever  [] Chills Cardiac: [] Chest pain   [] Chest pressure   [] Palpitations   [] Shortness of breath when laying flat   [] Shortness of breath with exertion. Vascular:  [] Pain in legs with walking   []   Pain in legs at rest  [] History of DVT   [] Phlebitis   [x] Swelling in legs   [] Varicose veins   [] Non-healing ulcers Pulmonary:   [] Uses home oxygen   [] Productive cough   [] Hemoptysis   [] Wheeze  [] COPD   [] Asthma Neurologic:  [] Dizziness   [] Seizures   [] History of stroke   [] History of TIA  [] Aphasia   [] Vissual changes   [] Weakness or numbness in arm   [] Weakness or numbness in leg Musculoskeletal:   [] Joint swelling   [] Joint pain   [] Low back pain Hematologic:  [] Easy  bruising  [] Easy bleeding   [] Hypercoagulable state   [] Anemic Gastrointestinal:  [] Diarrhea   [] Vomiting  [] Gastroesophageal reflux/heartburn   [] Difficulty swallowing. Genitourinary:  [] Chronic kidney disease   [] Difficult urination  [] Frequent urination   [] Blood in urine Skin:  [] Rashes   [] Ulcers  Psychological:  [] History of anxiety   []  History of major depression.  Physical Examination  There were no vitals filed for this visit. There is no height or weight on file to calculate BMI. Gen: WD/WN, NAD Head: [place]/AT, No temporalis wasting.  Ear/Nose/Throat: Hearing grossly intact, nares w/o erythema or drainage, pinna without lesions Eyes: PER, EOMI, sclera nonicteric.  Neck: Supple, no gross masses.  No JVD.  Pulmonary:  Good air movement, no audible wheezing, no use of accessory muscles.  Cardiac: RRR, precordium not hyperdynamic. Vascular:  scattered varicosities present bilaterally.  Mild venous stasis changes to the legs bilaterally.  2+ soft pitting edema. 3/6 right carotid bruit. Vessel Right Left  Radial Palpable Palpable  Gastrointestinal: soft, non-distended. No guarding/no peritoneal signs.  Musculoskeletal: M/S 5/5 throughout.  No deformity.  Neurologic: CN 2-12 intact. Pain and light touch intact in extremities.  Symmetrical.  Speech is fluent. Motor exam as listed above. Psychiatric: Judgment intact, Mood & affect appropriate for pt's clinical situation. Dermatologic: Venous rashes no ulcers noted.  No changes consistent with cellulitis. Lymph : No lichenification or skin changes of chronic lymphedema.  CBC Lab Results  Component Value Date   WBC 3.8 (L) 09/07/2021   HGB 10.3 (L) 09/07/2021   HCT 32.1 (L) 09/07/2021   MCV 89.4 09/07/2021   PLT 211 09/07/2021    BMET    Component Value Date/Time   NA 139 09/07/2021 0810   NA 141 03/09/2015 0000   K 3.5 09/07/2021 0810   CL 106 09/07/2021 0810   CO2 30 09/07/2021 0810   GLUCOSE 110 (H) 09/07/2021 0810   BUN  13 09/07/2021 0810   BUN 11 03/09/2015 0000   CREATININE 0.59 09/07/2021 0810   CALCIUM 9.4 09/07/2021 0810   GFRNONAA >60 09/07/2021 0810   Estimated Creatinine Clearance: 65.4 mL/min (by C-G formula based on SCr of 0.59 mg/dL).  COAG Lab Results  Component Value Date   INR 1.0 03/30/2021   INR 1.0 09/29/2020    Radiology US Venous Img Lower Bilateral (DVT)  Result Date: 09/01/2021 CLINICAL DATA:  Bilateral lower extremity edema for 2-3 weeks EXAM: BILATERAL LOWER EXTREMITY VENOUS DOPPLER ULTRASOUND TECHNIQUE: Gray-scale sonography with compression, as well as color and duplex ultrasound, were performed to evaluate the deep venous system(s) from the level of the common femoral vein through the popliteal and proximal calf veins. COMPARISON:  None. FINDINGS: VENOUS Normal compressibility of the common femoral, superficial femoral, and popliteal veins, as well as the visualized calf veins. Visualized portions of profunda femoral vein and great saphenous vein unremarkable. No filling defects to suggest DVT on grayscale or color Doppler  imaging. Doppler waveforms show normal direction of venous flow, normal respiratory plasticity and response to augmentation. OTHER Mildly enlarged morphologically abnormal lymph nodes seen within the inguinal regions bilaterally. Limitations: none IMPRESSION: 1. No lower extremity DVT. 2. Mildly enlarged morphologically abnormal lymph nodes noted with the inguinal region bilaterally. While these may be inflammatory, metastatic disease should also be considered given patient's history of malignancy. Electronically Signed   By: Miachel Roux M.D.   On: 09/01/2021 11:41     Assessment/Plan 1. Pain and swelling of lower leg, unspecified laterality I have had a long discussion with the patient regarding swelling and why it  causes symptoms.  Patient will begin wearing graduated compression stockings class 1 (20-30 mmHg) on a daily basis a prescription was given. The  patient will  beginning wearing the stockings first thing in the morning and removing them in the evening. The patient is instructed specifically not to sleep in the stockings.   In addition, behavioral modification will be initiated.  This will include frequent elevation, use of over the counter pain medications and exercise such as walking.  I have reviewed systemic causes for chronic edema such as liver, kidney and cardiac etiologies.  The patient denies problems with these organ systems.    Consideration for a lymph pump will also be made based upon the effectiveness of conservative therapy.  This would help to improve the edema control and prevent sequela such as ulcers and infections   Patient should undergo duplex ultrasound of the venous system to ensure that DVT or reflux is not present.  The patient will follow-up with me after the ultrasound.   - VAS [place] LOWER EXTREMITY VENOUS REFLUX; Future  2. Lymphedema I have had a long discussion with the patient regarding swelling and why it  causes symptoms.  Patient will begin wearing graduated compression stockings class 1 (20-30 mmHg) on a daily basis a prescription was given. The patient will  beginning wearing the stockings first thing in the morning and removing them in the evening. The patient is instructed specifically not to sleep in the stockings.   In addition, behavioral modification will be initiated.  This will include frequent elevation, use of over the counter pain medications and exercise such as walking.  I have reviewed systemic causes for chronic edema such as liver, kidney and cardiac etiologies.  The patient denies problems with these organ systems.    Consideration for a lymph pump will also be made based upon the effectiveness of conservative therapy.  This would help to improve the edema control and prevent sequela such as ulcers and infections   Patient should undergo duplex ultrasound of the venous system to ensure  that DVT or reflux is not present.  The patient will follow-up with me after the ultrasound.   - VAS [place] LOWER EXTREMITY VENOUS REFLUX; Future  3. Bruit of right carotid artery I will order a carotid duplex to evaluate for carotid stenosis - VAS US CAROTID; Future  4. Hypercholesteremia Continue statin as ordered and reviewed, no changes at this time   5. Primary hypertension Continue antihypertensive medications as already ordered, these medications have been reviewed and there are no changes at this time.   6. Type 2 diabetes mellitus with other specified complication, unspecified whether long term insulin use ([place]) Continue hypoglycemic medications as already ordered, these medications have been reviewed and there are no changes at this time.  Hgb A1C to be monitored as already arranged by primary service  Hortencia Pilar, MD  09/13/2021 8:51 AM

## 2021-09-14 ENCOUNTER — Ambulatory Visit (INDEPENDENT_AMBULATORY_CARE_PROVIDER_SITE_OTHER): Payer: Medicare HMO

## 2021-09-14 DIAGNOSIS — R0989 Other specified symptoms and signs involving the circulatory and respiratory systems: Secondary | ICD-10-CM | POA: Diagnosis not present

## 2021-09-15 DIAGNOSIS — Z23 Encounter for immunization: Secondary | ICD-10-CM | POA: Diagnosis not present

## 2021-09-15 DIAGNOSIS — K5900 Constipation, unspecified: Secondary | ICD-10-CM | POA: Diagnosis not present

## 2021-09-15 DIAGNOSIS — C787 Secondary malignant neoplasm of liver and intrahepatic bile duct: Secondary | ICD-10-CM | POA: Diagnosis not present

## 2021-09-15 DIAGNOSIS — R600 Localized edema: Secondary | ICD-10-CM | POA: Diagnosis not present

## 2021-09-15 DIAGNOSIS — E119 Type 2 diabetes mellitus without complications: Secondary | ICD-10-CM | POA: Diagnosis not present

## 2021-09-15 DIAGNOSIS — C7951 Secondary malignant neoplasm of bone: Secondary | ICD-10-CM | POA: Diagnosis not present

## 2021-09-15 DIAGNOSIS — C3490 Malignant neoplasm of unspecified part of unspecified bronchus or lung: Secondary | ICD-10-CM | POA: Diagnosis not present

## 2021-09-21 DIAGNOSIS — H52223 Regular astigmatism, bilateral: Secondary | ICD-10-CM | POA: Diagnosis not present

## 2021-09-27 NOTE — Progress Notes (Signed)
Hematology/Oncology Consult note [facility]  [phone] [phone]  Patient Care Team: Leonel Ramsay, MD as PCP - General (Infectious Diseases) Telford Nab, RN as Oncology Nurse Navigator   Name of the patient: Jackilyn Umphlett  [id number]  [birth year]   Date of visit: 09/27/21  Diagnosis- extensive stage small cell lung cancer with liver metastases  Chief complaint/ Reason for visit- on treatment assessment prior to cycle 10 of maintenance Tecentriq  Heme/Onc history: Patient is a 70 year old female with a remote history of smoking in her teenage years and exposure to passive smoking.  She has been having ongoing midsternal pain which has been growing since the last 6 to 7 months.  She had been to urgent care as well in the past.  She finally had a CT chest with contrast done in October 2021 which showed a large mass in the left side of the mediastinum measuring 6.7 x 6.2 x 6.1 cm causing severe compression of the left main pulmonary artery and around the aortic arch in the left subclavian artery.  Enlarged thyroid gland.  Left upper lobe nodule 1 x 0.7 cm.  She then underwent bronchoscopy with Dr.Aleskerov left upper lobe biopsy was nondiagnostic.  However station 4, 7 and 10 L lymph nodes were consistent with metastatic small cell carcinoma.   PET CT scan showed enlarged level 3 cervical lymph node 2.2 cm that was hypermetabolic with an SUV of 9.7.  Large central 7.3 cm AP window mass.  5.7 cm left liver mass.  MRI brain with and without contrast showed 2 small foci of enhancement in the right cerebellar hemisphere and right temporal lobe without mass-effect or edema as well concerning for brain metastases   Patient had 2 cycles of carbo etoposide chemotherapy along with Tecentriq and subsequent scan showed no significant improvement in the primary lung mass or liver mass.  She received radiation treatment to her primary lung mass and  also seen by [facility] for second opinion.  Her pathology was reviewed at [facility] and reported as possible neuroendocrine neoplasm But stated that small cell carcinoma cannot be excluded but not definitely diagnostic for it.  However [facility] pathology feels that this is consistent with small cell lung cancer.  She has completed 4 cycles of carbo etoposide Tecentriq chemotherapy and is currently on maintenance Tecentriq.  Repeat liver biopsy was also performed and was consistent with small cell lung cancer     Interval history-she continues to have issues with leg edema for which she is using bilateral compression stockings.  Chest wall pain is presently well controlled.  Weight has remained stable.  Appetite is fair.  ECOG PS- 2 Pain scale- 0 Opioid associated constipation- no  Review of systems- Review of Systems  Constitutional:  Positive for malaise/fatigue. Negative for chills, fever and weight loss.  HENT:  Negative for congestion, ear discharge and nosebleeds.   Eyes:  Negative for blurred vision.  Respiratory:  Negative for cough, hemoptysis, sputum production, shortness of breath and wheezing.   Cardiovascular:  Negative for chest pain, palpitations, orthopnea and claudication.  Gastrointestinal:  Negative for abdominal pain, blood in stool, constipation, diarrhea, heartburn, melena, nausea and vomiting.  Genitourinary:  Negative for dysuria, flank pain, frequency, hematuria and urgency.  Musculoskeletal:  Negative for back pain, joint pain and myalgias.  Skin:  Negative for rash.  Neurological:  Negative for dizziness, tingling, focal weakness, seizures, weakness and headaches.  Endo/Heme/Allergies:  Does not bruise/bleed easily.  Psychiatric/Behavioral:  Negative for depression and suicidal ideas. The patient does not have insomnia.       No Known Allergies   Past Medical History:  Diagnosis Date   Cancer ([place])    Cataract    Diabetes mellitus without complication ([place])     Hyperlipidemia    Hypertension    Hypothyroidism    doctor's keeping an eye on thyroid      Past Surgical History:  Procedure Laterality Date   ABDOMINAL HYSTERECTOMY     PORTA CATH INSERTION N/A 11/30/2020   Procedure: PORTA CATH INSERTION;  Surgeon: Algernon Huxley, MD;  Location: [facility];  Service: Cardiovascular;  Laterality: N/A;   VIDEO BRONCHOSCOPY WITH ENDOBRONCHIAL NAVIGATION N/A 10/21/2020   Procedure: VIDEO BRONCHOSCOPY WITH ENDOBRONCHIAL NAVIGATION;  Surgeon: Ottie Glazier, MD;  Location: [facility];  Service: Thoracic;  Laterality: N/A;   VIDEO BRONCHOSCOPY WITH ENDOBRONCHIAL ULTRASOUND N/A 10/21/2020   Procedure: VIDEO BRONCHOSCOPY WITH ENDOBRONCHIAL ULTRASOUND;  Surgeon: Ottie Glazier, MD;  Location: [facility];  Service: Thoracic;  Laterality: N/A;    Social History   Socioeconomic History   Marital status: Single    Spouse name: Not on file   Number of children: Not on file   Years of education: Not on file   Highest education level: Not on file  Occupational History   Not on file  Tobacco Use   Smoking status: Never   Smokeless tobacco: Never  Vaping Use   Vaping Use: Never used  Substance and Sexual Activity   Alcohol use: No   Drug use: No   Sexual activity: Not Currently  Other Topics Concern   Not on file  Social History Narrative   Not on file   Social Determinants of Health   Financial Resource Strain: Not on file  Food Insecurity: Food Insecurity Present   Worried About Running Out of Food in the Last Year: Sometimes true   Arboriculturist in the Last Year: Sometimes true  Transportation Needs: Unmet Transportation Needs   Lack of Transportation (Medical): Yes   Lack of Transportation (Non-Medical): Yes  Physical Activity: Not on file  Stress: Not on file  Social Connections: Not on file  Intimate Partner Violence: Not on file    Family History  Problem Relation Age of Onset   Cancer Mother        breast   Hypertension  Mother    Breast cancer Mother 6   Heart disease Father    Hyperlipidemia Brother    Heart disease Brother      Current Outpatient Medications:    fluticasone (FLONASE) 50 MCG/ACT nasal spray, Place 2 sprays into both nostrils daily., Disp: 16 g, Rfl: 5   furosemide (LASIX) 20 MG tablet, Take 1 tablet (20 mg total) by mouth 2 (two) times daily., Disp: 60 tablet, Rfl: 0   lidocaine-prilocaine (EMLA) cream, Apply 1 application topically as needed. Apply small amount to port site at least 1 hour prior to it being accessed, cover with plastic wrap, Disp: 30 g, Rfl: 1   LORazepam (ATIVAN) 0.5 MG tablet, Take 1 tablet (0.5 mg total) by mouth every 6 (six) hours as needed (Nausea or vomiting)., Disp: 30 tablet, Rfl: 0   mirtazapine (REMERON) 15 MG tablet, Take 15 mg by mouth at bedtime., Disp: , Rfl:    naloxone (NARCAN) nasal spray 4 mg/0.1 mL, , Disp: , Rfl:    ondansetron (ZOFRAN) 8 MG tablet, Take 1 tablet (8 mg total) by mouth  2 (two) times daily as needed for refractory nausea / vomiting. Start on day 3 after carboplatin chemo., Disp: 30 tablet, Rfl: 1   oxycodone (ROXICODONE) 30 MG immediate release tablet, Take 1 tablet (30 mg total) by mouth every 4 (four) hours as needed for pain., Disp: 180 tablet, Rfl: 0   polyethylene glycol (MIRALAX / GLYCOLAX) 17 g packet, Take 17 g by mouth daily., Disp: , Rfl:    potassium chloride SA (KLOR-CON) 20 MEQ tablet, Take 2 tablets (40 mEq total) by mouth daily., Disp: 7 tablet, Rfl: 0   prochlorperazine (COMPAZINE) 10 MG tablet, Take 1 tablet (10 mg total) by mouth every 6 (six) hours as needed (Nausea or vomiting)., Disp: 30 tablet, Rfl: 1 No current facility-administered medications for this visit.  Facility-Administered Medications Ordered in Other Visits:    sodium chloride flush (NS) 0.9 % injection 10 mL, 10 mL, Intravenous, PRN, Sindy Guadeloupe, MD, 10 mL at 12/15/20 0850  Physical exam:  Vitals:   09/28/21 1015  BP: (!) 142/76  Pulse: (!) 59   Resp: 16  Temp: 98.2 F (36.8 C)  TempSrc: Tympanic  Weight: 159 lb 11.2 oz (72.4 kg)  Height: 5\' 6"  (1.676 m)   Physical Exam Constitutional:      Comments: Thin elderly female in no acute distress  Cardiovascular:     Rate and Rhythm: Normal rate and regular rhythm.     Heart sounds: Normal heart sounds.  Pulmonary:     Effort: Pulmonary effort is normal.     Breath sounds: Normal breath sounds.  Abdominal:     General: Bowel sounds are normal.     Palpations: Abdomen is soft.  Musculoskeletal:     Comments: Bilateral compression stockings in place  Skin:    General: Skin is warm and dry.  Neurological:     Mental Status: She is alert and oriented to person, place, and time.     CMP Latest Ref Rng & Units 09/28/2021  Glucose 70 - 99 mg/dL 109(H)  BUN 8 - 23 mg/dL 11  Creatinine 0.44 - 1.00 mg/dL 0.68  Sodium 135 - 145 mmol/L 137  Potassium 3.5 - 5.1 mmol/L 3.6  Chloride 98 - 111 mmol/L 104  CO2 22 - 32 mmol/L 27  Calcium 8.9 - 10.3 mg/dL 9.3  Total Protein 6.5 - 8.1 g/dL 7.0  Total Bilirubin 0.3 - 1.2 mg/dL 0.5  Alkaline Phos 38 - 126 U/L 71  AST 15 - 41 U/L 21  ALT 0 - 44 U/L 16   CBC Latest Ref Rng & Units 09/07/2021  WBC 4.0 - 10.5 K/uL 3.8(L)  Hemoglobin 12.0 - 15.0 g/dL 10.3(L)  Hematocrit 36.0 - 46.0 % 32.1(L)  Platelets 150 - 400 K/uL 211    No images are attached to the encounter.  US Venous Img Lower Bilateral (DVT)  Result Date: 09/01/2021 CLINICAL DATA:  Bilateral lower extremity edema for 2-3 weeks EXAM: BILATERAL LOWER EXTREMITY VENOUS DOPPLER ULTRASOUND TECHNIQUE: Gray-scale sonography with compression, as well as color and duplex ultrasound, were performed to evaluate the deep venous system(s) from the level of the common femoral vein through the popliteal and proximal calf veins. COMPARISON:  None. FINDINGS: VENOUS Normal compressibility of the common femoral, superficial femoral, and popliteal veins, as well as the visualized calf veins.  Visualized portions of profunda femoral vein and great saphenous vein unremarkable. No filling defects to suggest DVT on grayscale or color Doppler imaging. Doppler waveforms show normal direction of venous flow, normal  respiratory plasticity and response to augmentation. OTHER Mildly enlarged morphologically abnormal lymph nodes seen within the inguinal regions bilaterally. Limitations: none IMPRESSION: 1. No lower extremity DVT. 2. Mildly enlarged morphologically abnormal lymph nodes noted with the inguinal region bilaterally. While these may be inflammatory, metastatic disease should also be considered given patient's history of malignancy. Electronically Signed   By: Miachel Roux M.D.   On: 09/01/2021 11:41   VAS US CAROTID  Result Date: 09/23/2021 Carotid Arterial Duplex Study Patient Name:  LINDZIE BOXX [facility]  Date of Exam:   09/14/2021 Medical Rec #: [id number]          Accession #:    [id number] Date of Birth: 1951/06/20           Patient Gender: F Patient Age:   22 years Exam Location:  [facility] Procedure:      VAS US CAROTID Referring Phys: Hortencia Pilar --------------------------------------------------------------------------------  Indications: Right bruit. Performing Technologist: Concha Norway RVT  Examination Guidelines: A complete evaluation includes B-mode imaging, spectral Doppler, color Doppler, and power Doppler as needed of all accessible portions of each vessel. Bilateral testing is considered an integral part of a complete examination. Limited examinations for reoccurring indications may be performed as noted.  Right Carotid Findings: +----------+--------+--------+--------+------------------+--------+           PSV cm/sEDV cm/sStenosisPlaque DescriptionComments +----------+--------+--------+--------+------------------+--------+ CCA Prox  77      12                                         +----------+--------+--------+--------+------------------+--------+ CCA  Mid   92      18                                         +----------+--------+--------+--------+------------------+--------+ CCA Distal69      13                                         +----------+--------+--------+--------+------------------+--------+ ICA Prox  40      11                                         +----------+--------+--------+--------+------------------+--------+ ICA Mid   49      15                                         +----------+--------+--------+--------+------------------+--------+ ICA Distal55      19                                         +----------+--------+--------+--------+------------------+--------+ ECA       56      10                                         +----------+--------+--------+--------+------------------+--------+ +----------+--------+-------+----------------+-------------------+  PSV cm/sEDV cmsDescribe        Arm Pressure (mmHG) +----------+--------+-------+----------------+-------------------+ Subclavian115            Multiphasic, WNL                    +----------+--------+-------+----------------+-------------------+ +---------+--------+--+--------+--+---------+ VertebralPSV cm/s45EDV cm/s13Antegrade +---------+--------+--+--------+--+---------+  Left Carotid Findings: +----------+--------+--------+--------+------------------+--------+           PSV cm/sEDV cm/sStenosisPlaque DescriptionComments +----------+--------+--------+--------+------------------+--------+ CCA Prox  87      20                                         +----------+--------+--------+--------+------------------+--------+ CCA Mid   73      16                                         +----------+--------+--------+--------+------------------+--------+ CCA Distal59      15                                         +----------+--------+--------+--------+------------------+--------+ ICA Prox  37      13                                          +----------+--------+--------+--------+------------------+--------+ ICA Mid   54      18                                         +----------+--------+--------+--------+------------------+--------+ ICA Distal48      13                                         +----------+--------+--------+--------+------------------+--------+ ECA       46      4                                          +----------+--------+--------+--------+------------------+--------+ +----------+--------+--------+----------------+-------------------+           PSV cm/sEDV cm/sDescribe        Arm Pressure (mmHG) +----------+--------+--------+----------------+-------------------+ [id number]             Multiphasic, WNL                    +----------+--------+--------+----------------+-------------------+ +---------+--------+--+--------+--+---------+ VertebralPSV cm/s45EDV cm/s13Antegrade +---------+--------+--+--------+--+---------+   Summary: Right Carotid: There was no evidence of thrombus, dissection, atherosclerotic                plaque or stenosis in the cervical carotid system. Left Carotid: There was no evidence of thrombus, dissection, atherosclerotic               plaque or stenosis in the cervical carotid system. Moderately               tortuous ICA with no plaque seen. Vertebrals:  Bilateral vertebral arteries demonstrate antegrade flow. Subclavians: Normal flow hemodynamics  were seen in bilateral subclavian              arteries. *See table(s) above for measurements and observations.  Electronically signed by Hortencia Pilar MD on 09/23/2021 at 3:29:57 PM.    Final      Assessment and plan- Patient is a 70 y.o. female  with extensive stage small cell lung cancer with liver metastases.  She is here for on treatment assessment prior to cycle 10 of maintenance Tecentriq  Counts okay to proceed with cycle 10 of maintenance Tecentriq today and I will see  her back in 3 weeks for cycle 11.  She is scheduled for CT and bone scan prior.  Bilateral leg swelling: Possibly secondary to lymphedema.  Overall better after wearing compression stockings.  Continue to monitor  Neoplasm related pain: Continue as needed oxycodone.  She also has fentanyl patches at home which she uses occasionally but not consistently   Visit Diagnosis 1. Encounter for antineoplastic immunotherapy   2. Small cell carcinoma of lung metastatic to liver [facility])      Dr. Randa Evens, MD, MPH [facility] [phone] 09/27/2021 4:48 PM

## 2021-09-28 ENCOUNTER — Other Ambulatory Visit: Payer: Self-pay

## 2021-09-28 ENCOUNTER — Inpatient Hospital Stay: Payer: Medicare HMO

## 2021-09-28 ENCOUNTER — Other Ambulatory Visit: Payer: Self-pay | Admitting: *Deleted

## 2021-09-28 ENCOUNTER — Other Ambulatory Visit: Payer: Self-pay | Admitting: Oncology

## 2021-09-28 ENCOUNTER — Encounter: Payer: Self-pay | Admitting: Oncology

## 2021-09-28 ENCOUNTER — Ambulatory Visit
Admission: RE | Admit: 2021-09-28 | Discharge: 2021-09-28 | Disposition: A | Discharge status: Home / Self Care | Payer: Medicare HMO | Source: Ambulatory Visit | Attending: Oncology | Admitting: Oncology

## 2021-09-28 ENCOUNTER — Inpatient Hospital Stay: Payer: Medicare HMO | Attending: Oncology

## 2021-09-28 ENCOUNTER — Inpatient Hospital Stay (HOSPITAL_BASED_OUTPATIENT_CLINIC_OR_DEPARTMENT_OTHER): Payer: Medicare HMO | Admitting: Oncology

## 2021-09-28 VITALS — BP 142/76 | HR 59 | Temp 98.2°F | Resp 16 | Ht 66.0 in | Wt 159.7 lb

## 2021-09-28 DIAGNOSIS — Z8349 Family history of other endocrine, nutritional and metabolic diseases: Secondary | ICD-10-CM | POA: Insufficient documentation

## 2021-09-28 DIAGNOSIS — Z5112 Encounter for antineoplastic immunotherapy: Secondary | ICD-10-CM | POA: Insufficient documentation

## 2021-09-28 DIAGNOSIS — Z8249 Family history of ischemic heart disease and other diseases of the circulatory system: Secondary | ICD-10-CM | POA: Insufficient documentation

## 2021-09-28 DIAGNOSIS — C3490 Malignant neoplasm of unspecified part of unspecified bronchus or lung: Secondary | ICD-10-CM

## 2021-09-28 DIAGNOSIS — C3412 Malignant neoplasm of upper lobe, left bronchus or lung: Secondary | ICD-10-CM | POA: Insufficient documentation

## 2021-09-28 DIAGNOSIS — R590 Localized enlarged lymph nodes: Secondary | ICD-10-CM | POA: Insufficient documentation

## 2021-09-28 DIAGNOSIS — Z9221 Personal history of antineoplastic chemotherapy: Secondary | ICD-10-CM | POA: Insufficient documentation

## 2021-09-28 DIAGNOSIS — G893 Neoplasm related pain (acute) (chronic): Secondary | ICD-10-CM | POA: Insufficient documentation

## 2021-09-28 DIAGNOSIS — C787 Secondary malignant neoplasm of liver and intrahepatic bile duct: Secondary | ICD-10-CM | POA: Diagnosis not present

## 2021-09-28 DIAGNOSIS — Z803 Family history of malignant neoplasm of breast: Secondary | ICD-10-CM | POA: Insufficient documentation

## 2021-09-28 DIAGNOSIS — M7989 Other specified soft tissue disorders: Secondary | ICD-10-CM | POA: Insufficient documentation

## 2021-09-28 DIAGNOSIS — R600 Localized edema: Secondary | ICD-10-CM | POA: Insufficient documentation

## 2021-09-28 DIAGNOSIS — E119 Type 2 diabetes mellitus without complications: Secondary | ICD-10-CM | POA: Insufficient documentation

## 2021-09-28 DIAGNOSIS — I10 Essential (primary) hypertension: Secondary | ICD-10-CM | POA: Insufficient documentation

## 2021-09-28 DIAGNOSIS — Z79899 Other long term (current) drug therapy: Secondary | ICD-10-CM | POA: Insufficient documentation

## 2021-09-28 DIAGNOSIS — E049 Nontoxic goiter, unspecified: Secondary | ICD-10-CM | POA: Insufficient documentation

## 2021-09-28 DIAGNOSIS — Z5941 Food insecurity: Secondary | ICD-10-CM | POA: Insufficient documentation

## 2021-09-28 DIAGNOSIS — R0789 Other chest pain: Secondary | ICD-10-CM | POA: Insufficient documentation

## 2021-09-28 DIAGNOSIS — T82598A Other mechanical complication of other cardiac and vascular devices and implants, initial encounter: Secondary | ICD-10-CM | POA: Diagnosis not present

## 2021-09-28 DIAGNOSIS — R5383 Other fatigue: Secondary | ICD-10-CM | POA: Insufficient documentation

## 2021-09-28 DIAGNOSIS — Z5982 Transportation insecurity: Secondary | ICD-10-CM | POA: Insufficient documentation

## 2021-09-28 HISTORY — PX: IR CV LINE INJECTION: IMG2294

## 2021-09-28 LAB — CBC WITH DIFFERENTIAL/PLATELET
Abs Immature Granulocytes: 0.01 10*3/uL (ref 0.00–0.07)
Basophils Absolute: 0 10*3/uL (ref 0.0–0.1)
Basophils Relative: 0 %
Eosinophils Absolute: 0.1 10*3/uL (ref 0.0–0.5)
Eosinophils Relative: 2 %
HCT: 34.8 % — ABNORMAL LOW (ref 36.0–46.0)
Hemoglobin: 11.2 g/dL — ABNORMAL LOW (ref 12.0–15.0)
Immature Granulocytes: 0 %
Lymphocytes Relative: 15 %
Lymphs Abs: 0.7 10*3/uL (ref 0.7–4.0)
MCH: 28.1 pg (ref 26.0–34.0)
MCHC: 32.2 g/dL (ref 30.0–36.0)
MCV: 87.2 fL (ref 80.0–100.0)
Monocytes Absolute: 0.4 10*3/uL (ref 0.1–1.0)
Monocytes Relative: 9 %
Neutro Abs: 3.3 10*3/uL (ref 1.7–7.7)
Neutrophils Relative %: 74 %
Platelets: 228 10*3/uL (ref 150–400)
RBC: 3.99 MIL/uL (ref 3.87–5.11)
RDW: 14.6 % (ref 11.5–15.5)
WBC: 4.4 10*3/uL (ref 4.0–10.5)
nRBC: 0 % (ref 0.0–0.2)

## 2021-09-28 LAB — COMPREHENSIVE METABOLIC PANEL
ALT: 16 U/L (ref 0–44)
AST: 21 U/L (ref 15–41)
Albumin: 3.7 g/dL (ref 3.5–5.0)
Alkaline Phosphatase: 71 U/L (ref 38–126)
Anion gap: 6 (ref 5–15)
BUN: 11 mg/dL (ref 8–23)
CO2: 27 mmol/L (ref 22–32)
Calcium: 9.3 mg/dL (ref 8.9–10.3)
Chloride: 104 mmol/L (ref 98–111)
Creatinine, Ser: 0.68 mg/dL (ref 0.44–1.00)
GFR, Estimated: >60 mL/min (ref >60)
Glucose, Bld: 109 mg/dL — ABNORMAL HIGH (ref 70–99)
Potassium: 3.6 mmol/L (ref 3.5–5.1)
Sodium: 137 mmol/L (ref 135–145)
Total Bilirubin: 0.5 mg/dL (ref 0.3–1.2)
Total Protein: 7 g/dL (ref 6.5–8.1)

## 2021-09-28 MED ORDER — SODIUM CHLORIDE 0.9 % IV SOLN
Freq: Once | INTRAVENOUS | Status: AC
  Filled 2021-09-28: qty 250

## 2021-09-28 MED ORDER — IOHEXOL 350 MG/ML SOLN
10.0000 mL | Freq: Once | INTRAVENOUS | Status: AC | PRN
  Administered 2021-09-28: 10 mL
  Filled 2021-09-28: qty 10

## 2021-09-28 MED ORDER — HEPARIN SOD (PORK) LOCK FLUSH 100 UNIT/ML IV SOLN
INTRAVENOUS | Status: AC
  Administered 2021-09-28: 500 [IU]
  Filled 2021-09-28: qty 5

## 2021-09-28 MED ORDER — SODIUM CHLORIDE 0.9 % IV SOLN
1200.0000 mg | Freq: Once | INTRAVENOUS | Status: AC
  Administered 2021-09-28: 1200 mg via INTRAVENOUS
  Filled 2021-09-28: qty 20

## 2021-09-28 NOTE — Progress Notes (Signed)
Pt states that she has been taking 1 lasix a day for the swelling and 1 potassium a day. Her RX says 2 pills a day for the lasix and the potassium pill. The fluid is better and it was from ankle to mid thigh and she thinks it is better but not gone all the way she states the thighs are still swollen. She does have the compression socks on. She has good bowels as long as she uses miralax

## 2021-09-28 NOTE — Progress Notes (Signed)
Pt having no blood draw, will need dye study

## 2021-09-28 NOTE — Progress Notes (Signed)
Left accessed as pt is going for dye study

## 2021-09-28 NOTE — Progress Notes (Signed)
Nutrition Follow-up:  Patient with small cell lung cancer with metastatic disease.  Patient receiving tecentriq.    Met with patient during infusion.  Patient reports that appetite is better.  Eating 2-3 meals per day. Drinking ensure original. Does not like the plus shakes.  Denies any nutrition impact symptoms.    Noted seen by vascular for leg edema.      Medications: reviewed  Labs: reviewed  Anthropometrics:   Weight 159 lb (leg edema)  157 lb 12.8 oz on 10/25 123 lb 6.4 oz on 8/2 118 lb on 7/12 121 lb 14.4 oz on 6/21 123 lb on 5/31 124 lb on 3/22 138 lb on 2/8 147 lb on 1/8   NUTRITION DIAGNOSIS: Inadequate oral intake improving/stable   INTERVENTION:  Continue oral nutrition supplements. Coupons given Patient to focus on foods high in protein.      MONITORING, EVALUATION, GOAL: weight trends, intake   NEXT VISIT: to be determined with treatment  Anshu Wehner B. Zenia Resides, [place], [place] Registered Dietitian [phone] (mobile)

## 2021-09-28 NOTE — Patient Instructions (Signed)
[facility]  Discharge Instructions: Thank you for choosing [facility] to provide your oncology and hematology care.  If you have a lab appointment with the [facility], please go directly to the [facility] and check in at the registration area.  Wear comfortable clothing and clothing appropriate for easy access to any Portacath or PICC line.   We strive to give you quality time with your provider. You may need to reschedule your appointment if you arrive late (15 or more minutes).  Arriving late affects you and other patients whose appointments are after yours.  Also, if you miss three or more appointments without notifying the office, you may be dismissed from the clinic at the provider's discretion.      For prescription refill requests, have your pharmacy contact our office and allow 72 hours for refills to be completed.    Today you received the following chemotherapy and/or immunotherapy agents TECENTRIQ      To help prevent nausea and vomiting after your treatment, we encourage you to take your nausea medication as directed.  BELOW ARE SYMPTOMS THAT SHOULD BE REPORTED IMMEDIATELY: *FEVER GREATER THAN 100.4 F (38 C) OR HIGHER *CHILLS OR SWEATING *NAUSEA AND VOMITING THAT IS NOT CONTROLLED WITH YOUR NAUSEA MEDICATION *UNUSUAL SHORTNESS OF BREATH *UNUSUAL BRUISING OR BLEEDING *URINARY PROBLEMS (pain or burning when urinating, or frequent urination) *BOWEL PROBLEMS (unusual diarrhea, constipation, pain near the anus) TENDERNESS IN MOUTH AND THROAT WITH OR WITHOUT PRESENCE OF ULCERS (sore throat, sores in mouth, or a toothache) UNUSUAL RASH, SWELLING OR PAIN  UNUSUAL VAGINAL DISCHARGE OR ITCHING   Items with * indicate a potential emergency and should be followed up as soon as possible or go to the Emergency Department if any problems should occur.  Please show the CHEMOTHERAPY ALERT CARD or IMMUNOTHERAPY ALERT CARD at check-in  to the Emergency Department and triage nurse.  Should you have questions after your visit or need to cancel or reschedule your appointment, please contact [facility]  [phone] and follow the prompts.  Office hours are 8:00 a.m. to 4:30 p.m. Monday - Friday. Please note that voicemails left after 4:00 p.m. may not be returned until the following business day.  We are closed weekends and major holidays. You have access to a nurse at all times for urgent questions. Please call the main number to the clinic [phone] and follow the prompts.  For any non-urgent questions, you may also contact your provider using MyChart. We now offer e-Visits for anyone 74 and older to request care online for non-urgent symptoms. For details visit [email or web address].   Also download the MyChart app! Go to the app store, search "MyChart", open the app, select [facility], and log in with your MyChart username and password.  Due to Covid, a mask is required upon entering the hospital/clinic. If you do not have a mask, one will be given to you upon arrival. For doctor visits, patients may have 1 support person aged 74 or older with them. For treatment visits, patients cannot have anyone with them due to current Covid guidelines and our immunocompromised population.   Atezolizumab injection What is this medication? ATEZOLIZUMAB (a te zoe LIZ ue mab) is a monoclonal antibody. It is used to treat bladder cancer (urothelial cancer), liver cancer, lung cancer, and melanoma. This medicine may be used for other purposes; ask your health care provider or pharmacist if you have questions. COMMON BRAND NAME(S): [employer]  What should I tell my care team before I take this medication? They need to know if you have any of these conditions: autoimmune diseases like Crohn's disease, ulcerative colitis, or lupus have had or planning to have an allogeneic stem cell transplant (uses someone  else's stem cells) history of organ transplant history of radiation to the chest nervous system problems like myasthenia gravis or Guillain-Barre syndrome an unusual or allergic reaction to atezolizumab, other medicines, foods, dyes, or preservatives pregnant or trying to get pregnant breast-feeding How should I use this medication? This medicine is for infusion into a vein. It is given by a health care professional in a hospital or clinic setting. A special MedGuide will be given to you before each treatment. Be sure to read this information carefully each time. Talk to your pediatrician regarding the use of this medicine in children. Special care may be needed. Overdosage: If you think you have taken too much of this medicine contact a poison control center or emergency room at once. NOTE: This medicine is only for you. Do not share this medicine with others. What if I miss a dose? It is important not to miss your dose. Call your doctor or health care professional if you are unable to keep an appointment. What may interact with this medication? Interactions have not been studied. This list may not describe all possible interactions. Give your health care provider a list of all the medicines, herbs, non-prescription drugs, or dietary supplements you use. Also tell them if you smoke, drink alcohol, or use illegal drugs. Some items may interact with your medicine. What should I watch for while using this medication? Your condition will be monitored carefully while you are receiving this medicine. You may need blood work done while you are taking this medicine. Do not become pregnant while taking this medicine or for at least 5 months after stopping it. Women should inform their doctor if they wish to become pregnant or think they might be pregnant. There is a potential for serious side effects to an unborn child. Talk to your health care professional or pharmacist for more information. Do not  breast-feed an infant while taking this medicine or for at least 5 months after the last dose. What side effects may I notice from receiving this medication? Side effects that you should report to your doctor or health care professional as soon as possible: allergic reactions like skin rash, itching or hives, swelling of the face, lips, or tongue black, tarry stools bloody or watery diarrhea breathing problems changes in vision chest pain or chest tightness chills facial flushing fever headache signs and symptoms of high blood sugar such as dizziness; dry mouth; dry skin; fruity breath; nausea; stomach pain; increased hunger or thirst; increased urination signs and symptoms of liver injury like dark yellow or brown urine; general ill feeling or flu-like symptoms; light-colored stools; loss of appetite; nausea; right upper belly pain; unusually weak or tired; yellowing of the eyes or skin stomach pain trouble passing urine or change in the amount of urine Side effects that usually do not require medical attention (report to your doctor or health care professional if they continue or are bothersome): bone pain cough diarrhea joint pain muscle pain muscle weakness swelling of arms or legs tiredness weight loss This list may not describe all possible side effects. Call your doctor for medical advice about side effects. You may report side effects to FDA at 1-800-FDA-1088. Where should I keep my medication? This drug  is given in a hospital or clinic and will not be stored at home. NOTE: This sheet is a summary. It may not cover all possible information. If you have questions about this medicine, talk to your doctor, pharmacist, or health care provider.  2022 Elsevier/Gold Standard (2021-07-20 00:00:00)

## 2021-10-01 ENCOUNTER — Ambulatory Visit
Admission: RE | Admit: 2021-10-01 | Discharge: 2021-10-01 | Disposition: A | Discharge status: Home / Self Care | Payer: Medicare HMO | Source: Ambulatory Visit | Attending: Oncology | Admitting: Oncology

## 2021-10-01 DIAGNOSIS — I2510 Atherosclerotic heart disease of native coronary artery without angina pectoris: Secondary | ICD-10-CM | POA: Diagnosis not present

## 2021-10-01 DIAGNOSIS — I700 Atherosclerosis of aorta: Secondary | ICD-10-CM | POA: Diagnosis not present

## 2021-10-01 DIAGNOSIS — J701 Chronic and other pulmonary manifestations due to radiation: Secondary | ICD-10-CM | POA: Diagnosis not present

## 2021-10-01 DIAGNOSIS — K7689 Other specified diseases of liver: Secondary | ICD-10-CM | POA: Diagnosis not present

## 2021-10-01 DIAGNOSIS — J941 Fibrothorax: Secondary | ICD-10-CM | POA: Diagnosis not present

## 2021-10-01 DIAGNOSIS — J9859 Other diseases of mediastinum, not elsewhere classified: Secondary | ICD-10-CM | POA: Diagnosis not present

## 2021-10-01 DIAGNOSIS — C3490 Malignant neoplasm of unspecified part of unspecified bronchus or lung: Secondary | ICD-10-CM | POA: Insufficient documentation

## 2021-10-01 MED ORDER — IOHEXOL 300 MG/ML  SOLN
100.0000 mL | Freq: Once | INTRAMUSCULAR | Status: AC | PRN
  Administered 2021-10-01: 100 mL via INTRAVENOUS

## 2021-10-05 ENCOUNTER — Encounter
Admission: RE | Admit: 2021-10-05 | Discharge: 2021-10-05 | Disposition: A | Discharge status: Home / Self Care | Payer: Medicare HMO | Source: Ambulatory Visit | Attending: Oncology | Admitting: Oncology

## 2021-10-05 DIAGNOSIS — C3490 Malignant neoplasm of unspecified part of unspecified bronchus or lung: Secondary | ICD-10-CM | POA: Diagnosis not present

## 2021-10-05 DIAGNOSIS — M19011 Primary osteoarthritis, right shoulder: Secondary | ICD-10-CM | POA: Diagnosis not present

## 2021-10-05 DIAGNOSIS — M160 Bilateral primary osteoarthritis of hip: Secondary | ICD-10-CM | POA: Diagnosis not present

## 2021-10-05 DIAGNOSIS — M170 Bilateral primary osteoarthritis of knee: Secondary | ICD-10-CM | POA: Diagnosis not present

## 2021-10-05 MED ORDER — TECHNETIUM TC 99M MEDRONATE IV KIT
20.0000 | PACK | Freq: Once | INTRAVENOUS | Status: AC | PRN
  Administered 2021-10-05: 20.59 via INTRAVENOUS

## 2021-10-12 DIAGNOSIS — Z711 Person with feared health complaint in whom no diagnosis is made: Secondary | ICD-10-CM | POA: Diagnosis not present

## 2021-10-19 ENCOUNTER — Inpatient Hospital Stay (HOSPITAL_BASED_OUTPATIENT_CLINIC_OR_DEPARTMENT_OTHER): Payer: Medicare HMO | Admitting: Oncology

## 2021-10-19 ENCOUNTER — Encounter: Payer: Self-pay | Admitting: Oncology

## 2021-10-19 ENCOUNTER — Other Ambulatory Visit: Payer: Self-pay

## 2021-10-19 ENCOUNTER — Inpatient Hospital Stay: Payer: Medicare HMO

## 2021-10-19 ENCOUNTER — Inpatient Hospital Stay: Payer: Medicare HMO | Attending: Oncology

## 2021-10-19 VITALS — BP 156/70 | HR 63 | Temp 98.0°F | Resp 16 | Wt 161.4 lb

## 2021-10-19 DIAGNOSIS — Z8249 Family history of ischemic heart disease and other diseases of the circulatory system: Secondary | ICD-10-CM | POA: Insufficient documentation

## 2021-10-19 DIAGNOSIS — R590 Localized enlarged lymph nodes: Secondary | ICD-10-CM | POA: Diagnosis not present

## 2021-10-19 DIAGNOSIS — E876 Hypokalemia: Secondary | ICD-10-CM | POA: Insufficient documentation

## 2021-10-19 DIAGNOSIS — I10 Essential (primary) hypertension: Secondary | ICD-10-CM | POA: Diagnosis not present

## 2021-10-19 DIAGNOSIS — C3490 Malignant neoplasm of unspecified part of unspecified bronchus or lung: Secondary | ICD-10-CM

## 2021-10-19 DIAGNOSIS — I700 Atherosclerosis of aorta: Secondary | ICD-10-CM | POA: Diagnosis not present

## 2021-10-19 DIAGNOSIS — R5383 Other fatigue: Secondary | ICD-10-CM | POA: Diagnosis not present

## 2021-10-19 DIAGNOSIS — C787 Secondary malignant neoplasm of liver and intrahepatic bile duct: Secondary | ICD-10-CM | POA: Insufficient documentation

## 2021-10-19 DIAGNOSIS — Z5112 Encounter for antineoplastic immunotherapy: Secondary | ICD-10-CM

## 2021-10-19 DIAGNOSIS — Z5982 Transportation insecurity: Secondary | ICD-10-CM | POA: Diagnosis not present

## 2021-10-19 DIAGNOSIS — G893 Neoplasm related pain (acute) (chronic): Secondary | ICD-10-CM | POA: Diagnosis not present

## 2021-10-19 DIAGNOSIS — M7989 Other specified soft tissue disorders: Secondary | ICD-10-CM | POA: Insufficient documentation

## 2021-10-19 DIAGNOSIS — Z5941 Food insecurity: Secondary | ICD-10-CM | POA: Insufficient documentation

## 2021-10-19 DIAGNOSIS — Z803 Family history of malignant neoplasm of breast: Secondary | ICD-10-CM | POA: Insufficient documentation

## 2021-10-19 DIAGNOSIS — Z9221 Personal history of antineoplastic chemotherapy: Secondary | ICD-10-CM | POA: Diagnosis not present

## 2021-10-19 DIAGNOSIS — Z923 Personal history of irradiation: Secondary | ICD-10-CM | POA: Insufficient documentation

## 2021-10-19 DIAGNOSIS — Z87891 Personal history of nicotine dependence: Secondary | ICD-10-CM | POA: Diagnosis not present

## 2021-10-19 DIAGNOSIS — E119 Type 2 diabetes mellitus without complications: Secondary | ICD-10-CM | POA: Insufficient documentation

## 2021-10-19 DIAGNOSIS — R0789 Other chest pain: Secondary | ICD-10-CM | POA: Diagnosis not present

## 2021-10-19 DIAGNOSIS — Z79899 Other long term (current) drug therapy: Secondary | ICD-10-CM | POA: Insufficient documentation

## 2021-10-19 DIAGNOSIS — E049 Nontoxic goiter, unspecified: Secondary | ICD-10-CM | POA: Diagnosis not present

## 2021-10-19 DIAGNOSIS — Z8349 Family history of other endocrine, nutritional and metabolic diseases: Secondary | ICD-10-CM | POA: Diagnosis not present

## 2021-10-19 DIAGNOSIS — C3412 Malignant neoplasm of upper lobe, left bronchus or lung: Secondary | ICD-10-CM | POA: Diagnosis not present

## 2021-10-19 LAB — COMPREHENSIVE METABOLIC PANEL
ALT: 14 U/L (ref 0–44)
AST: 19 U/L (ref 15–41)
Albumin: 3.6 g/dL (ref 3.5–5.0)
Alkaline Phosphatase: 81 U/L (ref 38–126)
Anion gap: 9 (ref 5–15)
BUN: 14 mg/dL (ref 8–23)
CO2: 25 mmol/L (ref 22–32)
Calcium: 9.7 mg/dL (ref 8.9–10.3)
Chloride: 104 mmol/L (ref 98–111)
Creatinine, Ser: 0.58 mg/dL (ref 0.44–1.00)
GFR, Estimated: >60 mL/min (ref >60)
Glucose, Bld: 131 mg/dL — ABNORMAL HIGH (ref 70–99)
Potassium: 3.2 mmol/L — ABNORMAL LOW (ref 3.5–5.1)
Sodium: 138 mmol/L (ref 135–145)
Total Bilirubin: 0.4 mg/dL (ref 0.3–1.2)
Total Protein: 6.7 g/dL (ref 6.5–8.1)

## 2021-10-19 LAB — CBC WITH DIFFERENTIAL/PLATELET
Abs Immature Granulocytes: 0.01 10*3/uL (ref 0.00–0.07)
Basophils Absolute: 0 10*3/uL (ref 0.0–0.1)
Basophils Relative: 0 %
Eosinophils Absolute: 0.2 10*3/uL (ref 0.0–0.5)
Eosinophils Relative: 4 %
HCT: 33.3 % — ABNORMAL LOW (ref 36.0–46.0)
Hemoglobin: 10.6 g/dL — ABNORMAL LOW (ref 12.0–15.0)
Immature Granulocytes: 0 %
Lymphocytes Relative: 21 %
Lymphs Abs: 0.9 10*3/uL (ref 0.7–4.0)
MCH: 27.6 pg (ref 26.0–34.0)
MCHC: 31.8 g/dL (ref 30.0–36.0)
MCV: 86.7 fL (ref 80.0–100.0)
Monocytes Absolute: 0.5 10*3/uL (ref 0.1–1.0)
Monocytes Relative: 11 %
Neutro Abs: 2.8 10*3/uL (ref 1.7–7.7)
Neutrophils Relative %: 64 %
Platelets: 224 10*3/uL (ref 150–400)
RBC: 3.84 MIL/uL — ABNORMAL LOW (ref 3.87–5.11)
RDW: 14.5 % (ref 11.5–15.5)
WBC: 4.3 10*3/uL (ref 4.0–10.5)
nRBC: 0 % (ref 0.0–0.2)

## 2021-10-19 MED ORDER — SODIUM CHLORIDE 0.9 % IV SOLN
Freq: Once | INTRAVENOUS | Status: AC
Start: 2021-10-19 — End: 2021-10-19
  Filled 2021-10-19: qty 250

## 2021-10-19 MED ORDER — POTASSIUM CHLORIDE CRYS ER 20 MEQ PO TBCR: 40.0000 meq | EXTENDED_RELEASE_TABLET | Freq: Every day | ORAL | 0 refills | Status: DC

## 2021-10-19 MED ORDER — HEPARIN SOD (PORK) LOCK FLUSH 100 UNIT/ML IV SOLN
500.0000 [IU] | Freq: Once | INTRAVENOUS | Status: AC
  Administered 2021-10-19: 500 [IU] via INTRAVENOUS
  Filled 2021-10-19: qty 5

## 2021-10-19 MED ORDER — FUROSEMIDE 20 MG PO TABS
20.0000 mg | ORAL_TABLET | Freq: Every day | ORAL | 0 refills | Status: DC
Start: 2021-10-19 — End: 2023-05-09

## 2021-10-19 MED ORDER — SODIUM CHLORIDE 0.9 % IV SOLN
1200.0000 mg | Freq: Once | INTRAVENOUS | Status: AC
  Administered 2021-10-19: 1200 mg via INTRAVENOUS
  Filled 2021-10-19: qty 20

## 2021-10-19 MED ORDER — SODIUM CHLORIDE 0.9% FLUSH
10.0000 mL | Freq: Once | INTRAVENOUS | Status: AC
  Administered 2021-10-19: 10 mL via INTRAVENOUS
  Filled 2021-10-19: qty 10

## 2021-10-19 NOTE — Progress Notes (Signed)
Pt states she is still experiencing fluid in her legs and feet. Today, rt leg seems to be feeling worse.

## 2021-10-19 NOTE — Patient Instructions (Signed)
[facility] [place]  Discharge Instructions: Thank you for choosing [facility] to provide your oncology and hematology care.  If you have a lab appointment with the [facility], please go directly to the [facility] and check in at the registration area.  Wear comfortable clothing and clothing appropriate for easy access to any Portacath or PICC line.   We strive to give you quality time with your provider. You may need to reschedule your appointment if you arrive late (15 or more minutes).  Arriving late affects you and other patients whose appointments are after yours.  Also, if you miss three or more appointments without notifying the office, you may be dismissed from the clinic at the provider's discretion.      For prescription refill requests, have your pharmacy contact our office and allow 72 hours for refills to be completed.    Today you received the following chemotherapy and/or immunotherapy agents: Tecentriq      To help prevent nausea and vomiting after your treatment, we encourage you to take your nausea medication as directed.  BELOW ARE SYMPTOMS THAT SHOULD BE REPORTED IMMEDIATELY: *FEVER GREATER THAN 100.4 F (38 C) OR HIGHER *CHILLS OR SWEATING *NAUSEA AND VOMITING THAT IS NOT CONTROLLED WITH YOUR NAUSEA MEDICATION *UNUSUAL SHORTNESS OF BREATH *UNUSUAL BRUISING OR BLEEDING *URINARY PROBLEMS (pain or burning when urinating, or frequent urination) *BOWEL PROBLEMS (unusual diarrhea, constipation, pain near the anus) TENDERNESS IN MOUTH AND THROAT WITH OR WITHOUT PRESENCE OF ULCERS (sore throat, sores in mouth, or a toothache) UNUSUAL RASH, SWELLING OR PAIN  UNUSUAL VAGINAL DISCHARGE OR ITCHING   Items with * indicate a potential emergency and should be followed up as soon as possible or go to the Emergency Department if any problems should occur.  Please show the CHEMOTHERAPY ALERT CARD or IMMUNOTHERAPY ALERT CARD at check-in to  the Emergency Department and triage nurse.  Should you have questions after your visit or need to cancel or reschedule your appointment, please contact [facility] [place] [facility] [place]  [phone] and follow the prompts.  Office hours are 8:00 a.m. to 4:30 p.m. Monday - Friday. Please note that voicemails left after 4:00 p.m. may not be returned until the following business day.  We are closed weekends and major holidays. You have access to a nurse at all times for urgent questions. Please call the main number to the clinic [phone] and follow the prompts.  For any non-urgent questions, you may also contact your provider using MyChart. We now offer e-Visits for anyone 40 and older to request care online for non-urgent symptoms. For details visit [email or web address].   Also download the MyChart app! Go to the app store, search "MyChart", open the app, select [facility], and log in with your MyChart username and password.  Due to Covid, a mask is required upon entering the hospital/clinic. If you do not have a mask, one will be given to you upon arrival. For doctor visits, patients may have 1 support person aged 48 or older with them. For treatment visits, patients cannot have anyone with them due to current Covid guidelines and our immunocompromised population. Atezolizumab injection What is this medication? ATEZOLIZUMAB (a te zoe LIZ ue mab) is a monoclonal antibody. It is used to treat bladder cancer (urothelial cancer), liver cancer, lung cancer, and melanoma. This medicine may be used for other purposes; ask your health care provider or pharmacist if you have questions. COMMON BRAND NAME(S): Tecentriq What should  I tell my care team before I take this medication? They need to know if you have any of these conditions: autoimmune diseases like Crohn's disease, ulcerative colitis, or lupus have had or planning to have an allogeneic stem cell transplant (uses someone else's stem  cells) history of organ transplant history of radiation to the chest nervous system problems like myasthenia gravis or Guillain-Barre syndrome an unusual or allergic reaction to atezolizumab, other medicines, foods, dyes, or preservatives pregnant or trying to get pregnant breast-feeding How should I use this medication? This medicine is for infusion into a vein. It is given by a health care professional in a hospital or clinic setting. A special MedGuide will be given to you before each treatment. Be sure to read this information carefully each time. Talk to your pediatrician regarding the use of this medicine in children. Special care may be needed. Overdosage: If you think you have taken too much of this medicine contact a poison control center or emergency room at once. NOTE: This medicine is only for you. Do not share this medicine with others. What if I miss a dose? It is important not to miss your dose. Call your doctor or health care professional if you are unable to keep an appointment. What may interact with this medication? Interactions have not been studied. This list may not describe all possible interactions. Give your health care provider a list of all the medicines, herbs, non-prescription drugs, or dietary supplements you use. Also tell them if you smoke, drink alcohol, or use illegal drugs. Some items may interact with your medicine. What should I watch for while using this medication? Your condition will be monitored carefully while you are receiving this medicine. You may need blood work done while you are taking this medicine. Do not become pregnant while taking this medicine or for at least 5 months after stopping it. Women should inform their doctor if they wish to become pregnant or think they might be pregnant. There is a potential for serious side effects to an unborn child. Talk to your health care professional or pharmacist for more information. Do not breast-feed an  infant while taking this medicine or for at least 5 months after the last dose. What side effects may I notice from receiving this medication? Side effects that you should report to your doctor or health care professional as soon as possible: allergic reactions like skin rash, itching or hives, swelling of the face, lips, or tongue black, tarry stools bloody or watery diarrhea breathing problems changes in vision chest pain or chest tightness chills facial flushing fever headache signs and symptoms of high blood sugar such as dizziness; dry mouth; dry skin; fruity breath; nausea; stomach pain; increased hunger or thirst; increased urination signs and symptoms of liver injury like dark yellow or brown urine; general ill feeling or flu-like symptoms; light-colored stools; loss of appetite; nausea; right upper belly pain; unusually weak or tired; yellowing of the eyes or skin stomach pain trouble passing urine or change in the amount of urine Side effects that usually do not require medical attention (report to your doctor or health care professional if they continue or are bothersome): bone pain cough diarrhea joint pain muscle pain muscle weakness swelling of arms or legs tiredness weight loss This list may not describe all possible side effects. Call your doctor for medical advice about side effects. You may report side effects to FDA at 1-800-FDA-1088. Where should I keep my medication? This drug is given  in a hospital or clinic and will not be stored at home. NOTE: This sheet is a summary. It may not cover all possible information. If you have questions about this medicine, talk to your doctor, pharmacist, or health care provider.  2022 Elsevier/Gold Standard (2021-07-20 00:00:00)

## 2021-10-19 NOTE — Progress Notes (Signed)
Hematology/Oncology Consult note [facility]  Telephone:([phone] Fax:([phone]  Patient Care Team: Leonel Ramsay, MD as PCP - General (Infectious Diseases) Telford Nab, RN as Oncology Nurse Navigator Sindy Guadeloupe, MD as Consulting Physician (Hematology and Oncology)   Name of the patient: Doris Johnson  [id number]  1951/09/12   Date of visit: 10/19/21  Diagnosis- extensive stage small cell lung cancer with liver metastases  Chief complaint/ Reason for visit-on treatment assessment prior to cycle 11 of maintenance Tecentriq  Heme/Onc history: Patient is a 70 year old female with a remote history of smoking in her teenage years and exposure to passive smoking.  She has been having ongoing midsternal pain which has been growing since the last 6 to 7 months.  She had been to urgent care as well in the past.  She finally had a CT chest with contrast done in October 2021 which showed a large mass in the left side of the mediastinum measuring 6.7 x 6.2 x 6.1 cm causing severe compression of the left main pulmonary artery and around the aortic arch in the left subclavian artery.  Enlarged thyroid gland.  Left upper lobe nodule 1 x 0.7 cm.  She then underwent bronchoscopy with Dr.Aleskerov left upper lobe biopsy was nondiagnostic.  However station 4, 7 and 10 L lymph nodes were consistent with metastatic small cell carcinoma.   PET CT scan showed enlarged level 3 cervical lymph node 2.2 cm that was hypermetabolic with an SUV of 9.7.  Large central 7.3 cm AP window mass.  5.7 cm left liver mass.  MRI brain with and without contrast showed 2 small foci of enhancement in the right cerebellar hemisphere and right temporal lobe without mass-effect or edema as well concerning for brain metastases   Patient had 2 cycles of carbo etoposide chemotherapy along with Tecentriq and subsequent scan showed no significant improvement in the primary lung mass or liver  mass.  She received radiation treatment to her primary lung mass and also seen by [facility] for second opinion.  Her pathology was reviewed at [facility] and reported as possible neuroendocrine neoplasm But stated that small cell carcinoma cannot be excluded but not definitely diagnostic for it.  However [facility] pathology feels that this is consistent with small cell lung cancer.  She has completed 4 cycles of carbo etoposide Tecentriq chemotherapy and is currently on maintenance Tecentriq.  Repeat liver biopsy was also performed and was consistent with small cell lung cancer      Interval history-tolerating treatment well so far.  Midsternal chest wall pain is decreasing.  She still has ongoing bilateral leg swelling for which she uses compression stockings and continues to take Lasix.  ECOG PS- 2 Pain scale- 0 Opioid associated constipation- no  Review of systems- Review of Systems  Constitutional:  Positive for malaise/fatigue. Negative for chills, fever and weight loss.  HENT:  Negative for congestion, ear discharge and nosebleeds.   Eyes:  Negative for blurred vision.  Respiratory:  Negative for cough, hemoptysis, sputum production, shortness of breath and wheezing.   Cardiovascular:  Positive for leg swelling. Negative for chest pain, palpitations, orthopnea and claudication.  Gastrointestinal:  Negative for abdominal pain, blood in stool, constipation, diarrhea, heartburn, melena, nausea and vomiting.  Genitourinary:  Negative for dysuria, flank pain, frequency, hematuria and urgency.  Musculoskeletal:  Negative for back pain, joint pain and myalgias.  Skin:  Negative for rash.  Neurological:  Negative for dizziness, tingling, focal weakness, seizures, weakness  and headaches.  Endo/Heme/Allergies:  Does not bruise/bleed easily.  Psychiatric/Behavioral:  Negative for depression and suicidal ideas. The patient does not have insomnia.     No Known Allergies   Past Medical History:  Diagnosis Date    Cancer ([place])    Cataract    Diabetes mellitus without complication ([place])    Hyperlipidemia    Hypertension    Hypothyroidism    doctor's keeping an eye on thyroid    Lung cancer [facility])      Past Surgical History:  Procedure Laterality Date   ABDOMINAL HYSTERECTOMY     IR CV LINE INJECTION  09/28/2021   PORTA CATH INSERTION N/A 11/30/2020   Procedure: PORTA CATH INSERTION;  Surgeon: Algernon Huxley, MD;  Location: [place] CV LAB;  Service: Cardiovascular;  Laterality: N/A;   VIDEO BRONCHOSCOPY WITH ENDOBRONCHIAL NAVIGATION N/A 10/21/2020   Procedure: VIDEO BRONCHOSCOPY WITH ENDOBRONCHIAL NAVIGATION;  Surgeon: Ottie Glazier, MD;  Location: [facility] ORS;  Service: Thoracic;  Laterality: N/A;   VIDEO BRONCHOSCOPY WITH ENDOBRONCHIAL ULTRASOUND N/A 10/21/2020   Procedure: VIDEO BRONCHOSCOPY WITH ENDOBRONCHIAL ULTRASOUND;  Surgeon: Ottie Glazier, MD;  Location: [facility] ORS;  Service: Thoracic;  Laterality: N/A;    Social History   Socioeconomic History   Marital status: Single    Spouse name: Not on file   Number of children: Not on file   Years of education: Not on file   Highest education level: Not on file  Occupational History   Not on file  Tobacco Use   Smoking status: Never   Smokeless tobacco: Never  Vaping Use   Vaping Use: Never used  Substance and Sexual Activity   Alcohol use: No   Drug use: No   Sexual activity: Not Currently  Other Topics Concern   Not on file  Social History Narrative   Not on file   Social Determinants of Health   Financial Resource Strain: Not on file  Food Insecurity: Food Insecurity Present   Worried About Running Out of Food in the Last Year: Sometimes true   Arboriculturist in the Last Year: Sometimes true  Transportation Needs: Unmet Transportation Needs   Lack of Transportation (Medical): Yes   Lack of Transportation (Non-Medical): Yes  Physical Activity: Not on file  Stress: Not on file  Social Connections: Not on file   Intimate Partner Violence: Not on file    Family History  Problem Relation Age of Onset   Cancer Mother        breast   Hypertension Mother    Breast cancer Mother 66   Heart disease Father    Hyperlipidemia Brother    Heart disease Brother      Current Outpatient Medications:    fluticasone (FLONASE) 50 MCG/ACT nasal spray, Place 2 sprays into both nostrils daily., Disp: 16 g, Rfl: 5   furosemide (LASIX) 20 MG tablet, Take 1 tablet (20 mg total) by mouth 2 (two) times daily., Disp: 60 tablet, Rfl: 0   mirtazapine (REMERON) 15 MG tablet, Take 15 mg by mouth at bedtime., Disp: , Rfl:    oxycodone (ROXICODONE) 30 MG immediate release tablet, Take 1 tablet (30 mg total) by mouth every 4 (four) hours as needed for pain., Disp: 180 tablet, Rfl: 0   polyethylene glycol (MIRALAX / GLYCOLAX) 17 g packet, Take 17 g by mouth daily., Disp: , Rfl:    potassium chloride SA (KLOR-CON) 20 MEQ tablet, Take 2 tablets (40 mEq total) by mouth daily., Disp:  7 tablet, Rfl: 0   lidocaine-prilocaine (EMLA) cream, Apply 1 application topically as needed. Apply small amount to port site at least 1 hour prior to it being accessed, cover with plastic wrap, Disp: 30 g, Rfl: 1   LORazepam (ATIVAN) 0.5 MG tablet, Take 1 tablet (0.5 mg total) by mouth every 6 (six) hours as needed (Nausea or vomiting). (Patient not taking: Reported on 09/28/2021), Disp: 30 tablet, Rfl: 0   naloxone (NARCAN) nasal spray 4 mg/0.1 mL, , Disp: , Rfl:    ondansetron (ZOFRAN) 8 MG tablet, Take 1 tablet (8 mg total) by mouth 2 (two) times daily as needed for refractory nausea / vomiting. Start on day 3 after carboplatin chemo. (Patient not taking: Reported on 10/19/2021), Disp: 30 tablet, Rfl: 1   prochlorperazine (COMPAZINE) 10 MG tablet, Take 1 tablet (10 mg total) by mouth every 6 (six) hours as needed (Nausea or vomiting). (Patient not taking: Reported on 10/19/2021), Disp: 30 tablet, Rfl: 1 No current facility-administered medications for  this visit.  Facility-Administered Medications Ordered in Other Visits:    atezolizumab (TECENTRIQ) 1,200 mg in sodium chloride 0.9 % 250 mL chemo infusion, 1,200 mg, Intravenous, Once, Sindy Guadeloupe, MD   heparin lock flush 100 unit/mL, 500 Units, Intravenous, Once, Sindy Guadeloupe, MD   sodium chloride flush (NS) 0.9 % injection 10 mL, 10 mL, Intravenous, PRN, Sindy Guadeloupe, MD, 10 mL at 12/15/20 0850  Physical exam:  Vitals:   10/19/21 0931  BP: (!) 156/70  Pulse: 63  Resp: 16  Temp: 98 F (36.7 C)  SpO2: 100%  Weight: 161 lb 6.4 oz (73.2 kg)   Physical Exam Constitutional:      General: She is not in acute distress. Cardiovascular:     Rate and Rhythm: Normal rate and regular rhythm.     Heart sounds: Normal heart sounds.  Pulmonary:     Effort: Pulmonary effort is normal.     Breath sounds: Normal breath sounds.  Abdominal:     General: Bowel sounds are normal.     Palpations: Abdomen is soft.  Musculoskeletal:     Comments: Bilaterally +1 edema and compression stockings in place  Skin:    General: Skin is warm and dry.  Neurological:     Mental Status: She is alert and oriented to person, place, and time.     CMP Latest Ref Rng & Units 10/19/2021  Glucose 70 - 99 mg/dL 131(H)  BUN 8 - 23 mg/dL 14  Creatinine 0.44 - 1.00 mg/dL 0.58  Sodium 135 - 145 mmol/L 138  Potassium 3.5 - 5.1 mmol/L 3.2(L)  Chloride 98 - 111 mmol/L 104  CO2 22 - 32 mmol/L 25  Calcium 8.9 - 10.3 mg/dL 9.7  Total Protein 6.5 - 8.1 g/dL 6.7  Total Bilirubin 0.3 - 1.2 mg/dL 0.4  Alkaline Phos 38 - 126 U/L 81  AST 15 - 41 U/L 19  ALT 0 - 44 U/L 14   CBC Latest Ref Rng & Units 10/19/2021  WBC 4.0 - 10.5 K/uL 4.3  Hemoglobin 12.0 - 15.0 g/dL 10.6(L)  Hematocrit 36.0 - 46.0 % 33.3(L)  Platelets 150 - 400 K/uL 224    No images are attached to the encounter.  NM Bone Scan Whole Body  Result Date: 10/05/2021 CLINICAL DATA:  Small cell lung cancer, sternal chest pain EXAM: NUCLEAR  MEDICINE WHOLE BODY BONE SCAN TECHNIQUE: Whole body anterior and posterior images were obtained approximately 3 hours after intravenous injection of radiopharmaceutical.  RADIOPHARMACEUTICALS:  20.59 mCi Technetium-12m MDP IV COMPARISON:  10/01/2021, 07/01/2021 FINDINGS: Anterior and posterior whole body planar images demonstrate normal physiologic excretion of radiotracer within the kidneys and bladder. Degenerative type activity is again seen at the bilateral shoulders, hips, and knees. Stable degenerative type activity in the region of the left calcaneus. No focal radiotracer uptake to suggest an acute or destructive process. There is no abnormal radiotracer uptake in the region of the L2 vertebral body to correspond with the lucent lesion seen on recent CT. No evidence of bony metastases. IMPRESSION: 1. Stable degenerative type activity as above. No evidence of bony metastases. Electronically Signed   By: Randa Ngo M.D.   On: 10/05/2021 21:37   CT CHEST ABDOMEN PELVIS W CONTRAST  Result Date: 10/01/2021 CLINICAL DATA:  Small-cell lung cancer.  Restaging. EXAM: CT CHEST, ABDOMEN, AND PELVIS WITH CONTRAST TECHNIQUE: Multidetector CT imaging of the chest, abdomen and pelvis was performed following the standard protocol during bolus administration of intravenous contrast. CONTRAST:  120mL OMNIPAQUE IOHEXOL 300 MG/ML  SOLN COMPARISON:  07/01/2021 FINDINGS: CT CHEST FINDINGS Cardiovascular: The heart size is normal. No substantial pericardial effusion. Mild atherosclerotic calcification is noted in the wall of the thoracic aorta. Right Port-A-Cath tip is positioned at the SVC/RA junction. Mediastinum/Nodes: Necrotic heterogeneously enhancing mediastinal mass centered in the AP window on the prior study has decreased substantially in size in the interval measuring 4.7 x 4.2 cm today compared to 6.6 x 6.5 cm previously. No hilar lymphadenopathy. The esophagus has normal imaging features. There is no axillary  lymphadenopathy. Dominant 3 cm right thyroid mass again noted. In the setting of significant comorbidities or limited life expectancy, no follow-up recommended (ref: J Am Coll Radiol. 2015 Feb;12(2): 143-50). Lungs/Pleura: Evolution post radiation fibrosis in the medial left lung involving upper and lower lobes. Tiny 4 mm nonspecific subpleural nodule posterior right apex (27/5) is new in the interval 3 mm subpleural right middle lobe nodule on 78/5 is stable as is the 3 mm perifissural right lower lobe nodule on 78/5 no substantial change 5 mm subpleural left lower lobe nodule on 76/5. No pleural effusion. Musculoskeletal: No worrisome lytic or sclerotic osseous abnormality. CT ABDOMEN PELVIS FINDINGS Hepatobiliary: Segment IV lesion in the liver has decreased in size in become more cystic in character since the prior study measuring 3.5 x 3.1 cm today compared to 4.4 x 4.2 cm previously 1.8 cm hypervascular lesion posterior right liver (47/2) is stable in the interval, likely benign. There is no evidence for gallstones, gallbladder wall thickening, or pericholecystic fluid. No intrahepatic or extrahepatic biliary dilation. Pancreas: No focal mass lesion. No dilatation of the main duct. No intraparenchymal cyst. No peripancreatic edema. Spleen: No splenomegaly. No focal mass lesion. Adrenals/Urinary Tract: No adrenal nodule or mass. Kidneys unremarkable. No evidence for hydroureter. The urinary bladder appears normal for the degree of distention. Stomach/Bowel: Stomach is unremarkable. No gastric wall thickening. No evidence of outlet obstruction. Duodenum is normally positioned as is the ligament of Treitz. No small bowel wall thickening. No small bowel dilatation. The terminal ileum is normal. No gross colonic mass. No colonic wall thickening. Vascular/Lymphatic: There is mild atherosclerotic calcification of the abdominal aorta without aneurysm. Small retroperitoneal lymph nodes evident. No pelvic sidewall  lymphadenopathy. Similar borderline groin lymphadenopathy bilaterally. Reproductive: Uterus surgically absent.  There is no adnexal mass. Other: No intraperitoneal free fluid. Musculoskeletal: There is a new lucent lesion with sclerotic rim identified in the L2 vertebral body (axial 64/2 and sagittal 132/7.  IMPRESSION: 1. Marked interval decrease in size of the necrotic heterogeneously enhancing mediastinal mass centered in the AP window. 2. Interval decrease in size of segment IV liver lesion now without enhancement and cystic appearance. 3. Interval development of a new lucent lesion with sclerotic rim in the L2 vertebral body. There is no radiotracer accumulation in the lumbar spine on bone scan from 07/01/2021. Given response of disease elsewhere, this may reflect healing of an L2 metastatic lesion. Close attention on follow-up recommended. 4. Tiny nonspecific subpleural nodule posterior right apex, new in the interval. Additional scattered tiny bilateral pulmonary nodules are stable. Continued attention on follow-up recommended. 5. Aortic Atherosclerosis (ICD10-I70.0). Electronically Signed   By: Misty Stanley M.D.   On: 10/01/2021 11:03   IR CV Line Injection  Result Date: 09/28/2021 INDICATION: Port malfunction EXAM: Port check and injection using fluoroscopy MEDICATIONS: None ANESTHESIA/SEDATION: None FLUOROSCOPY TIME:  Fluoroscopy Time: 0.3 minutes with 3 exposures COMPLICATIONS: None immediate. PROCEDURE: Informed written consent was obtained from the patient after a thorough discussion of the procedural risks, benefits and alternatives. All questions were addressed. Maximal Sterile Barrier Technique was utilized including caps, mask, sterile gowns, sterile gloves, sterile drape, hand hygiene and skin antiseptic. A timeout was performed prior to the initiation of the procedure. The patient was positioned supine on the exam table. The patient arrived to the IR suite with the right chest port already  accessed by the infusion center. Inspection of the port under fluoroscopy demonstrated a right chest port which appears to enter via the right internal jugular vein. The course of the catheter appears normal without kink or discontinuity. The tip of the catheter terminates near the distal SVC/superior cavoatrial junction. The port appears appropriately accessed with the needle centered in the port reservoir. The port was attempted to be aspirated, however blood return could not be achieved. The port appeared to flush appropriately. It was then injected with contrast material, studied under fluoroscopy. Again, there is clear passage of contrast material through the catheter without kink or discontinuity. Contrast material appears to exit from the catheter tip without evidence of a fibrin sheath. At the end of the procedure, the port was de accessed. The patient tolerated the procedure well without immediate complication. IMPRESSION: Evaluation and injection of the right chest port demonstrates a normal appearing port with tip terminating in the distal SVC/superior cavoatrial junction. Blood return was unable to be achieved. It does flush properly and injection of contrast material demonstrated no fibrin sheath. If would be of clinical benefit, can consider port revision with a slightly longer catheter and tip terminating deeper near the right atrium, as this may help the port function more optimally. Electronically Signed   By: Albin Felling M.D.   On: 09/28/2021 14:11     Assessment and plan- Patient is a 70 y.o. female with extensive stage small cell lung cancer with liver metastases.  She is here for on treatment assessment prior to cycle 11 of maintenance Tecentriq  I have reviewed CT chest abdomen pelvis images independently as well as bone scan and discussed results with the patient which overall shows decrease in the size of primary lung mass as well as liver metastases.  No other findings of progressive  disease.  Although there was a concern for a potential lesion in L2 this was not seen on the bone scan and we will continue to follow-up on this.  Plan is to continue Tecentriq until progression or toxicity.  She will proceed with cycle  11 of Tecentriq today and directly proceed for cycle 12 in 3 weeks and I will see her back in 6 weeks for cycle 13.  Repeat TSH with that cycle  Neoplasm related pain: Continue as needed oxycodone  Bilateral leg swelling: We will have her decrease the dose of Lasix to 20 mg once a day and continue compression stockings  Hypokalemia: Decreasing dose of Lasix as above Continue oral potassium   Visit Diagnosis 1. Small cell carcinoma of lung metastatic to liver ([place])   2. Encounter for antineoplastic immunotherapy      Dr. Randa Evens, MD, MPH [facility] [phone] 10/19/2021 10:39 AM

## 2021-11-01 DIAGNOSIS — Z03818 Encounter for observation for suspected exposure to other biological agents ruled out: Secondary | ICD-10-CM | POA: Diagnosis not present

## 2021-11-01 DIAGNOSIS — C3490 Malignant neoplasm of unspecified part of unspecified bronchus or lung: Secondary | ICD-10-CM | POA: Diagnosis not present

## 2021-11-09 ENCOUNTER — Other Ambulatory Visit: Payer: Self-pay

## 2021-11-09 ENCOUNTER — Inpatient Hospital Stay: Payer: Medicare HMO

## 2021-11-09 VITALS — BP 124/64 | HR 87 | Temp 98.0°F | Resp 18

## 2021-11-09 DIAGNOSIS — E119 Type 2 diabetes mellitus without complications: Secondary | ICD-10-CM | POA: Diagnosis not present

## 2021-11-09 DIAGNOSIS — C3490 Malignant neoplasm of unspecified part of unspecified bronchus or lung: Secondary | ICD-10-CM

## 2021-11-09 DIAGNOSIS — C787 Secondary malignant neoplasm of liver and intrahepatic bile duct: Secondary | ICD-10-CM | POA: Diagnosis not present

## 2021-11-09 DIAGNOSIS — R5383 Other fatigue: Secondary | ICD-10-CM | POA: Diagnosis not present

## 2021-11-09 DIAGNOSIS — M7989 Other specified soft tissue disorders: Secondary | ICD-10-CM | POA: Diagnosis not present

## 2021-11-09 DIAGNOSIS — R590 Localized enlarged lymph nodes: Secondary | ICD-10-CM | POA: Diagnosis not present

## 2021-11-09 DIAGNOSIS — C3412 Malignant neoplasm of upper lobe, left bronchus or lung: Secondary | ICD-10-CM | POA: Diagnosis not present

## 2021-11-09 DIAGNOSIS — R0789 Other chest pain: Secondary | ICD-10-CM | POA: Diagnosis not present

## 2021-11-09 DIAGNOSIS — Z5112 Encounter for antineoplastic immunotherapy: Secondary | ICD-10-CM | POA: Diagnosis not present

## 2021-11-09 DIAGNOSIS — I10 Essential (primary) hypertension: Secondary | ICD-10-CM | POA: Diagnosis not present

## 2021-11-09 LAB — CBC WITH DIFFERENTIAL/PLATELET
Abs Immature Granulocytes: 0.01 10*3/uL (ref 0.00–0.07)
Basophils Absolute: 0 10*3/uL (ref 0.0–0.1)
Basophils Relative: 0 %
Eosinophils Absolute: 0.1 10*3/uL (ref 0.0–0.5)
Eosinophils Relative: 3 %
HCT: 34.7 % — ABNORMAL LOW (ref 36.0–46.0)
Hemoglobin: 11 g/dL — ABNORMAL LOW (ref 12.0–15.0)
Immature Granulocytes: 0 %
Lymphocytes Relative: 25 %
Lymphs Abs: 1.1 10*3/uL (ref 0.7–4.0)
MCH: 27 pg (ref 26.0–34.0)
MCHC: 31.7 g/dL (ref 30.0–36.0)
MCV: 85.3 fL (ref 80.0–100.0)
Monocytes Absolute: 0.6 10*3/uL (ref 0.1–1.0)
Monocytes Relative: 13 %
Neutro Abs: 2.7 10*3/uL (ref 1.7–7.7)
Neutrophils Relative %: 59 %
Platelets: 258 10*3/uL (ref 150–400)
RBC: 4.07 MIL/uL (ref 3.87–5.11)
RDW: 15.4 % (ref 11.5–15.5)
WBC: 4.6 10*3/uL (ref 4.0–10.5)
nRBC: 0 % (ref 0.0–0.2)

## 2021-11-09 LAB — COMPREHENSIVE METABOLIC PANEL
ALT: 16 U/L (ref 0–44)
AST: 22 U/L (ref 15–41)
Albumin: 3.7 g/dL (ref 3.5–5.0)
Alkaline Phosphatase: 70 U/L (ref 38–126)
Anion gap: 7 (ref 5–15)
BUN: 11 mg/dL (ref 8–23)
CO2: 26 mmol/L (ref 22–32)
Calcium: 9.6 mg/dL (ref 8.9–10.3)
Chloride: 103 mmol/L (ref 98–111)
Creatinine, Ser: 0.61 mg/dL (ref 0.44–1.00)
GFR, Estimated: >60 mL/min (ref >60)
Glucose, Bld: 86 mg/dL (ref 70–99)
Potassium: 3.4 mmol/L — ABNORMAL LOW (ref 3.5–5.1)
Sodium: 136 mmol/L (ref 135–145)
Total Bilirubin: 0.6 mg/dL (ref 0.3–1.2)
Total Protein: 7.1 g/dL (ref 6.5–8.1)

## 2021-11-09 MED ORDER — HEPARIN SOD (PORK) LOCK FLUSH 100 UNIT/ML IV SOLN
INTRAVENOUS | Status: AC
  Administered 2021-11-09: 15:00:00 500 [IU]
  Filled 2021-11-09: qty 5

## 2021-11-09 MED ORDER — HEPARIN SOD (PORK) LOCK FLUSH 100 UNIT/ML IV SOLN
500.0000 [IU] | Freq: Once | INTRAVENOUS | Status: AC | PRN
  Filled 2021-11-09: qty 5

## 2021-11-09 MED ORDER — SODIUM CHLORIDE 0.9 % IV SOLN
1200.0000 mg | Freq: Once | INTRAVENOUS | Status: AC
  Administered 2021-11-09: 14:00:00 1200 mg via INTRAVENOUS
  Filled 2021-11-09: qty 20

## 2021-11-09 MED ORDER — SODIUM CHLORIDE 0.9 % IV SOLN
Freq: Once | INTRAVENOUS | Status: AC
Start: 2021-11-09 — End: 2021-11-09
  Filled 2021-11-09: qty 250

## 2021-11-09 NOTE — Progress Notes (Signed)
Nutrition Follow-up:  Patient with small cell lung cancer with metastatic disease.  Patient receiving tecentriq.  Met with patient during infusion.  Patient reports that bilateral leg edema is better, just mainly in ankles and feet vs all the up her legs.  Lasix dosing has been reduced.  Patient reports that appetite is better.  She is eating cereal for breakfast and meat and vegetables for lunch and dinner.  Drinking ensure original 2-3 daily.  Concerned about her weight going up.      Medications: reviewed  Labs: reviewed  Anthropometrics:   Weight 161 lb 6.4 oz today  159 lb on 11/15 157 lb on 10/25 123 lb on 8/2 118 lb on 7/12 121 lb on 6/21 124 lb on 3/22   NUTRITION DIAGNOSIS: Inadequate oral intake improved.   INTERVENTION:  Discussed reducing oral nutrition supplements with good solid food intake and weight gain.  Patient agreeable Encouraged patient to continue foods high in protein.    MONITORING, EVALUATION, GOAL: weight (dry), intake   NEXT VISIT: as needed  Jehieli Brassell B. Zenia Resides, [place], [place] Registered Dietitian [phone] (mobile)

## 2021-11-16 DIAGNOSIS — I10 Essential (primary) hypertension: Secondary | ICD-10-CM | POA: Diagnosis not present

## 2021-11-16 DIAGNOSIS — C3490 Malignant neoplasm of unspecified part of unspecified bronchus or lung: Secondary | ICD-10-CM | POA: Diagnosis not present

## 2021-11-16 DIAGNOSIS — E119 Type 2 diabetes mellitus without complications: Secondary | ICD-10-CM | POA: Diagnosis not present

## 2021-11-16 DIAGNOSIS — R600 Localized edema: Secondary | ICD-10-CM | POA: Diagnosis not present

## 2021-11-16 DIAGNOSIS — C787 Secondary malignant neoplasm of liver and intrahepatic bile duct: Secondary | ICD-10-CM | POA: Diagnosis not present

## 2021-11-16 DIAGNOSIS — R635 Abnormal weight gain: Secondary | ICD-10-CM | POA: Diagnosis not present

## 2021-11-24 ENCOUNTER — Other Ambulatory Visit: Payer: Self-pay | Admitting: *Deleted

## 2021-11-24 DIAGNOSIS — C3490 Malignant neoplasm of unspecified part of unspecified bronchus or lung: Secondary | ICD-10-CM

## 2021-11-30 ENCOUNTER — Inpatient Hospital Stay: Payer: Medicare HMO

## 2021-11-30 ENCOUNTER — Other Ambulatory Visit: Payer: Self-pay

## 2021-11-30 ENCOUNTER — Inpatient Hospital Stay: Payer: Medicare HMO | Attending: Oncology | Admitting: Oncology

## 2021-11-30 ENCOUNTER — Encounter: Payer: Self-pay | Admitting: Oncology

## 2021-11-30 VITALS — BP 128/75 | HR 55 | Temp 97.2°F | Wt 160.5 lb

## 2021-11-30 DIAGNOSIS — Z8349 Family history of other endocrine, nutritional and metabolic diseases: Secondary | ICD-10-CM | POA: Diagnosis not present

## 2021-11-30 DIAGNOSIS — C3412 Malignant neoplasm of upper lobe, left bronchus or lung: Secondary | ICD-10-CM | POA: Insufficient documentation

## 2021-11-30 DIAGNOSIS — R0789 Other chest pain: Secondary | ICD-10-CM | POA: Diagnosis not present

## 2021-11-30 DIAGNOSIS — Z803 Family history of malignant neoplasm of breast: Secondary | ICD-10-CM | POA: Insufficient documentation

## 2021-11-30 DIAGNOSIS — R5383 Other fatigue: Secondary | ICD-10-CM | POA: Insufficient documentation

## 2021-11-30 DIAGNOSIS — Z5982 Transportation insecurity: Secondary | ICD-10-CM | POA: Insufficient documentation

## 2021-11-30 DIAGNOSIS — Z8249 Family history of ischemic heart disease and other diseases of the circulatory system: Secondary | ICD-10-CM | POA: Insufficient documentation

## 2021-11-30 DIAGNOSIS — E049 Nontoxic goiter, unspecified: Secondary | ICD-10-CM | POA: Diagnosis not present

## 2021-11-30 DIAGNOSIS — Z5112 Encounter for antineoplastic immunotherapy: Secondary | ICD-10-CM | POA: Insufficient documentation

## 2021-11-30 DIAGNOSIS — C787 Secondary malignant neoplasm of liver and intrahepatic bile duct: Secondary | ICD-10-CM | POA: Diagnosis not present

## 2021-11-30 DIAGNOSIS — C3490 Malignant neoplasm of unspecified part of unspecified bronchus or lung: Secondary | ICD-10-CM | POA: Diagnosis not present

## 2021-11-30 DIAGNOSIS — Z79899 Other long term (current) drug therapy: Secondary | ICD-10-CM | POA: Insufficient documentation

## 2021-11-30 DIAGNOSIS — M7989 Other specified soft tissue disorders: Secondary | ICD-10-CM | POA: Diagnosis not present

## 2021-11-30 DIAGNOSIS — Z87891 Personal history of nicotine dependence: Secondary | ICD-10-CM | POA: Diagnosis not present

## 2021-11-30 DIAGNOSIS — R600 Localized edema: Secondary | ICD-10-CM | POA: Diagnosis not present

## 2021-11-30 DIAGNOSIS — Z5941 Food insecurity: Secondary | ICD-10-CM | POA: Insufficient documentation

## 2021-11-30 LAB — COMPREHENSIVE METABOLIC PANEL
ALT: 13 U/L (ref 0–44)
AST: 19 U/L (ref 15–41)
Albumin: 3.9 g/dL (ref 3.5–5.0)
Alkaline Phosphatase: 75 U/L (ref 38–126)
Anion gap: 8 (ref 5–15)
BUN: 15 mg/dL (ref 8–23)
CO2: 24 mmol/L (ref 22–32)
Calcium: 9.7 mg/dL (ref 8.9–10.3)
Chloride: 105 mmol/L (ref 98–111)
Creatinine, Ser: 0.61 mg/dL (ref 0.44–1.00)
GFR, Estimated: >60 mL/min (ref >60)
Glucose, Bld: 98 mg/dL (ref 70–99)
Potassium: 3.6 mmol/L (ref 3.5–5.1)
Sodium: 137 mmol/L (ref 135–145)
Total Bilirubin: 0.7 mg/dL (ref 0.3–1.2)
Total Protein: 7.1 g/dL (ref 6.5–8.1)

## 2021-11-30 LAB — CBC WITH DIFFERENTIAL/PLATELET
Abs Immature Granulocytes: 0 10*3/uL (ref 0.00–0.07)
Basophils Absolute: 0 10*3/uL (ref 0.0–0.1)
Basophils Relative: 0 %
Eosinophils Absolute: 0.2 10*3/uL (ref 0.0–0.5)
Eosinophils Relative: 5 %
HCT: 35.5 % — ABNORMAL LOW (ref 36.0–46.0)
Hemoglobin: 11.3 g/dL — ABNORMAL LOW (ref 12.0–15.0)
Immature Granulocytes: 0 %
Lymphocytes Relative: 27 %
Lymphs Abs: 1 10*3/uL (ref 0.7–4.0)
MCH: 27.4 pg (ref 26.0–34.0)
MCHC: 31.8 g/dL (ref 30.0–36.0)
MCV: 86.2 fL (ref 80.0–100.0)
Monocytes Absolute: 0.5 10*3/uL (ref 0.1–1.0)
Monocytes Relative: 14 %
Neutro Abs: 2 10*3/uL (ref 1.7–7.7)
Neutrophils Relative %: 54 %
Platelets: 222 10*3/uL (ref 150–400)
RBC: 4.12 MIL/uL (ref 3.87–5.11)
RDW: 15.9 % — ABNORMAL HIGH (ref 11.5–15.5)
WBC: 3.7 10*3/uL — ABNORMAL LOW (ref 4.0–10.5)
nRBC: 0 % (ref 0.0–0.2)

## 2021-11-30 LAB — TSH: TSH: 2.188 u[IU]/mL (ref 0.350–4.500)

## 2021-11-30 MED ORDER — SODIUM CHLORIDE 0.9 % IV SOLN
1200.0000 mg | Freq: Once | INTRAVENOUS | Status: AC
  Administered 2021-11-30: 1200 mg via INTRAVENOUS
  Filled 2021-11-30: qty 20

## 2021-11-30 MED ORDER — SODIUM CHLORIDE 0.9% FLUSH
10.0000 mL | Freq: Once | INTRAVENOUS | Status: AC
  Administered 2021-11-30: 10 mL via INTRAVENOUS
  Filled 2021-11-30: qty 10

## 2021-11-30 MED ORDER — SODIUM CHLORIDE 0.9 % IV SOLN
Freq: Once | INTRAVENOUS | Status: AC
  Filled 2021-11-30: qty 250

## 2021-11-30 MED ORDER — HEPARIN SOD (PORK) LOCK FLUSH 100 UNIT/ML IV SOLN
500.0000 [IU] | Freq: Once | INTRAVENOUS | Status: AC | PRN
  Administered 2021-11-30: 500 [IU]
  Filled 2021-11-30: qty 5

## 2021-11-30 NOTE — Patient Instructions (Signed)
[facility] CANCER CTR AT [place]  Discharge Instructions: Thank you for choosing [facility] to provide your oncology and hematology care.  If you have a lab appointment with the [facility], please go directly to the [facility] and check in at the registration area.  Wear comfortable clothing and clothing appropriate for easy access to any Portacath or PICC line.   We strive to give you quality time with your provider. You may need to reschedule your appointment if you arrive late (15 or more minutes).  Arriving late affects you and other patients whose appointments are after yours.  Also, if you miss three or more appointments without notifying the office, you may be dismissed from the clinic at the providers discretion.      For prescription refill requests, have your pharmacy contact our office and allow 72 hours for refills to be completed.    Today you received the following chemotherapy and/or immunotherapy agents TECENTRIQ      To help prevent nausea and vomiting after your treatment, we encourage you to take your nausea medication as directed.  BELOW ARE SYMPTOMS THAT SHOULD BE REPORTED IMMEDIATELY: *FEVER GREATER THAN 100.4 F (38 C) OR HIGHER *CHILLS OR SWEATING *NAUSEA AND VOMITING THAT IS NOT CONTROLLED WITH YOUR NAUSEA MEDICATION *UNUSUAL SHORTNESS OF BREATH *UNUSUAL BRUISING OR BLEEDING *URINARY PROBLEMS (pain or burning when urinating, or frequent urination) *BOWEL PROBLEMS (unusual diarrhea, constipation, pain near the anus) TENDERNESS IN MOUTH AND THROAT WITH OR WITHOUT PRESENCE OF ULCERS (sore throat, sores in mouth, or a toothache) UNUSUAL RASH, SWELLING OR PAIN  UNUSUAL VAGINAL DISCHARGE OR ITCHING   Items with * indicate a potential emergency and should be followed up as soon as possible or go to the Emergency Department if any problems should occur.  Please show the CHEMOTHERAPY ALERT CARD or IMMUNOTHERAPY ALERT CARD at check-in to  the Emergency Department and triage nurse.  Should you have questions after your visit or need to cancel or reschedule your appointment, please contact [facility] CANCER Ward AT [place]  [phone] and follow the prompts.  Office hours are 8:00 a.m. to 4:30 p.m. Monday - Friday. Please note that voicemails left after 4:00 p.m. may not be returned until the following business day.  We are closed weekends and major holidays. You have access to a nurse at all times for urgent questions. Please call the main number to the clinic [phone] and follow the prompts.  For any non-urgent questions, you may also contact your provider using MyChart. We now offer e-Visits for anyone 83 and older to request care online for non-urgent symptoms. For details visit [email or web address].   Also download the MyChart app! Go to the app store, search "MyChart", open the app, select [facility], and log in with your MyChart username and password.  Due to Covid, a mask is required upon entering the hospital/clinic. If you do not have a mask, one will be given to you upon arrival. For doctor visits, patients may have 1 support person aged 67 or older with them. For treatment visits, patients cannot have anyone with them due to current Covid guidelines and our immunocompromised  population.   Atezolizumab injection What is this medication? ATEZOLIZUMAB (a te zoe LIZ ue mab) is a monoclonal antibody. It is used to treat bladder cancer (urothelial cancer), liver cancer, lung cancer, and melanoma. This medicine may be used for other purposes; ask your health care provider or pharmacist if you have questions. COMMON BRAND NAME(S):  Tecentriq What should I tell my care team before I take this medication? They need to know if you have any of these conditions: autoimmune diseases like Crohn's disease, ulcerative colitis, or lupus have had or planning to have an allogeneic stem cell transplant (uses someone  else's stem cells) history of organ transplant history of radiation to the chest nervous system problems like myasthenia gravis or Guillain-Barre syndrome an unusual or allergic reaction to atezolizumab, other medicines, foods, dyes, or preservatives pregnant or trying to get pregnant breast-feeding How should I use this medication? This medicine is for infusion into a vein. It is given by a health care professional in a hospital or clinic setting. A special MedGuide will be given to you before each treatment. Be sure to read this information carefully each time. Talk to your pediatrician regarding the use of this medicine in children. Special care may be needed. Overdosage: If you think you have taken too much of this medicine contact a poison control center or emergency room at once. NOTE: This medicine is only for you. Do not share this medicine with others. What if I miss a dose? It is important not to miss your dose. Call your doctor or health care professional if you are unable to keep an appointment. What may interact with this medication? Interactions have not been studied. This list may not describe all possible interactions. Give your health care provider a list of all the medicines, herbs, non-prescription drugs, or dietary supplements you use. Also tell them if you smoke, drink alcohol, or use illegal drugs. Some items may interact with your medicine. What should I watch for while using this medication? Your condition will be monitored carefully while you are receiving this medicine. You may need blood work done while you are taking this medicine. Do not become pregnant while taking this medicine or for at least 5 months after stopping it. Women should inform their doctor if they wish to become pregnant or think they might be pregnant. There is a potential for serious side effects to an unborn child. Talk to your health care professional or pharmacist for more information. Do not  breast-feed an infant while taking this medicine or for at least 5 months after the last dose. What side effects may I notice from receiving this medication? Side effects that you should report to your doctor or health care professional as soon as possible: allergic reactions like skin rash, itching or hives, swelling of the face, lips, or tongue black, tarry stools bloody or watery diarrhea breathing problems changes in vision chest pain or chest tightness chills facial flushing fever headache signs and symptoms of high blood sugar such as dizziness; dry mouth; dry skin; fruity breath; nausea; stomach pain; increased hunger or thirst; increased urination signs and symptoms of liver injury like dark yellow or brown urine; general ill feeling or flu-like symptoms; light-colored stools; loss of appetite; nausea; right upper belly pain; unusually weak or tired; yellowing of the eyes or skin stomach pain trouble passing urine or change in the amount of urine Side effects that usually do not require medical attention (report to your doctor or health care professional if they continue or are bothersome): bone pain cough diarrhea joint pain muscle pain muscle weakness swelling of arms or legs tiredness weight loss This list may not describe all possible side effects. Call your doctor for medical advice about side effects. You may report side effects to FDA at 1-800-FDA-1088. Where should I keep my medication? This  drug is given in a hospital or clinic and will not be stored at home. NOTE: This sheet is a summary. It may not cover all possible information. If you have questions about this medicine, talk to your doctor, pharmacist, or health care provider.  2022 Elsevier/Gold Standard (2021-07-20 00:00:00)

## 2021-11-30 NOTE — Progress Notes (Signed)
Hematology/Oncology Consult note [facility]  [phone] [phone]  Patient Care Team: Leonel Ramsay, MD as PCP - General (Infectious Diseases) Telford Nab, RN as Oncology Nurse Navigator Sindy Guadeloupe, MD as Consulting Physician (Hematology and Oncology)   Name of the patient: Doris Johnson  [id number]  12-12-1950   Date of visit: 11/30/21  Diagnosis- extensive stage small cell lung cancer with liver metastases  Chief complaint/ Reason for visit-on treatment assessment prior to cycle 12 of maintenance Tecentriq  Heme/Onc history: Patient is a 71 year old female with a remote history of smoking in her teenage years and exposure to passive smoking.  She has been having ongoing midsternal pain which has been growing since the last 6 to 7 months.  She had been to urgent care as well in the past.  She finally had a CT chest with contrast done in October 2021 which showed a large mass in the left side of the mediastinum measuring 6.7 x 6.2 x 6.1 cm causing severe compression of the left main pulmonary artery and around the aortic arch in the left subclavian artery.  Enlarged thyroid gland.  Left upper lobe nodule 1 x 0.7 cm.  She then underwent bronchoscopy with Dr.Aleskerov left upper lobe biopsy was nondiagnostic.  However station 4, 7 and 10 L lymph nodes were consistent with metastatic small cell carcinoma.   PET CT scan showed enlarged level 3 cervical lymph node 2.2 cm that was hypermetabolic with an SUV of 9.7.  Large central 7.3 cm AP window mass.  5.7 cm left liver mass.  MRI brain with and without contrast showed 2 small foci of enhancement in the right cerebellar hemisphere and right temporal lobe without mass-effect or edema as well concerning for brain metastases   Patient had 2 cycles of carbo etoposide chemotherapy along with Tecentriq and subsequent scan showed no significant improvement in the primary lung mass or liver  mass.  She received radiation treatment to her primary lung mass and also seen by [facility] for second opinion.  Her pathology was reviewed at [facility] and reported as possible neuroendocrine neoplasm But stated that small cell carcinoma cannot be excluded but not definitely diagnostic for it.  However [facility] pathology feels that this is consistent with small cell lung cancer.  She has completed 4 cycles of carbo etoposide Tecentriq chemotherapy and is currently on maintenance Tecentriq.  Repeat liver biopsy was also performed and was consistent with small cell lung cancer    Interval history-patient reports doing well overall.  Leg swelling is improved.  Appetite and weight have remained stable.  Infectious trying to lose weight.  Chest wall pain is well controlled with as needed oxycodone  ECOG PS- 1 Pain scale- 0 Opioid associated constipation- no  Review of systems- Review of Systems  Constitutional:  Positive for malaise/fatigue. Negative for chills, fever and weight loss.  HENT:  Negative for congestion, ear discharge and nosebleeds.   Eyes:  Negative for blurred vision.  Respiratory:  Negative for cough, hemoptysis, sputum production, shortness of breath and wheezing.   Cardiovascular:  Negative for chest pain, palpitations, orthopnea and claudication.  Gastrointestinal:  Negative for abdominal pain, blood in stool, constipation, diarrhea, heartburn, melena, nausea and vomiting.  Genitourinary:  Negative for dysuria, flank pain, frequency, hematuria and urgency.  Musculoskeletal:  Negative for back pain, joint pain and myalgias.  Skin:  Negative for rash.  Neurological:  Negative for dizziness, tingling, focal weakness, seizures, weakness and headaches.  Endo/Heme/Allergies:  Does not bruise/bleed easily.  Psychiatric/Behavioral:  Negative for depression and suicidal ideas. The patient does not have insomnia.      No Known Allergies   Past Medical History:  Diagnosis Date   Cancer ([place])     Cataract    Diabetes mellitus without complication ([place])    Hyperlipidemia    Hypertension    Hypothyroidism    doctor's keeping an eye on thyroid    Lung cancer [facility])      Past Surgical History:  Procedure Laterality Date   ABDOMINAL HYSTERECTOMY     IR CV LINE INJECTION  09/28/2021   PORTA CATH INSERTION N/A 11/30/2020   Procedure: PORTA CATH INSERTION;  Surgeon: Algernon Huxley, MD;  Location: [facility] CV LAB;  Service: Cardiovascular;  Laterality: N/A;   VIDEO BRONCHOSCOPY WITH ENDOBRONCHIAL NAVIGATION N/A 10/21/2020   Procedure: VIDEO BRONCHOSCOPY WITH ENDOBRONCHIAL NAVIGATION;  Surgeon: Ottie Glazier, MD;  Location: [facility] ORS;  Service: Thoracic;  Laterality: N/A;   VIDEO BRONCHOSCOPY WITH ENDOBRONCHIAL ULTRASOUND N/A 10/21/2020   Procedure: VIDEO BRONCHOSCOPY WITH ENDOBRONCHIAL ULTRASOUND;  Surgeon: Ottie Glazier, MD;  Location: [facility] ORS;  Service: Thoracic;  Laterality: N/A;    Social History   Socioeconomic History   Marital status: Single    Spouse name: Not on file   Number of children: Not on file   Years of education: Not on file   Highest education level: Not on file  Occupational History   Not on file  Tobacco Use   Smoking status: Never   Smokeless tobacco: Never  Vaping Use   Vaping Use: Never used  Substance and Sexual Activity   Alcohol use: No   Drug use: No   Sexual activity: Not Currently  Other Topics Concern   Not on file  Social History Narrative   Not on file   Social Determinants of Health   Financial Resource Strain: Not on file  Food Insecurity: Food Insecurity Present   Worried About Running Out of Food in the Last Year: Sometimes true   Arboriculturist in the Last Year: Sometimes true  Transportation Needs: Unmet Transportation Needs   Lack of Transportation (Medical): Yes   Lack of Transportation (Non-Medical): Yes  Physical Activity: Not on file  Stress: Not on file  Social Connections: Not on file  Intimate Partner  Violence: Not on file    Family History  Problem Relation Age of Onset   Cancer Mother        breast   Hypertension Mother    Breast cancer Mother 79   Heart disease Father    Hyperlipidemia Brother    Heart disease Brother      Current Outpatient Medications:    fluticasone (FLONASE) 50 MCG/ACT nasal spray, Place 2 sprays into both nostrils daily., Disp: 16 g, Rfl: 5   lidocaine-prilocaine (EMLA) cream, Apply 1 application topically as needed. Apply small amount to port site at least 1 hour prior to it being accessed, cover with plastic wrap, Disp: 30 g, Rfl: 1   mirtazapine (REMERON) 15 MG tablet, Take 15 mg by mouth at bedtime., Disp: , Rfl:    oxycodone (ROXICODONE) 30 MG immediate release tablet, Take 1 tablet (30 mg total) by mouth every 4 (four) hours as needed for pain., Disp: 180 tablet, Rfl: 0   polyethylene glycol (MIRALAX / GLYCOLAX) 17 g packet, Take 17 g by mouth daily., Disp: , Rfl:    potassium chloride SA (KLOR-CON M) 20 MEQ tablet,  Take 2 tablets (40 mEq total) by mouth daily., Disp: 7 tablet, Rfl: 0   furosemide (LASIX) 20 MG tablet, Take 1 tablet (20 mg total) by mouth daily. (Patient not taking: Reported on 11/30/2021), Disp: 60 tablet, Rfl: 0   LORazepam (ATIVAN) 0.5 MG tablet, Take 1 tablet (0.5 mg total) by mouth every 6 (six) hours as needed (Nausea or vomiting). (Patient not taking: Reported on 09/28/2021), Disp: 30 tablet, Rfl: 0   naloxone (NARCAN) nasal spray 4 mg/0.1 mL, , Disp: , Rfl:    ondansetron (ZOFRAN) 8 MG tablet, Take 1 tablet (8 mg total) by mouth 2 (two) times daily as needed for refractory nausea / vomiting. Start on day 3 after carboplatin chemo. (Patient not taking: Reported on 10/19/2021), Disp: 30 tablet, Rfl: 1   prochlorperazine (COMPAZINE) 10 MG tablet, Take 1 tablet (10 mg total) by mouth every 6 (six) hours as needed (Nausea or vomiting). (Patient not taking: Reported on 10/19/2021), Disp: 30 tablet, Rfl: 1 No current facility-administered  medications for this visit.  Facility-Administered Medications Ordered in Other Visits:    sodium chloride flush (NS) 0.9 % injection 10 mL, 10 mL, Intravenous, PRN, Sindy Guadeloupe, MD, 10 mL at 12/15/20 0850  Physical exam:  Vitals:   11/30/21 0947  BP: 128/75  Pulse: (!) 55  Temp: (!) 97.2 F (36.2 C)  TempSrc: Tympanic  Weight: 160 lb 8 oz (72.8 kg)   Physical Exam Constitutional:      General: She is not in acute distress. Cardiovascular:     Rate and Rhythm: Normal rate and regular rhythm.     Heart sounds: Normal heart sounds.  Pulmonary:     Effort: Pulmonary effort is normal.     Breath sounds: Normal breath sounds.  Abdominal:     General: Bowel sounds are normal.     Palpations: Abdomen is soft.  Musculoskeletal:     Comments: Trace bilateral lower extremity edema.  Compression stockings in place  Skin:    General: Skin is warm and dry.  Neurological:     Mental Status: She is alert and oriented to person, place, and time.     CMP Latest Ref Rng & Units 11/30/2021  Glucose 70 - 99 mg/dL 98  BUN 8 - 23 mg/dL 15  Creatinine 0.44 - 1.00 mg/dL 0.61  Sodium 135 - 145 mmol/L 137  Potassium 3.5 - 5.1 mmol/L 3.6  Chloride 98 - 111 mmol/L 105  CO2 22 - 32 mmol/L 24  Calcium 8.9 - 10.3 mg/dL 9.7  Total Protein 6.5 - 8.1 g/dL 7.1  Total Bilirubin 0.3 - 1.2 mg/dL 0.7  Alkaline Phos 38 - 126 U/L 75  AST 15 - 41 U/L 19  ALT 0 - 44 U/L 13   CBC Latest Ref Rng & Units 11/30/2021  WBC 4.0 - 10.5 K/uL 3.7(L)  Hemoglobin 12.0 - 15.0 g/dL 11.3(L)  Hematocrit 36.0 - 46.0 % 35.5(L)  Platelets 150 - 400 K/uL 222     Assessment and plan- Patient is a 71 y.o. female with extensive stage small cell lung cancer with liver metastases.  She is here for on treatment assessment prior to cycle 12 of maintenance Tecentriq  Counts okay to proceed with cycle 12 of maintenance Tecentriq today.  I will see her back in 3 weeks for cycle 13.TSH continues to be normal.  Overall she is  tolerating treatment well without any significant side effects.  Bilateral lower extremity edema has decreased in size.  I will  plan to repeat scans in about 4 to 5 weeks time.   Visit Diagnosis 1. Small cell carcinoma of lung metastatic to liver ([place])   2. Encounter for antineoplastic immunotherapy      Dr. Randa Evens, MD, MPH [facility] at [facility] [phone] 11/30/2021 1:50 PM

## 2021-11-30 NOTE — Progress Notes (Signed)
Pt is out of L[place] and wants to know if she is supposed to refill it and take more. I told pt Dr.Fitzgerld prescribed that medication and she needed to call his office.

## 2021-12-02 ENCOUNTER — Encounter: Payer: Self-pay | Admitting: Licensed Clinical Social Worker

## 2021-12-02 ENCOUNTER — Telehealth: Payer: Self-pay | Admitting: Licensed Clinical Social Worker

## 2021-12-02 ENCOUNTER — Encounter: Payer: Self-pay | Admitting: *Deleted

## 2021-12-02 DIAGNOSIS — C3490 Malignant neoplasm of unspecified part of unspecified bronchus or lung: Secondary | ICD-10-CM

## 2021-12-02 NOTE — Progress Notes (Signed)
[facility] Work  Initial Assessment   KYOMI HECTOR is a 71 y.o. year old female contacted by phone. Clinical Social Work was referred by [facility] RN for assessment of psychosocial needs.   SDOH (Social Determinants of Health) assessments performed: No   Distress Screen completed: No ONCBCN DISTRESS SCREENING 10/29/2020  Information Concerns Type Lack of info about diagnosis      Family/Social Information:  Housing Arrangement: patient lives with daughter Elmer Ramp   and two granddaughters Family members/support persons in your life? Family Transportation concerns: yes, patient's family is currently providing transportation but patient expressed concern over the possibility the family may not be able to provide transportation Employment: Retired. Income source: Paediatric nurse concerns: Yes, due to illness and/or loss of work during treatment Patient stared she is unable to work due to fatigue and os concerned about covering utilities Type of concern: Utilities and Rent/ mortgage Food access concerns: no Religious or spiritual practice: N/A Medication Concerns: no, patient has Medicare and Medicaid Services Currently in place:  N/A  Coping/ Adjustment to diagnosis: Patient understands treatment plan and what happens next? yes Concerns about diagnosis and/or treatment: Losing my job and How I will pay for the services I need Patient reported stressors: Housing, Publishing rights manager, and Heritage manager and priorities: N/A Patient enjoys watching TV Current coping skills/ strengths: Average or above average intelligence , Capable of independent living , and Supportive family/friends     SUMMARY: Current SDOH Barriers:  Financial constraints related to inability to work due to illness and fatigue from treatment and Transportation  Clinical Social Work Clinical Goal(s):  Patient will discuss resource options with her daughter Ms.  Nelson  Interventions: Discussed common feeling and emotions when being diagnosed with cancer, and the importance of support during treatment Informed patient of the support team roles and support services at [facility] Provided CSW contact information and encouraged patient to call with any questions or concerns Referred patient to [employer], Provided patient with information about outside resources for financial assistance, and lung cancer group   Follow Up Plan: Patient will contact CSW with any support or resource needs, CSW will follow-up with patient by phone , and CSW will bring information to patient during her next appointment , CSW will send email to p[patient's daughter with resource information Patient verbalizes understanding of plan: Yes    Adelene Amas , LCSW

## 2021-12-02 NOTE — Progress Notes (Signed)
Pt called in to see if she can discuss some concerns with a Education officer, museum and request community resources. Attempted to get more details from patient what her concerns are in order to inform the social worker but pt was reluctant on providing more details. Pt informed that will place referral to the social worker and she will be in touch. Pt verbalized understanding. Encouraged to call back with any other questions or needs.

## 2021-12-07 IMAGING — US US BIOPSY CORE LIVER
1 series · 15 of 15 positions shown · non-contrast
Comparison: none

INDICATION: 69-year-old woman with history of small cell lung cancer presents to
interventional radiology for biopsy left liver mass

[Series 1: us biopsy · 15 acquisitions, 15 frames shown]
[im 1/15]
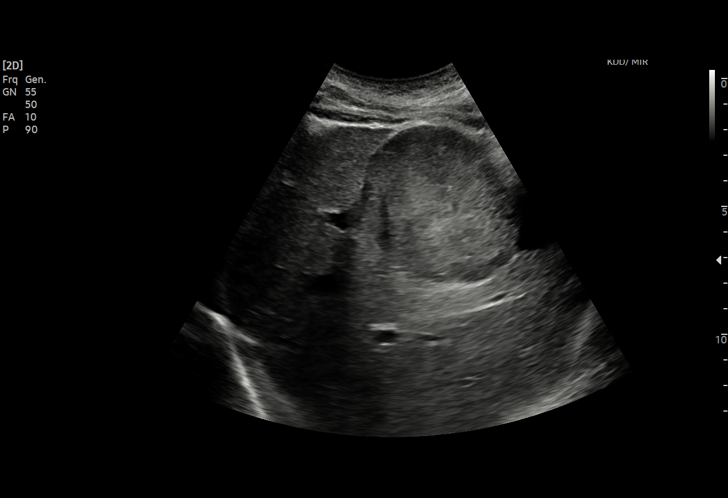
[im 2/15]
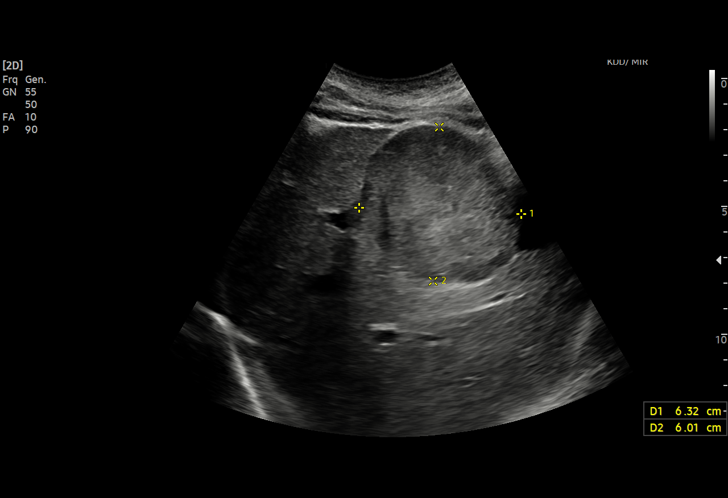
[im 3/15]
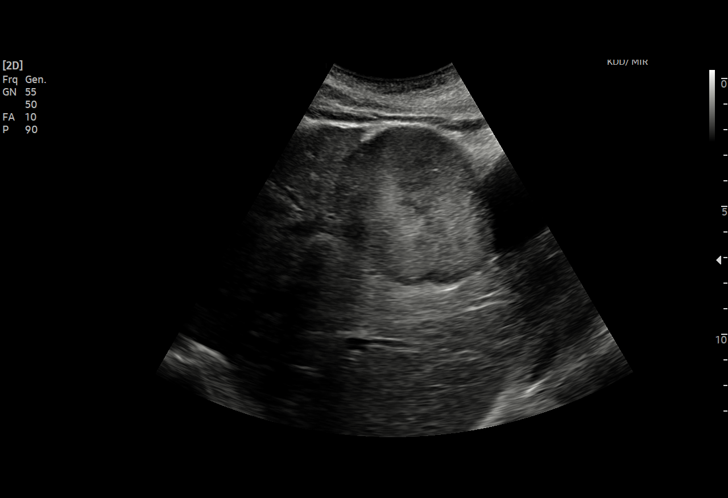
[im 4/15]
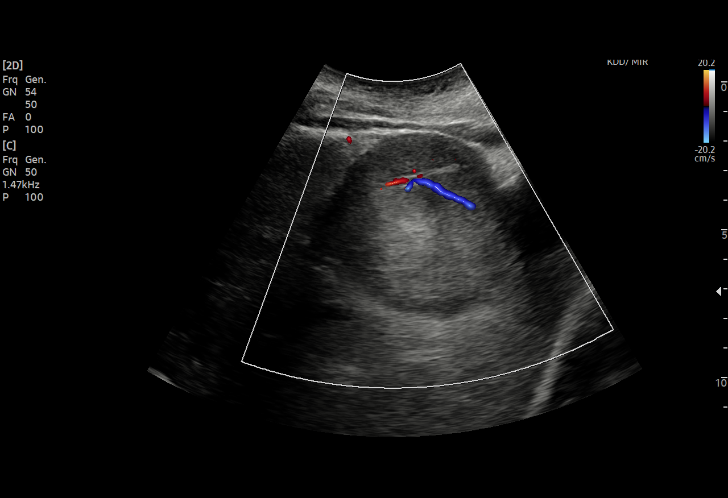
[im 5/15]
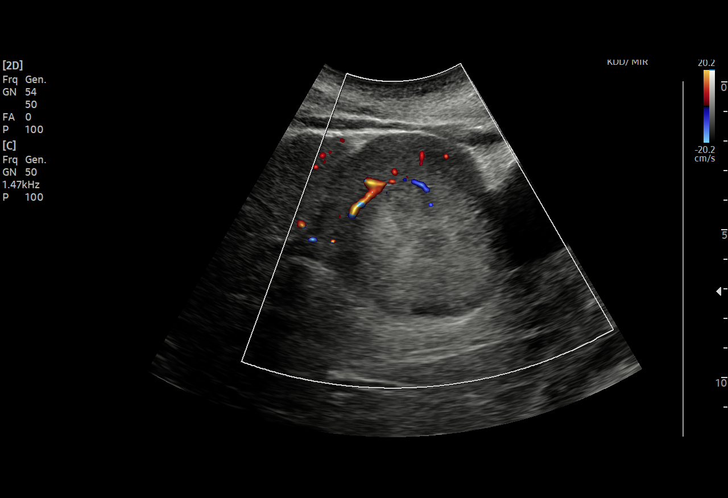
[im 6/15]
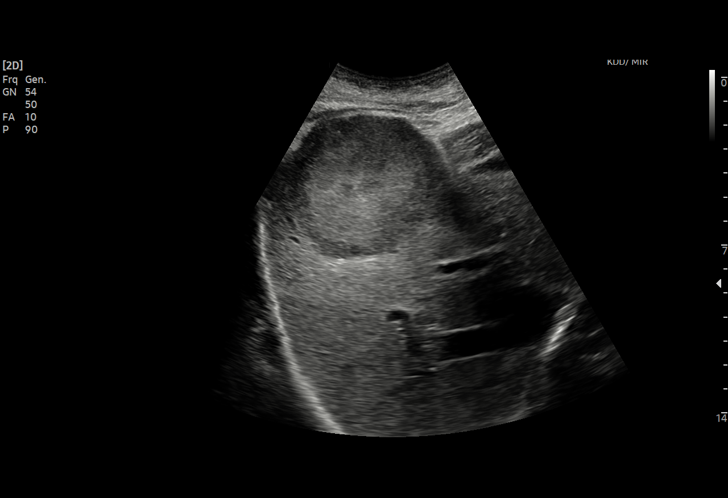
[im 7/15]
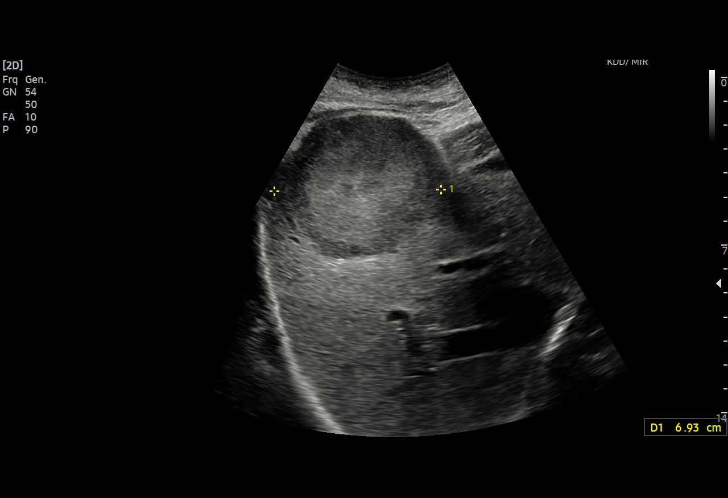
[im 8/15]
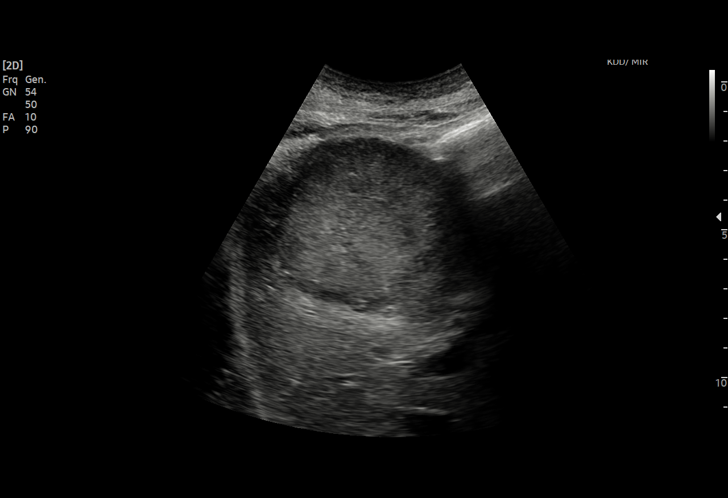
[im 9/15]
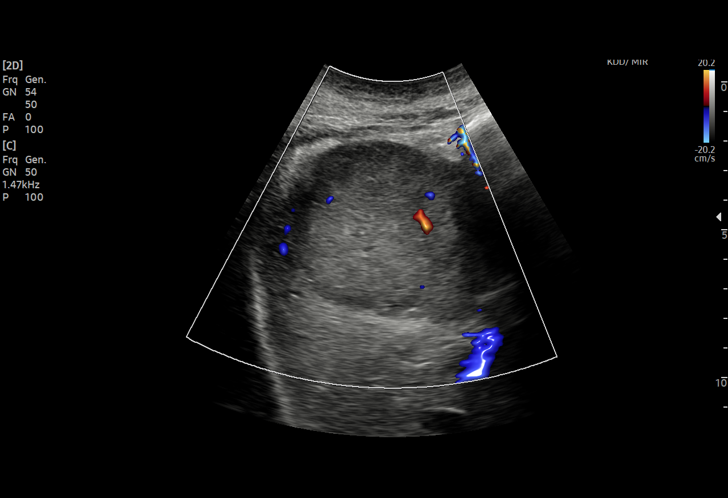
[im 10/15]
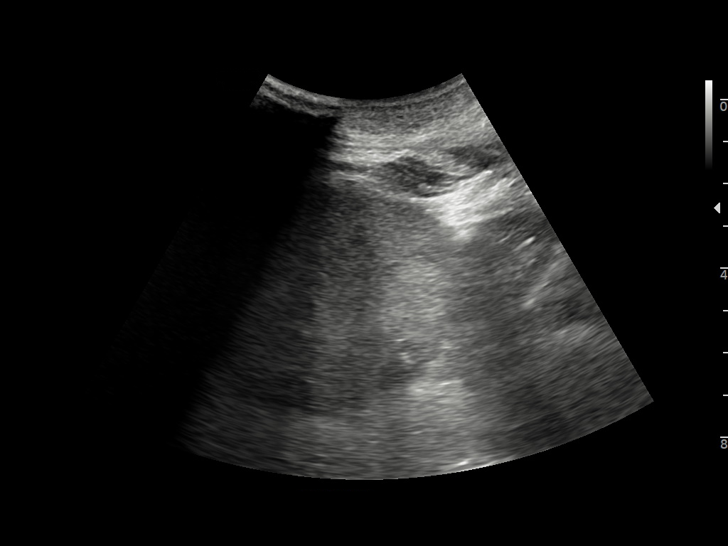
[im 11/15]
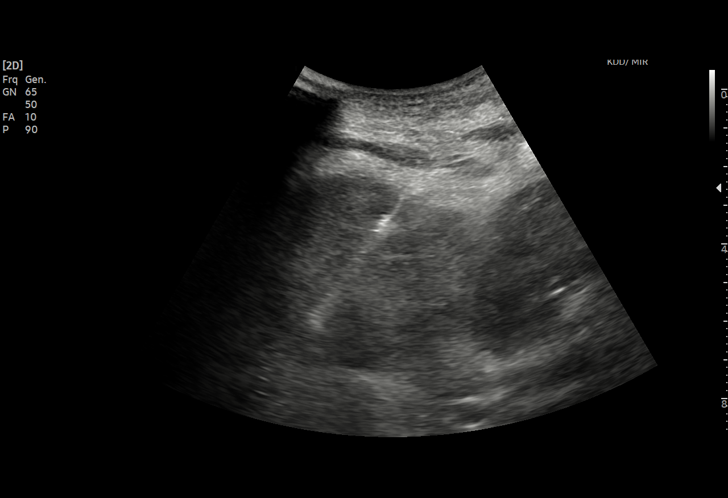
[im 12/15]
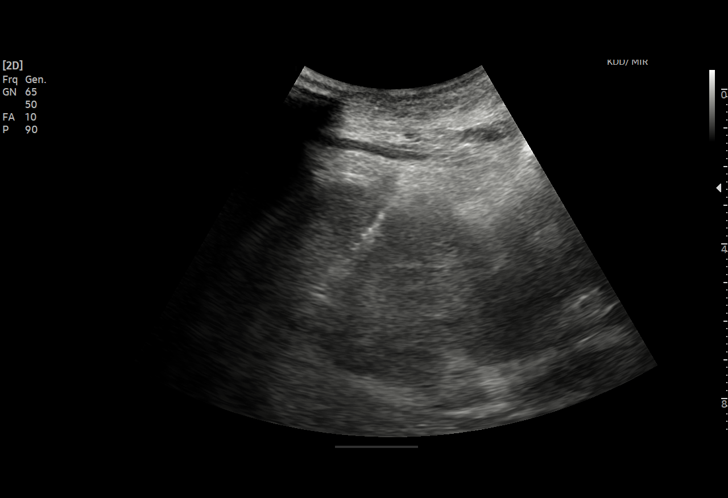
[im 13/15]
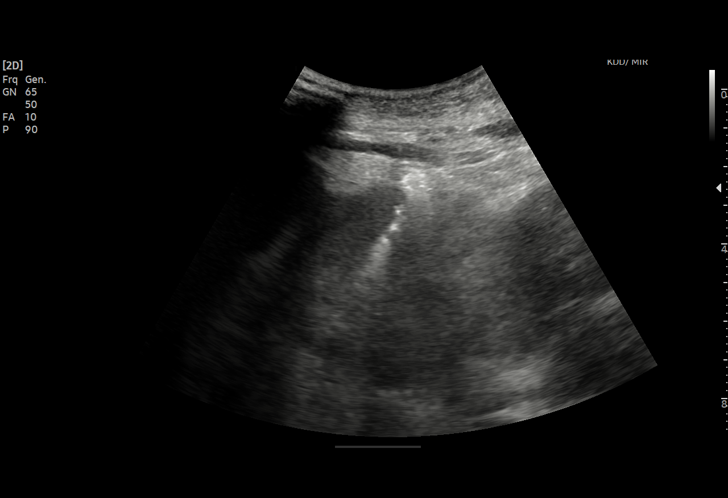
[im 14/15]
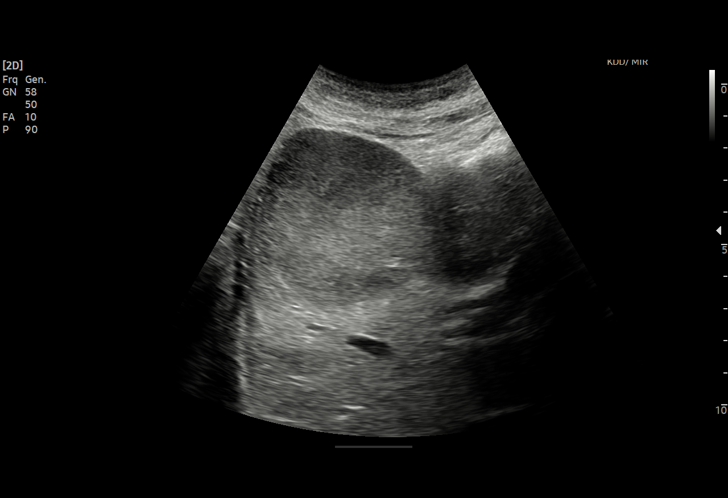
[im 15/15]
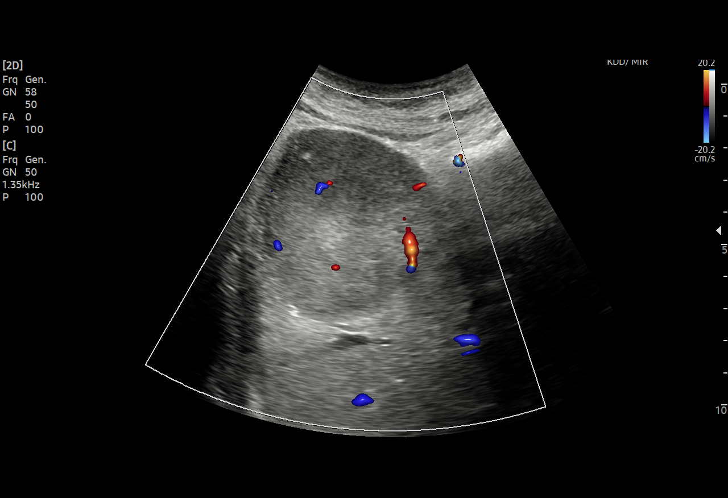

[15 of 15 positions shown; findings below may reference images not displayed]

EXAM:
Ultrasound-guided biopsy of left hepatic mass

MEDICATIONS:
None.

ANESTHESIA/SEDATION:
Moderate (conscious) sedation was employed during this procedure. A
total of Versed 1 mg and Fentanyl 25 mcg was administered
intravenously.

Moderate Sedation Time: 7 minutes. The patient's level of
consciousness and vital signs were monitored continuously by
radiology nursing throughout the procedure under my direct
supervision.

COMPLICATIONS:
None immediate.

PROCEDURE:
Informed written consent was obtained from the patient after a
thorough discussion of the procedural risks, benefits and
alternatives. All questions were addressed. Maximal Sterile Barrier
Technique was utilized including caps, mask, sterile gowns, sterile
gloves, sterile drape, hand hygiene and skin antiseptic. A timeout
was performed prior to the initiation of the procedure.

Patient position supine on the ultrasound table.

Right upper quadrant skin prepped and draped in usual sterile
fashion.

Following local lidocaine administration, 17 gauge introducer needle
was advanced into the left liver mass, and three 18 gauge cores were
obtained utilizing continuous ultrasound guidance.

Samples were sent to pathology in formalin.

Needle removed and hemostasis achieved with 5 minutes of manual
compression.

Post procedure ultrasound images showed no evidence of significant
hemorrhage.
IMPRESSION: Ultrasound-guided biopsy of left hepatic mass as above.

## 2021-12-10 ENCOUNTER — Telehealth: Payer: Self-pay | Admitting: *Deleted

## 2021-12-10 NOTE — Telephone Encounter (Signed)
12/10/21-Received incoming fmla from [facility] I had to call Howell Rucks to determine which patient this is for. Form only had daughter's name on the form. Confirmed with Bebe Liter claims that this is for patient Doris Johnson dob: 11/28/1950  Will process this FMLA form for patient/family

## 2021-12-14 ENCOUNTER — Encounter: Payer: Self-pay | Admitting: Oncology

## 2021-12-14 NOTE — Telephone Encounter (Signed)
FMLA faxed to [place] on 12/14/21

## 2021-12-15 ENCOUNTER — Encounter (INDEPENDENT_AMBULATORY_CARE_PROVIDER_SITE_OTHER): Payer: Medicare HMO

## 2021-12-15 ENCOUNTER — Encounter (INDEPENDENT_AMBULATORY_CARE_PROVIDER_SITE_OTHER): Payer: Self-pay | Admitting: Nurse Practitioner

## 2021-12-15 ENCOUNTER — Ambulatory Visit (INDEPENDENT_AMBULATORY_CARE_PROVIDER_SITE_OTHER): Payer: Medicare HMO

## 2021-12-15 ENCOUNTER — Other Ambulatory Visit: Payer: Self-pay

## 2021-12-15 ENCOUNTER — Encounter (INDEPENDENT_AMBULATORY_CARE_PROVIDER_SITE_OTHER): Payer: Self-pay

## 2021-12-15 ENCOUNTER — Ambulatory Visit (INDEPENDENT_AMBULATORY_CARE_PROVIDER_SITE_OTHER): Payer: Medicare HMO | Admitting: Nurse Practitioner

## 2021-12-15 VITALS — BP 191/81 | HR 60 | Resp 16 | Wt 155.4 lb

## 2021-12-15 DIAGNOSIS — I10 Essential (primary) hypertension: Secondary | ICD-10-CM | POA: Diagnosis not present

## 2021-12-15 DIAGNOSIS — I890 Lymphedema, not elsewhere classified: Secondary | ICD-10-CM

## 2021-12-15 DIAGNOSIS — E1169 Type 2 diabetes mellitus with other specified complication: Secondary | ICD-10-CM | POA: Diagnosis not present

## 2021-12-15 DIAGNOSIS — M7989 Other specified soft tissue disorders: Secondary | ICD-10-CM | POA: Diagnosis not present

## 2021-12-15 DIAGNOSIS — M79669 Pain in unspecified lower leg: Secondary | ICD-10-CM | POA: Diagnosis not present

## 2021-12-19 ENCOUNTER — Encounter (INDEPENDENT_AMBULATORY_CARE_PROVIDER_SITE_OTHER): Payer: Self-pay | Admitting: Nurse Practitioner

## 2021-12-19 NOTE — Progress Notes (Signed)
Subjective:    Patient ID: Doris Johnson, female    DOB: 1951-09-03, 71 y.o.   MRN: [id number] Chief Complaint  Patient presents with   Follow-up    Ultrasound follow up    The patient returns to the office for followup evaluation regarding leg swelling.  The swelling has improved quite a bit and the pain associated with swelling has decreased substantially. There have not been any interval development of a ulcerations or wounds.  Since the previous visit the patient has been wearing graduated compression stockings and has noted little significant improvement in the lymphedema. The patient has been using compression routinely morning until night.  The patient also states elevation during the day and exercise is being done too.        Review of Systems  Cardiovascular:  Positive for leg swelling.  All other systems reviewed and are negative.     Objective:   Physical Exam Vitals reviewed.  Cardiovascular:     Rate and Rhythm: Normal rate.     Pulses: Normal pulses.  Pulmonary:     Effort: Pulmonary effort is normal.  Musculoskeletal:     Right lower leg: 2+ Edema present.     Left lower leg: 2+ Edema present.  Skin:    General: Skin is warm and dry.     Comments: Dermal thickening bilaterally  Neurological:     Mental Status: She is alert and oriented to person, place, and time.  Psychiatric:        Mood and Affect: Mood normal.        Behavior: Behavior normal.        Thought Content: Thought content normal.        Judgment: Judgment normal.    BP (!) 191/81 (BP Location: Left Arm)    Pulse 60    Resp 16    Wt 155 lb 6.4 oz (70.5 kg)    BMI 25.08 kg/m   Past Medical History:  Diagnosis Date   Cancer ([place])    Cataract    Diabetes mellitus without complication ([place])    Hyperlipidemia    Hypertension    Hypothyroidism    doctor's keeping an eye on thyroid    Lung cancer ([place])     Social History   Socioeconomic History   Marital status: Single     Spouse name: Not on file   Number of children: Not on file   Years of education: Not on file   Highest education level: Not on file  Occupational History   Not on file  Tobacco Use   Smoking status: Never   Smokeless tobacco: Never  Vaping Use   Vaping Use: Never used  Substance and Sexual Activity   Alcohol use: No   Drug use: No   Sexual activity: Not Currently  Other Topics Concern   Not on file  Social History Narrative   Not on file   Social Determinants of Health   Financial Resource Strain: Not on file  Food Insecurity: Food Insecurity Present   Worried About Running Out of Food in the Last Year: Sometimes true   Ran Out of Food in the Last Year: Sometimes true  Transportation Needs: Unmet Transportation Needs   Lack of Transportation (Medical): Yes   Lack of Transportation (Non-Medical): Yes  Physical Activity: Not on file  Stress: Not on file  Social Connections: Not on file  Intimate Partner Violence: Not on file    Past Surgical History:  Procedure  Laterality Date   ABDOMINAL HYSTERECTOMY     IR CV LINE INJECTION  09/28/2021   PORTA CATH INSERTION N/A 11/30/2020   Procedure: PORTA CATH INSERTION;  Surgeon: Algernon Huxley, MD;  Location: [facility];  Service: Cardiovascular;  Laterality: N/A;   VIDEO BRONCHOSCOPY WITH ENDOBRONCHIAL NAVIGATION N/A 10/21/2020   Procedure: VIDEO BRONCHOSCOPY WITH ENDOBRONCHIAL NAVIGATION;  Surgeon: Ottie Glazier, MD;  Location: [facility];  Service: Thoracic;  Laterality: N/A;   VIDEO BRONCHOSCOPY WITH ENDOBRONCHIAL ULTRASOUND N/A 10/21/2020   Procedure: VIDEO BRONCHOSCOPY WITH ENDOBRONCHIAL ULTRASOUND;  Surgeon: Ottie Glazier, MD;  Location: [facility];  Service: Thoracic;  Laterality: N/A;    Family History  Problem Relation Age of Onset   Cancer Mother        breast   Hypertension Mother    Breast cancer Mother 81   Heart disease Father    Hyperlipidemia Brother    Heart disease Brother     No Known  Allergies  CBC Latest Ref Rng & Units 11/30/2021 11/09/2021 10/19/2021  WBC 4.0 - 10.5 K/uL 3.7(L) 4.6 4.3  Hemoglobin 12.0 - 15.0 g/dL 11.3(L) 11.0(L) 10.6(L)  Hematocrit 36.0 - 46.0 % 35.5(L) 34.7(L) 33.3(L)  Platelets 150 - 400 K/uL 222 258 224      CMP     Component Value Date/Time   NA 137 11/30/2021 0929   NA 141 03/09/2015 0000   K 3.6 11/30/2021 0929   CL 105 11/30/2021 0929   CO2 24 11/30/2021 0929   GLUCOSE 98 11/30/2021 0929   BUN 15 11/30/2021 0929   BUN 11 03/09/2015 0000   CREATININE 0.61 11/30/2021 0929   CALCIUM 9.7 11/30/2021 0929   PROT 7.1 11/30/2021 0929   ALBUMIN 3.9 11/30/2021 0929   AST 19 11/30/2021 0929   ALT 13 11/30/2021 0929   ALKPHOS 75 11/30/2021 0929   BILITOT 0.7 11/30/2021 0929   GFRNONAA >60 11/30/2021 0929     No results found.     Assessment & Plan:   1. Lymphedema Recommend:  No surgery or intervention at this point in time.    I have reviewed my previous discussion with the patient regarding swelling and why it causes symptoms.  Patient will continue wearing graduated compression stockings class 1 (20-30 mmHg) on a daily basis. The patient will  beginning wearing the stockings first thing in the morning and removing them in the evening. The patient is instructed specifically not to sleep in the stockings.    In addition, behavioral modification including several periods of elevation of the lower extremities during the day will be continued.  This was reviewed with the patient during the initial visit.  The patient will also continue routine exercise, especially walking on a daily basis as was discussed during the initial visit.    Despite conservative treatments including graduated compression therapy class 1 and behavioral modification including exercise and elevation the patient  has not obtained adequate control of the lymphedema.  The patient still has stage 3 lymphedema and therefore, I believe that a lymph pump should be  added to improve the control of the patient's lymphedema.  Additionally, a lymph pump is warranted because it will reduce the risk of cellulitis and ulceration in the future.  Patient should follow-up in six months    2. Primary hypertension Continue antihypertensive medications as already ordered, these medications have been reviewed and there are no changes at this time.   3. Type 2 diabetes mellitus with other specified complication, unspecified  whether long term insulin use ([place]) Continue hypoglycemic medications as already ordered, these medications have been reviewed and there are no changes at this time.  Hgb A1C to be monitored as already arranged by primary service    Current Outpatient Medications on File Prior to Visit  Medication Sig Dispense Refill   fluticasone (FLONASE) 50 MCG/ACT nasal spray Place 2 sprays into both nostrils daily. 16 g 5   lidocaine-prilocaine (EMLA) cream Apply 1 application topically as needed. Apply small amount to port site at least 1 hour prior to it being accessed, cover with plastic wrap 30 g 1   losartan (COZAAR) 25 MG tablet Take 25 mg by mouth daily.     mirtazapine (REMERON) 15 MG tablet Take 15 mg by mouth at bedtime.     oxycodone (ROXICODONE) 30 MG immediate release tablet Take 1 tablet (30 mg total) by mouth every 4 (four) hours as needed for pain. 180 tablet 0   polyethylene glycol (MIRALAX / GLYCOLAX) 17 g packet Take 17 g by mouth daily.     potassium chloride SA (KLOR-CON M) 20 MEQ tablet Take 2 tablets (40 mEq total) by mouth daily. 7 tablet 0   furosemide (LASIX) 20 MG tablet Take 1 tablet (20 mg total) by mouth daily. (Patient not taking: Reported on 11/30/2021) 60 tablet 0   LORazepam (ATIVAN) 0.5 MG tablet Take 1 tablet (0.5 mg total) by mouth every 6 (six) hours as needed (Nausea or vomiting). (Patient not taking: Reported on 09/28/2021) 30 tablet 0   naloxone (NARCAN) nasal spray 4 mg/0.1 mL  (Patient not taking: Reported on  10/19/2021)     ondansetron (ZOFRAN) 8 MG tablet Take 1 tablet (8 mg total) by mouth 2 (two) times daily as needed for refractory nausea / vomiting. Start on day 3 after carboplatin chemo. (Patient not taking: Reported on 10/19/2021) 30 tablet 1   prochlorperazine (COMPAZINE) 10 MG tablet Take 1 tablet (10 mg total) by mouth every 6 (six) hours as needed (Nausea or vomiting). (Patient not taking: Reported on 10/19/2021) 30 tablet 1   Current Facility-Administered Medications on File Prior to Visit  Medication Dose Route Frequency Provider Last Rate Last Admin   sodium chloride flush (NS) 0.9 % injection 10 mL  10 mL Intravenous PRN Sindy Guadeloupe, MD   10 mL at 12/15/20 0850    There are no Patient Instructions on file for this visit. No follow-ups on file.   Kris Hartmann, NP

## 2021-12-21 ENCOUNTER — Inpatient Hospital Stay: Payer: Medicare HMO

## 2021-12-21 ENCOUNTER — Other Ambulatory Visit: Payer: Self-pay

## 2021-12-21 ENCOUNTER — Encounter: Payer: Self-pay | Admitting: Licensed Clinical Social Worker

## 2021-12-21 ENCOUNTER — Inpatient Hospital Stay: Payer: Medicare HMO | Attending: Oncology

## 2021-12-21 ENCOUNTER — Encounter: Payer: Self-pay | Admitting: Oncology

## 2021-12-21 ENCOUNTER — Inpatient Hospital Stay (HOSPITAL_BASED_OUTPATIENT_CLINIC_OR_DEPARTMENT_OTHER): Payer: Medicare HMO | Admitting: Oncology

## 2021-12-21 VITALS — BP 161/66 | HR 61 | Temp 97.0°F | Resp 16 | Ht 66.0 in | Wt 156.5 lb

## 2021-12-21 VITALS — BP 156/70 | HR 58

## 2021-12-21 DIAGNOSIS — I890 Lymphedema, not elsewhere classified: Secondary | ICD-10-CM | POA: Diagnosis not present

## 2021-12-21 DIAGNOSIS — Z8349 Family history of other endocrine, nutritional and metabolic diseases: Secondary | ICD-10-CM | POA: Diagnosis not present

## 2021-12-21 DIAGNOSIS — Z5982 Transportation insecurity: Secondary | ICD-10-CM | POA: Insufficient documentation

## 2021-12-21 DIAGNOSIS — E049 Nontoxic goiter, unspecified: Secondary | ICD-10-CM | POA: Diagnosis not present

## 2021-12-21 DIAGNOSIS — Z5112 Encounter for antineoplastic immunotherapy: Secondary | ICD-10-CM | POA: Insufficient documentation

## 2021-12-21 DIAGNOSIS — I872 Venous insufficiency (chronic) (peripheral): Secondary | ICD-10-CM | POA: Diagnosis not present

## 2021-12-21 DIAGNOSIS — Z8249 Family history of ischemic heart disease and other diseases of the circulatory system: Secondary | ICD-10-CM | POA: Insufficient documentation

## 2021-12-21 DIAGNOSIS — R519 Headache, unspecified: Secondary | ICD-10-CM | POA: Insufficient documentation

## 2021-12-21 DIAGNOSIS — C3412 Malignant neoplasm of upper lobe, left bronchus or lung: Secondary | ICD-10-CM | POA: Insufficient documentation

## 2021-12-21 DIAGNOSIS — C3490 Malignant neoplasm of unspecified part of unspecified bronchus or lung: Secondary | ICD-10-CM

## 2021-12-21 DIAGNOSIS — I10 Essential (primary) hypertension: Secondary | ICD-10-CM | POA: Diagnosis not present

## 2021-12-21 DIAGNOSIS — Z79899 Other long term (current) drug therapy: Secondary | ICD-10-CM | POA: Insufficient documentation

## 2021-12-21 DIAGNOSIS — J479 Bronchiectasis, uncomplicated: Secondary | ICD-10-CM | POA: Insufficient documentation

## 2021-12-21 DIAGNOSIS — R0789 Other chest pain: Secondary | ICD-10-CM | POA: Diagnosis not present

## 2021-12-21 DIAGNOSIS — E042 Nontoxic multinodular goiter: Secondary | ICD-10-CM | POA: Insufficient documentation

## 2021-12-21 DIAGNOSIS — Z87891 Personal history of nicotine dependence: Secondary | ICD-10-CM | POA: Diagnosis not present

## 2021-12-21 DIAGNOSIS — C787 Secondary malignant neoplasm of liver and intrahepatic bile duct: Secondary | ICD-10-CM | POA: Insufficient documentation

## 2021-12-21 DIAGNOSIS — Z803 Family history of malignant neoplasm of breast: Secondary | ICD-10-CM | POA: Insufficient documentation

## 2021-12-21 DIAGNOSIS — E119 Type 2 diabetes mellitus without complications: Secondary | ICD-10-CM | POA: Insufficient documentation

## 2021-12-21 DIAGNOSIS — Z5941 Food insecurity: Secondary | ICD-10-CM | POA: Diagnosis not present

## 2021-12-21 DIAGNOSIS — G893 Neoplasm related pain (acute) (chronic): Secondary | ICD-10-CM | POA: Insufficient documentation

## 2021-12-21 DIAGNOSIS — R5383 Other fatigue: Secondary | ICD-10-CM | POA: Diagnosis not present

## 2021-12-21 LAB — CBC WITH DIFFERENTIAL/PLATELET
Abs Immature Granulocytes: 0.01 10*3/uL (ref 0.00–0.07)
Basophils Absolute: 0 10*3/uL (ref 0.0–0.1)
Basophils Relative: 0 %
Eosinophils Absolute: 0.2 10*3/uL (ref 0.0–0.5)
Eosinophils Relative: 5 %
HCT: 37.5 % (ref 36.0–46.0)
Hemoglobin: 12.3 g/dL (ref 12.0–15.0)
Immature Granulocytes: 0 %
Lymphocytes Relative: 19 %
Lymphs Abs: 0.8 10*3/uL (ref 0.7–4.0)
MCH: 28 pg (ref 26.0–34.0)
MCHC: 32.8 g/dL (ref 30.0–36.0)
MCV: 85.4 fL (ref 80.0–100.0)
Monocytes Absolute: 0.3 10*3/uL (ref 0.1–1.0)
Monocytes Relative: 7 %
Neutro Abs: 2.8 10*3/uL (ref 1.7–7.7)
Neutrophils Relative %: 69 %
Platelets: 193 10*3/uL (ref 150–400)
RBC: 4.39 MIL/uL (ref 3.87–5.11)
RDW: 16.2 % — ABNORMAL HIGH (ref 11.5–15.5)
WBC: 4.1 10*3/uL (ref 4.0–10.5)
nRBC: 0 % (ref 0.0–0.2)

## 2021-12-21 LAB — COMPREHENSIVE METABOLIC PANEL
ALT: 15 U/L (ref 0–44)
AST: 18 U/L (ref 15–41)
Albumin: 4.1 g/dL (ref 3.5–5.0)
Alkaline Phosphatase: 78 U/L (ref 38–126)
Anion gap: 6 (ref 5–15)
BUN: 11 mg/dL (ref 8–23)
CO2: 26 mmol/L (ref 22–32)
Calcium: 9.6 mg/dL (ref 8.9–10.3)
Chloride: 103 mmol/L (ref 98–111)
Creatinine, Ser: 0.6 mg/dL (ref 0.44–1.00)
GFR, Estimated: >60 mL/min (ref >60)
Glucose, Bld: 108 mg/dL — ABNORMAL HIGH (ref 70–99)
Potassium: 3.5 mmol/L (ref 3.5–5.1)
Sodium: 135 mmol/L (ref 135–145)
Total Bilirubin: 0.5 mg/dL (ref 0.3–1.2)
Total Protein: 7.3 g/dL (ref 6.5–8.1)

## 2021-12-21 MED ORDER — SODIUM CHLORIDE 0.9 % IV SOLN
1200.0000 mg | Freq: Once | INTRAVENOUS | Status: AC
  Administered 2021-12-21: 1200 mg via INTRAVENOUS
  Filled 2021-12-21: qty 20

## 2021-12-21 MED ORDER — HEPARIN SOD (PORK) LOCK FLUSH 100 UNIT/ML IV SOLN
500.0000 [IU] | Freq: Once | INTRAVENOUS | Status: AC | PRN
  Administered 2021-12-21: 500 [IU]
  Filled 2021-12-21: qty 5

## 2021-12-21 MED ORDER — SODIUM CHLORIDE 0.9 % IV SOLN
Freq: Once | INTRAVENOUS | Status: AC
  Filled 2021-12-21: qty 250

## 2021-12-21 MED ORDER — HEPARIN SOD (PORK) LOCK FLUSH 100 UNIT/ML IV SOLN
INTRAVENOUS | Status: AC
  Filled 2021-12-21: qty 5

## 2021-12-21 NOTE — Patient Instructions (Signed)
[facility]  Discharge Instructions: °Thank you for choosing [facility] to provide your oncology and hematology care.  ° °If you have a lab appointment with the Cancer Center, please go directly to the Cancer Center and check in at the registration area. °  °Wear comfortable clothing and clothing appropriate for easy access to any Portacath or PICC line.  ° °We strive to give you quality time with your provider. You may need to reschedule your appointment if you arrive late (15 or more minutes).  Arriving late affects you and other patients whose appointments are after yours.  Also, if you miss three or more appointments without notifying the office, you may be dismissed from the clinic at the provider’s discretion.    °  °For prescription refill requests, have your pharmacy contact our office and allow 72 hours for refills to be completed.   ° °Today you received the following chemotherapy and/or immunotherapy agents     °  °To help prevent nausea and vomiting after your treatment, we encourage you to take your nausea medication as directed. ° °BELOW ARE SYMPTOMS THAT SHOULD BE REPORTED IMMEDIATELY: °*FEVER GREATER THAN 100.4 F (38 °C) OR HIGHER °*CHILLS OR SWEATING °*NAUSEA AND VOMITING THAT IS NOT CONTROLLED WITH YOUR NAUSEA MEDICATION °*UNUSUAL SHORTNESS OF BREATH °*UNUSUAL BRUISING OR BLEEDING °*URINARY PROBLEMS (pain or burning when urinating, or frequent urination) °*BOWEL PROBLEMS (unusual diarrhea, constipation, pain near the anus) °TENDERNESS IN MOUTH AND THROAT WITH OR WITHOUT PRESENCE OF ULCERS (sore throat, sores in mouth, or a toothache) °UNUSUAL RASH, SWELLING OR PAIN  °UNUSUAL VAGINAL DISCHARGE OR ITCHING  ° °Items with * indicate a potential emergency and should be followed up as soon as possible or go to the Emergency Department if any problems should occur. ° °Please show the CHEMOTHERAPY ALERT CARD or IMMUNOTHERAPY ALERT CARD at check-in to the  Emergency Department and triage nurse. ° °Should you have questions after your visit or need to cancel or reschedule your appointment, please contact [facility]  Dept: [phone]  and follow the prompts.  Office hours are 8:00 a.m. to 4:30 p.m. Monday - Friday. Please note that voicemails left after 4:00 p.m. may not be returned until the following business day.  We are closed weekends and major holidays. You have access to a nurse at all times for urgent questions. Please call the main number to the clinic Dept: [phone] and follow the prompts. ° ° °For any non-urgent questions, you may also contact your provider using MyChart. We now offer e-Visits for anyone 18 and older to request care online for non-urgent symptoms. For details visit [email or web address]. °  °Also download the MyChart app! Go to the app store, search "MyChart", open the app, select [facility], and log in with your MyChart username and password. ° °Due to Covid, a mask is required upon entering the hospital/clinic. If you do not have a mask, one will be given to you upon arrival. For doctor visits, patients may have 1 support person aged 18 or older with them. For treatment visits, patients cannot have anyone with them due to current Covid guidelines and our immunocompromised population.  ° °

## 2021-12-21 NOTE — Progress Notes (Signed)
[facility] Work  Clinical Social Work was referred by T. Wrenn RN for assessment of psychosocial needs.  Clinical Social Worker contacted patient by phone  to offer support and assess for needs.  Patient stated she would like assistance with transportation and with utility bills.  CSW informed the patient she is eligible for transportation thru Medicaid and they will assist with transportation for appointments.  CSW stated the patient will need to contact Medicaid directly and arrange transportation with them.  Patient verbalized understanding.  Patient stated she and her daughter moved to a new apartment and she would need assistance with the electricity bill this month.  CSW informed patient on available assistance via [facility].  CSW requested patient contact CSW once she receives bill and I would refer her to the Designer, jewellery.  Patient verbalized understanding.  Patient stated she does not have further needs or concerns at this time.      Adelene Amas, [place] Worker [facility]

## 2021-12-21 NOTE — Progress Notes (Signed)
Hematology/Oncology Consult note [facility]  [phone] [phone]  Patient Care Team: Leonel Ramsay, MD as PCP - General (Infectious Diseases) Telford Nab, RN as Oncology Nurse Navigator Sindy Guadeloupe, MD as Consulting Physician (Hematology and Oncology)   Name of the patient: Doris Johnson  [id number]  May 08, 1951   Date of visit: 12/21/21  Diagnosis- extensive stage small cell lung cancer with liver metastases    Chief complaint/ Reason for visit-on treatment assessment prior to cycle 13 of maintenance Tecentriq  Heme/Onc history: Patient is a 71 year old female with a remote history of smoking in her teenage years and exposure to passive smoking.  She has been having ongoing midsternal pain which has been growing since the last 6 to 7 months.  She had been to urgent care as well in the past.  She finally had a CT chest with contrast done in October 2021 which showed a large mass in the left side of the mediastinum measuring 6.7 x 6.2 x 6.1 cm causing severe compression of the left main pulmonary artery and around the aortic arch in the left subclavian artery.  Enlarged thyroid gland.  Left upper lobe nodule 1 x 0.7 cm.  She then underwent bronchoscopy with Dr.Aleskerov left upper lobe biopsy was nondiagnostic.  However station 4, 7 and 10 L lymph nodes were consistent with metastatic small cell carcinoma.   PET CT scan showed enlarged level 3 cervical lymph node 2.2 cm that was hypermetabolic with an SUV of 9.7.  Large central 7.3 cm AP window mass.  5.7 cm left liver mass.  MRI brain with and without contrast showed 2 small foci of enhancement in the right cerebellar hemisphere and right temporal lobe without mass-effect or edema as well concerning for brain metastases   Patient had 2 cycles of carbo etoposide chemotherapy along with Tecentriq and subsequent scan showed no significant improvement in the primary lung mass or liver  mass.  She received radiation treatment to her primary lung mass and also seen by [facility] for second opinion.  Her pathology was reviewed at [facility] and reported as possible neuroendocrine neoplasm But stated that small cell carcinoma cannot be excluded but not definitely diagnostic for it.  However [facility] pathology feels that this is consistent with small cell lung cancer.  She has completed 4 cycles of carbo etoposide Tecentriq chemotherapy and is currently on maintenance Tecentriq.  Repeat liver biopsy was also performed and was consistent with small cell lung cancer  Interval history-tolerating treatment well so far.  Was seen by vascular surgery for lower extremity lymphedema and will be given a lymphedema pump soon.  Chest wall pain is relatively well controlled.  Denies any constipation  ECOG PS- 1 Pain scale- 0   Review of systems- Review of Systems  Constitutional:  Positive for malaise/fatigue. Negative for chills, fever and weight loss.  HENT:  Negative for congestion, ear discharge and nosebleeds.   Eyes:  Negative for blurred vision.  Respiratory:  Negative for cough, hemoptysis, sputum production, shortness of breath and wheezing.   Cardiovascular:  Negative for chest pain, palpitations, orthopnea and claudication.  Gastrointestinal:  Negative for abdominal pain, blood in stool, constipation, diarrhea, heartburn, melena, nausea and vomiting.  Genitourinary:  Negative for dysuria, flank pain, frequency, hematuria and urgency.  Musculoskeletal:  Negative for back pain, joint pain and myalgias.  Skin:  Negative for rash.  Neurological:  Negative for dizziness, tingling, focal weakness, seizures, weakness and headaches.  Endo/Heme/Allergies:  Does not bruise/bleed easily.  Psychiatric/Behavioral:  Negative for depression and suicidal ideas. The patient does not have insomnia.      No Known Allergies   Past Medical History:  Diagnosis Date   Cancer ([place])    Cataract    Diabetes  mellitus without complication ([place])    Hyperlipidemia    Hypertension    Hypothyroidism    doctor's keeping an eye on thyroid    Lung cancer [facility])      Past Surgical History:  Procedure Laterality Date   ABDOMINAL HYSTERECTOMY     IR CV LINE INJECTION  09/28/2021   PORTA CATH INSERTION N/A 11/30/2020   Procedure: PORTA CATH INSERTION;  Surgeon: Algernon Huxley, MD;  Location: [place] [facility];  Service: Cardiovascular;  Laterality: N/A;   VIDEO BRONCHOSCOPY WITH ENDOBRONCHIAL NAVIGATION N/A 10/21/2020   Procedure: VIDEO BRONCHOSCOPY WITH ENDOBRONCHIAL NAVIGATION;  Surgeon: Ottie Glazier, MD;  Location: [facility];  Service: Thoracic;  Laterality: N/A;   VIDEO BRONCHOSCOPY WITH ENDOBRONCHIAL ULTRASOUND N/A 10/21/2020   Procedure: VIDEO BRONCHOSCOPY WITH ENDOBRONCHIAL ULTRASOUND;  Surgeon: Ottie Glazier, MD;  Location: [facility];  Service: Thoracic;  Laterality: N/A;    Social History   Socioeconomic History   Marital status: Single    Spouse name: Not on file   Number of children: Not on file   Years of education: Not on file   Highest education level: Not on file  Occupational History   Not on file  Tobacco Use   Smoking status: Never   Smokeless tobacco: Never  Vaping Use   Vaping Use: Never used  Substance and Sexual Activity   Alcohol use: No   Drug use: No   Sexual activity: Not Currently  Other Topics Concern   Not on file  Social History Narrative   Not on file   Social Determinants of Health   Financial Resource Strain: Not on file  Food Insecurity: Food Insecurity Present   Worried About Running Out of Food in the Last Year: Sometimes true   Arboriculturist in the Last Year: Sometimes true  Transportation Needs: Unmet Transportation Needs   Lack of Transportation (Medical): Yes   Lack of Transportation (Non-Medical): Yes  Physical Activity: Not on file  Stress: Not on file  Social Connections: Not on file  Intimate Partner Violence: Not on file     Family History  Problem Relation Age of Onset   Cancer Mother        breast   Hypertension Mother    Breast cancer Mother 27   Heart disease Father    Hyperlipidemia Brother    Heart disease Brother      Current Outpatient Medications:    fluticasone (FLONASE) 50 MCG/ACT nasal spray, Place 2 sprays into both nostrils daily., Disp: 16 g, Rfl: 5   lidocaine-prilocaine (EMLA) cream, Apply 1 application topically as needed. Apply small amount to port site at least 1 hour prior to it being accessed, cover with plastic wrap, Disp: 30 g, Rfl: 1   losartan (COZAAR) 25 MG tablet, Take 25 mg by mouth daily., Disp: , Rfl:    oxycodone (ROXICODONE) 30 MG immediate release tablet, Take 1 tablet (30 mg total) by mouth every 4 (four) hours as needed for pain., Disp: 180 tablet, Rfl: 0   potassium chloride SA (KLOR-CON M) 20 MEQ tablet, Take 2 tablets (40 mEq total) by mouth daily., Disp: 7 tablet, Rfl: 0   furosemide (LASIX) 20 MG tablet, Take 1  tablet (20 mg total) by mouth daily. (Patient not taking: Reported on 11/30/2021), Disp: 60 tablet, Rfl: 0   LORazepam (ATIVAN) 0.5 MG tablet, Take 1 tablet (0.5 mg total) by mouth every 6 (six) hours as needed (Nausea or vomiting). (Patient not taking: Reported on 09/28/2021), Disp: 30 tablet, Rfl: 0   mirtazapine (REMERON) 15 MG tablet, Take 15 mg by mouth at bedtime. (Patient not taking: Reported on 12/21/2021), Disp: , Rfl:    naloxone (NARCAN) nasal spray 4 mg/0.1 mL, , Disp: , Rfl:    ondansetron (ZOFRAN) 8 MG tablet, Take 1 tablet (8 mg total) by mouth 2 (two) times daily as needed for refractory nausea / vomiting. Start on day 3 after carboplatin chemo. (Patient not taking: Reported on 10/19/2021), Disp: 30 tablet, Rfl: 1   polyethylene glycol (MIRALAX / GLYCOLAX) 17 g packet, Take 17 g by mouth daily. (Patient not taking: Reported on 12/21/2021), Disp: , Rfl:    prochlorperazine (COMPAZINE) 10 MG tablet, Take 1 tablet (10 mg total) by mouth every 6 (six)  hours as needed (Nausea or vomiting). (Patient not taking: Reported on 10/19/2021), Disp: 30 tablet, Rfl: 1   traZODone (DESYREL) 50 MG tablet, Take by mouth. (Patient not taking: Reported on 12/21/2021), Disp: , Rfl:  No current facility-administered medications for this visit.  Facility-Administered Medications Ordered in Other Visits:    heparin lock flush 100 UNIT/ML injection, , , ,    sodium chloride flush (NS) 0.9 % injection 10 mL, 10 mL, Intravenous, PRN, Sindy Guadeloupe, MD, 10 mL at 12/15/20 0850  Physical exam:  Vitals:   12/21/21 0900 12/21/21 0932  BP: (!) 161/66   Pulse: 61   Resp: 16 16  Temp: (!) 97 F (36.1 C)   TempSrc: Tympanic   SpO2: 100%   Weight: 156 lb (70.8 kg) 156 lb 8 oz (71 kg)  Height: 5\' 6"  (1.676 m) 5\' 6"  (1.676 m)   Physical Exam Constitutional:      General: She is not in acute distress. Cardiovascular:     Rate and Rhythm: Normal rate and regular rhythm.     Heart sounds: Normal heart sounds.  Pulmonary:     Effort: Pulmonary effort is normal.     Breath sounds: Normal breath sounds.  Abdominal:     General: Bowel sounds are normal.     Palpations: Abdomen is soft.  Skin:    General: Skin is warm and dry.  Neurological:     Mental Status: She is alert and oriented to person, place, and time.     CMP Latest Ref Rng & Units 12/21/2021  Glucose 70 - 99 mg/dL 108(H)  BUN 8 - 23 mg/dL 11  Creatinine 0.44 - 1.00 mg/dL 0.60  Sodium 135 - 145 mmol/L 135  Potassium 3.5 - 5.1 mmol/L 3.5  Chloride 98 - 111 mmol/L 103  CO2 22 - 32 mmol/L 26  Calcium 8.9 - 10.3 mg/dL 9.6  Total Protein 6.5 - 8.1 g/dL 7.3  Total Bilirubin 0.3 - 1.2 mg/dL 0.5  Alkaline Phos 38 - 126 U/L 78  AST 15 - 41 U/L 18  ALT 0 - 44 U/L 15   CBC Latest Ref Rng & Units 12/21/2021  WBC 4.0 - 10.5 K/uL 4.1  Hemoglobin 12.0 - 15.0 g/dL 12.3  Hematocrit 36.0 - 46.0 % 37.5  Platelets 150 - 400 K/uL 193    No images are attached to the encounter.  VAS [place] LOWER EXTREMITY VENOUS  REFLUX  Result Date:  12/16/2021  Lower Venous Reflux Study Patient Name:  HOLLYN STUCKY [facility]  Date of Exam:   12/15/2021 Medical Rec #: [id number]          Accession #:    [id number] Date of Birth: Mar 09, 1951           Patient Gender: F Patient Age:   1 years Exam Location:   [facility] Procedure:      VAS [place] LOWER EXTREMITY VENOUS REFLUX Referring Phys: Hortencia Pilar --------------------------------------------------------------------------------  Indications: Swelling in feet.  Performing Technologist: Concha Norway RVT  Examination Guidelines: A complete evaluation includes B-mode imaging, spectral Doppler, color Doppler, and power Doppler as needed of all accessible portions of each vessel. Bilateral testing is considered an integral part of a complete examination. Limited examinations for reoccurring indications may be performed as noted. The reflux portion of the exam is performed with the patient in reverse Trendelenburg. Significant venous reflux is defined as >500 ms in the superficial venous system, and >1 second in the deep venous system.   Summary: Bilateral: - No evidence of deep vein thrombosis seen in the lower extremities, bilaterally, from the common femoral through the popliteal veins. - No evidence of superficial venous thrombosis in the lower extremities, bilaterally. - No evidence of deep venous insufficiency seen bilaterally in the lower extremity. - No evidence of superficial venous reflux seen in the greater saphenous veins bilaterally. - No evidence of superficial venous reflux seen in the short saphenous veins bilaterally.  *See table(s) above for measurements and observations. Electronically signed by Hortencia Pilar MD on 12/16/2021 at 3:10:30 PM.    Final      Assessment and plan- Patient is a 71 y.o. female  with extensive stage small cell lung cancer with liver metastases.  She is here for on treatment assessment prior to cycle 13 of maintenance Tecentriq  Counts okay to  proceed with cycle 13 of maintenance Tecentriq today.  I will see her back in 3 weeks for cycle 14.  She has repeat scans scheduled for next week.  Patient is tolerating immunotherapy well without any significant side effects.  Bilateral lower extremity lymphedema: Patient follows up with vascular surgery and wears compression stockings.  Neoplasm related pain: Currently stable with as needed oxycodone but patient is not using much of it   Visit Diagnosis 1. Malignant neoplasm of lung, unspecified laterality, unspecified part of lung ([place])   2. Encounter for antineoplastic immunotherapy      Dr. Randa Evens, MD, MPH [facility] at [facility] [phone] 12/21/2021 12:36 PM

## 2021-12-24 ENCOUNTER — Telehealth: Payer: Self-pay | Admitting: Licensed Clinical Social Worker

## 2021-12-28 ENCOUNTER — Telehealth: Payer: Self-pay | Admitting: Oncology

## 2021-12-28 NOTE — Telephone Encounter (Signed)
Telephone conversation with patient based upon referral from L-CSW.  Patient approved for [place] one-time $1000 [place].  Reviewed with patient that grant assists with personal expenses while undergoing treatment.  Provided patient my contact information for any additional questions.

## 2021-12-29 ENCOUNTER — Ambulatory Visit
Admission: RE | Admit: 2021-12-29 | Discharge: 2021-12-29 | Disposition: A | Discharge status: Home / Self Care | Payer: Medicare HMO | Source: Ambulatory Visit | Attending: Oncology | Admitting: Oncology

## 2021-12-29 DIAGNOSIS — C787 Secondary malignant neoplasm of liver and intrahepatic bile duct: Secondary | ICD-10-CM | POA: Diagnosis present

## 2021-12-29 DIAGNOSIS — C3490 Malignant neoplasm of unspecified part of unspecified bronchus or lung: Secondary | ICD-10-CM | POA: Diagnosis present

## 2021-12-29 MED ORDER — IOHEXOL 300 MG/ML  SOLN
80.0000 mL | Freq: Once | INTRAMUSCULAR | Status: AC | PRN
  Administered 2021-12-29: 80 mL via INTRAVENOUS

## 2022-01-04 ENCOUNTER — Other Ambulatory Visit: Payer: Self-pay | Admitting: *Deleted

## 2022-01-04 MED ORDER — OXYCODONE HCL 30 MG PO TABS: 30.0000 mg | ORAL_TABLET | ORAL | 0 refills | Status: DC | PRN

## 2022-01-11 ENCOUNTER — Encounter: Payer: Self-pay | Admitting: Oncology

## 2022-01-11 ENCOUNTER — Inpatient Hospital Stay: Payer: Medicare HMO

## 2022-01-11 ENCOUNTER — Other Ambulatory Visit: Payer: Self-pay

## 2022-01-11 ENCOUNTER — Inpatient Hospital Stay (HOSPITAL_BASED_OUTPATIENT_CLINIC_OR_DEPARTMENT_OTHER): Payer: Medicare HMO | Admitting: Oncology

## 2022-01-11 VITALS — BP 147/76 | HR 53 | Temp 98.7°F | Resp 20 | Ht 66.0 in | Wt 154.3 lb

## 2022-01-11 DIAGNOSIS — Z5112 Encounter for antineoplastic immunotherapy: Secondary | ICD-10-CM | POA: Diagnosis not present

## 2022-01-11 DIAGNOSIS — C787 Secondary malignant neoplasm of liver and intrahepatic bile duct: Secondary | ICD-10-CM

## 2022-01-11 DIAGNOSIS — C3490 Malignant neoplasm of unspecified part of unspecified bronchus or lung: Secondary | ICD-10-CM

## 2022-01-11 DIAGNOSIS — G9389 Other specified disorders of brain: Secondary | ICD-10-CM | POA: Diagnosis not present

## 2022-01-11 LAB — CBC WITH DIFFERENTIAL/PLATELET
Abs Immature Granulocytes: 0.01 10*3/uL (ref 0.00–0.07)
Basophils Absolute: 0 10*3/uL (ref 0.0–0.1)
Basophils Relative: 0 %
Eosinophils Absolute: 0.1 10*3/uL (ref 0.0–0.5)
Eosinophils Relative: 4 %
HCT: 39.1 % (ref 36.0–46.0)
Hemoglobin: 12.7 g/dL (ref 12.0–15.0)
Immature Granulocytes: 0 %
Lymphocytes Relative: 30 %
Lymphs Abs: 0.9 10*3/uL (ref 0.7–4.0)
MCH: 28.3 pg (ref 26.0–34.0)
MCHC: 32.5 g/dL (ref 30.0–36.0)
MCV: 87.1 fL (ref 80.0–100.0)
Monocytes Absolute: 0.3 10*3/uL (ref 0.1–1.0)
Monocytes Relative: 10 %
Neutro Abs: 1.7 10*3/uL (ref 1.7–7.7)
Neutrophils Relative %: 56 %
Platelets: 192 10*3/uL (ref 150–400)
RBC: 4.49 MIL/uL (ref 3.87–5.11)
RDW: 15.4 % (ref 11.5–15.5)
WBC: 3.1 10*3/uL — ABNORMAL LOW (ref 4.0–10.5)
nRBC: 0 % (ref 0.0–0.2)

## 2022-01-11 LAB — COMPREHENSIVE METABOLIC PANEL
ALT: 26 U/L (ref 0–44)
AST: 24 U/L (ref 15–41)
Albumin: 3.9 g/dL (ref 3.5–5.0)
Alkaline Phosphatase: 74 U/L (ref 38–126)
Anion gap: 7 (ref 5–15)
BUN: 12 mg/dL (ref 8–23)
CO2: 27 mmol/L (ref 22–32)
Calcium: 9.6 mg/dL (ref 8.9–10.3)
Chloride: 104 mmol/L (ref 98–111)
Creatinine, Ser: 0.7 mg/dL (ref 0.44–1.00)
GFR, Estimated: >60 mL/min (ref >60)
Glucose, Bld: 111 mg/dL — ABNORMAL HIGH (ref 70–99)
Potassium: 3.3 mmol/L — ABNORMAL LOW (ref 3.5–5.1)
Sodium: 138 mmol/L (ref 135–145)
Total Bilirubin: 0.5 mg/dL (ref 0.3–1.2)
Total Protein: 7.3 g/dL (ref 6.5–8.1)

## 2022-01-11 MED ORDER — HEPARIN SOD (PORK) LOCK FLUSH 100 UNIT/ML IV SOLN
500.0000 [IU] | Freq: Once | INTRAVENOUS | Status: AC | PRN
  Administered 2022-01-11: 500 [IU]
  Filled 2022-01-11: qty 5

## 2022-01-11 MED ORDER — SODIUM CHLORIDE 0.9 % IV SOLN
1200.0000 mg | Freq: Once | INTRAVENOUS | Status: AC
  Administered 2022-01-11: 1200 mg via INTRAVENOUS
  Filled 2022-01-11: qty 20

## 2022-01-11 MED ORDER — SODIUM CHLORIDE 0.9 % IV SOLN
Freq: Once | INTRAVENOUS | Status: AC
  Filled 2022-01-11: qty 250

## 2022-01-11 NOTE — Progress Notes (Signed)
Patient states she is fatigued.

## 2022-01-11 NOTE — Progress Notes (Signed)
Hematology/Oncology Consult note [facility]  Telephone:([phone] Fax:([phone]  Patient Care Team: Leonel Ramsay, MD as PCP - General (Infectious Diseases) Telford Nab, RN as Oncology Nurse Navigator Sindy Guadeloupe, MD as Consulting Physician (Hematology and Oncology)   Name of the patient: Doris Johnson  [id number]  29-Aug-1951   Date of visit: 01/11/22  Diagnosis- extensive stage small cell lung cancer with liver metastases  Chief complaint/ Reason for visit-on treatment assessment prior to cycle 14 of maintenance Tecentriq  Heme/Onc history: Patient is a 71 year old female with a remote history of smoking in her teenage years and exposure to passive smoking.  She has been having ongoing midsternal pain which has been growing since the last 6 to 7 months.  She had been to urgent care as well in the past.  She finally had a CT chest with contrast done in October 2021 which showed a large mass in the left side of the mediastinum measuring 6.7 x 6.2 x 6.1 cm causing severe compression of the left main pulmonary artery and around the aortic arch in the left subclavian artery.  Enlarged thyroid gland.  Left upper lobe nodule 1 x 0.7 cm.  She then underwent bronchoscopy with Dr.Aleskerov left upper lobe biopsy was nondiagnostic.  However station 4, 7 and 10 L lymph nodes were consistent with metastatic small cell carcinoma.   PET CT scan showed enlarged level 3 cervical lymph node 2.2 cm that was hypermetabolic with an SUV of 9.7.  Large central 7.3 cm AP window mass.  5.7 cm left liver mass.  MRI brain with and without contrast showed 2 small foci of enhancement in the right cerebellar hemisphere and right temporal lobe without mass-effect or edema as well concerning for brain metastases   Patient had 2 cycles of carbo etoposide chemotherapy along with Tecentriq and subsequent scan showed no significant improvement in the primary lung mass or liver  mass.  She received radiation treatment to her primary lung mass and also seen by [facility] for second opinion.  Her pathology was reviewed at [facility] and reported as possible neuroendocrine neoplasm But stated that small cell carcinoma cannot be excluded but not definitely diagnostic for it.  However [facility] pathology feels that this is consistent with small cell lung cancer.  She has completed 4 cycles of carbo etoposide Tecentriq chemotherapy and is currently on maintenance Tecentriq.  Repeat liver biopsy was also performed and was consistent with small cell lung cancer  Patient has had stable disease on maintenance Tecentriq so far and she has been on single agent Tecentriq since April 2022  Interval history-overall doing well.  She reports mild chronic fatigue.  Bowel movements are regular.  Chest wall pain is well controlled.  Reports occasional headaches which are self-limited  ECOG PS- 1 Pain scale- 0   Review of systems- Review of Systems  Constitutional:  Positive for malaise/fatigue. Negative for chills, fever and weight loss.  HENT:  Negative for congestion, ear discharge and nosebleeds.   Eyes:  Negative for blurred vision.  Respiratory:  Negative for cough, hemoptysis, sputum production, shortness of breath and wheezing.   Cardiovascular:  Negative for chest pain, palpitations, orthopnea and claudication.  Gastrointestinal:  Negative for abdominal pain, blood in stool, constipation, diarrhea, heartburn, melena, nausea and vomiting.  Genitourinary:  Negative for dysuria, flank pain, frequency, hematuria and urgency.  Musculoskeletal:  Negative for back pain, joint pain and myalgias.  Skin:  Negative for rash.  Neurological:  Negative for dizziness, tingling, focal weakness, seizures, weakness and headaches.  Endo/Heme/Allergies:  Does not bruise/bleed easily.  Psychiatric/Behavioral:  Negative for depression and suicidal ideas. The patient does not have insomnia.       No Known  Allergies   Past Medical History:  Diagnosis Date   Cancer ([place])    Cataract    Diabetes mellitus without complication ([place])    Hyperlipidemia    Hypertension    Hypothyroidism    doctor's keeping an eye on thyroid    Lung cancer [facility])      Past Surgical History:  Procedure Laterality Date   ABDOMINAL HYSTERECTOMY     IR CV LINE INJECTION  09/28/2021   PORTA CATH INSERTION N/A 11/30/2020   Procedure: PORTA CATH INSERTION;  Surgeon: Algernon Huxley, MD;  Location: [place] [facility];  Service: Cardiovascular;  Laterality: N/A;   VIDEO BRONCHOSCOPY WITH ENDOBRONCHIAL NAVIGATION N/A 10/21/2020   Procedure: VIDEO BRONCHOSCOPY WITH ENDOBRONCHIAL NAVIGATION;  Surgeon: Ottie Glazier, MD;  Location: [facility] ORS;  Service: Thoracic;  Laterality: N/A;   VIDEO BRONCHOSCOPY WITH ENDOBRONCHIAL ULTRASOUND N/A 10/21/2020   Procedure: VIDEO BRONCHOSCOPY WITH ENDOBRONCHIAL ULTRASOUND;  Surgeon: Ottie Glazier, MD;  Location: [facility] ORS;  Service: Thoracic;  Laterality: N/A;    Social History   Socioeconomic History   Marital status: Single    Spouse name: Not on file   Number of children: Not on file   Years of education: Not on file   Highest education level: Not on file  Occupational History   Not on file  Tobacco Use   Smoking status: Never   Smokeless tobacco: Never  Vaping Use   Vaping Use: Never used  Substance and Sexual Activity   Alcohol use: No   Drug use: No   Sexual activity: Not Currently  Other Topics Concern   Not on file  Social History Narrative   Not on file   Social Determinants of Health   Financial Resource Strain: Not on file  Food Insecurity: Food Insecurity Present   Worried About Running Out of Food in the Last Year: Sometimes true   Arboriculturist in the Last Year: Sometimes true  Transportation Needs: Unmet Transportation Needs   Lack of Transportation (Medical): Yes   Lack of Transportation (Non-Medical): Yes  Physical Activity: Not on file  Stress:  Not on file  Social Connections: Not on file  Intimate Partner Violence: Not on file    Family History  Problem Relation Age of Onset   Cancer Mother        breast   Hypertension Mother    Breast cancer Mother 71   Heart disease Father    Hyperlipidemia Brother    Heart disease Brother      Current Outpatient Medications:    fluticasone (FLONASE) 50 MCG/ACT nasal spray, Place 2 sprays into both nostrils daily., Disp: 16 g, Rfl: 5   furosemide (LASIX) 20 MG tablet, Take 1 tablet (20 mg total) by mouth daily., Disp: 60 tablet, Rfl: 0   lidocaine-prilocaine (EMLA) cream, Apply 1 application topically as needed. Apply small amount to port site at least 1 hour prior to it being accessed, cover with plastic wrap, Disp: 30 g, Rfl: 1   losartan (COZAAR) 25 MG tablet, Take 25 mg by mouth daily., Disp: , Rfl:    oxycodone (ROXICODONE) 30 MG immediate release tablet, Take 1 tablet (30 mg total) by mouth every 4 (four) hours as needed for pain., Disp: 180 tablet,  Rfl: 0   potassium chloride SA (KLOR-CON M) 20 MEQ tablet, Take 2 tablets (40 mEq total) by mouth daily., Disp: 7 tablet, Rfl: 0   LORazepam (ATIVAN) 0.5 MG tablet, Take 1 tablet (0.5 mg total) by mouth every 6 (six) hours as needed (Nausea or vomiting). (Patient not taking: Reported on 09/28/2021), Disp: 30 tablet, Rfl: 0   mirtazapine (REMERON) 15 MG tablet, Take 15 mg by mouth at bedtime. (Patient not taking: Reported on 12/21/2021), Disp: , Rfl:    naloxone (NARCAN) nasal spray 4 mg/0.1 mL, , Disp: , Rfl:    ondansetron (ZOFRAN) 8 MG tablet, Take 1 tablet (8 mg total) by mouth 2 (two) times daily as needed for refractory nausea / vomiting. Start on day 3 after carboplatin chemo. (Patient not taking: Reported on 10/19/2021), Disp: 30 tablet, Rfl: 1   polyethylene glycol (MIRALAX / GLYCOLAX) 17 g packet, Take 17 g by mouth daily. (Patient not taking: Reported on 12/21/2021), Disp: , Rfl:    prochlorperazine (COMPAZINE) 10 MG tablet, Take 1  tablet (10 mg total) by mouth every 6 (six) hours as needed (Nausea or vomiting). (Patient not taking: Reported on 10/19/2021), Disp: 30 tablet, Rfl: 1   traZODone (DESYREL) 50 MG tablet, Take by mouth. (Patient not taking: Reported on 12/21/2021), Disp: , Rfl:  No current facility-administered medications for this visit.  Facility-Administered Medications Ordered in Other Visits:    0.9 %  sodium chloride infusion, , Intravenous, Once, Sindy Guadeloupe, MD   atezolizumab (TECENTRIQ) 1,200 mg in sodium chloride 0.9 % 250 mL chemo infusion, 1,200 mg, Intravenous, Once, Sindy Guadeloupe, MD   heparin lock flush 100 unit/mL, 500 Units, Intracatheter, Once PRN, Sindy Guadeloupe, MD   sodium chloride flush (NS) 0.9 % injection 10 mL, 10 mL, Intravenous, PRN, Sindy Guadeloupe, MD, 10 mL at 12/15/20 0850  Physical exam:  Vitals:   01/11/22 0904  BP: (!) 147/76  Pulse: (!) 53  Resp: 20  Temp: 98.7 F (37.1 C)  SpO2: 100%  Weight: 154 lb 4.8 oz (70 kg)   Physical Exam Constitutional:      General: She is not in acute distress. Cardiovascular:     Rate and Rhythm: Normal rate and regular rhythm.     Heart sounds: Normal heart sounds.  Pulmonary:     Effort: Pulmonary effort is normal.     Breath sounds: Normal breath sounds.  Abdominal:     General: Bowel sounds are normal.     Palpations: Abdomen is soft.  Musculoskeletal:     Comments: Bilateral compression stockings in place  Skin:    General: Skin is warm and dry.  Neurological:     Mental Status: She is alert and oriented to person, place, and time.     CMP Latest Ref Rng & Units 01/11/2022  Glucose 70 - 99 mg/dL 111(H)  BUN 8 - 23 mg/dL 12  Creatinine 0.44 - 1.00 mg/dL 0.70  Sodium 135 - 145 mmol/L 138  Potassium 3.5 - 5.1 mmol/L 3.3(L)  Chloride 98 - 111 mmol/L 104  CO2 22 - 32 mmol/L 27  Calcium 8.9 - 10.3 mg/dL 9.6  Total Protein 6.5 - 8.1 g/dL 7.3  Total Bilirubin 0.3 - 1.2 mg/dL 0.5  Alkaline Phos 38 - 126 U/L 74  AST 15 -  41 U/L 24  ALT 0 - 44 U/L 26   CBC Latest Ref Rng & Units 01/11/2022  WBC 4.0 - 10.5 K/uL 3.1(L)  Hemoglobin 12.0 -  15.0 g/dL 12.7  Hematocrit 36.0 - 46.0 % 39.1  Platelets 150 - 400 K/uL 192    No images are attached to the encounter.  CT CHEST ABDOMEN PELVIS W CONTRAST  Result Date: 12/29/2021 CLINICAL DATA:  Small-cell lung cancer.  Restaging. EXAM: CT CHEST, ABDOMEN, AND PELVIS WITH CONTRAST TECHNIQUE: Multidetector CT imaging of the chest, abdomen and pelvis was performed following the standard protocol during bolus administration of intravenous contrast. RADIATION DOSE REDUCTION: This exam was performed according to the departmental dose-optimization program which includes automated exposure control, adjustment of the mA and/or kV according to patient size and/or use of iterative reconstruction technique. CONTRAST:  40mL OMNIPAQUE IOHEXOL 300 MG/ML  SOLN COMPARISON:  10/01/2021 FINDINGS: CT CHEST FINDINGS Cardiovascular: The heart size is normal. No substantial pericardial effusion. No thoracic aortic aneurysm. Right Port-A-Cath tip is positioned in the upper right atrium. Mediastinum/Nodes: Stable multinodular thyroid enlargement. Amorphous soft tissue lesion in the AP window measures 4.5 x 3.2 cm today (18/2) compared to 4.7 x 4.2 cm previously. 8 mm short axis subcarinal node is within normal limits for size but slightly more conspicuous than prior. There is no hilar lymphadenopathy. The esophagus has normal imaging features. There is no axillary lymphadenopathy. Lungs/Pleura: Interval evolution of left lung suprahilar radiation change with areas that are less confluent today in more pronounced bronchiectasis. 5 mm left lower lobe subpleural nodule on 67/4 is unchanged. 5 mm right lower lobe paraspinal subpleural nodule on 01/08/4 is stable. Several other scattered tiny pulmonary nodules are unchanged. No new suspicious pulmonary nodule or mass. No pleural effusion. Musculoskeletal: No  worrisome lytic or sclerotic osseous abnormality. CT ABDOMEN PELVIS FINDINGS Hepatobiliary: Segment IV hepatic lesion measures 3.1 x 2.6 cm today on image 50/2 compared to 3.5 x 3.1 cm previously. 1.7 cm hypervascular lesion in the medial right liver (46/2) is unchanged. There is no evidence for gallstones, gallbladder wall thickening, or pericholecystic fluid. No intrahepatic or extrahepatic biliary dilation. Pancreas: No focal mass lesion. No dilatation of the main duct. No intraparenchymal cyst. No peripancreatic edema. Spleen: No splenomegaly. No focal mass lesion. Adrenals/Urinary Tract: No adrenal nodule or mass. No evidence for hydroureter. The urinary bladder appears normal for the degree of distention. Stomach/Bowel: Stomach is distended with contrast material. Duodenum is normally positioned as is the ligament of Treitz. No small bowel wall thickening. No small bowel dilatation. The terminal ileum is normal. The appendix is normal. No gross colonic mass. No colonic wall thickening. Large stool volume evident Vascular/Lymphatic: There is mild atherosclerotic calcification of the abdominal aorta without aneurysm. There is no gastrohepatic or hepatoduodenal ligament lymphadenopathy. No retroperitoneal or mesenteric lymphadenopathy. No pelvic sidewall lymphadenopathy. Reproductive: Unremarkable. Other: No intraperitoneal free fluid. Musculoskeletal: No worrisome lytic or sclerotic osseous abnormality. Borderline to mild lymphadenopathy in both groin regions is stable. IMPRESSION: 1. Interval evolution of left suprahilar radiation change with areas that are less confluent today with more pronounced bronchiectasis. 2. Slight interval decrease in size of the AP window soft tissue lesion. No new or progressive findings in the chest, abdomen, or pelvis. 3. Stable appearance of tiny mainly subpleural bilateral pulmonary nodules. Likely benign, continued attention on follow-up recommended. 4. Continued further  decrease in size of the low-density segment IV lesion. 5. Large stool volume. Imaging features could be compatible with constipation. 6. Aortic Atherosclerosis (ICD10-I70.0). Electronically Signed   By: Misty Stanley M.D.   On: 12/29/2021 12:53   VAS [place] LOWER EXTREMITY VENOUS REFLUX  Result Date: 12/16/2021  Lower Venous  Reflux Study Patient Name:  JOANANN MIES [facility]  Date of Exam:   12/15/2021 Medical Rec #: [id number]          Accession #:    [id number] Date of Birth: 1950-12-05           Patient Gender: F Patient Age:   53 years Exam Location:  [facility] Procedure:      VAS [place] LOWER EXTREMITY VENOUS REFLUX Referring Phys: Hortencia Pilar --------------------------------------------------------------------------------  Indications: Swelling in feet.  Performing Technologist: Concha Norway RVT  Examination Guidelines: A complete evaluation includes B-mode imaging, spectral Doppler, color Doppler, and power Doppler as needed of all accessible portions of each vessel. Bilateral testing is considered an integral part of a complete examination. Limited examinations for reoccurring indications may be performed as noted. The reflux portion of the exam is performed with the patient in reverse Trendelenburg. Significant venous reflux is defined as >500 ms in the superficial venous system, and >1 second in the deep venous system.   Summary: Bilateral: - No evidence of deep vein thrombosis seen in the lower extremities, bilaterally, from the common femoral through the popliteal veins. - No evidence of superficial venous thrombosis in the lower extremities, bilaterally. - No evidence of deep venous insufficiency seen bilaterally in the lower extremity. - No evidence of superficial venous reflux seen in the greater saphenous veins bilaterally. - No evidence of superficial venous reflux seen in the short saphenous veins bilaterally.  *See table(s) above for measurements and observations. Electronically signed by  Hortencia Pilar MD on 12/16/2021 at 3:10:30 PM.    Final      Assessment and plan- Patient is a 71 y.o. female  with extensive stage small cell lung cancer with liver metastases.  She is here for cycle 14 of maintenance Tecentriq and review CT scan results and further management  I have reviewed CT chest abdomen and pelvis images independently and discussed findings with the patient.  Overall scan show mild reduction in the size of the primary lung mass as well as the dominant liver metastases.  No evidence of progressive disease.  She has responded well to single agent Tecentriq so far.  Plan is to continue that until progression or toxicity.  She will therefore proceed with cycle 14 of maintenance Tecentriq today and I will see her back in 3 weeks for cycle 15.  Self-limited headaches: I will obtain a repeat MRI brain with and without contrast at this time   Visit Diagnosis 1. Encounter for antineoplastic immunotherapy   2. Small cell carcinoma of lung metastatic to liver [facility])      Dr. Randa Evens, MD, MPH [facility] [phone] 01/11/2022 9:53 AM

## 2022-01-11 NOTE — Addendum Note (Signed)
Addended by: Luella Cook on: 01/11/2022 10:14 AM   Modules accepted: Orders

## 2022-01-24 ENCOUNTER — Ambulatory Visit
Admission: RE | Admit: 2022-01-24 | Discharge: 2022-01-24 | Disposition: A | Discharge status: Home / Self Care | Payer: Medicare HMO | Source: Ambulatory Visit | Attending: Radiation Oncology | Admitting: Radiation Oncology

## 2022-01-24 ENCOUNTER — Other Ambulatory Visit: Payer: Self-pay

## 2022-01-24 VITALS — BP 165/111 | HR 53 | Temp 96.8°F | Resp 16 | Wt 157.3 lb

## 2022-01-24 DIAGNOSIS — C7931 Secondary malignant neoplasm of brain: Secondary | ICD-10-CM | POA: Diagnosis not present

## 2022-01-24 DIAGNOSIS — C787 Secondary malignant neoplasm of liver and intrahepatic bile duct: Secondary | ICD-10-CM | POA: Diagnosis not present

## 2022-01-24 DIAGNOSIS — C3490 Malignant neoplasm of unspecified part of unspecified bronchus or lung: Secondary | ICD-10-CM | POA: Diagnosis not present

## 2022-01-24 DIAGNOSIS — Z923 Personal history of irradiation: Secondary | ICD-10-CM | POA: Insufficient documentation

## 2022-01-24 DIAGNOSIS — Z79899 Other long term (current) drug therapy: Secondary | ICD-10-CM | POA: Insufficient documentation

## 2022-01-24 DIAGNOSIS — C771 Secondary and unspecified malignant neoplasm of intrathoracic lymph nodes: Secondary | ICD-10-CM | POA: Diagnosis present

## 2022-01-24 NOTE — Progress Notes (Signed)
Radiation Oncology ?Follow up Note ? ?Name: Doris Johnson   ?Date:   01/24/2022 ?MRN:  [id number] ?DOB: [birth year]  ? ? ?This 71 y.o. female presents to the clinic today for 1 year follow-up status post palliative ration therapy to her mediastinum in a patient with known stage IV small cell lung cancer with both liver and brain metastasis. ? ?REFERRING PROVIDER: Leonel Ramsay, MD ? ?HPI: Patient is a 71 year old female now at 1 year having completed palliative radiation therapy to her chest for extensive stage small cell lung cancer with both liver and brain metastasis.  She is seen today 1 year out is doing well..  She is currently on maintenance Tecentriq.  She specifically [place] cough hemoptysis or chest tightness.  Recent CT scans of her chest showed interval evolution of left suprahilar radiation changes less confluent today.  She also has slight decrease in the AP window soft tissue lesions.  No progressive findings in chest abdomen or pelvis.  She also has a continued decrease in low-density segment 4 lesion bone scan back in November showed no evidence to suggest metastatic disease to her bone.  She has an MRI of her brain tomorrow she has been having some mild headaches. ? ?COMPLICATIONS OF TREATMENT: none ? ?FOLLOW UP COMPLIANCE: keeps appointments  ? ?PHYSICAL EXAM:  ?BP (!) 165/111 (BP Location: Left Arm, Patient Position: Sitting)   Pulse (!) 53   Temp (!) 96.8 ?F (36 ?C) (Tympanic)   Resp 16   Wt 157 lb 4.8 oz (71.4 kg)   BMI 25.39 kg/m?  ?Well-developed well-nourished patient in NAD. HEENT reveals PERLA, EOMI, discs not visualized.  Oral cavity is clear. No oral mucosal lesions are identified. Neck is clear without evidence of cervical or supraclavicular adenopathy. Lungs are clear to A&P. Cardiac examination is essentially unremarkable with regular rate and rhythm without murmur rub or thrill. Abdomen is benign with no organomegaly or masses noted. Motor sensory and DTR levels are equal and  symmetric in the upper and lower extremities. Cranial nerves II through XII are grossly intact. Proprioception is intact. No peripheral adenopathy or edema is identified. No motor or sensory levels are noted. Crude visual fields are within normal range.   ? ?RADIOLOGY RESULTS: CT scans of chest abdomen pelvis reviewed bone scan reviewed MRI will be reviewed after is available tomorrow ? ?PLAN: Present time patient is doing well on maintenance Tecentriq.  Should she have brain lesions we will discuss that with medical oncology.  I have otherwise asked to see her back in 6 months for follow-up.  We have to reevaluate the patient in time should further palliative treatment be indicated. ? ?I would like to take this opportunity to thank you for allowing me to participate in the care of your patient.. ?  ? Noreene Filbert, MD ? ?

## 2022-01-25 ENCOUNTER — Ambulatory Visit
Admission: RE | Admit: 2022-01-25 | Discharge: 2022-01-25 | Disposition: A | Discharge status: Home / Self Care | Payer: Medicare HMO | Source: Ambulatory Visit | Attending: Oncology | Admitting: Oncology

## 2022-01-25 DIAGNOSIS — C3490 Malignant neoplasm of unspecified part of unspecified bronchus or lung: Secondary | ICD-10-CM | POA: Diagnosis present

## 2022-01-25 DIAGNOSIS — G9389 Other specified disorders of brain: Secondary | ICD-10-CM | POA: Insufficient documentation

## 2022-01-25 DIAGNOSIS — C787 Secondary malignant neoplasm of liver and intrahepatic bile duct: Secondary | ICD-10-CM | POA: Diagnosis present

## 2022-01-25 MED ORDER — GADOBUTROL 1 MMOL/ML IV SOLN
7.0000 mL | Freq: Once | INTRAVENOUS | Status: AC | PRN
  Administered 2022-01-25: 7 mL via INTRAVENOUS

## 2022-01-26 ENCOUNTER — Encounter: Payer: Self-pay | Admitting: *Deleted

## 2022-01-26 NOTE — Progress Notes (Signed)
Per Dr. Jacinto Reap, pt has numerous brain lesions and will need to see Dr. Baruch Gouty ASAP to discuss brain radiation. Appt scheduled with Dr. Baruch Gouty on 3/16 at [facility] [facility]. Phone call placed to patient to review brian MRI results and review upcoming appt with Dr. Baruch Gouty. All questions answered during call. Nothing further needed at this time.  ?

## 2022-01-27 ENCOUNTER — Ambulatory Visit
Admission: RE | Admit: 2022-01-27 | Discharge: 2022-01-27 | Disposition: A | Discharge status: Home / Self Care | Payer: Medicare HMO | Source: Ambulatory Visit | Attending: Radiation Oncology | Admitting: Radiation Oncology

## 2022-01-27 ENCOUNTER — Other Ambulatory Visit: Payer: Self-pay

## 2022-01-27 ENCOUNTER — Other Ambulatory Visit: Payer: Self-pay | Admitting: *Deleted

## 2022-01-27 ENCOUNTER — Other Ambulatory Visit: Payer: Self-pay | Admitting: Radiation Oncology

## 2022-01-27 ENCOUNTER — Encounter: Payer: Self-pay | Admitting: Radiation Oncology

## 2022-01-27 VITALS — BP 167/70 | HR 55 | Temp 97.3°F | Resp 16 | Ht 66.0 in | Wt 157.1 lb

## 2022-01-27 DIAGNOSIS — C787 Secondary malignant neoplasm of liver and intrahepatic bile duct: Secondary | ICD-10-CM | POA: Diagnosis not present

## 2022-01-27 DIAGNOSIS — C7931 Secondary malignant neoplasm of brain: Secondary | ICD-10-CM | POA: Diagnosis not present

## 2022-01-27 DIAGNOSIS — C3490 Malignant neoplasm of unspecified part of unspecified bronchus or lung: Secondary | ICD-10-CM | POA: Insufficient documentation

## 2022-01-27 DIAGNOSIS — Z923 Personal history of irradiation: Secondary | ICD-10-CM | POA: Diagnosis not present

## 2022-01-27 MED ORDER — LANSOPRAZOLE 30 MG PO CPDR: 30.0000 mg | DELAYED_RELEASE_CAPSULE | Freq: Every day | ORAL | 1 refills | Status: DC

## 2022-01-27 MED ORDER — DEXAMETHASONE 4 MG PO TABS: 4.0000 mg | ORAL_TABLET | Freq: Two times a day (BID) | ORAL | 1 refills | Status: DC

## 2022-01-27 NOTE — Progress Notes (Signed)
Radiation Oncology ?Follow up Note old patient new area brain mets ? ?Name: Doris Johnson   ?Date:   01/27/2022 ?MRN:  [id number] ?DOB: May 05, 1951  ? ? ?This 71 y.o. female presents to the clinic today for evaluation of multiple brain mets and patient with known stage IV small cell lung cancer previously treated with palliative radiation therapy to her chest. ? ?REFERRING PROVIDER: Leonel Ramsay, MD ? ?HPI: Patient is a 72 year old female now out over a year having completed palliative radiation therapy to her chest for extensive stage small cell lung cancer with both liver and brain metastasis.  She had been on maintenance Tecentriq although recently presented with headaches..  Brain MRI showed definitive evidence of metastatic disease with approximately 16 small brain lesions ranging from punctate to 5 to 6 mm.  Recent CT scan of her chest showed interval evolution of left suprahilar radiation changes with slight decrease in the size of the AP window soft tissue lesions.  She is having no other focal neurologic deficits no change in visual fields.  She is seen today for consideration of palliative whole brain radiation ? ?COMPLICATIONS OF TREATMENT: none ? ?FOLLOW UP COMPLIANCE: keeps appointments  ? ?PHYSICAL EXAM:  ?BP (!) 167/70 (BP Location: Right Arm, Patient Position: Sitting)   Pulse (!) 55   Temp (!) 97.3 ?F (36.3 ?C) (Tympanic)   Resp 16   Ht 5\' 6"  (1.676 m)   Wt 157 lb 1.6 oz (71.3 kg)   BMI 25.36 kg/m?  ?Crude visual fields are within normal range.  Motor or sensory in detail levels are equal and symmetric in the upper lower extremities.  Well-developed well-nourished patient in NAD. HEENT reveals PERLA, EOMI, discs not visualized.  Oral cavity is clear. No oral mucosal lesions are identified. Neck is clear without evidence of cervical or supraclavicular adenopathy. Lungs are clear to A&P. Cardiac examination is essentially unremarkable with regular rate and rhythm without murmur rub or  thrill. Abdomen is benign with no organomegaly or masses noted. Motor sensory and DTR levels are equal and symmetric in the upper and lower extremities. Cranial nerves II through XII are grossly intact. Proprioception is intact. No peripheral adenopathy or edema is identified. No motor or sensory levels are noted. Crude visual fields are within normal range. ? ?RADIOLOGY RESULTS: MRI scan and CT scans reviewed compatible with above-stated findings ? ?PLAN: At this time I have recommended whole brain radiation therapy.  Would plan on delivering 30 Gray in 10 fractions.  Risks and benefits of treatment including hair loss possible skin reaction fatigue all were described in detail to the patient.  She seems to comprehend my treatment plan well.  I have personally set up and ordered CT simulation for early next week.  I am also starting on 4 mg of Decadron twice a day.  Also will cover her with Prevacid to protect her gastric mucosa.  Patient and daughter both comprehend my treatment plan well. ? ?I would like to take this opportunity to thank you for allowing me to participate in the care of your patient.. ?  ? Noreene Filbert, MD ? ?

## 2022-01-31 ENCOUNTER — Ambulatory Visit
Admission: RE | Admit: 2022-01-31 | Discharge: 2022-01-31 | Disposition: A | Discharge status: Home / Self Care | Payer: Medicare HMO | Source: Ambulatory Visit | Attending: Radiation Oncology | Admitting: Radiation Oncology

## 2022-01-31 ENCOUNTER — Other Ambulatory Visit: Payer: Self-pay | Admitting: *Deleted

## 2022-01-31 DIAGNOSIS — C3412 Malignant neoplasm of upper lobe, left bronchus or lung: Secondary | ICD-10-CM | POA: Insufficient documentation

## 2022-01-31 DIAGNOSIS — C787 Secondary malignant neoplasm of liver and intrahepatic bile duct: Secondary | ICD-10-CM | POA: Diagnosis not present

## 2022-01-31 DIAGNOSIS — C7931 Secondary malignant neoplasm of brain: Secondary | ICD-10-CM | POA: Diagnosis present

## 2022-01-31 DIAGNOSIS — C3490 Malignant neoplasm of unspecified part of unspecified bronchus or lung: Secondary | ICD-10-CM

## 2022-01-31 DIAGNOSIS — Z510 Encounter for antineoplastic radiation therapy: Secondary | ICD-10-CM | POA: Diagnosis present

## 2022-02-01 ENCOUNTER — Institutional Professional Consult (permissible substitution): Payer: Medicare HMO | Admitting: Radiation Oncology

## 2022-02-01 ENCOUNTER — Other Ambulatory Visit: Payer: Self-pay

## 2022-02-01 ENCOUNTER — Inpatient Hospital Stay: Payer: Medicare HMO | Attending: Oncology

## 2022-02-01 ENCOUNTER — Inpatient Hospital Stay (HOSPITAL_BASED_OUTPATIENT_CLINIC_OR_DEPARTMENT_OTHER): Payer: Medicare HMO | Admitting: Oncology

## 2022-02-01 ENCOUNTER — Inpatient Hospital Stay: Payer: Medicare HMO

## 2022-02-01 ENCOUNTER — Encounter: Payer: Self-pay | Admitting: Oncology

## 2022-02-01 VITALS — BP 181/91 | HR 61 | Temp 98.7°F | Resp 20 | Ht 66.0 in | Wt 157.0 lb

## 2022-02-01 DIAGNOSIS — I10 Essential (primary) hypertension: Secondary | ICD-10-CM | POA: Insufficient documentation

## 2022-02-01 DIAGNOSIS — R5383 Other fatigue: Secondary | ICD-10-CM | POA: Diagnosis not present

## 2022-02-01 DIAGNOSIS — C3412 Malignant neoplasm of upper lobe, left bronchus or lung: Secondary | ICD-10-CM | POA: Insufficient documentation

## 2022-02-01 DIAGNOSIS — Z8349 Family history of other endocrine, nutritional and metabolic diseases: Secondary | ICD-10-CM | POA: Diagnosis not present

## 2022-02-01 DIAGNOSIS — C3490 Malignant neoplasm of unspecified part of unspecified bronchus or lung: Secondary | ICD-10-CM

## 2022-02-01 DIAGNOSIS — Z803 Family history of malignant neoplasm of breast: Secondary | ICD-10-CM | POA: Diagnosis not present

## 2022-02-01 DIAGNOSIS — E119 Type 2 diabetes mellitus without complications: Secondary | ICD-10-CM | POA: Insufficient documentation

## 2022-02-01 DIAGNOSIS — Z8249 Family history of ischemic heart disease and other diseases of the circulatory system: Secondary | ICD-10-CM | POA: Insufficient documentation

## 2022-02-01 DIAGNOSIS — C787 Secondary malignant neoplasm of liver and intrahepatic bile duct: Secondary | ICD-10-CM

## 2022-02-01 DIAGNOSIS — C7931 Secondary malignant neoplasm of brain: Secondary | ICD-10-CM

## 2022-02-01 DIAGNOSIS — Z79899 Other long term (current) drug therapy: Secondary | ICD-10-CM | POA: Insufficient documentation

## 2022-02-01 DIAGNOSIS — Z7189 Other specified counseling: Secondary | ICD-10-CM | POA: Diagnosis not present

## 2022-02-01 LAB — CBC WITH DIFFERENTIAL/PLATELET
Abs Immature Granulocytes: 0.02 10*3/uL (ref 0.00–0.07)
Basophils Absolute: 0 10*3/uL (ref 0.0–0.1)
Basophils Relative: 0 %
Eosinophils Absolute: 0 10*3/uL (ref 0.0–0.5)
Eosinophils Relative: 0 %
HCT: 38.6 % (ref 36.0–46.0)
Hemoglobin: 12.6 g/dL (ref 12.0–15.0)
Immature Granulocytes: 0 %
Lymphocytes Relative: 15 %
Lymphs Abs: 1.1 10*3/uL (ref 0.7–4.0)
MCH: 28.3 pg (ref 26.0–34.0)
MCHC: 32.6 g/dL (ref 30.0–36.0)
MCV: 86.5 fL (ref 80.0–100.0)
Monocytes Absolute: 0.5 10*3/uL (ref 0.1–1.0)
Monocytes Relative: 7 %
Neutro Abs: 5.4 10*3/uL (ref 1.7–7.7)
Neutrophils Relative %: 78 %
Platelets: 223 10*3/uL (ref 150–400)
RBC: 4.46 MIL/uL (ref 3.87–5.11)
RDW: 14.6 % (ref 11.5–15.5)
WBC: 7 10*3/uL (ref 4.0–10.5)
nRBC: 0 % (ref 0.0–0.2)

## 2022-02-01 LAB — COMPREHENSIVE METABOLIC PANEL
ALT: 28 U/L (ref 0–44)
AST: 34 U/L (ref 15–41)
Albumin: 4 g/dL (ref 3.5–5.0)
Alkaline Phosphatase: 70 U/L (ref 38–126)
Anion gap: 6 (ref 5–15)
BUN: 16 mg/dL (ref 8–23)
CO2: 26 mmol/L (ref 22–32)
Calcium: 9.8 mg/dL (ref 8.9–10.3)
Chloride: 105 mmol/L (ref 98–111)
Creatinine, Ser: 0.69 mg/dL (ref 0.44–1.00)
GFR, Estimated: >60 mL/min (ref >60)
Glucose, Bld: 123 mg/dL — ABNORMAL HIGH (ref 70–99)
Potassium: 3.4 mmol/L — ABNORMAL LOW (ref 3.5–5.1)
Sodium: 137 mmol/L (ref 135–145)
Total Bilirubin: 0.4 mg/dL (ref 0.3–1.2)
Total Protein: 7.4 g/dL (ref 6.5–8.1)

## 2022-02-01 LAB — TSH: TSH: 0.213 u[IU]/mL — ABNORMAL LOW (ref 0.350–4.500)

## 2022-02-01 MED ORDER — HEPARIN SOD (PORK) LOCK FLUSH 100 UNIT/ML IV SOLN
500.0000 [IU] | Freq: Once | INTRAVENOUS | Status: AC
  Administered 2022-02-01: 500 [IU] via INTRAVENOUS
  Filled 2022-02-01: qty 5

## 2022-02-01 MED ORDER — SODIUM CHLORIDE 0.9% FLUSH
10.0000 mL | INTRAVENOUS | Status: DC | PRN
  Administered 2022-02-01: 10 mL via INTRAVENOUS
  Filled 2022-02-01: qty 10

## 2022-02-01 NOTE — Progress Notes (Signed)
Patient states food don't taste the same. ?

## 2022-02-01 NOTE — Progress Notes (Signed)
? ? ? ?Hematology/Oncology Consult note ?[place]  ?[phone]) [phone] [phone] ? ?Patient Care Team: ?Leonel Ramsay, MD as PCP - General (Infectious Diseases) ?Telford Nab, RN as Sales executive ?Sindy Guadeloupe, MD as Consulting Physician (Hematology and Oncology)  ? ?Name of the patient: Doris Johnson  ?[id number]  ?[birth year]  ? ?Date of visit: 02/01/22 ? ?Diagnosis- extensive stage small cell lung cancer with liver metastases ?  ? ?Chief complaint/ Reason for visit-on treatment assessment prior to cycle 15 of maintenance Tecentriq ? ?Heme/Onc history: Patient is a 71 year old female with a remote history of smoking in her teenage years and exposure to passive smoking.  She has been having ongoing midsternal pain which has been growing since the last 6 to 7 months.  She had been to urgent care as well in the past.  She finally had a CT chest with contrast done in October 2021 which showed a large mass in the left side of the mediastinum measuring 6.7 x 6.2 x 6.1 cm causing severe compression of the left main pulmonary artery and around the aortic arch in the left subclavian artery.  Enlarged thyroid gland.  Left upper lobe nodule 1 x 0.7 cm.  She then underwent bronchoscopy with Dr.Aleskerov left upper lobe biopsy was nondiagnostic.  However station 4, 7 and 10 L lymph nodes were consistent with metastatic small cell carcinoma. ?  ?PET CT scan showed enlarged level 3 cervical lymph node 2.2 cm that was hypermetabolic with an SUV of 9.7.  Large central 7.3 cm AP window mass.  5.7 cm left liver mass.  MRI brain with and without contrast showed 2 small foci of enhancement in the right cerebellar hemisphere and right temporal lobe without mass-effect or edema as well concerning for brain metastases ?  ?Patient had 2 cycles of carbo etoposide chemotherapy along with Tecentriq and subsequent scan showed no significant improvement in the primary lung mass or liver  mass.  She received radiation treatment to her primary lung mass and also seen by [facility] for second opinion.  Her pathology was reviewed at [facility] and reported as possible neuroendocrine neoplasm But stated that small cell carcinoma cannot be excluded but not definitely diagnostic for it.  However [facility] pathology feels that this is consistent with small cell lung cancer.  She has completed 4 cycles of carbo etoposide Tecentriq chemotherapy and is currently on maintenance Tecentriq.  Repeat liver biopsy was also performed and was consistent with small cell lung cancer ?  ?Patient has had stable disease on maintenance Tecentriq so far and she has been on single agent Tecentriq since April 2022 ?  ? ?Interval history-she is currently on Decadron and reports that her headaches are mild and intermittent.  She will be starting whole brain radiation treatment next week. ? ?ECOG PS- 1 ?Pain scale- 0 ? ?Review of systems- Review of Systems  ?Constitutional:  Positive for malaise/fatigue. Negative for chills, fever and weight loss.  ?HENT:  Negative for congestion, ear discharge and nosebleeds.   ?Eyes:  Negative for blurred vision.  ?Respiratory:  Negative for cough, hemoptysis, sputum production, shortness of breath and wheezing.   ?Cardiovascular:  Negative for chest pain, palpitations, orthopnea and claudication.  ?Gastrointestinal:  Negative for abdominal pain, blood in stool, constipation, diarrhea, heartburn, melena, nausea and vomiting.  ?Genitourinary:  Negative for dysuria, flank pain, frequency, hematuria and urgency.  ?Musculoskeletal:  Negative for back pain, joint pain and myalgias.  ?Skin:  Negative for rash.  ?Neurological:  Negative for dizziness, tingling, focal weakness, seizures, weakness and headaches.  ?Endo/Heme/Allergies:  Does not bruise/bleed easily.  ?Psychiatric/Behavioral:  Negative for depression and suicidal ideas. The patient does not have insomnia.    ? ? ? ?No Known Allergies ? ? ?Past Medical  History:  ?Diagnosis Date  ? Cancer [facility])   ? Cataract   ? Diabetes mellitus without complication ([place])   ? Hyperlipidemia   ? Hypertension   ? Hypothyroidism   ? doctor's keeping an eye on thyroid   ? Lung cancer ([place])   ? ? ? ?Past Surgical History:  ?Procedure Laterality Date  ? ABDOMINAL HYSTERECTOMY    ? IR CV LINE INJECTION  09/28/2021  ? PORTA CATH INSERTION N/A 11/30/2020  ? Procedure: PORTA CATH INSERTION;  Surgeon: Algernon Huxley, MD;  Location: [place] [facility];  Service: Cardiovascular;  Laterality: N/A;  ? VIDEO BRONCHOSCOPY WITH ENDOBRONCHIAL NAVIGATION N/A 10/21/2020  ? Procedure: VIDEO BRONCHOSCOPY WITH ENDOBRONCHIAL NAVIGATION;  Surgeon: Ottie Glazier, MD;  Location: [facility];  Service: Thoracic;  Laterality: N/A;  ? VIDEO BRONCHOSCOPY WITH ENDOBRONCHIAL ULTRASOUND N/A 10/21/2020  ? Procedure: VIDEO BRONCHOSCOPY WITH ENDOBRONCHIAL ULTRASOUND;  Surgeon: Ottie Glazier, MD;  Location: [facility];  Service: Thoracic;  Laterality: N/A;  ? ? ?Social History  ? ?Socioeconomic History  ? Marital status: Single  ?  Spouse name: Not on file  ? Number of children: Not on file  ? Years of education: Not on file  ? Highest education level: Not on file  ?Occupational History  ? Not on file  ?Tobacco Use  ? Smoking status: Never  ? Smokeless tobacco: Never  ?Vaping Use  ? Vaping Use: Never used  ?Substance and Sexual Activity  ? Alcohol use: No  ? Drug use: No  ? Sexual activity: Not Currently  ?Other Topics Concern  ? Not on file  ?Social History Narrative  ? Not on file  ? ?Social Determinants of Health  ? ?Financial Resource Strain: Not on file  ?Food Insecurity: Food Insecurity Present  ? Worried About Charity fundraiser in the Last Year: Sometimes true  ? Ran Out of Food in the Last Year: Sometimes true  ?Transportation Needs: Unmet Transportation Needs  ? Lack of Transportation (Medical): Yes  ? Lack of Transportation (Non-Medical): Yes  ?Physical Activity: Not on file  ?Stress: Not on file  ?Social  Connections: Not on file  ?Intimate Partner Violence: Not on file  ? ? ?Family History  ?Problem Relation Age of Onset  ? Cancer Mother   ?     breast  ? Hypertension Mother   ? Breast cancer Mother 6  ? Heart disease Father   ? Hyperlipidemia Brother   ? Heart disease Brother   ? ? ? ?Current Outpatient Medications:  ?  dexamethasone (DECADRON) 4 MG tablet, Take 1 tablet (4 mg total) by mouth 2 (two) times daily with a meal., Disp: 60 tablet, Rfl: 1 ?  fluticasone (FLONASE) 50 MCG/ACT nasal spray, Place 2 sprays into both nostrils daily., Disp: 16 g, Rfl: 5 ?  furosemide (LASIX) 20 MG tablet, Take 1 tablet (20 mg total) by mouth daily., Disp: 60 tablet, Rfl: 0 ?  lansoprazole (PREVACID) 30 MG capsule, Take 1 capsule (30 mg total) by mouth daily., Disp: 30 capsule, Rfl: 1 ?  lidocaine-prilocaine (EMLA) cream, Apply 1 application topically as needed. Apply small amount to port site at least 1 hour prior to it being accessed, cover with plastic wrap, Disp: 30  g, Rfl: 1 ?  losartan (COZAAR) 25 MG tablet, Take 25 mg by mouth daily., Disp: , Rfl:  ?  oxycodone (ROXICODONE) 30 MG immediate release tablet, Take 1 tablet (30 mg total) by mouth every 4 (four) hours as needed for pain., Disp: 180 tablet, Rfl: 0 ?  polyethylene glycol (MIRALAX / GLYCOLAX) 17 g packet, Take 17 g by mouth daily., Disp: , Rfl:  ?  potassium chloride SA (KLOR-CON M) 20 MEQ tablet, Take 2 tablets (40 mEq total) by mouth daily., Disp: 7 tablet, Rfl: 0 ?  LORazepam (ATIVAN) 0.5 MG tablet, Take 1 tablet (0.5 mg total) by mouth every 6 (six) hours as needed (Nausea or vomiting). (Patient not taking: Reported on 09/28/2021), Disp: 30 tablet, Rfl: 0 ?  mirtazapine (REMERON) 15 MG tablet, Take 15 mg by mouth at bedtime. (Patient not taking: Reported on 12/21/2021), Disp: , Rfl:  ?  naloxone (NARCAN) nasal spray 4 mg/0.1 mL, , Disp: , Rfl:  ?  ondansetron (ZOFRAN) 8 MG tablet, Take 1 tablet (8 mg total) by mouth 2 (two) times daily as needed for refractory  nausea / vomiting. Start on day 3 after carboplatin chemo. (Patient not taking: Reported on 10/19/2021), Disp: 30 tablet, Rfl: 1 ?  prochlorperazine (COMPAZINE) 10 MG tablet, Take 1 tablet (10 mg total) by mout

## 2022-02-02 DIAGNOSIS — C7931 Secondary malignant neoplasm of brain: Secondary | ICD-10-CM | POA: Diagnosis not present

## 2022-02-03 ENCOUNTER — Ambulatory Visit: Admission: RE | Admit: 2022-02-03 | Payer: Medicare HMO | Source: Ambulatory Visit

## 2022-02-03 DIAGNOSIS — C7931 Secondary malignant neoplasm of brain: Secondary | ICD-10-CM | POA: Diagnosis not present

## 2022-02-07 ENCOUNTER — Other Ambulatory Visit: Payer: Self-pay

## 2022-02-07 ENCOUNTER — Inpatient Hospital Stay: Payer: Medicare HMO

## 2022-02-07 ENCOUNTER — Ambulatory Visit
Admission: RE | Admit: 2022-02-07 | Discharge: 2022-02-07 | Disposition: A | Discharge status: Home / Self Care | Payer: Medicare HMO | Source: Ambulatory Visit | Attending: Radiation Oncology | Admitting: Radiation Oncology

## 2022-02-07 DIAGNOSIS — C7931 Secondary malignant neoplasm of brain: Secondary | ICD-10-CM | POA: Diagnosis not present

## 2022-02-08 ENCOUNTER — Ambulatory Visit
Admission: RE | Admit: 2022-02-08 | Discharge: 2022-02-08 | Disposition: A | Discharge status: Home / Self Care | Payer: Medicare HMO | Source: Ambulatory Visit | Attending: Radiation Oncology | Admitting: Radiation Oncology

## 2022-02-08 ENCOUNTER — Inpatient Hospital Stay: Payer: Medicare HMO

## 2022-02-08 DIAGNOSIS — C7931 Secondary malignant neoplasm of brain: Secondary | ICD-10-CM | POA: Diagnosis not present

## 2022-02-09 ENCOUNTER — Ambulatory Visit
Admission: RE | Admit: 2022-02-09 | Discharge: 2022-02-09 | Disposition: A | Discharge status: Home / Self Care | Payer: Medicare HMO | Source: Ambulatory Visit | Attending: Radiation Oncology | Admitting: Radiation Oncology

## 2022-02-09 ENCOUNTER — Inpatient Hospital Stay: Payer: Medicare HMO

## 2022-02-09 DIAGNOSIS — C7931 Secondary malignant neoplasm of brain: Secondary | ICD-10-CM | POA: Diagnosis not present

## 2022-02-10 ENCOUNTER — Inpatient Hospital Stay: Payer: Medicare HMO

## 2022-02-10 ENCOUNTER — Ambulatory Visit
Admission: RE | Admit: 2022-02-10 | Discharge: 2022-02-10 | Disposition: A | Discharge status: Home / Self Care | Payer: Medicare HMO | Source: Ambulatory Visit | Attending: Radiation Oncology | Admitting: Radiation Oncology

## 2022-02-10 DIAGNOSIS — C7931 Secondary malignant neoplasm of brain: Secondary | ICD-10-CM | POA: Diagnosis not present

## 2022-02-11 ENCOUNTER — Inpatient Hospital Stay: Payer: Medicare HMO

## 2022-02-11 ENCOUNTER — Ambulatory Visit
Admission: RE | Admit: 2022-02-11 | Discharge: 2022-02-11 | Disposition: A | Discharge status: Home / Self Care | Payer: Medicare HMO | Source: Ambulatory Visit | Attending: Radiation Oncology | Admitting: Radiation Oncology

## 2022-02-11 DIAGNOSIS — C7931 Secondary malignant neoplasm of brain: Secondary | ICD-10-CM | POA: Diagnosis not present

## 2022-02-14 ENCOUNTER — Ambulatory Visit
Admission: RE | Admit: 2022-02-14 | Discharge: 2022-02-14 | Disposition: A | Discharge status: Home / Self Care | Payer: Medicare HMO | Source: Ambulatory Visit | Attending: Radiation Oncology | Admitting: Radiation Oncology

## 2022-02-14 ENCOUNTER — Inpatient Hospital Stay: Payer: Medicare HMO | Attending: Oncology

## 2022-02-14 DIAGNOSIS — R5383 Other fatigue: Secondary | ICD-10-CM | POA: Insufficient documentation

## 2022-02-14 DIAGNOSIS — Z87891 Personal history of nicotine dependence: Secondary | ICD-10-CM | POA: Insufficient documentation

## 2022-02-14 DIAGNOSIS — C787 Secondary malignant neoplasm of liver and intrahepatic bile duct: Secondary | ICD-10-CM | POA: Insufficient documentation

## 2022-02-14 DIAGNOSIS — E119 Type 2 diabetes mellitus without complications: Secondary | ICD-10-CM | POA: Insufficient documentation

## 2022-02-14 DIAGNOSIS — Z803 Family history of malignant neoplasm of breast: Secondary | ICD-10-CM | POA: Insufficient documentation

## 2022-02-14 DIAGNOSIS — Z5982 Transportation insecurity: Secondary | ICD-10-CM | POA: Insufficient documentation

## 2022-02-14 DIAGNOSIS — Z8249 Family history of ischemic heart disease and other diseases of the circulatory system: Secondary | ICD-10-CM | POA: Insufficient documentation

## 2022-02-14 DIAGNOSIS — G893 Neoplasm related pain (acute) (chronic): Secondary | ICD-10-CM | POA: Insufficient documentation

## 2022-02-14 DIAGNOSIS — E049 Nontoxic goiter, unspecified: Secondary | ICD-10-CM | POA: Insufficient documentation

## 2022-02-14 DIAGNOSIS — C3412 Malignant neoplasm of upper lobe, left bronchus or lung: Secondary | ICD-10-CM | POA: Insufficient documentation

## 2022-02-14 DIAGNOSIS — Z8349 Family history of other endocrine, nutritional and metabolic diseases: Secondary | ICD-10-CM | POA: Insufficient documentation

## 2022-02-14 DIAGNOSIS — C7931 Secondary malignant neoplasm of brain: Secondary | ICD-10-CM | POA: Insufficient documentation

## 2022-02-14 DIAGNOSIS — R590 Localized enlarged lymph nodes: Secondary | ICD-10-CM | POA: Insufficient documentation

## 2022-02-14 DIAGNOSIS — Z923 Personal history of irradiation: Secondary | ICD-10-CM | POA: Insufficient documentation

## 2022-02-14 DIAGNOSIS — Z510 Encounter for antineoplastic radiation therapy: Secondary | ICD-10-CM | POA: Insufficient documentation

## 2022-02-14 DIAGNOSIS — R160 Hepatomegaly, not elsewhere classified: Secondary | ICD-10-CM | POA: Insufficient documentation

## 2022-02-14 DIAGNOSIS — Z79899 Other long term (current) drug therapy: Secondary | ICD-10-CM | POA: Insufficient documentation

## 2022-02-15 ENCOUNTER — Other Ambulatory Visit: Payer: Self-pay | Admitting: *Deleted

## 2022-02-15 ENCOUNTER — Inpatient Hospital Stay: Payer: Medicare HMO

## 2022-02-15 ENCOUNTER — Ambulatory Visit
Admission: RE | Admit: 2022-02-15 | Discharge: 2022-02-15 | Disposition: A | Discharge status: Home / Self Care | Payer: Medicare HMO | Source: Ambulatory Visit | Attending: Radiation Oncology | Admitting: Radiation Oncology

## 2022-02-15 DIAGNOSIS — C7931 Secondary malignant neoplasm of brain: Secondary | ICD-10-CM | POA: Diagnosis not present

## 2022-02-15 MED ORDER — FLUCONAZOLE 100 MG PO TABS: 100.0000 mg | ORAL_TABLET | Freq: Every day | ORAL | 0 refills | Status: DC

## 2022-02-16 ENCOUNTER — Inpatient Hospital Stay: Payer: Medicare HMO

## 2022-02-16 ENCOUNTER — Ambulatory Visit
Admission: RE | Admit: 2022-02-16 | Discharge: 2022-02-16 | Disposition: A | Discharge status: Home / Self Care | Payer: Medicare HMO | Source: Ambulatory Visit | Attending: Radiation Oncology | Admitting: Radiation Oncology

## 2022-02-16 DIAGNOSIS — C7931 Secondary malignant neoplasm of brain: Secondary | ICD-10-CM | POA: Diagnosis not present

## 2022-02-17 ENCOUNTER — Ambulatory Visit
Admission: RE | Admit: 2022-02-17 | Discharge: 2022-02-17 | Disposition: A | Discharge status: Home / Self Care | Payer: Medicare HMO | Source: Ambulatory Visit | Attending: Radiation Oncology | Admitting: Radiation Oncology

## 2022-02-17 ENCOUNTER — Inpatient Hospital Stay: Payer: Medicare HMO

## 2022-02-17 DIAGNOSIS — C7931 Secondary malignant neoplasm of brain: Secondary | ICD-10-CM | POA: Diagnosis not present

## 2022-02-18 ENCOUNTER — Inpatient Hospital Stay: Payer: Medicare HMO

## 2022-02-18 ENCOUNTER — Ambulatory Visit
Admission: RE | Admit: 2022-02-18 | Discharge: 2022-02-18 | Disposition: A | Discharge status: Home / Self Care | Payer: Medicare HMO | Source: Ambulatory Visit | Attending: Radiation Oncology | Admitting: Radiation Oncology

## 2022-02-18 DIAGNOSIS — C7931 Secondary malignant neoplasm of brain: Secondary | ICD-10-CM | POA: Diagnosis not present

## 2022-02-22 ENCOUNTER — Inpatient Hospital Stay (HOSPITAL_BASED_OUTPATIENT_CLINIC_OR_DEPARTMENT_OTHER): Payer: Medicare HMO | Admitting: Oncology

## 2022-02-22 ENCOUNTER — Inpatient Hospital Stay: Payer: Medicare HMO

## 2022-02-22 ENCOUNTER — Encounter: Payer: Self-pay | Admitting: Oncology

## 2022-02-22 ENCOUNTER — Other Ambulatory Visit: Payer: Self-pay

## 2022-02-22 VITALS — BP 148/74 | HR 50 | Temp 97.4°F | Resp 16 | Wt 163.5 lb

## 2022-02-22 DIAGNOSIS — C3490 Malignant neoplasm of unspecified part of unspecified bronchus or lung: Secondary | ICD-10-CM | POA: Diagnosis not present

## 2022-02-22 DIAGNOSIS — Z8349 Family history of other endocrine, nutritional and metabolic diseases: Secondary | ICD-10-CM | POA: Diagnosis not present

## 2022-02-22 DIAGNOSIS — C3412 Malignant neoplasm of upper lobe, left bronchus or lung: Secondary | ICD-10-CM | POA: Insufficient documentation

## 2022-02-22 DIAGNOSIS — Z79899 Other long term (current) drug therapy: Secondary | ICD-10-CM | POA: Diagnosis not present

## 2022-02-22 DIAGNOSIS — G893 Neoplasm related pain (acute) (chronic): Secondary | ICD-10-CM | POA: Insufficient documentation

## 2022-02-22 DIAGNOSIS — Z803 Family history of malignant neoplasm of breast: Secondary | ICD-10-CM | POA: Diagnosis not present

## 2022-02-22 DIAGNOSIS — R5383 Other fatigue: Secondary | ICD-10-CM | POA: Insufficient documentation

## 2022-02-22 DIAGNOSIS — E119 Type 2 diabetes mellitus without complications: Secondary | ICD-10-CM | POA: Diagnosis not present

## 2022-02-22 DIAGNOSIS — Z87891 Personal history of nicotine dependence: Secondary | ICD-10-CM | POA: Diagnosis not present

## 2022-02-22 DIAGNOSIS — C787 Secondary malignant neoplasm of liver and intrahepatic bile duct: Secondary | ICD-10-CM | POA: Diagnosis not present

## 2022-02-22 DIAGNOSIS — Z5982 Transportation insecurity: Secondary | ICD-10-CM | POA: Diagnosis not present

## 2022-02-22 DIAGNOSIS — C7931 Secondary malignant neoplasm of brain: Secondary | ICD-10-CM | POA: Insufficient documentation

## 2022-02-22 DIAGNOSIS — R160 Hepatomegaly, not elsewhere classified: Secondary | ICD-10-CM | POA: Insufficient documentation

## 2022-02-22 DIAGNOSIS — Z8249 Family history of ischemic heart disease and other diseases of the circulatory system: Secondary | ICD-10-CM | POA: Diagnosis not present

## 2022-02-22 DIAGNOSIS — E049 Nontoxic goiter, unspecified: Secondary | ICD-10-CM | POA: Insufficient documentation

## 2022-02-22 DIAGNOSIS — Z923 Personal history of irradiation: Secondary | ICD-10-CM | POA: Insufficient documentation

## 2022-02-22 DIAGNOSIS — R590 Localized enlarged lymph nodes: Secondary | ICD-10-CM | POA: Diagnosis not present

## 2022-02-22 DIAGNOSIS — Z5112 Encounter for antineoplastic immunotherapy: Secondary | ICD-10-CM | POA: Diagnosis not present

## 2022-02-22 LAB — COMPREHENSIVE METABOLIC PANEL
ALT: 44 U/L (ref 0–44)
AST: 36 U/L (ref 15–41)
Albumin: 3.6 g/dL (ref 3.5–5.0)
Alkaline Phosphatase: 92 U/L (ref 38–126)
Anion gap: 6 (ref 5–15)
BUN: 19 mg/dL (ref 8–23)
CO2: 25 mmol/L (ref 22–32)
Calcium: 9 mg/dL (ref 8.9–10.3)
Chloride: 102 mmol/L (ref 98–111)
Creatinine, Ser: 0.48 mg/dL (ref 0.44–1.00)
GFR, Estimated: >60 mL/min (ref >60)
Glucose, Bld: 102 mg/dL — ABNORMAL HIGH (ref 70–99)
Potassium: 3.6 mmol/L (ref 3.5–5.1)
Sodium: 133 mmol/L — ABNORMAL LOW (ref 135–145)
Total Bilirubin: 0.7 mg/dL (ref 0.3–1.2)
Total Protein: 6.6 g/dL (ref 6.5–8.1)

## 2022-02-22 LAB — CBC WITH DIFFERENTIAL/PLATELET
Abs Immature Granulocytes: 0.03 10*3/uL (ref 0.00–0.07)
Basophils Absolute: 0 10*3/uL (ref 0.0–0.1)
Basophils Relative: 0 %
Eosinophils Absolute: 0 10*3/uL (ref 0.0–0.5)
Eosinophils Relative: 0 %
HCT: 39.9 % (ref 36.0–46.0)
Hemoglobin: 13.5 g/dL (ref 12.0–15.0)
Immature Granulocytes: 1 %
Lymphocytes Relative: 11 %
Lymphs Abs: 0.7 10*3/uL (ref 0.7–4.0)
MCH: 29.7 pg (ref 26.0–34.0)
MCHC: 33.8 g/dL (ref 30.0–36.0)
MCV: 87.7 fL (ref 80.0–100.0)
Monocytes Absolute: 0.4 10*3/uL (ref 0.1–1.0)
Monocytes Relative: 7 %
Neutro Abs: 5 10*3/uL (ref 1.7–7.7)
Neutrophils Relative %: 81 %
Platelets: 183 10*3/uL (ref 150–400)
RBC: 4.55 MIL/uL (ref 3.87–5.11)
RDW: 16.5 % — ABNORMAL HIGH (ref 11.5–15.5)
WBC: 6.1 10*3/uL (ref 4.0–10.5)
nRBC: 0 % (ref 0.0–0.2)

## 2022-02-22 MED ORDER — HEPARIN SOD (PORK) LOCK FLUSH 100 UNIT/ML IV SOLN
500.0000 [IU] | Freq: Once | INTRAVENOUS | Status: AC | PRN
  Administered 2022-02-22: 500 [IU]
  Filled 2022-02-22: qty 5

## 2022-02-22 MED ORDER — SODIUM CHLORIDE 0.9 % IV SOLN
1200.0000 mg | Freq: Once | INTRAVENOUS | Status: AC
  Administered 2022-02-22: 1200 mg via INTRAVENOUS
  Filled 2022-02-22: qty 20

## 2022-02-22 MED ORDER — LIDOCAINE-PRILOCAINE 2.5-2.5 % EX CREA: 1.0000 "application " | TOPICAL_CREAM | CUTANEOUS | 1 refills | Status: DC | PRN

## 2022-02-22 MED ORDER — SODIUM CHLORIDE 0.9 % IV SOLN
Freq: Once | INTRAVENOUS | Status: AC
  Filled 2022-02-22: qty 250

## 2022-02-22 NOTE — Progress Notes (Signed)
? ? ? ?Hematology/Oncology Consult note ?[place]  ?Telephone:([phone]) [phone] [phone] ? ?Patient Care Team: ?Leonel Ramsay, MD as PCP - General (Infectious Diseases) ?Telford Nab, RN as Sales executive ?Sindy Guadeloupe, MD as Consulting Physician (Hematology and Oncology)  ? ?Name of the patient: Doris Johnson  ?[id number]  ?Jun 27, 1951  ? ?Date of visit: 02/22/22 ? ?Diagnosis- extensive stage small cell lung cancer with liver and brain metastases ? ?Chief complaint/ Reason for visit-on treatment assessment prior to cycle 15 of maintenance Tecentriq ? ?Heme/Onc history:  Patient is a 71 year old female with a remote history of smoking in her teenage years and exposure to passive smoking.  She has been having ongoing midsternal pain which has been growing since the last 6 to 7 months.  She had been to urgent care as well in the past.  She finally had a CT chest with contrast done in October 2021 which showed a large mass in the left side of the mediastinum measuring 6.7 x 6.2 x 6.1 cm causing severe compression of the left main pulmonary artery and around the aortic arch in the left subclavian artery.  Enlarged thyroid gland.  Left upper lobe nodule 1 x 0.7 cm.  She then underwent bronchoscopy with Dr.Aleskerov left upper lobe biopsy was nondiagnostic.  However station 4, 7 and 10 L lymph nodes were consistent with metastatic small cell carcinoma. ?  ?PET CT scan showed enlarged level 3 cervical lymph node 2.2 cm that was hypermetabolic with an SUV of 9.7.  Large central 7.3 cm AP window mass.  5.7 cm left liver mass.  MRI brain with and without contrast showed 2 small foci of enhancement in the right cerebellar hemisphere and right temporal lobe without mass-effect or edema as well concerning for brain metastases ?  ?Patient had 2 cycles of carbo etoposide chemotherapy along with Tecentriq and subsequent scan showed no significant improvement in the primary lung mass  or liver mass.  She received radiation treatment to her primary lung mass and also seen by [facility] for second opinion.  Her pathology was reviewed at [facility] and reported as possible neuroendocrine neoplasm But stated that small cell carcinoma cannot be excluded but not definitely diagnostic for it.  However [facility] pathology feels that this is consistent with small cell lung cancer.  She has completed 4 cycles of carbo etoposide Tecentriq chemotherapy and is currently on maintenance Tecentriq.  Repeat liver biopsy was also performed and was consistent with small cell lung cancer ?  ?Patient has had stable disease on maintenance Tecentriq so far and she has been on single agent Tecentriq since April 2022 ?  ? new brain mets in march 2023. She completed WBRT ? ?Interval history-she reports some fatigue after whole brain radiation but otherwise tolerated it well.  Appetite and weight have remained stable. ? ?ECOG PS- 1 ?Pain scale- 0 ? ? ?Review of systems- Review of Systems  ?Constitutional:  Positive for malaise/fatigue. Negative for chills, fever and weight loss.  ?HENT:  Negative for congestion, ear discharge and nosebleeds.   ?Eyes:  Negative for blurred vision.  ?Respiratory:  Negative for cough, hemoptysis, sputum production, shortness of breath and wheezing.   ?Cardiovascular:  Negative for chest pain, palpitations, orthopnea and claudication.  ?Gastrointestinal:  Negative for abdominal pain, blood in stool, constipation, diarrhea, heartburn, melena, nausea and vomiting.  ?Genitourinary:  Negative for dysuria, flank pain, frequency, hematuria and urgency.  ?Musculoskeletal:  Negative for back pain, joint pain and myalgias.  ?  Skin:  Negative for rash.  ?Neurological:  Negative for dizziness, tingling, focal weakness, seizures, weakness and headaches.  ?Endo/Heme/Allergies:  Does not bruise/bleed easily.  ?Psychiatric/Behavioral:  Negative for depression and suicidal ideas. The patient does not have insomnia.     ? ? ? ?No Known Allergies ? ? ?Past Medical History:  ?Diagnosis Date  ? Cancer [facility])   ? Cataract   ? Diabetes mellitus without complication ([place])   ? Hyperlipidemia   ? Hypertension   ? Hypothyroidism   ? doctor's keeping an eye on thyroid   ? Lung cancer ([place])   ? ? ? ?Past Surgical History:  ?Procedure Laterality Date  ? ABDOMINAL HYSTERECTOMY    ? IR CV LINE INJECTION  09/28/2021  ? PORTA CATH INSERTION N/A 11/30/2020  ? Procedure: PORTA CATH INSERTION;  Surgeon: Algernon Huxley, MD;  Location: [place] [facility];  Service: Cardiovascular;  Laterality: N/A;  ? VIDEO BRONCHOSCOPY WITH ENDOBRONCHIAL NAVIGATION N/A 10/21/2020  ? Procedure: VIDEO BRONCHOSCOPY WITH ENDOBRONCHIAL NAVIGATION;  Surgeon: Ottie Glazier, MD;  Location: [facility];  Service: Thoracic;  Laterality: N/A;  ? VIDEO BRONCHOSCOPY WITH ENDOBRONCHIAL ULTRASOUND N/A 10/21/2020  ? Procedure: VIDEO BRONCHOSCOPY WITH ENDOBRONCHIAL ULTRASOUND;  Surgeon: Ottie Glazier, MD;  Location: [facility];  Service: Thoracic;  Laterality: N/A;  ? ? ?Social History  ? ?Socioeconomic History  ? Marital status: Single  ?  Spouse name: Not on file  ? Number of children: Not on file  ? Years of education: Not on file  ? Highest education level: Not on file  ?Occupational History  ? Not on file  ?Tobacco Use  ? Smoking status: Never  ? Smokeless tobacco: Never  ?Vaping Use  ? Vaping Use: Never used  ?Substance and Sexual Activity  ? Alcohol use: No  ? Drug use: No  ? Sexual activity: Not Currently  ?Other Topics Concern  ? Not on file  ?Social History Narrative  ? Not on file  ? ?Social Determinants of Health  ? ?Financial Resource Strain: Not on file  ?Food Insecurity: Food Insecurity Present  ? Worried About Charity fundraiser in the Last Year: Sometimes true  ? Ran Out of Food in the Last Year: Sometimes true  ?Transportation Needs: Unmet Transportation Needs  ? Lack of Transportation (Medical): Yes  ? Lack of Transportation (Non-Medical): Yes  ?Physical Activity: Not  on file  ?Stress: Not on file  ?Social Connections: Not on file  ?Intimate Partner Violence: Not on file  ? ? ?Family History  ?Problem Relation Age of Onset  ? Cancer Mother   ?     breast  ? Hypertension Mother   ? Breast cancer Mother 43  ? Heart disease Father   ? Hyperlipidemia Brother   ? Heart disease Brother   ? ? ? ?Current Outpatient Medications:  ?  dexamethasone (DECADRON) 4 MG tablet, Take 1 tablet (4 mg total) by mouth 2 (two) times daily with a meal., Disp: 60 tablet, Rfl: 1 ?  fluconazole (DIFLUCAN) 100 MG tablet, Take 1 tablet (100 mg total) by mouth daily., Disp: 7 tablet, Rfl: 0 ?  fluticasone (FLONASE) 50 MCG/ACT nasal spray, Place 2 sprays into both nostrils daily., Disp: 16 g, Rfl: 5 ?  furosemide (LASIX) 20 MG tablet, Take 1 tablet (20 mg total) by mouth daily., Disp: 60 tablet, Rfl: 0 ?  lansoprazole (PREVACID) 30 MG capsule, Take 1 capsule (30 mg total) by mouth daily., Disp: 30 capsule, Rfl: 1 ?  losartan (  COZAAR) 25 MG tablet, Take 25 mg by mouth daily., Disp: , Rfl:  ?  oxycodone (ROXICODONE) 30 MG immediate release tablet, Take 1 tablet (30 mg total) by mouth every 4 (four) hours as needed for pain., Disp: 180 tablet, Rfl: 0 ?  potassium chloride SA (KLOR-CON M) 20 MEQ tablet, Take 2 tablets (40 mEq total) by mouth daily., Disp: 7 tablet, Rfl: 0 ?  traZODone (DESYREL) 50 MG tablet, Take by mouth., Disp: , Rfl:  ?  lidocaine-prilocaine (EMLA) cream, Apply 1 application. topically as needed. Apply small amount to port site at least 1 hour prior to it being accessed, cover with plastic wrap, Disp: 30 g, Rfl: 1 ?  LORazepam (ATIVAN) 0.5 MG tablet, Take 1 tablet (0.5 mg total) by mouth every 6 (six) hours as needed (Nausea or vomiting). (Patient not taking: Reported on 09/28/2021), Disp: 30 tablet, Rfl: 0 ?  mirtazapine (REMERON) 15 MG tablet, Take 15 mg by mouth at bedtime. (Patient not taking: Reported on 12/21/2021), Disp: , Rfl:  ?  naloxone (NARCAN) nasal spray 4 mg/0.1 mL, , Disp: , Rfl:   ?  ondansetron (ZOFRAN) 8 MG tablet, Take 1 tablet (8 mg total) by mouth 2 (two) times daily as needed for refractory nausea / vomiting. Start on day 3 after carboplatin chemo. (Patient not taking: Reported on

## 2022-03-01 ENCOUNTER — Other Ambulatory Visit: Payer: Self-pay | Admitting: Radiation Oncology

## 2022-03-15 ENCOUNTER — Inpatient Hospital Stay (HOSPITAL_BASED_OUTPATIENT_CLINIC_OR_DEPARTMENT_OTHER): Payer: Medicare HMO | Admitting: Oncology

## 2022-03-15 ENCOUNTER — Encounter: Payer: Self-pay | Admitting: Oncology

## 2022-03-15 ENCOUNTER — Inpatient Hospital Stay: Payer: Medicare HMO | Attending: Oncology

## 2022-03-15 ENCOUNTER — Inpatient Hospital Stay: Payer: Medicare HMO

## 2022-03-15 VITALS — BP 157/89 | HR 58 | Temp 97.1°F | Resp 18 | Wt 163.9 lb

## 2022-03-15 DIAGNOSIS — R5383 Other fatigue: Secondary | ICD-10-CM | POA: Insufficient documentation

## 2022-03-15 DIAGNOSIS — Z87891 Personal history of nicotine dependence: Secondary | ICD-10-CM | POA: Insufficient documentation

## 2022-03-15 DIAGNOSIS — C3490 Malignant neoplasm of unspecified part of unspecified bronchus or lung: Secondary | ICD-10-CM

## 2022-03-15 DIAGNOSIS — C3412 Malignant neoplasm of upper lobe, left bronchus or lung: Secondary | ICD-10-CM | POA: Diagnosis present

## 2022-03-15 DIAGNOSIS — M7989 Other specified soft tissue disorders: Secondary | ICD-10-CM | POA: Diagnosis not present

## 2022-03-15 DIAGNOSIS — Z5941 Food insecurity: Secondary | ICD-10-CM | POA: Diagnosis not present

## 2022-03-15 DIAGNOSIS — Z803 Family history of malignant neoplasm of breast: Secondary | ICD-10-CM | POA: Diagnosis not present

## 2022-03-15 DIAGNOSIS — Z5112 Encounter for antineoplastic immunotherapy: Secondary | ICD-10-CM | POA: Diagnosis present

## 2022-03-15 DIAGNOSIS — C787 Secondary malignant neoplasm of liver and intrahepatic bile duct: Secondary | ICD-10-CM

## 2022-03-15 DIAGNOSIS — Z8349 Family history of other endocrine, nutritional and metabolic diseases: Secondary | ICD-10-CM | POA: Insufficient documentation

## 2022-03-15 DIAGNOSIS — C7931 Secondary malignant neoplasm of brain: Secondary | ICD-10-CM | POA: Diagnosis not present

## 2022-03-15 DIAGNOSIS — I10 Essential (primary) hypertension: Secondary | ICD-10-CM | POA: Diagnosis not present

## 2022-03-15 DIAGNOSIS — Z7952 Long term (current) use of systemic steroids: Secondary | ICD-10-CM | POA: Diagnosis not present

## 2022-03-15 DIAGNOSIS — G893 Neoplasm related pain (acute) (chronic): Secondary | ICD-10-CM | POA: Diagnosis not present

## 2022-03-15 DIAGNOSIS — Z8249 Family history of ischemic heart disease and other diseases of the circulatory system: Secondary | ICD-10-CM | POA: Diagnosis not present

## 2022-03-15 DIAGNOSIS — Z79899 Other long term (current) drug therapy: Secondary | ICD-10-CM | POA: Insufficient documentation

## 2022-03-15 DIAGNOSIS — E119 Type 2 diabetes mellitus without complications: Secondary | ICD-10-CM | POA: Insufficient documentation

## 2022-03-15 DIAGNOSIS — R590 Localized enlarged lymph nodes: Secondary | ICD-10-CM | POA: Insufficient documentation

## 2022-03-15 DIAGNOSIS — Z5982 Transportation insecurity: Secondary | ICD-10-CM | POA: Insufficient documentation

## 2022-03-15 DIAGNOSIS — R519 Headache, unspecified: Secondary | ICD-10-CM | POA: Insufficient documentation

## 2022-03-15 DIAGNOSIS — E049 Nontoxic goiter, unspecified: Secondary | ICD-10-CM | POA: Diagnosis not present

## 2022-03-15 LAB — COMPREHENSIVE METABOLIC PANEL
ALT: 20 U/L (ref 0–44)
AST: 17 U/L (ref 15–41)
Albumin: 3.6 g/dL (ref 3.5–5.0)
Alkaline Phosphatase: 86 U/L (ref 38–126)
Anion gap: 6 (ref 5–15)
BUN: 10 mg/dL (ref 8–23)
CO2: 26 mmol/L (ref 22–32)
Calcium: 9.3 mg/dL (ref 8.9–10.3)
Chloride: 103 mmol/L (ref 98–111)
Creatinine, Ser: 0.6 mg/dL (ref 0.44–1.00)
GFR, Estimated: >60 mL/min (ref >60)
Glucose, Bld: 145 mg/dL — ABNORMAL HIGH (ref 70–99)
Potassium: 3.6 mmol/L (ref 3.5–5.1)
Sodium: 135 mmol/L (ref 135–145)
Total Bilirubin: 0.3 mg/dL (ref 0.3–1.2)
Total Protein: 6.7 g/dL (ref 6.5–8.1)

## 2022-03-15 LAB — CBC WITH DIFFERENTIAL/PLATELET
Abs Immature Granulocytes: 0.04 10*3/uL (ref 0.00–0.07)
Basophils Absolute: 0 10*3/uL (ref 0.0–0.1)
Basophils Relative: 0 %
Eosinophils Absolute: 0 10*3/uL (ref 0.0–0.5)
Eosinophils Relative: 0 %
HCT: 37.1 % (ref 36.0–46.0)
Hemoglobin: 12.3 g/dL (ref 12.0–15.0)
Immature Granulocytes: 1 %
Lymphocytes Relative: 8 %
Lymphs Abs: 0.5 10*3/uL — ABNORMAL LOW (ref 0.7–4.0)
MCH: 29.9 pg (ref 26.0–34.0)
MCHC: 33.2 g/dL (ref 30.0–36.0)
MCV: 90.3 fL (ref 80.0–100.0)
Monocytes Absolute: 0.3 10*3/uL (ref 0.1–1.0)
Monocytes Relative: 4 %
Neutro Abs: 5.6 10*3/uL (ref 1.7–7.7)
Neutrophils Relative %: 87 %
Platelets: 216 10*3/uL (ref 150–400)
RBC: 4.11 MIL/uL (ref 3.87–5.11)
RDW: 15.4 % (ref 11.5–15.5)
WBC: 6.4 10*3/uL (ref 4.0–10.5)
nRBC: 0 % (ref 0.0–0.2)

## 2022-03-15 MED ORDER — SODIUM CHLORIDE 0.9 % IV SOLN
1200.0000 mg | Freq: Once | INTRAVENOUS | Status: AC
  Administered 2022-03-15: 1200 mg via INTRAVENOUS
  Filled 2022-03-15: qty 20

## 2022-03-15 MED ORDER — SODIUM CHLORIDE 0.9 % IV SOLN
Freq: Once | INTRAVENOUS | Status: AC
  Filled 2022-03-15: qty 250

## 2022-03-15 MED ORDER — HEPARIN SOD (PORK) LOCK FLUSH 100 UNIT/ML IV SOLN
500.0000 [IU] | Freq: Once | INTRAVENOUS | Status: AC | PRN
  Administered 2022-03-15: 500 [IU]
  Filled 2022-03-15: qty 5

## 2022-03-15 NOTE — Progress Notes (Signed)
Pt states that at times she feels off balance and shaky. Recently has not been able to sleep; states the trazadone was helping ut now doesnt seem to help much she either gets real sleepy or not sleepy at all.Also, pt states she has noticed that she can not remember a lot of things.  ?

## 2022-03-15 NOTE — Patient Instructions (Signed)
[facility] CANCER CTR AT [place]  Discharge Instructions: ?Thank you for choosing [facility] to provide your oncology and hematology care.  ?If you have a lab appointment with the [facility], please go directly to the [facility] and check in at the registration area. ? ?Wear comfortable clothing and clothing appropriate for easy access to any Portacath or PICC line.  ? ?We strive to give you quality time with your provider. You may need to reschedule your appointment if you arrive late (15 or more minutes).  Arriving late affects you and other patients whose appointments are after yours.  Also, if you miss three or more appointments without notifying the office, you may be dismissed from the clinic at the provider?s discretion.    ?  ?For prescription refill requests, have your pharmacy contact our office and allow 72 hours for refills to be completed.   ? ?Today you received the following chemotherapy and/or immunotherapy agents TECENTRIQ    ?  ?To help prevent nausea and vomiting after your treatment, we encourage you to take your nausea medication as directed. ? ?BELOW ARE SYMPTOMS THAT SHOULD BE REPORTED IMMEDIATELY: ?*FEVER GREATER THAN 100.4 F (38 ?C) OR HIGHER ?*CHILLS OR SWEATING ?*NAUSEA AND VOMITING THAT IS NOT CONTROLLED WITH YOUR NAUSEA MEDICATION ?*UNUSUAL SHORTNESS OF BREATH ?*UNUSUAL BRUISING OR BLEEDING ?*URINARY PROBLEMS (pain or burning when urinating, or frequent urination) ?*BOWEL PROBLEMS (unusual diarrhea, constipation, pain near the anus) ?TENDERNESS IN MOUTH AND THROAT WITH OR WITHOUT PRESENCE OF ULCERS (sore throat, sores in mouth, or a toothache) ?UNUSUAL RASH, SWELLING OR PAIN  ?UNUSUAL VAGINAL DISCHARGE OR ITCHING  ? ?Items with * indicate a potential emergency and should be followed up as soon as possible or go to the Emergency Department if any problems should occur. ? ?Please show the CHEMOTHERAPY ALERT CARD or IMMUNOTHERAPY ALERT CARD at check-in to  the Emergency Department and triage nurse. ? ?Should you have questions after your visit or need to cancel or reschedule your appointment, please contact [facility] CANCER [place] AT [place]  [phone] and follow the prompts.  Office hours are 8:00 a.m. to 4:30 p.m. Monday - Friday. Please note that voicemails left after 4:00 p.m. may not be returned until the following business day.  We are closed weekends and major holidays. You have access to a nurse at all times for urgent questions. Please call the main number to the clinic [phone] and follow the prompts. ? ?For any non-urgent questions, you may also contact your provider using MyChart. We now offer e-Visits for anyone 1 and older to request care online for non-urgent symptoms. For details visit [email or web address]. ?  ?Also download the MyChart app! Go to the app store, search "MyChart", open the app, select [facility], and log in with your MyChart username and password. ? ?Due to Covid, a mask is required upon entering the hospital/clinic. If you do not have a mask, one will be given to you upon arrival. For doctor visits, patients may have 1 support person aged 58 or older with them. For treatment visits, patients cannot have anyone with them due to current Covid guidelines and our immunocompromised population.  ? ?Atezolizumab injection ?What is this medication? ?ATEZOLIZUMAB (a te zoe LIZ ue mab) is a monoclonal antibody. It is used to treat bladder cancer (urothelial cancer), liver cancer, lung cancer, and melanoma. ?This medicine may be used for other purposes; ask your health care provider or pharmacist if you have questions. ?COMMON BRAND NAME(S): Tecentriq ?  What should I tell my care team before I take this medication? ?They need to know if you have any of these conditions: ?autoimmune diseases like Crohn's disease, ulcerative colitis, or lupus ?have had or planning to have an allogeneic stem cell transplant (uses someone else's  stem cells) ?history of organ transplant ?history of radiation to the chest ?nervous system problems like myasthenia gravis or Guillain-Barre syndrome ?an unusual or allergic reaction to atezolizumab, other medicines, foods, dyes, or preservatives ?pregnant or trying to get pregnant ?breast-feeding ?How should I use this medication? ?This medicine is for infusion into a vein. It is given by a health care professional in a hospital or clinic setting. ?A special MedGuide will be given to you before each treatment. Be sure to read this information carefully each time. ?Talk to your pediatrician regarding the use of this medicine in children. Special care may be needed. ?Overdosage: If you think you have taken too much of this medicine contact a poison control center or emergency room at once. ?NOTE: This medicine is only for you. Do not share this medicine with others. ?What if I miss a dose? ?It is important not to miss your dose. Call your doctor or health care professional if you are unable to keep an appointment. ?What may interact with this medication? ?Interactions have not been studied. ?This list may not describe all possible interactions. Give your health care provider a list of all the medicines, herbs, non-prescription drugs, or dietary supplements you use. Also tell them if you smoke, drink alcohol, or use illegal drugs. Some items may interact with your medicine. ?What should I watch for while using this medication? ?Your condition will be monitored carefully while you are receiving this medicine. ?You may need blood work done while you are taking this medicine. ?Do not become pregnant while taking this medicine or for at least 5 months after stopping it. Women should inform their doctor if they wish to become pregnant or think they might be pregnant. There is a potential for serious side effects to an unborn child. Talk to your health care professional or pharmacist for more information. Do not  breast-feed an infant while taking this medicine or for at least 5 months after the last dose. ?What side effects may I notice from receiving this medication? ?Side effects that you should report to your doctor or health care professional as soon as possible: ?allergic reactions like skin rash, itching or hives, swelling of the face, lips, or tongue ?black, tarry stools ?bloody or watery diarrhea ?breathing problems ?changes in vision ?chest pain or chest tightness ?chills ?facial flushing ?fever ?headache ?signs and symptoms of high blood sugar such as dizziness; dry mouth; dry skin; fruity breath; nausea; stomach pain; increased hunger or thirst; increased urination ?signs and symptoms of liver injury like dark yellow or brown urine; general ill feeling or flu-like symptoms; light-colored stools; loss of appetite; nausea; right upper belly pain; unusually weak or tired; yellowing of the eyes or skin ?stomach pain ?trouble passing urine or change in the amount of urine ?Side effects that usually do not require medical attention (report to your doctor or health care professional if they continue or are bothersome): ?bone pain ?cough ?diarrhea ?joint pain ?muscle pain ?muscle weakness ?swelling of arms or legs ?tiredness ?weight loss ?This list may not describe all possible side effects. Call your doctor for medical advice about side effects. You may report side effects to FDA at 1-800-FDA-1088. ?Where should I keep my medication? ?This drug  is given in a hospital or clinic and will not be stored at home. ?NOTE: This sheet is a summary. It may not cover all possible information. If you have questions about this medicine, talk to your doctor, pharmacist, or health care provider. ?? 2023 Elsevier/Gold Standard (2021-10-01 00:00:00) ? ?

## 2022-03-15 NOTE — Progress Notes (Signed)
? ? ? ?Hematology/Oncology Consult note ?[facility]  ?[phone] [phone] ? ?Patient Care Team: ?Leonel Ramsay, MD as PCP - General (Infectious Diseases) ?Telford Nab, RN as Sales executive ?Sindy Guadeloupe, MD as Consulting Physician (Hematology and Oncology)  ? ?Name of the patient: Doris Johnson  ?[id number]  ?July 17, 1951  ? ?Date of visit: 03/15/22 ? ?Diagnosis- extensive stage small cell lung cancer with liver and brain metastases ? ?Chief complaint/ Reason for visit-on treatment assessment prior to cycle 16 of maintenance Tecentriq ? ?Heme/Onc history: Patient is a 71 year old female with a remote history of smoking in her teenage years and exposure to passive smoking.  She has been having ongoing midsternal pain which has been growing since the last 6 to 7 months.  She had been to urgent care as well in the past.  She finally had a CT chest with contrast done in October 2021 which showed a large mass in the left side of the mediastinum measuring 6.7 x 6.2 x 6.1 cm causing severe compression of the left main pulmonary artery and around the aortic arch in the left subclavian artery.  Enlarged thyroid gland.  Left upper lobe nodule 1 x 0.7 cm.  She then underwent bronchoscopy with Dr.Aleskerov left upper lobe biopsy was nondiagnostic.  However station 4, 7 and 10 L lymph nodes were consistent with metastatic small cell carcinoma. ?  ?PET CT scan showed enlarged level 3 cervical lymph node 2.2 cm that was hypermetabolic with an SUV of 9.7.  Large central 7.3 cm AP window mass.  5.7 cm left liver mass.  MRI brain with and without contrast showed 2 small foci of enhancement in the right cerebellar hemisphere and right temporal lobe without mass-effect or edema as well concerning for brain metastases ?  ?Patient had 2 cycles of carbo etoposide chemotherapy along with Tecentriq and subsequent scan showed no significant improvement in the primary lung mass or  liver mass.  She received radiation treatment to her primary lung mass and also seen by [facility] for second opinion.  Her pathology was reviewed at [facility] and reported as possible neuroendocrine neoplasm But stated that small cell carcinoma cannot be excluded but not definitely diagnostic for it.  However [facility] pathology feels that this is consistent with small cell lung cancer.  She has completed 4 cycles of carbo etoposide Tecentriq chemotherapy and is currently on maintenance Tecentriq.  Repeat liver biopsy was also performed and was consistent with small cell lung cancer ?  ?Patient has had stable disease on maintenance Tecentriq so far and she has been on single agent Tecentriq since April 2022 ?  ? new brain mets in march 2023. She completed WBRT ?  ? ?Interval history-overall she is doing well and other than mild fatigue she denies other complaints at this time.  Bilateral lower extremity swelling is stable with compression stockings ? ?ECOG PS- 1 ?Pain scale- 0 ?Opioid associated constipation- no ? ?Review of systems- Review of Systems  ?Constitutional:  Positive for malaise/fatigue. Negative for chills, fever and weight loss.  ?HENT:  Negative for congestion, ear discharge and nosebleeds.   ?Eyes:  Negative for blurred vision.  ?Respiratory:  Negative for cough, hemoptysis, sputum production, shortness of breath and wheezing.   ?Cardiovascular:  Negative for chest pain, palpitations, orthopnea and claudication.  ?Gastrointestinal:  Negative for abdominal pain, blood in stool, constipation, diarrhea, heartburn, melena, nausea and vomiting.  ?Genitourinary:  Negative for dysuria, flank pain, frequency, hematuria and urgency.  ?  Musculoskeletal:  Negative for back pain, joint pain and myalgias.  ?Skin:  Negative for rash.  ?Neurological:  Negative for dizziness, tingling, focal weakness, seizures, weakness and headaches.  ?Endo/Heme/Allergies:  Does not bruise/bleed easily.  ?Psychiatric/Behavioral:  Negative for  depression and suicidal ideas. The patient does not have insomnia.    ? ? ?No Known Allergies ? ? ?Past Medical History:  ?Diagnosis Date  ? Cancer [facility])   ? Cataract   ? Diabetes mellitus without complication ([place])   ? Hyperlipidemia   ? Hypertension   ? Hypothyroidism   ? doctor's keeping an eye on thyroid   ? Lung cancer ([place])   ? ? ? ?Past Surgical History:  ?Procedure Laterality Date  ? ABDOMINAL HYSTERECTOMY    ? IR CV LINE INJECTION  09/28/2021  ? PORTA CATH INSERTION N/A 11/30/2020  ? Procedure: PORTA CATH INSERTION;  Surgeon: Algernon Huxley, MD;  Location: [facility] CV LAB;  Service: Cardiovascular;  Laterality: N/A;  ? VIDEO BRONCHOSCOPY WITH ENDOBRONCHIAL NAVIGATION N/A 10/21/2020  ? Procedure: VIDEO BRONCHOSCOPY WITH ENDOBRONCHIAL NAVIGATION;  Surgeon: Ottie Glazier, MD;  Location: [facility] ORS;  Service: Thoracic;  Laterality: N/A;  ? VIDEO BRONCHOSCOPY WITH ENDOBRONCHIAL ULTRASOUND N/A 10/21/2020  ? Procedure: VIDEO BRONCHOSCOPY WITH ENDOBRONCHIAL ULTRASOUND;  Surgeon: Ottie Glazier, MD;  Location: [facility] ORS;  Service: Thoracic;  Laterality: N/A;  ? ? ?Social History  ? ?Socioeconomic History  ? Marital status: Single  ?  Spouse name: Not on file  ? Number of children: Not on file  ? Years of education: Not on file  ? Highest education level: Not on file  ?Occupational History  ? Not on file  ?Tobacco Use  ? Smoking status: Never  ? Smokeless tobacco: Never  ?Vaping Use  ? Vaping Use: Never used  ?Substance and Sexual Activity  ? Alcohol use: No  ? Drug use: No  ? Sexual activity: Not Currently  ?Other Topics Concern  ? Not on file  ?Social History Narrative  ? Not on file  ? ?Social Determinants of Health  ? ?Financial Resource Strain: Not on file  ?Food Insecurity: Food Insecurity Present  ? Worried About Charity fundraiser in the Last Year: Sometimes true  ? Ran Out of Food in the Last Year: Sometimes true  ?Transportation Needs: Unmet Transportation Needs  ? Lack of Transportation (Medical): Yes  ?  Lack of Transportation (Non-Medical): Yes  ?Physical Activity: Not on file  ?Stress: Not on file  ?Social Connections: Not on file  ?Intimate Partner Violence: Not on file  ? ? ?Family History  ?Problem Relation Age of Onset  ? Cancer Mother   ?     breast  ? Hypertension Mother   ? Breast cancer Mother 13  ? Heart disease Father   ? Hyperlipidemia Brother   ? Heart disease Brother   ? ? ? ?Current Outpatient Medications:  ?  dexamethasone (DECADRON) 4 MG tablet, Take 1 tablet (4 mg total) by mouth 2 (two) times daily with a meal., Disp: 60 tablet, Rfl: 1 ?  fluticasone (FLONASE) 50 MCG/ACT nasal spray, Place 2 sprays into both nostrils daily., Disp: 16 g, Rfl: 5 ?  furosemide (LASIX) 20 MG tablet, Take 1 tablet (20 mg total) by mouth daily., Disp: 60 tablet, Rfl: 0 ?  lansoprazole (PREVACID) 30 MG capsule, Take 1 capsule (30 mg total) by mouth daily., Disp: 30 capsule, Rfl: 1 ?  lidocaine-prilocaine (EMLA) cream, Apply 1 application. topically as needed. Apply small amount  to port site at least 1 hour prior to it being accessed, cover with plastic wrap, Disp: 30 g, Rfl: 1 ?  losartan (COZAAR) 25 MG tablet, Take 25 mg by mouth daily., Disp: , Rfl:  ?  oxycodone (ROXICODONE) 30 MG immediate release tablet, Take 1 tablet (30 mg total) by mouth every 4 (four) hours as needed for pain., Disp: 180 tablet, Rfl: 0 ?  potassium chloride SA (KLOR-CON M) 20 MEQ tablet, Take 2 tablets (40 mEq total) by mouth daily., Disp: 7 tablet, Rfl: 0 ?  traZODone (DESYREL) 50 MG tablet, Take by mouth., Disp: , Rfl:  ?  LORazepam (ATIVAN) 0.5 MG tablet, Take 1 tablet (0.5 mg total) by mouth every 6 (six) hours as needed (Nausea or vomiting). (Patient not taking: Reported on 09/28/2021), Disp: 30 tablet, Rfl: 0 ?  mirtazapine (REMERON) 15 MG tablet, Take 15 mg by mouth at bedtime. (Patient not taking: Reported on 12/21/2021), Disp: , Rfl:  ?  naloxone (NARCAN) nasal spray 4 mg/0.1 mL, , Disp: , Rfl:  ?  ondansetron (ZOFRAN) 8 MG tablet, Take  1 tablet (8 mg total) by mouth 2 (two) times daily as needed for refractory nausea / vomiting. Start on day 3 after carboplatin chemo. (Patient not taking: Reported on 10/19/2021), Disp: 30 tablet, Rfl: 1

## 2022-03-23 ENCOUNTER — Encounter: Payer: Self-pay | Admitting: Oncology

## 2022-03-23 ENCOUNTER — Inpatient Hospital Stay: Payer: Medicare HMO

## 2022-03-23 ENCOUNTER — Encounter: Payer: Self-pay | Admitting: Radiation Oncology

## 2022-03-23 ENCOUNTER — Ambulatory Visit
Admission: RE | Admit: 2022-03-23 | Discharge: 2022-03-23 | Disposition: A | Discharge status: Home / Self Care | Payer: Medicare HMO | Source: Ambulatory Visit | Attending: Radiation Oncology | Admitting: Radiation Oncology

## 2022-03-23 VITALS — BP 135/73 | HR 59 | Temp 96.2°F | Resp 18 | Ht 66.0 in | Wt 167.1 lb

## 2022-03-23 DIAGNOSIS — C787 Secondary malignant neoplasm of liver and intrahepatic bile duct: Secondary | ICD-10-CM | POA: Diagnosis not present

## 2022-03-23 DIAGNOSIS — C7931 Secondary malignant neoplasm of brain: Secondary | ICD-10-CM | POA: Insufficient documentation

## 2022-03-23 DIAGNOSIS — Z923 Personal history of irradiation: Secondary | ICD-10-CM | POA: Diagnosis not present

## 2022-03-23 DIAGNOSIS — C3490 Malignant neoplasm of unspecified part of unspecified bronchus or lung: Secondary | ICD-10-CM | POA: Insufficient documentation

## 2022-03-23 DIAGNOSIS — C771 Secondary and unspecified malignant neoplasm of intrathoracic lymph nodes: Secondary | ICD-10-CM | POA: Diagnosis not present

## 2022-03-23 NOTE — Progress Notes (Signed)
Radiation Oncology ?Follow up Note ? ?Name: Doris Johnson   ?Date:   03/23/2022 ?MRN:  [id number] ?DOB: 03/25/1951  ? ? ?This 72 y.o. female presents to the clinic today for 1 month follow-up status post whole brain radiation therapy for extensive stage small cell lung cancer with brain involvement. ? ?REFERRING PROVIDER: Leonel Ramsay, MD ? ?HPI: Patient is a 71 year old female with extensive stage small cell lung cancer with both liver and brain mets now at 1 month from whole brain radiation.  She is doing well.  She specifically [place] headaches any focal neurologic deficits or change in visual fields.  She is currently on maintenance Tecentriq which she is tolerating well.. ? ?COMPLICATIONS OF TREATMENT: none ? ?FOLLOW UP COMPLIANCE: keeps appointments  ? ?PHYSICAL EXAM:  ?BP 135/73   Pulse (!) 59   Temp (!) 96.2 ?F (35.7 ?C)   Resp 18   Ht 5\' 6"  (1.676 m)   Wt 167 lb 1.6 oz (75.8 kg)   BMI 26.97 kg/m?  ?Crude visual fields within normal range motor or sensory and DTR levels are equal and symmetric in upper lower extremities.  Well-developed well-nourished patient in NAD. HEENT reveals PERLA, EOMI, discs not visualized.  Oral cavity is clear. No oral mucosal lesions are identified. Neck is clear without evidence of cervical or supraclavicular adenopathy. Lungs are clear to A&P. Cardiac examination is essentially unremarkable with regular rate and rhythm without murmur rub or thrill. Abdomen is benign with no organomegaly or masses noted. Motor sensory and DTR levels are equal and symmetric in the upper and lower extremities. Cranial nerves II through XII are grossly intact. Proprioception is intact. No peripheral adenopathy or edema is identified. No motor or sensory levels are noted. Crude visual fields are within normal range. ? ?RADIOLOGY RESULTS: No current films for review ? ?PLAN: Present time patient is clinically stable continues on maintenance Tecentriq under medical oncology's direction.  I  am pleased with her overall progress have we will see her back in follow-up on an as-needed basis.  I will turn follow-up care over to medical oncology.  Patient is to call with any concerns. ? ?I would like to take this opportunity to thank you for allowing me to participate in the care of your patient.. ?  ? Noreene Filbert, MD ? ?

## 2022-04-05 ENCOUNTER — Inpatient Hospital Stay: Payer: Medicare HMO

## 2022-04-05 ENCOUNTER — Encounter: Payer: Self-pay | Admitting: Oncology

## 2022-04-05 ENCOUNTER — Inpatient Hospital Stay (HOSPITAL_BASED_OUTPATIENT_CLINIC_OR_DEPARTMENT_OTHER): Payer: Medicare HMO | Admitting: Oncology

## 2022-04-05 VITALS — BP 109/75 | HR 85 | Temp 96.8°F | Resp 16 | Wt 154.4 lb

## 2022-04-05 DIAGNOSIS — G9389 Other specified disorders of brain: Secondary | ICD-10-CM | POA: Diagnosis not present

## 2022-04-05 DIAGNOSIS — C3490 Malignant neoplasm of unspecified part of unspecified bronchus or lung: Secondary | ICD-10-CM

## 2022-04-05 DIAGNOSIS — C787 Secondary malignant neoplasm of liver and intrahepatic bile duct: Secondary | ICD-10-CM

## 2022-04-05 DIAGNOSIS — Z5112 Encounter for antineoplastic immunotherapy: Secondary | ICD-10-CM

## 2022-04-05 LAB — COMPREHENSIVE METABOLIC PANEL
ALT: 9 U/L (ref 0–44)
AST: 18 U/L (ref 15–41)
Albumin: 3.7 g/dL (ref 3.5–5.0)
Alkaline Phosphatase: 47 U/L (ref 38–126)
Anion gap: 9 (ref 5–15)
BUN: 13 mg/dL (ref 8–23)
CO2: 22 mmol/L (ref 22–32)
Calcium: 9.4 mg/dL (ref 8.9–10.3)
Chloride: 105 mmol/L (ref 98–111)
Creatinine, Ser: 0.68 mg/dL (ref 0.44–1.00)
GFR, Estimated: >60 mL/min (ref >60)
Glucose, Bld: 175 mg/dL — ABNORMAL HIGH (ref 70–99)
Potassium: 3.3 mmol/L — ABNORMAL LOW (ref 3.5–5.1)
Sodium: 136 mmol/L (ref 135–145)
Total Bilirubin: 1 mg/dL (ref 0.3–1.2)
Total Protein: 6.7 g/dL (ref 6.5–8.1)

## 2022-04-05 LAB — CBC WITH DIFFERENTIAL/PLATELET
Abs Immature Granulocytes: 0.01 10*3/uL (ref 0.00–0.07)
Basophils Absolute: 0 10*3/uL (ref 0.0–0.1)
Basophils Relative: 0 %
Eosinophils Absolute: 0 10*3/uL (ref 0.0–0.5)
Eosinophils Relative: 1 %
HCT: 38.5 % (ref 36.0–46.0)
Hemoglobin: 13 g/dL (ref 12.0–15.0)
Immature Granulocytes: 0 %
Lymphocytes Relative: 18 %
Lymphs Abs: 0.9 10*3/uL (ref 0.7–4.0)
MCH: 31 pg (ref 26.0–34.0)
MCHC: 33.8 g/dL (ref 30.0–36.0)
MCV: 91.7 fL (ref 80.0–100.0)
Monocytes Absolute: 0.4 10*3/uL (ref 0.1–1.0)
Monocytes Relative: 9 %
Neutro Abs: 3.4 10*3/uL (ref 1.7–7.7)
Neutrophils Relative %: 72 %
Platelets: 208 10*3/uL (ref 150–400)
RBC: 4.2 MIL/uL (ref 3.87–5.11)
RDW: 15 % (ref 11.5–15.5)
WBC: 4.7 10*3/uL (ref 4.0–10.5)
nRBC: 0 % (ref 0.0–0.2)

## 2022-04-05 LAB — TSH: TSH: 0.381 u[IU]/mL (ref 0.350–4.500)

## 2022-04-05 MED ORDER — HEPARIN SOD (PORK) LOCK FLUSH 100 UNIT/ML IV SOLN
INTRAVENOUS | Status: AC
  Administered 2022-04-05: 500 [IU]
  Filled 2022-04-05: qty 5

## 2022-04-05 MED ORDER — HEPARIN SOD (PORK) LOCK FLUSH 100 UNIT/ML IV SOLN
500.0000 [IU] | Freq: Once | INTRAVENOUS | Status: AC | PRN
  Filled 2022-04-05: qty 5

## 2022-04-05 MED ORDER — SODIUM CHLORIDE 0.9 % IV SOLN
1200.0000 mg | Freq: Once | INTRAVENOUS | Status: AC
  Administered 2022-04-05: 1200 mg via INTRAVENOUS
  Filled 2022-04-05: qty 20

## 2022-04-05 MED ORDER — SODIUM CHLORIDE 0.9 % IV SOLN
Freq: Once | INTRAVENOUS | Status: AC
  Filled 2022-04-05: qty 250

## 2022-04-05 MED ORDER — SODIUM CHLORIDE 0.9% FLUSH
10.0000 mL | Freq: Once | INTRAVENOUS | Status: AC
  Administered 2022-04-05: 10 mL via INTRAVENOUS
  Filled 2022-04-05: qty 10

## 2022-04-05 NOTE — Patient Instructions (Signed)
[facility] CANCER CTR AT [place]  Discharge Instructions: Thank you for choosing [facility] to provide your oncology and hematology care.  If you have a lab appointment with the [facility], please go directly to the [place] and check in at the registration area.  Wear comfortable clothing and clothing appropriate for easy access to any Portacath or PICC line.   We strive to give you quality time with your provider. You may need to reschedule your appointment if you arrive late (15 or more minutes).  Arriving late affects you and other patients whose appointments are after yours.  Also, if you miss three or more appointments without notifying the office, you may be dismissed from the clinic at the provider's discretion.      For prescription refill requests, have your pharmacy contact our office and allow 72 hours for refills to be completed.    Today you received the following chemotherapy and/or immunotherapy agents Tecentriq    To help prevent nausea and vomiting after your treatment, we encourage you to take your nausea medication as directed.  BELOW ARE SYMPTOMS THAT SHOULD BE REPORTED IMMEDIATELY: *FEVER GREATER THAN 100.4 F (38 C) OR HIGHER *CHILLS OR SWEATING *NAUSEA AND VOMITING THAT IS NOT CONTROLLED WITH YOUR NAUSEA MEDICATION *UNUSUAL SHORTNESS OF BREATH *UNUSUAL BRUISING OR BLEEDING *URINARY PROBLEMS (pain or burning when urinating, or frequent urination) *BOWEL PROBLEMS (unusual diarrhea, constipation, pain near the anus) TENDERNESS IN MOUTH AND THROAT WITH OR WITHOUT PRESENCE OF ULCERS (sore throat, sores in mouth, or a toothache) UNUSUAL RASH, SWELLING OR PAIN  UNUSUAL VAGINAL DISCHARGE OR ITCHING   Items with * indicate a potential emergency and should be followed up as soon as possible or go to the Emergency Department if any problems should occur.  Please show the CHEMOTHERAPY ALERT CARD or IMMUNOTHERAPY ALERT CARD at check-in to the  Emergency Department and triage nurse.  Should you have questions after your visit or need to cancel or reschedule your appointment, please contact [facility] CANCER [place] AT [place]  [phone] and follow the prompts.  Office hours are 8:00 a.m. to 4:30 p.m. Monday - Friday. Please note that voicemails left after 4:00 p.m. may not be returned until the following business day.  We are closed weekends and major holidays. You have access to a nurse at all times for urgent questions. Please call the main number to the clinic [phone] and follow the prompts.  For any non-urgent questions, you may also contact your provider using MyChart. We now offer e-Visits for anyone 27 and older to request care online for non-urgent symptoms. For details visit [email or web address].   Also download the MyChart app! Go to the app store, search "MyChart", open the app, select [place], and log in with your MyChart username and password.  Due to Covid, a mask is required upon entering the hospital/clinic. If you do not have a mask, one will be given to you upon arrival. For doctor visits, patients may have 1 support person aged 12 or older with them. For treatment visits, patients cannot have anyone with them due to current Covid guidelines and our immunocompromised population.

## 2022-04-05 NOTE — Progress Notes (Signed)
Hematology/Oncology Consult note [facility]  Telephone:([phone] [phone]  Patient Care Team: Leonel Ramsay, MD as PCP - General (Infectious Diseases) Telford Nab, RN as Oncology Nurse Navigator Sindy Guadeloupe, MD as Consulting Physician (Hematology and Oncology)   Name of the patient: Doris Johnson  [id number]  01-30-1951   Date of visit: 04/05/22  Diagnosis- extensive stage small cell lung cancer with liver and brain metastases  Chief complaint/ Reason for visit-on treatment assessment prior to cycle 17 of maintenance Tecentriq  Heme/Onc history: Patient is a 71 year old female with a remote history of smoking in her teenage years and exposure to passive smoking.  She has been having ongoing midsternal pain which has been growing since the last 6 to 7 months.  She had been to urgent care as well in the past.  She finally had a CT chest with contrast done in October 2021 which showed a large mass in the left side of the mediastinum measuring 6.7 x 6.2 x 6.1 cm causing severe compression of the left main pulmonary artery and around the aortic arch in the left subclavian artery.  Enlarged thyroid gland.  Left upper lobe nodule 1 x 0.7 cm.  She then underwent bronchoscopy with Dr.Aleskerov left upper lobe biopsy was nondiagnostic.  However station 4, 7 and 10 L lymph nodes were consistent with metastatic small cell carcinoma.   PET CT scan showed enlarged level 3 cervical lymph node 2.2 cm that was hypermetabolic with an SUV of 9.7.  Large central 7.3 cm AP window mass.  5.7 cm left liver mass.  MRI brain with and without contrast showed 2 small foci of enhancement in the right cerebellar hemisphere and right temporal lobe without mass-effect or edema as well concerning for brain metastases   Patient had 2 cycles of carbo etoposide chemotherapy along with Tecentriq and subsequent scan showed no significant improvement in the primary lung mass or  liver mass.  She received radiation treatment to her primary lung mass and also seen by [facility] for second opinion.  Her pathology was reviewed at [facility] and reported as possible neuroendocrine neoplasm But stated that small cell carcinoma cannot be excluded but not definitely diagnostic for it.  However [facility] pathology feels that this is consistent with small cell lung cancer.  She has completed 4 cycles of carbo etoposide Tecentriq chemotherapy and is currently on maintenance Tecentriq.  Repeat liver biopsy was also performed and was consistent with small cell lung cancer   Patient has had stable disease on maintenance Tecentriq so far and she has been on single agent Tecentriq since April 2022    new brain mets in march 2023. She completed WBRT      Interval history-reports occasional headaches.  Denies any chest wall pain.  Overall tolerating treatments well.  ECOG PS- 1 Pain scale- 0   Review of systems- Review of Systems  Constitutional:  Positive for malaise/fatigue. Negative for chills, fever and weight loss.  HENT:  Negative for congestion, ear discharge and nosebleeds.   Eyes:  Negative for blurred vision.  Respiratory:  Negative for cough, hemoptysis, sputum production, shortness of breath and wheezing.   Cardiovascular:  Negative for chest pain, palpitations, orthopnea and claudication.  Gastrointestinal:  Negative for abdominal pain, blood in stool, constipation, diarrhea, heartburn, melena, nausea and vomiting.  Genitourinary:  Negative for dysuria, flank pain, frequency, hematuria and urgency.  Musculoskeletal:  Negative for back pain, joint pain and myalgias.  Skin:  Negative  for rash.  Neurological:  Positive for headaches. Negative for dizziness, tingling, focal weakness, seizures and weakness.  Endo/Heme/Allergies:  Does not bruise/bleed easily.  Psychiatric/Behavioral:  Negative for depression and suicidal ideas. The patient does not have insomnia.     No Known  Allergies   Past Medical History:  Diagnosis Date   Cancer ([place])    Cataract    Diabetes mellitus without complication ([place])    Hyperlipidemia    Hypertension    Hypothyroidism    doctor's keeping an eye on thyroid    Lung cancer [facility])      Past Surgical History:  Procedure Laterality Date   ABDOMINAL HYSTERECTOMY     IR CV LINE INJECTION  09/28/2021   PORTA CATH INSERTION N/A 11/30/2020   Procedure: PORTA CATH INSERTION;  Surgeon: Algernon Huxley, MD;  Location: [place] [facility];  Service: Cardiovascular;  Laterality: N/A;   VIDEO BRONCHOSCOPY WITH ENDOBRONCHIAL NAVIGATION N/A 10/21/2020   Procedure: VIDEO BRONCHOSCOPY WITH ENDOBRONCHIAL NAVIGATION;  Surgeon: Ottie Glazier, MD;  Location: [facility] ORS;  Service: Thoracic;  Laterality: N/A;   VIDEO BRONCHOSCOPY WITH ENDOBRONCHIAL ULTRASOUND N/A 10/21/2020   Procedure: VIDEO BRONCHOSCOPY WITH ENDOBRONCHIAL ULTRASOUND;  Surgeon: Ottie Glazier, MD;  Location: [facility] ORS;  Service: Thoracic;  Laterality: N/A;    Social History   Socioeconomic History   Marital status: Single    Spouse name: Not on file   Number of children: Not on file   Years of education: Not on file   Highest education level: Not on file  Occupational History   Not on file  Tobacco Use   Smoking status: Never   Smokeless tobacco: Never  Vaping Use   Vaping Use: Never used  Substance and Sexual Activity   Alcohol use: No   Drug use: No   Sexual activity: Not Currently  Other Topics Concern   Not on file  Social History Narrative   Not on file   Social Determinants of Health   Financial Resource Strain: Not on file  Food Insecurity: Food Insecurity Present   Worried About [place] in the Last Year: Sometimes true   Ran Out of Food in the Last Year: Sometimes true  Transportation Needs: No Transportation Needs   Lack of Transportation (Medical): No   Lack of Transportation (Non-Medical): No  Physical Activity: Not on file  Stress: Not  on file  Social Connections: Not on file  Intimate Partner Violence: Not on file    Family History  Problem Relation Age of Onset   Cancer Mother        breast   Hypertension Mother    Breast cancer Mother 49   Heart disease Father    Hyperlipidemia Brother    Heart disease Brother      Current Outpatient Medications:    dexamethasone (DECADRON) 4 MG tablet, Take 1 tablet (4 mg total) by mouth 2 (two) times daily with a meal., Disp: 60 tablet, Rfl: 1   fluticasone (FLONASE) 50 MCG/ACT nasal spray, Place 2 sprays into both nostrils daily., Disp: 16 g, Rfl: 5   furosemide (LASIX) 20 MG tablet, Take 1 tablet (20 mg total) by mouth daily., Disp: 60 tablet, Rfl: 0   lansoprazole (PREVACID) 30 MG capsule, Take 1 capsule (30 mg total) by mouth daily., Disp: 30 capsule, Rfl: 1   losartan (COZAAR) 25 MG tablet, Take 25 mg by mouth daily., Disp: , Rfl:    metFORMIN (GLUCOPHAGE-XR) 500 MG 24 hr tablet, Take  by mouth., Disp: , Rfl:    potassium chloride SA (KLOR-CON M) 20 MEQ tablet, Take 2 tablets (40 mEq total) by mouth daily., Disp: 7 tablet, Rfl: 0   traZODone (DESYREL) 50 MG tablet, Take by mouth., Disp: , Rfl:    lidocaine-prilocaine (EMLA) cream, Apply 1 application. topically as needed. Apply small amount to port site at least 1 hour prior to it being accessed, cover with plastic wrap, Disp: 30 g, Rfl: 1   LORazepam (ATIVAN) 0.5 MG tablet, Take 1 tablet (0.5 mg total) by mouth every 6 (six) hours as needed (Nausea or vomiting). (Patient not taking: Reported on 09/28/2021), Disp: 30 tablet, Rfl: 0   mirtazapine (REMERON) 15 MG tablet, Take 15 mg by mouth at bedtime. (Patient not taking: Reported on 12/21/2021), Disp: , Rfl:    naloxone (NARCAN) nasal spray 4 mg/0.1 mL, , Disp: , Rfl:    ondansetron (ZOFRAN) 8 MG tablet, Take 1 tablet (8 mg total) by mouth 2 (two) times daily as needed for refractory nausea / vomiting. Start on day 3 after carboplatin chemo. (Patient not taking: Reported on  10/19/2021), Disp: 30 tablet, Rfl: 1   oxycodone (ROXICODONE) 30 MG immediate release tablet, Take 1 tablet (30 mg total) by mouth every 4 (four) hours as needed for pain. (Patient not taking: Reported on 04/05/2022), Disp: 180 tablet, Rfl: 0   polyethylene glycol (MIRALAX / GLYCOLAX) 17 g packet, Take 17 g by mouth daily. (Patient not taking: Reported on 02/22/2022), Disp: , Rfl:    prochlorperazine (COMPAZINE) 10 MG tablet, Take 1 tablet (10 mg total) by mouth every 6 (six) hours as needed (Nausea or vomiting). (Patient not taking: Reported on 10/19/2021), Disp: 30 tablet, Rfl: 1 No current facility-administered medications for this visit.  Facility-Administered Medications Ordered in Other Visits:    sodium chloride flush (NS) 0.9 % injection 10 mL, 10 mL, Intravenous, PRN, Sindy Guadeloupe, MD, 10 mL at 12/15/20 0850  Physical exam:  Vitals:   04/05/22 0858  BP: 109/75  Pulse: 85  Resp: 16  Temp: (!) 96.8 F (36 C)  SpO2: 100%  Weight: 154 lb 6.4 oz (70 kg)   Physical Exam Constitutional:      General: She is not in acute distress. Cardiovascular:     Rate and Rhythm: Normal rate and regular rhythm.     Heart sounds: Normal heart sounds.  Pulmonary:     Effort: Pulmonary effort is normal.     Breath sounds: Normal breath sounds.  Abdominal:     General: Bowel sounds are normal.     Palpations: Abdomen is soft.  Skin:    General: Skin is warm and dry.  Neurological:     Mental Status: She is alert and oriented to person, place, and time.        Latest Ref Rng & Units 04/05/2022    8:43 AM  CMP  Glucose 70 - 99 mg/dL 175    BUN 8 - 23 mg/dL 13    Creatinine 0.44 - 1.00 mg/dL 0.68    Sodium 135 - 145 mmol/L 136    Potassium 3.5 - 5.1 mmol/L 3.3    Chloride 98 - 111 mmol/L 105    CO2 22 - 32 mmol/L 22    Calcium 8.9 - 10.3 mg/dL 9.4    Total Protein 6.5 - 8.1 g/dL 6.7    Total Bilirubin 0.3 - 1.2 mg/dL 1.0    Alkaline Phos 38 - 126 U/L 47    AST  15 - 41 U/L 18    ALT 0  - 44 U/L 9        Latest Ref Rng & Units 04/05/2022    8:43 AM  CBC  WBC 4.0 - 10.5 K/uL 4.7    Hemoglobin 12.0 - 15.0 g/dL 13.0    Hematocrit 36.0 - 46.0 % 38.5    Platelets 150 - 400 K/uL 208       Assessment and plan- Patient is a 71 y.o. female with history of metastatic small cell lung cancer with liver and brain metastases here for on treatment assessment prior to next cycle of Tecentriq  Counts okay to proceed with cycle 18 of maintenance Tecentriq today.  I will see her back in 3 weeks for cycle 18.  We will obtain CT chest abdomen and pelvis with contrast and MRI brain prior.  Otherwise tolerating treatment well without any significant side effects.  TSH is normal.   Visit Diagnosis 1. Small cell carcinoma of lung metastatic to liver ([place])   2. Brain mass   3. Encounter for antineoplastic immunotherapy      Dr. Randa Evens, MD, MPH [facility] at [facility] [phone] 04/05/2022 1:37 PM

## 2022-04-05 NOTE — Progress Notes (Signed)
Pt states that she experiences a "light headache" and will not last long; does not feel the same as the headache that lasted a whole week; feels discomfort.

## 2022-04-20 ENCOUNTER — Ambulatory Visit
Admission: RE | Admit: 2022-04-20 | Discharge: 2022-04-20 | Disposition: A | Discharge status: Home / Self Care | Payer: Medicare HMO | Source: Ambulatory Visit | Attending: Oncology | Admitting: Oncology

## 2022-04-20 DIAGNOSIS — C3490 Malignant neoplasm of unspecified part of unspecified bronchus or lung: Secondary | ICD-10-CM | POA: Diagnosis present

## 2022-04-20 DIAGNOSIS — G9389 Other specified disorders of brain: Secondary | ICD-10-CM | POA: Diagnosis present

## 2022-04-20 DIAGNOSIS — C787 Secondary malignant neoplasm of liver and intrahepatic bile duct: Secondary | ICD-10-CM | POA: Diagnosis present

## 2022-04-20 MED ORDER — GADOBUTROL 1 MMOL/ML IV SOLN
7.0000 mL | Freq: Once | INTRAVENOUS | Status: AC | PRN
  Administered 2022-04-20: 7 mL via INTRAVENOUS

## 2022-04-20 MED ORDER — IOHEXOL 300 MG/ML  SOLN
100.0000 mL | Freq: Once | INTRAMUSCULAR | Status: AC | PRN
  Administered 2022-04-20: 100 mL via INTRAVENOUS

## 2022-04-21 ENCOUNTER — Telehealth: Payer: Self-pay | Admitting: *Deleted

## 2022-04-21 ENCOUNTER — Telehealth: Payer: Self-pay | Admitting: Internal Medicine

## 2022-04-21 ENCOUNTER — Other Ambulatory Visit: Payer: Self-pay | Admitting: *Deleted

## 2022-04-21 MED ORDER — APIXABAN 5 MG PO TABS: 5.0000 mg | ORAL_TABLET | Freq: Two times a day (BID) | ORAL | 1 refills | Status: DC

## 2022-04-21 NOTE — Telephone Encounter (Signed)
I called 2 numbers for the pt and 1 number was no longer workig, the other number says that pt full voicemail and can leave a message. I called her daughter and she was with pt. And put her on the phone. I explained to her that her scan today showed a small clot in her renal vein of left kidney. Dr. Rogue Bussing is the on call doctor and dr . Janese Banks is off this week. Dr. Rogue Bussing said that pt needs to take eliquis 5 mg bid and I sent the rx into pharmacy . I told pt that it is a blood thinner and she will need to take it twice a day. Because it is blood thinner she needs to be aware that if she has a fall and hit her head, or if she had a hard fall like on her hip or leg - she should got to ER to be checked out because she can have internal bleeding that we would not know without scanning her. She understands. Also it can be expensive for some people based on the insurance so if it is costly we will try to help her depending on what her insurance is. Pt understands.

## 2022-04-21 NOTE — Telephone Encounter (Signed)
CT CAP- on left renal vein thrombosis- "small but real as per radiology". Pt will need anti-coagulation if no contraindications.   Will discuss with Judeen Hammans; and prescribe eliquis.

## 2022-04-22 NOTE — Telephone Encounter (Signed)
I spoke to the pt. Yest and sent the rx for eliquis 5 mg bid and went over the things to look out for and there is phone note about it

## 2022-04-26 ENCOUNTER — Inpatient Hospital Stay: Payer: Medicare HMO

## 2022-04-26 ENCOUNTER — Inpatient Hospital Stay (HOSPITAL_BASED_OUTPATIENT_CLINIC_OR_DEPARTMENT_OTHER): Payer: Medicare HMO | Admitting: Oncology

## 2022-04-26 ENCOUNTER — Inpatient Hospital Stay: Payer: Medicare HMO | Attending: Oncology

## 2022-04-26 ENCOUNTER — Encounter: Payer: Self-pay | Admitting: Oncology

## 2022-04-26 VITALS — BP 158/77 | HR 78 | Temp 98.6°F | Resp 18 | Wt 156.6 lb

## 2022-04-26 DIAGNOSIS — Z7722 Contact with and (suspected) exposure to environmental tobacco smoke (acute) (chronic): Secondary | ICD-10-CM | POA: Insufficient documentation

## 2022-04-26 DIAGNOSIS — E119 Type 2 diabetes mellitus without complications: Secondary | ICD-10-CM | POA: Diagnosis not present

## 2022-04-26 DIAGNOSIS — Z7952 Long term (current) use of systemic steroids: Secondary | ICD-10-CM | POA: Insufficient documentation

## 2022-04-26 DIAGNOSIS — Z8249 Family history of ischemic heart disease and other diseases of the circulatory system: Secondary | ICD-10-CM | POA: Insufficient documentation

## 2022-04-26 DIAGNOSIS — C787 Secondary malignant neoplasm of liver and intrahepatic bile duct: Secondary | ICD-10-CM

## 2022-04-26 DIAGNOSIS — Z79899 Other long term (current) drug therapy: Secondary | ICD-10-CM | POA: Diagnosis not present

## 2022-04-26 DIAGNOSIS — Z7901 Long term (current) use of anticoagulants: Secondary | ICD-10-CM | POA: Insufficient documentation

## 2022-04-26 DIAGNOSIS — C3412 Malignant neoplasm of upper lobe, left bronchus or lung: Secondary | ICD-10-CM | POA: Insufficient documentation

## 2022-04-26 DIAGNOSIS — I823 Embolism and thrombosis of renal vein: Secondary | ICD-10-CM

## 2022-04-26 DIAGNOSIS — M7989 Other specified soft tissue disorders: Secondary | ICD-10-CM | POA: Insufficient documentation

## 2022-04-26 DIAGNOSIS — R590 Localized enlarged lymph nodes: Secondary | ICD-10-CM | POA: Diagnosis not present

## 2022-04-26 DIAGNOSIS — Z5112 Encounter for antineoplastic immunotherapy: Secondary | ICD-10-CM | POA: Diagnosis present

## 2022-04-26 DIAGNOSIS — Z803 Family history of malignant neoplasm of breast: Secondary | ICD-10-CM | POA: Insufficient documentation

## 2022-04-26 DIAGNOSIS — C3490 Malignant neoplasm of unspecified part of unspecified bronchus or lung: Secondary | ICD-10-CM

## 2022-04-26 DIAGNOSIS — I700 Atherosclerosis of aorta: Secondary | ICD-10-CM | POA: Insufficient documentation

## 2022-04-26 DIAGNOSIS — Z8349 Family history of other endocrine, nutritional and metabolic diseases: Secondary | ICD-10-CM | POA: Diagnosis not present

## 2022-04-26 DIAGNOSIS — C7931 Secondary malignant neoplasm of brain: Secondary | ICD-10-CM | POA: Insufficient documentation

## 2022-04-26 DIAGNOSIS — Z87891 Personal history of nicotine dependence: Secondary | ICD-10-CM | POA: Diagnosis not present

## 2022-04-26 DIAGNOSIS — E049 Nontoxic goiter, unspecified: Secondary | ICD-10-CM | POA: Diagnosis not present

## 2022-04-26 DIAGNOSIS — R0789 Other chest pain: Secondary | ICD-10-CM | POA: Insufficient documentation

## 2022-04-26 LAB — CBC WITH DIFFERENTIAL/PLATELET
Abs Immature Granulocytes: 0.01 10*3/uL (ref 0.00–0.07)
Basophils Absolute: 0 10*3/uL (ref 0.0–0.1)
Basophils Relative: 0 %
Eosinophils Absolute: 0.1 10*3/uL (ref 0.0–0.5)
Eosinophils Relative: 2 %
HCT: 35.2 % — ABNORMAL LOW (ref 36.0–46.0)
Hemoglobin: 11.5 g/dL — ABNORMAL LOW (ref 12.0–15.0)
Immature Granulocytes: 0 %
Lymphocytes Relative: 17 %
Lymphs Abs: 0.7 10*3/uL (ref 0.7–4.0)
MCH: 30.7 pg (ref 26.0–34.0)
MCHC: 32.7 g/dL (ref 30.0–36.0)
MCV: 93.9 fL (ref 80.0–100.0)
Monocytes Absolute: 0.4 10*3/uL (ref 0.1–1.0)
Monocytes Relative: 11 %
Neutro Abs: 2.7 10*3/uL (ref 1.7–7.7)
Neutrophils Relative %: 70 %
Platelets: 221 10*3/uL (ref 150–400)
RBC: 3.75 MIL/uL — ABNORMAL LOW (ref 3.87–5.11)
RDW: 14.6 % (ref 11.5–15.5)
WBC: 3.9 10*3/uL — ABNORMAL LOW (ref 4.0–10.5)
nRBC: 0 % (ref 0.0–0.2)

## 2022-04-26 LAB — COMPREHENSIVE METABOLIC PANEL
ALT: 13 U/L (ref 0–44)
AST: 17 U/L (ref 15–41)
Albumin: 3.6 g/dL (ref 3.5–5.0)
Alkaline Phosphatase: 64 U/L (ref 38–126)
Anion gap: 5 (ref 5–15)
BUN: 12 mg/dL (ref 8–23)
CO2: 24 mmol/L (ref 22–32)
Calcium: 9.3 mg/dL (ref 8.9–10.3)
Chloride: 113 mmol/L — ABNORMAL HIGH (ref 98–111)
Creatinine, Ser: 0.54 mg/dL (ref 0.44–1.00)
GFR, Estimated: >60 mL/min (ref >60)
Glucose, Bld: 105 mg/dL — ABNORMAL HIGH (ref 70–99)
Potassium: 3.7 mmol/L (ref 3.5–5.1)
Sodium: 142 mmol/L (ref 135–145)
Total Bilirubin: 0.8 mg/dL (ref 0.3–1.2)
Total Protein: 6.5 g/dL (ref 6.5–8.1)

## 2022-04-26 MED ORDER — SODIUM CHLORIDE 0.9 % IV SOLN
1200.0000 mg | Freq: Once | INTRAVENOUS | Status: AC
  Administered 2022-04-26: 1200 mg via INTRAVENOUS
  Filled 2022-04-26: qty 20

## 2022-04-26 MED ORDER — SODIUM CHLORIDE 0.9 % IV SOLN
Freq: Once | INTRAVENOUS | Status: AC
  Filled 2022-04-26: qty 250

## 2022-04-26 MED ORDER — HEPARIN SOD (PORK) LOCK FLUSH 100 UNIT/ML IV SOLN
500.0000 [IU] | Freq: Once | INTRAVENOUS | Status: AC | PRN
  Administered 2022-04-26: 500 [IU]
  Filled 2022-04-26: qty 5

## 2022-04-26 NOTE — Progress Notes (Signed)
Hematology/Oncology Consult note [facility]  Telephone:([phone] [phone]  Patient Care Team: Leonel Ramsay, MD as PCP - General (Infectious Diseases) Telford Nab, RN as Oncology Nurse Navigator Sindy Guadeloupe, MD as Consulting Physician (Hematology and Oncology)   Name of the patient: Doris Johnson  [id number]  03-05-1951   Date of visit: 04/26/22  Diagnosis- extensive stage small cell lung cancer with liver and brain metastases  Chief complaint/ Reason for visit-on treatment assessment prior to cycle 18 of maintenance Tecentriq and to discuss CT scan results and further management  Heme/Onc history: Patient is a 71 year old female with a remote history of smoking in her teenage years and exposure to passive smoking.  She has been having ongoing midsternal pain which has been growing since the last 6 to 7 months.  She had been to urgent care as well in the past.  She finally had a CT chest with contrast done in October 2021 which showed a large mass in the left side of the mediastinum measuring 6.7 x 6.2 x 6.1 cm causing severe compression of the left main pulmonary artery and around the aortic arch in the left subclavian artery.  Enlarged thyroid gland.  Left upper lobe nodule 1 x 0.7 cm.  She then underwent bronchoscopy with Dr.Aleskerov left upper lobe biopsy was nondiagnostic.  However station 4, 7 and 10 L lymph nodes were consistent with metastatic small cell carcinoma.   PET CT scan showed enlarged level 3 cervical lymph node 2.2 cm that was hypermetabolic with an SUV of 9.7.  Large central 7.3 cm AP window mass.  5.7 cm left liver mass.  MRI brain with and without contrast showed 2 small foci of enhancement in the right cerebellar hemisphere and right temporal lobe without mass-effect or edema as well concerning for brain metastases   Patient had 2 cycles of carbo etoposide chemotherapy along with Tecentriq and subsequent scan showed  no significant improvement in the primary lung mass or liver mass.  She received radiation treatment to her primary lung mass and also seen by [facility] for second opinion.  Her pathology was reviewed at [facility] and reported as possible neuroendocrine neoplasm But stated that small cell carcinoma cannot be excluded but not definitely diagnostic for it.  However [facility] pathology feels that this is consistent with small cell lung cancer.  She has completed 4 cycles of carbo etoposide Tecentriq chemotherapy and is currently on maintenance Tecentriq.  Repeat liver biopsy was also performed and was consistent with small cell lung cancer   Patient has had stable disease on maintenance Tecentriq so far and she has been on single agent Tecentriq since April 2022    new brain mets in march 2023. She completed WBRT      Interval history-patient is doing well overall.  Appetite and weight have remained stable.  Chest wall pain is presently under good control.  Leg swelling is under control with compression stockings.  ECOG PS- 1 Pain scale- 0   Review of systems- Review of Systems  Constitutional:  Negative for chills, fever, malaise/fatigue and weight loss.  HENT:  Negative for congestion, ear discharge and nosebleeds.   Eyes:  Negative for blurred vision.  Respiratory:  Negative for cough, hemoptysis, sputum production, shortness of breath and wheezing.   Cardiovascular:  Negative for chest pain, palpitations, orthopnea and claudication.  Gastrointestinal:  Negative for abdominal pain, blood in stool, constipation, diarrhea, heartburn, melena, nausea and vomiting.  Genitourinary:  Negative  for dysuria, flank pain, frequency, hematuria and urgency.  Musculoskeletal:  Negative for back pain, joint pain and myalgias.  Skin:  Negative for rash.  Neurological:  Negative for dizziness, tingling, focal weakness, seizures, weakness and headaches.  Endo/Heme/Allergies:  Does not bruise/bleed easily.   Psychiatric/Behavioral:  Negative for depression and suicidal ideas. The patient does not have insomnia.       No Known Allergies   Past Medical History:  Diagnosis Date   Cancer ([place])    Cataract    Diabetes mellitus without complication ([place])    Hyperlipidemia    Hypertension    Hypothyroidism    doctor's keeping an eye on thyroid    Lung cancer [facility])      Past Surgical History:  Procedure Laterality Date   ABDOMINAL HYSTERECTOMY     IR CV LINE INJECTION  09/28/2021   PORTA CATH INSERTION N/A 11/30/2020   Procedure: PORTA CATH INSERTION;  Surgeon: Algernon Huxley, MD;  Location: [place] [facility];  Service: Cardiovascular;  Laterality: N/A;   VIDEO BRONCHOSCOPY WITH ENDOBRONCHIAL NAVIGATION N/A 10/21/2020   Procedure: VIDEO BRONCHOSCOPY WITH ENDOBRONCHIAL NAVIGATION;  Surgeon: Ottie Glazier, MD;  Location: [facility];  Service: Thoracic;  Laterality: N/A;   VIDEO BRONCHOSCOPY WITH ENDOBRONCHIAL ULTRASOUND N/A 10/21/2020   Procedure: VIDEO BRONCHOSCOPY WITH ENDOBRONCHIAL ULTRASOUND;  Surgeon: Ottie Glazier, MD;  Location: [facility];  Service: Thoracic;  Laterality: N/A;    Social History   Socioeconomic History   Marital status: Single    Spouse name: Not on file   Number of children: Not on file   Years of education: Not on file   Highest education level: Not on file  Occupational History   Not on file  Tobacco Use   Smoking status: Never   Smokeless tobacco: Never  Vaping Use   Vaping Use: Never used  Substance and Sexual Activity   Alcohol use: No   Drug use: No   Sexual activity: Not Currently  Other Topics Concern   Not on file  Social History Narrative   Not on file   Social Determinants of Health   Financial Resource Strain: Not on file  Food Insecurity: Food Insecurity Present (07/08/2021)   Hunger Vital Sign    Worried About Running Out of Food in the Last Year: Sometimes true    Ran Out of Food in the Last Year: Sometimes true  Transportation  Needs: No Transportation Needs (04/05/2022)   PRAPARE - Hydrologist (Medical): No    Lack of Transportation (Non-Medical): No  Physical Activity: Not on file  Stress: Not on file  Social Connections: Not on file  Intimate Partner Violence: Not on file    Family History  Problem Relation Age of Onset   Cancer Mother        breast   Hypertension Mother    Breast cancer Mother 33   Heart disease Father    Hyperlipidemia Brother    Heart disease Brother      Current Outpatient Medications:    apixaban (ELIQUIS) 5 MG TABS tablet, Take 1 tablet (5 mg total) by mouth 2 (two) times daily., Disp: 60 tablet, Rfl: 1   dexamethasone (DECADRON) 4 MG tablet, Take 1 tablet (4 mg total) by mouth 2 (two) times daily with a meal., Disp: 60 tablet, Rfl: 1   fluticasone (FLONASE) 50 MCG/ACT nasal spray, Place 2 sprays into both nostrils daily., Disp: 16 g, Rfl: 5   furosemide (LASIX) 20  MG tablet, Take 1 tablet (20 mg total) by mouth daily., Disp: 60 tablet, Rfl: 0   lidocaine-prilocaine (EMLA) cream, Apply 1 application. topically as needed. Apply small amount to port site at least 1 hour prior to it being accessed, cover with plastic wrap, Disp: 30 g, Rfl: 1   losartan (COZAAR) 25 MG tablet, Take 25 mg by mouth daily., Disp: , Rfl:    metFORMIN (GLUCOPHAGE-XR) 500 MG 24 hr tablet, Take by mouth., Disp: , Rfl:    potassium chloride SA (KLOR-CON M) 20 MEQ tablet, Take 2 tablets (40 mEq total) by mouth daily., Disp: 7 tablet, Rfl: 0   lansoprazole (PREVACID) 30 MG capsule, Take 1 capsule (30 mg total) by mouth daily. (Patient not taking: Reported on 04/26/2022), Disp: 30 capsule, Rfl: 1   LORazepam (ATIVAN) 0.5 MG tablet, Take 1 tablet (0.5 mg total) by mouth every 6 (six) hours as needed (Nausea or vomiting). (Patient not taking: Reported on 09/28/2021), Disp: 30 tablet, Rfl: 0   mirtazapine (REMERON) 15 MG tablet, Take 15 mg by mouth at bedtime. (Patient not taking: Reported  on 12/21/2021), Disp: , Rfl:    naloxone (NARCAN) nasal spray 4 mg/0.1 mL, , Disp: , Rfl:    ondansetron (ZOFRAN) 8 MG tablet, Take 1 tablet (8 mg total) by mouth 2 (two) times daily as needed for refractory nausea / vomiting. Start on day 3 after carboplatin chemo. (Patient not taking: Reported on 10/19/2021), Disp: 30 tablet, Rfl: 1   oxycodone (ROXICODONE) 30 MG immediate release tablet, Take 1 tablet (30 mg total) by mouth every 4 (four) hours as needed for pain. (Patient not taking: Reported on 04/05/2022), Disp: 180 tablet, Rfl: 0   polyethylene glycol (MIRALAX / GLYCOLAX) 17 g packet, Take 17 g by mouth daily. (Patient not taking: Reported on 02/22/2022), Disp: , Rfl:    prochlorperazine (COMPAZINE) 10 MG tablet, Take 1 tablet (10 mg total) by mouth every 6 (six) hours as needed (Nausea or vomiting). (Patient not taking: Reported on 10/19/2021), Disp: 30 tablet, Rfl: 1   traZODone (DESYREL) 50 MG tablet, Take by mouth. (Patient not taking: Reported on 04/26/2022), Disp: , Rfl:  No current facility-administered medications for this visit.  Facility-Administered Medications Ordered in Other Visits:    sodium chloride flush (NS) 0.9 % injection 10 mL, 10 mL, Intravenous, PRN, Sindy Guadeloupe, MD, 10 mL at 12/15/20 0850  Physical exam:  Vitals:   04/26/22 0906  BP: (!) 158/77  Pulse: 78  Resp: 18  Temp: 98.6 F (37 C)  SpO2: 100%  Weight: 156 lb 9.6 oz (71 kg)   Physical Exam Constitutional:      General: She is not in acute distress. Cardiovascular:     Rate and Rhythm: Normal rate and regular rhythm.     Heart sounds: Normal heart sounds.  Pulmonary:     Effort: Pulmonary effort is normal.     Breath sounds: Normal breath sounds.  Skin:    General: Skin is warm and dry.  Neurological:     Mental Status: She is alert and oriented to person, place, and time.         Latest Ref Rng & Units 04/26/2022    8:26 AM  CMP  Glucose 70 - 99 mg/dL 105   BUN 8 - 23 mg/dL 12   Creatinine  0.44 - 1.00 mg/dL 0.54   Sodium 135 - 145 mmol/L 142   Potassium 3.5 - 5.1 mmol/L 3.7   Chloride 98 - 111  mmol/L 113   CO2 22 - 32 mmol/L 24   Calcium 8.9 - 10.3 mg/dL 9.3   Total Protein 6.5 - 8.1 g/dL 6.5   Total Bilirubin 0.3 - 1.2 mg/dL 0.8   Alkaline Phos 38 - 126 U/L 64   AST 15 - 41 U/L 17   ALT 0 - 44 U/L 13       Latest Ref Rng & Units 04/26/2022    8:26 AM  CBC  WBC 4.0 - 10.5 K/uL 3.9   Hemoglobin 12.0 - 15.0 g/dL 11.5   Hematocrit 36.0 - 46.0 % 35.2   Platelets 150 - 400 K/uL 221     No images are attached to the encounter.  CT CHEST ABDOMEN PELVIS W CONTRAST  Result Date: 04/21/2022 CLINICAL DATA:  Metastatic small cell lung cancer restaging. * Tracking Code: BO * EXAM: CT CHEST, ABDOMEN, AND PELVIS WITH CONTRAST TECHNIQUE: Multidetector CT imaging of the chest, abdomen and pelvis was performed following the standard protocol during bolus administration of intravenous contrast. RADIATION DOSE REDUCTION: This exam was performed according to the departmental dose-optimization program which includes automated exposure control, adjustment of the mA and/or kV according to patient size and/or use of iterative reconstruction technique. CONTRAST:  169mL OMNIPAQUE IOHEXOL 300 MG/ML  SOLN COMPARISON:  Multiple 12/29/2021 FINDINGS: CT CHEST FINDINGS Cardiovascular: Aortic and branch vessel atherosclerotic vascular disease. Upper normal heart size. Mediastinum/Nodes: Stable low-density centered in the AP window and tracking partially around the aortic arch and left pulmonary artery, this measures up to 3.1 cm in thickness on image 17 series 2, formerly the same when measured in the same fashion. Overall the extent and appearance is not changed and this region presumably represents treated tumor. Looking back on exams such as 09/03/2020 there is previously a large mass in this vicinity measuring up to 6.7 cm in thickness. Chronically stable multinodular thyroid prominence. Left  supraclavicular node 1.4 cm in short axis on image 3 series 2, formerly 0.6 cm. We also image the bottom of a left level IV lymph node which is probably enlarged although only partially included. Lungs/Pleura: Stable scarring and volume loss in the left upper lobe along with paramediastinal fibrosis not significantly changed. Small bilateral subpleural nodules measuring 5 mm or less in diameter not substantially changed. There is also a 3 mm right upper lobe nodule which is stable on image 33 series 4. Musculoskeletal: Thoracic spondylosis. CT ABDOMEN PELVIS FINDINGS Hepatobiliary: The hypodense lesion in segment 4 of the liver is adjacent to the gallbladder and roughly similar in density to the gallbladder, making difficulty mildly problematic, this lesion appears to measure about 3.2 by 1.3 cm on image 37 series 5, previously 3.3 by 1.4 cm when measured in the same fashion. There is some mild steatosis adjacent to the falciform ligament. Stable 1.5 cm diffusely enhancing lesion in the right hepatic lobe on image 44 of series 2 with associated delayed enhancement, not previously hypermetabolic, most likely a hemangioma. Gallbladder unremarkable. Pancreas: Unremarkable Spleen: Unremarkable Adrenals/Urinary Tract: The adrenal glands and kidneys appear unremarkable. Urinary bladder unremarkable. Contrast in the urinary bladder likely represents excreted gadolinium from the earlier brain MRI. Stomach/Bowel: Unremarkable Vascular/Lymphatic: On images 69-87 of series 5, there is a filling defect in a left renal vein tributary extending from the left kidney lower pole compatible with left renal vein thrombosis affecting this tributary. This was not present on the 12/29/2021 exam. I do not see a discrete left renal mass. Atherosclerosis is present, including aortoiliac atherosclerotic  disease. No pathologic adenopathy in the abdomen/pelvis. Reproductive: Uterus absent.  Adnexa unremarkable. Other: No supplemental  non-categorized findings. Musculoskeletal: 6 mm degenerative anterolisthesis at L4-5. Lumbar spondylosis and degenerative disc disease result in substantial bilateral foraminal impingement at L4-5 and L5-S1 and there is mild central narrowing of the thecal sac at L4-5. IMPRESSION: 1. Left renal vein thrombus, in a left kidney lower pole renal vein tributary, as shown on images 108 through 112 of series 6. No tumor or underlying lesion in the left kidney is observed. 2. Stable appearance of abnormal low density in the AP window corresponding to the site of previously treated tumor, no change from the prior exam. 3. Enlargement of a left supraclavicular lymph node, currently 1.4 cm in short axis. I suspect that the adjacent left lower neck level IV lymph node is probably enlarged as well, but it is only partially included on today's exam. 4. Several small subpleural nodules measuring 5 mm or less in size are unchanged. 5. Stable treated lesion in the liver just above the gallbladder fossa. 6. Other imaging findings of potential clinical significance: Aortic Atherosclerosis (ICD10-I70.0). Lumbar impingement at L4-5 and L5-S1. Suspected hepatic hemangioma. Results (particularly impression # 1) called via telephone by Dr. Janeece Fitting at the time of interpretation on 04/21/2022 at 1:26 pm to provider Dr. Andi Devon, who verbally acknowledged these results. Electronically Signed   By: Van Clines M.D.   On: 04/21/2022 13:30   MR Brain W Wo Contrast  Result Date: 04/21/2022 CLINICAL DATA:  Small-cell lung cancer. Headaches. Whole brain radiation therapy in 01/2022. EXAM: MRI HEAD WITHOUT AND WITH CONTRAST TECHNIQUE: Multiplanar, multiecho pulse sequences of the brain and surrounding structures were obtained without and with intravenous contrast. CONTRAST:  84mL GADAVIST GADOBUTROL 1 MMOL/ML IV SOLN COMPARISON:  Head MRI 01/25/2022 FINDINGS: Brain: Numerous small lesions throughout the cerebrum and cerebellum are all  stable to smaller than on the prior study. The majority of these are intrinsically T1 hyperintense and demonstrate varying degrees of enhancement and chronic blood products. The largest lesions currently measure 4 mm in the right cerebellar hemisphere (series 1024, image 59) and right postcentral gyrus (series 1024, image 126). There is no associated edema, and no definite new lesions are identified. No acute infarct, midline shift, or extra-axial fluid collection is evident. The ventricles are normal in size. Vascular: Major intracranial vascular flow voids are preserved. Skull and upper cervical spine: No suspicious marrow lesion. Sinuses/Orbits: Bilateral cataract extraction. Clear paranasal sinuses. Small bilateral mastoid effusions. Other: None. IMPRESSION: Stable to decreased size of all brain metastases. No evidence of new intracranial metastases. Electronically Signed   By: Logan Bores M.D.   On: 04/21/2022 11:47     Assessment and plan- Patient is a 71 y.o. female with extensive stage small cell lung cancer with liver metastases here for on treatment assessment prior to next cycle of maintenance/palliative Tecentriq  Counts okay to proceed with cycle 19 of palliative Tecentriq today.  I will see her back in 3 weeks for cycle 19.    I have reviewed CT chest abdomen pelvis images independently and discussed findings with the patient which overall shows stable to improved disease especially in her dominant liver lesion as well as the dominant central chest wall mass.  There was new left supraclavicular adenopathy of 1.4 cm noted which I plan to keep an eye on at this time without changing any treatment I will follow this up with subsequent scans.  CT scan also showed incidental renal vein  thrombosis and patient is on Eliquis for the same which she will continue indefinitely.  Neoplasm related pain: Continue as needed oxycodone     Visit Diagnosis 1. Encounter for antineoplastic immunotherapy    2. 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 Small cell carcinoma of lung metastatic to liver [facility])      Dr. Randa Evens, MD, MPH [facility] [phone] 04/26/2022 6:47 PM

## 2022-04-26 NOTE — Patient Instructions (Signed)
[facility] [place]  Discharge Instructions: Thank you for choosing [facility] to provide your oncology and hematology care.  If you have a lab appointment with the [facility], please go directly to the [facility] and check in at the registration area.  Wear comfortable clothing and clothing appropriate for easy access to any Portacath or PICC line.   We strive to give you quality time with your provider. You may need to reschedule your appointment if you arrive late (15 or more minutes).  Arriving late affects you and other patients whose appointments are after yours.  Also, if you miss three or more appointments without notifying the office, you may be dismissed from the clinic at the provider's discretion.      For prescription refill requests, have your pharmacy contact our office and allow 72 hours for refills to be completed.    Today you received the following chemotherapy and/or immunotherapy agents TECENTRIQ      To help prevent nausea and vomiting after your treatment, we encourage you to take your nausea medication as directed.  BELOW ARE SYMPTOMS THAT SHOULD BE REPORTED IMMEDIATELY: *FEVER GREATER THAN 100.4 F (38 C) OR HIGHER *CHILLS OR SWEATING *NAUSEA AND VOMITING THAT IS NOT CONTROLLED WITH YOUR NAUSEA MEDICATION *UNUSUAL SHORTNESS OF BREATH *UNUSUAL BRUISING OR BLEEDING *URINARY PROBLEMS (pain or burning when urinating, or frequent urination) *BOWEL PROBLEMS (unusual diarrhea, constipation, pain near the anus) TENDERNESS IN MOUTH AND THROAT WITH OR WITHOUT PRESENCE OF ULCERS (sore throat, sores in mouth, or a toothache) UNUSUAL RASH, SWELLING OR PAIN  UNUSUAL VAGINAL DISCHARGE OR ITCHING   Items with * indicate a potential emergency and should be followed up as soon as possible or go to the Emergency Department if any problems should occur.  Please show the CHEMOTHERAPY ALERT CARD or IMMUNOTHERAPY ALERT CARD at check-in to  the Emergency Department and triage nurse.  Should you have questions after your visit or need to cancel or reschedule your appointment, please contact [facility] [place] [facility] [place]  [phone] and follow the prompts.  Office hours are 8:00 a.m. to 4:30 p.m. Monday - Friday. Please note that voicemails left after 4:00 p.m. may not be returned until the following business day.  We are closed weekends and major holidays. You have access to a nurse at all times for urgent questions. Please call the main number to the clinic [phone] and follow the prompts.  For any non-urgent questions, you may also contact your provider using MyChart. We now offer e-Visits for anyone 35 and older to request care online for non-urgent symptoms. For details visit [email or web address].   Also download the MyChart app! Go to the app store, search "MyChart", open the app, select [facility], and log in with your MyChart username and password.  Due to Covid, a mask is required upon entering the hospital/clinic. If you do not have a mask, one will be given to you upon arrival. For doctor visits, patients may have 1 support person aged 66 or older with them. For treatment visits, patients cannot have anyone with them due to current Covid guidelines and our immunocompromised population.   Atezolizumab injection What is this medication? ATEZOLIZUMAB (a te zoe LIZ ue mab) is a monoclonal antibody. It is used to treat bladder cancer (urothelial cancer), liver cancer, lung cancer, and melanoma. This medicine may be used for other purposes; ask your health care provider or pharmacist if you have questions. COMMON BRAND NAME(S): [employer]  What should I tell my care team before I take this medication? They need to know if you have any of these conditions: autoimmune diseases like Crohn's disease, ulcerative colitis, or lupus have had or planning to have an allogeneic stem cell transplant (uses someone else's  stem cells) history of organ transplant history of radiation to the chest nervous system problems like myasthenia gravis or Guillain-Barre syndrome an unusual or allergic reaction to atezolizumab, other medicines, foods, dyes, or preservatives pregnant or trying to get pregnant breast-feeding How should I use this medication? This medicine is for infusion into a vein. It is given by a health care professional in a hospital or clinic setting. A special MedGuide will be given to you before each treatment. Be sure to read this information carefully each time. Talk to your pediatrician regarding the use of this medicine in children. Special care may be needed. Overdosage: If you think you have taken too much of this medicine contact a poison control center or emergency room at once. NOTE: This medicine is only for you. Do not share this medicine with others. What if I miss a dose? It is important not to miss your dose. Call your doctor or health care professional if you are unable to keep an appointment. What may interact with this medication? Interactions have not been studied. This list may not describe all possible interactions. Give your health care provider a list of all the medicines, herbs, non-prescription drugs, or dietary supplements you use. Also tell them if you smoke, drink alcohol, or use illegal drugs. Some items may interact with your medicine. What should I watch for while using this medication? Your condition will be monitored carefully while you are receiving this medicine. You may need blood work done while you are taking this medicine. Do not become pregnant while taking this medicine or for at least 5 months after stopping it. Women should inform their doctor if they wish to become pregnant or think they might be pregnant. There is a potential for serious side effects to an unborn child. Talk to your health care professional or pharmacist for more information. Do not  breast-feed an infant while taking this medicine or for at least 5 months after the last dose. What side effects may I notice from receiving this medication? Side effects that you should report to your doctor or health care professional as soon as possible: allergic reactions like skin rash, itching or hives, swelling of the face, lips, or tongue black, tarry stools bloody or watery diarrhea breathing problems changes in vision chest pain or chest tightness chills facial flushing fever headache signs and symptoms of high blood sugar such as dizziness; dry mouth; dry skin; fruity breath; nausea; stomach pain; increased hunger or thirst; increased urination signs and symptoms of liver injury like dark yellow or brown urine; general ill feeling or flu-like symptoms; light-colored stools; loss of appetite; nausea; right upper belly pain; unusually weak or tired; yellowing of the eyes or skin stomach pain trouble passing urine or change in the amount of urine Side effects that usually do not require medical attention (report to your doctor or health care professional if they continue or are bothersome): bone pain cough diarrhea joint pain muscle pain muscle weakness swelling of arms or legs tiredness weight loss This list may not describe all possible side effects. Call your doctor for medical advice about side effects. You may report side effects to FDA at 1-800-FDA-1088. Where should I keep my medication? This drug  is given in a hospital or clinic and will not be stored at home. NOTE: This sheet is a summary. It may not cover all possible information. If you have questions about this medicine, talk to your doctor, pharmacist, or health care provider.  2023 Elsevier/Gold Standard (2021-10-01 00:00:00)

## 2022-05-11 IMAGING — US US EXTREM LOW VENOUS
1 series · 14 of 24 positions shown · non-contrast
Comparison: None.

CLINICAL DATA: Bilateral lower extremity edema for 2-3 weeks

EXAM:
BILATERAL LOWER EXTREMITY VENOUS DOPPLER ULTRASOUND
TECHNIQUE: Gray-scale sonography with compression, as well as color and duplex
ultrasound, were performed to evaluate the deep venous system(s)
from the level of the common femoral vein through the popliteal and
proximal calf veins.

[Series 1: us venous img lower bilat (dvt) · portal-venous · 14 of 61 slices shown]
[im 1/61]
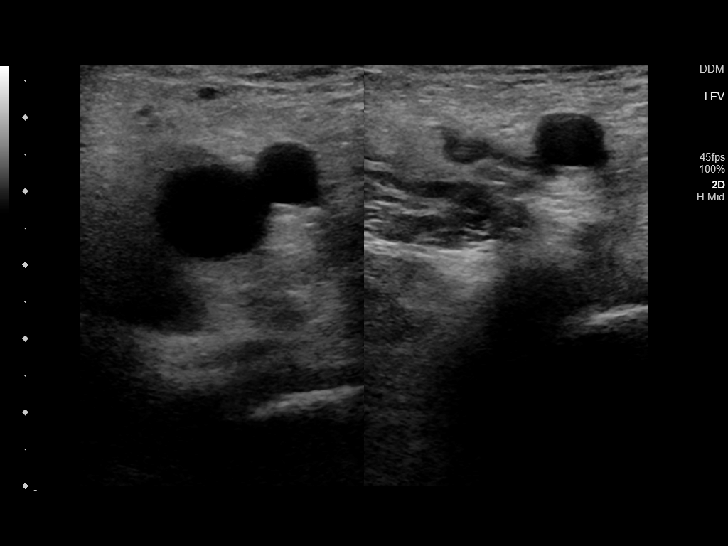
[im 6/61]
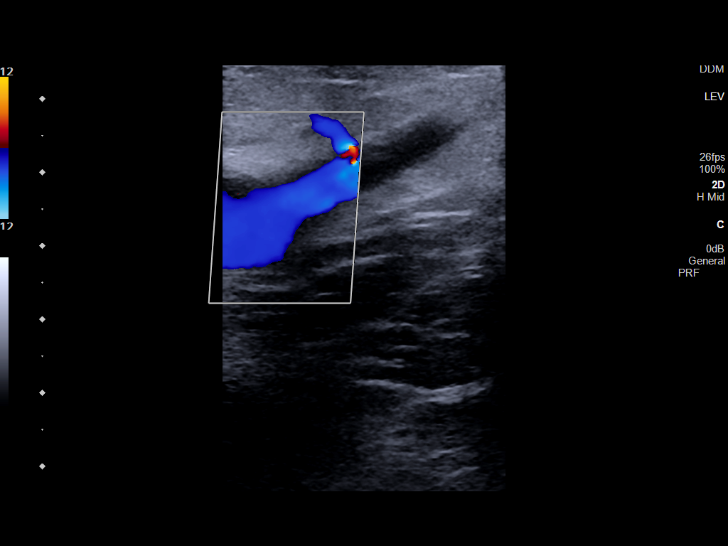
[im 11/61]
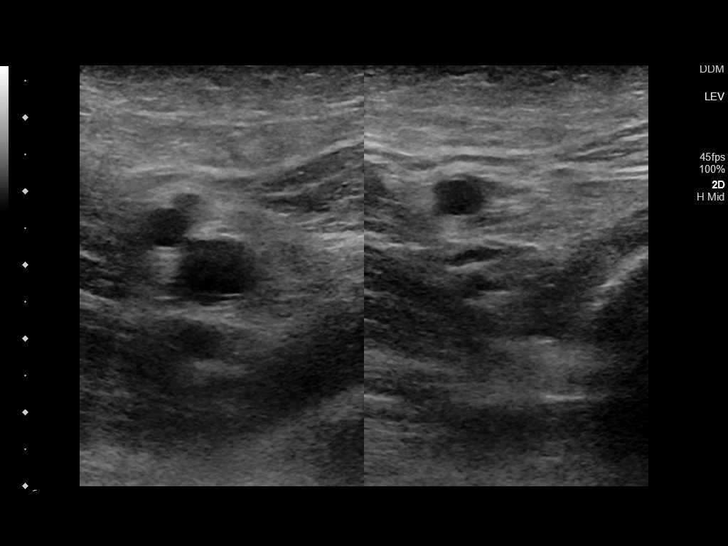
[im 16/61]
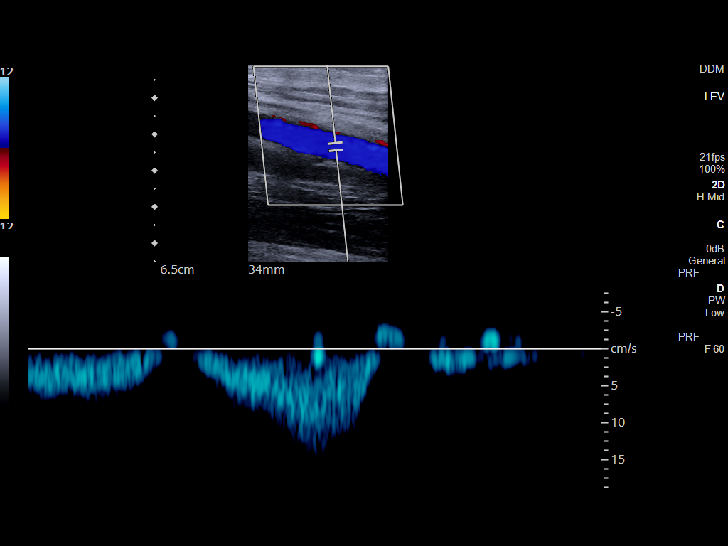
[im 19/61]
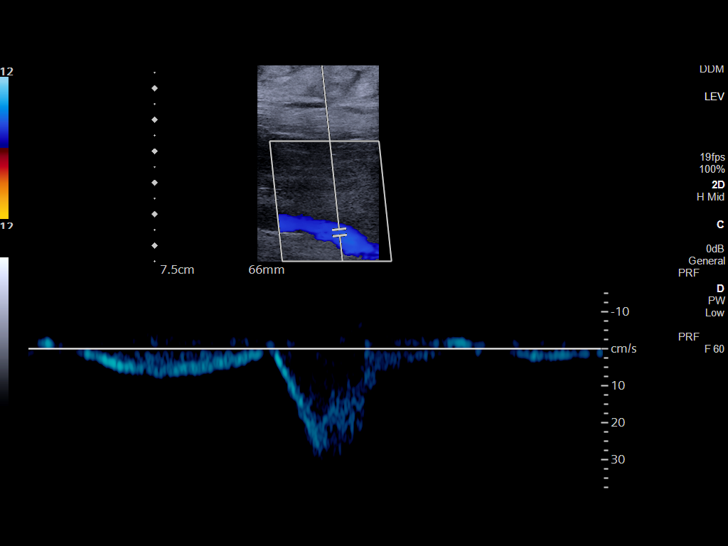
[im 24/61]
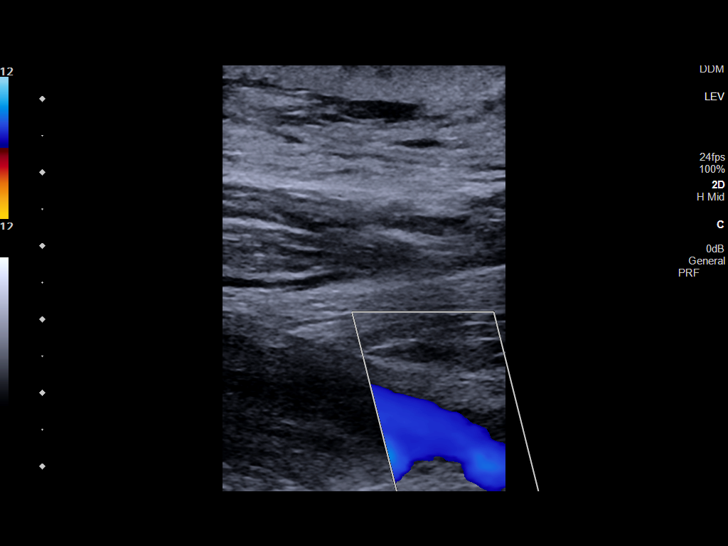
[im 29/61]
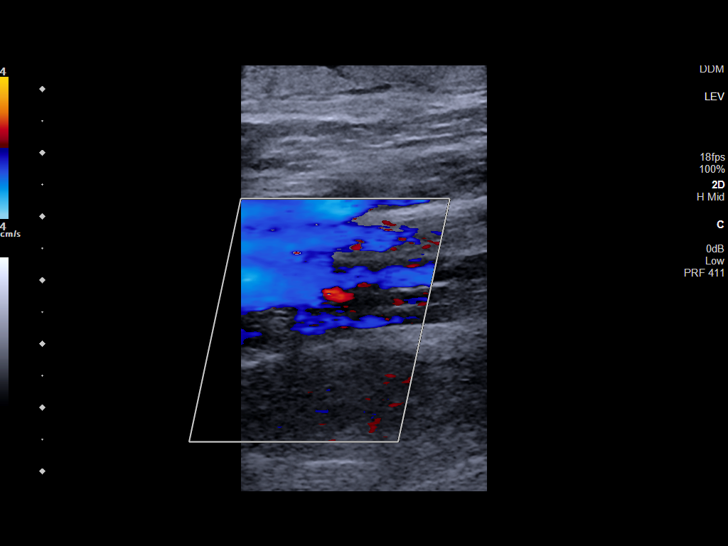
[im 32/61]
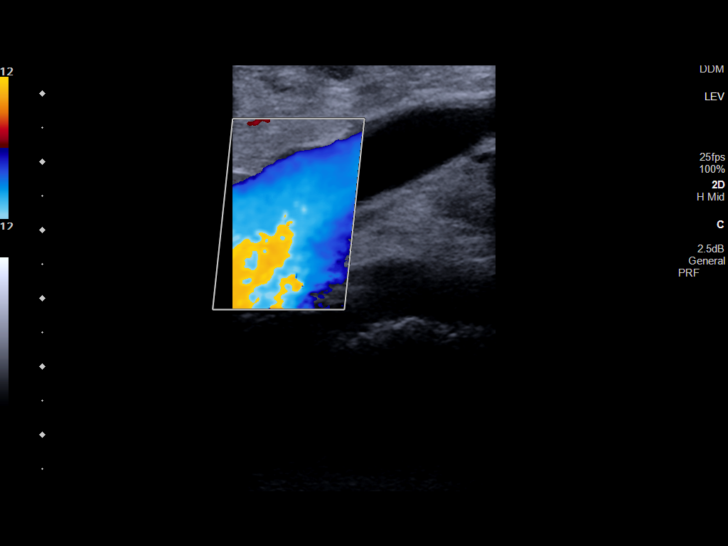
[im 37/61]
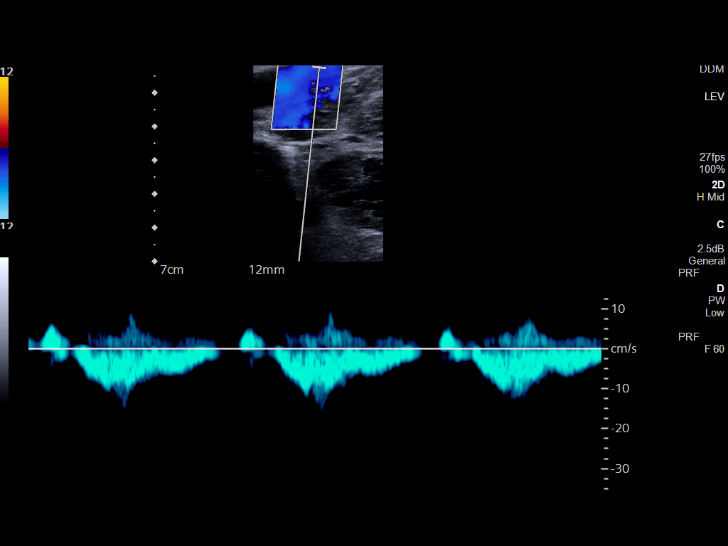
[im 42/61]
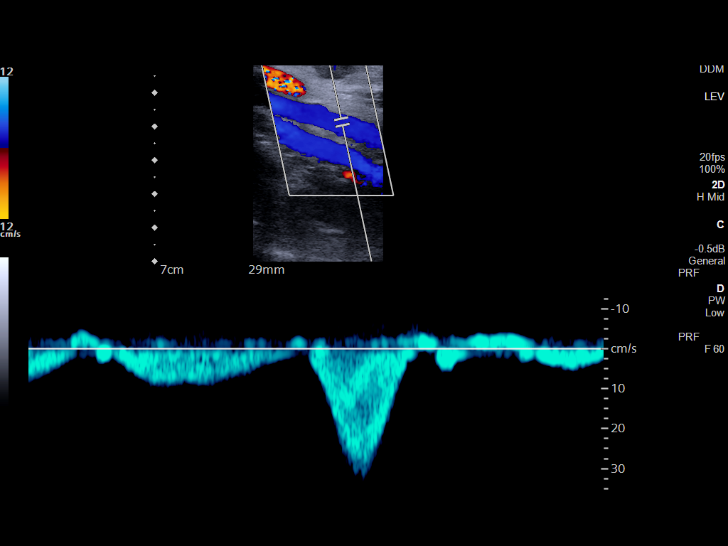
[im 47/61]
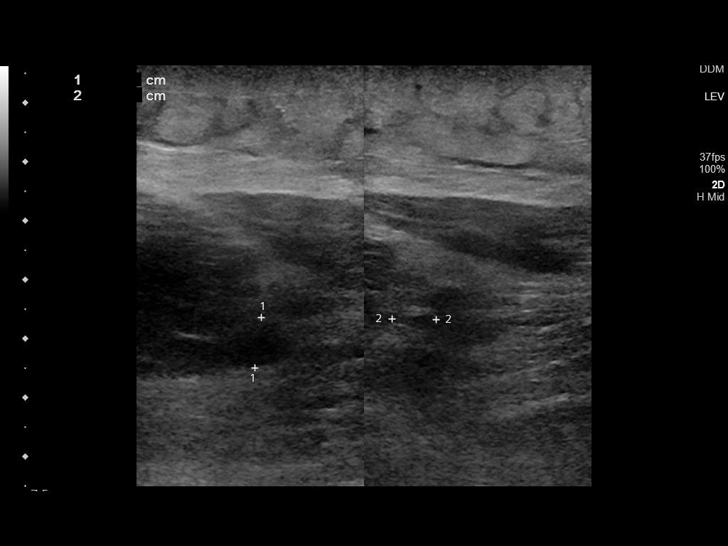
[im 50/61]
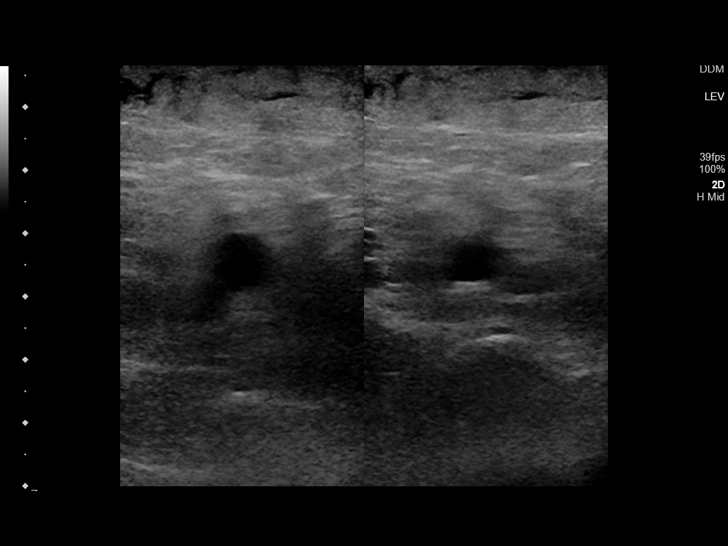
[im 55/61]
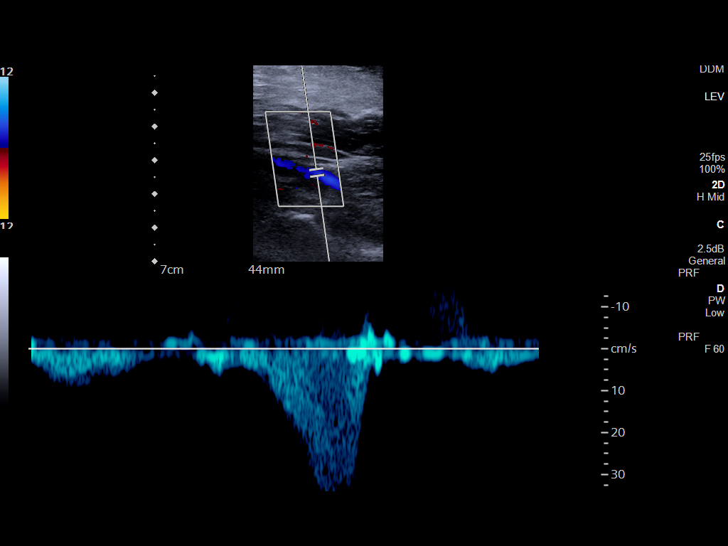
[im 61/61]
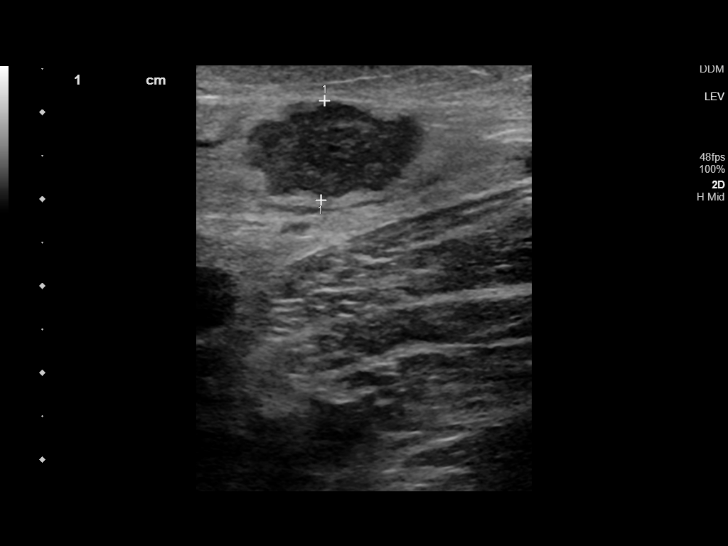

[14 of 24 positions shown; findings below may reference images not displayed]

FINDINGS: VENOUS

Normal compressibility of the common femoral, superficial femoral,
and popliteal veins, as well as the visualized calf veins.
Visualized portions of profunda femoral vein and great saphenous
vein unremarkable. No filling defects to suggest DVT on grayscale or
color Doppler imaging. Doppler waveforms show normal direction of
venous flow, normal respiratory plasticity and response to
augmentation.

OTHER

Mildly enlarged morphologically abnormal lymph nodes seen within the
inguinal regions bilaterally.

Limitations: none
IMPRESSION: 1. No lower extremity DVT.
2. Mildly enlarged morphologically abnormal lymph nodes noted with
the inguinal region bilaterally. While these may be inflammatory,
metastatic disease should also be considered given patient's history
of malignancy.

## 2022-05-18 ENCOUNTER — Inpatient Hospital Stay: Payer: Medicare HMO | Attending: Oncology

## 2022-05-18 ENCOUNTER — Inpatient Hospital Stay: Payer: Medicare HMO

## 2022-05-18 ENCOUNTER — Encounter: Payer: Self-pay | Admitting: Oncology

## 2022-05-18 ENCOUNTER — Inpatient Hospital Stay (HOSPITAL_BASED_OUTPATIENT_CLINIC_OR_DEPARTMENT_OTHER): Payer: Medicare HMO | Admitting: Oncology

## 2022-05-18 VITALS — BP 174/92 | HR 75 | Temp 97.3°F | Resp 16 | Wt 163.0 lb

## 2022-05-18 DIAGNOSIS — C3412 Malignant neoplasm of upper lobe, left bronchus or lung: Secondary | ICD-10-CM | POA: Diagnosis not present

## 2022-05-18 DIAGNOSIS — Z7901 Long term (current) use of anticoagulants: Secondary | ICD-10-CM | POA: Diagnosis not present

## 2022-05-18 DIAGNOSIS — R590 Localized enlarged lymph nodes: Secondary | ICD-10-CM | POA: Diagnosis not present

## 2022-05-18 DIAGNOSIS — E049 Nontoxic goiter, unspecified: Secondary | ICD-10-CM | POA: Insufficient documentation

## 2022-05-18 DIAGNOSIS — Z5112 Encounter for antineoplastic immunotherapy: Secondary | ICD-10-CM | POA: Diagnosis not present

## 2022-05-18 DIAGNOSIS — R0789 Other chest pain: Secondary | ICD-10-CM | POA: Insufficient documentation

## 2022-05-18 DIAGNOSIS — Z87891 Personal history of nicotine dependence: Secondary | ICD-10-CM | POA: Diagnosis not present

## 2022-05-18 DIAGNOSIS — Z803 Family history of malignant neoplasm of breast: Secondary | ICD-10-CM | POA: Diagnosis not present

## 2022-05-18 DIAGNOSIS — C787 Secondary malignant neoplasm of liver and intrahepatic bile duct: Secondary | ICD-10-CM | POA: Insufficient documentation

## 2022-05-18 DIAGNOSIS — Z8249 Family history of ischemic heart disease and other diseases of the circulatory system: Secondary | ICD-10-CM | POA: Diagnosis not present

## 2022-05-18 DIAGNOSIS — Z79899 Other long term (current) drug therapy: Secondary | ICD-10-CM | POA: Diagnosis not present

## 2022-05-18 DIAGNOSIS — C3490 Malignant neoplasm of unspecified part of unspecified bronchus or lung: Secondary | ICD-10-CM

## 2022-05-18 DIAGNOSIS — Z86718 Personal history of other venous thrombosis and embolism: Secondary | ICD-10-CM | POA: Insufficient documentation

## 2022-05-18 DIAGNOSIS — Z5941 Food insecurity: Secondary | ICD-10-CM | POA: Insufficient documentation

## 2022-05-18 DIAGNOSIS — C7931 Secondary malignant neoplasm of brain: Secondary | ICD-10-CM | POA: Diagnosis not present

## 2022-05-18 DIAGNOSIS — Z7722 Contact with and (suspected) exposure to environmental tobacco smoke (acute) (chronic): Secondary | ICD-10-CM | POA: Insufficient documentation

## 2022-05-18 DIAGNOSIS — Z8349 Family history of other endocrine, nutritional and metabolic diseases: Secondary | ICD-10-CM | POA: Insufficient documentation

## 2022-05-18 LAB — COMPREHENSIVE METABOLIC PANEL
ALT: 11 U/L (ref 0–44)
AST: 15 U/L (ref 15–41)
Albumin: 3.9 g/dL (ref 3.5–5.0)
Alkaline Phosphatase: 56 U/L (ref 38–126)
Anion gap: 7 (ref 5–15)
BUN: 12 mg/dL (ref 8–23)
CO2: 23 mmol/L (ref 22–32)
Calcium: 9.4 mg/dL (ref 8.9–10.3)
Chloride: 110 mmol/L (ref 98–111)
Creatinine, Ser: 0.5 mg/dL (ref 0.44–1.00)
GFR, Estimated: >60 mL/min (ref >60)
Glucose, Bld: 107 mg/dL — ABNORMAL HIGH (ref 70–99)
Potassium: 3.5 mmol/L (ref 3.5–5.1)
Sodium: 140 mmol/L (ref 135–145)
Total Bilirubin: 0.7 mg/dL (ref 0.3–1.2)
Total Protein: 6.9 g/dL (ref 6.5–8.1)

## 2022-05-18 LAB — CBC WITH DIFFERENTIAL/PLATELET
Abs Immature Granulocytes: 0.01 10*3/uL (ref 0.00–0.07)
Basophils Absolute: 0 10*3/uL (ref 0.0–0.1)
Basophils Relative: 0 %
Eosinophils Absolute: 0.1 10*3/uL (ref 0.0–0.5)
Eosinophils Relative: 2 %
HCT: 35.3 % — ABNORMAL LOW (ref 36.0–46.0)
Hemoglobin: 11.7 g/dL — ABNORMAL LOW (ref 12.0–15.0)
Immature Granulocytes: 0 %
Lymphocytes Relative: 21 %
Lymphs Abs: 0.7 10*3/uL (ref 0.7–4.0)
MCH: 31.5 pg (ref 26.0–34.0)
MCHC: 33.1 g/dL (ref 30.0–36.0)
MCV: 95.1 fL (ref 80.0–100.0)
Monocytes Absolute: 0.3 10*3/uL (ref 0.1–1.0)
Monocytes Relative: 9 %
Neutro Abs: 2.2 10*3/uL (ref 1.7–7.7)
Neutrophils Relative %: 68 %
Platelets: 205 10*3/uL (ref 150–400)
RBC: 3.71 MIL/uL — ABNORMAL LOW (ref 3.87–5.11)
RDW: 13.3 % (ref 11.5–15.5)
WBC: 3.2 10*3/uL — ABNORMAL LOW (ref 4.0–10.5)
nRBC: 0 % (ref 0.0–0.2)

## 2022-05-18 MED ORDER — SODIUM CHLORIDE 0.9 % IV SOLN
1200.0000 mg | Freq: Once | INTRAVENOUS | Status: AC
  Administered 2022-05-18: 1200 mg via INTRAVENOUS
  Filled 2022-05-18: qty 20

## 2022-05-18 MED ORDER — HEPARIN SOD (PORK) LOCK FLUSH 100 UNIT/ML IV SOLN
500.0000 [IU] | Freq: Once | INTRAVENOUS | Status: AC | PRN
  Filled 2022-05-18: qty 5

## 2022-05-18 MED ORDER — HEPARIN SOD (PORK) LOCK FLUSH 100 UNIT/ML IV SOLN
INTRAVENOUS | Status: AC
  Administered 2022-05-18: 500 [IU]
  Filled 2022-05-18: qty 5

## 2022-05-18 MED ORDER — SODIUM CHLORIDE 0.9% FLUSH
10.0000 mL | INTRAVENOUS | Status: DC | PRN
  Filled 2022-05-18: qty 10

## 2022-05-18 MED ORDER — SODIUM CHLORIDE 0.9 % IV SOLN
Freq: Once | INTRAVENOUS | Status: AC
  Filled 2022-05-18: qty 250

## 2022-05-18 NOTE — Progress Notes (Signed)
Hematology/Oncology Consult note [facility]  Telephone:([phone] [phone]  Patient Care Team: Leonel Ramsay, MD as PCP - General (Infectious Diseases) Telford Nab, RN as Oncology Nurse Navigator Sindy Guadeloupe, MD as Consulting Physician (Hematology and Oncology)   Name of the patient: Doris Johnson  [id number]  [birth year]   Date of visit: 05/18/22  Diagnosis- extensive stage small cell lung cancer with liver and brain metastases    Chief complaint/ Reason for visit-on treatment assessment prior to cycle 20 of maintenance Tecentriq  Heme/Onc history: Patient is a 71 year old female with a remote history of smoking in her teenage years and exposure to passive smoking.  She has been having ongoing midsternal pain which has been growing since the last 6 to 7 months.  She had been to urgent care as well in the past.  She finally had a CT chest with contrast done in October 2021 which showed a large mass in the left side of the mediastinum measuring 6.7 x 6.2 x 6.1 cm causing severe compression of the left main pulmonary artery and around the aortic arch in the left subclavian artery.  Enlarged thyroid gland.  Left upper lobe nodule 1 x 0.7 cm.  She then underwent bronchoscopy with Dr.Aleskerov left upper lobe biopsy was nondiagnostic.  However station 4, 7 and 10 L lymph nodes were consistent with metastatic small cell carcinoma.   PET CT scan showed enlarged level 3 cervical lymph node 2.2 cm that was hypermetabolic with an SUV of 9.7.  Large central 7.3 cm AP window mass.  5.7 cm left liver mass.  MRI brain with and without contrast showed 2 small foci of enhancement in the right cerebellar hemisphere and right temporal lobe without mass-effect or edema as well concerning for brain metastases   Patient had 2 cycles of carbo etoposide chemotherapy along with Tecentriq and subsequent scan showed no significant improvement in the primary lung mass  or liver mass.  She received radiation treatment to her primary lung mass and also seen by [facility] for second opinion.  Her pathology was reviewed at [facility] and reported as possible neuroendocrine neoplasm But stated that small cell carcinoma cannot be excluded but not definitely diagnostic for it.  However [facility] pathology feels that this is consistent with small cell lung cancer.  She has completed 4 cycles of carbo etoposide Tecentriq chemotherapy and is currently on maintenance Tecentriq.  Repeat liver biopsy was also performed and was consistent with small cell lung cancer   Patient has had stable disease on maintenance Tecentriq so far and she has been on single agent Tecentriq since April 2022    new brain mets in march 2023. She completed WBRT    Interval history-patient is doing well and denies any complaints at this time.  Chest wall pain is minimal.  Bilateral lower extremity edema has resolved.  ECOG PS- 1 Pain scale- 0   Review of systems- Review of Systems  Constitutional:  Negative for chills, fever, malaise/fatigue and weight loss.  HENT:  Negative for congestion, ear discharge and nosebleeds.   Eyes:  Negative for blurred vision.  Respiratory:  Negative for cough, hemoptysis, sputum production, shortness of breath and wheezing.   Cardiovascular:  Negative for chest pain, palpitations, orthopnea and claudication.  Gastrointestinal:  Negative for abdominal pain, blood in stool, constipation, diarrhea, heartburn, melena, nausea and vomiting.  Genitourinary:  Negative for dysuria, flank pain, frequency, hematuria and urgency.  Musculoskeletal:  Negative for back pain,  joint pain and myalgias.  Skin:  Negative for rash.  Neurological:  Negative for dizziness, tingling, focal weakness, seizures, weakness and headaches.  Endo/Heme/Allergies:  Does not bruise/bleed easily.  Psychiatric/Behavioral:  Negative for depression and suicidal ideas. The patient does not have insomnia.       No  Known Allergies   Past Medical History:  Diagnosis Date   Cancer ([place])    Cataract    Diabetes mellitus without complication ([place])    Hyperlipidemia    Hypertension    Hypothyroidism    doctor's keeping an eye on thyroid    Lung cancer [facility])      Past Surgical History:  Procedure Laterality Date   ABDOMINAL HYSTERECTOMY     IR CV LINE INJECTION  09/28/2021   PORTA CATH INSERTION N/A 11/30/2020   Procedure: PORTA CATH INSERTION;  Surgeon: Algernon Huxley, MD;  Location: [place] [facility];  Service: Cardiovascular;  Laterality: N/A;   VIDEO BRONCHOSCOPY WITH ENDOBRONCHIAL NAVIGATION N/A 10/21/2020   Procedure: VIDEO BRONCHOSCOPY WITH ENDOBRONCHIAL NAVIGATION;  Surgeon: Ottie Glazier, MD;  Location: [facility] ORS;  Service: Thoracic;  Laterality: N/A;   VIDEO BRONCHOSCOPY WITH ENDOBRONCHIAL ULTRASOUND N/A 10/21/2020   Procedure: VIDEO BRONCHOSCOPY WITH ENDOBRONCHIAL ULTRASOUND;  Surgeon: Ottie Glazier, MD;  Location: [facility] ORS;  Service: Thoracic;  Laterality: N/A;    Social History   Socioeconomic History   Marital status: Single    Spouse name: Not on file   Number of children: Not on file   Years of education: Not on file   Highest education level: Not on file  Occupational History   Not on file  Tobacco Use   Smoking status: Never   Smokeless tobacco: Never  Vaping Use   Vaping Use: Never used  Substance and Sexual Activity   Alcohol use: No   Drug use: No   Sexual activity: Not Currently  Other Topics Concern   Not on file  Social History Narrative   Not on file   Social Determinants of Health   Financial Resource Strain: Not on file  Food Insecurity: Food Insecurity Present (07/08/2021)   Hunger Vital Sign    Worried About Running Out of Food in the Last Year: Sometimes true    Ran Out of Food in the Last Year: Sometimes true  Transportation Needs: No Transportation Needs (04/05/2022)   PRAPARE - Hydrologist (Medical): No    Lack  of Transportation (Non-Medical): No  Physical Activity: Not on file  Stress: Not on file  Social Connections: Not on file  Intimate Partner Violence: Not on file    Family History  Problem Relation Age of Onset   Cancer Mother        breast   Hypertension Mother    Breast cancer Mother 71   Heart disease Father    Hyperlipidemia Brother    Heart disease Brother      Current Outpatient Medications:    apixaban (ELIQUIS) 5 MG TABS tablet, Take 1 tablet (5 mg total) by mouth 2 (two) times daily., Disp: 60 tablet, Rfl: 1   dexamethasone (DECADRON) 4 MG tablet, Take 1 tablet (4 mg total) by mouth 2 (two) times daily with a meal., Disp: 60 tablet, Rfl: 1   fluticasone (FLONASE) 50 MCG/ACT nasal spray, Place 2 sprays into both nostrils daily., Disp: 16 g, Rfl: 5   lansoprazole (PREVACID) 30 MG capsule, Take 1 capsule (30 mg total) by mouth daily., Disp: 30 capsule, Rfl:  1   lidocaine-prilocaine (EMLA) cream, Apply 1 application. topically as needed. Apply small amount to port site at least 1 hour prior to it being accessed, cover with plastic wrap, Disp: 30 g, Rfl: 1   losartan (COZAAR) 25 MG tablet, Take 25 mg by mouth daily., Disp: , Rfl:    metFORMIN (GLUCOPHAGE-XR) 500 MG 24 hr tablet, Take by mouth., Disp: , Rfl:    oxycodone (ROXICODONE) 30 MG immediate release tablet, Take 1 tablet (30 mg total) by mouth every 4 (four) hours as needed for pain., Disp: 180 tablet, Rfl: 0   potassium chloride SA (KLOR-CON M) 20 MEQ tablet, Take 2 tablets (40 mEq total) by mouth daily., Disp: 7 tablet, Rfl: 0   furosemide (LASIX) 20 MG tablet, Take 1 tablet (20 mg total) by mouth daily. (Patient not taking: Reported on 05/18/2022), Disp: 60 tablet, Rfl: 0   LORazepam (ATIVAN) 0.5 MG tablet, Take 1 tablet (0.5 mg total) by mouth every 6 (six) hours as needed (Nausea or vomiting). (Patient not taking: Reported on 09/28/2021), Disp: 30 tablet, Rfl: 0   mirtazapine (REMERON) 15 MG tablet, Take 15 mg by mouth at  bedtime. (Patient not taking: Reported on 12/21/2021), Disp: , Rfl:    naloxone (NARCAN) nasal spray 4 mg/0.1 mL, , Disp: , Rfl:    ondansetron (ZOFRAN) 8 MG tablet, Take 1 tablet (8 mg total) by mouth 2 (two) times daily as needed for refractory nausea / vomiting. Start on day 3 after carboplatin chemo. (Patient not taking: Reported on 10/19/2021), Disp: 30 tablet, Rfl: 1   polyethylene glycol (MIRALAX / GLYCOLAX) 17 g packet, Take 17 g by mouth daily. (Patient not taking: Reported on 02/22/2022), Disp: , Rfl:    prochlorperazine (COMPAZINE) 10 MG tablet, Take 1 tablet (10 mg total) by mouth every 6 (six) hours as needed (Nausea or vomiting). (Patient not taking: Reported on 10/19/2021), Disp: 30 tablet, Rfl: 1   traZODone (DESYREL) 50 MG tablet, Take by mouth. (Patient not taking: Reported on 04/26/2022), Disp: , Rfl:  No current facility-administered medications for this visit.  Facility-Administered Medications Ordered in Other Visits:    sodium chloride flush (NS) 0.9 % injection 10 mL, 10 mL, Intravenous, PRN, Sindy Guadeloupe, MD, 10 mL at 12/15/20 0850  Physical exam:  Vitals:   05/18/22 1034  Resp: 16  Temp: (!) 97.3 F (36.3 C)  Weight: 163 lb (73.9 kg)   Physical Exam Constitutional:      General: She is not in acute distress. Cardiovascular:     Rate and Rhythm: Normal rate and regular rhythm.     Heart sounds: Normal heart sounds.  Pulmonary:     Effort: Pulmonary effort is normal.     Breath sounds: Normal breath sounds.  Abdominal:     General: Bowel sounds are normal.     Palpations: Abdomen is soft.  Skin:    General: Skin is warm and dry.  Neurological:     Mental Status: She is alert and oriented to person, place, and time.         Latest Ref Rng & Units 04/26/2022    8:26 AM  CMP  Glucose 70 - 99 mg/dL 105   BUN 8 - 23 mg/dL 12   Creatinine 0.44 - 1.00 mg/dL 0.54   Sodium 135 - 145 mmol/L 142   Potassium 3.5 - 5.1 mmol/L 3.7   Chloride 98 - 111 mmol/L 113    CO2 22 - 32 mmol/L 24   Calcium  8.9 - 10.3 mg/dL 9.3   Total Protein 6.5 - 8.1 g/dL 6.5   Total Bilirubin 0.3 - 1.2 mg/dL 0.8   Alkaline Phos 38 - 126 U/L 64   AST 15 - 41 U/L 17   ALT 0 - 44 U/L 13       Latest Ref Rng & Units 04/26/2022    8:26 AM  CBC  WBC 4.0 - 10.5 K/uL 3.9   Hemoglobin 12.0 - 15.0 g/dL 11.5   Hematocrit 36.0 - 46.0 % 35.2   Platelets 150 - 400 K/uL 221     No images are attached to the encounter.  CT CHEST ABDOMEN PELVIS W CONTRAST  Result Date: 04/21/2022 CLINICAL DATA:  Metastatic small cell lung cancer restaging. * Tracking Code: BO * EXAM: CT CHEST, ABDOMEN, AND PELVIS WITH CONTRAST TECHNIQUE: Multidetector CT imaging of the chest, abdomen and pelvis was performed following the standard protocol during bolus administration of intravenous contrast. RADIATION DOSE REDUCTION: This exam was performed according to the departmental dose-optimization program which includes automated exposure control, adjustment of the mA and/or kV according to patient size and/or use of iterative reconstruction technique. CONTRAST:  158mL OMNIPAQUE IOHEXOL 300 MG/ML  SOLN COMPARISON:  Multiple 12/29/2021 FINDINGS: CT CHEST FINDINGS Cardiovascular: Aortic and branch vessel atherosclerotic vascular disease. Upper normal heart size. Mediastinum/Nodes: Stable low-density centered in the AP window and tracking partially around the aortic arch and left pulmonary artery, this measures up to 3.1 cm in thickness on image 17 series 2, formerly the same when measured in the same fashion. Overall the extent and appearance is not changed and this region presumably represents treated tumor. Looking back on exams such as 09/03/2020 there is previously a large mass in this vicinity measuring up to 6.7 cm in thickness. Chronically stable multinodular thyroid prominence. Left supraclavicular node 1.4 cm in short axis on image 3 series 2, formerly 0.6 cm. We also image the bottom of a left level IV lymph node  which is probably enlarged although only partially included. Lungs/Pleura: Stable scarring and volume loss in the left upper lobe along with paramediastinal fibrosis not significantly changed. Small bilateral subpleural nodules measuring 5 mm or less in diameter not substantially changed. There is also a 3 mm right upper lobe nodule which is stable on image 33 series 4. Musculoskeletal: Thoracic spondylosis. CT ABDOMEN PELVIS FINDINGS Hepatobiliary: The hypodense lesion in segment 4 of the liver is adjacent to the gallbladder and roughly similar in density to the gallbladder, making difficulty mildly problematic, this lesion appears to measure about 3.2 by 1.3 cm on image 37 series 5, previously 3.3 by 1.4 cm when measured in the same fashion. There is some mild steatosis adjacent to the falciform ligament. Stable 1.5 cm diffusely enhancing lesion in the right hepatic lobe on image 44 of series 2 with associated delayed enhancement, not previously hypermetabolic, most likely a hemangioma. Gallbladder unremarkable. Pancreas: Unremarkable Spleen: Unremarkable Adrenals/Urinary Tract: The adrenal glands and kidneys appear unremarkable. Urinary bladder unremarkable. Contrast in the urinary bladder likely represents excreted gadolinium from the earlier brain MRI. Stomach/Bowel: Unremarkable Vascular/Lymphatic: On images 69-87 of series 5, there is a filling defect in a left renal vein tributary extending from the left kidney lower pole compatible with left renal vein thrombosis affecting this tributary. This was not present on the 12/29/2021 exam. I do not see a discrete left renal mass. Atherosclerosis is present, including aortoiliac atherosclerotic disease. No pathologic adenopathy in the abdomen/pelvis. Reproductive: Uterus absent.  Adnexa unremarkable.  Other: No supplemental non-categorized findings. Musculoskeletal: 6 mm degenerative anterolisthesis at L4-5. Lumbar spondylosis and degenerative disc disease result in  substantial bilateral foraminal impingement at L4-5 and L5-S1 and there is mild central narrowing of the thecal sac at L4-5. IMPRESSION: 1. Left renal vein thrombus, in a left kidney lower pole renal vein tributary, as shown on images 108 through 112 of series 6. No tumor or underlying lesion in the left kidney is observed. 2. Stable appearance of abnormal low density in the AP window corresponding to the site of previously treated tumor, no change from the prior exam. 3. Enlargement of a left supraclavicular lymph node, currently 1.4 cm in short axis. I suspect that the adjacent left lower neck level IV lymph node is probably enlarged as well, but it is only partially included on today's exam. 4. Several small subpleural nodules measuring 5 mm or less in size are unchanged. 5. Stable treated lesion in the liver just above the gallbladder fossa. 6. Other imaging findings of potential clinical significance: Aortic Atherosclerosis (ICD10-I70.0). Lumbar impingement at L4-5 and L5-S1. Suspected hepatic hemangioma. Results (particularly impression # 1) called via telephone by Dr. Janeece Fitting at the time of interpretation on 04/21/2022 at 1:26 pm to provider Dr. Andi Devon, who verbally acknowledged these results. Electronically Signed   By: Van Clines M.D.   On: 04/21/2022 13:30   MR Brain W Wo Contrast  Result Date: 04/21/2022 CLINICAL DATA:  Small-cell lung cancer. Headaches. Whole brain radiation therapy in 01/2022. EXAM: MRI HEAD WITHOUT AND WITH CONTRAST TECHNIQUE: Multiplanar, multiecho pulse sequences of the brain and surrounding structures were obtained without and with intravenous contrast. CONTRAST:  16mL GADAVIST GADOBUTROL 1 MMOL/ML IV SOLN COMPARISON:  Head MRI 01/25/2022 FINDINGS: Brain: Numerous small lesions throughout the cerebrum and cerebellum are all stable to smaller than on the prior study. The majority of these are intrinsically T1 hyperintense and demonstrate varying degrees of enhancement  and chronic blood products. The largest lesions currently measure 4 mm in the right cerebellar hemisphere (series 1024, image 59) and right postcentral gyrus (series 1024, image 126). There is no associated edema, and no definite new lesions are identified. No acute infarct, midline shift, or extra-axial fluid collection is evident. The ventricles are normal in size. Vascular: Major intracranial vascular flow voids are preserved. Skull and upper cervical spine: No suspicious marrow lesion. Sinuses/Orbits: Bilateral cataract extraction. Clear paranasal sinuses. Small bilateral mastoid effusions. Other: None. IMPRESSION: Stable to decreased size of all brain metastases. No evidence of new intracranial metastases. Electronically Signed   By: Logan Bores M.D.   On: 04/21/2022 11:47     Assessment and plan- Patient is a 71 y.o. female with extensive stage small cell lung cancer with liver metastases.  She is here for on treatment assessment prior to cycle 20 of palliative Tecentriq  Counts okay to proceed with cycle 20 of palliative Tecentriq today and I will see her back in 3 weeks for cycle 21.  Plan is to continue treatment until progression or toxicity.  I will plan to get repeat CT chest abdomen and pelvis with contrast and MRI brain with and without contrast in early to mid September 2023.  So far she has tolerated treatments well without any significant toxicity and good response to treatment  Brain mets: S/p whole brain radiation with response to treatment.  Continue to monitor   Visit Diagnosis 1. Encounter for antineoplastic immunotherapy   2. Small cell carcinoma of lung metastatic to liver ([place])  Dr. Randa Evens, MD, MPH [facility] at [facility] [phone] 05/18/2022 10:37 AM

## 2022-05-18 NOTE — Patient Instructions (Signed)
[facility] CANCER CTR AT [place]  Discharge Instructions: Thank you for choosing [facility] to provide your oncology and hematology care.  If you have a lab appointment with the [facility], please go directly to the [facility] and check in at the registration area.  Wear comfortable clothing and clothing appropriate for easy access to any Portacath or PICC line.   We strive to give you quality time with your provider. You may need to reschedule your appointment if you arrive late (15 or more minutes).  Arriving late affects you and other patients whose appointments are after yours.  Also, if you miss three or more appointments without notifying the office, you may be dismissed from the clinic at the provider's discretion.      For prescription refill requests, have your pharmacy contact our office and allow 72 hours for refills to be completed.    Today you received the following chemotherapy and/or immunotherapy agents Tecentriq      To help prevent nausea and vomiting after your treatment, we encourage you to take your nausea medication as directed.  BELOW ARE SYMPTOMS THAT SHOULD BE REPORTED IMMEDIATELY: *FEVER GREATER THAN 100.4 F (38 C) OR HIGHER *CHILLS OR SWEATING *NAUSEA AND VOMITING THAT IS NOT CONTROLLED WITH YOUR NAUSEA MEDICATION *UNUSUAL SHORTNESS OF BREATH *UNUSUAL BRUISING OR BLEEDING *URINARY PROBLEMS (pain or burning when urinating, or frequent urination) *BOWEL PROBLEMS (unusual diarrhea, constipation, pain near the anus) TENDERNESS IN MOUTH AND THROAT WITH OR WITHOUT PRESENCE OF ULCERS (sore throat, sores in mouth, or a toothache) UNUSUAL RASH, SWELLING OR PAIN  UNUSUAL VAGINAL DISCHARGE OR ITCHING   Items with * indicate a potential emergency and should be followed up as soon as possible or go to the Emergency Department if any problems should occur.  Please show the CHEMOTHERAPY ALERT CARD or IMMUNOTHERAPY ALERT CARD at check-in to  the Emergency Department and triage nurse.  Should you have questions after your visit or need to cancel or reschedule your appointment, please contact [facility] CANCER [place] AT [place]  [phone] and follow the prompts.  Office hours are 8:00 a.m. to 4:30 p.m. Monday - Friday. Please note that voicemails left after 4:00 p.m. may not be returned until the following business day.  We are closed weekends and major holidays. You have access to a nurse at all times for urgent questions. Please call the main number to the clinic [phone] and follow the prompts.  For any non-urgent questions, you may also contact your provider using MyChart. We now offer e-Visits for anyone 70 and older to request care online for non-urgent symptoms. For details visit [email or web address].   Also download the MyChart app! Go to the app store, search "MyChart", open the app, select [place], and log in with your MyChart username and password.  Masks are optional in the cancer centers. If you would like for your care team to wear a mask while they are taking care of you, please let them know. For doctor visits, patients may have with them one support person who is at least 72 years old. At this time, visitors are not allowed in the infusion area.

## 2022-05-18 NOTE — Progress Notes (Signed)
Pt will like to know if she has to continue being on LASIX. Pt is currently only taking ELIQUIS, PREVACID, AND OXYCODONE WHEN NEEDED. Also, pt is having more frequent memory issues. Not able to remember if she took her medication this morning or not.

## 2022-05-20 ENCOUNTER — Telehealth: Payer: Self-pay

## 2022-05-20 NOTE — Telephone Encounter (Signed)
Faxed over pts daughter FMLA paperwork to [facility] for [employer] at [phone] and have recieved fax confirmation. Paperwork sent to HIM to scan into chart.

## 2022-05-24 ENCOUNTER — Telehealth: Payer: Self-pay | Admitting: *Deleted

## 2022-05-24 NOTE — Telephone Encounter (Signed)
Daughter called stating that a section on her FMLA papers is incorrect and needs to be fixed and resent. Please call her so she can explain what needs to be corrected [phone]

## 2022-05-24 NOTE — Telephone Encounter (Signed)
Returned family members phone call for incorrect FMLA paperwork; no answer; not able to leave a VM as VM is full.

## 2022-05-25 ENCOUNTER — Encounter: Payer: Self-pay | Admitting: Oncology

## 2022-06-07 ENCOUNTER — Encounter: Payer: Self-pay | Admitting: Oncology

## 2022-06-07 ENCOUNTER — Inpatient Hospital Stay (HOSPITAL_BASED_OUTPATIENT_CLINIC_OR_DEPARTMENT_OTHER): Payer: Medicare HMO | Admitting: Oncology

## 2022-06-07 ENCOUNTER — Inpatient Hospital Stay: Payer: Medicare HMO

## 2022-06-07 VITALS — BP 135/83 | HR 70 | Temp 96.8°F | Resp 16 | Wt 158.2 lb

## 2022-06-07 DIAGNOSIS — C3490 Malignant neoplasm of unspecified part of unspecified bronchus or lung: Secondary | ICD-10-CM

## 2022-06-07 DIAGNOSIS — Z7901 Long term (current) use of anticoagulants: Secondary | ICD-10-CM

## 2022-06-07 DIAGNOSIS — Z5112 Encounter for antineoplastic immunotherapy: Secondary | ICD-10-CM

## 2022-06-07 DIAGNOSIS — R590 Localized enlarged lymph nodes: Secondary | ICD-10-CM | POA: Diagnosis not present

## 2022-06-07 DIAGNOSIS — C7931 Secondary malignant neoplasm of brain: Secondary | ICD-10-CM | POA: Diagnosis not present

## 2022-06-07 DIAGNOSIS — E049 Nontoxic goiter, unspecified: Secondary | ICD-10-CM | POA: Diagnosis not present

## 2022-06-07 DIAGNOSIS — Z5941 Food insecurity: Secondary | ICD-10-CM | POA: Diagnosis not present

## 2022-06-07 DIAGNOSIS — C787 Secondary malignant neoplasm of liver and intrahepatic bile duct: Secondary | ICD-10-CM | POA: Diagnosis not present

## 2022-06-07 DIAGNOSIS — R0789 Other chest pain: Secondary | ICD-10-CM | POA: Diagnosis not present

## 2022-06-07 DIAGNOSIS — Z7722 Contact with and (suspected) exposure to environmental tobacco smoke (acute) (chronic): Secondary | ICD-10-CM | POA: Diagnosis not present

## 2022-06-07 DIAGNOSIS — C3412 Malignant neoplasm of upper lobe, left bronchus or lung: Secondary | ICD-10-CM | POA: Diagnosis not present

## 2022-06-07 LAB — COMPREHENSIVE METABOLIC PANEL
ALT: 13 U/L (ref 0–44)
AST: 20 U/L (ref 15–41)
Albumin: 4.2 g/dL (ref 3.5–5.0)
Alkaline Phosphatase: 49 U/L (ref 38–126)
Anion gap: 7 (ref 5–15)
BUN: 14 mg/dL (ref 8–23)
CO2: 23 mmol/L (ref 22–32)
Calcium: 10 mg/dL (ref 8.9–10.3)
Chloride: 109 mmol/L (ref 98–111)
Creatinine, Ser: 0.63 mg/dL (ref 0.44–1.00)
GFR, Estimated: >60 mL/min (ref >60)
Glucose, Bld: 110 mg/dL — ABNORMAL HIGH (ref 70–99)
Potassium: 3.8 mmol/L (ref 3.5–5.1)
Sodium: 139 mmol/L (ref 135–145)
Total Bilirubin: 0.8 mg/dL (ref 0.3–1.2)
Total Protein: 7.4 g/dL (ref 6.5–8.1)

## 2022-06-07 LAB — CBC WITH DIFFERENTIAL/PLATELET
Abs Immature Granulocytes: 0.01 10*3/uL (ref 0.00–0.07)
Basophils Absolute: 0 10*3/uL (ref 0.0–0.1)
Basophils Relative: 1 %
Eosinophils Absolute: 0.1 10*3/uL (ref 0.0–0.5)
Eosinophils Relative: 4 %
HCT: 38.5 % (ref 36.0–46.0)
Hemoglobin: 12.6 g/dL (ref 12.0–15.0)
Immature Granulocytes: 0 %
Lymphocytes Relative: 25 %
Lymphs Abs: 0.9 10*3/uL (ref 0.7–4.0)
MCH: 30.5 pg (ref 26.0–34.0)
MCHC: 32.7 g/dL (ref 30.0–36.0)
MCV: 93.2 fL (ref 80.0–100.0)
Monocytes Absolute: 0.4 10*3/uL (ref 0.1–1.0)
Monocytes Relative: 12 %
Neutro Abs: 2 10*3/uL (ref 1.7–7.7)
Neutrophils Relative %: 58 %
Platelets: 197 10*3/uL (ref 150–400)
RBC: 4.13 MIL/uL (ref 3.87–5.11)
RDW: 12.4 % (ref 11.5–15.5)
WBC: 3.5 10*3/uL — ABNORMAL LOW (ref 4.0–10.5)
nRBC: 0 % (ref 0.0–0.2)

## 2022-06-07 MED ORDER — SODIUM CHLORIDE 0.9 % IV SOLN
Freq: Once | INTRAVENOUS | Status: AC
  Filled 2022-06-07: qty 250

## 2022-06-07 MED ORDER — SODIUM CHLORIDE 0.9 % IV SOLN
1200.0000 mg | Freq: Once | INTRAVENOUS | Status: AC
  Administered 2022-06-07: 1200 mg via INTRAVENOUS
  Filled 2022-06-07: qty 20

## 2022-06-07 MED ORDER — HEPARIN SOD (PORK) LOCK FLUSH 100 UNIT/ML IV SOLN
500.0000 [IU] | Freq: Once | INTRAVENOUS | Status: AC | PRN
  Administered 2022-06-07: 500 [IU]
  Filled 2022-06-07: qty 5

## 2022-06-07 MED ORDER — SODIUM CHLORIDE 0.9% FLUSH
10.0000 mL | Freq: Once | INTRAVENOUS | Status: AC
  Administered 2022-06-07: 10 mL via INTRAVENOUS
  Filled 2022-06-07: qty 10

## 2022-06-07 MED ORDER — HEPARIN SOD (PORK) LOCK FLUSH 100 UNIT/ML IV SOLN
INTRAVENOUS | Status: AC
  Filled 2022-06-07: qty 5

## 2022-06-07 NOTE — Progress Notes (Signed)
Over the weekend pt states that she vomited and not sure if it was due to mucus and congestion because vomit was clear in color. As well as, dealing with headaches here and there.

## 2022-06-07 NOTE — Progress Notes (Signed)
Hematology/Oncology Consult note [facility]  Telephone:([phone] [phone]  Patient Care Team: Leonel Ramsay, MD as PCP - General (Infectious Diseases) Telford Nab, RN as Oncology Nurse Navigator Sindy Guadeloupe, MD as Consulting Physician (Hematology and Oncology)   Name of the patient: Doris Johnson  [id number]  [birth year]   Date of visit: 06/07/22  Diagnosis- extensive stage small cell lung cancer with liver and brain metastases    Chief complaint/ Reason for visit-on treatment assessment prior to cycle 21 of maintenance Tecentriq  Heme/Onc history: Patient is a 71 year old female with a remote history of smoking in her teenage years and exposure to passive smoking.  She has been having ongoing midsternal pain which has been growing since the last 6 to 7 months.  She had been to urgent care as well in the past.  She finally had a CT chest with contrast done in October 2021 which showed a large mass in the left side of the mediastinum measuring 6.7 x 6.2 x 6.1 cm causing severe compression of the left main pulmonary artery and around the aortic arch in the left subclavian artery.  Enlarged thyroid gland.  Left upper lobe nodule 1 x 0.7 cm.  She then underwent bronchoscopy with Dr.Aleskerov left upper lobe biopsy was nondiagnostic.  However station 4, 7 and 10 L lymph nodes were consistent with metastatic small cell carcinoma.   PET CT scan showed enlarged level 3 cervical lymph node 2.2 cm that was hypermetabolic with an SUV of 9.7.  Large central 7.3 cm AP window mass.  5.7 cm left liver mass.  MRI brain with and without contrast showed 2 small foci of enhancement in the right cerebellar hemisphere and right temporal lobe without mass-effect or edema as well concerning for brain metastases   Patient had 2 cycles of carbo etoposide chemotherapy along with Tecentriq and subsequent scan showed no significant improvement in the primary lung mass  or liver mass.  She received radiation treatment to her primary lung mass and also seen by [facility] for second opinion.  Her pathology was reviewed at [facility] and reported as possible neuroendocrine neoplasm But stated that small cell carcinoma cannot be excluded but not definitely diagnostic for it.  However [facility] pathology feels that this is consistent with small cell lung cancer.  She has completed 4 cycles of carbo etoposide Tecentriq chemotherapy and is currently on maintenance Tecentriq.  Repeat liver biopsy was also performed and was consistent with small cell lung cancer   Patient has had stable disease on maintenance Tecentriq so far and she has been on single agent Tecentriq since April 2022    new brain mets in march 2023. 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  Denies any nausea vomiting or chest wall pain.  ECOG PS- 1 Pain scale- 0   Review of systems- Review of Systems  Constitutional:  Negative for chills, fever, malaise/fatigue and weight loss.  HENT:  Negative for congestion, ear discharge and nosebleeds.   Eyes:  Negative for blurred vision.  Respiratory:  Negative for cough, hemoptysis, sputum production, shortness of breath and wheezing.   Cardiovascular:  Negative for chest pain, palpitations, orthopnea and claudication.  Gastrointestinal:  Negative for abdominal pain, blood in stool, constipation, diarrhea, heartburn, melena, nausea and vomiting.  Genitourinary:  Negative for dysuria, flank pain, frequency, hematuria and urgency.  Musculoskeletal:  Negative for back pain, joint pain and myalgias.  Skin:  Negative for rash.  Neurological:  Negative for dizziness, tingling, focal weakness, seizures, weakness and headaches.  Endo/Heme/Allergies:  Does not bruise/bleed easily.  Psychiatric/Behavioral:  Negative for depression and suicidal  ideas. The patient does not have insomnia.       No Known Allergies   Past Medical History:  Diagnosis Date   Cancer ([place])    Cataract    Diabetes mellitus without complication ([place])    Hyperlipidemia    Hypertension    Hypothyroidism    doctor's keeping an eye on thyroid    Lung cancer [facility])      Past Surgical History:  Procedure Laterality Date   ABDOMINAL HYSTERECTOMY     IR CV LINE INJECTION  09/28/2021   PORTA CATH INSERTION N/A 11/30/2020   Procedure: PORTA CATH INSERTION;  Surgeon: Algernon Huxley, MD;  Location: [place] CV [facility];  Service: Cardiovascular;  Laterality: N/A;   VIDEO BRONCHOSCOPY WITH ENDOBRONCHIAL NAVIGATION N/A 10/21/2020   Procedure: VIDEO BRONCHOSCOPY WITH ENDOBRONCHIAL NAVIGATION;  Surgeon: Ottie Glazier, MD;  Location: [facility] ORS;  Service: Thoracic;  Laterality: N/A;   VIDEO BRONCHOSCOPY WITH ENDOBRONCHIAL ULTRASOUND N/A 10/21/2020   Procedure: VIDEO BRONCHOSCOPY WITH ENDOBRONCHIAL ULTRASOUND;  Surgeon: Ottie Glazier, MD;  Location: [facility] ORS;  Service: Thoracic;  Laterality: N/A;    Social History   Socioeconomic History   Marital status: Single    Spouse name: Not on file   Number of children: Not on file   Years of education: Not on file   Highest education level: Not on file  Occupational History   Not on file  Tobacco Use   Smoking status: Never   Smokeless tobacco: Never  Vaping Use   Vaping Use: Never used  Substance and Sexual Activity   Alcohol use: No   Drug use: No   Sexual activity: Not Currently  Other Topics Concern   Not on file  Social History Narrative   Not on file   Social Determinants of Health   Financial Resource Strain: Not on file  Food Insecurity: Food Insecurity Present (07/08/2021)   Hunger Vital Sign    Worried About Running Out of Food in the Last Year: Sometimes true    Ran Out of Food in the Last Year: Sometimes true  Transportation Needs: No Transportation Needs (04/05/2022)   PRAPARE -  Hydrologist (Medical): No    Lack of Transportation (Non-Medical): No  Physical Activity: Not on file  Stress: Not on file  Social Connections: Not on file  Intimate Partner Violence: Not on file    Family History  Problem Relation Age of Onset   Cancer Mother        breast   Hypertension Mother    Breast cancer Mother 3   Heart disease Father    Hyperlipidemia Brother    Heart disease Brother      Current Outpatient Medications:    apixaban (ELIQUIS) 5 MG TABS tablet, Take 1 tablet (5 mg total) by mouth 2 (two) times daily., Disp: 60 tablet, Rfl: 1   dexamethasone (DECADRON) 4 MG tablet, Take 1 tablet (4 mg total) by mouth 2 (two) times daily with a meal., Disp: 60 tablet, Rfl: 1   furosemide (LASIX) 20 MG tablet, Take 1 tablet (20 mg total) by mouth daily., Disp: 60 tablet, Rfl: 0   lansoprazole (PREVACID) 30 MG capsule, Take 1 capsule (30 mg total)  by mouth daily., Disp: 30 capsule, Rfl: 1   losartan (COZAAR) 25 MG tablet, Take 25 mg by mouth daily., Disp: , Rfl:    metFORMIN (GLUCOPHAGE-XR) 500 MG 24 hr tablet, Take by mouth., Disp: , Rfl:    polyethylene glycol (MIRALAX / GLYCOLAX) 17 g packet, Take 17 g by mouth daily., Disp: , Rfl:    potassium chloride SA (KLOR-CON M) 20 MEQ tablet, Take 2 tablets (40 mEq total) by mouth daily., Disp: 7 tablet, Rfl: 0   fluticasone (FLONASE) 50 MCG/ACT nasal spray, Place 2 sprays into both nostrils daily. (Patient not taking: Reported on 06/07/2022), Disp: 16 g, Rfl: 5   lidocaine-prilocaine (EMLA) cream, Apply 1 application. topically as needed. Apply small amount to port site at least 1 hour prior to it being accessed, cover with plastic wrap, Disp: 30 g, Rfl: 1   LORazepam (ATIVAN) 0.5 MG tablet, Take 1 tablet (0.5 mg total) by mouth every 6 (six) hours as needed (Nausea or vomiting). (Patient not taking: Reported on 09/28/2021), Disp: 30 tablet, Rfl: 0   mirtazapine (REMERON) 15 MG tablet, Take 15 mg by mouth  at bedtime. (Patient not taking: Reported on 12/21/2021), Disp: , Rfl:    naloxone (NARCAN) nasal spray 4 mg/0.1 mL, , Disp: , Rfl:    ondansetron (ZOFRAN) 8 MG tablet, Take 1 tablet (8 mg total) by mouth 2 (two) times daily as needed for refractory nausea / vomiting. Start on day 3 after carboplatin chemo. (Patient not taking: Reported on 10/19/2021), Disp: 30 tablet, Rfl: 1   oxycodone (ROXICODONE) 30 MG immediate release tablet, Take 1 tablet (30 mg total) by mouth every 4 (four) hours as needed for pain. (Patient not taking: Reported on 06/07/2022), Disp: 180 tablet, Rfl: 0   prochlorperazine (COMPAZINE) 10 MG tablet, Take 1 tablet (10 mg total) by mouth every 6 (six) hours as needed (Nausea or vomiting). (Patient not taking: Reported on 10/19/2021), Disp: 30 tablet, Rfl: 1   traZODone (DESYREL) 50 MG tablet, Take by mouth. (Patient not taking: Reported on 04/26/2022), Disp: , Rfl:  No current facility-administered medications for this visit.  Facility-Administered Medications Ordered in Other Visits:    atezolizumab (TECENTRIQ) 1,200 mg in sodium chloride 0.9 % 250 mL chemo infusion, 1,200 mg, Intravenous, Once, Sindy Guadeloupe, MD   heparin lock flush 100 unit/mL, 500 Units, Intracatheter, Once PRN, Sindy Guadeloupe, MD   sodium chloride flush (NS) 0.9 % injection 10 mL, 10 mL, Intravenous, PRN, Sindy Guadeloupe, MD, 10 mL at 12/15/20 0850  Physical exam:  Vitals:   06/07/22 1004  BP: 135/83  Pulse: 70  Resp: 16  Temp: (!) 96.8 F (36 C)  SpO2: 99%  Weight: 158 lb 3.2 oz (71.8 kg)   Physical Exam Cardiovascular:     Rate and Rhythm: Normal rate and regular rhythm.     Heart sounds: Normal heart sounds.  Pulmonary:     Effort: Pulmonary effort is normal.     Breath sounds: Normal breath sounds.  Abdominal:     General: Bowel sounds are normal.     Palpations: Abdomen is soft.  Skin:    General: Skin is warm and dry.  Neurological:     Mental Status: She is alert and oriented to person,  place, and time.         Latest Ref Rng & Units 06/07/2022    9:14 AM  CMP  Glucose 70 - 99 mg/dL 110   BUN 8 - 23 mg/dL 14  Creatinine 0.44 - 1.00 mg/dL 0.63   Sodium 135 - 145 mmol/L 139   Potassium 3.5 - 5.1 mmol/L 3.8   Chloride 98 - 111 mmol/L 109   CO2 22 - 32 mmol/L 23   Calcium 8.9 - 10.3 mg/dL 10.0   Total Protein 6.5 - 8.1 g/dL 7.4   Total Bilirubin 0.3 - 1.2 mg/dL 0.8   Alkaline Phos 38 - 126 U/L 49   AST 15 - 41 U/L 20   ALT 0 - 44 U/L 13       Latest Ref Rng & Units 06/07/2022    9:14 AM  CBC  WBC 4.0 - 10.5 K/uL 3.5   Hemoglobin 12.0 - 15.0 g/dL 12.6   Hematocrit 36.0 - 46.0 % 38.5   Platelets 150 - 400 K/uL 197     No images are attached to the encounter.  No results found.   Assessment and plan- Patient is a 71 y.o. female with extensive stage small cell lung cancer with liver and brain metastases here for on treatment assessment prior to cycle 21 of maintenance Tecentriq  Counts okay to proceed with cycle 21 of maintenance Tecentriq today.  She has mild leukopenia which is overall remained stable.  She will be seen by covering MD or NP in 3 weeks for cycle 22 and I will see her back in 6 weeks for cycle 23.  Plan is to continue present treatment until progression or toxicity.  I will order repeat scans when I see her in 6 weeks.  History of renal vein thrombosis: On Eliquis   Visit Diagnosis 1. Small cell carcinoma of lung metastatic to liver ([place])   2. Encounter for antineoplastic immunotherapy   3. Current use of long term anticoagulation      Dr. Randa Evens, MD, MPH [facility] [phone] 06/07/2022 11:19 AM

## 2022-06-07 NOTE — Patient Instructions (Signed)
[facility] AT [place]  Discharge Instructions: Thank you for choosing [facility] to provide your oncology and hematology care.  If you have a lab appointment with the [facility], please go directly to the [facility] and check in at the registration area.  Wear comfortable clothing and clothing appropriate for easy access to any Portacath or PICC line.   We strive to give you quality time with your provider. You may need to reschedule your appointment if you arrive late (15 or more minutes).  Arriving late affects you and other patients whose appointments are after yours.  Also, if you miss three or more appointments without notifying the office, you may be dismissed from the clinic at the provider's discretion.      For prescription refill requests, have your pharmacy contact our office and allow 72 hours for refills to be completed.    Today you received the following chemotherapy and/or immunotherapy agents Tecentriq   To help prevent nausea and vomiting after your treatment, we encourage you to take your nausea medication as directed.  BELOW ARE SYMPTOMS THAT SHOULD BE REPORTED IMMEDIATELY: *FEVER GREATER THAN 100.4 F (38 C) OR HIGHER *CHILLS OR SWEATING *NAUSEA AND VOMITING THAT IS NOT CONTROLLED WITH YOUR NAUSEA MEDICATION *UNUSUAL SHORTNESS OF BREATH *UNUSUAL BRUISING OR BLEEDING *URINARY PROBLEMS (pain or burning when urinating, or frequent urination) *BOWEL PROBLEMS (unusual diarrhea, constipation, pain near the anus) TENDERNESS IN MOUTH AND THROAT WITH OR WITHOUT PRESENCE OF ULCERS (sore throat, sores in mouth, or a toothache) UNUSUAL RASH, SWELLING OR PAIN  UNUSUAL VAGINAL DISCHARGE OR ITCHING   Items with * indicate a potential emergency and should be followed up as soon as possible or go to the Emergency Department if any problems should occur.  Please show the CHEMOTHERAPY ALERT CARD or IMMUNOTHERAPY ALERT CARD at check-in to the  Emergency Department and triage nurse.  Should you have questions after your visit or need to cancel or reschedule your appointment, please contact [facility] [place] AT [place]  [phone] and follow the prompts.  Office hours are 8:00 a.m. to 4:30 p.m. Monday - Friday. Please note that voicemails left after 4:00 p.m. may not be returned until the following business day.  We are closed weekends and major holidays. You have access to a nurse at all times for urgent questions. Please call the main number to the clinic [phone] and follow the prompts.  For any non-urgent questions, you may also contact your provider using MyChart. We now offer e-Visits for anyone 71 and older to request care online for non-urgent symptoms. For details visit [email or web address].   Also download the MyChart app! Go to the app store, search "MyChart", open the app, select [facility], and log in with your MyChart username and password.  Masks are optional in the cancer centers. If you would like for your care team to wear a mask while they are taking care of you, please let them know. For doctor visits, patients may have with them one support person who is at least 72 years old. At this time, visitors are not allowed in the infusion area.

## 2022-06-08 ENCOUNTER — Ambulatory Visit: Payer: Medicare HMO

## 2022-06-08 ENCOUNTER — Other Ambulatory Visit: Payer: Medicare HMO

## 2022-06-08 ENCOUNTER — Ambulatory Visit: Payer: Medicare HMO | Admitting: Oncology

## 2022-06-14 ENCOUNTER — Ambulatory Visit (INDEPENDENT_AMBULATORY_CARE_PROVIDER_SITE_OTHER): Payer: Medicare HMO | Admitting: Nurse Practitioner

## 2022-06-14 ENCOUNTER — Encounter (INDEPENDENT_AMBULATORY_CARE_PROVIDER_SITE_OTHER): Payer: Self-pay | Admitting: Nurse Practitioner

## 2022-06-14 VITALS — BP 164/83 | HR 86 | Resp 16 | Ht 66.0 in | Wt 161.4 lb

## 2022-06-14 DIAGNOSIS — I10 Essential (primary) hypertension: Secondary | ICD-10-CM

## 2022-06-14 DIAGNOSIS — I890 Lymphedema, not elsewhere classified: Secondary | ICD-10-CM | POA: Diagnosis not present

## 2022-06-14 DIAGNOSIS — E1169 Type 2 diabetes mellitus with other specified complication: Secondary | ICD-10-CM

## 2022-06-14 DIAGNOSIS — R0989 Other specified symptoms and signs involving the circulatory and respiratory systems: Secondary | ICD-10-CM

## 2022-06-14 NOTE — Progress Notes (Signed)
Subjective:    Patient ID: Doris Johnson, female    DOB: 03-Apr-1951, 71 y.o.   MRN: [id number] No chief complaint on file.   The patient returns to the office for followup evaluation regarding leg swelling.  The swelling has improved quite a bit and the pain associated with swelling has decreased substantially. There have not been any interval development of a ulcerations or wounds.  Since the previous visit the patient has been wearing graduated compression stockings and has noted some improvement in the lymphedema. The patient has been using compression routinely morning until night.  The patient also states elevation during the day and exercise (such as walking) is being done too.        Review of Systems  Cardiovascular:  Negative for leg swelling.  All other systems reviewed and are negative.      Objective:   Physical Exam Vitals reviewed.  HENT:     Head: Normocephalic.  Cardiovascular:     Rate and Rhythm: Normal rate.     Pulses: Normal pulses.          Dorsalis pedis pulses are 2+ on the right side and 2+ on the left side.       Posterior tibial pulses are 2+ on the right side and 2+ on the left side.  Pulmonary:     Effort: Pulmonary effort is normal.  Skin:    General: Skin is warm and dry.  Neurological:     Mental Status: She is alert and oriented to person, place, and time.  Psychiatric:        Mood and Affect: Mood normal.        Behavior: Behavior normal.        Thought Content: Thought content normal.        Judgment: Judgment normal.     BP (!) 164/83 (BP Location: Right Arm)   Pulse 86   Resp 16   Ht 5\' 6"  (1.676 m)   Wt 161 lb 6.4 oz (73.2 kg)   BMI 26.05 kg/m   Past Medical History:  Diagnosis Date   Cancer ([place])    Cataract    Diabetes mellitus without complication ([place])    Hyperlipidemia    Hypertension    Hypothyroidism    doctor's keeping an eye on thyroid    Lung cancer ([place])     Social History   Socioeconomic  History   Marital status: Single    Spouse name: Not on file   Number of children: Not on file   Years of education: Not on file   Highest education level: Not on file  Occupational History   Not on file  Tobacco Use   Smoking status: Never   Smokeless tobacco: Never  Vaping Use   Vaping Use: Never used  Substance and Sexual Activity   Alcohol use: No   Drug use: No   Sexual activity: Not Currently  Other Topics Concern   Not on file  Social History Narrative   Not on file   Social Determinants of Health   Financial Resource Strain: Not on file  Food Insecurity: Food Insecurity Present (07/08/2021)   Hunger Vital Sign    Worried About Running Out of Food in the Last Year: Sometimes true    Ran Out of Food in the Last Year: Sometimes true  Transportation Needs: No Transportation Needs (04/05/2022)   PRAPARE - Transportation    Lack of Transportation (Medical): No    Lack of  Transportation (Non-Medical): No  Physical Activity: Not on file  Stress: Not on file  Social Connections: Not on file  Intimate Partner Violence: Not on file    Past Surgical History:  Procedure Laterality Date   ABDOMINAL HYSTERECTOMY     IR CV LINE INJECTION  09/28/2021   PORTA CATH INSERTION N/A 11/30/2020   Procedure: PORTA CATH INSERTION;  Surgeon: Algernon Huxley, MD;  Location: [place] [facility];  Service: Cardiovascular;  Laterality: N/A;   VIDEO BRONCHOSCOPY WITH ENDOBRONCHIAL NAVIGATION N/A 10/21/2020   Procedure: VIDEO BRONCHOSCOPY WITH ENDOBRONCHIAL NAVIGATION;  Surgeon: Ottie Glazier, MD;  Location: [facility] ORS;  Service: Thoracic;  Laterality: N/A;   VIDEO BRONCHOSCOPY WITH ENDOBRONCHIAL ULTRASOUND N/A 10/21/2020   Procedure: VIDEO BRONCHOSCOPY WITH ENDOBRONCHIAL ULTRASOUND;  Surgeon: Ottie Glazier, MD;  Location: [facility] ORS;  Service: Thoracic;  Laterality: N/A;    Family History  Problem Relation Age of Onset   Cancer Mother        breast   Hypertension Mother    Breast cancer  Mother 13   Heart disease Father    Hyperlipidemia Brother    Heart disease Brother     No Known Allergies     Latest Ref Rng & Units 06/07/2022    9:14 AM 05/18/2022   10:07 AM 04/26/2022    8:26 AM  CBC  WBC 4.0 - 10.5 K/uL 3.5  3.2  3.9   Hemoglobin 12.0 - 15.0 g/dL 12.6  11.7  11.5   Hematocrit 36.0 - 46.0 % 38.5  35.3  35.2   Platelets 150 - 400 K/uL 197  205  221       CMP     Component Value Date/Time   NA 139 06/07/2022 0914   NA 141 03/09/2015 0000   K 3.8 06/07/2022 0914   CL 109 06/07/2022 0914   CO2 23 06/07/2022 0914   GLUCOSE 110 (H) 06/07/2022 0914   BUN 14 06/07/2022 0914   BUN 11 03/09/2015 0000   CREATININE 0.63 06/07/2022 0914   CALCIUM 10.0 06/07/2022 0914   PROT 7.4 06/07/2022 0914   ALBUMIN 4.2 06/07/2022 0914   AST 20 06/07/2022 0914   ALT 13 06/07/2022 0914   ALKPHOS 49 06/07/2022 0914   BILITOT 0.8 06/07/2022 0914   GFRNONAA >60 06/07/2022 0914     No results found.     Assessment & Plan:   1. Lymphedema Recommend:  No surgery or intervention at this point in time.  I have reviewed my discussion with the patient regarding venous insufficiency and why it causes symptoms. I have discussed with the patient the chronic skin changes that accompany venous insufficiency and the long term sequela such as ulceration. Patient will contnue wearing graduated compression stockings on a daily basis, as this has provided excellent control of his edema. The patient will put the stockings on first thing in the morning and removing them in the evening. The patient is reminded not to sleep in the stockings.  In addition, behavioral modification including elevation during the day will be initiated. Exercise is strongly encouraged.  Previous duplex ultrasound of the lower extremities shows normal deep system, no significant superficial reflux was identified.  Given the patient's good control and lack of any problems regarding the venous insufficiency and  lymphedema a lymph pump in not need at this time.    The patient will follow up with me PRN should anything change.  The patient voices agreement with this plan.   2. Primary  hypertension Continue antihypertensive medications as already ordered, these medications have been reviewed and there are no changes at this time.   3. Type 2 diabetes mellitus with other specified complication, unspecified whether long term insulin use ([place]) Continue hypoglycemic medications as already ordered, these medications have been reviewed and there are no changes at this time.  Hgb A1C to be monitored as already arranged by primary service   4. Bruit of right carotid artery Previous studies about 8 months ago showed no evidence of stenosis in the bilateral internal carotid arteries.  Follow-up as needed.   Current Outpatient Medications on File Prior to Visit  Medication Sig Dispense Refill   apixaban (ELIQUIS) 5 MG TABS tablet Take 1 tablet (5 mg total) by mouth 2 (two) times daily. 60 tablet 1   dexamethasone (DECADRON) 4 MG tablet Take 1 tablet (4 mg total) by mouth 2 (two) times daily with a meal. 60 tablet 1   furosemide (LASIX) 20 MG tablet Take 1 tablet (20 mg total) by mouth daily. 60 tablet 0   lansoprazole (PREVACID) 30 MG capsule Take 1 capsule (30 mg total) by mouth daily. 30 capsule 1   lidocaine-prilocaine (EMLA) cream Apply 1 application. topically as needed. Apply small amount to port site at least 1 hour prior to it being accessed, cover with plastic wrap 30 g 1   losartan (COZAAR) 25 MG tablet Take 25 mg by mouth daily.     metFORMIN (GLUCOPHAGE-XR) 500 MG 24 hr tablet Take by mouth.     naloxone (NARCAN) nasal spray 4 mg/0.1 mL      potassium chloride SA (KLOR-CON M) 20 MEQ tablet Take 2 tablets (40 mEq total) by mouth daily. 7 tablet 0   traZODone (DESYREL) 50 MG tablet Take by mouth.     fluticasone (FLONASE) 50 MCG/ACT nasal spray Place 2 sprays into both nostrils daily. (Patient not  taking: Reported on 06/07/2022) 16 g 5   LORazepam (ATIVAN) 0.5 MG tablet Take 1 tablet (0.5 mg total) by mouth every 6 (six) hours as needed (Nausea or vomiting). (Patient not taking: Reported on 09/28/2021) 30 tablet 0   mirtazapine (REMERON) 15 MG tablet Take 15 mg by mouth at bedtime. (Patient not taking: Reported on 12/21/2021)     ondansetron (ZOFRAN) 8 MG tablet Take 1 tablet (8 mg total) by mouth 2 (two) times daily as needed for refractory nausea / vomiting. Start on day 3 after carboplatin chemo. (Patient not taking: Reported on 10/19/2021) 30 tablet 1   oxycodone (ROXICODONE) 30 MG immediate release tablet Take 1 tablet (30 mg total) by mouth every 4 (four) hours as needed for pain. (Patient not taking: Reported on 06/07/2022) 180 tablet 0   polyethylene glycol (MIRALAX / GLYCOLAX) 17 g packet Take 17 g by mouth daily. (Patient not taking: Reported on 06/14/2022)     prochlorperazine (COMPAZINE) 10 MG tablet Take 1 tablet (10 mg total) by mouth every 6 (six) hours as needed (Nausea or vomiting). (Patient not taking: Reported on 10/19/2021) 30 tablet 1   Current Facility-Administered Medications on File Prior to Visit  Medication Dose Route Frequency Provider Last Rate Last Admin   sodium chloride flush (NS) 0.9 % injection 10 mL  10 mL Intravenous PRN Sindy Guadeloupe, MD   10 mL at 12/15/20 0850    There are no Patient Instructions on file for this visit. No follow-ups on file.   Kris Hartmann, NP

## 2022-06-28 ENCOUNTER — Inpatient Hospital Stay: Payer: Medicare HMO

## 2022-06-28 ENCOUNTER — Inpatient Hospital Stay (HOSPITAL_BASED_OUTPATIENT_CLINIC_OR_DEPARTMENT_OTHER): Payer: Medicare HMO | Admitting: Nurse Practitioner

## 2022-06-28 ENCOUNTER — Inpatient Hospital Stay: Payer: Medicare HMO | Attending: Oncology

## 2022-06-28 ENCOUNTER — Encounter: Payer: Self-pay | Admitting: Nurse Practitioner

## 2022-06-28 VITALS — BP 146/78 | HR 63 | Temp 97.0°F | Resp 16 | Wt 165.2 lb

## 2022-06-28 DIAGNOSIS — Z87891 Personal history of nicotine dependence: Secondary | ICD-10-CM | POA: Diagnosis not present

## 2022-06-28 DIAGNOSIS — G9389 Other specified disorders of brain: Secondary | ICD-10-CM | POA: Diagnosis not present

## 2022-06-28 DIAGNOSIS — Z7901 Long term (current) use of anticoagulants: Secondary | ICD-10-CM | POA: Diagnosis not present

## 2022-06-28 DIAGNOSIS — Z8349 Family history of other endocrine, nutritional and metabolic diseases: Secondary | ICD-10-CM | POA: Diagnosis not present

## 2022-06-28 DIAGNOSIS — Z7952 Long term (current) use of systemic steroids: Secondary | ICD-10-CM | POA: Insufficient documentation

## 2022-06-28 DIAGNOSIS — C3412 Malignant neoplasm of upper lobe, left bronchus or lung: Secondary | ICD-10-CM | POA: Diagnosis not present

## 2022-06-28 DIAGNOSIS — C787 Secondary malignant neoplasm of liver and intrahepatic bile duct: Secondary | ICD-10-CM | POA: Insufficient documentation

## 2022-06-28 DIAGNOSIS — Z8249 Family history of ischemic heart disease and other diseases of the circulatory system: Secondary | ICD-10-CM | POA: Diagnosis not present

## 2022-06-28 DIAGNOSIS — C7931 Secondary malignant neoplasm of brain: Secondary | ICD-10-CM | POA: Insufficient documentation

## 2022-06-28 DIAGNOSIS — I10 Essential (primary) hypertension: Secondary | ICD-10-CM | POA: Diagnosis not present

## 2022-06-28 DIAGNOSIS — C3490 Malignant neoplasm of unspecified part of unspecified bronchus or lung: Secondary | ICD-10-CM

## 2022-06-28 DIAGNOSIS — Z5112 Encounter for antineoplastic immunotherapy: Secondary | ICD-10-CM

## 2022-06-28 DIAGNOSIS — Z5941 Food insecurity: Secondary | ICD-10-CM | POA: Insufficient documentation

## 2022-06-28 DIAGNOSIS — Z803 Family history of malignant neoplasm of breast: Secondary | ICD-10-CM | POA: Insufficient documentation

## 2022-06-28 DIAGNOSIS — Z86718 Personal history of other venous thrombosis and embolism: Secondary | ICD-10-CM | POA: Insufficient documentation

## 2022-06-28 DIAGNOSIS — Z79899 Other long term (current) drug therapy: Secondary | ICD-10-CM | POA: Insufficient documentation

## 2022-06-28 LAB — TSH: TSH: 1.591 u[IU]/mL (ref 0.350–4.500)

## 2022-06-28 LAB — CBC WITH DIFFERENTIAL/PLATELET
Abs Immature Granulocytes: 0.01 10*3/uL (ref 0.00–0.07)
Basophils Absolute: 0 10*3/uL (ref 0.0–0.1)
Basophils Relative: 0 %
Eosinophils Absolute: 0.1 10*3/uL (ref 0.0–0.5)
Eosinophils Relative: 4 %
HCT: 36.5 % (ref 36.0–46.0)
Hemoglobin: 12 g/dL (ref 12.0–15.0)
Immature Granulocytes: 0 %
Lymphocytes Relative: 26 %
Lymphs Abs: 0.7 10*3/uL (ref 0.7–4.0)
MCH: 30.4 pg (ref 26.0–34.0)
MCHC: 32.9 g/dL (ref 30.0–36.0)
MCV: 92.4 fL (ref 80.0–100.0)
Monocytes Absolute: 0.4 10*3/uL (ref 0.1–1.0)
Monocytes Relative: 13 %
Neutro Abs: 1.6 10*3/uL — ABNORMAL LOW (ref 1.7–7.7)
Neutrophils Relative %: 57 %
Platelets: 188 10*3/uL (ref 150–400)
RBC: 3.95 MIL/uL (ref 3.87–5.11)
RDW: 13 % (ref 11.5–15.5)
WBC: 2.8 10*3/uL — ABNORMAL LOW (ref 4.0–10.5)
nRBC: 0 % (ref 0.0–0.2)

## 2022-06-28 LAB — COMPREHENSIVE METABOLIC PANEL
ALT: 11 U/L (ref 0–44)
AST: 16 U/L (ref 15–41)
Albumin: 3.9 g/dL (ref 3.5–5.0)
Alkaline Phosphatase: 64 U/L (ref 38–126)
Anion gap: 7 (ref 5–15)
BUN: 12 mg/dL (ref 8–23)
CO2: 23 mmol/L (ref 22–32)
Calcium: 9.7 mg/dL (ref 8.9–10.3)
Chloride: 110 mmol/L (ref 98–111)
Creatinine, Ser: 0.63 mg/dL (ref 0.44–1.00)
GFR, Estimated: >60 mL/min (ref >60)
Glucose, Bld: 111 mg/dL — ABNORMAL HIGH (ref 70–99)
Potassium: 3.7 mmol/L (ref 3.5–5.1)
Sodium: 140 mmol/L (ref 135–145)
Total Bilirubin: 0.5 mg/dL (ref 0.3–1.2)
Total Protein: 7.2 g/dL (ref 6.5–8.1)

## 2022-06-28 MED ORDER — SODIUM CHLORIDE 0.9 % IV SOLN
1200.0000 mg | Freq: Once | INTRAVENOUS | Status: AC
  Administered 2022-06-28: 1200 mg via INTRAVENOUS
  Filled 2022-06-28: qty 20

## 2022-06-28 MED ORDER — SODIUM CHLORIDE 0.9 % IV SOLN
Freq: Once | INTRAVENOUS | Status: AC
  Filled 2022-06-28: qty 250

## 2022-06-28 MED ORDER — HEPARIN SOD (PORK) LOCK FLUSH 100 UNIT/ML IV SOLN
500.0000 [IU] | Freq: Once | INTRAVENOUS | Status: AC | PRN
  Administered 2022-06-28: 500 [IU]
  Filled 2022-06-28: qty 5

## 2022-06-28 MED ORDER — SODIUM CHLORIDE 0.9% FLUSH
10.0000 mL | INTRAVENOUS | Status: DC | PRN
  Administered 2022-06-28: 10 mL via INTRAVENOUS
  Filled 2022-06-28: qty 10

## 2022-06-28 NOTE — Patient Instructions (Signed)
[facility]  Discharge Instructions: Thank you for choosing [facility] to provide your oncology and hematology care.  If you have a lab appointment with the Cancer Center, please go directly to the Cancer Center and check in at the registration area.  Wear comfortable clothing and clothing appropriate for easy access to any Portacath or PICC line.   We strive to give you quality time with your provider. You may need to reschedule your appointment if you arrive late (15 or more minutes).  Arriving late affects you and other patients whose appointments are after yours.  Also, if you miss three or more appointments without notifying the office, you may be dismissed from the clinic at the provider's discretion.      For prescription refill requests, have your pharmacy contact our office and allow 72 hours for refills to be completed.       To help prevent nausea and vomiting after your treatment, we encourage you to take your nausea medication as directed.  BELOW ARE SYMPTOMS THAT SHOULD BE REPORTED IMMEDIATELY: *FEVER GREATER THAN 100.4 F (38 C) OR HIGHER *CHILLS OR SWEATING *NAUSEA AND VOMITING THAT IS NOT CONTROLLED WITH YOUR NAUSEA MEDICATION *UNUSUAL SHORTNESS OF BREATH *UNUSUAL BRUISING OR BLEEDING *URINARY PROBLEMS (pain or burning when urinating, or frequent urination) *BOWEL PROBLEMS (unusual diarrhea, constipation, pain near the anus) TENDERNESS IN MOUTH AND THROAT WITH OR WITHOUT PRESENCE OF ULCERS (sore throat, sores in mouth, or a toothache) UNUSUAL RASH, SWELLING OR PAIN  UNUSUAL VAGINAL DISCHARGE OR ITCHING   Items with * indicate a potential emergency and should be followed up as soon as possible or go to the Emergency Department if any problems should occur.  Please show the CHEMOTHERAPY ALERT CARD or IMMUNOTHERAPY ALERT CARD at check-in to the Emergency Department and triage nurse.  Should you have questions after your  visit or need to cancel or reschedule your appointment, please contact [facility]  [phone] and follow the prompts.  Office hours are 8:00 a.m. to 4:30 p.m. Monday - Friday. Please note that voicemails left after 4:00 p.m. may not be returned until the following business day.  We are closed weekends and major holidays. You have access to a nurse at all times for urgent questions. Please call the main number to the clinic [phone] and follow the prompts.  For any non-urgent questions, you may also contact your provider using MyChart. We now offer e-Visits for anyone 18 and older to request care online for non-urgent symptoms. For details visit [email or web address].   Also download the MyChart app! Go to the app store, search "MyChart", open the app, select [facility], and log in with your MyChart username and password.  Masks are optional in the cancer centers. If you would like for your care team to wear a mask while they are taking care of you, please let them know. For doctor visits, patients may have with them one support person who is at least 72 years old. At this time, visitors are not allowed in the infusion area.   

## 2022-06-28 NOTE — Progress Notes (Signed)
Pt states she has noticed that she has been forgetting stuff lately or catches herself repaeatly herself alot.

## 2022-06-28 NOTE — Progress Notes (Signed)
Hematology/Oncology Consult Note [facility]  [phone] [phone]  Patient Care Team: Leonel Ramsay, MD as PCP - General (Infectious Diseases) Telford Nab, RN as Oncology Nurse Navigator Sindy Guadeloupe, MD as Consulting Physician (Hematology and Oncology)   Name of the patient: Doris Johnson  [id number]  1951-08-26   Date of visit: 06/28/22  Diagnosis- extensive stage small cell lung cancer with liver and brain metastases  Chief complaint/ Reason for visit-on treatment assessment prior to cycle 22 of maintenance Tecentriq  Heme/Onc history: Patient is a 71 year old female with a remote history of smoking in her teenage years and exposure to passive smoking.  She has been having ongoing midsternal pain which has been growing since the last 6 to 7 months.  She had been to urgent care as well in the past.  She finally had a CT chest with contrast done in October 2021 which showed a large mass in the left side of the mediastinum measuring 6.7 x 6.2 x 6.1 cm causing severe compression of the left main pulmonary artery and around the aortic arch in the left subclavian artery.  Enlarged thyroid gland.  Left upper lobe nodule 1 x 0.7 cm.  She then underwent bronchoscopy with Dr.Aleskerov left upper lobe biopsy was nondiagnostic.  However station 4, 7 and 10 L lymph nodes were consistent with metastatic small cell carcinoma.   PET CT scan showed enlarged level 3 cervical lymph node 2.2 cm that was hypermetabolic with an SUV of 9.7.  Large central 7.3 cm AP window mass.  5.7 cm left liver mass.  MRI brain with and without contrast showed 2 small foci of enhancement in the right cerebellar hemisphere and right temporal lobe without mass-effect or edema as well concerning for brain metastases   Patient had 2 cycles of carbo etoposide chemotherapy along with Tecentriq and subsequent scan showed no significant improvement in the primary lung mass or liver  mass.  She received radiation treatment to her primary lung mass and also seen by [facility] for second opinion.  Her pathology was reviewed at [facility] and reported as possible neuroendocrine neoplasm But stated that small cell carcinoma cannot be excluded but not definitely diagnostic for it.  However [facility] pathology feels that this is consistent with small cell lung cancer.  She has completed 4 cycles of carbo etoposide Tecentriq chemotherapy and is currently on maintenance Tecentriq.  Repeat liver biopsy was also performed and was consistent with small cell lung cancer   Patient has had stable disease on maintenance Tecentriq so far and she has been on single agent Tecentriq since April 2022   New brain mets in march 2023. She completed WBRT   Renal vein thrombosis In June 2023 on Eliquis for incidentally noted  Interval history - Doris Johnson is a 71 y.o. female who returns to clinic for consideration of cycle 22 of tecentriq. Complains of brain fog and forgetfulness.  No worse.  Has occurred since she underwent whole brain radiation.  Denies vision changes, nausea, imbalance, or headache.  No shortness of breath, cough, hemoptysis.  Tolerating Tecentriq well.  Denies other complaints today.  ECOG PS- 1 Pain scale- 0  Review of systems- Review of Systems  Constitutional:  Negative for chills, fever, malaise/fatigue and weight loss.  HENT:  Negative for congestion, ear discharge and nosebleeds.   Eyes:  Negative for blurred vision.  Respiratory:  Negative for cough, hemoptysis, sputum production, shortness of breath and wheezing.   Cardiovascular:  Negative  for chest pain, palpitations, orthopnea and claudication.  Gastrointestinal:  Negative for abdominal pain, blood in stool, constipation, diarrhea, heartburn, melena, nausea and vomiting.  Genitourinary:  Negative for dysuria, flank pain, frequency, hematuria and urgency.  Musculoskeletal:  Negative for back pain, joint pain and myalgias.   Skin:  Negative for rash.  Neurological:  Negative for dizziness, tingling, focal weakness, seizures, weakness and headaches.  Endo/Heme/Allergies:  Does not bruise/bleed easily.  Psychiatric/Behavioral:  Negative for depression and suicidal ideas. The patient does not have insomnia.     No Known Allergies  Past Medical History:  Diagnosis Date   Cancer ([place])    Cataract    Diabetes mellitus without complication ([place])    Hyperlipidemia    Hypertension    Hypothyroidism    doctor's keeping an eye on thyroid    Lung cancer [facility])    Past Surgical History:  Procedure Laterality Date   ABDOMINAL HYSTERECTOMY     IR CV LINE INJECTION  09/28/2021   PORTA CATH INSERTION N/A 11/30/2020   Procedure: PORTA CATH INSERTION;  Surgeon: Algernon Huxley, MD;  Location: [facility];  Service: Cardiovascular;  Laterality: N/A;   VIDEO BRONCHOSCOPY WITH ENDOBRONCHIAL NAVIGATION N/A 10/21/2020   Procedure: VIDEO BRONCHOSCOPY WITH ENDOBRONCHIAL NAVIGATION;  Surgeon: Ottie Glazier, MD;  Location: [facility] ORS;  Service: Thoracic;  Laterality: N/A;   VIDEO BRONCHOSCOPY WITH ENDOBRONCHIAL ULTRASOUND N/A 10/21/2020   Procedure: VIDEO BRONCHOSCOPY WITH ENDOBRONCHIAL ULTRASOUND;  Surgeon: Ottie Glazier, MD;  Location: [facility] OR[facility];  Service: Thoracic;  Laterality: N/A;   Social History   Socioeconomic History   Marital status: Single    Spouse name: Not on file   Number of children: Not on file   Years of education: Not on file   Highest education level: Not on file  Occupational History   Not on file  Tobacco Use   Smoking status: Never   Smokeless tobacco: Never  Vaping Use   Vaping Use: Never used  Substance and Sexual Activity   Alcohol use: No   Drug use: No   Sexual activity: Not Currently  Other Topics Concern   Not on file  Social History Narrative   Not on file   Social Determinants of Health   Financial Resource Strain: Not on file  Food Insecurity: Food Insecurity Present  (07/08/2021)   Hunger Vital Sign    Worried About Running Out of Food in the Last Year: Sometimes true    Ran Out of Food in the Last Year: Sometimes true  Transportation Needs: No Transportation Needs (04/05/2022)   PRAPARE - Hydrologist (Medical): No    Lack of Transportation (Non-Medical): No  Physical Activity: Not on file  Stress: Not on file  Social Connections: Not on file  Intimate Partner Violence: Not on file   Family History  Problem Relation Age of Onset   Cancer Mother        breast   Hypertension Mother    Breast cancer Mother 53   Heart disease Father    Hyperlipidemia Brother    Heart disease Brother    Current Outpatient Medications:    apixaban (ELIQUIS) 5 MG TABS tablet, Take 1 tablet (5 mg total) by mouth 2 (two) times daily., Disp: 60 tablet, Rfl: 1   dexamethasone (DECADRON) 4 MG tablet, Take 1 tablet (4 mg total) by mouth 2 (two) times daily with a meal., Disp: 60 tablet, Rfl: 1   furosemide (LASIX) 20 MG  tablet, Take 1 tablet (20 mg total) by mouth daily., Disp: 60 tablet, Rfl: 0   lansoprazole (PREVACID) 30 MG capsule, Take 1 capsule (30 mg total) by mouth daily., Disp: 30 capsule, Rfl: 1   lidocaine-prilocaine (EMLA) cream, Apply 1 application. topically as needed. Apply small amount to port site at least 1 hour prior to it being accessed, cover with plastic wrap, Disp: 30 g, Rfl: 1   losartan (COZAAR) 25 MG tablet, Take 25 mg by mouth daily., Disp: , Rfl:    metFORMIN (GLUCOPHAGE-XR) 500 MG 24 hr tablet, Take by mouth., Disp: , Rfl:    potassium chloride SA (KLOR-CON M) 20 MEQ tablet, Take 2 tablets (40 mEq total) by mouth daily., Disp: 7 tablet, Rfl: 0   traZODone (DESYREL) 50 MG tablet, Take by mouth., Disp: , Rfl:    fluticasone (FLONASE) 50 MCG/ACT nasal spray, Place 2 sprays into both nostrils daily. (Patient not taking: Reported on 06/07/2022), Disp: 16 g, Rfl: 5   LORazepam (ATIVAN) 0.5 MG tablet, Take 1 tablet (0.5 mg  total) by mouth every 6 (six) hours as needed (Nausea or vomiting). (Patient not taking: Reported on 09/28/2021), Disp: 30 tablet, Rfl: 0   mirtazapine (REMERON) 15 MG tablet, Take 15 mg by mouth at bedtime. (Patient not taking: Reported on 12/21/2021), Disp: , Rfl:    naloxone (NARCAN) nasal spray 4 mg/0.1 mL, , Disp: , Rfl:    ondansetron (ZOFRAN) 8 MG tablet, Take 1 tablet (8 mg total) by mouth 2 (two) times daily as needed for refractory nausea / vomiting. Start on day 3 after carboplatin chemo. (Patient not taking: Reported on 10/19/2021), Disp: 30 tablet, Rfl: 1   oxycodone (ROXICODONE) 30 MG immediate release tablet, Take 1 tablet (30 mg total) by mouth every 4 (four) hours as needed for pain. (Patient not taking: Reported on 06/07/2022), Disp: 180 tablet, Rfl: 0   polyethylene glycol (MIRALAX / GLYCOLAX) 17 g packet, Take 17 g by mouth daily. (Patient not taking: Reported on 06/14/2022), Disp: , Rfl:    prochlorperazine (COMPAZINE) 10 MG tablet, Take 1 tablet (10 mg total) by mouth every 6 (six) hours as needed (Nausea or vomiting). (Patient not taking: Reported on 10/19/2021), Disp: 30 tablet, Rfl: 1 No current facility-administered medications for this visit.  Facility-Administered Medications Ordered in Other Visits:    sodium chloride flush (NS) 0.9 % injection 10 mL, 10 mL, Intravenous, PRN, Sindy Guadeloupe, MD, 10 mL at 12/15/20 0850   sodium chloride flush (NS) 0.9 % injection 10 mL, 10 mL, Intravenous, PRN, Sindy Guadeloupe, MD, 10 mL at 06/28/22 0945  Physical exam:  Vitals:   06/28/22 1001  BP: (!) 146/78  Pulse: 63  Resp: 16  Temp: (!) 97 F (36.1 C)  SpO2: 100%  Weight: 165 lb 3.2 oz (74.9 kg)   Physical Exam Cardiovascular:     Rate and Rhythm: Normal rate and regular rhythm.     Heart sounds: Normal heart sounds.  Pulmonary:     Effort: Pulmonary effort is normal.     Breath sounds: Normal breath sounds.  Abdominal:     General: Bowel sounds are normal.     Palpations:  Abdomen is soft.  Skin:    General: Skin is warm and dry.  Neurological:     Mental Status: She is alert and oriented to person, place, and time.        Latest Ref Rng & Units 06/28/2022    9:45 AM  CMP  Glucose  70 - 99 mg/dL 111   BUN 8 - 23 mg/dL 12   Creatinine 0.44 - 1.00 mg/dL 0.63   Sodium 135 - 145 mmol/L 140   Potassium 3.5 - 5.1 mmol/L 3.7   Chloride 98 - 111 mmol/L 110   CO2 22 - 32 mmol/L 23   Calcium 8.9 - 10.3 mg/dL 9.7   Total Protein 6.5 - 8.1 g/dL 7.2   Total Bilirubin 0.3 - 1.2 mg/dL 0.5   Alkaline Phos 38 - 126 U/L 64   AST 15 - 41 U/L 16   ALT 0 - 44 U/L 11       Latest Ref Rng & Units 06/28/2022    9:45 AM  CBC  WBC 4.0 - 10.5 K/uL 2.8   Hemoglobin 12.0 - 15.0 g/dL 12.0   Hematocrit 36.0 - 46.0 % 36.5   Platelets 150 - 400 K/uL 188    No images are attached to the encounter.  No results found.  Assessment and plan- Patient is a 71 y.o. female with extensive stage small cell lung cancer with liver and brain metastases here for on treatment assessment prior to cycle 22 of maintenance atezolizumab/Tecentriq.   ANC 1.6 today.  OK to proceed. Monitor. Reviewed neutropenic precautions.   Otherwise, labs acceptable to proceed.   Brain fog & forgetfulness- hx of brain metastases s/p wbrt. Imaging in June revealed improvement in disease burden and/or resolution of visible nodules. No evidence of progressive disease. No worse. No Last imaging in early June. Plan for scans prior to next cycle. Ordered today. She will see Dr. Janese Banks for results.   History of renal vein thrombosis: On Eliquis  Goals of care- treatment given with palliative intent. Plan to continue until progressive disease or unacceptable toxicity.   Visit Diagnosis 1. Small cell carcinoma of lung metastatic to liver ([place])   2. Encounter for antineoplastic immunotherapy    Beckey Rutter, [place], AGNP-C [place] at [facility] [phone] (clinic) 06/28/2022

## 2022-07-11 ENCOUNTER — Ambulatory Visit
Admission: RE | Admit: 2022-07-11 | Discharge: 2022-07-11 | Disposition: A | Discharge status: Home / Self Care | Payer: Medicare HMO | Source: Ambulatory Visit | Attending: Nurse Practitioner | Admitting: Nurse Practitioner

## 2022-07-11 DIAGNOSIS — G9389 Other specified disorders of brain: Secondary | ICD-10-CM

## 2022-07-11 DIAGNOSIS — C7931 Secondary malignant neoplasm of brain: Secondary | ICD-10-CM | POA: Diagnosis not present

## 2022-07-11 DIAGNOSIS — C3490 Malignant neoplasm of unspecified part of unspecified bronchus or lung: Secondary | ICD-10-CM

## 2022-07-11 DIAGNOSIS — C787 Secondary malignant neoplasm of liver and intrahepatic bile duct: Secondary | ICD-10-CM | POA: Diagnosis not present

## 2022-07-11 DIAGNOSIS — K769 Liver disease, unspecified: Secondary | ICD-10-CM | POA: Diagnosis not present

## 2022-07-11 MED ORDER — IOHEXOL 300 MG/ML  SOLN
100.0000 mL | Freq: Once | INTRAMUSCULAR | Status: AC | PRN
  Administered 2022-07-11: 100 mL via INTRAVENOUS

## 2022-07-11 MED ORDER — GADOBUTROL 1 MMOL/ML IV SOLN
7.0000 mL | Freq: Once | INTRAVENOUS | Status: AC | PRN
  Administered 2022-07-11: 7 mL via INTRAVENOUS

## 2022-07-19 ENCOUNTER — Inpatient Hospital Stay: Payer: Medicare HMO

## 2022-07-19 ENCOUNTER — Encounter: Payer: Self-pay | Admitting: Oncology

## 2022-07-19 ENCOUNTER — Inpatient Hospital Stay: Payer: Medicare HMO | Attending: Oncology

## 2022-07-19 ENCOUNTER — Inpatient Hospital Stay (HOSPITAL_BASED_OUTPATIENT_CLINIC_OR_DEPARTMENT_OTHER): Payer: Medicare HMO | Admitting: Oncology

## 2022-07-19 VITALS — BP 157/72 | HR 57 | Temp 98.5°F | Resp 16 | Wt 165.4 lb

## 2022-07-19 DIAGNOSIS — I10 Essential (primary) hypertension: Secondary | ICD-10-CM | POA: Insufficient documentation

## 2022-07-19 DIAGNOSIS — Z86718 Personal history of other venous thrombosis and embolism: Secondary | ICD-10-CM | POA: Diagnosis not present

## 2022-07-19 DIAGNOSIS — C7931 Secondary malignant neoplasm of brain: Secondary | ICD-10-CM | POA: Diagnosis not present

## 2022-07-19 DIAGNOSIS — C787 Secondary malignant neoplasm of liver and intrahepatic bile duct: Secondary | ICD-10-CM | POA: Diagnosis not present

## 2022-07-19 DIAGNOSIS — Z7722 Contact with and (suspected) exposure to environmental tobacco smoke (acute) (chronic): Secondary | ICD-10-CM | POA: Diagnosis not present

## 2022-07-19 DIAGNOSIS — Z5941 Food insecurity: Secondary | ICD-10-CM | POA: Diagnosis not present

## 2022-07-19 DIAGNOSIS — Z5112 Encounter for antineoplastic immunotherapy: Secondary | ICD-10-CM

## 2022-07-19 DIAGNOSIS — E049 Nontoxic goiter, unspecified: Secondary | ICD-10-CM | POA: Insufficient documentation

## 2022-07-19 DIAGNOSIS — Z8349 Family history of other endocrine, nutritional and metabolic diseases: Secondary | ICD-10-CM | POA: Insufficient documentation

## 2022-07-19 DIAGNOSIS — K760 Fatty (change of) liver, not elsewhere classified: Secondary | ICD-10-CM | POA: Diagnosis not present

## 2022-07-19 DIAGNOSIS — J8410 Pulmonary fibrosis, unspecified: Secondary | ICD-10-CM | POA: Insufficient documentation

## 2022-07-19 DIAGNOSIS — Z5189 Encounter for other specified aftercare: Secondary | ICD-10-CM | POA: Insufficient documentation

## 2022-07-19 DIAGNOSIS — Z803 Family history of malignant neoplasm of breast: Secondary | ICD-10-CM | POA: Insufficient documentation

## 2022-07-19 DIAGNOSIS — Z87891 Personal history of nicotine dependence: Secondary | ICD-10-CM | POA: Diagnosis not present

## 2022-07-19 DIAGNOSIS — J984 Other disorders of lung: Secondary | ICD-10-CM | POA: Diagnosis not present

## 2022-07-19 DIAGNOSIS — I700 Atherosclerosis of aorta: Secondary | ICD-10-CM | POA: Diagnosis not present

## 2022-07-19 DIAGNOSIS — Z5111 Encounter for antineoplastic chemotherapy: Secondary | ICD-10-CM | POA: Diagnosis not present

## 2022-07-19 DIAGNOSIS — Z8249 Family history of ischemic heart disease and other diseases of the circulatory system: Secondary | ICD-10-CM | POA: Insufficient documentation

## 2022-07-19 DIAGNOSIS — Z9221 Personal history of antineoplastic chemotherapy: Secondary | ICD-10-CM | POA: Insufficient documentation

## 2022-07-19 DIAGNOSIS — E876 Hypokalemia: Secondary | ICD-10-CM | POA: Diagnosis not present

## 2022-07-19 DIAGNOSIS — C3412 Malignant neoplasm of upper lobe, left bronchus or lung: Secondary | ICD-10-CM | POA: Insufficient documentation

## 2022-07-19 DIAGNOSIS — D72819 Decreased white blood cell count, unspecified: Secondary | ICD-10-CM | POA: Diagnosis not present

## 2022-07-19 DIAGNOSIS — Z7901 Long term (current) use of anticoagulants: Secondary | ICD-10-CM | POA: Diagnosis not present

## 2022-07-19 DIAGNOSIS — E119 Type 2 diabetes mellitus without complications: Secondary | ICD-10-CM | POA: Diagnosis not present

## 2022-07-19 DIAGNOSIS — C3490 Malignant neoplasm of unspecified part of unspecified bronchus or lung: Secondary | ICD-10-CM

## 2022-07-19 DIAGNOSIS — E042 Nontoxic multinodular goiter: Secondary | ICD-10-CM | POA: Diagnosis not present

## 2022-07-19 DIAGNOSIS — Z79899 Other long term (current) drug therapy: Secondary | ICD-10-CM | POA: Diagnosis not present

## 2022-07-19 LAB — CBC WITH DIFFERENTIAL/PLATELET
Abs Immature Granulocytes: 0.01 10*3/uL (ref 0.00–0.07)
Basophils Absolute: 0 10*3/uL (ref 0.0–0.1)
Basophils Relative: 0 %
Eosinophils Absolute: 0.1 10*3/uL (ref 0.0–0.5)
Eosinophils Relative: 3 %
HCT: 35.4 % — ABNORMAL LOW (ref 36.0–46.0)
Hemoglobin: 11.9 g/dL — ABNORMAL LOW (ref 12.0–15.0)
Immature Granulocytes: 0 %
Lymphocytes Relative: 18 %
Lymphs Abs: 0.6 10*3/uL — ABNORMAL LOW (ref 0.7–4.0)
MCH: 30.5 pg (ref 26.0–34.0)
MCHC: 33.6 g/dL (ref 30.0–36.0)
MCV: 90.8 fL (ref 80.0–100.0)
Monocytes Absolute: 0.4 10*3/uL (ref 0.1–1.0)
Monocytes Relative: 13 %
Neutro Abs: 2.1 10*3/uL (ref 1.7–7.7)
Neutrophils Relative %: 66 %
Platelets: 212 10*3/uL (ref 150–400)
RBC: 3.9 MIL/uL (ref 3.87–5.11)
RDW: 13.3 % (ref 11.5–15.5)
WBC: 3.1 10*3/uL — ABNORMAL LOW (ref 4.0–10.5)
nRBC: 0 % (ref 0.0–0.2)

## 2022-07-19 LAB — COMPREHENSIVE METABOLIC PANEL
ALT: 11 U/L (ref 0–44)
AST: 16 U/L (ref 15–41)
Albumin: 4 g/dL (ref 3.5–5.0)
Alkaline Phosphatase: 56 U/L (ref 38–126)
Anion gap: 10 (ref 5–15)
BUN: 12 mg/dL (ref 8–23)
CO2: 24 mmol/L (ref 22–32)
Calcium: 9.4 mg/dL (ref 8.9–10.3)
Chloride: 108 mmol/L (ref 98–111)
Creatinine, Ser: 0.65 mg/dL (ref 0.44–1.00)
GFR, Estimated: >60 mL/min (ref >60)
Glucose, Bld: 105 mg/dL — ABNORMAL HIGH (ref 70–99)
Potassium: 3.4 mmol/L — ABNORMAL LOW (ref 3.5–5.1)
Sodium: 142 mmol/L (ref 135–145)
Total Bilirubin: 0.7 mg/dL (ref 0.3–1.2)
Total Protein: 6.9 g/dL (ref 6.5–8.1)

## 2022-07-19 MED ORDER — HEPARIN SOD (PORK) LOCK FLUSH 100 UNIT/ML IV SOLN
500.0000 [IU] | Freq: Once | INTRAVENOUS | Status: AC
  Administered 2022-07-19: 500 [IU] via INTRAVENOUS
  Filled 2022-07-19: qty 5

## 2022-07-19 MED ORDER — HEPARIN SOD (PORK) LOCK FLUSH 100 UNIT/ML IV SOLN
500.0000 [IU] | Freq: Once | INTRAVENOUS | Status: DC | PRN
  Filled 2022-07-19: qty 5

## 2022-07-19 MED ORDER — SODIUM CHLORIDE 0.9% FLUSH
10.0000 mL | Freq: Once | INTRAVENOUS | Status: AC
  Administered 2022-07-19: 10 mL via INTRAVENOUS
  Filled 2022-07-19: qty 10

## 2022-07-19 MED ORDER — SODIUM CHLORIDE 0.9 % IV SOLN
Freq: Once | INTRAVENOUS | Status: AC
  Filled 2022-07-19: qty 250

## 2022-07-19 MED ORDER — SODIUM CHLORIDE 0.9 % IV SOLN
1200.0000 mg | Freq: Once | INTRAVENOUS | Status: AC
  Administered 2022-07-19: 1200 mg via INTRAVENOUS
  Filled 2022-07-19: qty 20

## 2022-07-19 NOTE — Progress Notes (Signed)
Hematology/Oncology Consult note [facility]  Telephone:([phone] [phone]  Patient Care Team: Leonel Ramsay, MD as PCP - General (Infectious Diseases) Telford Nab, RN as Oncology Nurse Navigator Sindy Guadeloupe, MD as Consulting Physician (Hematology and Oncology)   Name of the patient: Doris Johnson  [id number]  05-01-1951   Date of visit: 07/19/22  Diagnosis- extensive stage small cell lung cancer with liver and brain metastases    Chief complaint/ Reason for visit-on treatment assessment prior to cycle 23 of maintenance Tecentriq and discuss CT scan results and further management  Heme/Onc history:  Patient is a 71 year old female with a remote history of smoking in her teenage years and exposure to passive smoking.  She has been having ongoing midsternal pain which has been growing since the last 6 to 7 months.  She had been to urgent care as well in the past.  She finally had a CT chest with contrast done in October 2021 which showed a large mass in the left side of the mediastinum measuring 6.7 x 6.2 x 6.1 cm causing severe compression of the left main pulmonary artery and around the aortic arch in the left subclavian artery.  Enlarged thyroid gland.  Left upper lobe nodule 1 x 0.7 cm.  She then underwent bronchoscopy with Dr.Aleskerov left upper lobe biopsy was nondiagnostic.  However station 4, 7 and 10 L lymph nodes were consistent with metastatic small cell carcinoma.   PET CT scan showed enlarged level 3 cervical lymph node 2.2 cm that was hypermetabolic with an SUV of 9.7.  Large central 7.3 cm AP window mass.  5.7 cm left liver mass.  MRI brain with and without contrast showed 2 small foci of enhancement in the right cerebellar hemisphere and right temporal lobe without mass-effect or edema as well concerning for brain metastases   Patient had 2 cycles of carbo etoposide chemotherapy along with Tecentriq and subsequent scan showed  no significant improvement in the primary lung mass or liver mass.  She received radiation treatment to her primary lung mass and also seen by [facility] for second opinion.  Her pathology was reviewed at [facility] and reported as possible neuroendocrine neoplasm But stated that small cell carcinoma cannot be excluded but not definitely diagnostic for it.  However [facility] pathology feels that this is consistent with small cell lung cancer.  She has completed 4 cycles of carbo etoposide Tecentriq chemotherapy and is currently on maintenance Tecentriq.  Repeat liver biopsy was also performed and was consistent with small cell lung cancer   Patient has had stable disease on maintenance Tecentriq so far and she has been on single agent Tecentriq since April 2022    new brain mets in march 2023. 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  She reports doing well. Appetite And weight have remained stable.  Denies any new pain  ECOG PS- 1 Pain scale- 0   Review of systems- Review of Systems  Constitutional:  Negative for chills, fever, malaise/fatigue and weight loss.  HENT:  Negative for congestion, ear discharge and nosebleeds.   Eyes:  Negative for blurred vision.  Respiratory:  Negative for cough, hemoptysis, sputum production, shortness of breath and wheezing.   Cardiovascular:  Negative for chest pain, palpitations, orthopnea and claudication.  Gastrointestinal:  Negative for abdominal pain, blood in stool, constipation, diarrhea, heartburn, melena, nausea and vomiting.  Genitourinary:  Negative for dysuria, flank pain, frequency, hematuria and urgency.  Musculoskeletal:  Negative for back pain, joint pain and myalgias.  Skin:  Negative for rash.  Neurological:  Negative for dizziness, tingling, focal weakness, seizures, weakness and headaches.  Endo/Heme/Allergies:  Does not bruise/bleed easily.   Psychiatric/Behavioral:  Negative for depression and suicidal ideas. The patient does not have insomnia.       No Known Allergies   Past Medical History:  Diagnosis Date   Cancer ([place])    Cataract    Diabetes mellitus without complication ([place])    Hyperlipidemia    Hypertension    Hypothyroidism    doctor's keeping an eye on thyroid    Lung cancer [facility])      Past Surgical History:  Procedure Laterality Date   ABDOMINAL HYSTERECTOMY     IR CV LINE INJECTION  09/28/2021   PORTA CATH INSERTION N/A 11/30/2020   Procedure: PORTA CATH INSERTION;  Surgeon: Algernon Huxley, MD;  Location: [facility];  Service: Cardiovascular;  Laterality: N/A;   VIDEO BRONCHOSCOPY WITH ENDOBRONCHIAL NAVIGATION N/A 10/21/2020   Procedure: VIDEO BRONCHOSCOPY WITH ENDOBRONCHIAL NAVIGATION;  Surgeon: Ottie Glazier, MD;  Location: [facility] ORS;  Service: Thoracic;  Laterality: N/A;   VIDEO BRONCHOSCOPY WITH ENDOBRONCHIAL ULTRASOUND N/A 10/21/2020   Procedure: VIDEO BRONCHOSCOPY WITH ENDOBRONCHIAL ULTRASOUND;  Surgeon: Ottie Glazier, MD;  Location: [facility] ORS;  Service: Thoracic;  Laterality: N/A;    Social History   Socioeconomic History   Marital status: Single    Spouse name: Not on file   Number of children: Not on file   Years of education: Not on file   Highest education level: Not on file  Occupational History   Not on file  Tobacco Use   Smoking status: Never   Smokeless tobacco: Never  Vaping Use   Vaping Use: Never used  Substance and Sexual Activity   Alcohol use: No   Drug use: No   Sexual activity: Not Currently  Other Topics Concern   Not on file  Social History Narrative   Not on file   Social Determinants of Health   Financial Resource Strain: Not on file  Food Insecurity: Food Insecurity Present (07/08/2021)   Hunger Vital Sign    Worried About Running Out of Food in the Last Year: Sometimes true    Ran Out of Food in the Last Year: Sometimes true  Transportation  Needs: No Transportation Needs (04/05/2022)   PRAPARE - Hydrologist (Medical): No    Lack of Transportation (Non-Medical): No  Physical Activity: Not on file  Stress: Not on file  Social Connections: Not on file  Intimate Partner Violence: Not on file    Family History  Problem Relation Age of Onset   Cancer Mother        breast   Hypertension Mother    Breast cancer Mother 19   Heart disease Father    Hyperlipidemia Brother    Heart disease Brother      Current Outpatient Medications:    apixaban (ELIQUIS) 5 MG TABS tablet, Take 1 tablet (5 mg total) by mouth 2 (two) times daily., Disp: 60 tablet, Rfl: 1   dexamethasone (DECADRON) 4 MG tablet, Take 1 tablet (4 mg total) by mouth 2 (two) times daily with a meal., Disp: 60 tablet, Rfl: 1   furosemide (LASIX) 20 MG tablet, Take 1 tablet (20 mg total) by mouth daily., Disp: 60 tablet, Rfl: 0  lansoprazole (PREVACID) 30 MG capsule, Take 1 capsule (30 mg total) by mouth daily., Disp: 30 capsule, Rfl: 1   lidocaine-prilocaine (EMLA) cream, Apply 1 application. topically as needed. Apply small amount to port site at least 1 hour prior to it being accessed, cover with plastic wrap, Disp: 30 g, Rfl: 1   losartan (COZAAR) 25 MG tablet, Take 25 mg by mouth daily., Disp: , Rfl:    metFORMIN (GLUCOPHAGE-XR) 500 MG 24 hr tablet, Take by mouth., Disp: , Rfl:    potassium chloride SA (KLOR-CON M) 20 MEQ tablet, Take 2 tablets (40 mEq total) by mouth daily., Disp: 7 tablet, Rfl: 0   fluticasone (FLONASE) 50 MCG/ACT nasal spray, Place 2 sprays into both nostrils daily. (Patient not taking: Reported on 06/07/2022), Disp: 16 g, Rfl: 5   mirtazapine (REMERON) 15 MG tablet, Take 15 mg by mouth at bedtime. (Patient not taking: Reported on 12/21/2021), Disp: , Rfl:    naloxone (NARCAN) nasal spray 4 mg/0.1 mL, , Disp: , Rfl:    oxycodone (ROXICODONE) 30 MG immediate release tablet, Take 1 tablet (30 mg total) by mouth every 4  (four) hours as needed for pain. (Patient not taking: Reported on 06/07/2022), Disp: 180 tablet, Rfl: 0   polyethylene glycol (MIRALAX / GLYCOLAX) 17 g packet, Take 17 g by mouth daily. (Patient not taking: Reported on 07/19/2022), Disp: , Rfl:    traZODone (DESYREL) 50 MG tablet, Take by mouth. (Patient not taking: Reported on 07/19/2022), Disp: , Rfl:  No current facility-administered medications for this visit.  Facility-Administered Medications Ordered in Other Visits:    heparin lock flush 100 unit/mL, 500 Units, Intracatheter, Once PRN, Sindy Guadeloupe, MD   sodium chloride flush (NS) 0.9 % injection 10 mL, 10 mL, Intravenous, PRN, Sindy Guadeloupe, MD, 10 mL at 12/15/20 0850  Physical exam:  Vitals:   07/19/22 0938  BP: (!) 157/72  Pulse: (!) 57  Resp: 16  Temp: 98.5 F (36.9 C)  SpO2: 100%  Weight: 165 lb 6.4 oz (75 kg)   Physical Exam Constitutional:      General: She is not in acute distress. Cardiovascular:     Rate and Rhythm: Normal rate and regular rhythm.     Heart sounds: Normal heart sounds.  Pulmonary:     Effort: Pulmonary effort is normal.     Breath sounds: Normal breath sounds.  Abdominal:     General: Bowel sounds are normal.     Palpations: Abdomen is soft.  Lymphadenopathy:     Comments: There is palpable left lower cervical/supraclavicular adenopathy  Skin:    General: Skin is warm and dry.  Neurological:     Mental Status: She is alert and oriented to person, place, and time.         Latest Ref Rng & Units 07/19/2022    9:15 AM  CMP  Glucose 70 - 99 mg/dL 105   BUN 8 - 23 mg/dL 12   Creatinine 0.44 - 1.00 mg/dL 0.65   Sodium 135 - 145 mmol/L 142   Potassium 3.5 - 5.1 mmol/L 3.4   Chloride 98 - 111 mmol/L 108   CO2 22 - 32 mmol/L 24   Calcium 8.9 - 10.3 mg/dL 9.4   Total Protein 6.5 - 8.1 g/dL 6.9   Total Bilirubin 0.3 - 1.2 mg/dL 0.7   Alkaline Phos 38 - 126 U/L 56   AST 15 - 41 U/L 16   ALT 0 - 44 U/L 11  Latest Ref Rng & Units  07/19/2022    9:15 AM  CBC  WBC 4.0 - 10.5 K/uL 3.1   Hemoglobin 12.0 - 15.0 g/dL 11.9   Hematocrit 36.0 - 46.0 % 35.4   Platelets 150 - 400 K/uL 212     No images are attached to the encounter.  MR Brain W Wo Contrast  Result Date: 07/12/2022 CLINICAL DATA:  Small cell lung cancer with brain metastases. Whole brain radiation therapy in 01/2022. EXAM: MRI HEAD WITHOUT AND WITH CONTRAST TECHNIQUE: Multiplanar, multiecho pulse sequences of the brain and surrounding structures were obtained without and with intravenous contrast. CONTRAST:  20mL GADAVIST GADOBUTROL 1 MMOL/ML IV SOLN COMPARISON:  Head MRI 04/20/2022 FINDINGS: Brain: Multiple small, previously treated metastases are again seen in the cerebrum and cerebellum, many of which demonstrate chronic blood products and intrinsic T1 hyperintensity. All lesions are stable to smaller than on the prior MRI, currently measuring up to 3 mm in size, and some previous lesions are no longer clearly visible. No edema or new lesions are identified. No acute infarct, midline shift, or extra-axial fluid collection is evident. The ventricles are normal in size. Vascular: Major intracranial vascular flow voids are preserved. Skull and upper cervical spine: Unremarkable bone marrow signal para Sinuses/Orbits: Bilateral cataract extraction. Clear paranasal sinuses. Increased, moderate bilateral mastoid effusions. Other: Subcentimeter cyst in the posterior nasopharynx to the right of midline. IMPRESSION: Stable to decreased size of treated brain metastases. No evidence of new intracranial metastases. Electronically Signed   By: Logan Bores M.D.   On: 07/12/2022 15:49   CT CHEST ABDOMEN PELVIS W CONTRAST  Result Date: 07/12/2022 CLINICAL DATA:  Metastatic small-cell lung cancer restaging, ongoing chemotherapy * Tracking Code: BO * EXAM: CT CHEST, ABDOMEN, AND PELVIS WITH CONTRAST TECHNIQUE: Multidetector CT imaging of the chest, abdomen and pelvis was performed  following the standard protocol during bolus administration of intravenous contrast. RADIATION DOSE REDUCTION: This exam was performed according to the departmental dose-optimization program which includes automated exposure control, adjustment of the mA and/or kV according to patient size and/or use of iterative reconstruction technique. CONTRAST:  150mL OMNIPAQUE IOHEXOL 300 MG/ML SOLN, additional oral enteric contrast COMPARISON:  04/20/2022 FINDINGS: CT CHEST FINDINGS Cardiovascular: Scattered aortic atherosclerosis. Normal heart size. No pericardial effusion. Mediastinum/Nodes: Unchanged post treatment appearance of soft tissue in the AP window (series 2, image 18). Continued interval enlargement of left lower cervical and supraclavicular lymph nodes, left supraclavicular nodes measuring up to 2.7 x 1.7 cm, previously 2.4 x 1.4 cm (series 2, image 5) No other discretely enlarged mediastinal, hilar, or axillary lymph nodes. Unchanged multinodular thyroid. In the setting of significant comorbidities or limited life expectancy, no follow-up recommended (ref: J Am Coll Radiol. 2015 Feb;12(2): 143-50). Trachea, and esophagus demonstrate no significant findings. Lungs/Pleura: Unchanged perihilar and suprahilar scarring and fibrosis of the left lung (series 4, image 41). Unchanged small bilateral subpleural and parenchymal pulmonary nodules, several of which appear to be faintly calcified, for example a fissural nodule of the anterior right lower lobe measuring 0.4 cm (series 4, image 66) and a subpleural nodule of the peripheral left lower lobe measuring 0.5 cm (series 4, image 69). No new nodules. No pleural effusion or pneumothorax. Musculoskeletal: No chest wall abnormality. No acute osseous findings. CT ABDOMEN PELVIS FINDINGS Hepatobiliary: Unchanged, hypodense treated lesion of the anterior liver adjacent to the gallbladder fossa, hepatic segment IVB, measuring 3.2 x 2.5 cm (series 2, image 45). Unchanged, more  ill-defined subcapsular hypodensity adjacent to  the falciform ligament, characteristic in location and appearance for hepatic steatosis, measuring approximately 1.9 x 1.5 cm (series 2, image 42). Unchanged enhancing lesion of the posterior right lobe of the liver, hepatic segment VII, measuring 1.8 x 1.4 cm, most likely a flash filling hemangioma (series 2, image 43). No gallstones, gallbladder wall thickening, or biliary dilatation. Pancreas: Unremarkable. No pancreatic ductal dilatation or surrounding inflammatory changes. Spleen: Normal in size without significant abnormality. Adrenals/Urinary Tract: Adrenal glands are unremarkable. Kidneys are normal, without renal calculi, solid lesion, or hydronephrosis. Bladder is unremarkable. Stomach/Bowel: Stomach is within normal limits. Appendix appears normal. No evidence of bowel wall thickening, distention, or inflammatory changes. Vascular/Lymphatic: Aortic atherosclerosis. Interval resolution of previously seen left renal vein thrombus (series 2, image 56) as well as left hepatic lobe portal venous thrombus (series 2, image 42). No enlarged abdominal or pelvic lymph nodes. Reproductive: Status post hysterectomy. Other: No abdominal wall hernia or abnormality. No ascites. Musculoskeletal: No acute osseous findings. IMPRESSION: 1. Continued interval enlargement of left lower cervical and supraclavicular lymph nodes, concerning for nodal metastatic disease. 2. Unchanged post treatment appearance of soft tissue in the AP window and no other discretely enlarged mediastinal, hilar, or axillary lymph nodes. 3. Unchanged, hypodense treated lesion of the anterior liver adjacent to the gallbladder fossa, hepatic segment IVB. 4. Unchanged small bilateral subpleural and parenchymal pulmonary nodules. No new nodules. 5. Interval resolution of previously seen left renal vein thrombus as well as left hepatic lobe portal venous thrombus. Aortic Atherosclerosis (ICD10-I70.0).  Electronically Signed   By: Delanna Ahmadi M.D.   On: 07/12/2022 13:31     Assessment and plan- Patient is a 71 y.o. female with extensive stage small cell lung cancer with brain metastases here for on treatment assessment prior to cycle 23 of Tecentriq  I have reviewed CT chest abdomen pelvis images independently and discussed findings with the patient and her daughter.  CT findings are concerning for disease progression in the lower cervical lymph nodes and supraclavicular nodes.  Mediastinal mass and liver mets stable.  MRI brain shows stable to decreased size of brain mets.  She does have palpable left supraclavicular adenopathy.  She has palpable adenopathy on today's exam as well.  I am therefore proceeding with a repeat biopsy to confirm disease progression as well as to repeat NGS testing on the specimen.  I will reach out to Dr. Durenda Hurt at [facility].  She was seen in the past if she will be a candidate for any clinical trial for second line treatment.  If she is not a candidate for clinical trial actionable mutations and NGS testing, I would recommend second line lurbinectedin.    I will see her back in 3 weeks for next treatment she will potentially be changed to lurbinectedin based on biopsy results Visit Diagnosis 1. Small cell carcinoma of lung metastatic to liver ([place])   2. Encounter for antineoplastic immunotherapy      Dr. Randa Evens, MD, MPH [facility] [phone] 07/19/2022 12:37 PM

## 2022-07-19 NOTE — Progress Notes (Signed)
ON PATHWAY REGIMEN - Small Cell Lung  No Change  Continue With Treatment as Ordered.  Original Decision Date/Time: 11/12/2020 12:52     Cycles 1 through 4, every 21 days:     Atezolizumab      Carboplatin      Etoposide    Cycles 5 and beyond, every 21 days:     Atezolizumab   **Always confirm dose/schedule in your pharmacy ordering system**  Patient Characteristics: Newly Diagnosed, Preoperative or Nonsurgical Candidate (Clinical Staging), First Line, Extensive Stage Therapeutic Status: Newly Diagnosed, Preoperative or Nonsurgical Candidate (Clinical Staging) AJCC T Category: cT4 AJCC N Category: cNX AJCC M Category: cM1 AJCC 8 Stage Grouping: IV Stage Classification: Extensive  Intent of Therapy: Non-Curative / Palliative Intent, Discussed with Patient

## 2022-07-19 NOTE — Patient Instructions (Signed)
[facility] CANCER CTR AT [place]  Discharge Instructions: Thank you for choosing [facility] to provide your oncology and hematology care.  If you have a lab appointment with the [facility], please go directly to the [facility] and check in at the registration area.  Wear comfortable clothing and clothing appropriate for easy access to any Portacath or PICC line.   We strive to give you quality time with your provider. You may need to reschedule your appointment if you arrive late (15 or more minutes).  Arriving late affects you and other patients whose appointments are after yours.  Also, if you miss three or more appointments without notifying the office, you may be dismissed from the clinic at the provider's discretion.      For prescription refill requests, have your pharmacy contact our office and allow 72 hours for refills to be completed.    Today you received the following chemotherapy and/or immunotherapy agents Tecentriq.      To help prevent nausea and vomiting after your treatment, we encourage you to take your nausea medication as directed.  BELOW ARE SYMPTOMS THAT SHOULD BE REPORTED IMMEDIATELY: *FEVER GREATER THAN 100.4 F (38 C) OR HIGHER *CHILLS OR SWEATING *NAUSEA AND VOMITING THAT IS NOT CONTROLLED WITH YOUR NAUSEA MEDICATION *UNUSUAL SHORTNESS OF BREATH *UNUSUAL BRUISING OR BLEEDING *URINARY PROBLEMS (pain or burning when urinating, or frequent urination) *BOWEL PROBLEMS (unusual diarrhea, constipation, pain near the anus) TENDERNESS IN MOUTH AND THROAT WITH OR WITHOUT PRESENCE OF ULCERS (sore throat, sores in mouth, or a toothache) UNUSUAL RASH, SWELLING OR PAIN  UNUSUAL VAGINAL DISCHARGE OR ITCHING   Items with * indicate a potential emergency and should be followed up as soon as possible or go to the Emergency Department if any problems should occur.  Please show the CHEMOTHERAPY ALERT CARD or IMMUNOTHERAPY ALERT CARD at check-in to  the Emergency Department and triage nurse.  Should you have questions after your visit or need to cancel or reschedule your appointment, please contact [facility] [place] AT [place]  [phone] and follow the prompts.  Office hours are 8:00 a.m. to 4:30 p.m. Monday - Friday. Please note that voicemails left after 4:00 p.m. may not be returned until the following business day.  We are closed weekends and major holidays. You have access to a nurse at all times for urgent questions. Please call the main number to the clinic [phone] and follow the prompts.  For any non-urgent questions, you may also contact your provider using MyChart. We now offer e-Visits for anyone 31 and older to request care online for non-urgent symptoms. For details visit [email or web address].   Also download the MyChart app! Go to the app store, search "MyChart", open the app, select [place], and log in with your MyChart username and password.  Masks are optional in the cancer centers. If you would like for your care team to wear a mask while they are taking care of you, please let them know. For doctor visits, patients may have with them one support person who is at least 72 years old. At this time, visitors are not allowed in the infusion area.

## 2022-07-19 NOTE — Progress Notes (Signed)
Pt states her memory seems to be declining not able to remember much. Also, daughter states her hearing is declining as well.

## 2022-07-20 ENCOUNTER — Telehealth: Payer: Self-pay | Admitting: *Deleted

## 2022-07-20 NOTE — Telephone Encounter (Signed)
Called and the pt answered and someone called her yest. And let her know of hte appt and arrival at medical mall at 1:30. She is agreeable for that

## 2022-07-20 NOTE — Progress Notes (Signed)
Patient on schedule for Left Suprclavicular LN biopsy 9/14, called and spoke with patient on phone with pre procedure instructions given. Made aware to be here at 1330, since no sedation and holding Eliquis day of procedure only. Stated understanding.

## 2022-07-25 ENCOUNTER — Ambulatory Visit
Admission: RE | Admit: 2022-07-25 | Discharge: 2022-07-25 | Disposition: A | Discharge status: Home / Self Care | Payer: Medicare HMO | Source: Ambulatory Visit | Attending: Radiation Oncology | Admitting: Radiation Oncology

## 2022-07-25 ENCOUNTER — Encounter: Payer: Self-pay | Admitting: Radiation Oncology

## 2022-07-25 VITALS — BP 150/79 | HR 66 | Temp 99.0°F | Resp 20 | Ht 66.0 in | Wt 151.8 lb

## 2022-07-25 DIAGNOSIS — C771 Secondary and unspecified malignant neoplasm of intrathoracic lymph nodes: Secondary | ICD-10-CM | POA: Insufficient documentation

## 2022-07-25 DIAGNOSIS — C7931 Secondary malignant neoplasm of brain: Secondary | ICD-10-CM | POA: Diagnosis not present

## 2022-07-25 DIAGNOSIS — C3490 Malignant neoplasm of unspecified part of unspecified bronchus or lung: Secondary | ICD-10-CM | POA: Diagnosis not present

## 2022-07-25 DIAGNOSIS — Z923 Personal history of irradiation: Secondary | ICD-10-CM | POA: Insufficient documentation

## 2022-07-25 DIAGNOSIS — C787 Secondary malignant neoplasm of liver and intrahepatic bile duct: Secondary | ICD-10-CM | POA: Diagnosis not present

## 2022-07-25 NOTE — Progress Notes (Signed)
Radiation Oncology Follow up Note  Name: Doris Johnson   Date:   07/25/2022 MRN:  [id number] DOB: July 18, 1951    This 71 y.o. female presents to the clinic today for 51-month follow-up status post whole brain radiation therapy for extensive stage small cell lung cancer with brain involvement.  REFERRING PROVIDER: Leonel Ramsay, MD  HPI: Patient is a 71 year old female now out 18 months having completed whole brain radiation therapy for involvement of extensive stage small cell lung cancer.  Seen today in routine follow-up she is doing fairly well.  She specifically denies cough hemoptysis chest tightness or any change in her neurologic status..  She is currently on maintenance Tecentriq which she is tolerating well.  Brain MRI last month showed stable to decrease size of treated brain metastasis no evidence of any new intracranial lesions.  Her most recent CT scan shows continued interval enlargement of left lower cervical and supraclavicular lymph nodes concerning for nodal metastatic disease.  She is scheduled for a biopsy of her supraclavicular nodes in the next week.  She specifically [place] cough hemoptysis or chest tightness.  COMPLICATIONS OF TREATMENT: none  FOLLOW UP COMPLIANCE: keeps appointments   PHYSICAL EXAM:  BP (!) 150/79   Pulse 66   Temp 99 F (37.2 C) (Tympanic)   Resp 20   Ht 5\' 6"  (1.676 m) Comment: stated HT  Wt 151 lb 12.8 oz (68.9 kg)   BMI 24.50 kg/m  Well-developed well-nourished patient in NAD. HEENT reveals PERLA, EOMI, discs not visualized.  Oral cavity is clear. No oral mucosal lesions are identified. Neck is clear without evidence of cervical or supraclavicular adenopathy. Lungs are clear to A&P. Cardiac examination is essentially unremarkable with regular rate and rhythm without murmur rub or thrill. Abdomen is benign with no organomegaly or masses noted. Motor sensory and DTR levels are equal and symmetric in the upper and lower extremities. Cranial  nerves II through XII are grossly intact. Proprioception is intact. No peripheral adenopathy or edema is identified. No motor or sensory levels are noted. Crude visual fields are within normal range.  RADIOLOGY RESULTS: CT scans MRI scans reviewed compatible with above-stated findings  PLAN: At this time patient remains on maintenance Tecentriq.  I see no role for any further palliative treatment at this time.  Be happy to reevaluate her for any palliative treatment should that occur.  I have asked to see her back in 6 months for follow-up.  Patient continues close follow-up care and treatment by medical oncology.  Patient is to call with any concerns.  I would like to take this opportunity to thank you for allowing me to participate in the care of your patient.Noreene Filbert, MD

## 2022-07-28 ENCOUNTER — Ambulatory Visit
Admission: RE | Admit: 2022-07-28 | Discharge: 2022-07-28 | Disposition: A | Discharge status: Home / Self Care | Payer: Medicare HMO | Source: Ambulatory Visit | Attending: Oncology | Admitting: Oncology

## 2022-07-28 ENCOUNTER — Telehealth: Payer: Self-pay | Admitting: *Deleted

## 2022-07-28 DIAGNOSIS — R590 Localized enlarged lymph nodes: Secondary | ICD-10-CM | POA: Insufficient documentation

## 2022-07-28 DIAGNOSIS — C770 Secondary and unspecified malignant neoplasm of lymph nodes of head, face and neck: Secondary | ICD-10-CM | POA: Diagnosis not present

## 2022-07-28 DIAGNOSIS — C787 Secondary malignant neoplasm of liver and intrahepatic bile duct: Secondary | ICD-10-CM | POA: Diagnosis not present

## 2022-07-28 DIAGNOSIS — C3490 Malignant neoplasm of unspecified part of unspecified bronchus or lung: Secondary | ICD-10-CM | POA: Diagnosis not present

## 2022-07-28 DIAGNOSIS — C7A1 Malignant poorly differentiated neuroendocrine tumors: Secondary | ICD-10-CM | POA: Diagnosis not present

## 2022-07-28 NOTE — Procedures (Signed)
Interventional Radiology Procedure:   Indications: Small cell carcinoma with persistent left supraclavicular lymphadenopathy  Procedure: US guided biopsy of left supraclavicular lymph nodes  Findings: Cores from superior left supraclavicular lymph node yielded scant material.  Cores from inferior left supraclavicular lymph node yielded adequate core biopsies.  Complications: No immediate complications noted.     EBL: Minimal  Plan: Discharge to home.    Doris Johnson R. Anselm Pancoast, MD  Pager: [phone]

## 2022-07-28 NOTE — Telephone Encounter (Signed)
I called pt back and let her know that she does not need prep for the bx. [facility] and have a driver to take her. She will register with admissions and then they will take her to where she gets bx. She has not ate anything this am and a I told her to not eat or drink. She will get set up and make sure all info in computer is up to date, then they will get IV and do the bx and then pt will be there about 2-4 hours. Pt understands.

## 2022-07-29 ENCOUNTER — Other Ambulatory Visit: Payer: Self-pay | Admitting: Oncology

## 2022-07-29 DIAGNOSIS — C3490 Malignant neoplasm of unspecified part of unspecified bronchus or lung: Secondary | ICD-10-CM

## 2022-07-29 LAB — SURGICAL PATHOLOGY

## 2022-07-29 NOTE — Progress Notes (Signed)
DISCONTINUE ON PATHWAY REGIMEN - Small Cell Lung     Cycles 1 through 4, every 21 days:     Atezolizumab      Carboplatin      Etoposide    Cycles 5 and beyond, every 21 days:     Atezolizumab   **Always confirm dose/schedule in your pharmacy ordering system**  REASON: Disease Progression PRIOR TREATMENT: KPV374: Atezolizumab 1,200 mg D1 + Carboplatin AUC=5 D1 + Etoposide 100 mg/m2 D1-3 q21 Days x 4 Cycles, Followed by Atezolizumab 1,200 mg q21 Days Until Progression or Unacceptable Toxicity TREATMENT RESPONSE: Progressive Disease (PD)  START ON PATHWAY REGIMEN - Small Cell Lung     A cycle is every 21 days:     Lurbinectedin   **Always confirm dose/schedule in your pharmacy ordering system**  Patient Characteristics: Relapsed or Progressive Disease, Second Line, Relapse ? 6 Months Therapeutic Status: Relapsed or Progressive Disease Line of Therapy: Second Line Time to Relapse: Relapse ? 6 Months Intent of Therapy: Non-Curative / Palliative Intent, Discussed with Patient

## 2022-08-08 MED FILL — Dexamethasone Sodium Phosphate Inj 100 MG/10ML: INTRAMUSCULAR | Qty: 1 | Status: AC

## 2022-08-09 ENCOUNTER — Inpatient Hospital Stay: Payer: Medicare HMO

## 2022-08-09 ENCOUNTER — Ambulatory Visit: Payer: Medicare HMO

## 2022-08-09 ENCOUNTER — Inpatient Hospital Stay (HOSPITAL_BASED_OUTPATIENT_CLINIC_OR_DEPARTMENT_OTHER): Payer: Medicare HMO | Admitting: Oncology

## 2022-08-09 ENCOUNTER — Encounter: Payer: Self-pay | Admitting: Oncology

## 2022-08-09 VITALS — BP 140/68 | HR 58 | Temp 98.3°F | Resp 16 | Ht 66.0 in | Wt 165.5 lb

## 2022-08-09 DIAGNOSIS — Z7189 Other specified counseling: Secondary | ICD-10-CM | POA: Diagnosis not present

## 2022-08-09 DIAGNOSIS — C787 Secondary malignant neoplasm of liver and intrahepatic bile duct: Secondary | ICD-10-CM

## 2022-08-09 DIAGNOSIS — C7931 Secondary malignant neoplasm of brain: Secondary | ICD-10-CM | POA: Diagnosis not present

## 2022-08-09 DIAGNOSIS — Z5111 Encounter for antineoplastic chemotherapy: Secondary | ICD-10-CM | POA: Diagnosis not present

## 2022-08-09 DIAGNOSIS — E049 Nontoxic goiter, unspecified: Secondary | ICD-10-CM | POA: Diagnosis not present

## 2022-08-09 DIAGNOSIS — E119 Type 2 diabetes mellitus without complications: Secondary | ICD-10-CM | POA: Diagnosis not present

## 2022-08-09 DIAGNOSIS — C3490 Malignant neoplasm of unspecified part of unspecified bronchus or lung: Secondary | ICD-10-CM | POA: Diagnosis not present

## 2022-08-09 DIAGNOSIS — I10 Essential (primary) hypertension: Secondary | ICD-10-CM | POA: Diagnosis not present

## 2022-08-09 DIAGNOSIS — E876 Hypokalemia: Secondary | ICD-10-CM | POA: Diagnosis not present

## 2022-08-09 DIAGNOSIS — D72819 Decreased white blood cell count, unspecified: Secondary | ICD-10-CM | POA: Diagnosis not present

## 2022-08-09 DIAGNOSIS — C3412 Malignant neoplasm of upper lobe, left bronchus or lung: Secondary | ICD-10-CM | POA: Diagnosis not present

## 2022-08-09 DIAGNOSIS — Z5189 Encounter for other specified aftercare: Secondary | ICD-10-CM | POA: Diagnosis not present

## 2022-08-09 LAB — COMPREHENSIVE METABOLIC PANEL
ALT: 10 U/L (ref 0–44)
AST: 16 U/L (ref 15–41)
Albumin: 3.8 g/dL (ref 3.5–5.0)
Alkaline Phosphatase: 71 U/L (ref 38–126)
Anion gap: 4 — ABNORMAL LOW (ref 5–15)
BUN: 12 mg/dL (ref 8–23)
CO2: 25 mmol/L (ref 22–32)
Calcium: 9.6 mg/dL (ref 8.9–10.3)
Chloride: 111 mmol/L (ref 98–111)
Creatinine, Ser: 0.54 mg/dL (ref 0.44–1.00)
GFR, Estimated: >60 mL/min (ref >60)
Glucose, Bld: 104 mg/dL — ABNORMAL HIGH (ref 70–99)
Potassium: 3.4 mmol/L — ABNORMAL LOW (ref 3.5–5.1)
Sodium: 140 mmol/L (ref 135–145)
Total Bilirubin: 0.8 mg/dL (ref 0.3–1.2)
Total Protein: 7 g/dL (ref 6.5–8.1)

## 2022-08-09 LAB — CBC WITH DIFFERENTIAL/PLATELET
Abs Immature Granulocytes: 0 10*3/uL (ref 0.00–0.07)
Basophils Absolute: 0 10*3/uL (ref 0.0–0.1)
Basophils Relative: 0 %
Eosinophils Absolute: 0.1 10*3/uL (ref 0.0–0.5)
Eosinophils Relative: 3 %
HCT: 35.7 % — ABNORMAL LOW (ref 36.0–46.0)
Hemoglobin: 12 g/dL (ref 12.0–15.0)
Immature Granulocytes: 0 %
Lymphocytes Relative: 22 %
Lymphs Abs: 0.7 10*3/uL (ref 0.7–4.0)
MCH: 29.9 pg (ref 26.0–34.0)
MCHC: 33.6 g/dL (ref 30.0–36.0)
MCV: 88.8 fL (ref 80.0–100.0)
Monocytes Absolute: 0.3 10*3/uL (ref 0.1–1.0)
Monocytes Relative: 11 %
Neutro Abs: 2 10*3/uL (ref 1.7–7.7)
Neutrophils Relative %: 64 %
Platelets: 225 10*3/uL (ref 150–400)
RBC: 4.02 MIL/uL (ref 3.87–5.11)
RDW: 13.4 % (ref 11.5–15.5)
WBC: 3.2 10*3/uL — ABNORMAL LOW (ref 4.0–10.5)
nRBC: 0 % (ref 0.0–0.2)

## 2022-08-09 LAB — CK: Total CK: 77 U/L (ref 38–234)

## 2022-08-09 MED ORDER — SODIUM CHLORIDE 0.9 % IV SOLN
Freq: Once | INTRAVENOUS | Status: AC
  Filled 2022-08-09: qty 250

## 2022-08-09 MED ORDER — SODIUM CHLORIDE 0.9 % IV SOLN
3.2000 mg/m2 | Freq: Once | INTRAVENOUS | Status: AC
  Administered 2022-08-09: 5.75 mg via INTRAVENOUS
  Filled 2022-08-09: qty 11.5

## 2022-08-09 MED ORDER — PALONOSETRON HCL INJECTION 0.25 MG/5ML
0.2500 mg | Freq: Once | INTRAVENOUS | Status: AC
  Administered 2022-08-09: 0.25 mg via INTRAVENOUS
  Filled 2022-08-09: qty 5

## 2022-08-09 MED ORDER — SODIUM CHLORIDE 0.9% FLUSH
10.0000 mL | INTRAVENOUS | Status: DC | PRN
  Administered 2022-08-09: 10 mL via INTRAVENOUS
  Filled 2022-08-09: qty 10

## 2022-08-09 MED ORDER — HEPARIN SOD (PORK) LOCK FLUSH 100 UNIT/ML IV SOLN
500.0000 [IU] | Freq: Once | INTRAVENOUS | Status: AC | PRN
  Administered 2022-08-09: 500 [IU]
  Filled 2022-08-09: qty 5

## 2022-08-09 MED ORDER — SODIUM CHLORIDE 0.9 % IV SOLN
10.0000 mg | Freq: Once | INTRAVENOUS | Status: AC
  Administered 2022-08-09: 10 mg via INTRAVENOUS
  Filled 2022-08-09: qty 10

## 2022-08-09 NOTE — Patient Instructions (Signed)
[facility] AT [place]  Discharge Instructions: Thank you for choosing [facility] to provide your oncology and hematology care.  If you have a lab appointment with the [facility], please go directly to the [facility] and check in at the registration area.  Wear comfortable clothing and clothing appropriate for easy access to any Portacath or PICC line.   We strive to give you quality time with your provider. You may need to reschedule your appointment if you arrive late (15 or more minutes).  Arriving late affects you and other patients whose appointments are after yours.  Also, if you miss three or more appointments without notifying the office, you may be dismissed from the clinic at the provider's discretion.      For prescription refill requests, have your pharmacy contact our office and allow 72 hours for refills to be completed.    Today you received the following chemotherapy and/or immunotherapy agents Lurbinectedin.      To help prevent nausea and vomiting after your treatment, we encourage you to take your nausea medication as directed.  BELOW ARE SYMPTOMS THAT SHOULD BE REPORTED IMMEDIATELY: *FEVER GREATER THAN 100.4 F (38 C) OR HIGHER *CHILLS OR SWEATING *NAUSEA AND VOMITING THAT IS NOT CONTROLLED WITH YOUR NAUSEA MEDICATION *UNUSUAL SHORTNESS OF BREATH *UNUSUAL BRUISING OR BLEEDING *URINARY PROBLEMS (pain or burning when urinating, or frequent urination) *BOWEL PROBLEMS (unusual diarrhea, constipation, pain near the anus) TENDERNESS IN MOUTH AND THROAT WITH OR WITHOUT PRESENCE OF ULCERS (sore throat, sores in mouth, or a toothache) UNUSUAL RASH, SWELLING OR PAIN  UNUSUAL VAGINAL DISCHARGE OR ITCHING   Items with * indicate a potential emergency and should be followed up as soon as possible or go to the Emergency Department if any problems should occur.  Please show the CHEMOTHERAPY ALERT CARD or IMMUNOTHERAPY ALERT CARD at check-in  to the Emergency Department and triage nurse.  Should you have questions after your visit or need to cancel or reschedule your appointment, please contact [facility] CANCER [place] AT [place]  [phone] and follow the prompts.  Office hours are 8:00 a.m. to 4:30 p.m. Monday - Friday. Please note that voicemails left after 4:00 p.m. may not be returned until the following business day.  We are closed weekends and major holidays. You have access to a nurse at all times for urgent questions. Please call the main number to the clinic [phone] and follow the prompts.  For any non-urgent questions, you may also contact your provider using MyChart. We now offer e-Visits for anyone 72 and older to request care online for non-urgent symptoms. For details visit [email or web address].   Also download the MyChart app! Go to the app store, search "MyChart", open the app, select [facility], and log in with your MyChart username and password.  Masks are optional in the cancer centers. If you would like for your care team to wear a mask while they are taking care of you, please let them know. For doctor visits, patients may have with them one support person who is at least 72 years old. At this time, visitors are not allowed in the infusion area.

## 2022-08-09 NOTE — Progress Notes (Signed)
Hematology/Oncology Consult note [facility]  Telephone:([phone] [phone]  Patient Care Team: Leonel Ramsay, MD as PCP - General (Infectious Diseases) Telford Nab, RN as Oncology Nurse Navigator Sindy Guadeloupe, MD as Consulting Physician (Hematology and Oncology)   Name of the patient: Doris Johnson  [id number]  1951-02-26   Date of visit: 08/09/22  Diagnosis- extensive stage small cell lung cancer with liver and brain metastases    Chief complaint/ Reason for visit-on treatment assessment prior to cycle 1 of lurbinectedin  Heme/Onc history: Patient is a 71 year old female with a remote history of smoking in her teenage years and exposure to passive smoking.  She has been having ongoing midsternal pain which has been growing since the last 6 to 7 months.  She had been to urgent care as well in the past.  She finally had a CT chest with contrast done in October 2021 which showed a large mass in the left side of the mediastinum measuring 6.7 x 6.2 x 6.1 cm causing severe compression of the left main pulmonary artery and around the aortic arch in the left subclavian artery.  Enlarged thyroid gland.  Left upper lobe nodule 1 x 0.7 cm.  She then underwent bronchoscopy with Dr.Aleskerov left upper lobe biopsy was nondiagnostic.  However station 4, 7 and 10 L lymph nodes were consistent with metastatic small cell carcinoma.   PET CT scan showed enlarged level 3 cervical lymph node 2.2 cm that was hypermetabolic with an SUV of 9.7.  Large central 7.3 cm AP window mass.  5.7 cm left liver mass.  MRI brain with and without contrast showed 2 small foci of enhancement in the right cerebellar hemisphere and right temporal lobe without mass-effect or edema as well concerning for brain metastases   Patient had 2 cycles of carbo etoposide chemotherapy along with Tecentriq and subsequent scan showed no significant improvement in the primary lung mass or liver  mass.  She received radiation treatment to her primary lung mass and also seen by [facility] for second opinion.  Her pathology was reviewed at [facility] and reported as possible neuroendocrine neoplasm But stated that small cell carcinoma cannot be excluded but not definitely diagnostic for it.  However [facility] pathology feels that this is consistent with small cell lung cancer.  She has completed 4 cycles of carbo etoposide Tecentriq chemotherapy and is currently on maintenance Tecentriq.  Repeat liver biopsy was also performed and was consistent with small cell lung cancer   Disease progression after 23 cycles of maintenance Tecentriq in the left supraclavicular lymph node region.  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  Plan to switch patient to second line lurbinectedin  Interval history-patient is here with her daughter today.  She reports doing well overall.  Denies any specific complaints at this time.  She has some mild baseline fatigue but denies other complaints  ECOG PS- 1 Pain scale- 0 Opioid associated constipation- no  Review of systems- Review of Systems  Constitutional:  Negative for chills, fever, malaise/fatigue and weight loss.  HENT:  Negative for congestion, ear discharge and nosebleeds.   Eyes:  Negative for blurred vision.  Respiratory:  Negative for cough, hemoptysis, sputum production, shortness of breath and wheezing.   Cardiovascular:  Negative for chest pain, palpitations, orthopnea and claudication.  Gastrointestinal:  Negative for abdominal pain, blood in stool, constipation, diarrhea, heartburn, melena, nausea and vomiting.  Genitourinary:  Negative for dysuria, flank pain, frequency, hematuria  and urgency.  Musculoskeletal:  Negative for back pain, joint pain and myalgias.  Skin:  Negative for rash.  Neurological:  Negative for dizziness, tingling, focal weakness, seizures, weakness and headaches.  Endo/Heme/Allergies:  Does not  bruise/bleed easily.  Psychiatric/Behavioral:  Negative for depression and suicidal ideas. The patient does not have insomnia.       No Known Allergies   Past Medical History:  Diagnosis Date   Cancer ([place])    Cataract    Diabetes mellitus without complication ([place])    Hyperlipidemia    Hypertension    Hypothyroidism    doctor's keeping an eye on thyroid    Lung cancer [facility])      Past Surgical History:  Procedure Laterality Date   ABDOMINAL HYSTERECTOMY     IR CV LINE INJECTION  09/28/2021   PORTA CATH INSERTION N/A 11/30/2020   Procedure: PORTA CATH INSERTION;  Surgeon: Algernon Huxley, MD;  Location: [place] CV LAB;  Service: Cardiovascular;  Laterality: N/A;   VIDEO BRONCHOSCOPY WITH ENDOBRONCHIAL NAVIGATION N/A 10/21/2020   Procedure: VIDEO BRONCHOSCOPY WITH ENDOBRONCHIAL NAVIGATION;  Surgeon: Ottie Glazier, MD;  Location: [facility] ORS;  Service: Thoracic;  Laterality: N/A;   VIDEO BRONCHOSCOPY WITH ENDOBRONCHIAL ULTRASOUND N/A 10/21/2020   Procedure: VIDEO BRONCHOSCOPY WITH ENDOBRONCHIAL ULTRASOUND;  Surgeon: Ottie Glazier, MD;  Location: [facility] ORS;  Service: Thoracic;  Laterality: N/A;    Social History   Socioeconomic History   Marital status: Single    Spouse name: Not on file   Number of children: Not on file   Years of education: Not on file   Highest education level: Not on file  Occupational History   Not on file  Tobacco Use   Smoking status: Never   Smokeless tobacco: Never  Vaping Use   Vaping Use: Never used  Substance and Sexual Activity   Alcohol use: No   Drug use: No   Sexual activity: Not Currently  Other Topics Concern   Not on file  Social History Narrative   Not on file   Social Determinants of Health   Financial Resource Strain: Not on file  Food Insecurity: Food Insecurity Present (07/08/2021)   Hunger Vital Sign    Worried About Running Out of Food in the Last Year: Sometimes true    Ran Out of Food in the Last Year: Sometimes  true  Transportation Needs: No Transportation Needs (04/05/2022)   PRAPARE - Hydrologist (Medical): No    Lack of Transportation (Non-Medical): No  Physical Activity: Not on file  Stress: Not on file  Social Connections: Not on file  Intimate Partner Violence: Not on file    Family History  Problem Relation Age of Onset   Cancer Mother        breast   Hypertension Mother    Breast cancer Mother 68   Heart disease Father    Hyperlipidemia Brother    Heart disease Brother      Current Outpatient Medications:    apixaban (ELIQUIS) 5 MG TABS tablet, Take 1 tablet (5 mg total) by mouth 2 (two) times daily., Disp: 60 tablet, Rfl: 1   dexamethasone (DECADRON) 4 MG tablet, Take 1 tablet (4 mg total) by mouth 2 (two) times daily with a meal., Disp: 60 tablet, Rfl: 1   fluticasone (FLONASE) 50 MCG/ACT nasal spray, Place 2 sprays into both nostrils daily., Disp: 16 g, Rfl: 5   furosemide (LASIX) 20 MG tablet, Take 1 tablet (20  mg total) by mouth daily., Disp: 60 tablet, Rfl: 0   lansoprazole (PREVACID) 30 MG capsule, Take 1 capsule (30 mg total) by mouth daily., Disp: 30 capsule, Rfl: 1   lidocaine-prilocaine (EMLA) cream, Apply 1 application. topically as needed. Apply small amount to port site at least 1 hour prior to it being accessed, cover with plastic wrap, Disp: 30 g, Rfl: 1   losartan (COZAAR) 25 MG tablet, Take 25 mg by mouth daily., Disp: , Rfl:    metFORMIN (GLUCOPHAGE-XR) 500 MG 24 hr tablet, Take 500 mg by mouth daily with breakfast., Disp: , Rfl:    mirtazapine (REMERON) 15 MG tablet, Take 15 mg by mouth at bedtime., Disp: , Rfl:    oxycodone (ROXICODONE) 30 MG immediate release tablet, Take 1 tablet (30 mg total) by mouth every 4 (four) hours as needed for pain., Disp: 180 tablet, Rfl: 0   potassium chloride SA (KLOR-CON M) 20 MEQ tablet, Take 2 tablets (40 mEq total) by mouth daily., Disp: 7 tablet, Rfl: 0   naloxone (NARCAN) nasal spray 4 mg/0.1  mL, , Disp: , Rfl:    polyethylene glycol (MIRALAX / GLYCOLAX) 17 g packet, Take 17 g by mouth daily. (Patient not taking: Reported on 07/19/2022), Disp: , Rfl:    traZODone (DESYREL) 50 MG tablet, Take by mouth. (Patient not taking: Reported on 07/19/2022), Disp: , Rfl:  No current facility-administered medications for this visit.  Facility-Administered Medications Ordered in Other Visits:    sodium chloride flush (NS) 0.9 % injection 10 mL, 10 mL, Intravenous, PRN, Sindy Guadeloupe, MD, 10 mL at 12/15/20 0850   sodium chloride flush (NS) 0.9 % injection 10 mL, 10 mL, Intravenous, PRN, Sindy Guadeloupe, MD, 10 mL at 08/09/22 0843  Physical exam:  Vitals:   08/09/22 0912  BP: (!) 140/68  Pulse: (!) 58  Resp: 16  Temp: 98.3 F (36.8 C)  TempSrc: Oral  Weight: 165 lb 8 oz (75.1 kg)  Height: 5\' 6"  (1.676 m)   Physical Exam Constitutional:      General: She is not in acute distress. Cardiovascular:     Rate and Rhythm: Normal rate and regular rhythm.     Heart sounds: Normal heart sounds.  Pulmonary:     Effort: Pulmonary effort is normal.     Breath sounds: Normal breath sounds.  Abdominal:     General: Bowel sounds are normal.     Palpations: Abdomen is soft.  Skin:    General: Skin is warm and dry.  Neurological:     Mental Status: She is alert and oriented to person, place, and time.         Latest Ref Rng & Units 08/09/2022    8:43 AM  CMP  Glucose 70 - 99 mg/dL 104   BUN 8 - 23 mg/dL 12   Creatinine 0.44 - 1.00 mg/dL 0.54   Sodium 135 - 145 mmol/L 140   Potassium 3.5 - 5.1 mmol/L 3.4   Chloride 98 - 111 mmol/L 111   CO2 22 - 32 mmol/L 25   Calcium 8.9 - 10.3 mg/dL 9.6   Total Protein 6.5 - 8.1 g/dL 7.0   Total Bilirubin 0.3 - 1.2 mg/dL 0.8   Alkaline Phos 38 - 126 U/L 71   AST 15 - 41 U/L 16   ALT 0 - 44 U/L 10       Latest Ref Rng & Units 08/09/2022    8:43 AM  CBC  WBC 4.0 -  10.5 K/uL 3.2   Hemoglobin 12.0 - 15.0 g/dL 12.0   Hematocrit 36.0 - 46.0 % 35.7    Platelets 150 - 400 K/uL 225     No images are attached to the encounter.  [place] CORE BIOPSY (LYMPH NODES)  Result Date: 07/28/2022 INDICATION: 71 year old with small cell lung cancer and enlarged left supraclavicular lymph nodes. Request for lymph node biopsy. EXAM: ULTRASOUND-GUIDED BIOPSY OF LEFT SUPRACLAVICULAR LYMPH NODES MEDICATIONS: 1% lidocaine for local anesthetic ANESTHESIA/SEDATION: None FLUOROSCOPY TIME:  None COMPLICATIONS: None immediate. PROCEDURE: Informed written consent was obtained from the patient after a thorough discussion of the procedural risks, benefits and alternatives. All questions were addressed.A timeout was performed prior to the initiation of the procedure. Left supraclavicular area was evaluated with ultrasound. Two enlarged lymph nodes were identified. The left side of the neck was prepped with chlorhexidine and sterile field was created. Skin was anesthetized using 1% lidocaine. A small incision was made. Using ultrasound guidance, 17 gauge coaxial needle was directed into the superior or more cranial lymph node. Three 18 gauge core biopsies were obtained from this lymph node but scant material was obtained. Therefore, the 17 gauge coaxial needle was directed into the more caudal or inferior lymph node and 5 core biopsies were obtained from this lymph node. Adequate core specimens were obtained. Specimens were placed on a Telfa pad with saline. Needle was removed without complication. Bandage placed over the puncture site. FINDINGS: Two enlarged lymph nodes in the left supraclavicular region. Core biopsy needle confirmed within the lymph nodes. IMPRESSION: Ultrasound-guided core biopsy of 2 left supraclavicular lymph nodes. Adequate specimens were obtained from the more caudal or inferior lymph node as described. Electronically Signed   By: Markus Daft M.D.   On: 07/28/2022 17:48   MR Brain W Wo Contrast  Result Date: 07/12/2022 CLINICAL DATA:  Small cell lung cancer with  brain metastases. Whole brain radiation therapy in 01/2022. EXAM: MRI HEAD WITHOUT AND WITH CONTRAST TECHNIQUE: Multiplanar, multiecho pulse sequences of the brain and surrounding structures were obtained without and with intravenous contrast. CONTRAST:  63mL GADAVIST GADOBUTROL 1 MMOL/ML IV SOLN COMPARISON:  Head MRI 04/20/2022 FINDINGS: Brain: Multiple small, previously treated metastases are again seen in the cerebrum and cerebellum, many of which demonstrate chronic blood products and intrinsic T1 hyperintensity. All lesions are stable to smaller than on the prior MRI, currently measuring up to 3 mm in size, and some previous lesions are no longer clearly visible. No edema or new lesions are identified. No acute infarct, midline shift, or extra-axial fluid collection is evident. The ventricles are normal in size. Vascular: Major intracranial vascular flow voids are preserved. Skull and upper cervical spine: Unremarkable bone marrow signal para Sinuses/Orbits: Bilateral cataract extraction. Clear paranasal sinuses. Increased, moderate bilateral mastoid effusions. Other: Subcentimeter cyst in the posterior nasopharynx to the right of midline. IMPRESSION: Stable to decreased size of treated brain metastases. No evidence of new intracranial metastases. Electronically Signed   By: Logan Bores M.D.   On: 07/12/2022 15:49   CT CHEST ABDOMEN PELVIS W CONTRAST  Result Date: 07/12/2022 CLINICAL DATA:  Metastatic small-cell lung cancer restaging, ongoing chemotherapy * Tracking Code: BO * EXAM: CT CHEST, ABDOMEN, AND PELVIS WITH CONTRAST TECHNIQUE: Multidetector CT imaging of the chest, abdomen and pelvis was performed following the standard protocol during bolus administration of intravenous contrast. RADIATION DOSE REDUCTION: This exam was performed according to the departmental dose-optimization program which includes automated exposure control, adjustment of the mA and/or kV  according to patient size and/or use of  iterative reconstruction technique. CONTRAST:  185mL OMNIPAQUE IOHEXOL 300 MG/ML SOLN, additional oral enteric contrast COMPARISON:  04/20/2022 FINDINGS: CT CHEST FINDINGS Cardiovascular: Scattered aortic atherosclerosis. Normal heart size. No pericardial effusion. Mediastinum/Nodes: Unchanged post treatment appearance of soft tissue in the AP window (series 2, image 18). Continued interval enlargement of left lower cervical and supraclavicular lymph nodes, left supraclavicular nodes measuring up to 2.7 x 1.7 cm, previously 2.4 x 1.4 cm (series 2, image 5) No other discretely enlarged mediastinal, hilar, or axillary lymph nodes. Unchanged multinodular thyroid. In the setting of significant comorbidities or limited life expectancy, no follow-up recommended (ref: J Am Coll Radiol. 2015 Feb;12(2): 143-50). Trachea, and esophagus demonstrate no significant findings. Lungs/Pleura: Unchanged perihilar and suprahilar scarring and fibrosis of the left lung (series 4, image 41). Unchanged small bilateral subpleural and parenchymal pulmonary nodules, several of which appear to be faintly calcified, for example a fissural nodule of the anterior right lower lobe measuring 0.4 cm (series 4, image 66) and a subpleural nodule of the peripheral left lower lobe measuring 0.5 cm (series 4, image 69). No new nodules. No pleural effusion or pneumothorax. Musculoskeletal: No chest wall abnormality. No acute osseous findings. CT ABDOMEN PELVIS FINDINGS Hepatobiliary: Unchanged, hypodense treated lesion of the anterior liver adjacent to the gallbladder fossa, hepatic segment IVB, measuring 3.2 x 2.5 cm (series 2, image 45). Unchanged, more ill-defined subcapsular hypodensity adjacent to the falciform ligament, characteristic in location and appearance for hepatic steatosis, measuring approximately 1.9 x 1.5 cm (series 2, image 42). Unchanged enhancing lesion of the posterior right lobe of the liver, hepatic segment VII, measuring 1.8 x  1.4 cm, most likely a flash filling hemangioma (series 2, image 43). No gallstones, gallbladder wall thickening, or biliary dilatation. Pancreas: Unremarkable. No pancreatic ductal dilatation or surrounding inflammatory changes. Spleen: Normal in size without significant abnormality. Adrenals/Urinary Tract: Adrenal glands are unremarkable. Kidneys are normal, without renal calculi, solid lesion, or hydronephrosis. Bladder is unremarkable. Stomach/Bowel: Stomach is within normal limits. Appendix appears normal. No evidence of bowel wall thickening, distention, or inflammatory changes. Vascular/Lymphatic: Aortic atherosclerosis. Interval resolution of previously seen left renal vein thrombus (series 2, image 56) as well as left hepatic lobe portal venous thrombus (series 2, image 42). No enlarged abdominal or pelvic lymph nodes. Reproductive: Status post hysterectomy. Other: No abdominal wall hernia or abnormality. No ascites. Musculoskeletal: No acute osseous findings. IMPRESSION: 1. Continued interval enlargement of left lower cervical and supraclavicular lymph nodes, concerning for nodal metastatic disease. 2. Unchanged post treatment appearance of soft tissue in the AP window and no other discretely enlarged mediastinal, hilar, or axillary lymph nodes. 3. Unchanged, hypodense treated lesion of the anterior liver adjacent to the gallbladder fossa, hepatic segment IVB. 4. Unchanged small bilateral subpleural and parenchymal pulmonary nodules. No new nodules. 5. Interval resolution of previously seen left renal vein thrombus as well as left hepatic lobe portal venous thrombus. Aortic Atherosclerosis (ICD10-I70.0). Electronically Signed   By: Delanna Ahmadi M.D.   On: 07/12/2022 13:31     Assessment and plan- Patient is a 71 y.o. female with extensive stage small cell lung cancer with brain metastases.  She is here for on treatment assessment prior to cycle 1 of lurbinectedin  CT scan shows disease progression in  the lower cervical and supraclavicular lymph nodes.  This was biopsy-proven high-grade neuroendocrine carcinoma compatible with lung primary.  Repeat NGS testing is currently pending.  There are no active clinical trials currently  at [facility].  Plan is to therefore proceed with lurbinectedin at this time.  Discussed risks and benefits of lurbinectedin including all but not limited to nausea, vomiting, low blood counts, risk of infections and hospitalizations.In an open-label study of 105 patients with SCLC and no brain metastases who progressed on or after platinum-based chemotherapy, 8 percent of whom had prior immunotherapy in addition to platinum-based chemotherapy, the overall response rate (ORR) with lurbinectedin according to independent review committee was 33 percent [3,4]. The median duration of response was 5.1 months, and 25 percent of responding patients experienced a duration of response exceeding six months. Among patients with resistant relapse (chemotherapy-free interval <90 days), the ORR was 22 percent with a progression-free survival (PFS) of 2.6 months and an overall survival (OS) of 5 months. For patients with sensitive relapse, the ORR was 45 percent with a PFS of 4.5 months and an OS of 11.9 months.   I will see her back in 3 weeks for cycle 2.  I do plan to give her Udenyca on day 3 ofEach treatment given that she has mild baseline leukopenia.  Her white count is 3.2 today with an ANC of 2.0  Hypokalemia: Continue oral potassium    Visit Diagnosis 1. Small cell carcinoma of lung metastatic to liver ([place])   2. Encounter for antineoplastic chemotherapy   3. Goals of care, counseling/discussion   4. Hypokalemia      Dr. Randa Evens, MD, MPH [facility] [phone] 08/09/2022 12:55 PM

## 2022-08-11 ENCOUNTER — Inpatient Hospital Stay: Payer: Medicare HMO

## 2022-08-11 ENCOUNTER — Encounter: Payer: Self-pay | Admitting: Oncology

## 2022-08-11 DIAGNOSIS — E119 Type 2 diabetes mellitus without complications: Secondary | ICD-10-CM | POA: Diagnosis not present

## 2022-08-11 DIAGNOSIS — C787 Secondary malignant neoplasm of liver and intrahepatic bile duct: Secondary | ICD-10-CM | POA: Diagnosis not present

## 2022-08-11 DIAGNOSIS — Z5189 Encounter for other specified aftercare: Secondary | ICD-10-CM | POA: Diagnosis not present

## 2022-08-11 DIAGNOSIS — I10 Essential (primary) hypertension: Secondary | ICD-10-CM | POA: Diagnosis not present

## 2022-08-11 DIAGNOSIS — C3412 Malignant neoplasm of upper lobe, left bronchus or lung: Secondary | ICD-10-CM | POA: Diagnosis not present

## 2022-08-11 DIAGNOSIS — E049 Nontoxic goiter, unspecified: Secondary | ICD-10-CM | POA: Diagnosis not present

## 2022-08-11 DIAGNOSIS — C7931 Secondary malignant neoplasm of brain: Secondary | ICD-10-CM | POA: Diagnosis not present

## 2022-08-11 DIAGNOSIS — D72819 Decreased white blood cell count, unspecified: Secondary | ICD-10-CM | POA: Diagnosis not present

## 2022-08-11 DIAGNOSIS — Z5111 Encounter for antineoplastic chemotherapy: Secondary | ICD-10-CM | POA: Diagnosis not present

## 2022-08-11 MED ORDER — PEGFILGRASTIM-CBQV 6 MG/0.6ML ~~LOC~~ SOSY
6.0000 mg | PREFILLED_SYRINGE | Freq: Once | SUBCUTANEOUS | Status: AC
  Administered 2022-08-11: 6 mg via SUBCUTANEOUS
  Filled 2022-08-11: qty 0.6

## 2022-08-11 NOTE — Telephone Encounter (Signed)
Encounter

## 2022-08-23 ENCOUNTER — Encounter: Payer: Self-pay | Admitting: Oncology

## 2022-08-25 DIAGNOSIS — H6501 Acute serous otitis media, right ear: Secondary | ICD-10-CM | POA: Diagnosis not present

## 2022-08-25 DIAGNOSIS — H6981 Other specified disorders of Eustachian tube, right ear: Secondary | ICD-10-CM | POA: Diagnosis not present

## 2022-08-29 MED FILL — Dexamethasone Sodium Phosphate Inj 100 MG/10ML: INTRAMUSCULAR | Qty: 1 | Status: AC

## 2022-08-30 ENCOUNTER — Inpatient Hospital Stay: Payer: Medicare HMO

## 2022-08-30 ENCOUNTER — Inpatient Hospital Stay (HOSPITAL_BASED_OUTPATIENT_CLINIC_OR_DEPARTMENT_OTHER): Payer: Medicare HMO | Admitting: Oncology

## 2022-08-30 ENCOUNTER — Encounter: Payer: Self-pay | Admitting: Oncology

## 2022-08-30 ENCOUNTER — Inpatient Hospital Stay: Payer: Medicare HMO | Attending: Oncology

## 2022-08-30 VITALS — BP 125/71 | HR 57 | Temp 96.8°F | Resp 16 | Wt 163.5 lb

## 2022-08-30 DIAGNOSIS — Z87891 Personal history of nicotine dependence: Secondary | ICD-10-CM | POA: Insufficient documentation

## 2022-08-30 DIAGNOSIS — C3490 Malignant neoplasm of unspecified part of unspecified bronchus or lung: Secondary | ICD-10-CM | POA: Diagnosis not present

## 2022-08-30 DIAGNOSIS — G893 Neoplasm related pain (acute) (chronic): Secondary | ICD-10-CM

## 2022-08-30 DIAGNOSIS — I10 Essential (primary) hypertension: Secondary | ICD-10-CM | POA: Insufficient documentation

## 2022-08-30 DIAGNOSIS — R160 Hepatomegaly, not elsewhere classified: Secondary | ICD-10-CM | POA: Diagnosis not present

## 2022-08-30 DIAGNOSIS — Z5111 Encounter for antineoplastic chemotherapy: Secondary | ICD-10-CM | POA: Diagnosis not present

## 2022-08-30 DIAGNOSIS — C7931 Secondary malignant neoplasm of brain: Secondary | ICD-10-CM | POA: Diagnosis not present

## 2022-08-30 DIAGNOSIS — Z7901 Long term (current) use of anticoagulants: Secondary | ICD-10-CM | POA: Diagnosis not present

## 2022-08-30 DIAGNOSIS — R590 Localized enlarged lymph nodes: Secondary | ICD-10-CM | POA: Insufficient documentation

## 2022-08-30 DIAGNOSIS — Z79899 Other long term (current) drug therapy: Secondary | ICD-10-CM | POA: Insufficient documentation

## 2022-08-30 DIAGNOSIS — C787 Secondary malignant neoplasm of liver and intrahepatic bile duct: Secondary | ICD-10-CM

## 2022-08-30 DIAGNOSIS — Z5941 Food insecurity: Secondary | ICD-10-CM | POA: Diagnosis not present

## 2022-08-30 DIAGNOSIS — C3412 Malignant neoplasm of upper lobe, left bronchus or lung: Secondary | ICD-10-CM | POA: Diagnosis not present

## 2022-08-30 LAB — CBC WITH DIFFERENTIAL/PLATELET
Abs Immature Granulocytes: 0.01 10*3/uL (ref 0.00–0.07)
Basophils Absolute: 0 10*3/uL (ref 0.0–0.1)
Basophils Relative: 1 %
Eosinophils Absolute: 0 10*3/uL (ref 0.0–0.5)
Eosinophils Relative: 0 %
HCT: 35 % — ABNORMAL LOW (ref 36.0–46.0)
Hemoglobin: 11.8 g/dL — ABNORMAL LOW (ref 12.0–15.0)
Immature Granulocytes: 0 %
Lymphocytes Relative: 17 %
Lymphs Abs: 0.6 10*3/uL — ABNORMAL LOW (ref 0.7–4.0)
MCH: 30.2 pg (ref 26.0–34.0)
MCHC: 33.7 g/dL (ref 30.0–36.0)
MCV: 89.5 fL (ref 80.0–100.0)
Monocytes Absolute: 0.4 10*3/uL (ref 0.1–1.0)
Monocytes Relative: 13 %
Neutro Abs: 2.4 10*3/uL (ref 1.7–7.7)
Neutrophils Relative %: 69 %
Platelets: 328 10*3/uL (ref 150–400)
RBC: 3.91 MIL/uL (ref 3.87–5.11)
RDW: 15 % (ref 11.5–15.5)
WBC: 3.5 10*3/uL — ABNORMAL LOW (ref 4.0–10.5)
nRBC: 0 % (ref 0.0–0.2)

## 2022-08-30 LAB — COMPREHENSIVE METABOLIC PANEL
ALT: 11 U/L (ref 0–44)
AST: 14 U/L — ABNORMAL LOW (ref 15–41)
Albumin: 4.1 g/dL (ref 3.5–5.0)
Alkaline Phosphatase: 91 U/L (ref 38–126)
Anion gap: 7 (ref 5–15)
BUN: 19 mg/dL (ref 8–23)
CO2: 24 mmol/L (ref 22–32)
Calcium: 9.5 mg/dL (ref 8.9–10.3)
Chloride: 108 mmol/L (ref 98–111)
Creatinine, Ser: 0.6 mg/dL (ref 0.44–1.00)
GFR, Estimated: >60 mL/min (ref >60)
Glucose, Bld: 108 mg/dL — ABNORMAL HIGH (ref 70–99)
Potassium: 3.7 mmol/L (ref 3.5–5.1)
Sodium: 139 mmol/L (ref 135–145)
Total Bilirubin: 0.6 mg/dL (ref 0.3–1.2)
Total Protein: 7.6 g/dL (ref 6.5–8.1)

## 2022-08-30 MED ORDER — HEPARIN SOD (PORK) LOCK FLUSH 100 UNIT/ML IV SOLN
500.0000 [IU] | Freq: Once | INTRAVENOUS | Status: AC | PRN
  Administered 2022-08-30: 500 [IU]
  Filled 2022-08-30: qty 5

## 2022-08-30 MED ORDER — PALONOSETRON HCL INJECTION 0.25 MG/5ML
0.2500 mg | Freq: Once | INTRAVENOUS | Status: AC
  Administered 2022-08-30: 0.25 mg via INTRAVENOUS
  Filled 2022-08-30: qty 5

## 2022-08-30 MED ORDER — SODIUM CHLORIDE 0.9% FLUSH
10.0000 mL | INTRAVENOUS | Status: DC | PRN
  Administered 2022-08-30: 10 mL via INTRAVENOUS
  Filled 2022-08-30: qty 10

## 2022-08-30 MED ORDER — HEPARIN SOD (PORK) LOCK FLUSH 100 UNIT/ML IV SOLN
500.0000 [IU] | Freq: Once | INTRAVENOUS | Status: DC
  Filled 2022-08-30: qty 5

## 2022-08-30 MED ORDER — SODIUM CHLORIDE 0.9% FLUSH
10.0000 mL | INTRAVENOUS | Status: DC | PRN
  Filled 2022-08-30: qty 10

## 2022-08-30 MED ORDER — SODIUM CHLORIDE 0.9 % IV SOLN
Freq: Once | INTRAVENOUS | Status: AC
  Filled 2022-08-30: qty 250

## 2022-08-30 MED ORDER — SODIUM CHLORIDE 0.9 % IV SOLN
10.0000 mg | Freq: Once | INTRAVENOUS | Status: AC
  Administered 2022-08-30: 10 mg via INTRAVENOUS
  Filled 2022-08-30: qty 10

## 2022-08-30 MED ORDER — SODIUM CHLORIDE 0.9 % IV SOLN
3.2000 mg/m2 | Freq: Once | INTRAVENOUS | Status: AC
  Administered 2022-08-30: 5.75 mg via INTRAVENOUS
  Filled 2022-08-30: qty 11.5

## 2022-08-30 NOTE — Patient Instructions (Signed)
[facility] CANCER CTR AT [place]  Discharge Instructions: Thank you for choosing [facility] to provide your oncology and hematology care.  If you have a lab appointment with the [facility], please go directly to the [facility] and check in at the registration area.  Wear comfortable clothing and clothing appropriate for easy access to any Portacath or PICC line.   We strive to give you quality time with your provider. You may need to reschedule your appointment if you arrive late (15 or more minutes).  Arriving late affects you and other patients whose appointments are after yours.  Also, if you miss three or more appointments without notifying the office, you may be dismissed from the clinic at the provider's discretion.      For prescription refill requests, have your pharmacy contact our office and allow 72 hours for refills to be completed.    Today you received the following chemotherapy and/or immunotherapy agents- Lurbinectedin       To help prevent nausea and vomiting after your treatment, we encourage you to take your nausea medication as directed.  BELOW ARE SYMPTOMS THAT SHOULD BE REPORTED IMMEDIATELY: *FEVER GREATER THAN 100.4 F (38 C) OR HIGHER *CHILLS OR SWEATING *NAUSEA AND VOMITING THAT IS NOT CONTROLLED WITH YOUR NAUSEA MEDICATION *UNUSUAL SHORTNESS OF BREATH *UNUSUAL BRUISING OR BLEEDING *URINARY PROBLEMS (pain or burning when urinating, or frequent urination) *BOWEL PROBLEMS (unusual diarrhea, constipation, pain near the anus) TENDERNESS IN MOUTH AND THROAT WITH OR WITHOUT PRESENCE OF ULCERS (sore throat, sores in mouth, or a toothache) UNUSUAL RASH, SWELLING OR PAIN  UNUSUAL VAGINAL DISCHARGE OR ITCHING   Items with * indicate a potential emergency and should be followed up as soon as possible or go to the Emergency Department if any problems should occur.  Please show the CHEMOTHERAPY ALERT CARD or IMMUNOTHERAPY ALERT CARD at  check-in to the Emergency Department and triage nurse.  Should you have questions after your visit or need to cancel or reschedule your appointment, please contact [facility] CANCER [place] AT [place]  [phone] and follow the prompts.  Office hours are 8:00 a.m. to 4:30 p.m. Monday - Friday. Please note that voicemails left after 4:00 p.m. may not be returned until the following business day.  We are closed weekends and major holidays. You have access to a nurse at all times for urgent questions. Please call the main number to the clinic [phone] and follow the prompts.  For any non-urgent questions, you may also contact your provider using MyChart. We now offer e-Visits for anyone 6 and older to request care online for non-urgent symptoms. For details visit [email or web address].   Also download the MyChart app! Go to the app store, search "MyChart", open the app, select [facility], and log in with your MyChart username and password.  Masks are optional in the cancer centers. If you would like for your care team to wear a mask while they are taking care of you, please let them know. For doctor visits, patients may have with them one support person who is at least 72 years old. At this time, visitors are not allowed in the infusion area.

## 2022-08-30 NOTE — Progress Notes (Signed)
Hematology/Oncology Consult note [facility]  Telephone:([phone] [phone]  Patient Care Team: Leonel Ramsay, MD as PCP - General (Infectious Diseases) Telford Nab, RN as Oncology Nurse Navigator Sindy Guadeloupe, MD as Consulting Physician (Hematology and Oncology)   Name of the patient: Doris Johnson  [id number]  09/24/1951   Date of visit: 08/30/22  Diagnosis-  extensive stage small cell lung cancer with liver and brain metastases  Chief complaint/ Reason for visit-on treatment assessment prior to cycle 2 of lurbinectedin  Heme/Onc history: Patient is a 71 year old female with a remote history of smoking in her teenage years and exposure to passive smoking.  She has been having ongoing midsternal pain which has been growing since the last 6 to 7 months.  She had been to urgent care as well in the past.  She finally had a CT chest with contrast done in October 2021 which showed a large mass in the left side of the mediastinum measuring 6.7 x 6.2 x 6.1 cm causing severe compression of the left main pulmonary artery and around the aortic arch in the left subclavian artery.  Enlarged thyroid gland.  Left upper lobe nodule 1 x 0.7 cm.  She then underwent bronchoscopy with Dr.Aleskerov left upper lobe biopsy was nondiagnostic.  However station 4, 7 and 10 L lymph nodes were consistent with metastatic small cell carcinoma.   PET CT scan showed enlarged level 3 cervical lymph node 2.2 cm that was hypermetabolic with an SUV of 9.7.  Large central 7.3 cm AP window mass.  5.7 cm left liver mass.  MRI brain with and without contrast showed 2 small foci of enhancement in the right cerebellar hemisphere and right temporal lobe without mass-effect or edema as well concerning for brain metastases   Patient had 2 cycles of carbo etoposide chemotherapy along with Tecentriq and subsequent scan showed no significant improvement in the primary lung mass or liver  mass.  She received radiation treatment to her primary lung mass and also seen by [facility] for second opinion.  Her pathology was reviewed at [facility] and reported as possible neuroendocrine neoplasm But stated that small cell carcinoma cannot be excluded but not definitely diagnostic for it.  However [facility] pathology feels that this is consistent with small cell lung cancer.  She has completed 4 cycles of carbo etoposide Tecentriq chemotherapy and is currently on maintenance Tecentriq.  Repeat liver biopsy was also performed and was consistent with small cell lung cancer   Disease progression after 23 cycles of maintenance Tecentriq in the left supraclavicular lymph node region.  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  Plan to switch patient to second line lurbinectedin  Interval history-patient tolerated cycle 1 of chemotherapy well without any significant side effects.  She denies any nausea or vomiting at this time  ECOG PS- 1 Pain scale- 0   Review of systems- Review of Systems  Constitutional:  Negative for chills, fever, malaise/fatigue and weight loss.  HENT:  Negative for congestion, ear discharge and nosebleeds.   Eyes:  Negative for blurred vision.  Respiratory:  Negative for cough, hemoptysis, sputum production, shortness of breath and wheezing.   Cardiovascular:  Negative for chest pain, palpitations, orthopnea and claudication.  Gastrointestinal:  Negative for abdominal pain, blood in stool, constipation, diarrhea, heartburn, melena, nausea and vomiting.  Genitourinary:  Negative for dysuria, flank pain, frequency, hematuria and urgency.  Musculoskeletal:  Negative for back pain, joint pain and myalgias.  Skin:  Negative for rash.  Neurological:  Negative for dizziness, tingling, focal weakness, seizures, weakness and headaches.  Endo/Heme/Allergies:  Does not bruise/bleed easily.  Psychiatric/Behavioral:  Negative for depression and suicidal ideas.  The patient does not have insomnia.       No Known Allergies   Past Medical History:  Diagnosis Date   Cancer ([place])    Cataract    Diabetes mellitus without complication ([place])    Hyperlipidemia    Hypertension    Hypothyroidism    doctor's keeping an eye on thyroid    Lung cancer [facility])      Past Surgical History:  Procedure Laterality Date   ABDOMINAL HYSTERECTOMY     IR CV LINE INJECTION  09/28/2021   PORTA CATH INSERTION N/A 11/30/2020   Procedure: PORTA CATH INSERTION;  Surgeon: Algernon Huxley, MD;  Location: [place] [facility];  Service: Cardiovascular;  Laterality: N/A;   VIDEO BRONCHOSCOPY WITH ENDOBRONCHIAL NAVIGATION N/A 10/21/2020   Procedure: VIDEO BRONCHOSCOPY WITH ENDOBRONCHIAL NAVIGATION;  Surgeon: Ottie Glazier, MD;  Location: [facility] ORS;  Service: Thoracic;  Laterality: N/A;   VIDEO BRONCHOSCOPY WITH ENDOBRONCHIAL ULTRASOUND N/A 10/21/2020   Procedure: VIDEO BRONCHOSCOPY WITH ENDOBRONCHIAL ULTRASOUND;  Surgeon: Ottie Glazier, MD;  Location: [facility] ORS;  Service: Thoracic;  Laterality: N/A;    Social History   Socioeconomic History   Marital status: Single    Spouse name: Not on file   Number of children: Not on file   Years of education: Not on file   Highest education level: Not on file  Occupational History   Not on file  Tobacco Use   Smoking status: Never   Smokeless tobacco: Never  Vaping Use   Vaping Use: Never used  Substance and Sexual Activity   Alcohol use: No   Drug use: No   Sexual activity: Not Currently  Other Topics Concern   Not on file  Social History Narrative   Not on file   Social Determinants of Health   Financial Resource Strain: Not on file  Food Insecurity: Food Insecurity Present (07/08/2021)   Hunger Vital Sign    Worried About Running Out of Food in the Last Year: Sometimes true    Ran Out of Food in the Last Year: Sometimes true  Transportation Needs: No Transportation Needs (04/05/2022)   PRAPARE - Armed forces logistics/support/administrative officer (Medical): No    Lack of Transportation (Non-Medical): No  Physical Activity: Not on file  Stress: Not on file  Social Connections: Not on file  Intimate Partner Violence: Not on file    Family History  Problem Relation Age of Onset   Cancer Mother        breast   Hypertension Mother    Breast cancer Mother 64   Heart disease Father    Hyperlipidemia Brother    Heart disease Brother      Current Outpatient Medications:    apixaban (ELIQUIS) 5 MG TABS tablet, Take 1 tablet (5 mg total) by mouth 2 (two) times daily., Disp: 60 tablet, Rfl: 1   dexamethasone (DECADRON) 4 MG tablet, Take 1 tablet (4 mg total) by mouth 2 (two) times daily with a meal., Disp: 60 tablet, Rfl: 1   furosemide (LASIX) 20 MG tablet, Take 1 tablet (20 mg total) by mouth daily., Disp: 60 tablet, Rfl: 0   lidocaine-prilocaine (EMLA) cream, Apply 1 application. topically as needed. Apply small amount to port site at least 1 hour prior to it  being accessed, cover with plastic wrap, Disp: 30 g, Rfl: 1   losartan (COZAAR) 25 MG tablet, Take 25 mg by mouth daily., Disp: , Rfl:    metFORMIN (GLUCOPHAGE-XR) 500 MG 24 hr tablet, Take 500 mg by mouth daily with breakfast., Disp: , Rfl:    mirtazapine (REMERON) 15 MG tablet, Take 15 mg by mouth at bedtime., Disp: , Rfl:    oxycodone (ROXICODONE) 30 MG immediate release tablet, Take 1 tablet (30 mg total) by mouth every 4 (four) hours as needed for pain., Disp: 180 tablet, Rfl: 0   potassium chloride SA (KLOR-CON M) 20 MEQ tablet, Take 2 tablets (40 mEq total) by mouth daily., Disp: 7 tablet, Rfl: 0   traZODone (DESYREL) 50 MG tablet, Take by mouth., Disp: , Rfl:    fluticasone (FLONASE) 50 MCG/ACT nasal spray, Place 2 sprays into both nostrils daily. (Patient not taking: Reported on 08/30/2022), Disp: 16 g, Rfl: 5   lansoprazole (PREVACID) 30 MG capsule, Take 1 capsule (30 mg total) by mouth daily. (Patient not taking: Reported on 08/30/2022), Disp:  30 capsule, Rfl: 1   naloxone (NARCAN) nasal spray 4 mg/0.1 mL, , Disp: , Rfl:    polyethylene glycol (MIRALAX / GLYCOLAX) 17 g packet, Take 17 g by mouth daily. (Patient not taking: Reported on 07/19/2022), Disp: , Rfl:  No current facility-administered medications for this visit.  Facility-Administered Medications Ordered in Other Visits:    heparin lock flush 100 unit/mL, 500 Units, Intracatheter, Once PRN, Sindy Guadeloupe, MD   lurbinectedin (ZEPZELCA) 5.75 mg in sodium chloride 0.9 % 250 mL chemo infusion, 3.2 mg/m2 (Treatment Plan Recorded), Intravenous, Once, Sindy Guadeloupe, MD, Last Rate: 262 mL/hr at 08/30/22 1054, 5.75 mg at 08/30/22 1054   sodium chloride flush (NS) 0.9 % injection 10 mL, 10 mL, Intravenous, PRN, Sindy Guadeloupe, MD, 10 mL at 12/15/20 0850   sodium chloride flush (NS) 0.9 % injection 10 mL, 10 mL, Intracatheter, PRN, Sindy Guadeloupe, MD  Physical exam:  Vitals:   08/30/22 0915  BP: 125/71  Pulse: (!) 57  Resp: 16  Temp: (!) 96.8 F (36 C)  SpO2: 100%  Weight: 163 lb 8 oz (74.2 kg)   Physical Exam Constitutional:      General: She is not in acute distress. Cardiovascular:     Rate and Rhythm: Normal rate and regular rhythm.     Heart sounds: Normal heart sounds.  Pulmonary:     Effort: Pulmonary effort is normal.     Breath sounds: Normal breath sounds.  Lymphadenopathy:     Comments: Palpable left supraclavicular adenopathy not significantly smaller as compared to 3 weeks ago  Skin:    General: Skin is warm and dry.  Neurological:     Mental Status: She is alert and oriented to person, place, and time.         Latest Ref Rng & Units 08/30/2022    8:55 AM  CMP  Glucose 70 - 99 mg/dL 108   BUN 8 - 23 mg/dL 19   Creatinine 0.44 - 1.00 mg/dL 0.60   Sodium 135 - 145 mmol/L 139   Potassium 3.5 - 5.1 mmol/L 3.7   Chloride 98 - 111 mmol/L 108   CO2 22 - 32 mmol/L 24   Calcium 8.9 - 10.3 mg/dL 9.5   Total Protein 6.5 - 8.1 g/dL 7.6   Total Bilirubin  0.3 - 1.2 mg/dL 0.6   Alkaline Phos 38 - 126 U/L 91  AST 15 - 41 U/L 14   ALT 0 - 44 U/L 11       Latest Ref Rng & Units 08/30/2022    8:55 AM  CBC  WBC 4.0 - 10.5 K/uL 3.5   Hemoglobin 12.0 - 15.0 g/dL 11.8   Hematocrit 36.0 - 46.0 % 35.0   Platelets 150 - 400 K/uL 328      Assessment and plan- Patient is a 71 y.o. female with extensive stage small cell lung cancer with brain metastases.  She is here for on treatment assessment prior to cycle 2 of lurbinectedin  Counts okay to proceed with cycle 2 of lurbinectedin today with Udenyca on day 3.  I will see her back in 3 weeks for cycle 3.  Plan to repeat scans after 4 cycles.  She still has palpable left supraclavicular adenopathy which is not significantly smaller as compared to 3 weeks ago.  Continue to monitor  Neoplasm related pain: Continue as needed oxycodone Visit Diagnosis 1. Small cell carcinoma of lung metastatic to liver ([place])   2. Encounter for antineoplastic chemotherapy   3. Neoplasm related pain      Dr. Randa Evens, MD, MPH [facility] at [facility] [phone] 08/30/2022 11:08 AM

## 2022-09-01 ENCOUNTER — Inpatient Hospital Stay: Payer: Medicare HMO

## 2022-09-01 DIAGNOSIS — I10 Essential (primary) hypertension: Secondary | ICD-10-CM | POA: Diagnosis not present

## 2022-09-01 DIAGNOSIS — C787 Secondary malignant neoplasm of liver and intrahepatic bile duct: Secondary | ICD-10-CM

## 2022-09-01 DIAGNOSIS — R590 Localized enlarged lymph nodes: Secondary | ICD-10-CM | POA: Diagnosis not present

## 2022-09-01 DIAGNOSIS — C7931 Secondary malignant neoplasm of brain: Secondary | ICD-10-CM | POA: Diagnosis not present

## 2022-09-01 DIAGNOSIS — Z5941 Food insecurity: Secondary | ICD-10-CM | POA: Diagnosis not present

## 2022-09-01 DIAGNOSIS — G893 Neoplasm related pain (acute) (chronic): Secondary | ICD-10-CM | POA: Diagnosis not present

## 2022-09-01 DIAGNOSIS — R160 Hepatomegaly, not elsewhere classified: Secondary | ICD-10-CM | POA: Diagnosis not present

## 2022-09-01 DIAGNOSIS — C3412 Malignant neoplasm of upper lobe, left bronchus or lung: Secondary | ICD-10-CM | POA: Diagnosis not present

## 2022-09-01 DIAGNOSIS — Z5111 Encounter for antineoplastic chemotherapy: Secondary | ICD-10-CM | POA: Diagnosis not present

## 2022-09-01 MED ORDER — PEGFILGRASTIM-CBQV 6 MG/0.6ML ~~LOC~~ SOSY
6.0000 mg | PREFILLED_SYRINGE | Freq: Once | SUBCUTANEOUS | Status: AC
  Administered 2022-09-01: 6 mg via SUBCUTANEOUS
  Filled 2022-09-01: qty 0.6

## 2022-09-06 ENCOUNTER — Other Ambulatory Visit: Payer: Self-pay | Admitting: Internal Medicine

## 2022-09-08 ENCOUNTER — Encounter: Payer: Self-pay | Admitting: Oncology

## 2022-09-15 DIAGNOSIS — H6501 Acute serous otitis media, right ear: Secondary | ICD-10-CM | POA: Diagnosis not present

## 2022-09-15 DIAGNOSIS — H90A31 Mixed conductive and sensorineural hearing loss, unilateral, right ear with restricted hearing on the contralateral side: Secondary | ICD-10-CM | POA: Diagnosis not present

## 2022-09-15 DIAGNOSIS — H6981 Other specified disorders of Eustachian tube, right ear: Secondary | ICD-10-CM | POA: Diagnosis not present

## 2022-09-20 ENCOUNTER — Inpatient Hospital Stay: Payer: Medicare HMO | Attending: Oncology

## 2022-09-20 ENCOUNTER — Inpatient Hospital Stay: Payer: Medicare HMO

## 2022-09-20 ENCOUNTER — Inpatient Hospital Stay (HOSPITAL_BASED_OUTPATIENT_CLINIC_OR_DEPARTMENT_OTHER): Payer: Medicare HMO | Admitting: Oncology

## 2022-09-20 ENCOUNTER — Encounter: Payer: Self-pay | Admitting: Oncology

## 2022-09-20 VITALS — BP 138/71 | HR 54 | Temp 97.5°F | Resp 16 | Wt 165.3 lb

## 2022-09-20 DIAGNOSIS — D701 Agranulocytosis secondary to cancer chemotherapy: Secondary | ICD-10-CM | POA: Insufficient documentation

## 2022-09-20 DIAGNOSIS — Z7952 Long term (current) use of systemic steroids: Secondary | ICD-10-CM | POA: Diagnosis not present

## 2022-09-20 DIAGNOSIS — E049 Nontoxic goiter, unspecified: Secondary | ICD-10-CM | POA: Insufficient documentation

## 2022-09-20 DIAGNOSIS — C3490 Malignant neoplasm of unspecified part of unspecified bronchus or lung: Secondary | ICD-10-CM

## 2022-09-20 DIAGNOSIS — C3412 Malignant neoplasm of upper lobe, left bronchus or lung: Secondary | ICD-10-CM | POA: Diagnosis not present

## 2022-09-20 DIAGNOSIS — C7931 Secondary malignant neoplasm of brain: Secondary | ICD-10-CM | POA: Diagnosis not present

## 2022-09-20 DIAGNOSIS — C787 Secondary malignant neoplasm of liver and intrahepatic bile duct: Secondary | ICD-10-CM | POA: Diagnosis not present

## 2022-09-20 DIAGNOSIS — Z5189 Encounter for other specified aftercare: Secondary | ICD-10-CM | POA: Diagnosis not present

## 2022-09-20 DIAGNOSIS — Z5111 Encounter for antineoplastic chemotherapy: Secondary | ICD-10-CM | POA: Insufficient documentation

## 2022-09-20 DIAGNOSIS — Z79899 Other long term (current) drug therapy: Secondary | ICD-10-CM | POA: Diagnosis not present

## 2022-09-20 DIAGNOSIS — I10 Essential (primary) hypertension: Secondary | ICD-10-CM | POA: Diagnosis not present

## 2022-09-20 DIAGNOSIS — Z7901 Long term (current) use of anticoagulants: Secondary | ICD-10-CM | POA: Diagnosis not present

## 2022-09-20 DIAGNOSIS — R5383 Other fatigue: Secondary | ICD-10-CM | POA: Insufficient documentation

## 2022-09-20 DIAGNOSIS — Z8249 Family history of ischemic heart disease and other diseases of the circulatory system: Secondary | ICD-10-CM | POA: Insufficient documentation

## 2022-09-20 DIAGNOSIS — Z8349 Family history of other endocrine, nutritional and metabolic diseases: Secondary | ICD-10-CM | POA: Diagnosis not present

## 2022-09-20 DIAGNOSIS — Z87891 Personal history of nicotine dependence: Secondary | ICD-10-CM | POA: Insufficient documentation

## 2022-09-20 DIAGNOSIS — E119 Type 2 diabetes mellitus without complications: Secondary | ICD-10-CM | POA: Diagnosis not present

## 2022-09-20 DIAGNOSIS — Z5941 Food insecurity: Secondary | ICD-10-CM | POA: Diagnosis not present

## 2022-09-20 DIAGNOSIS — T451X5A Adverse effect of antineoplastic and immunosuppressive drugs, initial encounter: Secondary | ICD-10-CM | POA: Insufficient documentation

## 2022-09-20 DIAGNOSIS — D72819 Decreased white blood cell count, unspecified: Secondary | ICD-10-CM | POA: Diagnosis not present

## 2022-09-20 DIAGNOSIS — Z803 Family history of malignant neoplasm of breast: Secondary | ICD-10-CM | POA: Diagnosis not present

## 2022-09-20 LAB — CBC WITH DIFFERENTIAL/PLATELET
Abs Immature Granulocytes: 0.01 K/uL (ref 0.00–0.07)
Basophils Absolute: 0 K/uL (ref 0.0–0.1)
Basophils Relative: 0 %
Eosinophils Absolute: 0 K/uL (ref 0.0–0.5)
Eosinophils Relative: 1 %
HCT: 34.8 % — ABNORMAL LOW (ref 36.0–46.0)
Hemoglobin: 11.5 g/dL — ABNORMAL LOW (ref 12.0–15.0)
Immature Granulocytes: 0 %
Lymphocytes Relative: 22 %
Lymphs Abs: 0.7 K/uL (ref 0.7–4.0)
MCH: 30.5 pg (ref 26.0–34.0)
MCHC: 33 g/dL (ref 30.0–36.0)
MCV: 92.3 fL (ref 80.0–100.0)
Monocytes Absolute: 0.4 K/uL (ref 0.1–1.0)
Monocytes Relative: 14 %
Neutro Abs: 1.9 K/uL (ref 1.7–7.7)
Neutrophils Relative %: 63 %
Platelets: 287 K/uL (ref 150–400)
RBC: 3.77 MIL/uL — ABNORMAL LOW (ref 3.87–5.11)
RDW: 17.2 % — ABNORMAL HIGH (ref 11.5–15.5)
WBC: 3 K/uL — ABNORMAL LOW (ref 4.0–10.5)
nRBC: 0 % (ref 0.0–0.2)

## 2022-09-20 LAB — COMPREHENSIVE METABOLIC PANEL WITH GFR
ALT: 9 U/L (ref 0–44)
AST: 15 U/L (ref 15–41)
Albumin: 4.1 g/dL (ref 3.5–5.0)
Alkaline Phosphatase: 90 U/L (ref 38–126)
Anion gap: 7 (ref 5–15)
BUN: 10 mg/dL (ref 8–23)
CO2: 24 mmol/L (ref 22–32)
Calcium: 9.8 mg/dL (ref 8.9–10.3)
Chloride: 107 mmol/L (ref 98–111)
Creatinine, Ser: 0.51 mg/dL (ref 0.44–1.00)
GFR, Estimated: >60 mL/min
Glucose, Bld: 95 mg/dL (ref 70–99)
Potassium: 3.9 mmol/L (ref 3.5–5.1)
Sodium: 138 mmol/L (ref 135–145)
Total Bilirubin: 0.5 mg/dL (ref 0.3–1.2)
Total Protein: 7.3 g/dL (ref 6.5–8.1)

## 2022-09-20 MED ORDER — HEPARIN SOD (PORK) LOCK FLUSH 100 UNIT/ML IV SOLN
500.0000 [IU] | Freq: Once | INTRAVENOUS | Status: AC | PRN
  Administered 2022-09-20: 500 [IU]
  Filled 2022-09-20: qty 5

## 2022-09-20 MED ORDER — SODIUM CHLORIDE 0.9% FLUSH
10.0000 mL | INTRAVENOUS | Status: DC | PRN
  Administered 2022-09-20: 10 mL via INTRAVENOUS
  Filled 2022-09-20: qty 10

## 2022-09-20 MED ORDER — PALONOSETRON HCL INJECTION 0.25 MG/5ML
0.2500 mg | Freq: Once | INTRAVENOUS | Status: AC
  Administered 2022-09-20: 0.25 mg via INTRAVENOUS
  Filled 2022-09-20: qty 5

## 2022-09-20 MED ORDER — SODIUM CHLORIDE 0.9 % IV SOLN
10.0000 mg | Freq: Once | INTRAVENOUS | Status: AC
  Administered 2022-09-20: 10 mg via INTRAVENOUS
  Filled 2022-09-20: qty 10

## 2022-09-20 MED ORDER — SODIUM CHLORIDE 0.9 % IV SOLN
3.2000 mg/m2 | Freq: Once | INTRAVENOUS | Status: AC
  Administered 2022-09-20: 5.75 mg via INTRAVENOUS
  Filled 2022-09-20: qty 11.5

## 2022-09-20 MED ORDER — SODIUM CHLORIDE 0.9 % IV SOLN
Freq: Once | INTRAVENOUS | Status: AC
  Filled 2022-09-20: qty 250

## 2022-09-20 NOTE — Progress Notes (Signed)
Hematology/Oncology Consult note [facility]  Telephone:([phone] [phone]  Patient Care Team: Leonel Ramsay, MD as PCP - General (Infectious Diseases) Telford Nab, RN as Oncology Nurse Navigator Sindy Guadeloupe, MD as Consulting Physician (Hematology and Oncology)   Name of the patient: Doris Johnson  [id number]  02/01/1951   Date of visit: 09/20/22  Diagnosis-extensive stage small cell lung cancer with liver lymph node and brain metastases  Chief complaint/ Reason for visit-on treatment assessment prior to cycle 3 of lurbinectedin  Heme/Onc history: Patient is a 71 year old female with a remote history of smoking in her teenage years and exposure to passive smoking.  She has been having ongoing midsternal pain which has been growing since the last 6 to 7 months.  She had been to urgent care as well in the past.  She finally had a CT chest with contrast done in October 2021 which showed a large mass in the left side of the mediastinum measuring 6.7 x 6.2 x 6.1 cm causing severe compression of the left main pulmonary artery and around the aortic arch in the left subclavian artery.  Enlarged thyroid gland.  Left upper lobe nodule 1 x 0.7 cm.  She then underwent bronchoscopy with Dr.Aleskerov left upper lobe biopsy was nondiagnostic.  However station 4, 7 and 10 L lymph nodes were consistent with metastatic small cell carcinoma.   PET CT scan showed enlarged level 3 cervical lymph node 2.2 cm that was hypermetabolic with an SUV of 9.7.  Large central 7.3 cm AP window mass.  5.7 cm left liver mass.  MRI brain with and without contrast showed 2 small foci of enhancement in the right cerebellar hemisphere and right temporal lobe without mass-effect or edema as well concerning for brain metastases   Patient had 2 cycles of carbo etoposide chemotherapy along with Tecentriq and subsequent scan showed no significant improvement in the primary lung mass  or liver mass.  She received radiation treatment to her primary lung mass and also seen by [facility] for second opinion.  Her pathology was reviewed at [facility] and reported as possible neuroendocrine neoplasm But stated that small cell carcinoma cannot be excluded but not definitely diagnostic for it.  However [facility] pathology feels that this is consistent with small cell lung cancer.  She has completed 4 cycles of carbo etoposide Tecentriq chemotherapy and is currently on maintenance Tecentriq.  Repeat liver biopsy was also performed and was consistent with small cell lung cancer   Disease progression after 23 cycles of maintenance Tecentriq in the left supraclavicular lymph node region.  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  Plan to switch patient to second line lurbinectedin    Interval history-patient is tolerating treatments well so far.  Denies any significant side effects.  Appetite and weight have remained stable.  Denies any significant nausea or vomiting.  ECOG PS- 1 Pain scale- 0 Opioid associated constipation- no  Review of systems- Review of Systems  Constitutional:  Negative for chills, fever, malaise/fatigue and weight loss.  HENT:  Negative for congestion, ear discharge and nosebleeds.   Eyes:  Negative for blurred vision.  Respiratory:  Negative for cough, hemoptysis, sputum production, shortness of breath and wheezing.   Cardiovascular:  Negative for chest pain, palpitations, orthopnea and claudication.  Gastrointestinal:  Negative for abdominal pain, blood in stool, constipation, diarrhea, heartburn, melena, nausea and vomiting.  Genitourinary:  Negative for dysuria, flank pain, frequency, hematuria and urgency.  Musculoskeletal:  Negative for back pain, joint pain and myalgias.  Skin:  Negative for rash.  Neurological:  Negative for dizziness, tingling, focal weakness, seizures, weakness and headaches.  Endo/Heme/Allergies:  Does not  bruise/bleed easily.  Psychiatric/Behavioral:  Negative for depression and suicidal ideas. The patient does not have insomnia.       No Known Allergies   Past Medical History:  Diagnosis Date   Cancer ([place])    Cataract    Diabetes mellitus without complication ([place])    Hyperlipidemia    Hypertension    Hypothyroidism    doctor's keeping an eye on thyroid    Lung cancer [facility])      Past Surgical History:  Procedure Laterality Date   ABDOMINAL HYSTERECTOMY     IR CV LINE INJECTION  09/28/2021   PORTA CATH INSERTION N/A 11/30/2020   Procedure: PORTA CATH INSERTION;  Surgeon: Algernon Huxley, MD;  Location: [place] [facility];  Service: Cardiovascular;  Laterality: N/A;   VIDEO BRONCHOSCOPY WITH ENDOBRONCHIAL NAVIGATION N/A 10/21/2020   Procedure: VIDEO BRONCHOSCOPY WITH ENDOBRONCHIAL NAVIGATION;  Surgeon: Ottie Glazier, MD;  Location: [facility] ORS;  Service: Thoracic;  Laterality: N/A;   VIDEO BRONCHOSCOPY WITH ENDOBRONCHIAL ULTRASOUND N/A 10/21/2020   Procedure: VIDEO BRONCHOSCOPY WITH ENDOBRONCHIAL ULTRASOUND;  Surgeon: Ottie Glazier, MD;  Location: [facility] ORS;  Service: Thoracic;  Laterality: N/A;    Social History   Socioeconomic History   Marital status: Single    Spouse name: Not on file   Number of children: Not on file   Years of education: Not on file   Highest education level: Not on file  Occupational History   Not on file  Tobacco Use   Smoking status: Never   Smokeless tobacco: Never  Vaping Use   Vaping Use: Never used  Substance and Sexual Activity   Alcohol use: No   Drug use: No   Sexual activity: Not Currently  Other Topics Concern   Not on file  Social History Narrative   Not on file   Social Determinants of Health   Financial Resource Strain: Not on file  Food Insecurity: Food Insecurity Present (07/08/2021)   Hunger Vital Sign    Worried About Running Out of Food in the Last Year: Sometimes true    Ran Out of Food in the Last Year: Sometimes  true  Transportation Needs: Unmet Transportation Needs (09/20/2022)   PRAPARE - Hydrologist (Medical): Yes    Lack of Transportation (Non-Medical): Patient refused  Physical Activity: Not on file  Stress: Not on file  Social Connections: Not on file  Intimate Partner Violence: Not on file    Family History  Problem Relation Age of Onset   Cancer Mother        breast   Hypertension Mother    Breast cancer Mother 99   Heart disease Father    Hyperlipidemia Brother    Heart disease Brother      Current Outpatient Medications:    dexamethasone (DECADRON) 4 MG tablet, Take 1 tablet (4 mg total) by mouth 2 (two) times daily with a meal., Disp: 60 tablet, Rfl: 1   ELIQUIS 5 MG TABS tablet, TAKE 1 TABLET(5 MG) BY MOUTH TWICE DAILY, Disp: 60 tablet, Rfl: 1   furosemide (LASIX) 20 MG tablet, Take 1 tablet (20 mg total) by mouth daily., Disp: 60 tablet, Rfl: 0   lidocaine-prilocaine (EMLA) cream, Apply 1 application. topically as needed. Apply small amount to port site at least  1 hour prior to it being accessed, cover with plastic wrap, Disp: 30 g, Rfl: 1   losartan (COZAAR) 25 MG tablet, Take 25 mg by mouth daily., Disp: , Rfl:    metFORMIN (GLUCOPHAGE-XR) 500 MG 24 hr tablet, Take 500 mg by mouth daily with breakfast., Disp: , Rfl:    mirtazapine (REMERON) 15 MG tablet, Take 15 mg by mouth at bedtime., Disp: , Rfl:    oxycodone (ROXICODONE) 30 MG immediate release tablet, Take 1 tablet (30 mg total) by mouth every 4 (four) hours as needed for pain., Disp: 180 tablet, Rfl: 0   potassium chloride SA (KLOR-CON M) 20 MEQ tablet, Take 2 tablets (40 mEq total) by mouth daily., Disp: 7 tablet, Rfl: 0   traZODone (DESYREL) 50 MG tablet, Take by mouth., Disp: , Rfl:    fluticasone (FLONASE) 50 MCG/ACT nasal spray, Place 2 sprays into both nostrils daily. (Patient not taking: Reported on 08/30/2022), Disp: 16 g, Rfl: 5   lansoprazole (PREVACID) 30 MG capsule, Take 1  capsule (30 mg total) by mouth daily. (Patient not taking: Reported on 08/30/2022), Disp: 30 capsule, Rfl: 1   naloxone (NARCAN) nasal spray 4 mg/0.1 mL, , Disp: , Rfl:    polyethylene glycol (MIRALAX / GLYCOLAX) 17 g packet, Take 17 g by mouth daily. (Patient not taking: Reported on 07/19/2022), Disp: , Rfl:  No current facility-administered medications for this visit.  Facility-Administered Medications Ordered in Other Visits:    sodium chloride flush (NS) 0.9 % injection 10 mL, 10 mL, Intravenous, PRN, Sindy Guadeloupe, MD, 10 mL at 12/15/20 0850   sodium chloride flush (NS) 0.9 % injection 10 mL, 10 mL, Intravenous, PRN, Sindy Guadeloupe, MD, 10 mL at 09/20/22 0855  Physical exam:  Vitals:   09/20/22 0900  BP: 138/71  Pulse: (!) 54  Resp: 16  Temp: (!) 97.5 F (36.4 C)  TempSrc: Tympanic  Weight: 165 lb 4.8 oz (75 kg)   Physical Exam Constitutional:      General: She is not in acute distress. Cardiovascular:     Rate and Rhythm: Normal rate and regular rhythm.     Heart sounds: Normal heart sounds.  Pulmonary:     Effort: Pulmonary effort is normal.     Breath sounds: Normal breath sounds.  Abdominal:     General: Bowel sounds are normal.     Palpations: Abdomen is soft.  Lymphadenopathy:     Comments: Left supraclavicular lymph node appears smaller on palpation  Skin:    General: Skin is warm and dry.  Neurological:     Mental Status: She is alert and oriented to person, place, and time.         Latest Ref Rng & Units 09/20/2022    8:55 AM  CMP  Glucose 70 - 99 mg/dL 95   BUN 8 - 23 mg/dL 10   Creatinine 0.44 - 1.00 mg/dL 0.51   Sodium 135 - 145 mmol/L 138   Potassium 3.5 - 5.1 mmol/L 3.9   Chloride 98 - 111 mmol/L 107   CO2 22 - 32 mmol/L 24   Calcium 8.9 - 10.3 mg/dL 9.8   Total Protein 6.5 - 8.1 g/dL 7.3   Total Bilirubin 0.3 - 1.2 mg/dL 0.5   Alkaline Phos 38 - 126 U/L 90   AST 15 - 41 U/L 15   ALT 0 - 44 U/L 9       Latest Ref Rng & Units 09/20/2022     8:55  AM  CBC  WBC 4.0 - 10.5 K/uL 3.0   Hemoglobin 12.0 - 15.0 g/dL 11.5   Hematocrit 36.0 - 46.0 % 34.8   Platelets 150 - 400 K/uL 287      Assessment and plan- Patient is a 71 y.o. female with extensive stage small cell lung cancer here for on treatment assessment prior to cycle 3 of lurbinectedin  Counts okay to proceed with cycle 3 of lurbinectedin today with Udenyca on day 3.  I will see her back in 3 weeks for cycle 4.  Plan to repeat scans after 4 cycles.Overall patient is tolerating treatment well without any significant side effects.  Mild leukopenia but ANC remains more than 1.  Continue to monitor   Visit Diagnosis 1. Small cell carcinoma of lung metastatic to liver ([place])   2. Encounter for antineoplastic chemotherapy      Dr. Randa Evens, MD, MPH [facility] [phone] 09/20/2022 4:40 PM

## 2022-09-20 NOTE — Progress Notes (Signed)
Patient denies new problems/concerns today.   °

## 2022-09-22 ENCOUNTER — Inpatient Hospital Stay: Payer: Medicare HMO

## 2022-09-22 DIAGNOSIS — E119 Type 2 diabetes mellitus without complications: Secondary | ICD-10-CM | POA: Diagnosis not present

## 2022-09-22 DIAGNOSIS — Z5189 Encounter for other specified aftercare: Secondary | ICD-10-CM | POA: Diagnosis not present

## 2022-09-22 DIAGNOSIS — D701 Agranulocytosis secondary to cancer chemotherapy: Secondary | ICD-10-CM | POA: Diagnosis not present

## 2022-09-22 DIAGNOSIS — R5383 Other fatigue: Secondary | ICD-10-CM | POA: Diagnosis not present

## 2022-09-22 DIAGNOSIS — I10 Essential (primary) hypertension: Secondary | ICD-10-CM | POA: Diagnosis not present

## 2022-09-22 DIAGNOSIS — C3490 Malignant neoplasm of unspecified part of unspecified bronchus or lung: Secondary | ICD-10-CM

## 2022-09-22 DIAGNOSIS — C3412 Malignant neoplasm of upper lobe, left bronchus or lung: Secondary | ICD-10-CM | POA: Diagnosis not present

## 2022-09-22 DIAGNOSIS — Z5111 Encounter for antineoplastic chemotherapy: Secondary | ICD-10-CM | POA: Diagnosis not present

## 2022-09-22 DIAGNOSIS — C7931 Secondary malignant neoplasm of brain: Secondary | ICD-10-CM | POA: Diagnosis not present

## 2022-09-22 DIAGNOSIS — C787 Secondary malignant neoplasm of liver and intrahepatic bile duct: Secondary | ICD-10-CM | POA: Diagnosis not present

## 2022-09-22 MED ORDER — PEGFILGRASTIM-CBQV 6 MG/0.6ML ~~LOC~~ SOSY
6.0000 mg | PREFILLED_SYRINGE | Freq: Once | SUBCUTANEOUS | Status: AC
  Administered 2022-09-22: 6 mg via SUBCUTANEOUS
  Filled 2022-09-22: qty 0.6

## 2022-09-23 DIAGNOSIS — C787 Secondary malignant neoplasm of liver and intrahepatic bile duct: Secondary | ICD-10-CM | POA: Diagnosis not present

## 2022-09-23 DIAGNOSIS — Z23 Encounter for immunization: Secondary | ICD-10-CM | POA: Diagnosis not present

## 2022-09-23 DIAGNOSIS — K5900 Constipation, unspecified: Secondary | ICD-10-CM | POA: Diagnosis not present

## 2022-09-23 DIAGNOSIS — I10 Essential (primary) hypertension: Secondary | ICD-10-CM | POA: Diagnosis not present

## 2022-09-23 DIAGNOSIS — C3490 Malignant neoplasm of unspecified part of unspecified bronchus or lung: Secondary | ICD-10-CM | POA: Diagnosis not present

## 2022-09-23 DIAGNOSIS — E119 Type 2 diabetes mellitus without complications: Secondary | ICD-10-CM | POA: Diagnosis not present

## 2022-09-23 DIAGNOSIS — Z0000 Encounter for general adult medical examination without abnormal findings: Secondary | ICD-10-CM | POA: Diagnosis not present

## 2022-09-28 ENCOUNTER — Ambulatory Visit: Payer: Medicare HMO

## 2022-10-10 MED FILL — Dexamethasone Sodium Phosphate Inj 100 MG/10ML: INTRAMUSCULAR | Qty: 1 | Status: AC

## 2022-10-11 ENCOUNTER — Encounter: Payer: Self-pay | Admitting: Oncology

## 2022-10-11 ENCOUNTER — Ambulatory Visit: Payer: Medicare HMO

## 2022-10-11 ENCOUNTER — Other Ambulatory Visit: Payer: Medicare HMO

## 2022-10-11 ENCOUNTER — Inpatient Hospital Stay: Payer: Medicare HMO

## 2022-10-11 ENCOUNTER — Ambulatory Visit: Payer: Medicare HMO | Admitting: Oncology

## 2022-10-11 ENCOUNTER — Inpatient Hospital Stay (HOSPITAL_BASED_OUTPATIENT_CLINIC_OR_DEPARTMENT_OTHER): Payer: Medicare HMO | Admitting: Oncology

## 2022-10-11 VITALS — BP 122/69 | HR 77 | Temp 97.2°F | Resp 16 | Wt 165.0 lb

## 2022-10-11 DIAGNOSIS — I10 Essential (primary) hypertension: Secondary | ICD-10-CM | POA: Diagnosis not present

## 2022-10-11 DIAGNOSIS — R5383 Other fatigue: Secondary | ICD-10-CM | POA: Diagnosis not present

## 2022-10-11 DIAGNOSIS — T451X5A Adverse effect of antineoplastic and immunosuppressive drugs, initial encounter: Secondary | ICD-10-CM

## 2022-10-11 DIAGNOSIS — Z5111 Encounter for antineoplastic chemotherapy: Secondary | ICD-10-CM | POA: Diagnosis not present

## 2022-10-11 DIAGNOSIS — C3490 Malignant neoplasm of unspecified part of unspecified bronchus or lung: Secondary | ICD-10-CM

## 2022-10-11 DIAGNOSIS — C787 Secondary malignant neoplasm of liver and intrahepatic bile duct: Secondary | ICD-10-CM | POA: Diagnosis not present

## 2022-10-11 DIAGNOSIS — E119 Type 2 diabetes mellitus without complications: Secondary | ICD-10-CM | POA: Diagnosis not present

## 2022-10-11 DIAGNOSIS — D701 Agranulocytosis secondary to cancer chemotherapy: Secondary | ICD-10-CM | POA: Diagnosis not present

## 2022-10-11 DIAGNOSIS — Z5189 Encounter for other specified aftercare: Secondary | ICD-10-CM | POA: Diagnosis not present

## 2022-10-11 DIAGNOSIS — C3412 Malignant neoplasm of upper lobe, left bronchus or lung: Secondary | ICD-10-CM | POA: Diagnosis not present

## 2022-10-11 DIAGNOSIS — C7931 Secondary malignant neoplasm of brain: Secondary | ICD-10-CM | POA: Diagnosis not present

## 2022-10-11 LAB — CBC WITH DIFFERENTIAL/PLATELET
Abs Immature Granulocytes: 0.01 10*3/uL (ref 0.00–0.07)
Basophils Absolute: 0 10*3/uL (ref 0.0–0.1)
Basophils Relative: 0 %
Eosinophils Absolute: 0.1 10*3/uL (ref 0.0–0.5)
Eosinophils Relative: 2 %
HCT: 32.3 % — ABNORMAL LOW (ref 36.0–46.0)
Hemoglobin: 10.7 g/dL — ABNORMAL LOW (ref 12.0–15.0)
Immature Granulocytes: 0 %
Lymphocytes Relative: 24 %
Lymphs Abs: 0.8 10*3/uL (ref 0.7–4.0)
MCH: 31.2 pg (ref 26.0–34.0)
MCHC: 33.1 g/dL (ref 30.0–36.0)
MCV: 94.2 fL (ref 80.0–100.0)
Monocytes Absolute: 0.6 10*3/uL (ref 0.1–1.0)
Monocytes Relative: 17 %
Neutro Abs: 2 10*3/uL (ref 1.7–7.7)
Neutrophils Relative %: 57 %
Platelets: 283 10*3/uL (ref 150–400)
RBC: 3.43 MIL/uL — ABNORMAL LOW (ref 3.87–5.11)
RDW: 18.1 % — ABNORMAL HIGH (ref 11.5–15.5)
WBC: 3.4 10*3/uL — ABNORMAL LOW (ref 4.0–10.5)
nRBC: 0 % (ref 0.0–0.2)

## 2022-10-11 LAB — COMPREHENSIVE METABOLIC PANEL
ALT: 10 U/L (ref 0–44)
AST: 15 U/L (ref 15–41)
Albumin: 3.8 g/dL (ref 3.5–5.0)
Alkaline Phosphatase: 91 U/L (ref 38–126)
Anion gap: 5 (ref 5–15)
BUN: 10 mg/dL (ref 8–23)
CO2: 23 mmol/L (ref 22–32)
Calcium: 9.4 mg/dL (ref 8.9–10.3)
Chloride: 109 mmol/L (ref 98–111)
Creatinine, Ser: 0.47 mg/dL (ref 0.44–1.00)
GFR, Estimated: >60 mL/min (ref >60)
Glucose, Bld: 104 mg/dL — ABNORMAL HIGH (ref 70–99)
Potassium: 3.6 mmol/L (ref 3.5–5.1)
Sodium: 137 mmol/L (ref 135–145)
Total Bilirubin: 0.2 mg/dL — ABNORMAL LOW (ref 0.3–1.2)
Total Protein: 6.8 g/dL (ref 6.5–8.1)

## 2022-10-11 MED ORDER — HEPARIN SOD (PORK) LOCK FLUSH 100 UNIT/ML IV SOLN
500.0000 [IU] | Freq: Once | INTRAVENOUS | Status: AC | PRN
  Administered 2022-10-11: 500 [IU]
  Filled 2022-10-11: qty 5

## 2022-10-11 MED ORDER — SODIUM CHLORIDE 0.9 % IV SOLN
10.0000 mg | Freq: Once | INTRAVENOUS | Status: AC
  Administered 2022-10-11: 10 mg via INTRAVENOUS
  Filled 2022-10-11: qty 10

## 2022-10-11 MED ORDER — SODIUM CHLORIDE 0.9 % IV SOLN
Freq: Once | INTRAVENOUS | Status: AC
  Filled 2022-10-11: qty 250

## 2022-10-11 MED ORDER — PALONOSETRON HCL INJECTION 0.25 MG/5ML
0.2500 mg | Freq: Once | INTRAVENOUS | Status: AC
  Administered 2022-10-11: 0.25 mg via INTRAVENOUS
  Filled 2022-10-11: qty 5

## 2022-10-11 MED ORDER — SODIUM CHLORIDE 0.9 % IV SOLN
3.2000 mg/m2 | Freq: Once | INTRAVENOUS | Status: AC
  Administered 2022-10-11: 5.75 mg via INTRAVENOUS
  Filled 2022-10-11: qty 11.5

## 2022-10-11 NOTE — Patient Instructions (Signed)

## 2022-10-11 NOTE — Progress Notes (Signed)
Hematology/Oncology Consult note [facility]  [phone] [phone]  Patient Care Team: Leonel Ramsay, MD as PCP - General (Infectious Diseases) Telford Nab, RN as Oncology Nurse Navigator Sindy Guadeloupe, MD as Consulting Physician (Hematology and Oncology)   Name of the patient: Doris Johnson  [id number]  [birth year]   Date of visit: 10/11/22  Diagnosis- extensive stage small cell lung cancer with liver lymph node and brain metastases   Chief complaint/ Reason for visit-on treatment assessment prior to cycle 4 of lurbinectedin  Heme/Onc history:  Patient is a 71 year old female with a remote history of smoking in her teenage years and exposure to passive smoking.  She has been having ongoing midsternal pain which has been growing since the last 6 to 7 months.  She had been to urgent care as well in the past.  She finally had a CT chest with contrast done in October 2021 which showed a large mass in the left side of the mediastinum measuring 6.7 x 6.2 x 6.1 cm causing severe compression of the left main pulmonary artery and around the aortic arch in the left subclavian artery.  Enlarged thyroid gland.  Left upper lobe nodule 1 x 0.7 cm.  She then underwent bronchoscopy with Dr.Aleskerov left upper lobe biopsy was nondiagnostic.  However station 4, 7 and 10 L lymph nodes were consistent with metastatic small cell carcinoma.   PET CT scan showed enlarged level 3 cervical lymph node 2.2 cm that was hypermetabolic with an SUV of 9.7.  Large central 7.3 cm AP window mass.  5.7 cm left liver mass.  MRI brain with and without contrast showed 2 small foci of enhancement in the right cerebellar hemisphere and right temporal lobe without mass-effect or edema as well concerning for brain metastases   Patient had 2 cycles of carbo etoposide chemotherapy along with Tecentriq and subsequent scan showed no significant improvement in the primary lung  mass or liver mass.  She received radiation treatment to her primary lung mass and also seen by [facility] for second opinion.  Her pathology was reviewed at [facility] and reported as possible neuroendocrine neoplasm But stated that small cell carcinoma cannot be excluded but not definitely diagnostic for it.  However [facility] pathology feels that this is consistent with small cell lung cancer.  She has completed 4 cycles of carbo etoposide Tecentriq chemotherapy and is currently on maintenance Tecentriq.  Repeat liver biopsy was also performed and was consistent with small cell lung cancer   Disease progression after 23 cycles of maintenance Tecentriq in the left supraclavicular lymph node region.  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  Plan to switch patient to second line lurbinectedin    Interval history-tolerating treatments well so far.  Denies any cough or shortness of breath.  Denies any nausea vomiting or diarrhea.  Appetite and weight have remained stable.  ECOG PS- 1 Pain scale- 0 Opioid associated constipation- no  Review of systems- Review of Systems  Constitutional:  Positive for malaise/fatigue. Negative for chills, fever and weight loss.  HENT:  Negative for congestion, ear discharge and nosebleeds.   Eyes:  Negative for blurred vision.  Respiratory:  Negative for cough, hemoptysis, sputum production, shortness of breath and wheezing.   Cardiovascular:  Negative for chest pain, palpitations, orthopnea and claudication.  Gastrointestinal:  Negative for abdominal pain, blood in stool, constipation, diarrhea, heartburn, melena, nausea and vomiting.  Genitourinary:  Negative for dysuria, flank pain, frequency,  hematuria and urgency.  Musculoskeletal:  Negative for back pain, joint pain and myalgias.  Skin:  Negative for rash.  Neurological:  Negative for dizziness, tingling, focal weakness, seizures, weakness and headaches.  Endo/Heme/Allergies:  Does not  bruise/bleed easily.  Psychiatric/Behavioral:  Negative for depression and suicidal ideas. The patient does not have insomnia.       No Known Allergies   Past Medical History:  Diagnosis Date   Cancer ([place])    Cataract    Diabetes mellitus without complication ([place])    Hyperlipidemia    Hypertension    Hypothyroidism    doctor's keeping an eye on thyroid    Lung cancer [facility])      Past Surgical History:  Procedure Laterality Date   ABDOMINAL HYSTERECTOMY     IR CV LINE INJECTION  09/28/2021   PORTA CATH INSERTION N/A 11/30/2020   Procedure: PORTA CATH INSERTION;  Surgeon: Algernon Huxley, MD;  Location: [facility];  Service: Cardiovascular;  Laterality: N/A;   VIDEO BRONCHOSCOPY WITH ENDOBRONCHIAL NAVIGATION N/A 10/21/2020   Procedure: VIDEO BRONCHOSCOPY WITH ENDOBRONCHIAL NAVIGATION;  Surgeon: Ottie Glazier, MD;  Location: [facility] ORS;  Service: Thoracic;  Laterality: N/A;   VIDEO BRONCHOSCOPY WITH ENDOBRONCHIAL ULTRASOUND N/A 10/21/2020   Procedure: VIDEO BRONCHOSCOPY WITH ENDOBRONCHIAL ULTRASOUND;  Surgeon: Ottie Glazier, MD;  Location: [facility] ORS;  Service: Thoracic;  Laterality: N/A;    Social History   Socioeconomic History   Marital status: Single    Spouse name: Not on file   Number of children: Not on file   Years of education: Not on file   Highest education level: Not on file  Occupational History   Not on file  Tobacco Use   Smoking status: Never   Smokeless tobacco: Never  Vaping Use   Vaping Use: Never used  Substance and Sexual Activity   Alcohol use: No   Drug use: No   Sexual activity: Not Currently  Other Topics Concern   Not on file  Social History Narrative   Not on file   Social Determinants of Health   Financial Resource Strain: Not on file  Food Insecurity: Food Insecurity Present (07/08/2021)   Hunger Vital Sign    Worried About Running Out of Food in the Last Year: Sometimes true    Ran Out of Food in the Last Year: Sometimes  true  Transportation Needs: Unmet Transportation Needs (10/11/2022)   PRAPARE - Hydrologist (Medical): Yes    Lack of Transportation (Non-Medical): Yes  Physical Activity: Not on file  Stress: Not on file  Social Connections: Not on file  Intimate Partner Violence: Not on file    Family History  Problem Relation Age of Onset   Cancer Mother        breast   Hypertension Mother    Breast cancer Mother 58   Heart disease Father    Hyperlipidemia Brother    Heart disease Brother      Current Outpatient Medications:    dexamethasone (DECADRON) 4 MG tablet, Take 1 tablet (4 mg total) by mouth 2 (two) times daily with a meal., Disp: 60 tablet, Rfl: 1   ELIQUIS 5 MG TABS tablet, TAKE 1 TABLET(5 MG) BY MOUTH TWICE DAILY, Disp: 60 tablet, Rfl: 1   fluticasone (FLONASE) 50 MCG/ACT nasal spray, Place 2 sprays into both nostrils daily., Disp: 16 g, Rfl: 5   furosemide (LASIX) 20 MG tablet, Take 1 tablet (20 mg total) by mouth  daily., Disp: 60 tablet, Rfl: 0   lansoprazole (PREVACID) 30 MG capsule, Take 1 capsule (30 mg total) by mouth daily., Disp: 30 capsule, Rfl: 1   lidocaine-prilocaine (EMLA) cream, Apply 1 application. topically as needed. Apply small amount to port site at least 1 hour prior to it being accessed, cover with plastic wrap, Disp: 30 g, Rfl: 1   losartan (COZAAR) 25 MG tablet, Take 25 mg by mouth daily., Disp: , Rfl:    metFORMIN (GLUCOPHAGE-XR) 500 MG 24 hr tablet, Take 500 mg by mouth daily with breakfast., Disp: , Rfl:    mirtazapine (REMERON) 15 MG tablet, Take 15 mg by mouth at bedtime., Disp: , Rfl:    potassium chloride SA (KLOR-CON M) 20 MEQ tablet, Take 2 tablets (40 mEq total) by mouth daily., Disp: 7 tablet, Rfl: 0   naloxone (NARCAN) nasal spray 4 mg/0.1 mL, , Disp: , Rfl:    oxycodone (ROXICODONE) 30 MG immediate release tablet, Take 1 tablet (30 mg total) by mouth every 4 (four) hours as needed for pain. (Patient not taking: Reported  on 10/11/2022), Disp: 180 tablet, Rfl: 0   polyethylene glycol (MIRALAX / GLYCOLAX) 17 g packet, Take 17 g by mouth daily. (Patient not taking: Reported on 07/19/2022), Disp: , Rfl:    traZODone (DESYREL) 50 MG tablet, Take by mouth. (Patient not taking: Reported on 10/11/2022), Disp: , Rfl:  No current facility-administered medications for this visit.  Facility-Administered Medications Ordered in Other Visits:    sodium chloride flush (NS) 0.9 % injection 10 mL, 10 mL, Intravenous, PRN, Sindy Guadeloupe, MD, 10 mL at 12/15/20 0850  Physical exam:  Vitals:   10/11/22 1321  BP: 122/69  Pulse: 77  Resp: 16  Temp: (!) 97.2 F (36.2 C)  TempSrc: Tympanic  SpO2: 100%  Weight: 165 lb (74.8 kg)   Physical Exam Cardiovascular:     Rate and Rhythm: Normal rate and regular rhythm.     Heart sounds: Normal heart sounds.  Pulmonary:     Effort: Pulmonary effort is normal.     Breath sounds: Normal breath sounds.  Abdominal:     General: Bowel sounds are normal.     Palpations: Abdomen is soft.  Lymphadenopathy:     Comments: Palpable left supraclavicular lymph node unchanged  Skin:    General: Skin is warm and dry.  Neurological:     Mental Status: She is alert and oriented to person, place, and time.         Latest Ref Rng & Units 10/11/2022   12:40 PM  CMP  Glucose 70 - 99 mg/dL 104   BUN 8 - 23 mg/dL 10   Creatinine 0.44 - 1.00 mg/dL 0.47   Sodium 135 - 145 mmol/L 137   Potassium 3.5 - 5.1 mmol/L 3.6   Chloride 98 - 111 mmol/L 109   CO2 22 - 32 mmol/L 23   Calcium 8.9 - 10.3 mg/dL 9.4   Total Protein 6.5 - 8.1 g/dL 6.8   Total Bilirubin 0.3 - 1.2 mg/dL 0.2   Alkaline Phos 38 - 126 U/L 91   AST 15 - 41 U/L 15   ALT 0 - 44 U/L 10       Latest Ref Rng & Units 10/11/2022   12:40 PM  CBC  WBC 4.0 - 10.5 K/uL 3.4   Hemoglobin 12.0 - 15.0 g/dL 10.7   Hematocrit 36.0 - 46.0 % 32.3   Platelets 150 - 400 K/uL 283  No images are attached to the encounter.  No results  found.   Assessment and plan- Patient is a 71 y.o. female with extensive stage small cell lung cancer with brain and liver as well as lymph node metastases here for on treatment assessment prior to cycle 4 of lurbinectedin  Counts okay to proceed with cycle 4 of lurbinectedin today.  She has a chronic leukopenia but her ANC remains more than 1.  She will receive the Udenyca with each cycle of treatment.  I will see her back in 3 weeks for cycle 5.  She is scheduled for repeat scans next week.   Visit Diagnosis 1. Encounter for antineoplastic chemotherapy   2. Small cell carcinoma of lung metastatic to liver ([place])   3. Chemotherapy induced neutropenia ([facility])      Dr. Randa Evens, MD, MPH [facility] [phone] 10/11/2022 6:32 PM

## 2022-10-11 NOTE — Progress Notes (Signed)
Pt in for follow up, pt denies any concerns or difficulties today.

## 2022-10-13 ENCOUNTER — Inpatient Hospital Stay: Payer: Medicare HMO

## 2022-10-13 DIAGNOSIS — R5383 Other fatigue: Secondary | ICD-10-CM | POA: Diagnosis not present

## 2022-10-13 DIAGNOSIS — C787 Secondary malignant neoplasm of liver and intrahepatic bile duct: Secondary | ICD-10-CM | POA: Diagnosis not present

## 2022-10-13 DIAGNOSIS — Z5189 Encounter for other specified aftercare: Secondary | ICD-10-CM | POA: Diagnosis not present

## 2022-10-13 DIAGNOSIS — I10 Essential (primary) hypertension: Secondary | ICD-10-CM | POA: Diagnosis not present

## 2022-10-13 DIAGNOSIS — D701 Agranulocytosis secondary to cancer chemotherapy: Secondary | ICD-10-CM | POA: Diagnosis not present

## 2022-10-13 DIAGNOSIS — C3412 Malignant neoplasm of upper lobe, left bronchus or lung: Secondary | ICD-10-CM | POA: Diagnosis not present

## 2022-10-13 DIAGNOSIS — C3490 Malignant neoplasm of unspecified part of unspecified bronchus or lung: Secondary | ICD-10-CM

## 2022-10-13 DIAGNOSIS — C7931 Secondary malignant neoplasm of brain: Secondary | ICD-10-CM | POA: Diagnosis not present

## 2022-10-13 DIAGNOSIS — Z5111 Encounter for antineoplastic chemotherapy: Secondary | ICD-10-CM | POA: Diagnosis not present

## 2022-10-13 DIAGNOSIS — E119 Type 2 diabetes mellitus without complications: Secondary | ICD-10-CM | POA: Diagnosis not present

## 2022-10-13 MED ORDER — PEGFILGRASTIM-CBQV 6 MG/0.6ML ~~LOC~~ SOSY
6.0000 mg | PREFILLED_SYRINGE | Freq: Once | SUBCUTANEOUS | Status: AC
  Administered 2022-10-13: 6 mg via SUBCUTANEOUS
  Filled 2022-10-13: qty 0.6

## 2022-10-18 ENCOUNTER — Ambulatory Visit
Admission: RE | Admit: 2022-10-18 | Discharge: 2022-10-18 | Disposition: A | Discharge status: Home / Self Care | Payer: Medicare HMO | Source: Ambulatory Visit | Attending: Oncology | Admitting: Oncology

## 2022-10-18 ENCOUNTER — Inpatient Hospital Stay: Payer: Medicare HMO | Attending: Oncology

## 2022-10-18 DIAGNOSIS — Z7952 Long term (current) use of systemic steroids: Secondary | ICD-10-CM | POA: Insufficient documentation

## 2022-10-18 DIAGNOSIS — C3412 Malignant neoplasm of upper lobe, left bronchus or lung: Secondary | ICD-10-CM | POA: Insufficient documentation

## 2022-10-18 DIAGNOSIS — C7931 Secondary malignant neoplasm of brain: Secondary | ICD-10-CM | POA: Insufficient documentation

## 2022-10-18 DIAGNOSIS — Z7901 Long term (current) use of anticoagulants: Secondary | ICD-10-CM | POA: Insufficient documentation

## 2022-10-18 DIAGNOSIS — C787 Secondary malignant neoplasm of liver and intrahepatic bile duct: Secondary | ICD-10-CM | POA: Insufficient documentation

## 2022-10-18 DIAGNOSIS — D701 Agranulocytosis secondary to cancer chemotherapy: Secondary | ICD-10-CM | POA: Insufficient documentation

## 2022-10-18 DIAGNOSIS — C770 Secondary and unspecified malignant neoplasm of lymph nodes of head, face and neck: Secondary | ICD-10-CM | POA: Insufficient documentation

## 2022-10-18 DIAGNOSIS — Z7984 Long term (current) use of oral hypoglycemic drugs: Secondary | ICD-10-CM | POA: Insufficient documentation

## 2022-10-18 DIAGNOSIS — Z79899 Other long term (current) drug therapy: Secondary | ICD-10-CM | POA: Insufficient documentation

## 2022-10-18 DIAGNOSIS — C3490 Malignant neoplasm of unspecified part of unspecified bronchus or lung: Secondary | ICD-10-CM | POA: Insufficient documentation

## 2022-10-18 DIAGNOSIS — Z5111 Encounter for antineoplastic chemotherapy: Secondary | ICD-10-CM | POA: Insufficient documentation

## 2022-10-18 DIAGNOSIS — Z5189 Encounter for other specified aftercare: Secondary | ICD-10-CM | POA: Insufficient documentation

## 2022-10-18 DIAGNOSIS — R911 Solitary pulmonary nodule: Secondary | ICD-10-CM | POA: Diagnosis not present

## 2022-10-18 DIAGNOSIS — Z86718 Personal history of other venous thrombosis and embolism: Secondary | ICD-10-CM | POA: Insufficient documentation

## 2022-10-18 DIAGNOSIS — K7689 Other specified diseases of liver: Secondary | ICD-10-CM | POA: Diagnosis not present

## 2022-10-18 DIAGNOSIS — E049 Nontoxic goiter, unspecified: Secondary | ICD-10-CM | POA: Insufficient documentation

## 2022-10-18 DIAGNOSIS — T451X5A Adverse effect of antineoplastic and immunosuppressive drugs, initial encounter: Secondary | ICD-10-CM | POA: Insufficient documentation

## 2022-10-18 MED ORDER — IOHEXOL 300 MG/ML  SOLN
100.0000 mL | Freq: Once | INTRAMUSCULAR | Status: AC | PRN
  Administered 2022-10-18: 100 mL via INTRAVENOUS

## 2022-10-31 MED FILL — Dexamethasone Sodium Phosphate Inj 100 MG/10ML: INTRAMUSCULAR | Qty: 1 | Status: AC

## 2022-11-01 ENCOUNTER — Inpatient Hospital Stay: Payer: Medicare HMO

## 2022-11-01 ENCOUNTER — Encounter: Payer: Self-pay | Admitting: Oncology

## 2022-11-01 ENCOUNTER — Inpatient Hospital Stay (HOSPITAL_BASED_OUTPATIENT_CLINIC_OR_DEPARTMENT_OTHER): Payer: Medicare HMO | Admitting: Oncology

## 2022-11-01 VITALS — BP 151/74 | HR 70 | Temp 96.7°F | Resp 16 | Ht 66.0 in | Wt 166.9 lb

## 2022-11-01 DIAGNOSIS — C3490 Malignant neoplasm of unspecified part of unspecified bronchus or lung: Secondary | ICD-10-CM | POA: Diagnosis not present

## 2022-11-01 DIAGNOSIS — C3412 Malignant neoplasm of upper lobe, left bronchus or lung: Secondary | ICD-10-CM | POA: Diagnosis not present

## 2022-11-01 DIAGNOSIS — Z7984 Long term (current) use of oral hypoglycemic drugs: Secondary | ICD-10-CM | POA: Diagnosis not present

## 2022-11-01 DIAGNOSIS — C787 Secondary malignant neoplasm of liver and intrahepatic bile duct: Secondary | ICD-10-CM

## 2022-11-01 DIAGNOSIS — Z7952 Long term (current) use of systemic steroids: Secondary | ICD-10-CM | POA: Diagnosis not present

## 2022-11-01 DIAGNOSIS — T451X5A Adverse effect of antineoplastic and immunosuppressive drugs, initial encounter: Secondary | ICD-10-CM | POA: Diagnosis not present

## 2022-11-01 DIAGNOSIS — C770 Secondary and unspecified malignant neoplasm of lymph nodes of head, face and neck: Secondary | ICD-10-CM | POA: Diagnosis not present

## 2022-11-01 DIAGNOSIS — Z5189 Encounter for other specified aftercare: Secondary | ICD-10-CM | POA: Diagnosis not present

## 2022-11-01 DIAGNOSIS — D701 Agranulocytosis secondary to cancer chemotherapy: Secondary | ICD-10-CM

## 2022-11-01 DIAGNOSIS — Z5111 Encounter for antineoplastic chemotherapy: Secondary | ICD-10-CM

## 2022-11-01 DIAGNOSIS — E049 Nontoxic goiter, unspecified: Secondary | ICD-10-CM | POA: Diagnosis not present

## 2022-11-01 DIAGNOSIS — Z79899 Other long term (current) drug therapy: Secondary | ICD-10-CM | POA: Diagnosis not present

## 2022-11-01 DIAGNOSIS — Z7901 Long term (current) use of anticoagulants: Secondary | ICD-10-CM | POA: Diagnosis not present

## 2022-11-01 DIAGNOSIS — C7931 Secondary malignant neoplasm of brain: Secondary | ICD-10-CM | POA: Diagnosis not present

## 2022-11-01 DIAGNOSIS — Z86718 Personal history of other venous thrombosis and embolism: Secondary | ICD-10-CM | POA: Diagnosis not present

## 2022-11-01 LAB — CBC WITH DIFFERENTIAL/PLATELET
Abs Immature Granulocytes: 0 10*3/uL (ref 0.00–0.07)
Basophils Absolute: 0 10*3/uL (ref 0.0–0.1)
Basophils Relative: 0 %
Eosinophils Absolute: 0 10*3/uL (ref 0.0–0.5)
Eosinophils Relative: 1 %
HCT: 33.5 % — ABNORMAL LOW (ref 36.0–46.0)
Hemoglobin: 11.1 g/dL — ABNORMAL LOW (ref 12.0–15.0)
Immature Granulocytes: 0 %
Lymphocytes Relative: 21 %
Lymphs Abs: 0.5 10*3/uL — ABNORMAL LOW (ref 0.7–4.0)
MCH: 31.4 pg (ref 26.0–34.0)
MCHC: 33.1 g/dL (ref 30.0–36.0)
MCV: 94.6 fL (ref 80.0–100.0)
Monocytes Absolute: 0.5 10*3/uL (ref 0.1–1.0)
Monocytes Relative: 20 %
Neutro Abs: 1.3 10*3/uL — ABNORMAL LOW (ref 1.7–7.7)
Neutrophils Relative %: 58 %
Platelets: 285 10*3/uL (ref 150–400)
RBC: 3.54 MIL/uL — ABNORMAL LOW (ref 3.87–5.11)
RDW: 17.2 % — ABNORMAL HIGH (ref 11.5–15.5)
WBC: 2.3 10*3/uL — ABNORMAL LOW (ref 4.0–10.5)
nRBC: 0 % (ref 0.0–0.2)

## 2022-11-01 LAB — COMPREHENSIVE METABOLIC PANEL
ALT: 9 U/L (ref 0–44)
AST: 14 U/L — ABNORMAL LOW (ref 15–41)
Albumin: 4.1 g/dL (ref 3.5–5.0)
Alkaline Phosphatase: 74 U/L (ref 38–126)
Anion gap: 6 (ref 5–15)
BUN: 15 mg/dL (ref 8–23)
CO2: 23 mmol/L (ref 22–32)
Calcium: 9.4 mg/dL (ref 8.9–10.3)
Chloride: 111 mmol/L (ref 98–111)
Creatinine, Ser: 0.52 mg/dL (ref 0.44–1.00)
GFR, Estimated: >60 mL/min (ref >60)
Glucose, Bld: 86 mg/dL (ref 70–99)
Potassium: 3.7 mmol/L (ref 3.5–5.1)
Sodium: 140 mmol/L (ref 135–145)
Total Bilirubin: 0.6 mg/dL (ref 0.3–1.2)
Total Protein: 7.4 g/dL (ref 6.5–8.1)

## 2022-11-01 LAB — FOLATE: Folate: 8 ng/mL (ref >5.9)

## 2022-11-01 LAB — IRON AND TIBC
Iron: 67 ug/dL (ref 28–170)
Saturation Ratios: 16 % (ref 10.4–31.8)
TIBC: 412 ug/dL (ref 250–450)
UIBC: 345 ug/dL

## 2022-11-01 LAB — FERRITIN: Ferritin: 72 ng/mL (ref 11–307)

## 2022-11-01 MED ORDER — SODIUM CHLORIDE 0.9 % IV SOLN
10.0000 mg | Freq: Once | INTRAVENOUS | Status: AC
  Administered 2022-11-01: 10 mg via INTRAVENOUS
  Filled 2022-11-01: qty 10

## 2022-11-01 MED ORDER — LIDOCAINE-PRILOCAINE 2.5-2.5 % EX CREA: 1.0000 | TOPICAL_CREAM | CUTANEOUS | 1 refills | Status: DC | PRN

## 2022-11-01 MED ORDER — HEPARIN SOD (PORK) LOCK FLUSH 100 UNIT/ML IV SOLN
500.0000 [IU] | Freq: Once | INTRAVENOUS | Status: AC | PRN
  Administered 2022-11-01: 500 [IU]
  Filled 2022-11-01: qty 5

## 2022-11-01 MED ORDER — SODIUM CHLORIDE 0.9 % IV SOLN
Freq: Once | INTRAVENOUS | Status: AC
  Filled 2022-11-01: qty 250

## 2022-11-01 MED ORDER — PALONOSETRON HCL INJECTION 0.25 MG/5ML
0.2500 mg | Freq: Once | INTRAVENOUS | Status: AC
  Administered 2022-11-01: 0.25 mg via INTRAVENOUS
  Filled 2022-11-01: qty 5

## 2022-11-01 MED ORDER — SODIUM CHLORIDE 0.9 % IV SOLN
3.2000 mg/m2 | Freq: Once | INTRAVENOUS | Status: AC
  Administered 2022-11-01: 5.75 mg via INTRAVENOUS
  Filled 2022-11-01: qty 11.5

## 2022-11-01 NOTE — Patient Instructions (Signed)
[facility] CANCER CTR AT [place]  Discharge Instructions: Thank you for choosing [facility] to provide your oncology and hematology care.  If you have a lab appointment with the [facility], please go directly to the [facility] and check in at the registration area.  Wear comfortable clothing and clothing appropriate for easy access to any Portacath or PICC line.   We strive to give you quality time with your provider. You may need to reschedule your appointment if you arrive late (15 or more minutes).  Arriving late affects you and other patients whose appointments are after yours.  Also, if you miss three or more appointments without notifying the office, you may be dismissed from the clinic at the provider's discretion.      For prescription refill requests, have your pharmacy contact our office and allow 72 hours for refills to be completed.    Today you received the following chemotherapy and/or immunotherapy agents ZEPZELCA      To help prevent nausea and vomiting after your treatment, we encourage you to take your nausea medication as directed.  BELOW ARE SYMPTOMS THAT SHOULD BE REPORTED IMMEDIATELY: *FEVER GREATER THAN 100.4 F (38 C) OR HIGHER *CHILLS OR SWEATING *NAUSEA AND VOMITING THAT IS NOT CONTROLLED WITH YOUR NAUSEA MEDICATION *UNUSUAL SHORTNESS OF BREATH *UNUSUAL BRUISING OR BLEEDING *URINARY PROBLEMS (pain or burning when urinating, or frequent urination) *BOWEL PROBLEMS (unusual diarrhea, constipation, pain near the anus) TENDERNESS IN MOUTH AND THROAT WITH OR WITHOUT PRESENCE OF ULCERS (sore throat, sores in mouth, or a toothache) UNUSUAL RASH, SWELLING OR PAIN  UNUSUAL VAGINAL DISCHARGE OR ITCHING   Items with * indicate a potential emergency and should be followed up as soon as possible or go to the Emergency Department if any problems should occur.  Please show the CHEMOTHERAPY ALERT CARD or IMMUNOTHERAPY ALERT CARD at check-in to  the Emergency Department and triage nurse.  Should you have questions after your visit or need to cancel or reschedule your appointment, please contact [facility] CANCER [place] AT [place]  [phone] and follow the prompts.  Office hours are 8:00 a.m. to 4:30 p.m. Monday - Friday. Please note that voicemails left after 4:00 p.m. may not be returned until the following business day.  We are closed weekends and major holidays. You have access to a nurse at all times for urgent questions. Please call the main number to the clinic [phone] and follow the prompts.  For any non-urgent questions, you may also contact your provider using MyChart. We now offer e-Visits for anyone 22 and older to request care online for non-urgent symptoms. For details visit [email or web address].   Also download the MyChart app! Go to the app store, search "MyChart", open the app, select [facility], and log in with your MyChart username and password.  Masks are optional in the cancer centers. If you would like for your care team to wear a mask while they are taking care of you, please let them know. For doctor visits, patients may have with them one support person who is at least 72 years old. At this time, visitors are not allowed in the infusion area.  Lurbinectedin Injection What is this medication? LURBINECTEDIN (LOOR bin EK te din) treats lung cancer. It works by slowing down the growth of cancer cells. This medicine may be used for other purposes; ask your health care provider or pharmacist if you have questions. COMMON BRAND NAME(S): ZEPZELCA What should I tell my care team before  I take this medication? They need to know if you have any of these conditions: Liver disease Low blood cell levels, such as low white cells, platelets, red blood cells An unusual or allergic reaction to lurbinectedin, other medications, foods, dyes, or preservatives If you or your partner are pregnant or trying to get  pregnant Breastfeeding How should I use this medication? This medication is injected into a vein. It is given by your care team in a hospital or clinic setting. Talk to your care team about the use of this medication in children. Special care may be needed. Overdosage: If you think you have taken too much of this medicine contact a poison control center or emergency room at once. NOTE: This medicine is only for you. Do not share this medicine with others. What if I miss a dose? Keep appointments for follow-up doses. It is important not to miss your dose. Call your care team if you are unable to keep an appointment. What may interact with this medication? Grapefruit juice or [place] oranges Other medications may affect the way this medication works. Talk with your care team about all of the medications you take. They may suggest changes to your treatment plan to lower the risk of side effects and to make sure your medications work as intended. This list may not describe all possible interactions. Give your health care provider a list of all the medicines, herbs, non-prescription drugs, or dietary supplements you use. Also tell them if you smoke, drink alcohol, or use illegal drugs. Some items may interact with your medicine. What should I watch for while using this medication? Your condition will be monitored carefully while you are receiving this medication. This medication may make you feel generally unwell. This is not uncommon as chemotherapy can affect healthy cells as well as cancer cells. Report any side effects. Continue your course of treatment even though you feel ill unless your care team tells you to stop. This medication may increase your risk of getting an infection. Call your care team for advice if you get a fever, chills, sore throat, or other symptoms of a cold or flu. Do not treat yourself. Try to avoid being around people who are sick. Avoid taking medications that contain  aspirin, acetaminophen, ibuprofen, naproxen, or ketoprofen unless instructed by your care team. These medications may hide a fever. Be careful brushing or flossing your teeth or using a toothpick because you may get an infection or bleed more easily. If you have any dental work done, tell your dentist you are receiving this medication. Talk to your care team if you may be pregnant. Serious birth defects can occur if you take this medication during pregnancy and for 6 months after the last dose. You will need a negative pregnancy test before starting this medication. Contraception is recommended while taking this medication and for 6 months after the last dose. If your partner can get pregnant, use a condom during sex while taking this medication and for 4 months after the last dose. Do not breastfeed while taking this medication and for 2 weeks after the last dose. What side effects may I notice from receiving this medication? Side effects that you should report to your care team as soon as possible: Allergic reactions--skin rash, itching, hives, swelling of the face, lips, tongue, or throat Infection--fever, chills, cough, sore throat, wounds that don't heal, pain or trouble when passing urine, general feeling of discomfort or being unwell Liver injury--right upper belly pain, loss  of appetite, nausea, light-colored stool, dark yellow or brown urine, yellowing skin or eyes, unusual weakness or fatigue Low red blood cell level--unusual weakness or fatigue, dizziness, headache, trouble breathing Muscle injury--unusual weakness or fatigue, muscle pain, dark yellow or brown urine, decrease in amount of urine Painful swelling, warmth, or redness of the skin, blisters or sores at the infusion site Unusual bruising or bleeding Side effects that usually do not require medical attention (report these to your care team if they continue or are bothersome): Constipation Cough Diarrhea Fatigue Loss of  appetite Nausea This list may not describe all possible side effects. Call your doctor for medical advice about side effects. You may report side effects to FDA at 1-800-FDA-1088. Where should I keep my medication? This medication is given in a hospital or clinic. It will not be stored at home. NOTE: This sheet is a summary. It may not cover all possible information. If you have questions about this medicine, talk to your doctor, pharmacist, or health care provider.  2023 Elsevier/Gold Standard (2021-02-24 00:00:00)

## 2022-11-01 NOTE — Progress Notes (Signed)
Patient denies new problems today.    Has not taken BP med this morning.

## 2022-11-01 NOTE — Progress Notes (Signed)
Hematology/Oncology Consult note [facility]  Telephone:([phone] [phone]  Patient Care Team: Leonel Ramsay, MD as PCP - General (Infectious Diseases) Telford Nab, RN as Oncology Nurse Navigator Sindy Guadeloupe, MD as Consulting Physician (Hematology and Oncology)   Name of the patient: Doris Johnson  [id number]  1951-10-20   Date of visit: 11/01/22  Diagnosis-  extensive stage small cell lung cancer with liver lymph node and brain metastases    Chief complaint/ Reason for visit-on treatment assessment prior to cycle 5 of lurbinectedin and discuss CT scan results and further management  Heme/Onc history:  Patient is a 71 year old female with a remote history of smoking in her teenage years and exposure to passive smoking.  She has been having ongoing midsternal pain which has been growing since the last 6 to 7 months.  She had been to urgent care as well in the past.  She finally had a CT chest with contrast done in October 2021 which showed a large mass in the left side of the mediastinum measuring 6.7 x 6.2 x 6.1 cm causing severe compression of the left main pulmonary artery and around the aortic arch in the left subclavian artery.  Enlarged thyroid gland.  Left upper lobe nodule 1 x 0.7 cm.  She then underwent bronchoscopy with Dr.Aleskerov left upper lobe biopsy was nondiagnostic.  However station 4, 7 and 10 L lymph nodes were consistent with metastatic small cell carcinoma.   PET CT scan showed enlarged level 3 cervical lymph node 2.2 cm that was hypermetabolic with an SUV of 9.7.  Large central 7.3 cm AP window mass.  5.7 cm left liver mass.  MRI brain with and without contrast showed 2 small foci of enhancement in the right cerebellar hemisphere and right temporal lobe without mass-effect or edema as well concerning for brain metastases   Patient had 2 cycles of carbo etoposide chemotherapy along with Tecentriq and subsequent scan  showed no significant improvement in the primary lung mass or liver mass.  She received radiation treatment to her primary lung mass and also seen by [facility] for second opinion.  Her pathology was reviewed at [facility] and reported as possible neuroendocrine neoplasm But stated that small cell carcinoma cannot be excluded but not definitely diagnostic for it.  However [facility] pathology feels that this is consistent with small cell lung cancer.  She has completed 4 cycles of carbo etoposide Tecentriq chemotherapy and is currently on maintenance Tecentriq.  Repeat liver biopsy was also performed and was consistent with small cell lung cancer   Disease progression after 23 cycles of maintenance Tecentriq in the left supraclavicular lymph node region.  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  Plan to switch patient to second line lurbinectedin  Interval history-tolerating treatments well so far.  Denies any nausea vomiting diarrhea.  Denies any recurrent infections.  Appetite and weight have remained stable.  Leg swelling has resolved.  ECOG PS- 1 Pain scale- 0   Review of systems- Review of Systems  Constitutional:  Negative for chills, fever, malaise/fatigue and weight loss.  HENT:  Negative for congestion, ear discharge and nosebleeds.   Eyes:  Negative for blurred vision.  Respiratory:  Negative for cough, hemoptysis, sputum production, shortness of breath and wheezing.   Cardiovascular:  Negative for chest pain, palpitations, orthopnea and claudication.  Gastrointestinal:  Negative for abdominal pain, blood in stool, constipation, diarrhea, heartburn, melena, nausea and vomiting.  Genitourinary:  Negative for  dysuria, flank pain, frequency, hematuria and urgency.  Musculoskeletal:  Negative for back pain, joint pain and myalgias.  Skin:  Negative for rash.  Neurological:  Negative for dizziness, tingling, focal weakness, seizures, weakness and headaches.   Endo/Heme/Allergies:  Does not bruise/bleed easily.  Psychiatric/Behavioral:  Negative for depression and suicidal ideas. The patient does not have insomnia.       No Known Allergies   Past Medical History:  Diagnosis Date   Cancer ([place])    Cataract    Diabetes mellitus without complication ([place])    Hyperlipidemia    Hypertension    Hypothyroidism    doctor's keeping an eye on thyroid    Lung cancer [facility])      Past Surgical History:  Procedure Laterality Date   ABDOMINAL HYSTERECTOMY     IR CV LINE INJECTION  09/28/2021   PORTA CATH INSERTION N/A 11/30/2020   Procedure: PORTA CATH INSERTION;  Surgeon: Algernon Huxley, MD;  Location: [facility];  Service: Cardiovascular;  Laterality: N/A;   VIDEO BRONCHOSCOPY WITH ENDOBRONCHIAL NAVIGATION N/A 10/21/2020   Procedure: VIDEO BRONCHOSCOPY WITH ENDOBRONCHIAL NAVIGATION;  Surgeon: Ottie Glazier, MD;  Location: [facility] ORS;  Service: Thoracic;  Laterality: N/A;   VIDEO BRONCHOSCOPY WITH ENDOBRONCHIAL ULTRASOUND N/A 10/21/2020   Procedure: VIDEO BRONCHOSCOPY WITH ENDOBRONCHIAL ULTRASOUND;  Surgeon: Ottie Glazier, MD;  Location: [facility] OR[facility];  Service: Thoracic;  Laterality: N/A;    Social History   Socioeconomic History   Marital status: Single    Spouse name: Not on file   Number of children: Not on file   Years of education: Not on file   Highest education level: Not on file  Occupational History   Not on file  Tobacco Use   Smoking status: Never   Smokeless tobacco: Never  Vaping Use   Vaping Use: Never used  Substance and Sexual Activity   Alcohol use: No   Drug use: No   Sexual activity: Not Currently  Other Topics Concern   Not on file  Social History Narrative   Not on file   Social Determinants of Health   Financial Resource Strain: Not on file  Food Insecurity: Food Insecurity Present (07/08/2021)   Hunger Vital Sign    Worried About Running Out of Food in the Last Year: Sometimes true    Ran Out of  Food in the Last Year: Sometimes true  Transportation Needs: Unmet Transportation Needs (11/01/2022)   PRAPARE - Hydrologist (Medical): Yes    Lack of Transportation (Non-Medical): Yes  Physical Activity: Not on file  Stress: Not on file  Social Connections: Not on file  Intimate Partner Violence: Not on file    Family History  Problem Relation Age of Onset   Cancer Mother        breast   Hypertension Mother    Breast cancer Mother 37   Heart disease Father    Hyperlipidemia Brother    Heart disease Brother      Current Outpatient Medications:    dexamethasone (DECADRON) 4 MG tablet, Take 1 tablet (4 mg total) by mouth 2 (two) times daily with a meal., Disp: 60 tablet, Rfl: 1   ELIQUIS 5 MG TABS tablet, TAKE 1 TABLET(5 MG) BY MOUTH TWICE DAILY, Disp: 60 tablet, Rfl: 1   fluticasone (FLONASE) 50 MCG/ACT nasal spray, Place 2 sprays into both nostrils daily., Disp: 16 g, Rfl: 5   furosemide (LASIX) 20 MG tablet, Take 1 tablet (20  mg total) by mouth daily., Disp: 60 tablet, Rfl: 0   lansoprazole (PREVACID) 30 MG capsule, Take 1 capsule (30 mg total) by mouth daily., Disp: 30 capsule, Rfl: 1   losartan (COZAAR) 25 MG tablet, Take 25 mg by mouth daily., Disp: , Rfl:    metFORMIN (GLUCOPHAGE-XR) 500 MG 24 hr tablet, Take 500 mg by mouth daily with breakfast., Disp: , Rfl:    mirtazapine (REMERON) 15 MG tablet, Take 15 mg by mouth at bedtime., Disp: , Rfl:    potassium chloride SA (KLOR-CON M) 20 MEQ tablet, Take 2 tablets (40 mEq total) by mouth daily., Disp: 7 tablet, Rfl: 0   lidocaine-prilocaine (EMLA) cream, Apply 1 Application topically as needed. Apply small amount to port site at least 1 hour prior to it being accessed, cover with plastic wrap, Disp: 30 g, Rfl: 1   naloxone (NARCAN) nasal spray 4 mg/0.1 mL, , Disp: , Rfl:    oxycodone (ROXICODONE) 30 MG immediate release tablet, Take 1 tablet (30 mg total) by mouth every 4 (four) hours as needed for  pain. (Patient not taking: Reported on 10/11/2022), Disp: 180 tablet, Rfl: 0   polyethylene glycol (MIRALAX / GLYCOLAX) 17 g packet, Take 17 g by mouth daily. (Patient not taking: Reported on 07/19/2022), Disp: , Rfl:    traZODone (DESYREL) 50 MG tablet, Take by mouth. (Patient not taking: Reported on 10/11/2022), Disp: , Rfl:  No current facility-administered medications for this visit.  Facility-Administered Medications Ordered in Other Visits:    0.9 %  sodium chloride infusion, , Intravenous, Once, Sindy Guadeloupe, MD   dexamethasone (DECADRON) 10 mg in sodium chloride 0.9 % 50 mL IVPB, 10 mg, Intravenous, Once, Sindy Guadeloupe, MD   heparin lock flush 100 unit/mL, 500 Units, Intracatheter, Once PRN, Sindy Guadeloupe, MD   lurbinectedin (ZEPZELCA) 5.75 mg in sodium chloride 0.9 % 250 mL chemo infusion, 3.2 mg/m2 (Treatment Plan Recorded), Intravenous, Once, Sindy Guadeloupe, MD   palonosetron (ALOXI) injection 0.25 mg, 0.25 mg, Intravenous, Once, Sindy Guadeloupe, MD   sodium chloride flush (NS) 0.9 % injection 10 mL, 10 mL, Intravenous, PRN, Sindy Guadeloupe, MD, 10 mL at 12/15/20 0850  Physical exam:  Vitals:   11/01/22 0900  BP: (!) 151/74  Pulse: 70  Resp: 16  Temp: (!) 96.7 F (35.9 C)  TempSrc: Tympanic  SpO2: 100%  Weight: 166 lb 14.4 oz (75.7 kg)  Height: 5\' 6"  (1.676 m)   Physical Exam Constitutional:      General: She is not in acute distress. Cardiovascular:     Rate and Rhythm: Normal rate and regular rhythm.     Heart sounds: Normal heart sounds.  Pulmonary:     Effort: Pulmonary effort is normal.     Breath sounds: Normal breath sounds.  Abdominal:     General: Bowel sounds are normal.     Palpations: Abdomen is soft.  Skin:    General: Skin is warm and dry.  Neurological:     Mental Status: She is alert and oriented to person, place, and time.         Latest Ref Rng & Units 11/01/2022    9:05 AM  CMP  Glucose 70 - 99 mg/dL 86   BUN 8 - 23 mg/dL 15    Creatinine 0.44 - 1.00 mg/dL 0.52   Sodium 135 - 145 mmol/L 140   Potassium 3.5 - 5.1 mmol/L 3.7   Chloride 98 - 111 mmol/L 111  CO2 22 - 32 mmol/L 23   Calcium 8.9 - 10.3 mg/dL 9.4   Total Protein 6.5 - 8.1 g/dL 7.4   Total Bilirubin 0.3 - 1.2 mg/dL 0.6   Alkaline Phos 38 - 126 U/L 74   AST 15 - 41 U/L 14   ALT 0 - 44 U/L 9       Latest Ref Rng & Units 11/01/2022    9:05 AM  CBC  WBC 4.0 - 10.5 K/uL 2.3   Hemoglobin 12.0 - 15.0 g/dL 11.1   Hematocrit 36.0 - 46.0 % 33.5   Platelets 150 - 400 K/uL 285     No images are attached to the encounter.  CT CHEST ABDOMEN PELVIS W CONTRAST  Result Date: 10/18/2022 CLINICAL DATA:  Lung cancer; * Tracking Code: BO * EXAM: CT CHEST, ABDOMEN, AND PELVIS WITH CONTRAST TECHNIQUE: Multidetector CT imaging of the chest, abdomen and pelvis was performed following the standard protocol during bolus administration of intravenous contrast. RADIATION DOSE REDUCTION: This exam was performed according to the departmental dose-optimization program which includes automated exposure control, adjustment of the mA and/or kV according to patient size and/or use of iterative reconstruction technique. CONTRAST:  134mL OMNIPAQUE IOHEXOL 300 MG/ML  SOLN COMPARISON:  CT chest, abdomen and pelvis dated July 11, 2022 FINDINGS: CT CHEST FINDINGS Cardiovascular: Normal heart size. No pericardial effusion. Normal caliber thoracic aorta with mild atherosclerotic disease. Mediastinum/Nodes: Esophagus is unremarkable. Unchanged heterogeneous and enlarged thyroid gland. Unchanged mild periaortic soft tissue. Left supraclavicular lymph node measuring 1.4 x 1.9 cm in short axis on series 2, image 34, previously measured 1.6 x 2.7 cm. No enlarged hilar or mediastinal lymph nodes Lungs/Pleura: Central airways are patent. Left paramediastinal postradiation change, unchanged when compared with prior exam. Stable scattered small solid pulmonary nodules. Reference nodule of the right  lower lobe measuring 4 mm on series 4, image 68. Musculoskeletal: No aggressive appearing osseous lesions. CT ABDOMEN PELVIS FINDINGS Hepatobiliary: Stable hypodense treated lesion of the anterior liver adjacent to the gallbladder fossa measuring 3.1 x 2.5 cm on series 2, image 48 unchanged more ill-defined subcapsular hypodensity adjacent to the falciform ligament which is likely due to focal fatty infiltration. Focal hyperattenuating lesion of the right hepatic lobe measuring 1.3 cm on series 2, image 4, likely a flash fill hemangioma. No gallstones, gallbladder wall thickening, or biliary dilatation. Pancreas: Unremarkable. No pancreatic ductal dilatation or surrounding inflammatory changes. Spleen: Normal in size without focal abnormality. Adrenals/Urinary Tract: Bilateral adrenal glands are unremarkable. No hydronephrosis or nephrolithiasis. Bladder is unremarkable. Stomach/Bowel: Stomach is within normal limits. Appendix appears normal. No evidence of bowel wall thickening, distention, or inflammatory changes. Vascular/Lymphatic: Aortic atherosclerosis. No enlarged abdominal or pelvic lymph nodes. Reproductive: Status post hysterectomy. No adnexal masses. Other: No abdominal wall hernia or abnormality. No abdominopelvic ascites. Musculoskeletal: No acute or significant osseous findings. IMPRESSION: 1. Stable post treatment changes of the left lung. No evidence of local recurrence. 2. Enlarged left supraclavicular lymph node, decreased in size when compared with prior exam. 3. Stable small solid pulmonary nodules. 4. Stable hypodense treated lesion of the anterior liver adjacent to the gallbladder fossa. 5. Aortic Atherosclerosis (ICD10-I70.0). Electronically Signed   By: Yetta Glassman M.D.   On: 10/18/2022 17:40     Assessment and plan- Patient is a 71 y.o. female with extensive stage small cell lung cancer with liver, lymph node and brain metastases here for on treatment assessment prior to cycle 5 of  lurbinectedin  I have reviewed CT  chest abdomen and pelvis images independently andDiscussed findings with the patient which overall shows decrease in the size of the left supraclavicular lymph node.  No other findings of progressive or new disease.  Plan is therefore to continue with lurbinectedin until progression or toxicity.  I will see her back in 3 weeks for cycle 6.  History of renal vein thrombosis: Currently on Eliquis which she will continue.  Chemo-induced neutropenia: ANC 1.3 today and she receives [place] with this cycle as well.   Visit Diagnosis 1. Small cell carcinoma of lung metastatic to liver ([place])   2. Encounter for antineoplastic chemotherapy   3. Chemotherapy induced neutropenia ([facility])      Dr. Randa Evens, MD, MPH [name] [phone] 11/01/2022 9:54 AM

## 2022-11-01 NOTE — Addendum Note (Signed)
Addended by: Randa Evens C on: 11/01/2022 10:05 AM   Modules accepted: Level of Service

## 2022-11-02 LAB — VITAMIN B12: Vitamin B-12: 2496 pg/mL — ABNORMAL HIGH (ref 180–914)

## 2022-11-03 ENCOUNTER — Inpatient Hospital Stay: Payer: Medicare HMO

## 2022-11-03 DIAGNOSIS — C7931 Secondary malignant neoplasm of brain: Secondary | ICD-10-CM | POA: Diagnosis not present

## 2022-11-03 DIAGNOSIS — C770 Secondary and unspecified malignant neoplasm of lymph nodes of head, face and neck: Secondary | ICD-10-CM | POA: Diagnosis not present

## 2022-11-03 DIAGNOSIS — C787 Secondary malignant neoplasm of liver and intrahepatic bile duct: Secondary | ICD-10-CM

## 2022-11-03 DIAGNOSIS — T451X5A Adverse effect of antineoplastic and immunosuppressive drugs, initial encounter: Secondary | ICD-10-CM | POA: Diagnosis not present

## 2022-11-03 DIAGNOSIS — Z5111 Encounter for antineoplastic chemotherapy: Secondary | ICD-10-CM | POA: Diagnosis not present

## 2022-11-03 DIAGNOSIS — C3412 Malignant neoplasm of upper lobe, left bronchus or lung: Secondary | ICD-10-CM | POA: Diagnosis not present

## 2022-11-03 DIAGNOSIS — E049 Nontoxic goiter, unspecified: Secondary | ICD-10-CM | POA: Diagnosis not present

## 2022-11-03 DIAGNOSIS — Z5189 Encounter for other specified aftercare: Secondary | ICD-10-CM | POA: Diagnosis not present

## 2022-11-03 DIAGNOSIS — D701 Agranulocytosis secondary to cancer chemotherapy: Secondary | ICD-10-CM | POA: Diagnosis not present

## 2022-11-03 MED ORDER — PEGFILGRASTIM-CBQV 6 MG/0.6ML ~~LOC~~ SOSY
6.0000 mg | PREFILLED_SYRINGE | Freq: Once | SUBCUTANEOUS | Status: AC
  Administered 2022-11-03: 6 mg via SUBCUTANEOUS
  Filled 2022-11-03: qty 0.6

## 2022-11-17 ENCOUNTER — Telehealth: Payer: Self-pay | Admitting: *Deleted

## 2022-11-17 NOTE — Telephone Encounter (Signed)
FMLA for daughter received, completed and sent for physician signature

## 2022-11-18 ENCOUNTER — Encounter: Payer: Self-pay | Admitting: Oncology

## 2022-11-18 NOTE — Telephone Encounter (Signed)
FMLA signed and faxed to [facility] daughter and got her voice mail. Left message on voice mail that form is completed and faxed to [facility]

## 2022-11-21 MED FILL — Dexamethasone Sodium Phosphate Inj 100 MG/10ML: INTRAMUSCULAR | Qty: 1 | Status: AC

## 2022-11-22 ENCOUNTER — Inpatient Hospital Stay: Payer: Medicare HMO | Admitting: Oncology

## 2022-11-22 ENCOUNTER — Inpatient Hospital Stay: Payer: Medicare HMO

## 2022-11-22 ENCOUNTER — Inpatient Hospital Stay: Payer: Medicare HMO | Attending: Oncology | Admitting: Oncology

## 2022-11-22 ENCOUNTER — Encounter: Payer: Self-pay | Admitting: Oncology

## 2022-11-22 VITALS — BP 114/74 | HR 62 | Temp 98.7°F | Resp 16 | Ht 66.0 in | Wt 161.9 lb

## 2022-11-22 DIAGNOSIS — C3412 Malignant neoplasm of upper lobe, left bronchus or lung: Secondary | ICD-10-CM | POA: Insufficient documentation

## 2022-11-22 DIAGNOSIS — Z86718 Personal history of other venous thrombosis and embolism: Secondary | ICD-10-CM | POA: Insufficient documentation

## 2022-11-22 DIAGNOSIS — C787 Secondary malignant neoplasm of liver and intrahepatic bile duct: Secondary | ICD-10-CM | POA: Diagnosis not present

## 2022-11-22 DIAGNOSIS — T451X5A Adverse effect of antineoplastic and immunosuppressive drugs, initial encounter: Secondary | ICD-10-CM

## 2022-11-22 DIAGNOSIS — D6481 Anemia due to antineoplastic chemotherapy: Secondary | ICD-10-CM | POA: Diagnosis not present

## 2022-11-22 DIAGNOSIS — Z90710 Acquired absence of both cervix and uterus: Secondary | ICD-10-CM | POA: Diagnosis not present

## 2022-11-22 DIAGNOSIS — E049 Nontoxic goiter, unspecified: Secondary | ICD-10-CM | POA: Diagnosis not present

## 2022-11-22 DIAGNOSIS — Z7901 Long term (current) use of anticoagulants: Secondary | ICD-10-CM | POA: Diagnosis not present

## 2022-11-22 DIAGNOSIS — Z8349 Family history of other endocrine, nutritional and metabolic diseases: Secondary | ICD-10-CM | POA: Insufficient documentation

## 2022-11-22 DIAGNOSIS — Z803 Family history of malignant neoplasm of breast: Secondary | ICD-10-CM | POA: Diagnosis not present

## 2022-11-22 DIAGNOSIS — C770 Secondary and unspecified malignant neoplasm of lymph nodes of head, face and neck: Secondary | ICD-10-CM | POA: Insufficient documentation

## 2022-11-22 DIAGNOSIS — R0789 Other chest pain: Secondary | ICD-10-CM | POA: Insufficient documentation

## 2022-11-22 DIAGNOSIS — R160 Hepatomegaly, not elsewhere classified: Secondary | ICD-10-CM | POA: Insufficient documentation

## 2022-11-22 DIAGNOSIS — Z5189 Encounter for other specified aftercare: Secondary | ICD-10-CM | POA: Insufficient documentation

## 2022-11-22 DIAGNOSIS — C3490 Malignant neoplasm of unspecified part of unspecified bronchus or lung: Secondary | ICD-10-CM

## 2022-11-22 DIAGNOSIS — Z5941 Food insecurity: Secondary | ICD-10-CM | POA: Insufficient documentation

## 2022-11-22 DIAGNOSIS — C7931 Secondary malignant neoplasm of brain: Secondary | ICD-10-CM | POA: Diagnosis not present

## 2022-11-22 DIAGNOSIS — E876 Hypokalemia: Secondary | ICD-10-CM | POA: Diagnosis not present

## 2022-11-22 DIAGNOSIS — Z5111 Encounter for antineoplastic chemotherapy: Secondary | ICD-10-CM

## 2022-11-22 DIAGNOSIS — Z79899 Other long term (current) drug therapy: Secondary | ICD-10-CM | POA: Diagnosis not present

## 2022-11-22 DIAGNOSIS — Z8249 Family history of ischemic heart disease and other diseases of the circulatory system: Secondary | ICD-10-CM | POA: Diagnosis not present

## 2022-11-22 DIAGNOSIS — Z87891 Personal history of nicotine dependence: Secondary | ICD-10-CM | POA: Diagnosis not present

## 2022-11-22 LAB — COMPREHENSIVE METABOLIC PANEL
ALT: 9 U/L (ref 0–44)
AST: 13 U/L — ABNORMAL LOW (ref 15–41)
Albumin: 3.6 g/dL (ref 3.5–5.0)
Alkaline Phosphatase: 66 U/L (ref 38–126)
Anion gap: 9 (ref 5–15)
BUN: 12 mg/dL (ref 8–23)
CO2: 23 mmol/L (ref 22–32)
Calcium: 9 mg/dL (ref 8.9–10.3)
Chloride: 107 mmol/L (ref 98–111)
Creatinine, Ser: 0.51 mg/dL (ref 0.44–1.00)
GFR, Estimated: >60 mL/min (ref >60)
Glucose, Bld: 93 mg/dL (ref 70–99)
Potassium: 3.5 mmol/L (ref 3.5–5.1)
Sodium: 139 mmol/L (ref 135–145)
Total Bilirubin: 0.7 mg/dL (ref 0.3–1.2)
Total Protein: 6.8 g/dL (ref 6.5–8.1)

## 2022-11-22 LAB — CBC WITH DIFFERENTIAL/PLATELET
Abs Immature Granulocytes: 0 10*3/uL (ref 0.00–0.07)
Basophils Absolute: 0 10*3/uL (ref 0.0–0.1)
Basophils Relative: 0 %
Eosinophils Absolute: 0.1 10*3/uL (ref 0.0–0.5)
Eosinophils Relative: 2 %
HCT: 30.8 % — ABNORMAL LOW (ref 36.0–46.0)
Hemoglobin: 10.1 g/dL — ABNORMAL LOW (ref 12.0–15.0)
Immature Granulocytes: 0 %
Lymphocytes Relative: 19 %
Lymphs Abs: 0.8 10*3/uL (ref 0.7–4.0)
MCH: 31.1 pg (ref 26.0–34.0)
MCHC: 32.8 g/dL (ref 30.0–36.0)
MCV: 94.8 fL (ref 80.0–100.0)
Monocytes Absolute: 0.5 10*3/uL (ref 0.1–1.0)
Monocytes Relative: 13 %
Neutro Abs: 2.8 10*3/uL (ref 1.7–7.7)
Neutrophils Relative %: 66 %
Platelets: 326 10*3/uL (ref 150–400)
RBC: 3.25 MIL/uL — ABNORMAL LOW (ref 3.87–5.11)
RDW: 14.9 % (ref 11.5–15.5)
WBC: 4.1 10*3/uL (ref 4.0–10.5)
nRBC: 0 % (ref 0.0–0.2)

## 2022-11-22 MED ORDER — SODIUM CHLORIDE 0.9% FLUSH
10.0000 mL | INTRAVENOUS | Status: DC | PRN
  Administered 2022-11-22: 10 mL
  Filled 2022-11-22: qty 10

## 2022-11-22 MED ORDER — SODIUM CHLORIDE 0.9 % IV SOLN
10.0000 mg | Freq: Once | INTRAVENOUS | Status: AC
  Administered 2022-11-22: 10 mg via INTRAVENOUS
  Filled 2022-11-22: qty 10

## 2022-11-22 MED ORDER — SODIUM CHLORIDE 0.9 % IV SOLN
3.2000 mg/m2 | Freq: Once | INTRAVENOUS | Status: AC
  Administered 2022-11-22: 5.75 mg via INTRAVENOUS
  Filled 2022-11-22: qty 11.5

## 2022-11-22 MED ORDER — HEPARIN SOD (PORK) LOCK FLUSH 100 UNIT/ML IV SOLN
500.0000 [IU] | Freq: Once | INTRAVENOUS | Status: AC | PRN
  Administered 2022-11-22: 500 [IU]
  Filled 2022-11-22: qty 5

## 2022-11-22 MED ORDER — PALONOSETRON HCL INJECTION 0.25 MG/5ML
0.2500 mg | Freq: Once | INTRAVENOUS | Status: AC
  Administered 2022-11-22: 0.25 mg via INTRAVENOUS
  Filled 2022-11-22: qty 5

## 2022-11-22 MED ORDER — SODIUM CHLORIDE 0.9 % IV SOLN
Freq: Once | INTRAVENOUS | Status: AC
  Filled 2022-11-22: qty 250

## 2022-11-22 NOTE — Patient Instructions (Signed)
[facility] CANCER CTR AT [place]  Discharge Instructions: Thank you for choosing [facility] to provide your oncology and hematology care.  If you have a lab appointment with the [facility], please go directly to the [facility] and check in at the registration area.  Wear comfortable clothing and clothing appropriate for easy access to any Portacath or PICC line.   We strive to give you quality time with your provider. You may need to reschedule your appointment if you arrive late (15 or more minutes).  Arriving late affects you and other patients whose appointments are after yours.  Also, if you miss three or more appointments without notifying the office, you may be dismissed from the clinic at the provider's discretion.      For prescription refill requests, have your pharmacy contact our office and allow 72 hours for refills to be completed.    Today you received the following chemotherapy and/or immunotherapy agents- Zepzelca      To help prevent nausea and vomiting after your treatment, we encourage you to take your nausea medication as directed.  BELOW ARE SYMPTOMS THAT SHOULD BE REPORTED IMMEDIATELY: *FEVER GREATER THAN 100.4 F (38 C) OR HIGHER *CHILLS OR SWEATING *NAUSEA AND VOMITING THAT IS NOT CONTROLLED WITH YOUR NAUSEA MEDICATION *UNUSUAL SHORTNESS OF BREATH *UNUSUAL BRUISING OR BLEEDING *URINARY PROBLEMS (pain or burning when urinating, or frequent urination) *BOWEL PROBLEMS (unusual diarrhea, constipation, pain near the anus) TENDERNESS IN MOUTH AND THROAT WITH OR WITHOUT PRESENCE OF ULCERS (sore throat, sores in mouth, or a toothache) UNUSUAL RASH, SWELLING OR PAIN  UNUSUAL VAGINAL DISCHARGE OR ITCHING   Items with * indicate a potential emergency and should be followed up as soon as possible or go to the Emergency Department if any problems should occur.  Please show the CHEMOTHERAPY ALERT CARD or IMMUNOTHERAPY ALERT CARD at check-in to  the Emergency Department and triage nurse.  Should you have questions after your visit or need to cancel or reschedule your appointment, please contact [facility] CANCER [place] AT [place]  [phone] and follow the prompts.  Office hours are 8:00 a.m. to 4:30 p.m. Monday - Friday. Please note that voicemails left after 4:00 p.m. may not be returned until the following business day.  We are closed weekends and major holidays. You have access to a nurse at all times for urgent questions. Please call the main number to the clinic [phone] and follow the prompts.  For any non-urgent questions, you may also contact your provider using MyChart. We now offer e-Visits for anyone 37 and older to request care online for non-urgent symptoms. For details visit [email or web address].   Also download the MyChart app! Go to the app store, search "MyChart", open the app, select [facility], and log in with your MyChart username and password.

## 2022-11-22 NOTE — Progress Notes (Signed)
Hematology/Oncology Consult note [facility]  [phone] [phone]  Patient Care Team: Leonel Ramsay, MD as PCP - General (Infectious Diseases) Telford Nab, RN as Oncology Nurse Navigator Sindy Guadeloupe, MD as Consulting Physician (Hematology and Oncology)   Name of the patient: Joselle Deeds  [id number]  Jul 05, 1951   Date of visit: 11/22/22  Diagnosis-  extensive stage small cell lung cancer with liver lymph node and brain metastases     Chief complaint/ Reason for visit-on treatment assessment prior to cycle 6 of lurbinectedin  Heme/Onc history: Patient is a 72 year old female with a remote history of smoking in her teenage years and exposure to passive smoking.  She has been having ongoing midsternal pain which has been growing since the last 6 to 7 months.  She had been to urgent care as well in the past.  She finally had a CT chest with contrast done in October 2021 which showed a large mass in the left side of the mediastinum measuring 6.7 x 6.2 x 6.1 cm causing severe compression of the left main pulmonary artery and around the aortic arch in the left subclavian artery.  Enlarged thyroid gland.  Left upper lobe nodule 1 x 0.7 cm.  She then underwent bronchoscopy with Dr.Aleskerov left upper lobe biopsy was nondiagnostic.  However station 4, 7 and 10 L lymph nodes were consistent with metastatic small cell carcinoma.   PET CT scan showed enlarged level 3 cervical lymph node 2.2 cm that was hypermetabolic with an SUV of 9.7.  Large central 7.3 cm AP window mass.  5.7 cm left liver mass.  MRI brain with and without contrast showed 2 small foci of enhancement in the right cerebellar hemisphere and right temporal lobe without mass-effect or edema as well concerning for brain metastases   Patient had 2 cycles of carbo etoposide chemotherapy along with Tecentriq and subsequent scan showed no significant improvement in the primary lung  mass or liver mass.  She received radiation treatment to her primary lung mass and also seen by [facility] for second opinion.  Her pathology was reviewed at [facility] and reported as possible neuroendocrine neoplasm But stated that small cell carcinoma cannot be excluded but not definitely diagnostic for it.  However [facility] pathology feels that this is consistent with small cell lung cancer.  She has completed 4 cycles of carbo etoposide Tecentriq chemotherapy and is currently on maintenance Tecentriq.  Repeat liver biopsy was also performed and was consistent with small cell lung cancer   Disease progression after 23 cycles of maintenance Tecentriq in the left supraclavicular lymph node region.  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  Plan to switch patient to second line lurbinectedin  Interval history-she is tolerating treatments well without any significant side effects.  Denies any nausea vomiting diarrhea chest wall pain or leg swelling.  ECOG PS- 1 Pain scale- 0   Review of systems- Review of Systems  Constitutional:  Negative for chills, fever, malaise/fatigue and weight loss.  HENT:  Negative for congestion, ear discharge and nosebleeds.   Eyes:  Negative for blurred vision.  Respiratory:  Negative for cough, hemoptysis, sputum production, shortness of breath and wheezing.   Cardiovascular:  Negative for chest pain, palpitations, orthopnea and claudication.  Gastrointestinal:  Negative for abdominal pain, blood in stool, constipation, diarrhea, heartburn, melena, nausea and vomiting.  Genitourinary:  Negative for dysuria, flank pain, frequency, hematuria and urgency.  Musculoskeletal:  Negative for back pain,  joint pain and myalgias.  Skin:  Negative for rash.  Neurological:  Negative for dizziness, tingling, focal weakness, seizures, weakness and headaches.  Endo/Heme/Allergies:  Does not bruise/bleed easily.  Psychiatric/Behavioral:  Negative for  depression and suicidal ideas. The patient does not have insomnia.       No Known Allergies   Past Medical History:  Diagnosis Date   Cancer ([place])    Cataract    Diabetes mellitus without complication ([place])    Hyperlipidemia    Hypertension    Hypothyroidism    doctor's keeping an eye on thyroid    Lung cancer [facility])      Past Surgical History:  Procedure Laterality Date   ABDOMINAL HYSTERECTOMY     IR CV LINE INJECTION  09/28/2021   PORTA CATH INSERTION N/A 11/30/2020   Procedure: PORTA CATH INSERTION;  Surgeon: Algernon Huxley, MD;  Location: [place] CV LAB;  Service: Cardiovascular;  Laterality: N/A;   VIDEO BRONCHOSCOPY WITH ENDOBRONCHIAL NAVIGATION N/A 10/21/2020   Procedure: VIDEO BRONCHOSCOPY WITH ENDOBRONCHIAL NAVIGATION;  Surgeon: Ottie Glazier, MD;  Location: [facility] ORS;  Service: Thoracic;  Laterality: N/A;   VIDEO BRONCHOSCOPY WITH ENDOBRONCHIAL ULTRASOUND N/A 10/21/2020   Procedure: VIDEO BRONCHOSCOPY WITH ENDOBRONCHIAL ULTRASOUND;  Surgeon: Ottie Glazier, MD;  Location: [facility] ORS;  Service: Thoracic;  Laterality: N/A;    Social History   Socioeconomic History   Marital status: Single    Spouse name: Not on file   Number of children: Not on file   Years of education: Not on file   Highest education level: Not on file  Occupational History   Not on file  Tobacco Use   Smoking status: Never   Smokeless tobacco: Never  Vaping Use   Vaping Use: Never used  Substance and Sexual Activity   Alcohol use: No   Drug use: No   Sexual activity: Not Currently  Other Topics Concern   Not on file  Social History Narrative   Not on file   Social Determinants of Health   Financial Resource Strain: Not on file  Food Insecurity: Food Insecurity Present (07/08/2021)   Hunger Vital Sign    Worried About Running Out of Food in the Last Year: Sometimes true    Ran Out of Food in the Last Year: Sometimes true  Transportation Needs: Unmet Transportation Needs  (11/22/2022)   PRAPARE - Hydrologist (Medical): Yes    Lack of Transportation (Non-Medical): Yes  Physical Activity: Not on file  Stress: Not on file  Social Connections: Not on file  Intimate Partner Violence: Not on file    Family History  Problem Relation Age of Onset   Cancer Mother        breast   Hypertension Mother    Breast cancer Mother 51   Heart disease Father    Hyperlipidemia Brother    Heart disease Brother      Current Outpatient Medications:    dexamethasone (DECADRON) 4 MG tablet, Take 1 tablet (4 mg total) by mouth 2 (two) times daily with a meal., Disp: 60 tablet, Rfl: 1   ELIQUIS 5 MG TABS tablet, TAKE 1 TABLET(5 MG) BY MOUTH TWICE DAILY, Disp: 60 tablet, Rfl: 1   fluticasone (FLONASE) 50 MCG/ACT nasal spray, Place 2 sprays into both nostrils daily., Disp: 16 g, Rfl: 5   furosemide (LASIX) 20 MG tablet, Take 1 tablet (20 mg total) by mouth daily., Disp: 60 tablet, Rfl: 0   lansoprazole (PREVACID)  30 MG capsule, Take 1 capsule (30 mg total) by mouth daily., Disp: 30 capsule, Rfl: 1   lidocaine-prilocaine (EMLA) cream, Apply 1 Application topically as needed. Apply small amount to port site at least 1 hour prior to it being accessed, cover with plastic wrap, Disp: 30 g, Rfl: 1   losartan (COZAAR) 25 MG tablet, Take 25 mg by mouth daily., Disp: , Rfl:    metFORMIN (GLUCOPHAGE-XR) 500 MG 24 hr tablet, Take 500 mg by mouth daily with breakfast., Disp: , Rfl:    mirtazapine (REMERON) 15 MG tablet, Take 15 mg by mouth at bedtime., Disp: , Rfl:    oxycodone (ROXICODONE) 30 MG immediate release tablet, Take 1 tablet (30 mg total) by mouth every 4 (four) hours as needed for pain., Disp: 180 tablet, Rfl: 0   polyethylene glycol (MIRALAX / GLYCOLAX) 17 g packet, Take 17 g by mouth daily., Disp: , Rfl:    potassium chloride SA (KLOR-CON M) 20 MEQ tablet, Take 2 tablets (40 mEq total) by mouth daily., Disp: 7 tablet, Rfl: 0   naloxone (NARCAN) nasal  spray 4 mg/0.1 mL, , Disp: , Rfl:    traZODone (DESYREL) 50 MG tablet, Take by mouth. (Patient not taking: Reported on 10/11/2022), Disp: , Rfl:  No current facility-administered medications for this visit.  Facility-Administered Medications Ordered in Other Visits:    sodium chloride flush (NS) 0.9 % injection 10 mL, 10 mL, Intravenous, PRN, Sindy Guadeloupe, MD, 10 mL at 12/15/20 0850  Physical exam:  Vitals:   11/22/22 1054  BP: 114/74  Pulse: 62  Resp: 16  Temp: 98.7 F (37.1 C)  TempSrc: Tympanic  Weight: 161 lb 14.4 oz (73.4 kg)  Height: 5\' 6"  (1.676 m)   Physical Exam Cardiovascular:     Rate and Rhythm: Normal rate and regular rhythm.     Heart sounds: Normal heart sounds.  Pulmonary:     Effort: Pulmonary effort is normal.     Breath sounds: Normal breath sounds.  Abdominal:     General: Bowel sounds are normal.     Palpations: Abdomen is soft.  Lymphadenopathy:     Comments: Left supraclavicular lymph node appears stable in size  Skin:    General: Skin is warm and dry.  Neurological:     Mental Status: She is alert and oriented to person, place, and time.         Latest Ref Rng & Units 11/22/2022   10:17 AM  CMP  Glucose 70 - 99 mg/dL 93   BUN 8 - 23 mg/dL 12   Creatinine 0.44 - 1.00 mg/dL 0.51   Sodium 135 - 145 mmol/L 139   Potassium 3.5 - 5.1 mmol/L 3.5   Chloride 98 - 111 mmol/L 107   CO2 22 - 32 mmol/L 23   Calcium 8.9 - 10.3 mg/dL 9.0   Total Protein 6.5 - 8.1 g/dL 6.8   Total Bilirubin 0.3 - 1.2 mg/dL 0.7   Alkaline Phos 38 - 126 U/L 66   AST 15 - 41 U/L 13   ALT 0 - 44 U/L 9       Latest Ref Rng & Units 11/22/2022   10:17 AM  CBC  WBC 4.0 - 10.5 K/uL 4.1   Hemoglobin 12.0 - 15.0 g/dL 10.1   Hematocrit 36.0 - 46.0 % 30.8   Platelets 150 - 400 K/uL 326      Assessment and plan- Patient is a 72 y.o. female with extensive stage small cell  lung cancer with liver lymph node and brain metastases here for on treatment assessment prior to cycle 6  of lurbinectedin  Counts okay to proceed with cycle 6 of lurbinectedin today with Udenyca on day 3.  I will see her back in 3 weeks for cycle 7.  Recent scans in December 2023 showed decreased size of left supraclavicular lymph node and no other evidence of progressive disease.  Chemo-induced anemia: Recent iron studies B12 and folate were within normal limits.  Continue to monitor.  Overall stable between 10-11.  History of renal vein thrombosis: Continue Eliquis   Visit Diagnosis 1. Small cell carcinoma of lung metastatic to liver ([place])   2. Encounter for antineoplastic chemotherapy   3. Antineoplastic chemotherapy induced anemia      Dr. Randa Evens, MD, MPH [facility] at [facility] [phone] 11/22/2022 11:03 AM

## 2022-11-24 ENCOUNTER — Inpatient Hospital Stay: Payer: Medicare HMO

## 2022-11-24 VITALS — BP 123/63 | HR 84 | Temp 97.7°F | Resp 17

## 2022-11-24 DIAGNOSIS — C3412 Malignant neoplasm of upper lobe, left bronchus or lung: Secondary | ICD-10-CM | POA: Diagnosis not present

## 2022-11-24 DIAGNOSIS — C770 Secondary and unspecified malignant neoplasm of lymph nodes of head, face and neck: Secondary | ICD-10-CM | POA: Diagnosis not present

## 2022-11-24 DIAGNOSIS — C7931 Secondary malignant neoplasm of brain: Secondary | ICD-10-CM | POA: Diagnosis not present

## 2022-11-24 DIAGNOSIS — Z5189 Encounter for other specified aftercare: Secondary | ICD-10-CM | POA: Diagnosis not present

## 2022-11-24 DIAGNOSIS — C787 Secondary malignant neoplasm of liver and intrahepatic bile duct: Secondary | ICD-10-CM | POA: Diagnosis not present

## 2022-11-24 DIAGNOSIS — Z5111 Encounter for antineoplastic chemotherapy: Secondary | ICD-10-CM | POA: Diagnosis not present

## 2022-11-24 DIAGNOSIS — E049 Nontoxic goiter, unspecified: Secondary | ICD-10-CM | POA: Diagnosis not present

## 2022-11-24 DIAGNOSIS — C3490 Malignant neoplasm of unspecified part of unspecified bronchus or lung: Secondary | ICD-10-CM

## 2022-11-24 DIAGNOSIS — R160 Hepatomegaly, not elsewhere classified: Secondary | ICD-10-CM | POA: Diagnosis not present

## 2022-11-24 DIAGNOSIS — E876 Hypokalemia: Secondary | ICD-10-CM | POA: Diagnosis not present

## 2022-11-24 MED ORDER — PEGFILGRASTIM-CBQV 6 MG/0.6ML ~~LOC~~ SOSY
6.0000 mg | PREFILLED_SYRINGE | Freq: Once | SUBCUTANEOUS | Status: AC
  Administered 2022-11-24: 6 mg via SUBCUTANEOUS
  Filled 2022-11-24: qty 0.6

## 2022-12-07 ENCOUNTER — Other Ambulatory Visit: Payer: Self-pay | Admitting: Oncology

## 2022-12-12 ENCOUNTER — Other Ambulatory Visit: Payer: Self-pay

## 2022-12-12 MED FILL — Dexamethasone Sodium Phosphate Inj 100 MG/10ML: INTRAMUSCULAR | Qty: 1 | Status: AC

## 2022-12-13 ENCOUNTER — Inpatient Hospital Stay: Payer: Medicare HMO

## 2022-12-13 ENCOUNTER — Inpatient Hospital Stay (HOSPITAL_BASED_OUTPATIENT_CLINIC_OR_DEPARTMENT_OTHER): Payer: Medicare HMO | Admitting: Oncology

## 2022-12-13 VITALS — BP 155/85 | HR 75 | Temp 97.0°F | Ht 66.0 in | Wt 169.4 lb

## 2022-12-13 DIAGNOSIS — R160 Hepatomegaly, not elsewhere classified: Secondary | ICD-10-CM | POA: Diagnosis not present

## 2022-12-13 DIAGNOSIS — T451X5A Adverse effect of antineoplastic and immunosuppressive drugs, initial encounter: Secondary | ICD-10-CM | POA: Diagnosis not present

## 2022-12-13 DIAGNOSIS — C3490 Malignant neoplasm of unspecified part of unspecified bronchus or lung: Secondary | ICD-10-CM

## 2022-12-13 DIAGNOSIS — D6481 Anemia due to antineoplastic chemotherapy: Secondary | ICD-10-CM | POA: Diagnosis not present

## 2022-12-13 DIAGNOSIS — C3412 Malignant neoplasm of upper lobe, left bronchus or lung: Secondary | ICD-10-CM | POA: Diagnosis not present

## 2022-12-13 DIAGNOSIS — Z5111 Encounter for antineoplastic chemotherapy: Secondary | ICD-10-CM | POA: Diagnosis not present

## 2022-12-13 DIAGNOSIS — C770 Secondary and unspecified malignant neoplasm of lymph nodes of head, face and neck: Secondary | ICD-10-CM | POA: Diagnosis not present

## 2022-12-13 DIAGNOSIS — C787 Secondary malignant neoplasm of liver and intrahepatic bile duct: Secondary | ICD-10-CM

## 2022-12-13 DIAGNOSIS — E876 Hypokalemia: Secondary | ICD-10-CM | POA: Diagnosis not present

## 2022-12-13 DIAGNOSIS — C7931 Secondary malignant neoplasm of brain: Secondary | ICD-10-CM | POA: Diagnosis not present

## 2022-12-13 DIAGNOSIS — Z5189 Encounter for other specified aftercare: Secondary | ICD-10-CM | POA: Diagnosis not present

## 2022-12-13 DIAGNOSIS — E049 Nontoxic goiter, unspecified: Secondary | ICD-10-CM | POA: Diagnosis not present

## 2022-12-13 LAB — COMPREHENSIVE METABOLIC PANEL
ALT: 9 U/L (ref 0–44)
AST: 16 U/L (ref 15–41)
Albumin: 3.8 g/dL (ref 3.5–5.0)
Alkaline Phosphatase: 77 U/L (ref 38–126)
Anion gap: 8 (ref 5–15)
BUN: 10 mg/dL (ref 8–23)
CO2: 23 mmol/L (ref 22–32)
Calcium: 9.1 mg/dL (ref 8.9–10.3)
Chloride: 109 mmol/L (ref 98–111)
Creatinine, Ser: 0.56 mg/dL (ref 0.44–1.00)
GFR, Estimated: >60 mL/min (ref >60)
Glucose, Bld: 121 mg/dL — ABNORMAL HIGH (ref 70–99)
Potassium: 3.4 mmol/L — ABNORMAL LOW (ref 3.5–5.1)
Sodium: 140 mmol/L (ref 135–145)
Total Bilirubin: 0.4 mg/dL (ref 0.3–1.2)
Total Protein: 6.9 g/dL (ref 6.5–8.1)

## 2022-12-13 LAB — CBC WITH DIFFERENTIAL/PLATELET
Abs Immature Granulocytes: 0.01 10*3/uL (ref 0.00–0.07)
Basophils Absolute: 0 10*3/uL (ref 0.0–0.1)
Basophils Relative: 0 %
Eosinophils Absolute: 0 10*3/uL (ref 0.0–0.5)
Eosinophils Relative: 1 %
HCT: 32 % — ABNORMAL LOW (ref 36.0–46.0)
Hemoglobin: 10.6 g/dL — ABNORMAL LOW (ref 12.0–15.0)
Immature Granulocytes: 0 %
Lymphocytes Relative: 8 %
Lymphs Abs: 0.3 10*3/uL — ABNORMAL LOW (ref 0.7–4.0)
MCH: 31.6 pg (ref 26.0–34.0)
MCHC: 33.1 g/dL (ref 30.0–36.0)
MCV: 95.5 fL (ref 80.0–100.0)
Monocytes Absolute: 0.4 10*3/uL (ref 0.1–1.0)
Monocytes Relative: 12 %
Neutro Abs: 2.9 10*3/uL (ref 1.7–7.7)
Neutrophils Relative %: 79 %
Platelets: 318 10*3/uL (ref 150–400)
RBC: 3.35 MIL/uL — ABNORMAL LOW (ref 3.87–5.11)
RDW: 15.8 % — ABNORMAL HIGH (ref 11.5–15.5)
WBC: 3.8 10*3/uL — ABNORMAL LOW (ref 4.0–10.5)
nRBC: 0 % (ref 0.0–0.2)

## 2022-12-13 MED ORDER — HEPARIN SOD (PORK) LOCK FLUSH 100 UNIT/ML IV SOLN
500.0000 [IU] | Freq: Once | INTRAVENOUS | Status: AC | PRN
  Administered 2022-12-13: 500 [IU]
  Filled 2022-12-13: qty 5

## 2022-12-13 MED ORDER — PALONOSETRON HCL INJECTION 0.25 MG/5ML
0.2500 mg | Freq: Once | INTRAVENOUS | Status: AC
  Administered 2022-12-13: 0.25 mg via INTRAVENOUS
  Filled 2022-12-13: qty 5

## 2022-12-13 MED ORDER — SODIUM CHLORIDE 0.9 % IV SOLN
10.0000 mg | Freq: Once | INTRAVENOUS | Status: AC
  Administered 2022-12-13: 10 mg via INTRAVENOUS
  Filled 2022-12-13: qty 10

## 2022-12-13 MED ORDER — SODIUM CHLORIDE 0.9 % IV SOLN
Freq: Once | INTRAVENOUS | Status: AC
  Filled 2022-12-13: qty 250

## 2022-12-13 MED ORDER — SODIUM CHLORIDE 0.9 % IV SOLN
3.2000 mg/m2 | Freq: Once | INTRAVENOUS | Status: AC
  Administered 2022-12-13: 5.75 mg via INTRAVENOUS
  Filled 2022-12-13: qty 11.5

## 2022-12-13 NOTE — Progress Notes (Signed)
Hematology/Oncology Consult note [facility]  [phone] [phone]  Patient Care Team: Leonel Ramsay, MD as PCP - General (Infectious Diseases) Telford Nab, RN as Oncology Nurse Navigator Sindy Guadeloupe, MD as Consulting Physician (Hematology and Oncology)   Name of the patient: Doris Johnson  [id number]  June 15, 1951   Date of visit: 12/13/22  Diagnosis- extensive stage small cell lung cancer with liver lymph node and brain metastases   Chief complaint/ Reason for visit-on treatment assessment prior to cycle 7 of lurbinectedin  Heme/Onc history: Patient is a 73 year old female with a remote history of smoking in her teenage years and exposure to passive smoking.  She has been having ongoing midsternal pain which has been growing since the last 6 to 7 months.  She had been to urgent care as well in the past.  She finally had a CT chest with contrast done in October 2021 which showed a large mass in the left side of the mediastinum measuring 6.7 x 6.2 x 6.1 cm causing severe compression of the left main pulmonary artery and around the aortic arch in the left subclavian artery.  Enlarged thyroid gland.  Left upper lobe nodule 1 x 0.7 cm.  She then underwent bronchoscopy with Dr.Aleskerov left upper lobe biopsy was nondiagnostic.  However station 4, 7 and 10 L lymph nodes were consistent with metastatic small cell carcinoma.   PET CT scan showed enlarged level 3 cervical lymph node 2.2 cm that was hypermetabolic with an SUV of 9.7.  Large central 7.3 cm AP window mass.  5.7 cm left liver mass.  MRI brain with and without contrast showed 2 small foci of enhancement in the right cerebellar hemisphere and right temporal lobe without mass-effect or edema as well concerning for brain metastases   Patient had 2 cycles of carbo etoposide chemotherapy along with Tecentriq and subsequent scan showed no significant improvement in the primary lung mass  or liver mass.  She received radiation treatment to her primary lung mass and also seen by [facility] for second opinion.  Her pathology was reviewed at [facility] and reported as possible neuroendocrine neoplasm But stated that small cell carcinoma cannot be excluded but not definitely diagnostic for it.  However [facility] pathology feels that this is consistent with small cell lung cancer.  She has completed 4 cycles of carbo etoposide Tecentriq chemotherapy and is currently on maintenance Tecentriq.  Repeat liver biopsy was also performed and was consistent with small cell lung cancer   Disease progression after 23 cycles of maintenance Tecentriq in the left supraclavicular lymph node region.  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  Plan to switch patient to second line lurbinectedin  Interval history-she reports no significant complaints today.  Chest wall pain is well-controlled.  She is taking her Eliquis as planned.  No bleeding issues.  Denies any significant nausea vomiting or diarrhea.  Denies any recent infections.  ECOG PS- 1 Pain scale- 0 Opioid associated constipation- no  Review of systems- Review of Systems  Constitutional:  Negative for chills, fever, malaise/fatigue and weight loss.  HENT:  Negative for congestion, ear discharge and nosebleeds.   Eyes:  Negative for blurred vision.  Respiratory:  Negative for cough, hemoptysis, sputum production, shortness of breath and wheezing.   Cardiovascular:  Negative for chest pain, palpitations, orthopnea and claudication.  Gastrointestinal:  Negative for abdominal pain, blood in stool, constipation, diarrhea, heartburn, melena, nausea and vomiting.  Genitourinary:  Negative  for dysuria, flank pain, frequency, hematuria and urgency.  Musculoskeletal:  Negative for back pain, joint pain and myalgias.  Skin:  Negative for rash.  Neurological:  Negative for dizziness, tingling, focal weakness, seizures, weakness and  headaches.  Endo/Heme/Allergies:  Does not bruise/bleed easily.  Psychiatric/Behavioral:  Negative for depression and suicidal ideas. The patient does not have insomnia.       Not on File   Past Medical History:  Diagnosis Date   Cancer [facility])    Cataract    Diabetes mellitus without complication ([place])    Hyperlipidemia    Hypertension    Hypothyroidism    doctor's keeping an eye on thyroid    Lung cancer [facility])      Past Surgical History:  Procedure Laterality Date   ABDOMINAL HYSTERECTOMY     IR CV LINE INJECTION  09/28/2021   PORTA CATH INSERTION N/A 11/30/2020   Procedure: PORTA CATH INSERTION;  Surgeon: Algernon Huxley, MD;  Location: [place] CV LAB;  Service: Cardiovascular;  Laterality: N/A;   VIDEO BRONCHOSCOPY WITH ENDOBRONCHIAL NAVIGATION N/A 10/21/2020   Procedure: VIDEO BRONCHOSCOPY WITH ENDOBRONCHIAL NAVIGATION;  Surgeon: Ottie Glazier, MD;  Location: [facility] ORS;  Service: Thoracic;  Laterality: N/A;   VIDEO BRONCHOSCOPY WITH ENDOBRONCHIAL ULTRASOUND N/A 10/21/2020   Procedure: VIDEO BRONCHOSCOPY WITH ENDOBRONCHIAL ULTRASOUND;  Surgeon: Ottie Glazier, MD;  Location: [facility] ORS;  Service: Thoracic;  Laterality: N/A;    Social History   Socioeconomic History   Marital status: Single    Spouse name: Not on file   Number of children: Not on file   Years of education: Not on file   Highest education level: Not on file  Occupational History   Not on file  Tobacco Use   Smoking status: Never   Smokeless tobacco: Never  Vaping Use   Vaping Use: Never used  Substance and Sexual Activity   Alcohol use: No   Drug use: No   Sexual activity: Not Currently  Other Topics Concern   Not on file  Social History Narrative   Not on file   Social Determinants of Health   Financial Resource Strain: Not on file  Food Insecurity: Food Insecurity Present (07/08/2021)   Hunger Vital Sign    Worried About Running Out of Food in the Last Year: Sometimes true    Ran Out  of Food in the Last Year: Sometimes true  Transportation Needs: Unmet Transportation Needs (12/13/2022)   PRAPARE - Hydrologist (Medical): Yes    Lack of Transportation (Non-Medical): Yes  Physical Activity: Not on file  Stress: Not on file  Social Connections: Not on file  Intimate Partner Violence: Not on file    Family History  Problem Relation Age of Onset   Cancer Mother        breast   Hypertension Mother    Breast cancer Mother 28   Heart disease Father    Hyperlipidemia Brother    Heart disease Brother      Current Outpatient Medications:    dexamethasone (DECADRON) 4 MG tablet, Take 1 tablet (4 mg total) by mouth 2 (two) times daily with a meal., Disp: 60 tablet, Rfl: 1   ELIQUIS 5 MG TABS tablet, TAKE 1 TABLET(5 MG) BY MOUTH TWICE DAILY, Disp: 60 tablet, Rfl: 1   fluticasone (FLONASE) 50 MCG/ACT nasal spray, Place 2 sprays into both nostrils daily., Disp: 16 g, Rfl: 5   furosemide (LASIX) 20 MG tablet, Take 1 tablet (  20 mg total) by mouth daily., Disp: 60 tablet, Rfl: 0   lansoprazole (PREVACID) 30 MG capsule, Take 1 capsule (30 mg total) by mouth daily., Disp: 30 capsule, Rfl: 1   lidocaine-prilocaine (EMLA) cream, Apply 1 Application topically as needed. Apply small amount to port site at least 1 hour prior to it being accessed, cover with plastic wrap, Disp: 30 g, Rfl: 1   losartan (COZAAR) 25 MG tablet, Take 25 mg by mouth daily., Disp: , Rfl:    metFORMIN (GLUCOPHAGE-XR) 500 MG 24 hr tablet, Take 500 mg by mouth daily with breakfast., Disp: , Rfl:    mirtazapine (REMERON) 15 MG tablet, Take 15 mg by mouth at bedtime., Disp: , Rfl:    oxycodone (ROXICODONE) 30 MG immediate release tablet, Take 1 tablet (30 mg total) by mouth every 4 (four) hours as needed for pain., Disp: 180 tablet, Rfl: 0   potassium chloride SA (KLOR-CON M) 20 MEQ tablet, Take 2 tablets (40 mEq total) by mouth daily., Disp: 7 tablet, Rfl: 0   naloxone (NARCAN) nasal  spray 4 mg/0.1 mL, , Disp: , Rfl:    polyethylene glycol (MIRALAX / GLYCOLAX) 17 g packet, Take 17 g by mouth daily. (Patient not taking: Reported on 12/13/2022), Disp: , Rfl:    traZODone (DESYREL) 50 MG tablet, Take by mouth. (Patient not taking: Reported on 10/11/2022), Disp: , Rfl:  No current facility-administered medications for this visit.  Facility-Administered Medications Ordered in Other Visits:    sodium chloride flush (NS) 0.9 % injection 10 mL, 10 mL, Intravenous, PRN, Sindy Guadeloupe, MD, 10 mL at 12/15/20 0850  Physical exam:  Vitals:   12/13/22 0904  BP: (!) 155/85  Pulse: 75  Temp: (!) 97 F (36.1 C)  TempSrc: Tympanic  Weight: 169 lb 6.4 oz (76.8 kg)  Height: 5\' 6"  (1.676 m)   Physical Exam Cardiovascular:     Rate and Rhythm: Normal rate and regular rhythm.     Heart sounds: Normal heart sounds.  Pulmonary:     Effort: Pulmonary effort is normal.     Breath sounds: Normal breath sounds.  Abdominal:     General: Bowel sounds are normal.     Palpations: Abdomen is soft.  Musculoskeletal:     Comments: Bilateral compression stockings in place.  No significant peripheral edema  Lymphadenopathy:     Comments: Palpable left supraclavicular lymph node roughly 2 cm in size overall stable  Skin:    General: Skin is warm and dry.  Neurological:     Mental Status: She is alert and oriented to person, place, and time.         Latest Ref Rng & Units 12/13/2022    8:45 AM  CMP  Glucose 70 - 99 mg/dL 121   BUN 8 - 23 mg/dL 10   Creatinine 0.44 - 1.00 mg/dL 0.56   Sodium 135 - 145 mmol/L 140   Potassium 3.5 - 5.1 mmol/L 3.4   Chloride 98 - 111 mmol/L 109   CO2 22 - 32 mmol/L 23   Calcium 8.9 - 10.3 mg/dL 9.1   Total Protein 6.5 - 8.1 g/dL 6.9   Total Bilirubin 0.3 - 1.2 mg/dL 0.4   Alkaline Phos 38 - 126 U/L 77   AST 15 - 41 U/L 16   ALT 0 - 44 U/L 9       Latest Ref Rng & Units 12/13/2022    8:45 AM  CBC  WBC 4.0 - 10.5 K/uL 3.8  Hemoglobin 12.0 - 15.0  g/dL 10.6   Hematocrit 36.0 - 46.0 % 32.0   Platelets 150 - 400 K/uL 318      Assessment and plan- Patient is a 72 y.o. female with extensive stage small cell lung cancer with liver lymph node and brain metastases she is here for on treatment assessment prior to cycle 7 of lurbinectedin  today  Counts okay to proceed with cycle 7 of lurbinectedin today.  Her white count is 3.8 with ANC of 2.9.  She has mild waxing and waning neutropenia and she does receive Udenyca with each treatment.  I will see her back in 3 weeks for cycle 8.  Plan to repeat scans in March 2024.  Mild hypokalemia: Continue to monitor  Chemo-induced anemia: Stable continue to monitor  History of renal vein thrombosis on Eliquis.   Visit Diagnosis 1. Small cell carcinoma of lung metastatic to liver ([place])   2. Encounter for antineoplastic chemotherapy   3. Hypokalemia   4. Antineoplastic chemotherapy induced anemia      Dr. Randa Evens, MD, MPH [facility] [phone] 12/13/2022 9:24 AM

## 2022-12-14 ENCOUNTER — Other Ambulatory Visit: Payer: Self-pay

## 2022-12-14 DIAGNOSIS — J301 Allergic rhinitis due to pollen: Secondary | ICD-10-CM | POA: Diagnosis not present

## 2022-12-14 DIAGNOSIS — C787 Secondary malignant neoplasm of liver and intrahepatic bile duct: Secondary | ICD-10-CM

## 2022-12-14 DIAGNOSIS — H9311 Tinnitus, right ear: Secondary | ICD-10-CM | POA: Diagnosis not present

## 2022-12-14 DIAGNOSIS — H6981 Other specified disorders of Eustachian tube, right ear: Secondary | ICD-10-CM | POA: Diagnosis not present

## 2022-12-15 ENCOUNTER — Telehealth: Payer: Self-pay | Admitting: *Deleted

## 2022-12-15 ENCOUNTER — Inpatient Hospital Stay: Payer: Medicare HMO | Attending: Oncology

## 2022-12-15 ENCOUNTER — Inpatient Hospital Stay: Payer: Medicare HMO

## 2022-12-15 DIAGNOSIS — E049 Nontoxic goiter, unspecified: Secondary | ICD-10-CM | POA: Insufficient documentation

## 2022-12-15 DIAGNOSIS — T451X5A Adverse effect of antineoplastic and immunosuppressive drugs, initial encounter: Secondary | ICD-10-CM | POA: Insufficient documentation

## 2022-12-15 DIAGNOSIS — C3412 Malignant neoplasm of upper lobe, left bronchus or lung: Secondary | ICD-10-CM | POA: Diagnosis not present

## 2022-12-15 DIAGNOSIS — Z90710 Acquired absence of both cervix and uterus: Secondary | ICD-10-CM | POA: Diagnosis not present

## 2022-12-15 DIAGNOSIS — D6481 Anemia due to antineoplastic chemotherapy: Secondary | ICD-10-CM | POA: Diagnosis not present

## 2022-12-15 DIAGNOSIS — Z79899 Other long term (current) drug therapy: Secondary | ICD-10-CM | POA: Diagnosis not present

## 2022-12-15 DIAGNOSIS — C787 Secondary malignant neoplasm of liver and intrahepatic bile duct: Secondary | ICD-10-CM | POA: Diagnosis not present

## 2022-12-15 DIAGNOSIS — Z86718 Personal history of other venous thrombosis and embolism: Secondary | ICD-10-CM | POA: Insufficient documentation

## 2022-12-15 DIAGNOSIS — Z7901 Long term (current) use of anticoagulants: Secondary | ICD-10-CM | POA: Insufficient documentation

## 2022-12-15 DIAGNOSIS — C770 Secondary and unspecified malignant neoplasm of lymph nodes of head, face and neck: Secondary | ICD-10-CM | POA: Diagnosis not present

## 2022-12-15 DIAGNOSIS — Z5111 Encounter for antineoplastic chemotherapy: Secondary | ICD-10-CM | POA: Insufficient documentation

## 2022-12-15 DIAGNOSIS — Z5189 Encounter for other specified aftercare: Secondary | ICD-10-CM | POA: Diagnosis not present

## 2022-12-15 DIAGNOSIS — Z87891 Personal history of nicotine dependence: Secondary | ICD-10-CM | POA: Diagnosis not present

## 2022-12-15 DIAGNOSIS — C7931 Secondary malignant neoplasm of brain: Secondary | ICD-10-CM | POA: Insufficient documentation

## 2022-12-15 MED ORDER — PEGFILGRASTIM-CBQV 6 MG/0.6ML ~~LOC~~ SOSY
6.0000 mg | PREFILLED_SYRINGE | Freq: Once | SUBCUTANEOUS | Status: AC
  Administered 2022-12-15: 6 mg via SUBCUTANEOUS
  Filled 2022-12-15: qty 0.6

## 2022-12-15 NOTE — Telephone Encounter (Signed)
Pt called the cancer center and said to cancel her transportation and she will come another way. We had 10:30 appt  and she was not here. But on the pt's list of appts said 1:30. She did get inj. Ellen Henri

## 2022-12-15 NOTE — Progress Notes (Signed)
K was 3.4 on 1/30

## 2022-12-16 ENCOUNTER — Encounter: Payer: Self-pay | Admitting: Oncology

## 2022-12-16 ENCOUNTER — Other Ambulatory Visit: Payer: Self-pay

## 2022-12-16 MED ORDER — CIPROFLOXACIN-DEXAMETHASONE 0.3-0.1 % OT SUSP
3.0000 [drp] | Freq: Three times a day (TID) | OTIC | 0 refills | Status: DC
  Filled 2022-12-16: qty 7.5, 3d supply, fill #0

## 2022-12-22 NOTE — Discharge Instructions (Signed)
[facility] DISCHARGE INSTRUCTIONS FOR MYRINGOTOMY AND TUBE INSERTION  [facility] Margaretha Sheffield, M.D.  Diet:   After surgery, the patient should take only liquids and foods as tolerated.  The patient may then have a regular diet after the effects of anesthesia have worn off, usually about four to six hours after surgery.  Activities:   The patient should rest until the effects of anesthesia have worn off.  After this, there are no restrictions on the normal daily activities.  Medications:   You will be given a prescription for antibiotic drops to be used in the ears postoperatively.  It is recommended to use 3 drops 3 times a day for 3 days, then the drops should be saved for possible future use.  The tubes should not cause any discomfort to the patient, but if there is any question, Tylenol should be given according to the instructions for the age of the patient.  Other medications should be continued normally.  Precautions:   Should there be recurrent drainage after the tubes are placed, the drops should be used for approximately 3-4 days.  If it does not clear, you should call the ENT office.  Earplugs:   Earplugs are only needed for those who are going to be submerged under water.  When taking a bath or shower and using a cup or showerhead to rinse hair, it is not necessary to wear earplugs.  These come in a variety of fashions, all of which can be obtained at our office.  However, if one is not able to come by the office, then silicone plugs can be found at most pharmacies.  It is not advised to stick anything in the ear that is not approved as an earplug.  Silly putty is not to be used as an earplug.  Swimming is allowed in patients after ear tubes are inserted, however, they must wear earplugs if they are going to be submerged under water.  For those children who are going to be swimming a lot, it is recommended to use a fitted ear mold, which can be made by  our audiologist.  If discharge is noticed from the ears, this most likely represents an ear infection.  We would recommend getting your eardrops and using them as indicated above.  If it does not clear, then you should call the ENT office.  For follow up, the patient should return to the ENT office three weeks postoperatively and then every six months as required by the doctor.

## 2022-12-26 ENCOUNTER — Encounter: Payer: Self-pay | Admitting: Otolaryngology

## 2022-12-29 ENCOUNTER — Ambulatory Visit
Admission: RE | Admit: 2022-12-29 | Discharge: 2022-12-29 | Disposition: A | Discharge status: Home / Self Care | Payer: Medicare HMO | Attending: Otolaryngology | Admitting: Otolaryngology

## 2022-12-29 ENCOUNTER — Encounter: Payer: Self-pay | Admitting: Otolaryngology

## 2022-12-29 ENCOUNTER — Other Ambulatory Visit: Payer: Self-pay

## 2022-12-29 ENCOUNTER — Ambulatory Visit: Payer: Medicare HMO | Admitting: Anesthesiology

## 2022-12-29 ENCOUNTER — Encounter
Admission: RE | Disposition: A | Discharge status: Home / Self Care | Payer: Self-pay | Source: Home / Self Care | Attending: Otolaryngology

## 2022-12-29 DIAGNOSIS — H6521 Chronic serous otitis media, right ear: Secondary | ICD-10-CM | POA: Insufficient documentation

## 2022-12-29 DIAGNOSIS — E039 Hypothyroidism, unspecified: Secondary | ICD-10-CM | POA: Insufficient documentation

## 2022-12-29 DIAGNOSIS — H6991 Unspecified Eustachian tube disorder, right ear: Secondary | ICD-10-CM | POA: Diagnosis not present

## 2022-12-29 DIAGNOSIS — E119 Type 2 diabetes mellitus without complications: Secondary | ICD-10-CM | POA: Diagnosis not present

## 2022-12-29 DIAGNOSIS — H6981 Other specified disorders of Eustachian tube, right ear: Secondary | ICD-10-CM | POA: Diagnosis not present

## 2022-12-29 DIAGNOSIS — Z7984 Long term (current) use of oral hypoglycemic drugs: Secondary | ICD-10-CM | POA: Insufficient documentation

## 2022-12-29 DIAGNOSIS — I10 Essential (primary) hypertension: Secondary | ICD-10-CM | POA: Diagnosis not present

## 2022-12-29 HISTORY — PX: MYRINGOTOMY WITH TUBE PLACEMENT: SHX5663

## 2022-12-29 LAB — GLUCOSE, CAPILLARY: Glucose-Capillary: 84 mg/dL (ref 70–99)

## 2022-12-29 SURGERY — MYRINGOTOMY WITH TUBE PLACEMENT
Anesthesia: General | Site: Ear | Laterality: Right

## 2022-12-29 MED ORDER — CIPROFLOXACIN-DEXAMETHASONE 0.3-0.1 % OT SUSP
OTIC | Status: DC | PRN
  Administered 2022-12-29: 1 [drp]

## 2022-12-29 MED ORDER — LACTATED RINGERS IV SOLN: INTRAVENOUS | Status: DC

## 2022-12-29 MED ORDER — ACETAMINOPHEN 325 MG PO TABS
650.0000 mg | ORAL_TABLET | Freq: Once | ORAL | Status: AC
  Administered 2022-12-29: 650 mg via ORAL

## 2022-12-29 MED ORDER — KETOROLAC TROMETHAMINE 30 MG/ML IJ SOLN: 30.0000 mg | Freq: Once | INTRAMUSCULAR | Status: DC | PRN

## 2022-12-29 SURGICAL SUPPLY — 11 items
BALL CTTN LRG ABS STRL LF (GAUZE/BANDAGES/DRESSINGS) ×1
BLADE MYR LANCE NRW W/HDL (BLADE) IMPLANT
BLADE MYRINGOTOMY 6 SPEAR HDL (BLADE) ×1 IMPLANT
CANISTER SUCT 1200ML W/VALVE (MISCELLANEOUS) ×1 IMPLANT
COTTONBALL LRG STERILE PKG (GAUZE/BANDAGES/DRESSINGS) ×1 IMPLANT
GLOVE SURG GAMMEX PI TX LF 7.5 (GLOVE) ×1 IMPLANT
STRAP BODY AND KNEE 60X3 (MISCELLANEOUS) ×1 IMPLANT
TOWEL OR 17X26 4PK STRL BLUE (TOWEL DISPOSABLE) ×1 IMPLANT
TUBE EAR ARMSTRONG FL 1.14X4.5 (OTOLOGIC RELATED) ×2 IMPLANT
TUBING CONN 6MMX3.1M (TUBING) ×1
TUBING SUCTION CONN 0.25 STRL (TUBING) ×1 IMPLANT

## 2022-12-29 NOTE — Transfer of Care (Signed)
Immediate Anesthesia Transfer of Care Note  Patient: Doris Johnson  Procedure(s) Performed: MYRINGOTOMY WITH TUBE PLACEMENT (Right: Ear)  Patient Location: PACU  Anesthesia Type:General  Level of Consciousness: awake, alert , and oriented  Airway & Oxygen Therapy: Patient Spontanous Breathing  Post-op Assessment: Report given to RN and Post -op Vital signs reviewed and stable  Post vital signs: Reviewed and stable  Last Vitals:  Vitals Value Taken Time  BP    Temp    Pulse 54 12/29/22 0750  Resp 14 12/29/22 0750  SpO2 96 % 12/29/22 0750  Vitals shown include unvalidated device data.  Last Pain:  Vitals:   12/29/22 0718  TempSrc: Temporal  PainSc: 0-No pain         Complications: No notable events documented.

## 2022-12-29 NOTE — Op Note (Signed)
12/29/2022  7:47 AM    Doris Johnson  [id number]   Pre-Op Dx:  [name] tube dysfunction and chronic serous otitis media eustachian tube dysfunction and chronic serous otitis media on the right side  Post-op Dx: Same   Proc:Bilateral myringotomy with tubes  Surg: Huey Romans  Anes:  General by mask  EBL:  None  Comp:  None  Findings:  Very dry peeling skin in the right ear canal.  A yellow serous fluid was behind the eardrum and was suctioned out once the incision was made.  A short Armstrong 5 tube was placed  Procedure: With the patient in a comfortable supine position, general mask anesthesia was administered.  At an appropriate level, microscope and speculum were used to examine and clean the RIGHT ear canal.  The findings were as described above.  An anterior inferior radial myringotomy incision was sharply executed.  Middle ear contents were suctioned clear.  A PE tube was placed without difficulty.  Ciprodex otic solution was instilled into the external canal, and insufflated into the middle ear.  A cotton ball was placed at the external meatus. Hemostasis was observed.  This side was completed.    Following this  The patient was returned to anesthesia, awakened, and transferred to recovery in stable condition.There were no operative complications.  She tolerated the procedure well.  Dispo:  PACU to home  Plan: Routine drop use and water precautions.  Recheck my officeIn 2 or three weeksWith audiogram.   Huey Romans 7:47 AM 12/29/2022

## 2022-12-29 NOTE — Anesthesia Preprocedure Evaluation (Addendum)
Anesthesia Evaluation  Patient identified by MRN, date of birth, ID band Patient awake    Reviewed: Allergy & Precautions, H&P , NPO status , Patient's Chart, lab work & pertinent test results  Airway Mallampati: II  TM Distance: >3 FB Neck ROM: full    Dental no notable dental hx.    Pulmonary neg pulmonary ROS   Pulmonary exam normal        Cardiovascular hypertension, Normal cardiovascular exam     Neuro/Psych negative neurological ROS  negative psych ROS   GI/Hepatic negative GI ROS, Neg liver ROS,,,  Endo/Other  diabetesHypothyroidism    Renal/GU negative Renal ROS  negative genitourinary   Musculoskeletal   Abdominal   Peds  Hematology  (+) Blood dyscrasia, anemia   Anesthesia Other Findings Extensive stage small cell lung cancer with liver lymph node and brain metastases on treatment. Great vessel compression noted on CT scan. Pt denies SOB or orthopnea.    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   Past Medical History: No date: Cancer ([place]) No date: Cataract No date: Diabetes mellitus without complication (HCC) No date: Hyperlipidemia No date: Hypertension No date: Hypothyroidism     Comment:  doctor's keeping an eye on thyroid  No date: Lung cancer [facility])  Past Surgical History: No date: ABDOMINAL HYSTERECTOMY 09/28/2021: IR CV LINE INJECTION 11/30/2020: PORTA CATH INSERTION; N/A     Comment:  Procedure: PORTA CATH INSERTION;  Surgeon: Algernon Huxley,              MD;  Location: [facility];  Service:               Cardiovascular;  Laterality: N/A; 10/21/2020: VIDEO BRONCHOSCOPY WITH ENDOBRONCHIAL NAVIGATION; N/A     Comment:  Procedure: VIDEO BRONCHOSCOPY WITH ENDOBRONCHIAL               NAVIGATION;  Surgeon: Ottie Glazier, MD;  Location:               [facility] ORS;  Service: Thoracic;  Laterality:  N/A; 10/21/2020: VIDEO BRONCHOSCOPY WITH ENDOBRONCHIAL ULTRASOUND; N/A     Comment:  Procedure: VIDEO BRONCHOSCOPY WITH ENDOBRONCHIAL               ULTRASOUND;  Surgeon: Ottie Glazier, MD;  Location:               [facility] ORS;  Service: Thoracic;  Laterality: N/A;  BMI    Body Mass Index: 27.28 kg/m      Reproductive/Obstetrics negative OB ROS                             Anesthesia Physical Anesthesia Plan  ASA: 3  Anesthesia Plan: General   Post-op Pain Management:    Induction: Inhalational  PONV Risk Score and Plan: Propofol infusion and TIVA  Airway Management Planned: Natural Airway  Additional Equipment:   Intra-op Plan:   Post-operative Plan:   Informed Consent: I have reviewed the patients History and Physical, chart, labs and discussed the procedure including the risks, benefits and alternatives for the proposed anesthesia with the patient or authorized representative who has indicated his/her understanding and acceptance.     Dental Advisory Given  Plan Discussed with: CRNA and Surgeon  Anesthesia Plan Comments:        Anesthesia Quick Evaluation

## 2022-12-29 NOTE — Anesthesia Postprocedure Evaluation (Signed)
Anesthesia Post Note  Patient: Doris Johnson  Procedure(s) Performed: MYRINGOTOMY WITH TUBE PLACEMENT (Right: Ear)  Patient location during evaluation: PACU Anesthesia Type: General Level of consciousness: awake and alert Pain management: pain level controlled Vital Signs Assessment: post-procedure vital signs reviewed and stable Respiratory status: spontaneous breathing, nonlabored ventilation and respiratory function stable Cardiovascular status: blood pressure returned to baseline and stable Postop Assessment: no apparent nausea or vomiting Anesthetic complications: no   There were no known notable events for this encounter.   Last Vitals:  Vitals:   12/29/22 0749 12/29/22 0800  BP: (!) 174/80 (!) 151/70  Pulse: (!) 57 (!) 55  Resp: 15 15  Temp: (!) 36.2 C (!) 36.2 C  SpO2: 96% 96%    Last Pain:  Vitals:   12/29/22 0800  TempSrc:   PainSc: 0-No pain                 [place] Ouch

## 2022-12-29 NOTE — H&P (Signed)
H&P has been reviewed and patient reevaluated, no changes necessary. To be downloaded later.  

## 2022-12-30 ENCOUNTER — Encounter: Payer: Self-pay | Admitting: Otolaryngology

## 2023-01-02 MED FILL — Dexamethasone Sodium Phosphate Inj 100 MG/10ML: INTRAMUSCULAR | Qty: 1 | Status: AC

## 2023-01-03 ENCOUNTER — Inpatient Hospital Stay: Payer: Medicare HMO

## 2023-01-03 ENCOUNTER — Inpatient Hospital Stay (HOSPITAL_BASED_OUTPATIENT_CLINIC_OR_DEPARTMENT_OTHER): Payer: Medicare HMO | Admitting: Oncology

## 2023-01-03 ENCOUNTER — Encounter: Payer: Self-pay | Admitting: Oncology

## 2023-01-03 VITALS — BP 149/75 | HR 58 | Temp 96.2°F | Resp 18 | Ht 66.0 in | Wt 163.2 lb

## 2023-01-03 DIAGNOSIS — C3490 Malignant neoplasm of unspecified part of unspecified bronchus or lung: Secondary | ICD-10-CM

## 2023-01-03 DIAGNOSIS — E049 Nontoxic goiter, unspecified: Secondary | ICD-10-CM | POA: Diagnosis not present

## 2023-01-03 DIAGNOSIS — C3412 Malignant neoplasm of upper lobe, left bronchus or lung: Secondary | ICD-10-CM | POA: Diagnosis not present

## 2023-01-03 DIAGNOSIS — Z5189 Encounter for other specified aftercare: Secondary | ICD-10-CM | POA: Diagnosis not present

## 2023-01-03 DIAGNOSIS — Z5111 Encounter for antineoplastic chemotherapy: Secondary | ICD-10-CM

## 2023-01-03 DIAGNOSIS — C787 Secondary malignant neoplasm of liver and intrahepatic bile duct: Secondary | ICD-10-CM

## 2023-01-03 DIAGNOSIS — T451X5A Adverse effect of antineoplastic and immunosuppressive drugs, initial encounter: Secondary | ICD-10-CM | POA: Diagnosis not present

## 2023-01-03 DIAGNOSIS — D6481 Anemia due to antineoplastic chemotherapy: Secondary | ICD-10-CM | POA: Diagnosis not present

## 2023-01-03 DIAGNOSIS — C7931 Secondary malignant neoplasm of brain: Secondary | ICD-10-CM | POA: Diagnosis not present

## 2023-01-03 DIAGNOSIS — C770 Secondary and unspecified malignant neoplasm of lymph nodes of head, face and neck: Secondary | ICD-10-CM | POA: Diagnosis not present

## 2023-01-03 LAB — CBC WITH DIFFERENTIAL/PLATELET
Abs Immature Granulocytes: 0.01 10*3/uL (ref 0.00–0.07)
Basophils Absolute: 0 10*3/uL (ref 0.0–0.1)
Basophils Relative: 0 %
Eosinophils Absolute: 0 10*3/uL (ref 0.0–0.5)
Eosinophils Relative: 1 %
HCT: 33.5 % — ABNORMAL LOW (ref 36.0–46.0)
Hemoglobin: 10.8 g/dL — ABNORMAL LOW (ref 12.0–15.0)
Immature Granulocytes: 0 %
Lymphocytes Relative: 23 %
Lymphs Abs: 0.6 10*3/uL — ABNORMAL LOW (ref 0.7–4.0)
MCH: 30.9 pg (ref 26.0–34.0)
MCHC: 32.2 g/dL (ref 30.0–36.0)
MCV: 96 fL (ref 80.0–100.0)
Monocytes Absolute: 0.5 10*3/uL (ref 0.1–1.0)
Monocytes Relative: 18 %
Neutro Abs: 1.4 10*3/uL — ABNORMAL LOW (ref 1.7–7.7)
Neutrophils Relative %: 58 %
Platelets: 291 10*3/uL (ref 150–400)
RBC: 3.49 MIL/uL — ABNORMAL LOW (ref 3.87–5.11)
RDW: 15.5 % (ref 11.5–15.5)
WBC: 2.5 10*3/uL — ABNORMAL LOW (ref 4.0–10.5)
nRBC: 0 % (ref 0.0–0.2)

## 2023-01-03 LAB — COMPREHENSIVE METABOLIC PANEL
ALT: 10 U/L (ref 0–44)
AST: 14 U/L — ABNORMAL LOW (ref 15–41)
Albumin: 4 g/dL (ref 3.5–5.0)
Alkaline Phosphatase: 73 U/L (ref 38–126)
Anion gap: 8 (ref 5–15)
BUN: 10 mg/dL (ref 8–23)
CO2: 22 mmol/L (ref 22–32)
Calcium: 9.4 mg/dL (ref 8.9–10.3)
Chloride: 110 mmol/L (ref 98–111)
Creatinine, Ser: 0.57 mg/dL (ref 0.44–1.00)
GFR, Estimated: >60 mL/min (ref >60)
Glucose, Bld: 91 mg/dL (ref 70–99)
Potassium: 3.7 mmol/L (ref 3.5–5.1)
Sodium: 140 mmol/L (ref 135–145)
Total Bilirubin: 0.8 mg/dL (ref 0.3–1.2)
Total Protein: 7.1 g/dL (ref 6.5–8.1)

## 2023-01-03 MED ORDER — HEPARIN SOD (PORK) LOCK FLUSH 100 UNIT/ML IV SOLN
500.0000 [IU] | Freq: Once | INTRAVENOUS | Status: AC | PRN
  Administered 2023-01-03: 500 [IU]
  Filled 2023-01-03: qty 5

## 2023-01-03 MED ORDER — SODIUM CHLORIDE 0.9 % IV SOLN
3.2000 mg/m2 | Freq: Once | INTRAVENOUS | Status: AC
  Administered 2023-01-03: 5.75 mg via INTRAVENOUS
  Filled 2023-01-03: qty 11.5

## 2023-01-03 MED ORDER — PALONOSETRON HCL INJECTION 0.25 MG/5ML
0.2500 mg | Freq: Once | INTRAVENOUS | Status: AC
  Administered 2023-01-03: 0.25 mg via INTRAVENOUS
  Filled 2023-01-03: qty 5

## 2023-01-03 MED ORDER — SODIUM CHLORIDE 0.9 % IV SOLN
Freq: Once | INTRAVENOUS | Status: AC
  Filled 2023-01-03: qty 250

## 2023-01-03 MED ORDER — SODIUM CHLORIDE 0.9 % IV SOLN
10.0000 mg | Freq: Once | INTRAVENOUS | Status: AC
  Administered 2023-01-03: 10 mg via INTRAVENOUS
  Filled 2023-01-03: qty 10

## 2023-01-03 NOTE — Patient Instructions (Signed)
[facility]  Discharge Instructions: Thank you for choosing [facility] to provide your oncology and hematology care.  If you have a lab appointment with the [facility], please go directly to the [facility] and check in at the registration area.  Wear comfortable clothing and clothing appropriate for easy access to any Portacath or PICC line.   We strive to give you quality time with your provider. You may need to reschedule your appointment if you arrive late (15 or more minutes).  Arriving late affects you and other patients whose appointments are after yours.  Also, if you miss three or more appointments without notifying the office, you may be dismissed from the clinic at the provider's discretion.      For prescription refill requests, have your pharmacy contact our office and allow 72 hours for refills to be completed.     To help prevent nausea and vomiting after your treatment, we encourage you to take your nausea medication as directed.  BELOW ARE SYMPTOMS THAT SHOULD BE REPORTED IMMEDIATELY: *FEVER GREATER THAN 100.4 F (38 C) OR HIGHER *CHILLS OR SWEATING *NAUSEA AND VOMITING THAT IS NOT CONTROLLED WITH YOUR NAUSEA MEDICATION *UNUSUAL SHORTNESS OF BREATH *UNUSUAL BRUISING OR BLEEDING *URINARY PROBLEMS (pain or burning when urinating, or frequent urination) *BOWEL PROBLEMS (unusual diarrhea, constipation, pain near the anus) TENDERNESS IN MOUTH AND THROAT WITH OR WITHOUT PRESENCE OF ULCERS (sore throat, sores in mouth, or a toothache) UNUSUAL RASH, SWELLING OR PAIN  UNUSUAL VAGINAL DISCHARGE OR ITCHING   Items with * indicate a potential emergency and should be followed up as soon as possible or go to the Emergency Department if any problems should occur.  Please show the CHEMOTHERAPY ALERT CARD or IMMUNOTHERAPY ALERT CARD at check-in to the Emergency Department and triage nurse.  Should you have questions after your visit  or need to cancel or reschedule your appointment, please contact [facility]  [phone] and follow the prompts.  Office hours are 8:00 a.m. to 4:30 p.m. Monday - Friday. Please note that voicemails left after 4:00 p.m. may not be returned until the following business day.  We are closed weekends and major holidays. You have access to a nurse at all times for urgent questions. Please call the main number to the clinic [phone] and follow the prompts.  For any non-urgent questions, you may also contact your provider using MyChart. We now offer e-Visits for anyone 43 and older to request care online for non-urgent symptoms. For details visit [email or web address].   Also download the MyChart app! Go to the app store, search "MyChart", open the app, select [facility], and log in with your MyChart username and password.

## 2023-01-03 NOTE — Progress Notes (Signed)
Hematology/Oncology Consult note [facility]  Telephone:([phone] [phone]  Patient Care Team: Leonel Ramsay, MD as PCP - General (Infectious Diseases) Telford Nab, RN as Oncology Nurse Navigator Sindy Guadeloupe, MD as Consulting Physician (Hematology and Oncology)   Name of the patient: Doris Johnson  [id number]  02-19-1951   Date of visit: 01/03/23  Diagnosis- extensive stage small cell lung cancer with liver lymph node and brain metastases    Chief complaint/ Reason for visit-on treatment assessment prior to cycle 8 of lurbinectedin  Heme/Onc history: Patient is a 72 year old female with a remote history of smoking in her teenage years and exposure to passive smoking.  She has been having ongoing midsternal pain which has been growing since the last 6 to 7 months.  She had been to urgent care as well in the past.  She finally had a CT chest with contrast done in October 2021 which showed a large mass in the left side of the mediastinum measuring 6.7 x 6.2 x 6.1 cm causing severe compression of the left main pulmonary artery and around the aortic arch in the left subclavian artery.  Enlarged thyroid gland.  Left upper lobe nodule 1 x 0.7 cm.  She then underwent bronchoscopy with Dr.Aleskerov left upper lobe biopsy was nondiagnostic.  However station 4, 7 and 10 L lymph nodes were consistent with metastatic small cell carcinoma.   PET CT scan showed enlarged level 3 cervical lymph node 2.2 cm that was hypermetabolic with an SUV of 9.7.  Large central 7.3 cm AP window mass.  5.7 cm left liver mass.  MRI brain with and without contrast showed 2 small foci of enhancement in the right cerebellar hemisphere and right temporal lobe without mass-effect or edema as well concerning for brain metastases   Patient had 2 cycles of carbo etoposide chemotherapy along with Tecentriq and subsequent scan showed no significant improvement in the primary lung  mass or liver mass.  She received radiation treatment to her primary lung mass and also seen by [facility] for second opinion.  Her pathology was reviewed at [facility] and reported as possible neuroendocrine neoplasm But stated that small cell carcinoma cannot be excluded but not definitely diagnostic for it.  However [facility] pathology feels that this is consistent with small cell lung cancer.  She has completed 4 cycles of carbo etoposide Tecentriq chemotherapy and is currently on maintenance Tecentriq.  Repeat liver biopsy was also performed and was consistent with small cell lung cancer   Disease progression after 23 cycles of maintenance Tecentriq in the left supraclavicular lymph node region.  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  Plan to switch patient to second line lurbinectedin  Interval history-she is tolerating lurbinectedin well without any nausea vomiting diarrhea or other side effects.  Denies any pain presently.  ECOG PS- 1 Pain scale- 0 Opioid associated constipation- no  Review of systems- Review of Systems  Constitutional:  Negative for chills, fever, malaise/fatigue and weight loss.  HENT:  Negative for congestion, ear discharge and nosebleeds.   Eyes:  Negative for blurred vision.  Respiratory:  Negative for cough, hemoptysis, sputum production, shortness of breath and wheezing.   Cardiovascular:  Negative for chest pain, palpitations, orthopnea and claudication.  Gastrointestinal:  Negative for abdominal pain, blood in stool, constipation, diarrhea, heartburn, melena, nausea and vomiting.  Genitourinary:  Negative for dysuria, flank pain, frequency, hematuria and urgency.  Musculoskeletal:  Negative for back pain, joint pain  and myalgias.  Skin:  Negative for rash.  Neurological:  Negative for dizziness, tingling, focal weakness, seizures, weakness and headaches.  Endo/Heme/Allergies:  Does not bruise/bleed easily.  Psychiatric/Behavioral:   Negative for depression and suicidal ideas. The patient does not have insomnia.       No Known Allergies   Past Medical History:  Diagnosis Date   Cancer ([place])    Cataract    Diabetes mellitus without complication ([place])    Hyperlipidemia    Hypertension    Hypothyroidism    doctor's keeping an eye on thyroid    Lung cancer [facility])      Past Surgical History:  Procedure Laterality Date   ABDOMINAL HYSTERECTOMY     IR CV LINE INJECTION  09/28/2021   MYRINGOTOMY WITH TUBE PLACEMENT Right 12/29/2022   Procedure: MYRINGOTOMY WITH TUBE PLACEMENT;  Surgeon: Margaretha Sheffield, MD;  Location: [facility];  Service: ENT;  Laterality: Right;  Diabetic   PORTA CATH INSERTION N/A 11/30/2020   Procedure: PORTA CATH INSERTION;  Surgeon: Algernon Huxley, MD;  Location: [facility];  Service: Cardiovascular;  Laterality: N/A;   VIDEO BRONCHOSCOPY WITH ENDOBRONCHIAL NAVIGATION N/A 10/21/2020   Procedure: VIDEO BRONCHOSCOPY WITH ENDOBRONCHIAL NAVIGATION;  Surgeon: Ottie Glazier, MD;  Location: [facility] ORS;  Service: Thoracic;  Laterality: N/A;   VIDEO BRONCHOSCOPY WITH ENDOBRONCHIAL ULTRASOUND N/A 10/21/2020   Procedure: VIDEO BRONCHOSCOPY WITH ENDOBRONCHIAL ULTRASOUND;  Surgeon: Ottie Glazier, MD;  Location: [facility] OR[facility];  Service: Thoracic;  Laterality: N/A;    Social History   Socioeconomic History   Marital status: Single    Spouse name: Not on file   Number of children: Not on file   Years of education: Not on file   Highest education level: Not on file  Occupational History   Not on file  Tobacco Use   Smoking status: Never   Smokeless tobacco: Never  Vaping Use   Vaping Use: Never used  Substance and Sexual Activity   Alcohol use: No   Drug use: No   Sexual activity: Not Currently  Other Topics Concern   Not on file  Social History Narrative   Not on file   Social Determinants of Health   Financial Resource Strain: Not on file  Food Insecurity: Food Insecurity  Present (07/08/2021)   Hunger Vital Sign    Worried About Running Out of Food in the Last Year: Sometimes true    Ran Out of Food in the Last Year: Sometimes true  Transportation Needs: Unmet Transportation Needs (01/03/2023)   PRAPARE - Hydrologist (Medical): Yes    Lack of Transportation (Non-Medical): Yes  Physical Activity: Not on file  Stress: Not on file  Social Connections: Not on file  Intimate Partner Violence: Not on file    Family History  Problem Relation Age of Onset   Cancer Mother        breast   Hypertension Mother    Breast cancer Mother 31   Heart disease Father    Hyperlipidemia Brother    Heart disease Brother      Current Outpatient Medications:    ciprofloxacin-dexamethasone (CIPRODEX) OTIC suspension, Place 3 drops into both ears 3 (three) times daily for 5 days. (DOS 12/29/22), Disp: 7.5 mL, Rfl: 0   dexamethasone (DECADRON) 4 MG tablet, Take 1 tablet (4 mg total) by mouth 2 (two) times daily with a meal., Disp: 60 tablet, Rfl: 1   ELIQUIS 5 MG TABS tablet, TAKE  1 TABLET(5 MG) BY MOUTH TWICE DAILY, Disp: 60 tablet, Rfl: 1   fluticasone (FLONASE) 50 MCG/ACT nasal spray, Place 2 sprays into both nostrils daily., Disp: 16 g, Rfl: 5   furosemide (LASIX) 20 MG tablet, Take 1 tablet (20 mg total) by mouth daily., Disp: 60 tablet, Rfl: 0   lansoprazole (PREVACID) 30 MG capsule, Take 1 capsule (30 mg total) by mouth daily., Disp: 30 capsule, Rfl: 1   lidocaine-prilocaine (EMLA) cream, Apply 1 Application topically as needed. Apply small amount to port site at least 1 hour prior to it being accessed, cover with plastic wrap, Disp: 30 g, Rfl: 1   losartan (COZAAR) 25 MG tablet, Take 25 mg by mouth daily., Disp: , Rfl:    metFORMIN (GLUCOPHAGE-XR) 500 MG 24 hr tablet, Take 500 mg by mouth daily with breakfast., Disp: , Rfl:    potassium chloride SA (KLOR-CON M) 20 MEQ tablet, Take 2 tablets (40 mEq total) by mouth daily., Disp: 7 tablet,  Rfl: 0 No current facility-administered medications for this visit.  Facility-Administered Medications Ordered in Other Visits:    sodium chloride flush (NS) 0.9 % injection 10 mL, 10 mL, Intravenous, PRN, Sindy Guadeloupe, MD, 10 mL at 12/15/20 0850  Physical exam:  Vitals:   01/03/23 0928  BP: (!) 149/75  Pulse: (!) 58  Resp: 18  Temp: (!) 96.2 F (35.7 C)  TempSrc: Tympanic  Weight: 163 lb 3.2 oz (74 kg)  Height: 5\' 6"  (1.676 m)   Physical Exam Constitutional:      General: She is not in acute distress. Cardiovascular:     Rate and Rhythm: Normal rate and regular rhythm.     Heart sounds: Normal heart sounds.  Pulmonary:     Effort: Pulmonary effort is normal.     Breath sounds: Normal breath sounds.  Lymphadenopathy:     Comments: Palpable left supraclavicular adenopathy of the somewhat larger as compared to prior visit roughly close to 3 cm  Skin:    General: Skin is warm and dry.  Neurological:     Mental Status: She is alert and oriented to person, place, and time.         Latest Ref Rng & Units 01/03/2023    9:14 AM  CMP  Glucose 70 - 99 mg/dL 91   BUN 8 - 23 mg/dL 10   Creatinine 0.44 - 1.00 mg/dL 0.57   Sodium 135 - 145 mmol/L 140   Potassium 3.5 - 5.1 mmol/L 3.7   Chloride 98 - 111 mmol/L 110   CO2 22 - 32 mmol/L 22   Calcium 8.9 - 10.3 mg/dL 9.4   Total Protein 6.5 - 8.1 g/dL 7.1   Total Bilirubin 0.3 - 1.2 mg/dL 0.8   Alkaline Phos 38 - 126 U/L 73   AST 15 - 41 U/L 14   ALT 0 - 44 U/L 10       Latest Ref Rng & Units 01/03/2023    9:14 AM  CBC  WBC 4.0 - 10.5 K/uL 2.5   Hemoglobin 12.0 - 15.0 g/dL 10.8   Hematocrit 36.0 - 46.0 % 33.5   Platelets 150 - 400 K/uL 291      Assessment and plan- Patient is a 72 y.o. female with extensive stage small cell lung cancer with liver lymph node and brain metastases.  She is here for on treatment assessment prior to cycle 8 of lurbinectedin  White cell count is 2.3 today but ANC remains more than 1  presently at 1.4.  Therefore okay to proceed with lurbinectedin today and Udenyca on day 3.  I will see her back in 6 weeks for cycle 10 and she will be seen by covering NP in 3 weeks for cycle 9.  She will be getting repeat CT chest abdomen and pelvis with contrast and MRI brain with and without contrast in 5 weeks.  History of renal vein thrombosis: Continue Eliquis.  Next chemo-induced anemia: Stable continue to monitor   Visit Diagnosis 1. Small cell carcinoma of lung metastatic to liver ([place])   2. Encounter for antineoplastic chemotherapy      Dr. Randa Evens, MD, MPH [facility] at [facility] [phone] 01/03/2023 1:06 PM

## 2023-01-05 ENCOUNTER — Other Ambulatory Visit: Payer: Self-pay

## 2023-01-05 ENCOUNTER — Inpatient Hospital Stay: Payer: Medicare HMO

## 2023-01-05 DIAGNOSIS — C770 Secondary and unspecified malignant neoplasm of lymph nodes of head, face and neck: Secondary | ICD-10-CM | POA: Diagnosis not present

## 2023-01-05 DIAGNOSIS — E049 Nontoxic goiter, unspecified: Secondary | ICD-10-CM | POA: Diagnosis not present

## 2023-01-05 DIAGNOSIS — C7931 Secondary malignant neoplasm of brain: Secondary | ICD-10-CM | POA: Diagnosis not present

## 2023-01-05 DIAGNOSIS — C3412 Malignant neoplasm of upper lobe, left bronchus or lung: Secondary | ICD-10-CM | POA: Diagnosis not present

## 2023-01-05 DIAGNOSIS — Z5189 Encounter for other specified aftercare: Secondary | ICD-10-CM | POA: Diagnosis not present

## 2023-01-05 DIAGNOSIS — T451X5A Adverse effect of antineoplastic and immunosuppressive drugs, initial encounter: Secondary | ICD-10-CM | POA: Diagnosis not present

## 2023-01-05 DIAGNOSIS — C787 Secondary malignant neoplasm of liver and intrahepatic bile duct: Secondary | ICD-10-CM

## 2023-01-05 DIAGNOSIS — D6481 Anemia due to antineoplastic chemotherapy: Secondary | ICD-10-CM | POA: Diagnosis not present

## 2023-01-05 DIAGNOSIS — Z5111 Encounter for antineoplastic chemotherapy: Secondary | ICD-10-CM | POA: Diagnosis not present

## 2023-01-05 MED ORDER — PEGFILGRASTIM-CBQV 6 MG/0.6ML ~~LOC~~ SOSY
6.0000 mg | PREFILLED_SYRINGE | Freq: Once | SUBCUTANEOUS | Status: AC
  Administered 2023-01-05: 6 mg via SUBCUTANEOUS
  Filled 2023-01-05: qty 0.6

## 2023-01-16 DIAGNOSIS — H903 Sensorineural hearing loss, bilateral: Secondary | ICD-10-CM | POA: Diagnosis not present

## 2023-01-16 DIAGNOSIS — H6981 Other specified disorders of Eustachian tube, right ear: Secondary | ICD-10-CM | POA: Diagnosis not present

## 2023-01-23 ENCOUNTER — Ambulatory Visit: Payer: Medicare HMO | Admitting: Radiation Oncology

## 2023-01-24 ENCOUNTER — Inpatient Hospital Stay: Payer: Medicare HMO | Attending: Oncology | Admitting: Nurse Practitioner

## 2023-01-24 ENCOUNTER — Inpatient Hospital Stay: Payer: Medicare HMO

## 2023-01-24 ENCOUNTER — Encounter: Payer: Self-pay | Admitting: Nurse Practitioner

## 2023-01-24 VITALS — BP 151/70 | HR 55 | Temp 98.2°F | Wt 165.0 lb

## 2023-01-24 DIAGNOSIS — Z8249 Family history of ischemic heart disease and other diseases of the circulatory system: Secondary | ICD-10-CM | POA: Insufficient documentation

## 2023-01-24 DIAGNOSIS — C779 Secondary and unspecified malignant neoplasm of lymph node, unspecified: Secondary | ICD-10-CM | POA: Diagnosis not present

## 2023-01-24 DIAGNOSIS — C787 Secondary malignant neoplasm of liver and intrahepatic bile duct: Secondary | ICD-10-CM | POA: Diagnosis not present

## 2023-01-24 DIAGNOSIS — C3412 Malignant neoplasm of upper lobe, left bronchus or lung: Secondary | ICD-10-CM | POA: Insufficient documentation

## 2023-01-24 DIAGNOSIS — Z79899 Other long term (current) drug therapy: Secondary | ICD-10-CM | POA: Insufficient documentation

## 2023-01-24 DIAGNOSIS — Z5111 Encounter for antineoplastic chemotherapy: Secondary | ICD-10-CM | POA: Diagnosis not present

## 2023-01-24 DIAGNOSIS — C3490 Malignant neoplasm of unspecified part of unspecified bronchus or lung: Secondary | ICD-10-CM | POA: Diagnosis not present

## 2023-01-24 DIAGNOSIS — Z79630 Long term (current) use of alkylating agent: Secondary | ICD-10-CM | POA: Insufficient documentation

## 2023-01-24 DIAGNOSIS — C7931 Secondary malignant neoplasm of brain: Secondary | ICD-10-CM | POA: Diagnosis not present

## 2023-01-24 DIAGNOSIS — Z5112 Encounter for antineoplastic immunotherapy: Secondary | ICD-10-CM | POA: Insufficient documentation

## 2023-01-24 DIAGNOSIS — Z8349 Family history of other endocrine, nutritional and metabolic diseases: Secondary | ICD-10-CM | POA: Diagnosis not present

## 2023-01-24 DIAGNOSIS — Z803 Family history of malignant neoplasm of breast: Secondary | ICD-10-CM | POA: Insufficient documentation

## 2023-01-24 LAB — COMPREHENSIVE METABOLIC PANEL
ALT: 9 U/L (ref 0–44)
AST: 14 U/L — ABNORMAL LOW (ref 15–41)
Albumin: 3.8 g/dL (ref 3.5–5.0)
Alkaline Phosphatase: 82 U/L (ref 38–126)
Anion gap: 6 (ref 5–15)
BUN: 16 mg/dL (ref 8–23)
CO2: 23 mmol/L (ref 22–32)
Calcium: 9.3 mg/dL (ref 8.9–10.3)
Chloride: 110 mmol/L (ref 98–111)
Creatinine, Ser: 0.66 mg/dL (ref 0.44–1.00)
GFR, Estimated: >60 mL/min (ref >60)
Glucose, Bld: 92 mg/dL (ref 70–99)
Potassium: 3.7 mmol/L (ref 3.5–5.1)
Sodium: 139 mmol/L (ref 135–145)
Total Bilirubin: 0.4 mg/dL (ref 0.3–1.2)
Total Protein: 6.9 g/dL (ref 6.5–8.1)

## 2023-01-24 LAB — CBC WITH DIFFERENTIAL/PLATELET
Abs Immature Granulocytes: 0.01 10*3/uL (ref 0.00–0.07)
Basophils Absolute: 0 10*3/uL (ref 0.0–0.1)
Basophils Relative: 0 %
Eosinophils Absolute: 0 10*3/uL (ref 0.0–0.5)
Eosinophils Relative: 1 %
HCT: 32.6 % — ABNORMAL LOW (ref 36.0–46.0)
Hemoglobin: 10.6 g/dL — ABNORMAL LOW (ref 12.0–15.0)
Immature Granulocytes: 0 %
Lymphocytes Relative: 21 %
Lymphs Abs: 0.6 10*3/uL — ABNORMAL LOW (ref 0.7–4.0)
MCH: 31.3 pg (ref 26.0–34.0)
MCHC: 32.5 g/dL (ref 30.0–36.0)
MCV: 96.2 fL (ref 80.0–100.0)
Monocytes Absolute: 0.6 10*3/uL (ref 0.1–1.0)
Monocytes Relative: 19 %
Neutro Abs: 1.8 10*3/uL (ref 1.7–7.7)
Neutrophils Relative %: 59 %
Platelets: 318 10*3/uL (ref 150–400)
RBC: 3.39 MIL/uL — ABNORMAL LOW (ref 3.87–5.11)
RDW: 15.9 % — ABNORMAL HIGH (ref 11.5–15.5)
WBC: 3.1 10*3/uL — ABNORMAL LOW (ref 4.0–10.5)
nRBC: 0 % (ref 0.0–0.2)

## 2023-01-24 MED ORDER — SODIUM CHLORIDE 0.9 % IV SOLN
3.2000 mg/m2 | Freq: Once | INTRAVENOUS | Status: AC
  Administered 2023-01-24: 5.75 mg via INTRAVENOUS
  Filled 2023-01-24: qty 11.5

## 2023-01-24 MED ORDER — SODIUM CHLORIDE 0.9 % IV SOLN
10.0000 mg | Freq: Once | INTRAVENOUS | Status: AC
  Administered 2023-01-24: 10 mg via INTRAVENOUS
  Filled 2023-01-24: qty 10

## 2023-01-24 MED ORDER — HEPARIN SOD (PORK) LOCK FLUSH 100 UNIT/ML IV SOLN
500.0000 [IU] | Freq: Once | INTRAVENOUS | Status: AC | PRN
  Administered 2023-01-24: 500 [IU]
  Filled 2023-01-24: qty 5

## 2023-01-24 MED ORDER — PALONOSETRON HCL INJECTION 0.25 MG/5ML
0.2500 mg | Freq: Once | INTRAVENOUS | Status: AC
  Administered 2023-01-24: 0.25 mg via INTRAVENOUS
  Filled 2023-01-24: qty 5

## 2023-01-24 MED ORDER — SODIUM CHLORIDE 0.9 % IV SOLN
Freq: Once | INTRAVENOUS | Status: AC
  Filled 2023-01-24: qty 250

## 2023-01-24 NOTE — Progress Notes (Signed)
Hematology/Oncology Consult Note [facility]  [phone] [phone]  Patient Care Team: Mick Sell, MD as PCP - General (Infectious Diseases) Glory Buff, RN as Oncology Nurse Navigator Creig Hines, MD as Consulting Physician (Hematology and Oncology)   Name of the patient: Doris Johnson  [id number]  01-22-1951   Date of visit: 01/24/23  Diagnosis- extensive stage small cell lung cancer with liver lymph node and brain metastases    Chief complaint/ Reason for visit-on treatment assessment prior to cycle 9 of lurbinectedin  Heme/Onc history: Patient is a 72 year old female with a remote history of smoking in her teenage years and exposure to passive smoking.  She has been having ongoing midsternal pain which has been growing since the last 6 to 7 months.  She had been to urgent care as well in the past.  She finally had a CT chest with contrast done in October 2021 which showed a large mass in the left side of the mediastinum measuring 6.7 x 6.2 x 6.1 cm causing severe compression of the left main pulmonary artery and around the aortic arch in the left subclavian artery.  Enlarged thyroid gland.  Left upper lobe nodule 1 x 0.7 cm.  She then underwent bronchoscopy with Dr.Aleskerov left upper lobe biopsy was nondiagnostic.  However station 4, 7 and 10 L lymph nodes were consistent with metastatic small cell carcinoma.   PET CT scan showed enlarged level 3 cervical lymph node 2.2 cm that was hypermetabolic with an SUV of 9.7.  Large central 7.3 cm AP window mass.  5.7 cm left liver mass.  MRI brain with and without contrast showed 2 small foci of enhancement in the right cerebellar hemisphere and right temporal lobe without mass-effect or edema as well concerning for brain metastases   Patient had 2 cycles of carbo etoposide chemotherapy along with Tecentriq and subsequent scan showed no significant improvement in the primary lung mass or  liver mass.  She received radiation treatment to her primary lung mass and also seen by [facility] for second opinion.  Her pathology was reviewed at [facility] and reported as possible neuroendocrine neoplasm But stated that small cell carcinoma cannot be excluded but not definitely diagnostic for it.  However [facility] pathology feels that this is consistent with small cell lung cancer.  She has completed 4 cycles of carbo etoposide Tecentriq chemotherapy and is currently on maintenance Tecentriq.  Repeat liver biopsy was also performed and was consistent with small cell lung cancer   Disease progression after 23 cycles of maintenance Tecentriq in the left supraclavicular lymph node region.  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  Plan to switch patient to second line lurbinectedin  Interval history- Continues to tolerate lurbinectedin well. No interval infections. No nausea, vomiting, abdominal pain, constipation, or diarrhea. No increased cough, shortness of breath. She is fatigued which is relatively unchanged. Weight and appetite are stable.   ECOG PS- 1 Pain scale- 0 Opioid associated constipation- no  Review of systems- Review of Systems  Constitutional:  Negative for chills, fever, malaise/fatigue and weight loss.  HENT:  Negative for congestion, ear discharge and nosebleeds.   Eyes:  Negative for blurred vision.  Respiratory:  Negative for cough, hemoptysis, sputum production, shortness of breath and wheezing.   Cardiovascular:  Negative for chest pain, palpitations, orthopnea and claudication.  Gastrointestinal:  Negative for abdominal pain, blood in stool, constipation, diarrhea, heartburn, melena, nausea and vomiting.  Genitourinary:  Negative for dysuria,  flank pain, frequency, hematuria and urgency.  Musculoskeletal:  Negative for back pain, joint pain and myalgias.  Skin:  Negative for rash.  Neurological:  Negative for dizziness, tingling, focal weakness,  seizures, weakness and headaches.  Endo/Heme/Allergies:  Does not bruise/bleed easily.  Psychiatric/Behavioral:  Negative for depression and suicidal ideas. The patient does not have insomnia.     No Known Allergies  Past Medical History:  Diagnosis Date   Cancer (HCC)    Cataract    Diabetes mellitus without complication (HCC)    Hyperlipidemia    Hypertension    Hypothyroidism    doctor's keeping an eye on thyroid    Lung cancer [facility])      Past Surgical History:  Procedure Laterality Date   ABDOMINAL HYSTERECTOMY     IR CV LINE INJECTION  09/28/2021   MYRINGOTOMY WITH TUBE PLACEMENT Right 12/29/2022   Procedure: MYRINGOTOMY WITH TUBE PLACEMENT;  Surgeon: Vernie Murders, MD;  Location: [facility];  Service: ENT;  Laterality: Right;  Diabetic   PORTA CATH INSERTION N/A 11/30/2020   Procedure: PORTA CATH INSERTION;  Surgeon: Annice Needy, MD;  Location: [facility] INVASIVE CV LAB;  Service: Cardiovascular;  Laterality: N/A;   VIDEO BRONCHOSCOPY WITH ENDOBRONCHIAL NAVIGATION N/A 10/21/2020   Procedure: VIDEO BRONCHOSCOPY WITH ENDOBRONCHIAL NAVIGATION;  Surgeon: Vida Rigger, MD;  Location: [facility] ORS;  Service: Thoracic;  Laterality: N/A;   VIDEO BRONCHOSCOPY WITH ENDOBRONCHIAL ULTRASOUND N/A 10/21/2020   Procedure: VIDEO BRONCHOSCOPY WITH ENDOBRONCHIAL ULTRASOUND;  Surgeon: Vida Rigger, MD;  Location: [facility] ORS;  Service: Thoracic;  Laterality: N/A;    Social History   Socioeconomic History   Marital status: Single    Spouse name: Not on file   Number of children: Not on file   Years of education: Not on file   Highest education level: Not on file  Occupational History   Not on file  Tobacco Use   Smoking status: Never   Smokeless tobacco: Never  Vaping Use   Vaping Use: Never used  Substance and Sexual Activity   Alcohol use: No   Drug use: No   Sexual activity: Not Currently  Other Topics Concern   Not on file  Social History Narrative   Not on file    Social Determinants of Health   Financial Resource Strain: Not on file  Food Insecurity: Food Insecurity Present (07/08/2021)   Hunger Vital Sign    Worried About Running Out of Food in the Last Year: Sometimes true    Ran Out of Food in the Last Year: Sometimes true  Transportation Needs: Unmet Transportation Needs (01/24/2023)   PRAPARE - Administrator, Civil Service (Medical): Yes    Lack of Transportation (Non-Medical): Yes  Physical Activity: Not on file  Stress: Not on file  Social Connections: Not on file  Intimate Partner Violence: Not on file    Family History  Problem Relation Age of Onset   Cancer Mother        breast   Hypertension Mother    Breast cancer Mother 43   Heart disease Father    Hyperlipidemia Brother    Heart disease Brother      Current Outpatient Medications:    ciprofloxacin-dexamethasone (CIPRODEX) OTIC suspension, Place 3 drops into both ears 3 (three) times daily for 5 days. (DOS 12/29/22), Disp: 7.5 mL, Rfl: 0   dexamethasone (DECADRON) 4 MG tablet, Take 1 tablet (4 mg total) by mouth 2 (two) times daily with a meal., Disp:  60 tablet, Rfl: 1   ELIQUIS 5 MG TABS tablet, TAKE 1 TABLET(5 MG) BY MOUTH TWICE DAILY, Disp: 60 tablet, Rfl: 1   fluticasone (FLONASE) 50 MCG/ACT nasal spray, Place 2 sprays into both nostrils daily., Disp: 16 g, Rfl: 5   furosemide (LASIX) 20 MG tablet, Take 1 tablet (20 mg total) by mouth daily., Disp: 60 tablet, Rfl: 0   lansoprazole (PREVACID) 30 MG capsule, Take 1 capsule (30 mg total) by mouth daily., Disp: 30 capsule, Rfl: 1   lidocaine-prilocaine (EMLA) cream, Apply 1 Application topically as needed. Apply small amount to port site at least 1 hour prior to it being accessed, cover with plastic wrap, Disp: 30 g, Rfl: 1   losartan (COZAAR) 25 MG tablet, Take 25 mg by mouth daily., Disp: , Rfl:    metFORMIN (GLUCOPHAGE-XR) 500 MG 24 hr tablet, Take 500 mg by mouth daily with breakfast., Disp: , Rfl:     potassium chloride SA (KLOR-CON M) 20 MEQ tablet, Take 2 tablets (40 mEq total) by mouth daily., Disp: 7 tablet, Rfl: 0 No current facility-administered medications for this visit.  Facility-Administered Medications Ordered in Other Visits:    sodium chloride flush (NS) 0.9 % injection 10 mL, 10 mL, Intravenous, PRN, Creig Hines, MD, 10 mL at 12/15/20 0850  Physical exam:  Vitals:   01/24/23 0952  BP: (!) 151/70  Pulse: (!) 55  Temp: 98.2 F (36.8 C)  TempSrc: Tympanic  SpO2: 100%  Weight: 165 lb (74.8 kg)   Physical Exam Vitals reviewed.  Constitutional:      General: She is not in acute distress.    Appearance: She is not ill-appearing.  Eyes:     General: No scleral icterus. Cardiovascular:     Rate and Rhythm: Normal rate and regular rhythm.  Pulmonary:     Effort: Pulmonary effort is normal.  Abdominal:     General: There is no distension.     Tenderness: There is no abdominal tenderness. There is no guarding.  Lymphadenopathy:     Comments: Left supraclavicular lymph node roughly 1 cm. Nontender. Firm. No other palpable adenopathy.   Skin:    General: Skin is warm and dry.     Coloration: Skin is not pale.     Findings: No rash.  Neurological:     Mental Status: She is alert and oriented to person, place, and time.  Psychiatric:        Mood and Affect: Mood normal.        Behavior: Behavior normal.         Latest Ref Rng & Units 01/03/2023    9:14 AM  CMP  Glucose 70 - 99 mg/dL 91   BUN 8 - 23 mg/dL 10   Creatinine 2.44 - 1.00 mg/dL 0.10   Sodium 272 - 536 mmol/L 140   Potassium 3.5 - 5.1 mmol/L 3.7   Chloride 98 - 111 mmol/L 110   CO2 22 - 32 mmol/L 22   Calcium 8.9 - 10.3 mg/dL 9.4   Total Protein 6.5 - 8.1 g/dL 7.1   Total Bilirubin 0.3 - 1.2 mg/dL 0.8   Alkaline Phos 38 - 126 U/L 73   AST 15 - 41 U/L 14   ALT 0 - 44 U/L 10       Latest Ref Rng & Units 01/24/2023    9:42 AM  CBC  WBC 4.0 - 10.5 K/uL 3.1   Hemoglobin 12.0 - 15.0 g/dL 64.4    Hematocrit  36.0 - 46.0 % 32.6   Platelets 150 - 400 K/uL 318      Assessment and plan- Patient is a 72 y.o. female   Extensive stage small cell lung cancer with liver lymph node and brain metastases- currently receiving lurbinectedin. Tolerating well. Labs reviewed and acceptable for continuation of treatment. Proceed with cycle 9 of lurbinectedin today. She will have restaging scans on 02/08/23 with ct chest abdomen pelvis. Will get MRI brain ordered as well. Clinically asymptomatic.  Neutropenia- wbc 3.1. anc 1.8. she will receive udenyca/gcsf on day 3 for prevention of febrile neutropenias. Reviewed use of tylenol and claritin for myalgias and arthralgias.  History of renal vein thrombosis- continue eliquis.  Chemotherapy induced anemia- hemoglobin 10.6. stable. Monitor.   Disposition:  MRI Brain prior to next cycle Rtc as scheduled for discussion of imaging results and consideration of cycle 10 with Dr. Smith Robert. (Per IS)- la    Visit Diagnosis 1. Encounter for antineoplastic chemotherapy   2. Small cell carcinoma of lung metastatic to liver (HCC)    Consuello Masse, DNP, AGNP-C, AOCNP [facility] [phone] (clinic) 01/24/2023

## 2023-01-24 NOTE — Patient Instructions (Addendum)
[facility]  Discharge Instructions: Thank you for choosing [facility] to provide your oncology and hematology care.   If you have a lab appointment with the [facility], please go directly to the [facility] and check in at the registration area.   Wear comfortable clothing and clothing appropriate for easy access to any Portacath or PICC line.   We strive to give you quality time with your provider. You may need to reschedule your appointment if you arrive late (15 or more minutes).  Arriving late affects you and other patients whose appointments are after yours.  Also, if you miss three or more appointments without notifying the office, you may be dismissed from the clinic at the provider's discretion.      For prescription refill requests, have your pharmacy contact our office and allow 72 hours for refills to be completed.    Today you received the following chemotherapy and/or immunotherapy agents: lurbinectin      To help prevent nausea and vomiting after your treatment, we encourage you to take your nausea medication as directed.  BELOW ARE SYMPTOMS THAT SHOULD BE REPORTED IMMEDIATELY: *FEVER GREATER THAN 100.4 F (38 C) OR HIGHER *CHILLS OR SWEATING *NAUSEA AND VOMITING THAT IS NOT CONTROLLED WITH YOUR NAUSEA MEDICATION *UNUSUAL SHORTNESS OF BREATH *UNUSUAL BRUISING OR BLEEDING *URINARY PROBLEMS (pain or burning when urinating, or frequent urination) *BOWEL PROBLEMS (unusual diarrhea, constipation, pain near the anus) TENDERNESS IN MOUTH AND THROAT WITH OR WITHOUT PRESENCE OF ULCERS (sore throat, sores in mouth, or a toothache) UNUSUAL RASH, SWELLING OR PAIN  UNUSUAL VAGINAL DISCHARGE OR ITCHING   Items with * indicate a potential emergency and should be followed up as soon as possible or go to the Emergency Department if any problems should occur.  Please show the CHEMOTHERAPY ALERT CARD or IMMUNOTHERAPY ALERT CARD at  check-in to the Emergency Department and triage nurse.  Should you have questions after your visit or need to cancel or reschedule your appointment, please contact [facility]  Dept: [phone]  and follow the prompts.  Office hours are 8:00 a.m. to 4:30 p.m. Monday - Friday. Please note that voicemails left after 4:00 p.m. may not be returned until the following business day.  We are closed weekends and major holidays. You have access to a nurse at all times for urgent questions. Please call the main number to the clinic Dept: [phone] and follow the prompts.   For any non-urgent questions, you may also contact your provider using MyChart. We now offer e-Visits for anyone 2 and older to request care online for non-urgent symptoms. For details visit [email or web address].   Also download the MyChart app! Go to the app store, search "MyChart", open the app, select [facility], and log in with your MyChart username and password.

## 2023-01-26 ENCOUNTER — Inpatient Hospital Stay: Payer: Medicare HMO

## 2023-01-26 DIAGNOSIS — C3412 Malignant neoplasm of upper lobe, left bronchus or lung: Secondary | ICD-10-CM | POA: Diagnosis not present

## 2023-01-26 DIAGNOSIS — Z79899 Other long term (current) drug therapy: Secondary | ICD-10-CM | POA: Diagnosis not present

## 2023-01-26 DIAGNOSIS — C787 Secondary malignant neoplasm of liver and intrahepatic bile duct: Secondary | ICD-10-CM | POA: Diagnosis not present

## 2023-01-26 DIAGNOSIS — Z803 Family history of malignant neoplasm of breast: Secondary | ICD-10-CM | POA: Diagnosis not present

## 2023-01-26 DIAGNOSIS — Z79630 Long term (current) use of alkylating agent: Secondary | ICD-10-CM | POA: Diagnosis not present

## 2023-01-26 DIAGNOSIS — C779 Secondary and unspecified malignant neoplasm of lymph node, unspecified: Secondary | ICD-10-CM | POA: Diagnosis not present

## 2023-01-26 DIAGNOSIS — C7931 Secondary malignant neoplasm of brain: Secondary | ICD-10-CM | POA: Diagnosis not present

## 2023-01-26 DIAGNOSIS — Z5112 Encounter for antineoplastic immunotherapy: Secondary | ICD-10-CM | POA: Diagnosis not present

## 2023-01-26 DIAGNOSIS — Z8249 Family history of ischemic heart disease and other diseases of the circulatory system: Secondary | ICD-10-CM | POA: Diagnosis not present

## 2023-01-26 MED ORDER — PEGFILGRASTIM-CBQV 6 MG/0.6ML ~~LOC~~ SOSY
6.0000 mg | PREFILLED_SYRINGE | Freq: Once | SUBCUTANEOUS | Status: AC
  Administered 2023-01-26: 6 mg via SUBCUTANEOUS
  Filled 2023-01-26: qty 0.6

## 2023-02-07 ENCOUNTER — Ambulatory Visit
Admission: RE | Admit: 2023-02-07 | Discharge: 2023-02-07 | Disposition: A | Discharge status: Home / Self Care | Payer: Medicare HMO | Source: Ambulatory Visit | Attending: Oncology | Admitting: Oncology

## 2023-02-07 ENCOUNTER — Inpatient Hospital Stay: Payer: Medicare HMO

## 2023-02-07 DIAGNOSIS — K769 Liver disease, unspecified: Secondary | ICD-10-CM | POA: Diagnosis not present

## 2023-02-07 DIAGNOSIS — G9389 Other specified disorders of brain: Secondary | ICD-10-CM | POA: Diagnosis not present

## 2023-02-07 DIAGNOSIS — C787 Secondary malignant neoplasm of liver and intrahepatic bile duct: Secondary | ICD-10-CM | POA: Insufficient documentation

## 2023-02-07 DIAGNOSIS — C3490 Malignant neoplasm of unspecified part of unspecified bronchus or lung: Secondary | ICD-10-CM | POA: Insufficient documentation

## 2023-02-07 DIAGNOSIS — R918 Other nonspecific abnormal finding of lung field: Secondary | ICD-10-CM | POA: Diagnosis not present

## 2023-02-07 MED ORDER — GADOBUTROL 1 MMOL/ML IV SOLN
7.0000 mL | Freq: Once | INTRAVENOUS | Status: AC | PRN
  Administered 2023-02-07: 7 mL via INTRAVENOUS

## 2023-02-07 MED ORDER — IOHEXOL 300 MG/ML  SOLN
85.0000 mL | Freq: Once | INTRAMUSCULAR | Status: AC | PRN
  Administered 2023-02-07: 85 mL via INTRAVENOUS

## 2023-02-13 MED FILL — Dexamethasone Sodium Phosphate Inj 100 MG/10ML: INTRAMUSCULAR | Qty: 1 | Status: AC

## 2023-02-14 ENCOUNTER — Inpatient Hospital Stay (HOSPITAL_BASED_OUTPATIENT_CLINIC_OR_DEPARTMENT_OTHER): Payer: Medicare HMO | Admitting: Oncology

## 2023-02-14 ENCOUNTER — Encounter: Payer: Self-pay | Admitting: Oncology

## 2023-02-14 ENCOUNTER — Inpatient Hospital Stay: Payer: Medicare HMO

## 2023-02-14 ENCOUNTER — Ambulatory Visit: Payer: Medicare HMO | Attending: Radiation Oncology | Admitting: Radiation Oncology

## 2023-02-14 ENCOUNTER — Inpatient Hospital Stay: Payer: Medicare HMO | Attending: Oncology

## 2023-02-14 VITALS — BP 156/86 | HR 60 | Temp 98.4°F | Ht 65.5 in | Wt 163.5 lb

## 2023-02-14 DIAGNOSIS — C3490 Malignant neoplasm of unspecified part of unspecified bronchus or lung: Secondary | ICD-10-CM

## 2023-02-14 DIAGNOSIS — Z8349 Family history of other endocrine, nutritional and metabolic diseases: Secondary | ICD-10-CM | POA: Insufficient documentation

## 2023-02-14 DIAGNOSIS — Z7901 Long term (current) use of anticoagulants: Secondary | ICD-10-CM | POA: Insufficient documentation

## 2023-02-14 DIAGNOSIS — E041 Nontoxic single thyroid nodule: Secondary | ICD-10-CM | POA: Diagnosis not present

## 2023-02-14 DIAGNOSIS — E119 Type 2 diabetes mellitus without complications: Secondary | ICD-10-CM | POA: Diagnosis not present

## 2023-02-14 DIAGNOSIS — Z86718 Personal history of other venous thrombosis and embolism: Secondary | ICD-10-CM | POA: Insufficient documentation

## 2023-02-14 DIAGNOSIS — D708 Other neutropenia: Secondary | ICD-10-CM | POA: Diagnosis not present

## 2023-02-14 DIAGNOSIS — Z923 Personal history of irradiation: Secondary | ICD-10-CM | POA: Insufficient documentation

## 2023-02-14 DIAGNOSIS — T451X5A Adverse effect of antineoplastic and immunosuppressive drugs, initial encounter: Secondary | ICD-10-CM

## 2023-02-14 DIAGNOSIS — Z5111 Encounter for antineoplastic chemotherapy: Secondary | ICD-10-CM | POA: Diagnosis not present

## 2023-02-14 DIAGNOSIS — Z803 Family history of malignant neoplasm of breast: Secondary | ICD-10-CM | POA: Diagnosis not present

## 2023-02-14 DIAGNOSIS — Z8249 Family history of ischemic heart disease and other diseases of the circulatory system: Secondary | ICD-10-CM | POA: Diagnosis not present

## 2023-02-14 DIAGNOSIS — C7931 Secondary malignant neoplasm of brain: Secondary | ICD-10-CM | POA: Diagnosis not present

## 2023-02-14 DIAGNOSIS — Z90710 Acquired absence of both cervix and uterus: Secondary | ICD-10-CM | POA: Diagnosis not present

## 2023-02-14 DIAGNOSIS — Z5941 Food insecurity: Secondary | ICD-10-CM | POA: Diagnosis not present

## 2023-02-14 DIAGNOSIS — C787 Secondary malignant neoplasm of liver and intrahepatic bile duct: Secondary | ICD-10-CM | POA: Insufficient documentation

## 2023-02-14 DIAGNOSIS — Z7189 Other specified counseling: Secondary | ICD-10-CM | POA: Diagnosis not present

## 2023-02-14 DIAGNOSIS — D701 Agranulocytosis secondary to cancer chemotherapy: Secondary | ICD-10-CM | POA: Diagnosis not present

## 2023-02-14 DIAGNOSIS — Z7722 Contact with and (suspected) exposure to environmental tobacco smoke (acute) (chronic): Secondary | ICD-10-CM | POA: Insufficient documentation

## 2023-02-14 DIAGNOSIS — Z87891 Personal history of nicotine dependence: Secondary | ICD-10-CM | POA: Insufficient documentation

## 2023-02-14 DIAGNOSIS — C3412 Malignant neoplasm of upper lobe, left bronchus or lung: Secondary | ICD-10-CM | POA: Insufficient documentation

## 2023-02-14 DIAGNOSIS — Z79899 Other long term (current) drug therapy: Secondary | ICD-10-CM | POA: Insufficient documentation

## 2023-02-14 DIAGNOSIS — C770 Secondary and unspecified malignant neoplasm of lymph nodes of head, face and neck: Secondary | ICD-10-CM | POA: Diagnosis not present

## 2023-02-14 DIAGNOSIS — Z79630 Long term (current) use of alkylating agent: Secondary | ICD-10-CM | POA: Insufficient documentation

## 2023-02-14 LAB — COMPREHENSIVE METABOLIC PANEL
ALT: 10 U/L (ref 0–44)
AST: 13 U/L — ABNORMAL LOW (ref 15–41)
Albumin: 3.8 g/dL (ref 3.5–5.0)
Alkaline Phosphatase: 73 U/L (ref 38–126)
Anion gap: 4 — ABNORMAL LOW (ref 5–15)
BUN: 13 mg/dL (ref 8–23)
CO2: 26 mmol/L (ref 22–32)
Calcium: 9.3 mg/dL (ref 8.9–10.3)
Chloride: 110 mmol/L (ref 98–111)
Creatinine, Ser: 0.56 mg/dL (ref 0.44–1.00)
GFR, Estimated: >60 mL/min (ref >60)
Glucose, Bld: 91 mg/dL (ref 70–99)
Potassium: 3.4 mmol/L — ABNORMAL LOW (ref 3.5–5.1)
Sodium: 140 mmol/L (ref 135–145)
Total Bilirubin: 0.4 mg/dL (ref 0.3–1.2)
Total Protein: 6.6 g/dL (ref 6.5–8.1)

## 2023-02-14 LAB — CBC WITH DIFFERENTIAL/PLATELET
Abs Immature Granulocytes: 0 10*3/uL (ref 0.00–0.07)
Basophils Absolute: 0 10*3/uL (ref 0.0–0.1)
Basophils Relative: 0 %
Eosinophils Absolute: 0.1 10*3/uL (ref 0.0–0.5)
Eosinophils Relative: 3 %
HCT: 31.4 % — ABNORMAL LOW (ref 36.0–46.0)
Hemoglobin: 10.1 g/dL — ABNORMAL LOW (ref 12.0–15.0)
Immature Granulocytes: 0 %
Lymphocytes Relative: 23 %
Lymphs Abs: 0.5 10*3/uL — ABNORMAL LOW (ref 0.7–4.0)
MCH: 30.7 pg (ref 26.0–34.0)
MCHC: 32.2 g/dL (ref 30.0–36.0)
MCV: 95.4 fL (ref 80.0–100.0)
Monocytes Absolute: 0.4 10*3/uL (ref 0.1–1.0)
Monocytes Relative: 19 %
Neutro Abs: 1.2 10*3/uL — ABNORMAL LOW (ref 1.7–7.7)
Neutrophils Relative %: 55 %
Platelets: 289 10*3/uL (ref 150–400)
RBC: 3.29 MIL/uL — ABNORMAL LOW (ref 3.87–5.11)
RDW: 16.3 % — ABNORMAL HIGH (ref 11.5–15.5)
WBC: 2.3 10*3/uL — ABNORMAL LOW (ref 4.0–10.5)
nRBC: 0 % (ref 0.0–0.2)

## 2023-02-14 MED ORDER — HEPARIN SOD (PORK) LOCK FLUSH 100 UNIT/ML IV SOLN
500.0000 [IU] | Freq: Once | INTRAVENOUS | Status: AC | PRN
  Filled 2023-02-14: qty 5

## 2023-02-14 MED ORDER — HEPARIN SOD (PORK) LOCK FLUSH 100 UNIT/ML IV SOLN
500.0000 [IU] | Freq: Once | INTRAVENOUS | Status: DC
  Filled 2023-02-14: qty 5

## 2023-02-14 MED ORDER — SODIUM CHLORIDE 0.9 % IV SOLN
Freq: Once | INTRAVENOUS | Status: AC
  Filled 2023-02-14: qty 250

## 2023-02-14 MED ORDER — SODIUM CHLORIDE 0.9% FLUSH
10.0000 mL | INTRAVENOUS | Status: DC | PRN
  Administered 2023-02-14: 10 mL via INTRAVENOUS
  Filled 2023-02-14: qty 10

## 2023-02-14 MED ORDER — SODIUM CHLORIDE 0.9 % IV SOLN
2.5600 mg/m2 | Freq: Once | INTRAVENOUS | Status: AC
  Administered 2023-02-14: 4.6 mg via INTRAVENOUS
  Filled 2023-02-14: qty 9.2

## 2023-02-14 MED ORDER — HEPARIN SOD (PORK) LOCK FLUSH 100 UNIT/ML IV SOLN
INTRAVENOUS | Status: AC
  Administered 2023-02-14: 500 [IU]
  Filled 2023-02-14: qty 5

## 2023-02-14 MED ORDER — SODIUM CHLORIDE 0.9 % IV SOLN
10.0000 mg | Freq: Once | INTRAVENOUS | Status: AC
  Administered 2023-02-14: 10 mg via INTRAVENOUS
  Filled 2023-02-14: qty 10

## 2023-02-14 MED ORDER — PALONOSETRON HCL INJECTION 0.25 MG/5ML
0.2500 mg | Freq: Once | INTRAVENOUS | Status: AC
  Administered 2023-02-14: 0.25 mg via INTRAVENOUS
  Filled 2023-02-14: qty 5

## 2023-02-14 NOTE — Progress Notes (Signed)
Hematology/Oncology Consult note [facility]  Telephone:([phone] Fax:([phone]  Patient Care Team: Leonel Ramsay, MD as PCP - General (Infectious Diseases) Telford Nab, RN as Oncology Nurse Navigator Sindy Guadeloupe, MD as Consulting Physician (Hematology and Oncology)   Name of the patient: Doris Naseem  [id number]  1951-10-15   Date of visit: 02/14/23  Diagnosis- extensive stage small cell lung cancer with liver lymph node and brain metastases    Chief complaint/ Reason for visit-discuss CT scan results and further management and on treatment assessment prior to cycle 9 of lurbinectedin  Heme/Onc history: Patient is a 72 year old female with a remote history of smoking in her teenage years and exposure to passive smoking.  She has been having ongoing midsternal pain which has been growing since the last 6 to 7 months.  She had been to urgent care as well in the past.  She finally had a CT chest with contrast done in October 2021 which showed a large mass in the left side of the mediastinum measuring 6.7 x 6.2 x 6.1 cm causing severe compression of the left main pulmonary artery and around the aortic arch in the left subclavian artery.  Enlarged thyroid gland.  Left upper lobe nodule 1 x 0.7 cm.  She then underwent bronchoscopy with Dr.Aleskerov left upper lobe biopsy was nondiagnostic.  However station 4, 7 and 10 L lymph nodes were consistent with metastatic small cell carcinoma.   PET CT scan showed enlarged level 3 cervical lymph node 2.2 cm that was hypermetabolic with an SUV of 9.7.  Large central 7.3 cm AP window mass.  5.7 cm left liver mass.  MRI brain with and without contrast showed 2 small foci of enhancement in the right cerebellar hemisphere and right temporal lobe without mass-effect or edema as well concerning for brain metastases   Patient had 2 cycles of carbo etoposide chemotherapy along with Tecentriq and subsequent scan showed  no significant improvement in the primary lung mass or liver mass.  She received radiation treatment to her primary lung mass and also seen by [facility] for second opinion.  Her pathology was reviewed at [facility] and reported as possible neuroendocrine neoplasm But stated that small cell carcinoma cannot be excluded but not definitely diagnostic for it.  However [facility] pathology feels that this is consistent with small cell lung cancer.  She has completed 4 cycles of carbo etoposide Tecentriq chemotherapy and is currently on maintenance Tecentriq.  Repeat liver biopsy was also performed and was consistent with small cell lung cancer   Disease progression after 23 cycles of maintenance Tecentriq in the left supraclavicular lymph node region.  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  Plan to switch patient to second line lurbinectedin  Interval history-patient is tolerating treatments well.  She has mild baseline fatigue but denies other complaints.  Appetite and weight have remained normal.  Denies any leg swelling.  Denies any cough or shortness of breath  ECOG PS- 1 Pain scale- 0 Opioid associated constipation- no  Review of systems- Review of Systems  Constitutional:  Positive for malaise/fatigue. Negative for chills, fever and weight loss.  HENT:  Negative for congestion, ear discharge and nosebleeds.   Eyes:  Negative for blurred vision.  Respiratory:  Negative for cough, hemoptysis, sputum production, shortness of breath and wheezing.   Cardiovascular:  Negative for chest pain, palpitations, orthopnea and claudication.  Gastrointestinal:  Negative for abdominal pain, blood in stool, constipation, diarrhea,  heartburn, melena, nausea and vomiting.  Genitourinary:  Negative for dysuria, flank pain, frequency, hematuria and urgency.  Musculoskeletal:  Negative for back pain, joint pain and myalgias.  Skin:  Negative for rash.  Neurological:  Negative for dizziness,  tingling, focal weakness, seizures, weakness and headaches.  Endo/Heme/Allergies:  Does not bruise/bleed easily.  Psychiatric/Behavioral:  Negative for depression and suicidal ideas. The patient does not have insomnia.       No Known Allergies   Past Medical History:  Diagnosis Date   Cancer ([place])    Cataract    Diabetes mellitus without complication ([place])    Hyperlipidemia    Hypertension    Hypothyroidism    doctor's keeping an eye on thyroid    Lung cancer [facility])      Past Surgical History:  Procedure Laterality Date   ABDOMINAL HYSTERECTOMY     IR CV LINE INJECTION  09/28/2021   MYRINGOTOMY WITH TUBE PLACEMENT Right 12/29/2022   Procedure: MYRINGOTOMY WITH TUBE PLACEMENT;  Surgeon: Margaretha Sheffield, MD;  Location: [place];  Service: ENT;  Laterality: Right;  Diabetic   PORTA CATH INSERTION N/A 11/30/2020   Procedure: PORTA CATH INSERTION;  Surgeon: Algernon Huxley, MD;  Location: [place] [facility];  Service: Cardiovascular;  Laterality: N/A;   VIDEO BRONCHOSCOPY WITH ENDOBRONCHIAL NAVIGATION N/A 10/21/2020   Procedure: VIDEO BRONCHOSCOPY WITH ENDOBRONCHIAL NAVIGATION;  Surgeon: Ottie Glazier, MD;  Location: [facility];  Service: Thoracic;  Laterality: N/A;   VIDEO BRONCHOSCOPY WITH ENDOBRONCHIAL ULTRASOUND N/A 10/21/2020   Procedure: VIDEO BRONCHOSCOPY WITH ENDOBRONCHIAL ULTRASOUND;  Surgeon: Ottie Glazier, MD;  Location: [facility];  Service: Thoracic;  Laterality: N/A;    Social History   Socioeconomic History   Marital status: Single    Spouse name: Not on file   Number of children: Not on file   Years of education: Not on file   Highest education level: Not on file  Occupational History   Not on file  Tobacco Use   Smoking status: Never   Smokeless tobacco: Never  Vaping Use   Vaping Use: Never used  Substance and Sexual Activity   Alcohol use: No   Drug use: No   Sexual activity: Not Currently  Other Topics Concern   Not on file  Social History  Narrative   Not on file   Social Determinants of Health   Financial Resource Strain: Not on file  Food Insecurity: Food Insecurity Present (07/08/2021)   Hunger Vital Sign    Worried About Running Out of Food in the Last Year: Sometimes true    Ran Out of Food in the Last Year: Sometimes true  Transportation Needs: Unmet Transportation Needs (02/14/2023)   PRAPARE - Hydrologist (Medical): Yes    Lack of Transportation (Non-Medical): Yes  Physical Activity: Not on file  Stress: Not on file  Social Connections: Not on file  Intimate Partner Violence: Not on file    Family History  Problem Relation Age of Onset   Cancer Mother        breast   Hypertension Mother    Breast cancer Mother 69   Heart disease Father    Hyperlipidemia Brother    Heart disease Brother      Current Outpatient Medications:    dexamethasone (DECADRON) 4 MG tablet, Take 1 tablet (4 mg total) by mouth 2 (two) times daily with a meal., Disp: 60 tablet, Rfl: 1   ELIQUIS 5 MG TABS tablet, TAKE  1 TABLET(5 MG) BY MOUTH TWICE DAILY, Disp: 60 tablet, Rfl: 1   fluticasone (FLONASE) 50 MCG/ACT nasal spray, Place 2 sprays into both nostrils daily., Disp: 16 g, Rfl: 5   furosemide (LASIX) 20 MG tablet, Take 1 tablet (20 mg total) by mouth daily., Disp: 60 tablet, Rfl: 0   lidocaine-prilocaine (EMLA) cream, Apply 1 Application topically as needed. Apply small amount to port site at least 1 hour prior to it being accessed, cover with plastic wrap, Disp: 30 g, Rfl: 1   losartan (COZAAR) 25 MG tablet, Take 25 mg by mouth daily., Disp: , Rfl:    metFORMIN (GLUCOPHAGE-XR) 500 MG 24 hr tablet, Take 500 mg by mouth daily with breakfast., Disp: , Rfl:    potassium chloride SA (KLOR-CON M) 20 MEQ tablet, Take 2 tablets (40 mEq total) by mouth daily., Disp: 7 tablet, Rfl: 0   ciprofloxacin-dexamethasone (CIPRODEX) OTIC suspension, Place 3 drops into both ears 3 (three) times daily for 5 days. (DOS  12/29/22) (Patient not taking: Reported on 02/14/2023), Disp: 7.5 mL, Rfl: 0   lansoprazole (PREVACID) 30 MG capsule, Take 1 capsule (30 mg total) by mouth daily. (Patient not taking: Reported on 02/14/2023), Disp: 30 capsule, Rfl: 1 No current facility-administered medications for this visit.  Facility-Administered Medications Ordered in Other Visits:    sodium chloride flush (NS) 0.9 % injection 10 mL, 10 mL, Intravenous, PRN, Sindy Guadeloupe, MD, 10 mL at 12/15/20 0850  Physical exam:  Vitals:   02/14/23 0835  BP: (!) 156/86  Pulse: 60  Temp: 98.4 F (36.9 C)  TempSrc: Tympanic  Weight: 163 lb 8 oz (74.2 kg)  Height: 5' 5.5" (1.664 m)   Physical Exam Cardiovascular:     Rate and Rhythm: Normal rate and regular rhythm.     Heart sounds: Normal heart sounds.  Pulmonary:     Effort: Pulmonary effort is normal.     Breath sounds: Normal breath sounds.  Abdominal:     General: Bowel sounds are normal.     Palpations: Abdomen is soft.  Skin:    General: Skin is warm and dry.  Neurological:     Mental Status: She is alert and oriented to person, place, and time.         Latest Ref Rng & Units 01/24/2023    9:42 AM  CMP  Glucose 70 - 99 mg/dL 92   BUN 8 - 23 mg/dL 16   Creatinine 0.44 - 1.00 mg/dL 0.66   Sodium 135 - 145 mmol/L 139   Potassium 3.5 - 5.1 mmol/L 3.7   Chloride 98 - 111 mmol/L 110   CO2 22 - 32 mmol/L 23   Calcium 8.9 - 10.3 mg/dL 9.3   Total Protein 6.5 - 8.1 g/dL 6.9   Total Bilirubin 0.3 - 1.2 mg/dL 0.4   Alkaline Phos 38 - 126 U/L 82   AST 15 - 41 U/L 14   ALT 0 - 44 U/L 9       Latest Ref Rng & Units 02/14/2023    8:19 AM  CBC  WBC 4.0 - 10.5 K/uL 2.3   Hemoglobin 12.0 - 15.0 g/dL 10.1   Hematocrit 36.0 - 46.0 % 31.4   Platelets 150 - 400 K/uL 289     No images are attached to the encounter.  MR Brain W Wo Contrast  Result Date: 02/09/2023 CLINICAL DATA:  Metastatic small cell lung carcinoma. EXAM: MRI HEAD WITHOUT AND WITH CONTRAST TECHNIQUE:  Multiplanar, multiecho pulse  sequences of the brain and surrounding structures were obtained without and with intravenous contrast. CONTRAST:  73mL GADAVIST GADOBUTROL 1 MMOL/ML IV SOLN COMPARISON:  07/11/2022 FINDINGS: Brain: No acute infarct, mass effect or extra-axial collection. There are multiple foci of chronic microhemorrhage in unchanged distribution within both cerebral hemispheres and the right cerebellum. Progression of white matter changes that may be a sequela radiation treatment. There are multiple punctate contrast enhancing lesions: 1. Paramedian right parietal lobe, decreased in size, image 106 2. Posterior left parietal lobe, unchanged, image 90 3. Anterior inferior left frontal lobe, unchanged, image 73 4. Lateral right temporal lobe, unchanged, image 62 5. Right cerebellum, unchanged, image 49 There are no new lesions. Previously seen anterior right parietal lobe lesion (series 19, image 113 on 07/11/2022 study) is no longer visible. Other nonenhancing treated lesions are unchanged. Vascular: Normal flow voids. Skull and upper cervical spine: Normal marrow signal. Sinuses/Orbits: Bilateral mastoid effusions. Paranasal sinuses are clear. Bilateral ocular lens replacements. Other: None IMPRESSION: 1. Decreased size of multiple punctate metastases.  No new lesions. 2. Progression of white matter changes that may be a sequela of radiation treatment. Electronically Signed   By: Ulyses Jarred M.D.   On: 02/09/2023 02:53   CT CHEST ABDOMEN PELVIS W CONTRAST  Result Date: 02/08/2023 CLINICAL DATA:  Small-cell lung cancer. Restaging. * Tracking Code: BO * EXAM: CT CHEST, ABDOMEN, AND PELVIS WITH CONTRAST TECHNIQUE: Multidetector CT imaging of the chest, abdomen and pelvis was performed following the standard protocol during bolus administration of intravenous contrast. RADIATION DOSE REDUCTION: This exam was performed according to the departmental dose-optimization program which includes automated  exposure control, adjustment of the mA and/or kV according to patient size and/or use of iterative reconstruction technique. CONTRAST:  61mL OMNIPAQUE IOHEXOL 300 MG/ML  SOLN COMPARISON:  10/18/2022 FINDINGS: CT CHEST FINDINGS Cardiovascular: The heart size is normal. No substantial pericardial effusion. Right Port-A-Cath tip is positioned in the distal SVC. Mediastinum/Nodes: 14 mm short axis left supraclavicular node seen on the previous study is 12 mm short axis today on image 5/series 2. Similar appearance 3.7 cm right thyroid nodule. This has been evaluated on previous imaging. (ref: J Am Coll Radiol. 2015 Feb;12(2): 143-50).Confluent soft tissue attenuation in the AP window is stable. No mediastinal lymphadenopathy. There is no hilar lymphadenopathy. The esophagus has normal imaging features. There is no axillary lymphadenopathy. Lungs/Pleura: 4 mm anterior right upper lobe subpleural nodule is 3 mm previously. 4 mm right lower lobe perifissural nodule on 68/3 was 4 mm previously. Similar appearance of post radiation fibrosis in the suprahilar and parahilar left upper lung. 3 mm left lower lobe subpleural nodule measured previously is 5 mm today on image 67/3. Patchy ground-glass opacity in the dependent lungs may be related to atelectasis. No pleural effusion. Musculoskeletal: No worrisome lytic or sclerotic osseous abnormality. CT ABDOMEN PELVIS FINDINGS Hepatobiliary: 2.4 x 2.5 cm ill-defined low-density subcapsular lesion along the gallbladder fossa was 3.2 x 2.4 cm previously. No new liver lesion on today's study. There is no evidence for gallstones, gallbladder wall thickening, or pericholecystic fluid. No intrahepatic or extrahepatic biliary dilation. Pancreas: No focal mass lesion. No dilatation of the main duct. No intraparenchymal cyst. No peripancreatic edema. Spleen: No splenomegaly. No focal mass lesion. Adrenals/Urinary Tract: No adrenal nodule or mass. Kidneys unremarkable. No evidence for  hydroureter. The urinary bladder appears normal for the degree of distention. Stomach/Bowel: Stomach is unremarkable. No gastric wall thickening. No evidence of outlet obstruction. Duodenum is normally positioned as is  the ligament of Treitz. No small bowel wall thickening. No small bowel dilatation. The terminal ileum is normal. The appendix is normal. No gross colonic mass. No colonic wall thickening. Vascular/Lymphatic: There is mild atherosclerotic calcification of the abdominal aorta without aneurysm. There is no gastrohepatic or hepatoduodenal ligament lymphadenopathy. No retroperitoneal or mesenteric lymphadenopathy. No pelvic sidewall lymphadenopathy. Prominent lymph nodes in the groin regions are likely reactive. Reproductive: Hysterectomy.  There is no adnexal mass. Other: No worrisome lytic or sclerotic osseous abnormality. Musculoskeletal: IMPRESSION: 1. Stable exam. No new or progressive findings in the chest, abdomen, or pelvis. 2. Stable appearance of post radiation fibrosis in the suprahilar and parahilar left upper lung. 3. Slight interval decrease in size of the index left supraclavicular lymph node. 4. Stable appearance of ill-defined low-density subcapsular lesion along the gallbladder fossa previously described as treated lesion. 5. Tiny bilateral pulmonary nodules are stable to minimally increased in size in the interval. Likely benign, continued attention on follow-up recommended. 6. Prominent lymph nodes in the groin regions are likely reactive. Attention on follow-up recommended. 7.  Aortic Atherosclerosis (ICD10-I70.0). Electronically Signed   By: Misty Stanley M.D.   On: 02/08/2023 10:33     Assessment and plan- Patient is a 72 y.o. female with extensive stage small cell lung cancer here for on treatment assessment prior to cycle 9 of lurbinectedin and discuss CT scan results and further management  I have reviewed MRI brain images independently and discussed findings with the patient  which overall shows decrease in the size of brain metastases and no new lesions.  I have also reviewed CT chest abdomen pelvis images independently and discussed findings with the patient which overall shows stable size of left upper lobe lung mass as well as solitary liver met.  She was noted to have mildly progressive disease a few months ago involving the left supraclavicular lymph node which is also decreased in size.  No evidence of new or progressive disease at this time.  My plan is to continue lurbinectedin until progression or toxicity.  White cell count is 2.3 today with an ANC of 1.2.  She will proceed with lurbinectedin today and will receive Udenyca on day 3 of pump disconnect.  I am decreasing the dose of lurbinectedin to 2.6 mg/m from 3.2 mg/m given her neutropenia.    Visit Diagnosis 1. Small cell carcinoma of lung metastatic to liver   2. Encounter for antineoplastic chemotherapy   3. Goals of care, counseling/discussion   4. Chemotherapy induced neutropenia      Dr. Randa Evens, MD, MPH [facility] at [facility] [id number] 02/14/2023 8:57 AM

## 2023-02-14 NOTE — Patient Instructions (Signed)
[facility]  Discharge Instructions: Thank you for choosing [facility] to provide your oncology and hematology care.  If you have a lab appointment with the [facility], please go directly to the [facility] and check in at the registration area.  Wear comfortable clothing and clothing appropriate for easy access to any Portacath or PICC line.   We strive to give you quality time with your provider. You may need to reschedule your appointment if you arrive late (15 or more minutes).  Arriving late affects you and other patients whose appointments are after yours.  Also, if you miss three or more appointments without notifying the office, you may be dismissed from the clinic at the provider's discretion.      For prescription refill requests, have your pharmacy contact our office and allow 72 hours for refills to be completed.    Today you received the following chemotherapy and/or immunotherapy agents lurbinectedin   To help prevent nausea and vomiting after your treatment, we encourage you to take your nausea medication as directed.  BELOW ARE SYMPTOMS THAT SHOULD BE REPORTED IMMEDIATELY: *FEVER GREATER THAN 100.4 F (38 C) OR HIGHER *CHILLS OR SWEATING *NAUSEA AND VOMITING THAT IS NOT CONTROLLED WITH YOUR NAUSEA MEDICATION *UNUSUAL SHORTNESS OF BREATH *UNUSUAL BRUISING OR BLEEDING *URINARY PROBLEMS (pain or burning when urinating, or frequent urination) *BOWEL PROBLEMS (unusual diarrhea, constipation, pain near the anus) TENDERNESS IN MOUTH AND THROAT WITH OR WITHOUT PRESENCE OF ULCERS (sore throat, sores in mouth, or a toothache) UNUSUAL RASH, SWELLING OR PAIN  UNUSUAL VAGINAL DISCHARGE OR ITCHING   Items with * indicate a potential emergency and should be followed up as soon as possible or go to the Emergency Department if any problems should occur.  Please show the CHEMOTHERAPY ALERT CARD or IMMUNOTHERAPY ALERT CARD at check-in to  the Emergency Department and triage nurse.  Should you have questions after your visit or need to cancel or reschedule your appointment, please contact [place]  [phone] and follow the prompts.  Office hours are 8:00 a.m. to 4:30 p.m. Monday - Friday. Please note that voicemails left after 4:00 p.m. may not be returned until the following business day.  We are closed weekends and major holidays. You have access to a nurse at all times for urgent questions. Please call the main number to the clinic [phone] and follow the prompts.  For any non-urgent questions, you may also contact your provider using MyChart. We now offer e-Visits for anyone 23 and older to request care online for non-urgent symptoms. For details visit [email or web address].   Also download the MyChart app! Go to the app store, search "MyChart", open the app, select [place], and log in with your MyChart username and password.

## 2023-02-14 NOTE — Addendum Note (Signed)
Addended by: Randa Evens C on: 02/14/2023 09:05 AM   Modules accepted: Orders

## 2023-02-16 ENCOUNTER — Inpatient Hospital Stay: Payer: Medicare HMO

## 2023-02-16 DIAGNOSIS — C787 Secondary malignant neoplasm of liver and intrahepatic bile duct: Secondary | ICD-10-CM | POA: Diagnosis not present

## 2023-02-16 DIAGNOSIS — D708 Other neutropenia: Secondary | ICD-10-CM | POA: Diagnosis not present

## 2023-02-16 DIAGNOSIS — Z5111 Encounter for antineoplastic chemotherapy: Secondary | ICD-10-CM | POA: Diagnosis not present

## 2023-02-16 DIAGNOSIS — E041 Nontoxic single thyroid nodule: Secondary | ICD-10-CM | POA: Diagnosis not present

## 2023-02-16 DIAGNOSIS — T451X5A Adverse effect of antineoplastic and immunosuppressive drugs, initial encounter: Secondary | ICD-10-CM | POA: Diagnosis not present

## 2023-02-16 DIAGNOSIS — C770 Secondary and unspecified malignant neoplasm of lymph nodes of head, face and neck: Secondary | ICD-10-CM | POA: Diagnosis not present

## 2023-02-16 DIAGNOSIS — C3412 Malignant neoplasm of upper lobe, left bronchus or lung: Secondary | ICD-10-CM | POA: Diagnosis not present

## 2023-02-16 DIAGNOSIS — E119 Type 2 diabetes mellitus without complications: Secondary | ICD-10-CM | POA: Diagnosis not present

## 2023-02-16 DIAGNOSIS — C7931 Secondary malignant neoplasm of brain: Secondary | ICD-10-CM | POA: Diagnosis not present

## 2023-02-16 MED ORDER — PEGFILGRASTIM-CBQV 6 MG/0.6ML ~~LOC~~ SOSY
6.0000 mg | PREFILLED_SYRINGE | Freq: Once | SUBCUTANEOUS | Status: AC
  Administered 2023-02-16: 6 mg via SUBCUTANEOUS
  Filled 2023-02-16: qty 0.6

## 2023-02-28 ENCOUNTER — Encounter: Payer: Self-pay | Admitting: Oncology

## 2023-03-06 MED FILL — Dexamethasone Sodium Phosphate Inj 100 MG/10ML: INTRAMUSCULAR | Qty: 1 | Status: AC

## 2023-03-07 ENCOUNTER — Inpatient Hospital Stay: Payer: Medicare HMO

## 2023-03-07 ENCOUNTER — Inpatient Hospital Stay (HOSPITAL_BASED_OUTPATIENT_CLINIC_OR_DEPARTMENT_OTHER): Payer: Medicare HMO | Admitting: Oncology

## 2023-03-07 ENCOUNTER — Encounter: Payer: Self-pay | Admitting: Oncology

## 2023-03-07 VITALS — BP 136/78 | HR 61 | Temp 96.1°F | Resp 18 | Ht 66.0 in | Wt 163.7 lb

## 2023-03-07 DIAGNOSIS — T451X5A Adverse effect of antineoplastic and immunosuppressive drugs, initial encounter: Secondary | ICD-10-CM | POA: Diagnosis not present

## 2023-03-07 DIAGNOSIS — E041 Nontoxic single thyroid nodule: Secondary | ICD-10-CM | POA: Diagnosis not present

## 2023-03-07 DIAGNOSIS — Z5111 Encounter for antineoplastic chemotherapy: Secondary | ICD-10-CM

## 2023-03-07 DIAGNOSIS — C3490 Malignant neoplasm of unspecified part of unspecified bronchus or lung: Secondary | ICD-10-CM

## 2023-03-07 DIAGNOSIS — Z7901 Long term (current) use of anticoagulants: Secondary | ICD-10-CM

## 2023-03-07 DIAGNOSIS — C3412 Malignant neoplasm of upper lobe, left bronchus or lung: Secondary | ICD-10-CM | POA: Diagnosis not present

## 2023-03-07 DIAGNOSIS — C7931 Secondary malignant neoplasm of brain: Secondary | ICD-10-CM | POA: Diagnosis not present

## 2023-03-07 DIAGNOSIS — D708 Other neutropenia: Secondary | ICD-10-CM | POA: Diagnosis not present

## 2023-03-07 DIAGNOSIS — E119 Type 2 diabetes mellitus without complications: Secondary | ICD-10-CM | POA: Diagnosis not present

## 2023-03-07 DIAGNOSIS — C787 Secondary malignant neoplasm of liver and intrahepatic bile duct: Secondary | ICD-10-CM | POA: Diagnosis not present

## 2023-03-07 DIAGNOSIS — C770 Secondary and unspecified malignant neoplasm of lymph nodes of head, face and neck: Secondary | ICD-10-CM | POA: Diagnosis not present

## 2023-03-07 LAB — CBC WITH DIFFERENTIAL/PLATELET
Abs Immature Granulocytes: 0.01 10*3/uL (ref 0.00–0.07)
Basophils Absolute: 0 10*3/uL (ref 0.0–0.1)
Basophils Relative: 1 %
Eosinophils Absolute: 0.1 10*3/uL (ref 0.0–0.5)
Eosinophils Relative: 1 %
HCT: 34.2 % — ABNORMAL LOW (ref 36.0–46.0)
Hemoglobin: 11.1 g/dL — ABNORMAL LOW (ref 12.0–15.0)
Immature Granulocytes: 0 %
Lymphocytes Relative: 20 %
Lymphs Abs: 0.7 10*3/uL (ref 0.7–4.0)
MCH: 30.7 pg (ref 26.0–34.0)
MCHC: 32.5 g/dL (ref 30.0–36.0)
MCV: 94.7 fL (ref 80.0–100.0)
Monocytes Absolute: 0.6 10*3/uL (ref 0.1–1.0)
Monocytes Relative: 17 %
Neutro Abs: 2.2 10*3/uL (ref 1.7–7.7)
Neutrophils Relative %: 61 %
Platelets: 413 10*3/uL — ABNORMAL HIGH (ref 150–400)
RBC: 3.61 MIL/uL — ABNORMAL LOW (ref 3.87–5.11)
RDW: 15.6 % — ABNORMAL HIGH (ref 11.5–15.5)
WBC: 3.6 10*3/uL — ABNORMAL LOW (ref 4.0–10.5)
nRBC: 0 % (ref 0.0–0.2)

## 2023-03-07 LAB — COMPREHENSIVE METABOLIC PANEL
ALT: 8 U/L (ref 0–44)
AST: 14 U/L — ABNORMAL LOW (ref 15–41)
Albumin: 3.9 g/dL (ref 3.5–5.0)
Alkaline Phosphatase: 85 U/L (ref 38–126)
Anion gap: 7 (ref 5–15)
BUN: 14 mg/dL (ref 8–23)
CO2: 24 mmol/L (ref 22–32)
Calcium: 9.2 mg/dL (ref 8.9–10.3)
Chloride: 107 mmol/L (ref 98–111)
Creatinine, Ser: 0.48 mg/dL (ref 0.44–1.00)
GFR, Estimated: >60 mL/min (ref >60)
Glucose, Bld: 108 mg/dL — ABNORMAL HIGH (ref 70–99)
Potassium: 4.1 mmol/L (ref 3.5–5.1)
Sodium: 138 mmol/L (ref 135–145)
Total Bilirubin: 0.4 mg/dL (ref 0.3–1.2)
Total Protein: 6.9 g/dL (ref 6.5–8.1)

## 2023-03-07 MED ORDER — SODIUM CHLORIDE 0.9 % IV SOLN
2.5600 mg/m2 | Freq: Once | INTRAVENOUS | Status: AC
  Administered 2023-03-07: 4.6 mg via INTRAVENOUS
  Filled 2023-03-07: qty 9.2

## 2023-03-07 MED ORDER — HEPARIN SOD (PORK) LOCK FLUSH 100 UNIT/ML IV SOLN
500.0000 [IU] | Freq: Once | INTRAVENOUS | Status: DC | PRN
  Filled 2023-03-07: qty 5

## 2023-03-07 MED ORDER — SODIUM CHLORIDE 0.9 % IV SOLN
Freq: Once | INTRAVENOUS | Status: AC
  Filled 2023-03-07: qty 250

## 2023-03-07 MED ORDER — SODIUM CHLORIDE 0.9% FLUSH
10.0000 mL | INTRAVENOUS | Status: AC | PRN
  Administered 2023-03-07: 10 mL via INTRAVENOUS
  Filled 2023-03-07: qty 10

## 2023-03-07 MED ORDER — PALONOSETRON HCL INJECTION 0.25 MG/5ML
0.2500 mg | Freq: Once | INTRAVENOUS | Status: AC
  Administered 2023-03-07: 0.25 mg via INTRAVENOUS
  Filled 2023-03-07: qty 5

## 2023-03-07 MED ORDER — SODIUM CHLORIDE 0.9 % IV SOLN
10.0000 mg | Freq: Once | INTRAVENOUS | Status: AC
  Administered 2023-03-07: 10 mg via INTRAVENOUS
  Filled 2023-03-07: qty 10

## 2023-03-07 MED ORDER — HEPARIN SOD (PORK) LOCK FLUSH 100 UNIT/ML IV SOLN
500.0000 [IU] | Freq: Once | INTRAVENOUS | Status: AC
  Administered 2023-03-07: 500 [IU] via INTRAVENOUS
  Filled 2023-03-07: qty 5

## 2023-03-07 NOTE — Progress Notes (Signed)
No concerns today 

## 2023-03-07 NOTE — Progress Notes (Signed)
Hematology/Oncology Consult note [facility]  Telephone:([phone] Fax:([phone]) [phone]  Patient Care Team: Mick Sell, MD as PCP - General (Infectious Diseases) Glory Buff, RN as Oncology Nurse Navigator Creig Hines, MD as Consulting Physician (Hematology and Oncology)   Name of the patient: Doris Johnson  [id number]  05/07/1951   Date of visit: 03/07/23  Diagnosis- extensive stage small cell lung cancer with liver lymph node and brain metastases   Chief complaint/ Reason for visit-on treatment assessment prior to cycle 10 of lurbinectedin  Heme/Onc history: Patient is a 72 year old female with a remote history of smoking in her teenage years and exposure to passive smoking.  She has been having ongoing midsternal pain which has been growing since the last 6 to 7 months.  She had been to urgent care as well in the past.  She finally had a CT chest with contrast done in October 2021 which showed a large mass in the left side of the mediastinum measuring 6.7 x 6.2 x 6.1 cm causing severe compression of the left main pulmonary artery and around the aortic arch in the left subclavian artery.  Enlarged thyroid gland.  Left upper lobe nodule 1 x 0.7 cm.  She then underwent bronchoscopy with Dr.Aleskerov left upper lobe biopsy was nondiagnostic.  However station 4, 7 and 10 L lymph nodes were consistent with metastatic small cell carcinoma.   PET CT scan showed enlarged level 3 cervical lymph node 2.2 cm that was hypermetabolic with an SUV of 9.7.  Large central 7.3 cm AP window mass.  5.7 cm left liver mass.  MRI brain with and without contrast showed 2 small foci of enhancement in the right cerebellar hemisphere and right temporal lobe without mass-effect or edema as well concerning for brain metastases   Patient had 2 cycles of carbo etoposide chemotherapy along with Tecentriq and subsequent scan showed no significant improvement in the primary lung  mass or liver mass.  She received radiation treatment to her primary lung mass and also seen by [facility] for second opinion.  Her pathology was reviewed at [facility] and reported as possible neuroendocrine neoplasm But stated that small cell carcinoma cannot be excluded but not definitely diagnostic for it.  However [facility] pathology feels that this is consistent with small cell lung cancer.  She has completed 4 cycles of carbo etoposide Tecentriq chemotherapy and is currently on maintenance Tecentriq.  Repeat liver biopsy was also performed and was consistent with small cell lung cancer   Disease progression after 23 cycles of maintenance Tecentriq in the left supraclavicular lymph node region.  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  Plan to switch patient to second line lurbinectedin  Interval history-tolerating treatments well without any significant side effects.  Appetite and weight have remained stable.  Denies any nausea or vomiting.  Denies any recent infections  ECOG PS- 1 Pain scale- 0   Review of systems- Review of Systems  Constitutional:  Negative for chills, fever, malaise/fatigue and weight loss.  HENT:  Negative for congestion, ear discharge and nosebleeds.   Eyes:  Negative for blurred vision.  Respiratory:  Negative for cough, hemoptysis, sputum production, shortness of breath and wheezing.   Cardiovascular:  Negative for chest pain, palpitations, orthopnea and claudication.  Gastrointestinal:  Negative for abdominal pain, blood in stool, constipation, diarrhea, heartburn, melena, nausea and vomiting.  Genitourinary:  Negative for dysuria, flank pain, frequency, hematuria and urgency.  Musculoskeletal:  Negative for back  pain, joint pain and myalgias.  Skin:  Negative for rash.  Neurological:  Negative for dizziness, tingling, focal weakness, seizures, weakness and headaches.  Endo/Heme/Allergies:  Does not bruise/bleed easily.   Psychiatric/Behavioral:  Negative for depression and suicidal ideas. The patient does not have insomnia.       No Known Allergies   Past Medical History:  Diagnosis Date   Cancer    Cataract    Diabetes mellitus without complication    Hyperlipidemia    Hypertension    Hypothyroidism    doctor's keeping an eye on thyroid    Lung cancer      Past Surgical History:  Procedure Laterality Date   ABDOMINAL HYSTERECTOMY     IR CV LINE INJECTION  09/28/2021   MYRINGOTOMY WITH TUBE PLACEMENT Right 12/29/2022   Procedure: MYRINGOTOMY WITH TUBE PLACEMENT;  Surgeon: Vernie Murders, MD;  Location: [facility];  Service: ENT;  Laterality: Right;  Diabetic   PORTA CATH INSERTION N/A 11/30/2020   Procedure: PORTA CATH INSERTION;  Surgeon: Annice Needy, MD;  Location: [facility] INVASIVE CV LAB;  Service: Cardiovascular;  Laterality: N/A;   VIDEO BRONCHOSCOPY WITH ENDOBRONCHIAL NAVIGATION N/A 10/21/2020   Procedure: VIDEO BRONCHOSCOPY WITH ENDOBRONCHIAL NAVIGATION;  Surgeon: Vida Rigger, MD;  Location: [facility] ORS;  Service: Thoracic;  Laterality: N/A;   VIDEO BRONCHOSCOPY WITH ENDOBRONCHIAL ULTRASOUND N/A 10/21/2020   Procedure: VIDEO BRONCHOSCOPY WITH ENDOBRONCHIAL ULTRASOUND;  Surgeon: Vida Rigger, MD;  Location: [facility] ORS;  Service: Thoracic;  Laterality: N/A;    Social History   Socioeconomic History   Marital status: Single    Spouse name: Not on file   Number of children: Not on file   Years of education: Not on file   Highest education level: Not on file  Occupational History   Not on file  Tobacco Use   Smoking status: Never   Smokeless tobacco: Never  Vaping Use   Vaping Use: Never used  Substance and Sexual Activity   Alcohol use: No   Drug use: No   Sexual activity: Not Currently  Other Topics Concern   Not on file  Social History Narrative   Not on file   Social Determinants of Health   Financial Resource Strain: Not on file  Food Insecurity: Food  Insecurity Present (07/08/2021)   Hunger Vital Sign    Worried About Running Out of Food in the Last Year: Sometimes true    Ran Out of Food in the Last Year: Sometimes true  Transportation Needs: Unmet Transportation Needs (03/07/2023)   PRAPARE - Administrator, Civil Service (Medical): Yes    Lack of Transportation (Non-Medical): Yes  Physical Activity: Not on file  Stress: Not on file  Social Connections: Not on file  Intimate Partner Violence: Not on file    Family History  Problem Relation Age of Onset   Cancer Mother        breast   Hypertension Mother    Breast cancer Mother 16   Heart disease Father    Hyperlipidemia Brother    Heart disease Brother      Current Outpatient Medications:    dexamethasone (DECADRON) 4 MG tablet, Take 1 tablet (4 mg total) by mouth 2 (two) times daily with a meal., Disp: 60 tablet, Rfl: 1   ELIQUIS 5 MG TABS tablet, TAKE 1 TABLET(5 MG) BY MOUTH TWICE DAILY, Disp: 60 tablet, Rfl: 1   fluticasone (FLONASE) 50 MCG/ACT nasal spray, Place 2 sprays into both nostrils  daily., Disp: 16 g, Rfl: 5   furosemide (LASIX) 20 MG tablet, Take 1 tablet (20 mg total) by mouth daily., Disp: 60 tablet, Rfl: 0   lidocaine-prilocaine (EMLA) cream, Apply 1 Application topically as needed. Apply small amount to port site at least 1 hour prior to it being accessed, cover with plastic wrap, Disp: 30 g, Rfl: 1   losartan (COZAAR) 25 MG tablet, Take 1 tablet by mouth daily., Disp: , Rfl:    metFORMIN (GLUCOPHAGE-XR) 500 MG 24 hr tablet, Take by mouth., Disp: , Rfl:    potassium chloride SA (KLOR-CON M) 20 MEQ tablet, Take 1 tablet by mouth daily., Disp: , Rfl:  No current facility-administered medications for this visit.  Facility-Administered Medications Ordered in Other Visits:    heparin lock flush 100 unit/mL, 500 Units, Intravenous, Once, Creig Hines, MD   sodium chloride flush (NS) 0.9 % injection 10 mL, 10 mL, Intravenous, PRN, Creig Hines, MD,  10 mL at 12/15/20 0850   sodium chloride flush (NS) 0.9 % injection 10 mL, 10 mL, Intravenous, PRN, Creig Hines, MD, 10 mL at 03/07/23 0923  Physical exam:  Vitals:   03/07/23 0930  BP: 136/78  Pulse: 61  Resp: 18  Temp: (!) 96.1 F (35.6 C)  TempSrc: Tympanic  SpO2: 100%  Weight: 163 lb 11.2 oz (74.3 kg)  Height:  (1.676 m)   Physical Exam Cardiovascular:     Rate and Rhythm: Normal rate and regular rhythm.     Heart sounds: Normal heart sounds.  Pulmonary:     Effort: Pulmonary effort is normal.     Breath sounds: Normal breath sounds.  Abdominal:     General: Bowel sounds are normal.     Palpations: Abdomen is soft.  Skin:    General: Skin is warm and dry.  Neurological:     Mental Status: She is alert and oriented to person, place, and time.         Latest Ref Rng & Units 03/07/2023    9:15 AM  CMP  Glucose 70 - 99 mg/dL 696   BUN 8 - 23 mg/dL PENDING   Creatinine 2.95 - 1.00 mg/dL PENDING   Sodium 284 - 145 mmol/L 138   Potassium 3.5 - 5.1 mmol/L 4.1   Chloride 98 - 111 mmol/L 107   CO2 22 - 32 mmol/L 24   Calcium 8.9 - 10.3 mg/dL 9.2   Total Protein 6.5 - 8.1 g/dL PENDING   Total Bilirubin 0.3 - 1.2 mg/dL PENDING   Alkaline Phos 38 - 126 U/L PENDING   AST 15 - 41 U/L PENDING   ALT 0 - 44 U/L PENDING       Latest Ref Rng & Units 03/07/2023    9:15 AM  CBC  WBC 4.0 - 10.5 K/uL 3.6   Hemoglobin 12.0 - 15.0 g/dL 13.2   Hematocrit 44.0 - 46.0 % 34.2   Platelets 150 - 400 K/uL 413     No images are attached to the encounter.  MR Brain W Wo Contrast  Result Date: 02/09/2023 CLINICAL DATA:  Metastatic small cell lung carcinoma. EXAM: MRI HEAD WITHOUT AND WITH CONTRAST TECHNIQUE: Multiplanar, multiecho pulse sequences of the brain and surrounding structures were obtained without and with intravenous contrast. CONTRAST:  7mL GADAVIST GADOBUTROL 1 MMOL/ML IV SOLN COMPARISON:  07/11/2022 FINDINGS: Brain: No acute infarct, mass effect or extra-axial  collection. There are multiple foci of chronic microhemorrhage in unchanged distribution within both cerebral  hemispheres and the right cerebellum. Progression of white matter changes that may be a sequela radiation treatment. There are multiple punctate contrast enhancing lesions: 1. Paramedian right parietal lobe, decreased in size, image 106 2. Posterior left parietal lobe, unchanged, image 90 3. Anterior inferior left frontal lobe, unchanged, image 73 4. Lateral right temporal lobe, unchanged, image 62 5. Right cerebellum, unchanged, image 49 There are no new lesions. Previously seen anterior right parietal lobe lesion (series 19, image 113 on 07/11/2022 study) is no longer visible. Other nonenhancing treated lesions are unchanged. Vascular: Normal flow voids. Skull and upper cervical spine: Normal marrow signal. Sinuses/Orbits: Bilateral mastoid effusions. Paranasal sinuses are clear. Bilateral ocular lens replacements. Other: None IMPRESSION: 1. Decreased size of multiple punctate metastases.  No new lesions. 2. Progression of white matter changes that may be a sequela of radiation treatment. Electronically Signed   By: Deatra Robinson M.D.   On: 02/09/2023 02:53   CT CHEST ABDOMEN PELVIS W CONTRAST  Result Date: 02/08/2023 CLINICAL DATA:  Small-cell lung cancer. Restaging. * Tracking Code: BO * EXAM: CT CHEST, ABDOMEN, AND PELVIS WITH CONTRAST TECHNIQUE: Multidetector CT imaging of the chest, abdomen and pelvis was performed following the standard protocol during bolus administration of intravenous contrast. RADIATION DOSE REDUCTION: This exam was performed according to the departmental dose-optimization program which includes automated exposure control, adjustment of the mA and/or kV according to patient size and/or use of iterative reconstruction technique. CONTRAST:  85mL OMNIPAQUE IOHEXOL 300 MG/ML  SOLN COMPARISON:  10/18/2022 FINDINGS: CT CHEST FINDINGS Cardiovascular: The heart size is normal. No  substantial pericardial effusion. Right Port-A-Cath tip is positioned in the distal SVC. Mediastinum/Nodes: 14 mm short axis left supraclavicular node seen on the previous study is 12 mm short axis today on image 5/series 2. Similar appearance 3.7 cm right thyroid nodule. This has been evaluated on previous imaging. (ref: J Am Coll Radiol. 2015 Feb;12(2): 143-50).Confluent soft tissue attenuation in the AP window is stable. No mediastinal lymphadenopathy. There is no hilar lymphadenopathy. The esophagus has normal imaging features. There is no axillary lymphadenopathy. Lungs/Pleura: 4 mm anterior right upper lobe subpleural nodule is 3 mm previously. 4 mm right lower lobe perifissural nodule on 68/3 was 4 mm previously. Similar appearance of post radiation fibrosis in the suprahilar and parahilar left upper lung. 3 mm left lower lobe subpleural nodule measured previously is 5 mm today on image 67/3. Patchy ground-glass opacity in the dependent lungs may be related to atelectasis. No pleural effusion. Musculoskeletal: No worrisome lytic or sclerotic osseous abnormality. CT ABDOMEN PELVIS FINDINGS Hepatobiliary: 2.4 x 2.5 cm ill-defined low-density subcapsular lesion along the gallbladder fossa was 3.2 x 2.4 cm previously. No new liver lesion on today's study. There is no evidence for gallstones, gallbladder wall thickening, or pericholecystic fluid. No intrahepatic or extrahepatic biliary dilation. Pancreas: No focal mass lesion. No dilatation of the main duct. No intraparenchymal cyst. No peripancreatic edema. Spleen: No splenomegaly. No focal mass lesion. Adrenals/Urinary Tract: No adrenal nodule or mass. Kidneys unremarkable. No evidence for hydroureter. The urinary bladder appears normal for the degree of distention. Stomach/Bowel: Stomach is unremarkable. No gastric wall thickening. No evidence of outlet obstruction. Duodenum is normally positioned as is the ligament of Treitz. No small bowel wall thickening. No  small bowel dilatation. The terminal ileum is normal. The appendix is normal. No gross colonic mass. No colonic wall thickening. Vascular/Lymphatic: There is mild atherosclerotic calcification of the abdominal aorta without aneurysm. There is no gastrohepatic or hepatoduodenal ligament  lymphadenopathy. No retroperitoneal or mesenteric lymphadenopathy. No pelvic sidewall lymphadenopathy. Prominent lymph nodes in the groin regions are likely reactive. Reproductive: Hysterectomy.  There is no adnexal mass. Other: No worrisome lytic or sclerotic osseous abnormality. Musculoskeletal: IMPRESSION: 1. Stable exam. No new or progressive findings in the chest, abdomen, or pelvis. 2. Stable appearance of post radiation fibrosis in the suprahilar and parahilar left upper lung. 3. Slight interval decrease in size of the index left supraclavicular lymph node. 4. Stable appearance of ill-defined low-density subcapsular lesion along the gallbladder fossa previously described as treated lesion. 5. Tiny bilateral pulmonary nodules are stable to minimally increased in size in the interval. Likely benign, continued attention on follow-up recommended. 6. Prominent lymph nodes in the groin regions are likely reactive. Attention on follow-up recommended. 7.  Aortic Atherosclerosis (ICD10-I70.0). Electronically Signed   By: Kennith Center M.D.   On: 02/08/2023 10:33     Assessment and plan- Patient is a 72 y.o. female with extensive stage small cell lung cancer here for on treatment assessment prior to cycle 10 of lurbinectedin  Counts okay to proceed with cycle 10 of lurbinectedin today.  White cell count has improved to 3.6 as compared to 2.3.  ANC remains more than 1.  She receives [place] on day 3 of each cycle.  I will see her back in 3 weeks for cycle 11.  Repeat scans will be in June 2024.  Clinically her left supraclavicular lymph node is stable to decreased in size.  History of renal vein thrombosis: Continue Eliquis.    Visit Diagnosis 1. Small cell carcinoma of lung metastatic to liver   2. Encounter for antineoplastic chemotherapy   3. Current use of long term anticoagulation      Dr. Owens Shark, MD, MPH [facility] at [facility] [phone] 03/07/2023 10:09 AM

## 2023-03-07 NOTE — Patient Instructions (Signed)
[place] [facility] [place] [facility]  Discharge Instructions: Thank you for choosing [facility] to provide your oncology and hematology care.  If you have a lab appointment with the Cancer Center, please go directly to the Cancer Center and check in at the registration area.  Wear comfortable clothing and clothing appropriate for easy access to any Portacath or PICC line.   We strive to give you quality time with your provider. You may need to reschedule your appointment if you arrive late (15 or more minutes).  Arriving late affects you and other patients whose appointments are after yours.  Also, if you miss three or more appointments without notifying the office, you may be dismissed from the clinic at the provider's discretion.      For prescription refill requests, have your pharmacy contact our office and allow 72 hours for refills to be completed.    Today you received the following chemotherapy and/or immunotherapy agents lurbinectedin   To help prevent nausea and vomiting after your treatment, we encourage you to take your nausea medication as directed.  BELOW ARE SYMPTOMS THAT SHOULD BE REPORTED IMMEDIATELY: *FEVER GREATER THAN 100.4 F (38 C) OR HIGHER *CHILLS OR SWEATING *NAUSEA AND VOMITING THAT IS NOT CONTROLLED WITH YOUR NAUSEA MEDICATION *UNUSUAL SHORTNESS OF BREATH *UNUSUAL BRUISING OR BLEEDING *URINARY PROBLEMS (pain or burning when urinating, or frequent urination) *BOWEL PROBLEMS (unusual diarrhea, constipation, pain near the anus) TENDERNESS IN MOUTH AND THROAT WITH OR WITHOUT PRESENCE OF ULCERS (sore throat, sores in mouth, or a toothache) UNUSUAL RASH, SWELLING OR PAIN  UNUSUAL VAGINAL DISCHARGE OR ITCHING   Items with * indicate a potential emergency and should be followed up as soon as possible or go to the Emergency Department if any problems should occur.  Please show the CHEMOTHERAPY ALERT CARD or IMMUNOTHERAPY ALERT CARD at check-in to  the Emergency Department and triage nurse.  Should you have questions after your visit or need to cancel or reschedule your appointment, please contact [facility] [facility] [place] [facility] [place] [facility]  [phone] and follow the prompts.  Office hours are 8:00 a.m. to 4:30 p.m. Monday - Friday. Please note that voicemails left after 4:00 p.m. may not be returned until the following business day.  We are closed weekends and major holidays. You have access to a nurse at all times for urgent questions. Please call the main number to the clinic [phone] and follow the prompts.  For any non-urgent questions, you may also contact your provider using MyChart. We now offer e-Visits for anyone 18 and older to request care online for non-urgent symptoms. For details visit [email or web address].   Also download the MyChart app! Go to the app store, search "MyChart", open the app, select [facility], and log in with your MyChart username and password.    

## 2023-03-09 ENCOUNTER — Inpatient Hospital Stay: Payer: Medicare HMO

## 2023-03-09 DIAGNOSIS — E119 Type 2 diabetes mellitus without complications: Secondary | ICD-10-CM | POA: Diagnosis not present

## 2023-03-09 DIAGNOSIS — T451X5A Adverse effect of antineoplastic and immunosuppressive drugs, initial encounter: Secondary | ICD-10-CM | POA: Diagnosis not present

## 2023-03-09 DIAGNOSIS — C7931 Secondary malignant neoplasm of brain: Secondary | ICD-10-CM | POA: Diagnosis not present

## 2023-03-09 DIAGNOSIS — C770 Secondary and unspecified malignant neoplasm of lymph nodes of head, face and neck: Secondary | ICD-10-CM | POA: Diagnosis not present

## 2023-03-09 DIAGNOSIS — E041 Nontoxic single thyroid nodule: Secondary | ICD-10-CM | POA: Diagnosis not present

## 2023-03-09 DIAGNOSIS — C3412 Malignant neoplasm of upper lobe, left bronchus or lung: Secondary | ICD-10-CM | POA: Diagnosis not present

## 2023-03-09 DIAGNOSIS — C787 Secondary malignant neoplasm of liver and intrahepatic bile duct: Secondary | ICD-10-CM

## 2023-03-09 DIAGNOSIS — D708 Other neutropenia: Secondary | ICD-10-CM | POA: Diagnosis not present

## 2023-03-09 DIAGNOSIS — Z5111 Encounter for antineoplastic chemotherapy: Secondary | ICD-10-CM | POA: Diagnosis not present

## 2023-03-09 MED ORDER — PEGFILGRASTIM-CBQV 6 MG/0.6ML ~~LOC~~ SOSY
6.0000 mg | PREFILLED_SYRINGE | Freq: Once | SUBCUTANEOUS | Status: AC
  Administered 2023-03-09: 6 mg via SUBCUTANEOUS
  Filled 2023-03-09: qty 0.6

## 2023-03-22 DIAGNOSIS — C3490 Malignant neoplasm of unspecified part of unspecified bronchus or lung: Secondary | ICD-10-CM | POA: Diagnosis not present

## 2023-03-22 DIAGNOSIS — Z0000 Encounter for general adult medical examination without abnormal findings: Secondary | ICD-10-CM | POA: Diagnosis not present

## 2023-03-22 DIAGNOSIS — I10 Essential (primary) hypertension: Secondary | ICD-10-CM | POA: Diagnosis not present

## 2023-03-22 DIAGNOSIS — E785 Hyperlipidemia, unspecified: Secondary | ICD-10-CM | POA: Diagnosis not present

## 2023-03-22 DIAGNOSIS — Z780 Asymptomatic menopausal state: Secondary | ICD-10-CM | POA: Diagnosis not present

## 2023-03-22 DIAGNOSIS — Z1331 Encounter for screening for depression: Secondary | ICD-10-CM | POA: Diagnosis not present

## 2023-03-22 DIAGNOSIS — E119 Type 2 diabetes mellitus without complications: Secondary | ICD-10-CM | POA: Diagnosis not present

## 2023-03-22 DIAGNOSIS — C7931 Secondary malignant neoplasm of brain: Secondary | ICD-10-CM | POA: Diagnosis not present

## 2023-03-22 DIAGNOSIS — C787 Secondary malignant neoplasm of liver and intrahepatic bile duct: Secondary | ICD-10-CM | POA: Diagnosis not present

## 2023-03-27 MED FILL — Dexamethasone Sodium Phosphate Inj 100 MG/10ML: INTRAMUSCULAR | Qty: 1 | Status: AC

## 2023-03-28 ENCOUNTER — Inpatient Hospital Stay: Payer: Medicare HMO | Attending: Oncology

## 2023-03-28 ENCOUNTER — Inpatient Hospital Stay (HOSPITAL_BASED_OUTPATIENT_CLINIC_OR_DEPARTMENT_OTHER): Payer: Medicare HMO | Admitting: Oncology

## 2023-03-28 ENCOUNTER — Inpatient Hospital Stay: Payer: Medicare HMO

## 2023-03-28 ENCOUNTER — Encounter: Payer: Self-pay | Admitting: Oncology

## 2023-03-28 VITALS — BP 121/78 | HR 71 | Temp 97.7°F | Resp 18 | Ht 66.0 in | Wt 162.8 lb

## 2023-03-28 DIAGNOSIS — C3412 Malignant neoplasm of upper lobe, left bronchus or lung: Secondary | ICD-10-CM | POA: Diagnosis not present

## 2023-03-28 DIAGNOSIS — I10 Essential (primary) hypertension: Secondary | ICD-10-CM | POA: Diagnosis not present

## 2023-03-28 DIAGNOSIS — Z7901 Long term (current) use of anticoagulants: Secondary | ICD-10-CM | POA: Insufficient documentation

## 2023-03-28 DIAGNOSIS — C779 Secondary and unspecified malignant neoplasm of lymph node, unspecified: Secondary | ICD-10-CM | POA: Diagnosis not present

## 2023-03-28 DIAGNOSIS — Z8249 Family history of ischemic heart disease and other diseases of the circulatory system: Secondary | ICD-10-CM | POA: Diagnosis not present

## 2023-03-28 DIAGNOSIS — Z5189 Encounter for other specified aftercare: Secondary | ICD-10-CM | POA: Insufficient documentation

## 2023-03-28 DIAGNOSIS — Z803 Family history of malignant neoplasm of breast: Secondary | ICD-10-CM | POA: Diagnosis not present

## 2023-03-28 DIAGNOSIS — C787 Secondary malignant neoplasm of liver and intrahepatic bile duct: Secondary | ICD-10-CM | POA: Diagnosis not present

## 2023-03-28 DIAGNOSIS — Z8349 Family history of other endocrine, nutritional and metabolic diseases: Secondary | ICD-10-CM | POA: Diagnosis not present

## 2023-03-28 DIAGNOSIS — C3490 Malignant neoplasm of unspecified part of unspecified bronchus or lung: Secondary | ICD-10-CM | POA: Diagnosis not present

## 2023-03-28 DIAGNOSIS — Z79899 Other long term (current) drug therapy: Secondary | ICD-10-CM | POA: Diagnosis not present

## 2023-03-28 DIAGNOSIS — Z90710 Acquired absence of both cervix and uterus: Secondary | ICD-10-CM | POA: Diagnosis not present

## 2023-03-28 DIAGNOSIS — C7931 Secondary malignant neoplasm of brain: Secondary | ICD-10-CM | POA: Insufficient documentation

## 2023-03-28 DIAGNOSIS — Z87891 Personal history of nicotine dependence: Secondary | ICD-10-CM | POA: Diagnosis not present

## 2023-03-28 DIAGNOSIS — T451X5A Adverse effect of antineoplastic and immunosuppressive drugs, initial encounter: Secondary | ICD-10-CM | POA: Diagnosis not present

## 2023-03-28 DIAGNOSIS — E049 Nontoxic goiter, unspecified: Secondary | ICD-10-CM | POA: Diagnosis not present

## 2023-03-28 DIAGNOSIS — Z5111 Encounter for antineoplastic chemotherapy: Secondary | ICD-10-CM | POA: Diagnosis not present

## 2023-03-28 DIAGNOSIS — Z86718 Personal history of other venous thrombosis and embolism: Secondary | ICD-10-CM | POA: Diagnosis not present

## 2023-03-28 DIAGNOSIS — D701 Agranulocytosis secondary to cancer chemotherapy: Secondary | ICD-10-CM | POA: Diagnosis not present

## 2023-03-28 DIAGNOSIS — E119 Type 2 diabetes mellitus without complications: Secondary | ICD-10-CM | POA: Insufficient documentation

## 2023-03-28 DIAGNOSIS — E876 Hypokalemia: Secondary | ICD-10-CM

## 2023-03-28 LAB — COMPREHENSIVE METABOLIC PANEL
ALT: 9 U/L (ref 0–44)
AST: 16 U/L (ref 15–41)
Albumin: 3.9 g/dL (ref 3.5–5.0)
Alkaline Phosphatase: 65 U/L (ref 38–126)
Anion gap: 9 (ref 5–15)
BUN: 13 mg/dL (ref 8–23)
CO2: 22 mmol/L (ref 22–32)
Calcium: 9.3 mg/dL (ref 8.9–10.3)
Chloride: 108 mmol/L (ref 98–111)
Creatinine, Ser: 0.57 mg/dL (ref 0.44–1.00)
GFR, Estimated: >60 mL/min (ref >60)
Glucose, Bld: 120 mg/dL — ABNORMAL HIGH (ref 70–99)
Potassium: 3.3 mmol/L — ABNORMAL LOW (ref 3.5–5.1)
Sodium: 139 mmol/L (ref 135–145)
Total Bilirubin: 0.7 mg/dL (ref 0.3–1.2)
Total Protein: 7 g/dL (ref 6.5–8.1)

## 2023-03-28 LAB — CBC WITH DIFFERENTIAL/PLATELET
Abs Immature Granulocytes: 0 10*3/uL (ref 0.00–0.07)
Basophils Absolute: 0 10*3/uL (ref 0.0–0.1)
Basophils Relative: 0 %
Eosinophils Absolute: 0 10*3/uL (ref 0.0–0.5)
Eosinophils Relative: 2 %
HCT: 34 % — ABNORMAL LOW (ref 36.0–46.0)
Hemoglobin: 11.2 g/dL — ABNORMAL LOW (ref 12.0–15.0)
Immature Granulocytes: 0 %
Lymphocytes Relative: 25 %
Lymphs Abs: 0.6 10*3/uL — ABNORMAL LOW (ref 0.7–4.0)
MCH: 31.3 pg (ref 26.0–34.0)
MCHC: 32.9 g/dL (ref 30.0–36.0)
MCV: 95 fL (ref 80.0–100.0)
Monocytes Absolute: 0.4 10*3/uL (ref 0.1–1.0)
Monocytes Relative: 14 %
Neutro Abs: 1.5 10*3/uL — ABNORMAL LOW (ref 1.7–7.7)
Neutrophils Relative %: 59 %
Platelets: 312 10*3/uL (ref 150–400)
RBC: 3.58 MIL/uL — ABNORMAL LOW (ref 3.87–5.11)
RDW: 14.7 % (ref 11.5–15.5)
WBC: 2.6 10*3/uL — ABNORMAL LOW (ref 4.0–10.5)
nRBC: 0 % (ref 0.0–0.2)

## 2023-03-28 MED ORDER — SODIUM CHLORIDE 0.9 % IV SOLN
10.0000 mg | Freq: Once | INTRAVENOUS | Status: AC
  Administered 2023-03-28: 10 mg via INTRAVENOUS
  Filled 2023-03-28: qty 10

## 2023-03-28 MED ORDER — HEPARIN SOD (PORK) LOCK FLUSH 100 UNIT/ML IV SOLN
500.0000 [IU] | Freq: Once | INTRAVENOUS | Status: AC | PRN
  Administered 2023-03-28: 500 [IU]
  Filled 2023-03-28: qty 5

## 2023-03-28 MED ORDER — SODIUM CHLORIDE 0.9 % IV SOLN
2.5600 mg/m2 | Freq: Once | INTRAVENOUS | Status: AC
  Administered 2023-03-28: 4.6 mg via INTRAVENOUS
  Filled 2023-03-28: qty 9.2

## 2023-03-28 MED ORDER — SODIUM CHLORIDE 0.9% FLUSH
10.0000 mL | Freq: Once | INTRAVENOUS | Status: AC
  Administered 2023-03-28: 10 mL via INTRAVENOUS
  Filled 2023-03-28: qty 10

## 2023-03-28 MED ORDER — SODIUM CHLORIDE 0.9 % IV SOLN
Freq: Once | INTRAVENOUS | Status: AC
  Filled 2023-03-28: qty 250

## 2023-03-28 MED ORDER — PALONOSETRON HCL INJECTION 0.25 MG/5ML
0.2500 mg | Freq: Once | INTRAVENOUS | Status: AC
  Administered 2023-03-28: 0.25 mg via INTRAVENOUS
  Filled 2023-03-28: qty 5

## 2023-03-28 NOTE — Progress Notes (Signed)
Hematology/Oncology Consult note [facility]  Telephone:([phone] [phone]  Patient Care Team: Mick Sell, MD as PCP - General (Infectious Diseases) Glory Buff, RN as Oncology Nurse Navigator Creig Hines, MD as Consulting Physician (Hematology and Oncology)   Name of the patient: Doris Johnson  [id number]  07/08/1951   Date of visit: 03/28/23  Diagnosis- extensive stage small cell lung cancer with liver lymph node and brain metastases   Chief complaint/ Reason for visit-on treatment assessment to cycle 11 of lurbinectedin  Heme/Onc history: Patient is a 72 year old female with a remote history of smoking in her teenage years and exposure to passive smoking.  She has been having ongoing midsternal pain which has been growing since the last 6 to 7 months.  She had been to urgent care as well in the past.  She finally had a CT chest with contrast done in October 2021 which showed a large mass in the left side of the mediastinum measuring 6.7 x 6.2 x 6.1 cm causing severe compression of the left main pulmonary artery and around the aortic arch in the left subclavian artery.  Enlarged thyroid gland.  Left upper lobe nodule 1 x 0.7 cm.  She then underwent bronchoscopy with Dr.Aleskerov left upper lobe biopsy was nondiagnostic.  However station 4, 7 and 10 L lymph nodes were consistent with metastatic small cell carcinoma.   PET CT scan showed enlarged level 3 cervical lymph node 2.2 cm that was hypermetabolic with an SUV of 9.7.  Large central 7.3 cm AP window mass.  5.7 cm left liver mass.  MRI brain with and without contrast showed 2 small foci of enhancement in the right cerebellar hemisphere and right temporal lobe without mass-effect or edema as well concerning for brain metastases   Patient had 2 cycles of carbo etoposide chemotherapy along with Tecentriq and subsequent scan showed no significant improvement in the primary lung mass or  liver mass.  She received radiation treatment to her primary lung mass and also seen by [facility] for second opinion.  Her pathology was reviewed at [facility] and reported as possible neuroendocrine neoplasm But stated that small cell carcinoma cannot be excluded but not definitely diagnostic for it.  However [facility] pathology feels that this is consistent with small cell lung cancer.  She has completed 4 cycles of carbo etoposide Tecentriq chemotherapy and is currently on maintenance Tecentriq.  Repeat liver biopsy was also performed and was consistent with small cell lung cancer   Disease progression after 23 cycles of maintenance Tecentriq in the left supraclavicular lymph node region.  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  Plan to switch patient to second line lurbinectedin  Interval history-tolerating treatments well so far.  Denies any cough, shortness of breath nausea vomiting diarrhea or chest pain.  ECOG PS- 1 Pain scale- 0   Review of systems- Review of Systems  Constitutional:  Negative for chills, fever, malaise/fatigue and weight loss.  HENT:  Negative for congestion, ear discharge and nosebleeds.   Eyes:  Negative for blurred vision.  Respiratory:  Negative for cough, hemoptysis, sputum production, shortness of breath and wheezing.   Cardiovascular:  Negative for chest pain, palpitations, orthopnea and claudication.  Gastrointestinal:  Negative for abdominal pain, blood in stool, constipation, diarrhea, heartburn, melena, nausea and vomiting.  Genitourinary:  Negative for dysuria, flank pain, frequency, hematuria and urgency.  Musculoskeletal:  Negative for back pain, joint pain and myalgias.  Skin:  Negative  for rash.  Neurological:  Negative for dizziness, tingling, focal weakness, seizures, weakness and headaches.  Endo/Heme/Allergies:  Does not bruise/bleed easily.  Psychiatric/Behavioral:  Negative for depression and suicidal ideas. The patient  does not have insomnia.       No Known Allergies   Past Medical History:  Diagnosis Date   Cancer (HCC)    Cataract    Diabetes mellitus without complication (HCC)    Hyperlipidemia    Hypertension    Hypothyroidism    doctor's keeping an eye on thyroid    Lung cancer [facility])      Past Surgical History:  Procedure Laterality Date   ABDOMINAL HYSTERECTOMY     IR CV LINE INJECTION  09/28/2021   MYRINGOTOMY WITH TUBE PLACEMENT Right 12/29/2022   Procedure: MYRINGOTOMY WITH TUBE PLACEMENT;  Surgeon: Vernie Murders, MD;  Location: [facility];  Service: ENT;  Laterality: Right;  Diabetic   PORTA CATH INSERTION N/A 11/30/2020   Procedure: PORTA CATH INSERTION;  Surgeon: Annice Needy, MD;  Location: [facility] INVASIVE CV LAB;  Service: Cardiovascular;  Laterality: N/A;   VIDEO BRONCHOSCOPY WITH ENDOBRONCHIAL NAVIGATION N/A 10/21/2020   Procedure: VIDEO BRONCHOSCOPY WITH ENDOBRONCHIAL NAVIGATION;  Surgeon: Vida Rigger, MD;  Location: [facility] ORS;  Service: Thoracic;  Laterality: N/A;   VIDEO BRONCHOSCOPY WITH ENDOBRONCHIAL ULTRASOUND N/A 10/21/2020   Procedure: VIDEO BRONCHOSCOPY WITH ENDOBRONCHIAL ULTRASOUND;  Surgeon: Vida Rigger, MD;  Location: [facility] ORS;  Service: Thoracic;  Laterality: N/A;    Social History   Socioeconomic History   Marital status: Single    Spouse name: Not on file   Number of children: Not on file   Years of education: Not on file   Highest education level: Not on file  Occupational History   Not on file  Tobacco Use   Smoking status: Never   Smokeless tobacco: Never  Vaping Use   Vaping Use: Never used  Substance and Sexual Activity   Alcohol use: No   Drug use: No   Sexual activity: Not Currently  Other Topics Concern   Not on file  Social History Narrative   Not on file   Social Determinants of Health   Financial Resource Strain: Not on file  Food Insecurity: Food Insecurity Present (07/08/2021)   Hunger Vital Sign    Worried About  Running Out of Food in the Last Year: Sometimes true    Ran Out of Food in the Last Year: Sometimes true  Transportation Needs: Unmet Transportation Needs (03/28/2023)   PRAPARE - Administrator, Civil Service (Medical): Yes    Lack of Transportation (Non-Medical): Yes  Physical Activity: Not on file  Stress: Not on file  Social Connections: Not on file  Intimate Partner Violence: Not on file    Family History  Problem Relation Age of Onset   Cancer Mother        breast   Hypertension Mother    Breast cancer Mother 30   Heart disease Father    Hyperlipidemia Brother    Heart disease Brother      Current Outpatient Medications:    dexamethasone (DECADRON) 4 MG tablet, Take 1 tablet (4 mg total) by mouth 2 (two) times daily with a meal., Disp: 60 tablet, Rfl: 1   ELIQUIS 5 MG TABS tablet, TAKE 1 TABLET(5 MG) BY MOUTH TWICE DAILY, Disp: 60 tablet, Rfl: 1   fluticasone (FLONASE) 50 MCG/ACT nasal spray, Place 2 sprays into both nostrils daily., Disp: 16 g, Rfl: 5  furosemide (LASIX) 20 MG tablet, Take 1 tablet (20 mg total) by mouth daily., Disp: 60 tablet, Rfl: 0   lidocaine-prilocaine (EMLA) cream, Apply 1 Application topically as needed. Apply small amount to port site at least 1 hour prior to it being accessed, cover with plastic wrap, Disp: 30 g, Rfl: 1   losartan (COZAAR) 25 MG tablet, Take 1 tablet by mouth daily., Disp: , Rfl:    metFORMIN (GLUCOPHAGE-XR) 500 MG 24 hr tablet, Take by mouth., Disp: , Rfl:    potassium chloride SA (KLOR-CON M) 20 MEQ tablet, Take 1 tablet by mouth daily., Disp: , Rfl:  No current facility-administered medications for this visit.  Facility-Administered Medications Ordered in Other Visits:    dexamethasone (DECADRON) 10 mg in sodium chloride 0.9 % 50 mL IVPB, 10 mg, Intravenous, Once, Creig Hines, MD, Last Rate: 204 mL/hr at 03/28/23 0939, 10 mg at 03/28/23 0939   heparin lock flush 100 unit/mL, 500 Units, Intracatheter, Once PRN,  Creig Hines, MD   lurbinectedin (ZEPZELCA) 4.6 mg in sodium chloride 0.9 % 250 mL chemo infusion, 2.56 mg/m2 (Treatment Plan Recorded), Intravenous, Once, Creig Hines, MD   palonosetron (ALOXI) injection 0.25 mg, 0.25 mg, Intravenous, Once, Creig Hines, MD   sodium chloride flush (NS) 0.9 % injection 10 mL, 10 mL, Intravenous, PRN, Creig Hines, MD, 10 mL at 12/15/20 0850   sodium chloride flush (NS) 0.9 % injection 10 mL, 10 mL, Intravenous, PRN, Creig Hines, MD, 10 mL at 03/07/23 0923  Physical exam:  Vitals:   03/28/23 0859  BP: 121/78  Pulse: 71  Resp: 18  Temp: 97.7 F (36.5 C)  TempSrc: Tympanic  SpO2: 100%  Weight: 162 lb 12.8 oz (73.8 kg)  Height: 5\' 6"  (1.676 m)   Physical Exam Cardiovascular:     Rate and Rhythm: Regular rhythm. Tachycardia present.     Heart sounds: Normal heart sounds.  Pulmonary:     Effort: Pulmonary effort is normal.     Breath sounds: Normal breath sounds.  Abdominal:     General: Bowel sounds are normal.     Palpations: Abdomen is soft.  Skin:    General: Skin is warm and dry.  Neurological:     Mental Status: She is alert and oriented to person, place, and time.         Latest Ref Rng & Units 03/28/2023    8:50 AM  CMP  Glucose 70 - 99 mg/dL 161   BUN 8 - 23 mg/dL 13   Creatinine 0.96 - 1.00 mg/dL 0.45   Sodium 409 - 811 mmol/L 139   Potassium 3.5 - 5.1 mmol/L 3.3   Chloride 98 - 111 mmol/L 108   CO2 22 - 32 mmol/L 22   Calcium 8.9 - 10.3 mg/dL 9.3   Total Protein 6.5 - 8.1 g/dL 7.0   Total Bilirubin 0.3 - 1.2 mg/dL 0.7   Alkaline Phos 38 - 126 U/L 65   AST 15 - 41 U/L 16   ALT 0 - 44 U/L 9       Latest Ref Rng & Units 03/28/2023    8:50 AM  CBC  WBC 4.0 - 10.5 K/uL 2.6   Hemoglobin 12.0 - 15.0 g/dL 91.4   Hematocrit 78.2 - 46.0 % 34.0   Platelets 150 - 400 K/uL 312      Assessment and plan- Patient is a 72 y.o. female with extensive stage small cell lung cancer with liver and  supraclavicular lymph node  metastases here for on treatment assessment prior to cycle 11 of lurbinectedin  White cell count is 2.6 today but ANC remains more than 1.  Okay to proceed with cycle 11 of lurbinectedin today and Udenyca on day 3.  I will see her back in 3 weeks for cycle 12.  Plan to repeat CT chest abdomen and pelvis with contrast in 5 to 6 weeks.  Hypokalemia: Patient will continue oral potassium twice a day for 1 week followed by once a day.  History of renal vein thrombosis: Continue Eliquis   Visit Diagnosis 1. Small cell carcinoma of lung metastatic to liver (HCC)   2. Encounter for antineoplastic chemotherapy      Dr. Owens Shark, MD, MPH [facility] [phone] 03/28/2023 9:43 AM

## 2023-03-28 NOTE — Patient Instructions (Signed)
[facility] [place] [facility]  Discharge Instructions: Thank you for choosing [facility] to provide your oncology and hematology care.  If you have a lab appointment with the Cancer Center, please go directly to the Cancer Center and check in at the registration area.  Wear comfortable clothing and clothing appropriate for easy access to any Portacath or PICC line.   We strive to give you quality time with your provider. You may need to reschedule your appointment if you arrive late (15 or more minutes).  Arriving late affects you and other patients whose appointments are after yours.  Also, if you miss three or more appointments without notifying the office, you may be dismissed from the clinic at the provider's discretion.      For prescription refill requests, have your pharmacy contact our office and allow 72 hours for refills to be completed.    Today you received the following chemotherapy and/or immunotherapy agents Zepzelca      To help prevent nausea and vomiting after your treatment, we encourage you to take your nausea medication as directed.  BELOW ARE SYMPTOMS THAT SHOULD BE REPORTED IMMEDIATELY: *FEVER GREATER THAN 100.4 F (38 C) OR HIGHER *CHILLS OR SWEATING *NAUSEA AND VOMITING THAT IS NOT CONTROLLED WITH YOUR NAUSEA MEDICATION *UNUSUAL SHORTNESS OF BREATH *UNUSUAL BRUISING OR BLEEDING *URINARY PROBLEMS (pain or burning when urinating, or frequent urination) *BOWEL PROBLEMS (unusual diarrhea, constipation, pain near the anus) TENDERNESS IN MOUTH AND THROAT WITH OR WITHOUT PRESENCE OF ULCERS (sore throat, sores in mouth, or a toothache) UNUSUAL RASH, SWELLING OR PAIN  UNUSUAL VAGINAL DISCHARGE OR ITCHING   Items with * indicate a potential emergency and should be followed up as soon as possible or go to the Emergency Department if any problems should occur.  Please show the CHEMOTHERAPY ALERT CARD or IMMUNOTHERAPY ALERT CARD at check-in to  the Emergency Department and triage nurse.  Should you have questions after your visit or need to cancel or reschedule your appointment, please contact [facility] [place] [facility]  [phone] and follow the prompts.  Office hours are 8:00 a.m. to 4:30 p.m. Monday - Friday. Please note that voicemails left after 4:00 p.m. may not be returned until the following business day.  We are closed weekends and major holidays. You have access to a nurse at all times for urgent questions. Please call the main number to the clinic [phone] and follow the prompts.  For any non-urgent questions, you may also contact your provider using MyChart. We now offer e-Visits for anyone 18 and older to request care online for non-urgent symptoms. For details visit [email or web address].   Also download the MyChart app! Go to the app store, search "MyChart", open the app, select , and log in with your MyChart username and password.    

## 2023-03-30 ENCOUNTER — Inpatient Hospital Stay: Payer: Medicare HMO

## 2023-03-30 DIAGNOSIS — E119 Type 2 diabetes mellitus without complications: Secondary | ICD-10-CM | POA: Diagnosis not present

## 2023-03-30 DIAGNOSIS — I10 Essential (primary) hypertension: Secondary | ICD-10-CM | POA: Diagnosis not present

## 2023-03-30 DIAGNOSIS — C7931 Secondary malignant neoplasm of brain: Secondary | ICD-10-CM | POA: Diagnosis not present

## 2023-03-30 DIAGNOSIS — C787 Secondary malignant neoplasm of liver and intrahepatic bile duct: Secondary | ICD-10-CM | POA: Diagnosis not present

## 2023-03-30 DIAGNOSIS — Z5111 Encounter for antineoplastic chemotherapy: Secondary | ICD-10-CM | POA: Diagnosis not present

## 2023-03-30 DIAGNOSIS — Z5189 Encounter for other specified aftercare: Secondary | ICD-10-CM | POA: Diagnosis not present

## 2023-03-30 DIAGNOSIS — C3412 Malignant neoplasm of upper lobe, left bronchus or lung: Secondary | ICD-10-CM | POA: Diagnosis not present

## 2023-03-30 DIAGNOSIS — E049 Nontoxic goiter, unspecified: Secondary | ICD-10-CM | POA: Diagnosis not present

## 2023-03-30 DIAGNOSIS — C779 Secondary and unspecified malignant neoplasm of lymph node, unspecified: Secondary | ICD-10-CM | POA: Diagnosis not present

## 2023-03-30 MED ORDER — PEGFILGRASTIM-CBQV 6 MG/0.6ML ~~LOC~~ SOSY
6.0000 mg | PREFILLED_SYRINGE | Freq: Once | SUBCUTANEOUS | Status: AC
  Administered 2023-03-30: 6 mg via SUBCUTANEOUS
  Filled 2023-03-30: qty 0.6

## 2023-04-17 MED FILL — Dexamethasone Sodium Phosphate Inj 100 MG/10ML: INTRAMUSCULAR | Qty: 1 | Status: AC

## 2023-04-18 ENCOUNTER — Inpatient Hospital Stay: Payer: Medicare HMO

## 2023-04-18 ENCOUNTER — Inpatient Hospital Stay (HOSPITAL_BASED_OUTPATIENT_CLINIC_OR_DEPARTMENT_OTHER): Payer: Medicare HMO | Admitting: Oncology

## 2023-04-18 ENCOUNTER — Other Ambulatory Visit: Payer: Self-pay | Admitting: *Deleted

## 2023-04-18 ENCOUNTER — Inpatient Hospital Stay: Payer: Medicare HMO | Attending: Oncology

## 2023-04-18 ENCOUNTER — Encounter: Payer: Self-pay | Admitting: Oncology

## 2023-04-18 VITALS — BP 141/73 | HR 60 | Temp 98.0°F | Resp 18 | Ht 66.0 in | Wt 166.6 lb

## 2023-04-18 DIAGNOSIS — Z7952 Long term (current) use of systemic steroids: Secondary | ICD-10-CM | POA: Insufficient documentation

## 2023-04-18 DIAGNOSIS — C787 Secondary malignant neoplasm of liver and intrahepatic bile duct: Secondary | ICD-10-CM

## 2023-04-18 DIAGNOSIS — Z86718 Personal history of other venous thrombosis and embolism: Secondary | ICD-10-CM | POA: Insufficient documentation

## 2023-04-18 DIAGNOSIS — Z90710 Acquired absence of both cervix and uterus: Secondary | ICD-10-CM | POA: Insufficient documentation

## 2023-04-18 DIAGNOSIS — C7931 Secondary malignant neoplasm of brain: Secondary | ICD-10-CM | POA: Insufficient documentation

## 2023-04-18 DIAGNOSIS — Z7901 Long term (current) use of anticoagulants: Secondary | ICD-10-CM | POA: Insufficient documentation

## 2023-04-18 DIAGNOSIS — C770 Secondary and unspecified malignant neoplasm of lymph nodes of head, face and neck: Secondary | ICD-10-CM | POA: Insufficient documentation

## 2023-04-18 DIAGNOSIS — I10 Essential (primary) hypertension: Secondary | ICD-10-CM | POA: Diagnosis not present

## 2023-04-18 DIAGNOSIS — E876 Hypokalemia: Secondary | ICD-10-CM | POA: Diagnosis not present

## 2023-04-18 DIAGNOSIS — C3412 Malignant neoplasm of upper lobe, left bronchus or lung: Secondary | ICD-10-CM | POA: Insufficient documentation

## 2023-04-18 DIAGNOSIS — D701 Agranulocytosis secondary to cancer chemotherapy: Secondary | ICD-10-CM | POA: Diagnosis not present

## 2023-04-18 DIAGNOSIS — Z79899 Other long term (current) drug therapy: Secondary | ICD-10-CM | POA: Diagnosis not present

## 2023-04-18 DIAGNOSIS — Z7722 Contact with and (suspected) exposure to environmental tobacco smoke (acute) (chronic): Secondary | ICD-10-CM | POA: Insufficient documentation

## 2023-04-18 DIAGNOSIS — T451X5A Adverse effect of antineoplastic and immunosuppressive drugs, initial encounter: Secondary | ICD-10-CM

## 2023-04-18 DIAGNOSIS — Z87891 Personal history of nicotine dependence: Secondary | ICD-10-CM | POA: Insufficient documentation

## 2023-04-18 DIAGNOSIS — E049 Nontoxic goiter, unspecified: Secondary | ICD-10-CM | POA: Insufficient documentation

## 2023-04-18 DIAGNOSIS — Z5111 Encounter for antineoplastic chemotherapy: Secondary | ICD-10-CM | POA: Insufficient documentation

## 2023-04-18 DIAGNOSIS — C3490 Malignant neoplasm of unspecified part of unspecified bronchus or lung: Secondary | ICD-10-CM

## 2023-04-18 LAB — CBC WITH DIFFERENTIAL/PLATELET
Abs Immature Granulocytes: 0.01 10*3/uL (ref 0.00–0.07)
Basophils Absolute: 0 10*3/uL (ref 0.0–0.1)
Basophils Relative: 0 %
Eosinophils Absolute: 0 10*3/uL (ref 0.0–0.5)
Eosinophils Relative: 1 %
HCT: 35.8 % — ABNORMAL LOW (ref 36.0–46.0)
Hemoglobin: 11.6 g/dL — ABNORMAL LOW (ref 12.0–15.0)
Immature Granulocytes: 0 %
Lymphocytes Relative: 26 %
Lymphs Abs: 0.6 10*3/uL — ABNORMAL LOW (ref 0.7–4.0)
MCH: 30.8 pg (ref 26.0–34.0)
MCHC: 32.4 g/dL (ref 30.0–36.0)
MCV: 95 fL (ref 80.0–100.0)
Monocytes Absolute: 0.4 10*3/uL (ref 0.1–1.0)
Monocytes Relative: 18 %
Neutro Abs: 1.2 10*3/uL — ABNORMAL LOW (ref 1.7–7.7)
Neutrophils Relative %: 55 %
Platelets: 299 10*3/uL (ref 150–400)
RBC: 3.77 MIL/uL — ABNORMAL LOW (ref 3.87–5.11)
RDW: 14.6 % (ref 11.5–15.5)
WBC: 2.2 10*3/uL — ABNORMAL LOW (ref 4.0–10.5)
nRBC: 0 % (ref 0.0–0.2)

## 2023-04-18 LAB — COMPREHENSIVE METABOLIC PANEL
ALT: 11 U/L (ref 0–44)
AST: 14 U/L — ABNORMAL LOW (ref 15–41)
Albumin: 4.2 g/dL (ref 3.5–5.0)
Alkaline Phosphatase: 66 U/L (ref 38–126)
Anion gap: 7 (ref 5–15)
BUN: 12 mg/dL (ref 8–23)
CO2: 25 mmol/L (ref 22–32)
Calcium: 9.2 mg/dL (ref 8.9–10.3)
Chloride: 108 mmol/L (ref 98–111)
Creatinine, Ser: 0.51 mg/dL (ref 0.44–1.00)
GFR, Estimated: >60 mL/min (ref >60)
Glucose, Bld: 97 mg/dL (ref 70–99)
Potassium: 3.4 mmol/L — ABNORMAL LOW (ref 3.5–5.1)
Sodium: 140 mmol/L (ref 135–145)
Total Bilirubin: 0.6 mg/dL (ref 0.3–1.2)
Total Protein: 7.3 g/dL (ref 6.5–8.1)

## 2023-04-18 MED ORDER — HEPARIN SOD (PORK) LOCK FLUSH 100 UNIT/ML IV SOLN
500.0000 [IU] | Freq: Once | INTRAVENOUS | Status: AC | PRN
  Administered 2023-04-18: 500 [IU]
  Filled 2023-04-18: qty 5

## 2023-04-18 MED ORDER — SODIUM CHLORIDE 0.9 % IV SOLN
10.0000 mg | Freq: Once | INTRAVENOUS | Status: AC
  Administered 2023-04-18: 10 mg via INTRAVENOUS
  Filled 2023-04-18: qty 10

## 2023-04-18 MED ORDER — PALONOSETRON HCL INJECTION 0.25 MG/5ML
0.2500 mg | Freq: Once | INTRAVENOUS | Status: AC
  Administered 2023-04-18: 0.25 mg via INTRAVENOUS

## 2023-04-18 MED ORDER — SODIUM CHLORIDE 0.9 % IV SOLN
Freq: Once | INTRAVENOUS | Status: AC
  Filled 2023-04-18: qty 250

## 2023-04-18 MED ORDER — SODIUM CHLORIDE 0.9 % IV SOLN
2.5600 mg/m2 | Freq: Once | INTRAVENOUS | Status: AC
  Administered 2023-04-18: 4.6 mg via INTRAVENOUS
  Filled 2023-04-18: qty 9.2

## 2023-04-18 NOTE — Progress Notes (Signed)
Hematology/Oncology Consult note [facility]  Telephone:([phone] Fax:(336) [phone]  Patient Care Team: Mick Sell, MD as PCP - General (Infectious Diseases) Glory Buff, RN as Oncology Nurse Navigator Creig Hines, MD as Consulting Physician (Hematology and Oncology)   Name of the patient: Doris Johnson  [id number]  Nov 28, 1950   Date of visit: 04/18/23  Diagnosis- extensive stage small cell lung cancer with liver lymph node and brain metastases   Chief complaint/ Reason for visit-on treatment assessment prior to cycle 12 of lurbinectedin  Heme/Onc history: Patient is a 72 year old female with a remote history of smoking in her teenage years and exposure to passive smoking.  She has been having ongoing midsternal pain which has been growing since the last 6 to 7 months.  She had been to urgent care as well in the past.  She finally had a CT chest with contrast done in October 2021 which showed a large mass in the left side of the mediastinum measuring 6.7 x 6.2 x 6.1 cm causing severe compression of the left main pulmonary artery and around the aortic arch in the left subclavian artery.  Enlarged thyroid gland.  Left upper lobe nodule 1 x 0.7 cm.  She then underwent bronchoscopy with Dr.Aleskerov left upper lobe biopsy was nondiagnostic.  However station 4, 7 and 10 L lymph nodes were consistent with metastatic small cell carcinoma.   PET CT scan showed enlarged level 3 cervical lymph node 2.2 cm that was hypermetabolic with an SUV of 9.7.  Large central 7.3 cm AP window mass.  5.7 cm left liver mass.  MRI brain with and without contrast showed 2 small foci of enhancement in the right cerebellar hemisphere and right temporal lobe without mass-effect or edema as well concerning for brain metastases   Patient had 2 cycles of carbo etoposide chemotherapy along with Tecentriq and subsequent scan showed no significant improvement in the primary lung  mass or liver mass.  She received radiation treatment to her primary lung mass and also seen by [facility] for second opinion.  Her pathology was reviewed at [facility] and reported as possible neuroendocrine neoplasm But stated that small cell carcinoma cannot be excluded but not definitely diagnostic for it.  However [facility] pathology feels that this is consistent with small cell lung cancer.  She has completed 4 cycles of carbo etoposide Tecentriq chemotherapy and is currently on maintenance Tecentriq.  Repeat liver biopsy was also performed and was consistent with small cell lung cancer   Disease progression after 23 cycles of maintenance Tecentriq in the left supraclavicular lymph node region.  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  Plan to switch patient to second line lurbinectedin  Interval history-tolerating chemotherapy well and denies any significant complaints at this time.Appetite and weight remain stable.  ECOG PS- 1 Pain scale- 0 Opioid associated constipation- no  Review of systems- Review of Systems  Constitutional:  Negative for chills, fever, malaise/fatigue and weight loss.  HENT:  Negative for congestion, ear discharge and nosebleeds.   Eyes:  Negative for blurred vision.  Respiratory:  Negative for cough, hemoptysis, sputum production, shortness of breath and wheezing.   Cardiovascular:  Negative for chest pain, palpitations, orthopnea and claudication.  Gastrointestinal:  Negative for abdominal pain, blood in stool, constipation, diarrhea, heartburn, melena, nausea and vomiting.  Genitourinary:  Negative for dysuria, flank pain, frequency, hematuria and urgency.  Musculoskeletal:  Negative for back pain, joint pain and myalgias.  Skin:  Negative for rash.  Neurological:  Negative for dizziness, tingling, focal weakness, seizures, weakness and headaches.  Endo/Heme/Allergies:  Does not bruise/bleed easily.  Psychiatric/Behavioral:  Negative for  depression and suicidal ideas. The patient does not have insomnia.       No Known Allergies   Past Medical History:  Diagnosis Date   Cancer (HCC)    Cataract    Diabetes mellitus without complication (HCC)    Hyperlipidemia    Hypertension    Hypothyroidism    doctor's keeping an eye on thyroid    Lung cancer [facility])      Past Surgical History:  Procedure Laterality Date   ABDOMINAL HYSTERECTOMY     IR CV LINE INJECTION  09/28/2021   MYRINGOTOMY WITH TUBE PLACEMENT Right 12/29/2022   Procedure: MYRINGOTOMY WITH TUBE PLACEMENT;  Surgeon: Vernie Murders, MD;  Location: [facility];  Service: ENT;  Laterality: Right;  Diabetic   PORTA CATH INSERTION N/A 11/30/2020   Procedure: PORTA CATH INSERTION;  Surgeon: Annice Needy, MD;  Location: [facility] INVASIVE CV LAB;  Service: Cardiovascular;  Laterality: N/A;   VIDEO BRONCHOSCOPY WITH ENDOBRONCHIAL NAVIGATION N/A 10/21/2020   Procedure: VIDEO BRONCHOSCOPY WITH ENDOBRONCHIAL NAVIGATION;  Surgeon: Vida Rigger, MD;  Location: [facility] ORS;  Service: Thoracic;  Laterality: N/A;   VIDEO BRONCHOSCOPY WITH ENDOBRONCHIAL ULTRASOUND N/A 10/21/2020   Procedure: VIDEO BRONCHOSCOPY WITH ENDOBRONCHIAL ULTRASOUND;  Surgeon: Vida Rigger, MD;  Location: [facility] ORS;  Service: Thoracic;  Laterality: N/A;    Social History   Socioeconomic History   Marital status: Single    Spouse name: Not on file   Number of children: Not on file   Years of education: Not on file   Highest education level: Not on file  Occupational History   Not on file  Tobacco Use   Smoking status: Never   Smokeless tobacco: Never  Vaping Use   Vaping Use: Never used  Substance and Sexual Activity   Alcohol use: No   Drug use: No   Sexual activity: Not Currently  Other Topics Concern   Not on file  Social History Narrative   Not on file   Social Determinants of Health   Financial Resource Strain: Not on file  Food Insecurity: Food Insecurity Present (07/08/2021)    Hunger Vital Sign    Worried About Running Out of Food in the Last Year: Sometimes true    Ran Out of Food in the Last Year: Sometimes true  Transportation Needs: Unmet Transportation Needs (03/28/2023)   PRAPARE - Administrator, Civil Service (Medical): Yes    Lack of Transportation (Non-Medical): Yes  Physical Activity: Not on file  Stress: Not on file  Social Connections: Not on file  Intimate Partner Violence: Not on file    Family History  Problem Relation Age of Onset   Cancer Mother        breast   Hypertension Mother    Breast cancer Mother 22   Heart disease Father    Hyperlipidemia Brother    Heart disease Brother      Current Outpatient Medications:    dexamethasone (DECADRON) 4 MG tablet, Take 1 tablet (4 mg total) by mouth 2 (two) times daily with a meal., Disp: 60 tablet, Rfl: 1   ELIQUIS 5 MG TABS tablet, TAKE 1 TABLET(5 MG) BY MOUTH TWICE DAILY, Disp: 60 tablet, Rfl: 1   fluticasone (FLONASE) 50 MCG/ACT nasal spray, Place 2 sprays into both nostrils daily., Disp: 16 g, Rfl:  5   furosemide (LASIX) 20 MG tablet, Take 1 tablet (20 mg total) by mouth daily., Disp: 60 tablet, Rfl: 0   lidocaine-prilocaine (EMLA) cream, Apply 1 Application topically as needed. Apply small amount to port site at least 1 hour prior to it being accessed, cover with plastic wrap, Disp: 30 g, Rfl: 1   losartan (COZAAR) 25 MG tablet, Take 1 tablet by mouth daily., Disp: , Rfl:    metFORMIN (GLUCOPHAGE-XR) 500 MG 24 hr tablet, Take by mouth., Disp: , Rfl:    potassium chloride SA (KLOR-CON M) 20 MEQ tablet, Take 1 tablet by mouth daily., Disp: , Rfl:  No current facility-administered medications for this visit.  Facility-Administered Medications Ordered in Other Visits:    heparin lock flush 100 unit/mL, 500 Units, Intracatheter, Once PRN, Creig Hines, MD   sodium chloride flush (NS) 0.9 % injection 10 mL, 10 mL, Intravenous, PRN, Creig Hines, MD, 10 mL at 12/15/20 0850    sodium chloride flush (NS) 0.9 % injection 10 mL, 10 mL, Intravenous, PRN, Creig Hines, MD, 10 mL at 03/07/23 0923  Physical exam:  Vitals:   04/18/23 0935 04/18/23 0941  BP: (!) 147/80 (!) 141/73  Pulse: (!) 58 60  Resp: 18   Temp: 98 F (36.7 C)   TempSrc: Tympanic   SpO2: 100%   Weight: 166 lb 9.6 oz (75.6 kg)   Height: 5\' 6"  (1.676 m)    Physical Exam Cardiovascular:     Rate and Rhythm: Normal rate and regular rhythm.     Heart sounds: Normal heart sounds.  Pulmonary:     Effort: Pulmonary effort is normal.     Breath sounds: Normal breath sounds.  Abdominal:     General: Bowel sounds are normal.     Palpations: Abdomen is soft.  Skin:    General: Skin is warm and dry.  Neurological:     Mental Status: She is alert and oriented to person, place, and time.         Latest Ref Rng & Units 04/18/2023    9:20 AM  CMP  Glucose 70 - 99 mg/dL 97   BUN 8 - 23 mg/dL 12   Creatinine 1.61 - 1.00 mg/dL 0.96   Sodium 045 - 409 mmol/L 140   Potassium 3.5 - 5.1 mmol/L 3.4   Chloride 98 - 111 mmol/L 108   CO2 22 - 32 mmol/L 25   Calcium 8.9 - 10.3 mg/dL 9.2   Total Protein 6.5 - 8.1 g/dL 7.3   Total Bilirubin 0.3 - 1.2 mg/dL 0.6   Alkaline Phos 38 - 126 U/L 66   AST 15 - 41 U/L 14   ALT 0 - 44 U/L 11       Latest Ref Rng & Units 04/18/2023    9:20 AM  CBC  WBC 4.0 - 10.5 K/uL 2.2   Hemoglobin 12.0 - 15.0 g/dL 81.1   Hematocrit 91.4 - 46.0 % 35.8   Platelets 150 - 400 K/uL 299      Assessment and plan- Patient is a 72 y.o. female  with extensive stage small cell lung cancer with liver and supraclavicular lymph node metastases .  She is here for on treatment assessment prior to cycle 12 of lurbinectedin  White cell count is 2.2 today but ANC remains more than 1.  Counts okay to proceed with cycle 12 of lurbinectedin today with urine leak on day 3.  She will be seen by covering provider  in 3 weeks for cycle 13 and I will see her back in 6 weeks for cycle 14.  Plan to  repeat scan in 7 to 8 weeks from now.  Hypokalemia: Continue oral potassium  History of renal vein thrombosis: Continue Eliquis   Visit Diagnosis 1. Small cell carcinoma of lung metastatic to liver (HCC)   2. Encounter for antineoplastic chemotherapy   3. Chemotherapy induced neutropenia (HCC)   4. Hypokalemia      Dr. Owens Shark, MD, MPH [facility] [phone] 04/18/2023 12:47 PM

## 2023-04-18 NOTE — Patient Instructions (Signed)
[facility] [place] [facility]  Discharge Instructions: Thank you for choosing [facility] to provide your oncology and hematology care.  If you have a lab appointment with the Cancer Center, please go directly to the Cancer Center and check in at the registration area.  Wear comfortable clothing and clothing appropriate for easy access to any Portacath or PICC line.   We strive to give you quality time with your provider. You may need to reschedule your appointment if you arrive late (15 or more minutes).  Arriving late affects you and other patients whose appointments are after yours.  Also, if you miss three or more appointments without notifying the office, you may be dismissed from the clinic at the provider's discretion.      For prescription refill requests, have your pharmacy contact our office and allow 72 hours for refills to be completed.     To help prevent nausea and vomiting after your treatment, we encourage you to take your nausea medication as directed.  BELOW ARE SYMPTOMS THAT SHOULD BE REPORTED IMMEDIATELY: *FEVER GREATER THAN 100.4 F (38 C) OR HIGHER *CHILLS OR SWEATING *NAUSEA AND VOMITING THAT IS NOT CONTROLLED WITH YOUR NAUSEA MEDICATION *UNUSUAL SHORTNESS OF BREATH *UNUSUAL BRUISING OR BLEEDING *URINARY PROBLEMS (pain or burning when urinating, or frequent urination) *BOWEL PROBLEMS (unusual diarrhea, constipation, pain near the anus) TENDERNESS IN MOUTH AND THROAT WITH OR WITHOUT PRESENCE OF ULCERS (sore throat, sores in mouth, or a toothache) UNUSUAL RASH, SWELLING OR PAIN  UNUSUAL VAGINAL DISCHARGE OR ITCHING   Items with * indicate a potential emergency and should be followed up as soon as possible or go to the Emergency Department if any problems should occur.  Please show the CHEMOTHERAPY ALERT CARD or IMMUNOTHERAPY ALERT CARD at check-in to the Emergency Department and triage nurse.  Should you have questions after your visit  or need to cancel or reschedule your appointment, please contact [facility] [place] [facility]  [phone] and follow the prompts.  Office hours are 8:00 a.m. to 4:30 p.m. Monday - Friday. Please note that voicemails left after 4:00 p.m. may not be returned until the following business day.  We are closed weekends and major holidays. You have access to a nurse at all times for urgent questions. Please call the main number to the clinic [phone] and follow the prompts.  For any non-urgent questions, you may also contact your provider using MyChart. We now offer e-Visits for anyone 18 and older to request care online for non-urgent symptoms. For details visit [email or web address].   Also download the MyChart app! Go to the app store, search "MyChart", open the app, select [place], and log in with your MyChart username and password.    

## 2023-04-20 ENCOUNTER — Inpatient Hospital Stay: Payer: Medicare HMO

## 2023-04-20 DIAGNOSIS — C770 Secondary and unspecified malignant neoplasm of lymph nodes of head, face and neck: Secondary | ICD-10-CM | POA: Diagnosis not present

## 2023-04-20 DIAGNOSIS — C787 Secondary malignant neoplasm of liver and intrahepatic bile duct: Secondary | ICD-10-CM | POA: Diagnosis not present

## 2023-04-20 DIAGNOSIS — Z87891 Personal history of nicotine dependence: Secondary | ICD-10-CM | POA: Diagnosis not present

## 2023-04-20 DIAGNOSIS — Z7722 Contact with and (suspected) exposure to environmental tobacco smoke (acute) (chronic): Secondary | ICD-10-CM | POA: Diagnosis not present

## 2023-04-20 DIAGNOSIS — T451X5A Adverse effect of antineoplastic and immunosuppressive drugs, initial encounter: Secondary | ICD-10-CM | POA: Diagnosis not present

## 2023-04-20 DIAGNOSIS — C7931 Secondary malignant neoplasm of brain: Secondary | ICD-10-CM | POA: Diagnosis not present

## 2023-04-20 DIAGNOSIS — Z5111 Encounter for antineoplastic chemotherapy: Secondary | ICD-10-CM | POA: Diagnosis not present

## 2023-04-20 DIAGNOSIS — D701 Agranulocytosis secondary to cancer chemotherapy: Secondary | ICD-10-CM | POA: Diagnosis not present

## 2023-04-20 DIAGNOSIS — C3412 Malignant neoplasm of upper lobe, left bronchus or lung: Secondary | ICD-10-CM | POA: Diagnosis not present

## 2023-04-20 MED ORDER — PEGFILGRASTIM-CBQV 6 MG/0.6ML ~~LOC~~ SOSY
6.0000 mg | PREFILLED_SYRINGE | Freq: Once | SUBCUTANEOUS | Status: AC
  Administered 2023-04-20: 6 mg via SUBCUTANEOUS
  Filled 2023-04-20: qty 0.6

## 2023-05-08 MED FILL — Dexamethasone Sodium Phosphate Inj 100 MG/10ML: INTRAMUSCULAR | Qty: 1 | Status: AC

## 2023-05-09 ENCOUNTER — Encounter: Payer: Self-pay | Admitting: Nurse Practitioner

## 2023-05-09 ENCOUNTER — Inpatient Hospital Stay: Payer: Medicare HMO

## 2023-05-09 ENCOUNTER — Inpatient Hospital Stay (HOSPITAL_BASED_OUTPATIENT_CLINIC_OR_DEPARTMENT_OTHER): Payer: Medicare HMO | Admitting: Nurse Practitioner

## 2023-05-09 VITALS — BP 147/75 | HR 64 | Temp 96.9°F | Resp 18 | Ht 66.0 in | Wt 163.6 lb

## 2023-05-09 VITALS — BP 132/72 | HR 54 | Resp 16

## 2023-05-09 DIAGNOSIS — C3490 Malignant neoplasm of unspecified part of unspecified bronchus or lung: Secondary | ICD-10-CM | POA: Diagnosis not present

## 2023-05-09 DIAGNOSIS — C7931 Secondary malignant neoplasm of brain: Secondary | ICD-10-CM | POA: Diagnosis not present

## 2023-05-09 DIAGNOSIS — C770 Secondary and unspecified malignant neoplasm of lymph nodes of head, face and neck: Secondary | ICD-10-CM | POA: Diagnosis not present

## 2023-05-09 DIAGNOSIS — Z7722 Contact with and (suspected) exposure to environmental tobacco smoke (acute) (chronic): Secondary | ICD-10-CM | POA: Diagnosis not present

## 2023-05-09 DIAGNOSIS — C787 Secondary malignant neoplasm of liver and intrahepatic bile duct: Secondary | ICD-10-CM

## 2023-05-09 DIAGNOSIS — E876 Hypokalemia: Secondary | ICD-10-CM

## 2023-05-09 DIAGNOSIS — C3412 Malignant neoplasm of upper lobe, left bronchus or lung: Secondary | ICD-10-CM | POA: Diagnosis not present

## 2023-05-09 DIAGNOSIS — Z5111 Encounter for antineoplastic chemotherapy: Secondary | ICD-10-CM

## 2023-05-09 DIAGNOSIS — D701 Agranulocytosis secondary to cancer chemotherapy: Secondary | ICD-10-CM | POA: Diagnosis not present

## 2023-05-09 DIAGNOSIS — T451X5A Adverse effect of antineoplastic and immunosuppressive drugs, initial encounter: Secondary | ICD-10-CM | POA: Diagnosis not present

## 2023-05-09 DIAGNOSIS — Z87891 Personal history of nicotine dependence: Secondary | ICD-10-CM | POA: Diagnosis not present

## 2023-05-09 LAB — COMPREHENSIVE METABOLIC PANEL
ALT: 9 U/L (ref 0–44)
AST: 13 U/L — ABNORMAL LOW (ref 15–41)
Albumin: 4.1 g/dL (ref 3.5–5.0)
Alkaline Phosphatase: 66 U/L (ref 38–126)
Anion gap: 9 (ref 5–15)
BUN: 14 mg/dL (ref 8–23)
CO2: 24 mmol/L (ref 22–32)
Calcium: 9.5 mg/dL (ref 8.9–10.3)
Chloride: 108 mmol/L (ref 98–111)
Creatinine, Ser: 0.57 mg/dL (ref 0.44–1.00)
GFR, Estimated: >60 mL/min (ref >60)
Glucose, Bld: 103 mg/dL — ABNORMAL HIGH (ref 70–99)
Potassium: 3.4 mmol/L — ABNORMAL LOW (ref 3.5–5.1)
Sodium: 141 mmol/L (ref 135–145)
Total Bilirubin: 0.7 mg/dL (ref 0.3–1.2)
Total Protein: 7.1 g/dL (ref 6.5–8.1)

## 2023-05-09 LAB — CBC WITH DIFFERENTIAL/PLATELET
Abs Immature Granulocytes: 0.01 10*3/uL (ref 0.00–0.07)
Basophils Absolute: 0 10*3/uL (ref 0.0–0.1)
Basophils Relative: 0 %
Eosinophils Absolute: 0.1 10*3/uL (ref 0.0–0.5)
Eosinophils Relative: 3 %
HCT: 34.8 % — ABNORMAL LOW (ref 36.0–46.0)
Hemoglobin: 11.5 g/dL — ABNORMAL LOW (ref 12.0–15.0)
Immature Granulocytes: 0 %
Lymphocytes Relative: 20 %
Lymphs Abs: 0.5 10*3/uL — ABNORMAL LOW (ref 0.7–4.0)
MCH: 31.2 pg (ref 26.0–34.0)
MCHC: 33 g/dL (ref 30.0–36.0)
MCV: 94.3 fL (ref 80.0–100.0)
Monocytes Absolute: 0.5 10*3/uL (ref 0.1–1.0)
Monocytes Relative: 21 %
Neutro Abs: 1.4 10*3/uL — ABNORMAL LOW (ref 1.7–7.7)
Neutrophils Relative %: 56 %
Platelets: 297 10*3/uL (ref 150–400)
RBC: 3.69 MIL/uL — ABNORMAL LOW (ref 3.87–5.11)
RDW: 14.5 % (ref 11.5–15.5)
WBC: 2.5 10*3/uL — ABNORMAL LOW (ref 4.0–10.5)
nRBC: 0 % (ref 0.0–0.2)

## 2023-05-09 MED ORDER — POTASSIUM CHLORIDE CRYS ER 20 MEQ PO TBCR: 20.0000 meq | EXTENDED_RELEASE_TABLET | Freq: Every day | ORAL | 1 refills | Status: DC

## 2023-05-09 MED ORDER — APIXABAN 5 MG PO TABS: 5.0000 mg | ORAL_TABLET | Freq: Two times a day (BID) | ORAL | 1 refills | Status: AC

## 2023-05-09 MED ORDER — SODIUM CHLORIDE 0.9 % IV SOLN
2.5600 mg/m2 | Freq: Once | INTRAVENOUS | Status: AC
  Administered 2023-05-09: 4.6 mg via INTRAVENOUS
  Filled 2023-05-09: qty 9.2

## 2023-05-09 MED ORDER — SODIUM CHLORIDE 0.9 % IV SOLN
Freq: Once | INTRAVENOUS | Status: AC
  Filled 2023-05-09: qty 250

## 2023-05-09 MED ORDER — PALONOSETRON HCL INJECTION 0.25 MG/5ML
0.2500 mg | Freq: Once | INTRAVENOUS | Status: AC
  Administered 2023-05-09: 0.25 mg via INTRAVENOUS
  Filled 2023-05-09: qty 5

## 2023-05-09 MED ORDER — HEPARIN SOD (PORK) LOCK FLUSH 100 UNIT/ML IV SOLN
500.0000 [IU] | Freq: Once | INTRAVENOUS | Status: AC | PRN
  Administered 2023-05-09: 500 [IU]
  Filled 2023-05-09: qty 5

## 2023-05-09 MED ORDER — SODIUM CHLORIDE 0.9 % IV SOLN
10.0000 mg | Freq: Once | INTRAVENOUS | Status: AC
  Administered 2023-05-09: 10 mg via INTRAVENOUS
  Filled 2023-05-09: qty 10

## 2023-05-09 NOTE — Patient Instructions (Signed)
[facility] [place] [facility]  Discharge Instructions: Thank you for choosing [facility] to provide your oncology and hematology care.  If you have a lab appointment with the Cancer Center, please go directly to the Cancer Center and check in at the registration area.  Wear comfortable clothing and clothing appropriate for easy access to any Portacath or PICC line.   We strive to give you quality time with your provider. You may need to reschedule your appointment if you arrive late (15 or more minutes).  Arriving late affects you and other patients whose appointments are after yours.  Also, if you miss three or more appointments without notifying the office, you may be dismissed from the clinic at the provider's discretion.      For prescription refill requests, have your pharmacy contact our office and allow 72 hours for refills to be completed.     To help prevent nausea and vomiting after your treatment, we encourage you to take your nausea medication as directed.  BELOW ARE SYMPTOMS THAT SHOULD BE REPORTED IMMEDIATELY: *FEVER GREATER THAN 100.4 F (38 C) OR HIGHER *CHILLS OR SWEATING *NAUSEA AND VOMITING THAT IS NOT CONTROLLED WITH YOUR NAUSEA MEDICATION *UNUSUAL SHORTNESS OF BREATH *UNUSUAL BRUISING OR BLEEDING *URINARY PROBLEMS (pain or burning when urinating, or frequent urination) *BOWEL PROBLEMS (unusual diarrhea, constipation, pain near the anus) TENDERNESS IN MOUTH AND THROAT WITH OR WITHOUT PRESENCE OF ULCERS (sore throat, sores in mouth, or a toothache) UNUSUAL RASH, SWELLING OR PAIN  UNUSUAL VAGINAL DISCHARGE OR ITCHING   Items with * indicate a potential emergency and should be followed up as soon as possible or go to the Emergency Department if any problems should occur.  Please show the CHEMOTHERAPY ALERT CARD or IMMUNOTHERAPY ALERT CARD at check-in to the Emergency Department and triage nurse.  Should you have questions after your visit  or need to cancel or reschedule your appointment, please contact [facility] [place] [facility]  [phone] and follow the prompts.  Office hours are 8:00 a.m. to 4:30 p.m. Monday - Friday. Please note that voicemails left after 4:00 p.m. may not be returned until the following business day.  We are closed weekends and major holidays. You have access to a nurse at all times for urgent questions. Please call the main number to the clinic [phone] and follow the prompts.  For any non-urgent questions, you may also contact your provider using MyChart. We now offer e-Visits for anyone 18 and older to request care online for non-urgent symptoms. For details visit [email or web address].   Also download the MyChart app! Go to the app store, search "MyChart", open the app, select [place], and log in with your MyChart username and password.    

## 2023-05-09 NOTE — Progress Notes (Signed)
Hematology/Oncology Consult Note [facility]  Telephone:([phone] [phone]  Patient Care Team: Mick Sell, MD as PCP - General (Infectious Diseases) Glory Buff, RN as Oncology Nurse Navigator Creig Hines, MD as Consulting Physician (Hematology and Oncology)   Name of the patient: Doris Johnson  [id number]  Nov 19, 1950   Date of visit: 05/09/23  Diagnosis- extensive stage small cell lung cancer with liver lymph node and brain metastases   Chief complaint/ Reason for visit-on treatment assessment prior to cycle 14 of lurbinectedin  Heme/Onc history: Patient is a 72 year old female with a remote history of smoking in her teenage years and exposure to passive smoking.  She has been having ongoing midsternal pain which has been growing since the last 6 to 7 months.  She had been to urgent care as well in the past.  She finally had a CT chest with contrast done in October 2021 which showed a large mass in the left side of the mediastinum measuring 6.7 x 6.2 x 6.1 cm causing severe compression of the left main pulmonary artery and around the aortic arch in the left subclavian artery.  Enlarged thyroid gland.  Left upper lobe nodule 1 x 0.7 cm.  She then underwent bronchoscopy with Dr.Aleskerov left upper lobe biopsy was nondiagnostic.  However station 4, 7 and 10 L lymph nodes were consistent with metastatic small cell carcinoma.   PET CT scan showed enlarged level 3 cervical lymph node 2.2 cm that was hypermetabolic with an SUV of 9.7.  Large central 7.3 cm AP window mass.  5.7 cm left liver mass.  MRI brain with and without contrast showed 2 small foci of enhancement in the right cerebellar hemisphere and right temporal lobe without mass-effect or edema as well concerning for brain metastases   Patient had 2 cycles of carbo etoposide chemotherapy along with Tecentriq and subsequent scan showed no significant improvement in the primary lung mass or  liver mass.  She received radiation treatment to her primary lung mass and also seen by [facility] for second opinion.  Her pathology was reviewed at [facility] and reported as possible neuroendocrine neoplasm But stated that small cell carcinoma cannot be excluded but not definitely diagnostic for it.  However [name] pathology feels that this is consistent with small cell lung cancer.  She has completed 4 cycles of carbo etoposide Tecentriq chemotherapy and is currently on maintenance Tecentriq.  Repeat liver biopsy was also performed and was consistent with small cell lung cancer   Disease progression after 23 cycles of maintenance Tecentriq in the left supraclavicular lymph node region.  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  Plan to switch patient to second line lurbinectedin  Interval history- continues to tolerate treatment well. Denies any complaints. Eating well. Denies complaints.   ECOG PS- 1 Pain scale- 0 Opioid associated constipation- no  Review of systems- Review of Systems  Constitutional:  Negative for chills, fever, malaise/fatigue and weight loss.  HENT:  Negative for congestion, ear discharge and nosebleeds.   Eyes:  Negative for blurred vision.  Respiratory:  Negative for cough, hemoptysis, sputum production, shortness of breath and wheezing.   Cardiovascular:  Negative for chest pain, palpitations, orthopnea and claudication.  Gastrointestinal:  Negative for abdominal pain, blood in stool, constipation, diarrhea, heartburn, melena, nausea and vomiting.  Genitourinary:  Negative for dysuria, flank pain, frequency, hematuria and urgency.  Musculoskeletal:  Negative for back pain, joint pain and myalgias.  Skin:  Negative for rash.  Neurological:  Negative for dizziness, tingling, focal weakness, seizures, weakness and headaches.  Endo/Heme/Allergies:  Does not bruise/bleed easily.  Psychiatric/Behavioral:  Negative for depression and suicidal ideas.  The patient does not have insomnia.      No Known Allergies   Past Medical History:  Diagnosis Date   Cancer (HCC)    Cataract    Diabetes mellitus without complication (HCC)    Hyperlipidemia    Hypertension    Hypothyroidism    doctor's keeping an eye on thyroid    Lung cancer [facility])      Past Surgical History:  Procedure Laterality Date   ABDOMINAL HYSTERECTOMY     IR CV LINE INJECTION  09/28/2021   MYRINGOTOMY WITH TUBE PLACEMENT Right 12/29/2022   Procedure: MYRINGOTOMY WITH TUBE PLACEMENT;  Surgeon: Vernie Murders, MD;  Location: [facility];  Service: ENT;  Laterality: Right;  Diabetic   PORTA CATH INSERTION N/A 11/30/2020   Procedure: PORTA CATH INSERTION;  Surgeon: Annice Needy, MD;  Location: [facility];  Service: Cardiovascular;  Laterality: N/A;   VIDEO BRONCHOSCOPY WITH ENDOBRONCHIAL NAVIGATION N/A 10/21/2020   Procedure: VIDEO BRONCHOSCOPY WITH ENDOBRONCHIAL NAVIGATION;  Surgeon: Vida Rigger, MD;  Location: [facility] OR[facility];  Service: Thoracic;  Laterality: N/A;   VIDEO BRONCHOSCOPY WITH ENDOBRONCHIAL ULTRASOUND N/A 10/21/2020   Procedure: VIDEO BRONCHOSCOPY WITH ENDOBRONCHIAL ULTRASOUND;  Surgeon: Vida Rigger, MD;  Location: [facility] OR[facility];  Service: Thoracic;  Laterality: N/A;    Social History   Socioeconomic History   Marital status: Single    Spouse name: Not on file   Number of children: Not on file   Years of education: Not on file   Highest education level: Not on file  Occupational History   Not on file  Tobacco Use   Smoking status: Never   Smokeless tobacco: Never  Vaping Use   Vaping Use: Never used  Substance and Sexual Activity   Alcohol use: No   Drug use: No   Sexual activity: Not Currently  Other Topics Concern   Not on file  Social History Narrative   Not on file   Social Determinants of Health   Financial Resource Strain: Not on file  Food Insecurity: Food Insecurity Present (07/08/2021)   Hunger Vital Sign    Worried  About Running Out of Food in the Last Year: Sometimes true    Ran Out of Food in the Last Year: Sometimes true  Transportation Needs: Unmet Transportation Needs (03/28/2023)   PRAPARE - Administrator, Civil Service (Medical): Yes    Lack of Transportation (Non-Medical): Yes  Physical Activity: Not on file  Stress: Not on file  Social Connections: Not on file  Intimate Partner Violence: Not on file    Family History  Problem Relation Age of Onset   Cancer Mother        breast   Hypertension Mother    Breast cancer Mother 38   Heart disease Father    Hyperlipidemia Brother    Heart disease Brother      Current Outpatient Medications:    dexamethasone (DECADRON) 4 MG tablet, Take 1 tablet (4 mg total) by mouth 2 (two) times daily with a meal., Disp: 60 tablet, Rfl: 1   ELIQUIS 5 MG TABS tablet, TAKE 1 TABLET(5 MG) BY MOUTH TWICE DAILY, Disp: 60 tablet, Rfl: 1   fluticasone (FLONASE) 50 MCG/ACT nasal spray, Place 2 sprays into both nostrils daily., Disp: 16 g, Rfl: 5   furosemide (LASIX)  20 MG tablet, Take 1 tablet (20 mg total) by mouth daily., Disp: 60 tablet, Rfl: 0   lidocaine-prilocaine (EMLA) cream, Apply 1 Application topically as needed. Apply small amount to port site at least 1 hour prior to it being accessed, cover with plastic wrap, Disp: 30 g, Rfl: 1   losartan (COZAAR) 25 MG tablet, Take 1 tablet by mouth daily., Disp: , Rfl:    metFORMIN (GLUCOPHAGE-XR) 500 MG 24 hr tablet, Take by mouth., Disp: , Rfl:    potassium chloride SA (KLOR-CON M) 20 MEQ tablet, Take 1 tablet by mouth daily., Disp: , Rfl:  No current facility-administered medications for this visit.  Facility-Administered Medications Ordered in Other Visits:    sodium chloride flush (NS) 0.9 % injection 10 mL, 10 mL, Intravenous, PRN, Creig Hines, MD, 10 mL at 12/15/20 0850   sodium chloride flush (NS) 0.9 % injection 10 mL, 10 mL, Intravenous, PRN, Creig Hines, MD, 10 mL at 03/07/23  0923  Physical exam:  Vitals:   05/09/23 0952 05/09/23 0955  BP: (!) 145/107 (!) 147/75  Pulse: 64 64  Resp: 18   Temp: (!) 96.9 F (36.1 C)   TempSrc: Tympanic   SpO2: 98%   Weight: 163 lb 9.6 oz (74.2 kg)   Height: 5\' 6"  (1.676 m)    Physical Exam Vitals reviewed.  Constitutional:      Appearance: She is not ill-appearing.  Cardiovascular:     Rate and Rhythm: Normal rate and regular rhythm.  Pulmonary:     Effort: Pulmonary effort is normal.  Abdominal:     General: There is no distension.     Palpations: Abdomen is soft.     Tenderness: There is no abdominal tenderness.  Skin:    General: Skin is warm and dry.  Neurological:     Mental Status: She is alert and oriented to person, place, and time.  Psychiatric:        Mood and Affect: Mood normal.        Behavior: Behavior normal.         Latest Ref Rng & Units 05/09/2023    9:32 AM  CMP  Glucose 70 - 99 mg/dL 829   BUN 8 - 23 mg/dL 14   Creatinine 5.62 - 1.00 mg/dL 1.30   Sodium 865 - 784 mmol/L 141   Potassium 3.5 - 5.1 mmol/L 3.4   Chloride 98 - 111 mmol/L 108   CO2 22 - 32 mmol/L 24   Calcium 8.9 - 10.3 mg/dL 9.5   Total Protein 6.5 - 8.1 g/dL 7.1   Total Bilirubin 0.3 - 1.2 mg/dL 0.7   Alkaline Phos 38 - 126 U/L 66   AST 15 - 41 U/L 13   ALT 0 - 44 U/L 9       Latest Ref Rng & Units 05/09/2023    9:32 AM  CBC  WBC 4.0 - 10.5 K/uL 2.5   Hemoglobin 12.0 - 15.0 g/dL 69.6   Hematocrit 29.5 - 46.0 % 34.8   Platelets 150 - 400 K/uL 297      Assessment and plan- Patient is a 72 y.o. female   Extensive stage small cell lung cancer - with liver and supraclavicular lymph node metastases . Here for treatment assessment prior to cycle 14 of lurbinectedin. ANC decreased but > 1000. Ok to proceed with cycle 14 of lurbinectedin today. With udenyca support on D3. She has restaging imaging scheduled for 06/13/23.  Anemia- Hmg 11.5. Improved.  Monitor.  History of renal vein thrombosis- continue eliquis.  Refilled.  Hypokalemia- K 3.4. continue oral potassium. Refilled.   Disposition:  Lurbinectedin today. D3, Udenyca 3 weeks- port/lab, Dr Smith Robert, cycle 15 of lurbinectedin, D3 udenyca- la    Visit Diagnosis 1. Encounter for antineoplastic chemotherapy   2. Small cell carcinoma of lung metastatic to liver (HCC)   3. Hypokalemia    Consuello Masse, DNP, AGNP-C, [facility] [phone] (clinic) 05/09/2023

## 2023-05-09 NOTE — Progress Notes (Signed)
No concerns for the provider. 

## 2023-05-11 ENCOUNTER — Inpatient Hospital Stay: Payer: Medicare HMO

## 2023-05-11 DIAGNOSIS — C787 Secondary malignant neoplasm of liver and intrahepatic bile duct: Secondary | ICD-10-CM

## 2023-05-11 DIAGNOSIS — T451X5A Adverse effect of antineoplastic and immunosuppressive drugs, initial encounter: Secondary | ICD-10-CM | POA: Diagnosis not present

## 2023-05-11 DIAGNOSIS — C770 Secondary and unspecified malignant neoplasm of lymph nodes of head, face and neck: Secondary | ICD-10-CM | POA: Diagnosis not present

## 2023-05-11 DIAGNOSIS — C7931 Secondary malignant neoplasm of brain: Secondary | ICD-10-CM | POA: Diagnosis not present

## 2023-05-11 DIAGNOSIS — D701 Agranulocytosis secondary to cancer chemotherapy: Secondary | ICD-10-CM | POA: Diagnosis not present

## 2023-05-11 DIAGNOSIS — Z87891 Personal history of nicotine dependence: Secondary | ICD-10-CM | POA: Diagnosis not present

## 2023-05-11 DIAGNOSIS — C3412 Malignant neoplasm of upper lobe, left bronchus or lung: Secondary | ICD-10-CM | POA: Diagnosis not present

## 2023-05-11 DIAGNOSIS — Z7722 Contact with and (suspected) exposure to environmental tobacco smoke (acute) (chronic): Secondary | ICD-10-CM | POA: Diagnosis not present

## 2023-05-11 DIAGNOSIS — Z5111 Encounter for antineoplastic chemotherapy: Secondary | ICD-10-CM | POA: Diagnosis not present

## 2023-05-11 MED ORDER — PEGFILGRASTIM-CBQV 6 MG/0.6ML ~~LOC~~ SOSY
6.0000 mg | PREFILLED_SYRINGE | Freq: Once | SUBCUTANEOUS | Status: AC
  Administered 2023-05-11: 6 mg via SUBCUTANEOUS
  Filled 2023-05-11: qty 0.6

## 2023-05-16 ENCOUNTER — Telehealth: Payer: Self-pay | Admitting: *Deleted

## 2023-05-16 NOTE — Telephone Encounter (Signed)
FMLA completed and faxed to [facility] Confirmation of receipt obtained Form copied for chart.

## 2023-05-16 NOTE — Telephone Encounter (Signed)
Received FMLA for patient daughter for continuation  Form completed and set for physician signature

## 2023-05-29 MED FILL — Dexamethasone Sodium Phosphate Inj 100 MG/10ML: INTRAMUSCULAR | Qty: 1 | Status: AC

## 2023-05-30 ENCOUNTER — Inpatient Hospital Stay: Payer: Medicare HMO | Attending: Oncology

## 2023-05-30 ENCOUNTER — Inpatient Hospital Stay (HOSPITAL_BASED_OUTPATIENT_CLINIC_OR_DEPARTMENT_OTHER): Payer: Medicare HMO | Admitting: Oncology

## 2023-05-30 ENCOUNTER — Inpatient Hospital Stay: Payer: Medicare HMO

## 2023-05-30 ENCOUNTER — Encounter: Payer: Self-pay | Admitting: Oncology

## 2023-05-30 VITALS — BP 161/79 | HR 69 | Temp 96.5°F | Resp 18 | Ht 66.0 in | Wt 166.9 lb

## 2023-05-30 VITALS — BP 156/70 | HR 55 | Resp 16

## 2023-05-30 DIAGNOSIS — Z8249 Family history of ischemic heart disease and other diseases of the circulatory system: Secondary | ICD-10-CM | POA: Insufficient documentation

## 2023-05-30 DIAGNOSIS — Z5111 Encounter for antineoplastic chemotherapy: Secondary | ICD-10-CM

## 2023-05-30 DIAGNOSIS — C3412 Malignant neoplasm of upper lobe, left bronchus or lung: Secondary | ICD-10-CM | POA: Diagnosis not present

## 2023-05-30 DIAGNOSIS — R160 Hepatomegaly, not elsewhere classified: Secondary | ICD-10-CM | POA: Diagnosis not present

## 2023-05-30 DIAGNOSIS — C7931 Secondary malignant neoplasm of brain: Secondary | ICD-10-CM | POA: Insufficient documentation

## 2023-05-30 DIAGNOSIS — Z87891 Personal history of nicotine dependence: Secondary | ICD-10-CM | POA: Diagnosis not present

## 2023-05-30 DIAGNOSIS — Z86718 Personal history of other venous thrombosis and embolism: Secondary | ICD-10-CM | POA: Diagnosis not present

## 2023-05-30 DIAGNOSIS — E876 Hypokalemia: Secondary | ICD-10-CM | POA: Insufficient documentation

## 2023-05-30 DIAGNOSIS — C787 Secondary malignant neoplasm of liver and intrahepatic bile duct: Secondary | ICD-10-CM | POA: Diagnosis not present

## 2023-05-30 DIAGNOSIS — C3490 Malignant neoplasm of unspecified part of unspecified bronchus or lung: Secondary | ICD-10-CM

## 2023-05-30 DIAGNOSIS — E049 Nontoxic goiter, unspecified: Secondary | ICD-10-CM | POA: Diagnosis not present

## 2023-05-30 DIAGNOSIS — T451X5A Adverse effect of antineoplastic and immunosuppressive drugs, initial encounter: Secondary | ICD-10-CM

## 2023-05-30 DIAGNOSIS — I10 Essential (primary) hypertension: Secondary | ICD-10-CM | POA: Insufficient documentation

## 2023-05-30 DIAGNOSIS — Z7901 Long term (current) use of anticoagulants: Secondary | ICD-10-CM | POA: Diagnosis not present

## 2023-05-30 DIAGNOSIS — Z8349 Family history of other endocrine, nutritional and metabolic diseases: Secondary | ICD-10-CM | POA: Insufficient documentation

## 2023-05-30 DIAGNOSIS — Z79899 Other long term (current) drug therapy: Secondary | ICD-10-CM | POA: Diagnosis not present

## 2023-05-30 DIAGNOSIS — D6481 Anemia due to antineoplastic chemotherapy: Secondary | ICD-10-CM | POA: Diagnosis not present

## 2023-05-30 DIAGNOSIS — D701 Agranulocytosis secondary to cancer chemotherapy: Secondary | ICD-10-CM | POA: Diagnosis not present

## 2023-05-30 DIAGNOSIS — Z803 Family history of malignant neoplasm of breast: Secondary | ICD-10-CM | POA: Diagnosis not present

## 2023-05-30 DIAGNOSIS — Z90710 Acquired absence of both cervix and uterus: Secondary | ICD-10-CM | POA: Diagnosis not present

## 2023-05-30 LAB — CBC WITH DIFFERENTIAL/PLATELET
Abs Immature Granulocytes: 0 10*3/uL (ref 0.00–0.07)
Basophils Absolute: 0 10*3/uL (ref 0.0–0.1)
Basophils Relative: 0 %
Eosinophils Absolute: 0 10*3/uL (ref 0.0–0.5)
Eosinophils Relative: 1 %
HCT: 32.9 % — ABNORMAL LOW (ref 36.0–46.0)
Hemoglobin: 10.9 g/dL — ABNORMAL LOW (ref 12.0–15.0)
Immature Granulocytes: 0 %
Lymphocytes Relative: 25 %
Lymphs Abs: 0.6 10*3/uL — ABNORMAL LOW (ref 0.7–4.0)
MCH: 31 pg (ref 26.0–34.0)
MCHC: 33.1 g/dL (ref 30.0–36.0)
MCV: 93.5 fL (ref 80.0–100.0)
Monocytes Absolute: 0.5 10*3/uL (ref 0.1–1.0)
Monocytes Relative: 20 %
Neutro Abs: 1.2 10*3/uL — ABNORMAL LOW (ref 1.7–7.7)
Neutrophils Relative %: 54 %
Platelets: 272 10*3/uL (ref 150–400)
RBC: 3.52 MIL/uL — ABNORMAL LOW (ref 3.87–5.11)
RDW: 15 % (ref 11.5–15.5)
WBC: 2.3 10*3/uL — ABNORMAL LOW (ref 4.0–10.5)
nRBC: 0 % (ref 0.0–0.2)

## 2023-05-30 LAB — COMPREHENSIVE METABOLIC PANEL
ALT: 8 U/L (ref 0–44)
AST: 13 U/L — ABNORMAL LOW (ref 15–41)
Albumin: 3.9 g/dL (ref 3.5–5.0)
Alkaline Phosphatase: 64 U/L (ref 38–126)
Anion gap: 6 (ref 5–15)
BUN: 12 mg/dL (ref 8–23)
CO2: 23 mmol/L (ref 22–32)
Calcium: 9.1 mg/dL (ref 8.9–10.3)
Chloride: 109 mmol/L (ref 98–111)
Creatinine, Ser: 0.52 mg/dL (ref 0.44–1.00)
GFR, Estimated: >60 mL/min (ref >60)
Glucose, Bld: 94 mg/dL (ref 70–99)
Potassium: 3.2 mmol/L — ABNORMAL LOW (ref 3.5–5.1)
Sodium: 138 mmol/L (ref 135–145)
Total Bilirubin: 0.7 mg/dL (ref 0.3–1.2)
Total Protein: 6.8 g/dL (ref 6.5–8.1)

## 2023-05-30 MED ORDER — HEPARIN SOD (PORK) LOCK FLUSH 100 UNIT/ML IV SOLN
500.0000 [IU] | Freq: Once | INTRAVENOUS | Status: AC | PRN
  Administered 2023-05-30: 500 [IU]
  Filled 2023-05-30: qty 5

## 2023-05-30 MED ORDER — PALONOSETRON HCL INJECTION 0.25 MG/5ML
0.2500 mg | Freq: Once | INTRAVENOUS | Status: AC
  Administered 2023-05-30: 0.25 mg via INTRAVENOUS
  Filled 2023-05-30: qty 5

## 2023-05-30 MED ORDER — POTASSIUM CHLORIDE CRYS ER 20 MEQ PO TBCR: 20.0000 meq | EXTENDED_RELEASE_TABLET | Freq: Every day | ORAL | 1 refills | Status: DC

## 2023-05-30 MED ORDER — SODIUM CHLORIDE 0.9% FLUSH
10.0000 mL | Freq: Once | INTRAVENOUS | Status: AC
  Administered 2023-05-30: 10 mL via INTRAVENOUS
  Filled 2023-05-30: qty 10

## 2023-05-30 MED ORDER — SODIUM CHLORIDE 0.9% FLUSH
10.0000 mL | INTRAVENOUS | Status: DC | PRN
  Administered 2023-05-30: 10 mL
  Filled 2023-05-30: qty 10

## 2023-05-30 MED ORDER — SODIUM CHLORIDE 0.9 % IV SOLN
10.0000 mg | Freq: Once | INTRAVENOUS | Status: AC
  Administered 2023-05-30: 10 mg via INTRAVENOUS
  Filled 2023-05-30: qty 10

## 2023-05-30 MED ORDER — SODIUM CHLORIDE 0.9 % IV SOLN
2.5600 mg/m2 | Freq: Once | INTRAVENOUS | Status: AC
  Administered 2023-05-30: 4.6 mg via INTRAVENOUS
  Filled 2023-05-30: qty 9.2

## 2023-05-30 MED ORDER — SODIUM CHLORIDE 0.9 % IV SOLN
Freq: Once | INTRAVENOUS | Status: AC
  Filled 2023-05-30: qty 250

## 2023-05-30 NOTE — Patient Instructions (Signed)
 [facility]  Discharge Instructions: Thank you for choosing [facility] to provide your oncology and hematology care.  If you have a lab appointment with the [facility], please go directly to the [facility] and check in at the registration area.  Wear comfortable clothing and clothing appropriate for easy access to any Portacath or PICC line.   We strive to give you quality time with your provider. You may need to reschedule your appointment if you arrive late (15 or more minutes).  Arriving late affects you and other patients whose appointments are after yours.  Also, if you miss three or more appointments without notifying the office, you may be dismissed from the clinic at the provider's discretion.      For prescription refill requests, have your pharmacy contact our office and allow 72 hours for refills to be completed.    Today you received the following chemotherapy and/or immunotherapy agents- lurbinedectin      To help prevent nausea and vomiting after your treatment, we encourage you to take your nausea medication as directed.  BELOW ARE SYMPTOMS THAT SHOULD BE REPORTED IMMEDIATELY: *FEVER GREATER THAN 100.4 F (38 C) OR HIGHER *CHILLS OR SWEATING *NAUSEA AND VOMITING THAT IS NOT CONTROLLED WITH YOUR NAUSEA MEDICATION *UNUSUAL SHORTNESS OF BREATH *UNUSUAL BRUISING OR BLEEDING *URINARY PROBLEMS (pain or burning when urinating, or frequent urination) *BOWEL PROBLEMS (unusual diarrhea, constipation, pain near the anus) TENDERNESS IN MOUTH AND THROAT WITH OR WITHOUT PRESENCE OF ULCERS (sore throat, sores in mouth, or a toothache) UNUSUAL RASH, SWELLING OR PAIN  UNUSUAL VAGINAL DISCHARGE OR ITCHING   Items with * indicate a potential emergency and should be followed up as soon as possible or go to the Emergency Department if any problems should occur.  Please show the CHEMOTHERAPY ALERT CARD or IMMUNOTHERAPY ALERT CARD at  check-in to the Emergency Department and triage nurse.  Should you have questions after your visit or need to cancel or reschedule your appointment, please contact [facility]  [phone] and follow the prompts.  Office hours are 8:00 a.m. to 4:30 p.m. Monday - Friday. Please note that voicemails left after 4:00 p.m. may not be returned until the following business day.  We are closed weekends and major holidays. You have access to a nurse at all times for urgent questions. Please call the main number to the clinic [phone] and follow the prompts.  For any non-urgent questions, you may also contact your provider using MyChart. We now offer e-Visits for anyone 23 and older to request care online for non-urgent symptoms. For details visit [email or web address].   Also download the MyChart app! Go to the app store, search "MyChart", open the app, select , and log in with your MyChart username and password.

## 2023-05-30 NOTE — Progress Notes (Signed)
Hematology/Oncology Consult note [facility]  Telephone:([phone] [phone]  Patient Care Team: Mick Sell, MD as PCP - General (Infectious Diseases) Glory Buff, RN as Oncology Nurse Navigator Creig Hines, MD as Consulting Physician (Hematology and Oncology)   Name of the patient: Doris Johnson  [id number]  [birth year]   Date of visit: 05/30/23  Diagnosis- extensive stage small cell lung cancer with liver lymph node and brain metastases   Chief complaint/ Reason for visit-on treatment assessment prior to cycle 13 of lurbinectedin  Heme/Onc history: Patient is a 72 year old female with a remote history of smoking in her teenage years and exposure to passive smoking.  She finally had a CT chest with contrast done in October 2021 which showed a large mass in the left side of the mediastinum measuring 6.7 x 6.2 x 6.1 cm causing severe compression of the left main pulmonary artery and around the aortic arch in the left subclavian artery.  Enlarged thyroid gland.  Left upper lobe nodule 1 x 0.7 cm.  She then underwent bronchoscopy with Dr.Aleskerov left upper lobe biopsy was nondiagnostic.  However station 4, 7 and 10 L lymph nodes were consistent with metastatic small cell carcinoma.   PET CT scan showed enlarged level 3 cervical lymph node 2.2 cm that was hypermetabolic with an SUV of 9.7.  Large central 7.3 cm AP window mass.  5.7 cm left liver mass.  MRI brain with and without contrast showed 2 small foci of enhancement in the right cerebellar hemisphere and right temporal lobe without mass-effect or edema as well concerning for brain metastases   Patient had 2 cycles of carbo etoposide chemotherapy along with Tecentriq and subsequent scan showed no significant improvement in the primary lung mass or liver mass.  She received radiation treatment to her primary lung mass and also seen by [facility] for second opinion.  Her pathology was reviewed at  [facility] and reported as possible neuroendocrine neoplasm But stated that small cell carcinoma cannot be excluded but not definitely diagnostic for it.  However [facility] pathology feels that this is consistent with small cell lung cancer.  She has completed 4 cycles of carbo etoposide Tecentriq chemotherapy and is currently on maintenance Tecentriq.  Repeat liver biopsy was also performed and was consistent with small cell lung cancer   Disease progression after 23 cycles of maintenance Tecentriq in the left supraclavicular lymph node region.  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  Plan to switch patient to second line lurbinectedin since November 2023  Interval history-tolerate treatments well so far.  Denies any side effects or concerns today.  ECOG PS- 1 Pain scale- 0  Review of systems- Review of Systems  Constitutional:  Negative for chills, fever, malaise/fatigue and weight loss.  HENT:  Negative for congestion, ear discharge and nosebleeds.   Eyes:  Negative for blurred vision.  Respiratory:  Negative for cough, hemoptysis, sputum production, shortness of breath and wheezing.   Cardiovascular:  Negative for chest pain, palpitations, orthopnea and claudication.  Gastrointestinal:  Negative for abdominal pain, blood in stool, constipation, diarrhea, heartburn, melena, nausea and vomiting.  Genitourinary:  Negative for dysuria, flank pain, frequency, hematuria and urgency.  Musculoskeletal:  Negative for back pain, joint pain and myalgias.  Skin:  Negative for rash.  Neurological:  Negative for dizziness, tingling, focal weakness, seizures, weakness and headaches.  Endo/Heme/Allergies:  Does not bruise/bleed easily.  Psychiatric/Behavioral:  Negative for depression and suicidal ideas. The patient  does not have insomnia.       No Known Allergies   Past Medical History:  Diagnosis Date   Cancer (HCC)    Cataract    Diabetes mellitus without  complication (HCC)    Hyperlipidemia    Hypertension    Hypothyroidism    doctor's keeping an eye on thyroid    Lung cancer [facility])      Past Surgical History:  Procedure Laterality Date   ABDOMINAL HYSTERECTOMY     IR CV LINE INJECTION  09/28/2021   MYRINGOTOMY WITH TUBE PLACEMENT Right 12/29/2022   Procedure: MYRINGOTOMY WITH TUBE PLACEMENT;  Surgeon: Vernie Murders, MD;  Location: [facility];  Service: ENT;  Laterality: Right;  Diabetic   PORTA CATH INSERTION N/A 11/30/2020   Procedure: PORTA CATH INSERTION;  Surgeon: Annice Needy, MD;  Location: [facility] INVAS[facility] CV LAB;  Service: Cardiovascular;  Laterality: N/A;   VIDEO BRONCHOSCOPY WITH ENDOBRONCHIAL NAVIGATION N/A 10/21/2020   Procedure: VIDEO BRONCHOSCOPY WITH ENDOBRONCHIAL NAVIGATION;  Surgeon: Vida Rigger, MD;  Location: [facility] ORS;  Service: Thoracic;  Laterality: N/A;   VIDEO BRONCHOSCOPY WITH ENDOBRONCHIAL ULTRASOUND N/A 10/21/2020   Procedure: VIDEO BRONCHOSCOPY WITH ENDOBRONCHIAL ULTRASOUND;  Surgeon: Vida Rigger, MD;  Location: [facility] ORS;  Service: Thoracic;  Laterality: N/A;    Social History   Socioeconomic History   Marital status: Single    Spouse name: Not on file   Number of children: Not on file   Years of education: Not on file   Highest education level: Not on file  Occupational History   Not on file  Tobacco Use   Smoking status: Never   Smokeless tobacco: Never  Vaping Use   Vaping status: Never Used  Substance and Sexual Activity   Alcohol use: No   Drug use: No   Sexual activity: Not Currently  Other Topics Concern   Not on file  Social History Narrative   Not on file   Social Determinants of Health   Financial Resource Strain: Low Risk  (03/22/2023)   Received [facility], [facility]   Overall Financial Resource Strain (CARDIA)    Difficulty of Paying Living Expenses: Not hard at all  Food Insecurity: No Food Insecurity (03/22/2023)    Received from [facility], [facility]   Hunger Vital Sign    Worried About Running Out of Food in the Last Year: Never true    Ran Out of Food in the Last Year: Never true  Transportation Needs: Unmet Transportation Needs (03/28/2023)   PRAPARE - Administrator, Civil Service (Medical): Yes    Lack of Transportation (Non-Medical): Yes  Physical Activity: Not on file  Stress: Not on file  Social Connections: Not on file  Intimate Partner Violence: Not on file    Family History  Problem Relation Age of Onset   Cancer Mother        breast   Hypertension Mother    Breast cancer Mother 54   Heart disease Father    Hyperlipidemia Brother    Heart disease Brother      Current Outpatient Medications:    apixaban (ELIQUIS) 5 MG TABS tablet, Take 1 tablet (5 mg total) by mouth 2 (two) times daily., Disp: 60 tablet, Rfl: 1   fluticasone (FLONASE) 50 MCG/ACT nasal spray, Place 2 sprays into both nostrils daily., Disp: 16 g, Rfl: 5   lidocaine-prilocaine (EMLA) cream, Apply 1 Application topically as needed.  Apply small amount to port site at least 1 hour prior to it being accessed, cover with plastic wrap, Disp: 30 g, Rfl: 1   losartan (COZAAR) 25 MG tablet, Take 1 tablet by mouth daily., Disp: , Rfl:    metFORMIN (GLUCOPHAGE-XR) 500 MG 24 hr tablet, Take by mouth., Disp: , Rfl:    potassium chloride SA (KLOR-CON M) 20 MEQ tablet, Take 1 tablet (20 mEq total) by mouth daily., Disp: 30 tablet, Rfl: 1 No current facility-administered medications for this visit.  Facility-Administered Medications Ordered in Other Visits:    sodium chloride flush (NS) 0.9 % injection 10 mL, 10 mL, Intravenous, PRN, Creig Hines, MD, 10 mL at 12/15/20 0850   sodium chloride flush (NS) 0.9 % injection 10 mL, 10 mL, Intravenous, PRN, Creig Hines, MD, 10 mL at 03/07/23 0923   sodium chloride flush (NS) 0.9 % injection 10 mL, 10 mL, Intracatheter, PRN, Creig Hines, MD, 10 mL at 05/30/23 1235  Physical exam:  Vitals:   05/30/23 0926 05/30/23 0931  BP: (!) 169/73 (!) 161/79  Pulse: 63 69  Resp: 18   Temp: (!) 96.5 F (35.8 C)   TempSrc: Tympanic   SpO2: 100%   Weight: 166 lb 14.4 oz (75.7 kg)   Height: 5\' 6"  (1.676 m)    Physical Exam Cardiovascular:     Rate and Rhythm: Normal rate and regular rhythm.     Heart sounds: Normal heart sounds.  Pulmonary:     Effort: Pulmonary effort is normal.     Breath sounds: Normal breath sounds.  Abdominal:     General: Bowel sounds are normal.     Palpations: Abdomen is soft.  Skin:    General: Skin is warm and dry.  Neurological:     Mental Status: She is alert and oriented to person, place, and time.         Latest Ref Rng & Units 05/30/2023    9:05 AM  CMP  Glucose 70 - 99 mg/dL 94   BUN 8 - 23 mg/dL 12   Creatinine 5.62 - 1.00 mg/dL 1.30   Sodium 865 - 784 mmol/L 138   Potassium 3.5 - 5.1 mmol/L 3.2   Chloride 98 - 111 mmol/L 109   CO2 22 - 32 mmol/L 23   Calcium 8.9 - 10.3 mg/dL 9.1   Total Protein 6.5 - 8.1 g/dL 6.8   Total Bilirubin 0.3 - 1.2 mg/dL 0.7   Alkaline Phos 38 - 126 U/L 64   AST 15 - 41 U/L 13   ALT 0 - 44 U/L 8       Latest Ref Rng & Units 05/30/2023    9:05 AM  CBC  WBC 4.0 - 10.5 K/uL 2.3   Hemoglobin 12.0 - 15.0 g/dL 69.6   Hematocrit 29.5 - 46.0 % 32.9   Platelets 150 - 400 K/uL 272     Assessment and plan- Patient is a 72 y.o. female  with extensive stage small cell lung cancer with liver and supraclavicular lymph node metastases .  She is here for on treatment assessment prior to cycle 14 of lurbinectedin  Counts okay to proceed with cycle 14 of lurbinectedin today and I will see her back in 3 weeks for cycle 15.  She will need CT chest abdomen pelvis with contrast prior.  White cell count is 2.3 today but ANC remains more than 1 and she receives rods factor support with each cycle.  Chemo-induced anemia: Overall  stable continue to  monitor  Hypokalemia: Oral potassium prescription renewed  History of renal vein thrombosis: Continue Eliquis   Visit Diagnosis 1. Small cell carcinoma of lung metastatic to liver (HCC)   2. Encounter for antineoplastic chemotherapy   3. Hypokalemia   4. Chemotherapy induced neutropenia (HCC)      Dr. Owens Shark, MD, MPH [facility] at [facility] [phone] 05/30/2023 12:45 PM

## 2023-06-01 ENCOUNTER — Inpatient Hospital Stay: Payer: Medicare HMO

## 2023-06-01 DIAGNOSIS — R160 Hepatomegaly, not elsewhere classified: Secondary | ICD-10-CM | POA: Diagnosis not present

## 2023-06-01 DIAGNOSIS — I10 Essential (primary) hypertension: Secondary | ICD-10-CM | POA: Diagnosis not present

## 2023-06-01 DIAGNOSIS — T451X5A Adverse effect of antineoplastic and immunosuppressive drugs, initial encounter: Secondary | ICD-10-CM | POA: Diagnosis not present

## 2023-06-01 DIAGNOSIS — C787 Secondary malignant neoplasm of liver and intrahepatic bile duct: Secondary | ICD-10-CM | POA: Diagnosis not present

## 2023-06-01 DIAGNOSIS — D6481 Anemia due to antineoplastic chemotherapy: Secondary | ICD-10-CM | POA: Diagnosis not present

## 2023-06-01 DIAGNOSIS — C3490 Malignant neoplasm of unspecified part of unspecified bronchus or lung: Secondary | ICD-10-CM

## 2023-06-01 DIAGNOSIS — E049 Nontoxic goiter, unspecified: Secondary | ICD-10-CM | POA: Diagnosis not present

## 2023-06-01 DIAGNOSIS — C3412 Malignant neoplasm of upper lobe, left bronchus or lung: Secondary | ICD-10-CM | POA: Diagnosis not present

## 2023-06-01 DIAGNOSIS — C7931 Secondary malignant neoplasm of brain: Secondary | ICD-10-CM | POA: Diagnosis not present

## 2023-06-01 DIAGNOSIS — Z5111 Encounter for antineoplastic chemotherapy: Secondary | ICD-10-CM | POA: Diagnosis not present

## 2023-06-01 MED ORDER — PEGFILGRASTIM-CBQV 6 MG/0.6ML ~~LOC~~ SOSY
6.0000 mg | PREFILLED_SYRINGE | Freq: Once | SUBCUTANEOUS | Status: AC
  Administered 2023-06-01: 6 mg via SUBCUTANEOUS
  Filled 2023-06-01: qty 0.6

## 2023-06-13 ENCOUNTER — Inpatient Hospital Stay: Payer: Medicare HMO

## 2023-06-13 ENCOUNTER — Ambulatory Visit
Admission: RE | Admit: 2023-06-13 | Discharge: 2023-06-13 | Disposition: A | Discharge status: Home / Self Care | Payer: Medicare HMO | Source: Ambulatory Visit | Attending: Oncology | Admitting: Oncology

## 2023-06-13 DIAGNOSIS — C3490 Malignant neoplasm of unspecified part of unspecified bronchus or lung: Secondary | ICD-10-CM | POA: Diagnosis not present

## 2023-06-13 DIAGNOSIS — C787 Secondary malignant neoplasm of liver and intrahepatic bile duct: Secondary | ICD-10-CM | POA: Insufficient documentation

## 2023-06-13 MED ORDER — IOHEXOL 300 MG/ML  SOLN
100.0000 mL | Freq: Once | INTRAMUSCULAR | Status: AC | PRN
  Administered 2023-06-13: 100 mL via INTRAVENOUS

## 2023-06-14 ENCOUNTER — Encounter: Payer: Self-pay | Admitting: Oncology

## 2023-06-15 ENCOUNTER — Encounter: Payer: Self-pay | Admitting: Oncology

## 2023-06-16 ENCOUNTER — Encounter: Payer: Self-pay | Admitting: Oncology

## 2023-06-19 MED FILL — Dexamethasone Sodium Phosphate Inj 100 MG/10ML: INTRAMUSCULAR | Qty: 1 | Status: AC

## 2023-06-20 ENCOUNTER — Inpatient Hospital Stay: Payer: 59

## 2023-06-20 ENCOUNTER — Ambulatory Visit: Payer: Medicare HMO | Admitting: Oncology

## 2023-06-20 ENCOUNTER — Inpatient Hospital Stay (HOSPITAL_BASED_OUTPATIENT_CLINIC_OR_DEPARTMENT_OTHER): Payer: 59 | Admitting: Oncology

## 2023-06-20 ENCOUNTER — Other Ambulatory Visit: Payer: Self-pay | Admitting: *Deleted

## 2023-06-20 ENCOUNTER — Other Ambulatory Visit: Payer: Medicare HMO

## 2023-06-20 ENCOUNTER — Inpatient Hospital Stay: Payer: 59 | Attending: Oncology

## 2023-06-20 ENCOUNTER — Encounter: Payer: Self-pay | Admitting: Oncology

## 2023-06-20 ENCOUNTER — Other Ambulatory Visit: Payer: Self-pay | Admitting: Oncology

## 2023-06-20 ENCOUNTER — Ambulatory Visit: Payer: Medicare HMO

## 2023-06-20 VITALS — BP 132/63 | HR 62 | Temp 97.6°F | Resp 19

## 2023-06-20 VITALS — BP 122/81 | HR 74 | Temp 96.5°F | Resp 18 | Ht 66.0 in | Wt 163.8 lb

## 2023-06-20 DIAGNOSIS — Z9221 Personal history of antineoplastic chemotherapy: Secondary | ICD-10-CM | POA: Insufficient documentation

## 2023-06-20 DIAGNOSIS — T451X5A Adverse effect of antineoplastic and immunosuppressive drugs, initial encounter: Secondary | ICD-10-CM | POA: Diagnosis not present

## 2023-06-20 DIAGNOSIS — Z5111 Encounter for antineoplastic chemotherapy: Secondary | ICD-10-CM | POA: Insufficient documentation

## 2023-06-20 DIAGNOSIS — D701 Agranulocytosis secondary to cancer chemotherapy: Secondary | ICD-10-CM | POA: Insufficient documentation

## 2023-06-20 DIAGNOSIS — C787 Secondary malignant neoplasm of liver and intrahepatic bile duct: Secondary | ICD-10-CM | POA: Insufficient documentation

## 2023-06-20 DIAGNOSIS — Z86718 Personal history of other venous thrombosis and embolism: Secondary | ICD-10-CM | POA: Diagnosis not present

## 2023-06-20 DIAGNOSIS — Z79899 Other long term (current) drug therapy: Secondary | ICD-10-CM | POA: Insufficient documentation

## 2023-06-20 DIAGNOSIS — C7931 Secondary malignant neoplasm of brain: Secondary | ICD-10-CM | POA: Diagnosis not present

## 2023-06-20 DIAGNOSIS — Z8349 Family history of other endocrine, nutritional and metabolic diseases: Secondary | ICD-10-CM | POA: Insufficient documentation

## 2023-06-20 DIAGNOSIS — Z8249 Family history of ischemic heart disease and other diseases of the circulatory system: Secondary | ICD-10-CM | POA: Diagnosis not present

## 2023-06-20 DIAGNOSIS — R160 Hepatomegaly, not elsewhere classified: Secondary | ICD-10-CM | POA: Insufficient documentation

## 2023-06-20 DIAGNOSIS — C3490 Malignant neoplasm of unspecified part of unspecified bronchus or lung: Secondary | ICD-10-CM

## 2023-06-20 DIAGNOSIS — Z90710 Acquired absence of both cervix and uterus: Secondary | ICD-10-CM | POA: Insufficient documentation

## 2023-06-20 DIAGNOSIS — Z87891 Personal history of nicotine dependence: Secondary | ICD-10-CM | POA: Insufficient documentation

## 2023-06-20 DIAGNOSIS — I700 Atherosclerosis of aorta: Secondary | ICD-10-CM | POA: Diagnosis not present

## 2023-06-20 DIAGNOSIS — Z5189 Encounter for other specified aftercare: Secondary | ICD-10-CM | POA: Diagnosis not present

## 2023-06-20 DIAGNOSIS — C3412 Malignant neoplasm of upper lobe, left bronchus or lung: Secondary | ICD-10-CM | POA: Diagnosis present

## 2023-06-20 DIAGNOSIS — R590 Localized enlarged lymph nodes: Secondary | ICD-10-CM | POA: Diagnosis not present

## 2023-06-20 DIAGNOSIS — Z7901 Long term (current) use of anticoagulants: Secondary | ICD-10-CM | POA: Insufficient documentation

## 2023-06-20 DIAGNOSIS — C770 Secondary and unspecified malignant neoplasm of lymph nodes of head, face and neck: Secondary | ICD-10-CM | POA: Insufficient documentation

## 2023-06-20 DIAGNOSIS — M4316 Spondylolisthesis, lumbar region: Secondary | ICD-10-CM | POA: Insufficient documentation

## 2023-06-20 DIAGNOSIS — E876 Hypokalemia: Secondary | ICD-10-CM | POA: Insufficient documentation

## 2023-06-20 DIAGNOSIS — Z83438 Family history of other disorder of lipoprotein metabolism and other lipidemia: Secondary | ICD-10-CM | POA: Diagnosis not present

## 2023-06-20 DIAGNOSIS — Z803 Family history of malignant neoplasm of breast: Secondary | ICD-10-CM | POA: Insufficient documentation

## 2023-06-20 DIAGNOSIS — E049 Nontoxic goiter, unspecified: Secondary | ICD-10-CM | POA: Insufficient documentation

## 2023-06-20 DIAGNOSIS — Z79630 Long term (current) use of alkylating agent: Secondary | ICD-10-CM | POA: Diagnosis not present

## 2023-06-20 LAB — CBC WITH DIFFERENTIAL/PLATELET
Abs Immature Granulocytes: 0.01 10*3/uL (ref 0.00–0.07)
Basophils Absolute: 0 10*3/uL (ref 0.0–0.1)
Basophils Relative: 1 %
Eosinophils Absolute: 0 10*3/uL (ref 0.0–0.5)
Eosinophils Relative: 1 %
HCT: 34.9 % — ABNORMAL LOW (ref 36.0–46.0)
Hemoglobin: 11.4 g/dL — ABNORMAL LOW (ref 12.0–15.0)
Immature Granulocytes: 0 %
Lymphocytes Relative: 19 %
Lymphs Abs: 0.7 10*3/uL (ref 0.7–4.0)
MCH: 30.6 pg (ref 26.0–34.0)
MCHC: 32.7 g/dL (ref 30.0–36.0)
MCV: 93.6 fL (ref 80.0–100.0)
Monocytes Absolute: 0.6 10*3/uL (ref 0.1–1.0)
Monocytes Relative: 17 %
Neutro Abs: 2.2 10*3/uL (ref 1.7–7.7)
Neutrophils Relative %: 62 %
Platelets: 277 10*3/uL (ref 150–400)
RBC: 3.73 MIL/uL — ABNORMAL LOW (ref 3.87–5.11)
RDW: 14.4 % (ref 11.5–15.5)
WBC: 3.5 10*3/uL — ABNORMAL LOW (ref 4.0–10.5)
nRBC: 0 % (ref 0.0–0.2)

## 2023-06-20 LAB — COMPREHENSIVE METABOLIC PANEL
ALT: 10 U/L (ref 0–44)
AST: 14 U/L — ABNORMAL LOW (ref 15–41)
Albumin: 4.2 g/dL (ref 3.5–5.0)
Alkaline Phosphatase: 72 U/L (ref 38–126)
Anion gap: 8 (ref 5–15)
BUN: 12 mg/dL (ref 8–23)
CO2: 25 mmol/L (ref 22–32)
Calcium: 9.4 mg/dL (ref 8.9–10.3)
Chloride: 105 mmol/L (ref 98–111)
Creatinine, Ser: 0.7 mg/dL (ref 0.44–1.00)
GFR, Estimated: >60 mL/min (ref >60)
Glucose, Bld: 87 mg/dL (ref 70–99)
Potassium: 3 mmol/L — ABNORMAL LOW (ref 3.5–5.1)
Sodium: 138 mmol/L (ref 135–145)
Total Bilirubin: 0.4 mg/dL (ref 0.3–1.2)
Total Protein: 7.2 g/dL (ref 6.5–8.1)

## 2023-06-20 MED ORDER — HEPARIN SOD (PORK) LOCK FLUSH 100 UNIT/ML IV SOLN
500.0000 [IU] | Freq: Once | INTRAVENOUS | Status: AC | PRN
  Administered 2023-06-20: 500 [IU]
  Filled 2023-06-20: qty 5

## 2023-06-20 MED ORDER — SODIUM CHLORIDE 0.9 % IV SOLN
Freq: Once | INTRAVENOUS | Status: AC
  Filled 2023-06-20: qty 250

## 2023-06-20 MED ORDER — SODIUM CHLORIDE 0.9 % IV SOLN
10.0000 mg | Freq: Once | INTRAVENOUS | Status: AC
  Administered 2023-06-20: 10 mg via INTRAVENOUS
  Filled 2023-06-20: qty 10

## 2023-06-20 MED ORDER — POTASSIUM CHLORIDE IN NACL 20-0.9 MEQ/L-% IV SOLN
Freq: Once | INTRAVENOUS | Status: AC
  Filled 2023-06-20: qty 1000

## 2023-06-20 MED ORDER — SODIUM CHLORIDE 0.9 % IV SOLN
2.5600 mg/m2 | Freq: Once | INTRAVENOUS | Status: AC
  Administered 2023-06-20: 4.6 mg via INTRAVENOUS
  Filled 2023-06-20: qty 9.2

## 2023-06-20 MED ORDER — POTASSIUM CHLORIDE CRYS ER 20 MEQ PO TBCR: 20.0000 meq | EXTENDED_RELEASE_TABLET | Freq: Every day | ORAL | 0 refills | Status: DC

## 2023-06-20 MED ORDER — PALONOSETRON HCL INJECTION 0.25 MG/5ML
0.2500 mg | Freq: Once | INTRAVENOUS | Status: AC
  Administered 2023-06-20: 0.25 mg via INTRAVENOUS
  Filled 2023-06-20: qty 5

## 2023-06-20 MED ORDER — LIDOCAINE-PRILOCAINE 2.5-2.5 % EX CREA: 1.0000 | TOPICAL_CREAM | CUTANEOUS | 1 refills | Status: DC | PRN

## 2023-06-20 NOTE — Patient Instructions (Signed)
[place] [facility] [place] [facility]  Discharge Instructions: Thank you for choosing [facility] to provide your oncology and hematology care.  If you have a lab appointment with the Cancer Center, please go directly to the Cancer Center and check in at the registration area.  Wear comfortable clothing and clothing appropriate for easy access to any Portacath or PICC line.   We strive to give you quality time with your provider. You may need to reschedule your appointment if you arrive late (15 or more minutes).  Arriving late affects you and other patients whose appointments are after yours.  Also, if you miss three or more appointments without notifying the office, you may be dismissed from the clinic at the provider's discretion.      For prescription refill requests, have your pharmacy contact our office and allow 72 hours for refills to be completed.    Today you received the following chemotherapy and/or immunotherapy agents lurbinectedin   To help prevent nausea and vomiting after your treatment, we encourage you to take your nausea medication as directed.  BELOW ARE SYMPTOMS THAT SHOULD BE REPORTED IMMEDIATELY: *FEVER GREATER THAN 100.4 F (38 C) OR HIGHER *CHILLS OR SWEATING *NAUSEA AND VOMITING THAT IS NOT CONTROLLED WITH YOUR NAUSEA MEDICATION *UNUSUAL SHORTNESS OF BREATH *UNUSUAL BRUISING OR BLEEDING *URINARY PROBLEMS (pain or burning when urinating, or frequent urination) *BOWEL PROBLEMS (unusual diarrhea, constipation, pain near the anus) TENDERNESS IN MOUTH AND THROAT WITH OR WITHOUT PRESENCE OF ULCERS (sore throat, sores in mouth, or a toothache) UNUSUAL RASH, SWELLING OR PAIN  UNUSUAL VAGINAL DISCHARGE OR ITCHING   Items with * indicate a potential emergency and should be followed up as soon as possible or go to the Emergency Department if any problems should occur.  Please show the CHEMOTHERAPY ALERT CARD or IMMUNOTHERAPY ALERT CARD at check-in to  the Emergency Department and triage nurse.  Should you have questions after your visit or need to cancel or reschedule your appointment, please contact [facility] [facility] [place] [facility] [place] [facility]  [phone] and follow the prompts.  Office hours are 8:00 a.m. to 4:30 p.m. Monday - Friday. Please note that voicemails left after 4:00 p.m. may not be returned until the following business day.  We are closed weekends and major holidays. You have access to a nurse at all times for urgent questions. Please call the main number to the clinic [phone] and follow the prompts.  For any non-urgent questions, you may also contact your provider using MyChart. We now offer e-Visits for anyone 18 and older to request care online for non-urgent symptoms. For details visit [email or web address].   Also download the MyChart app! Go to the app store, search "MyChart", open the app, select [facility], and log in with your MyChart username and password.    

## 2023-06-20 NOTE — Progress Notes (Signed)
Hematology/Oncology Consult note [facility]  [phone] [phone]  Patient Care Team: Mick Sell, MD as PCP - General (Infectious Diseases) Glory Buff, RN as Oncology Nurse Navigator Creig Hines, MD as Consulting Physician (Hematology and Oncology)   Name of the patient: Doris Johnson  [id number]  1950-12-20   Date of visit: 06/20/23  Diagnosis- extensive stage small cell lung cancer with liver lymph node and brain metastases   Chief complaint/ Reason for visit-on treatment assessment prior to cycle 14 of lurbinectedin  Heme/Onc history: Patient is a 72 year old female with a remote history of smoking in her teenage years and exposure to passive smoking.  She finally had a CT chest with contrast done in October 2021 which showed a large mass in the left side of the mediastinum measuring 6.7 x 6.2 x 6.1 cm causing severe compression of the left main pulmonary artery and around the aortic arch in the left subclavian artery.  Enlarged thyroid gland.  Left upper lobe nodule 1 x 0.7 cm.  She then underwent bronchoscopy with Dr.Aleskerov left upper lobe biopsy was nondiagnostic.  However station 4, 7 and 10 L lymph nodes were consistent with metastatic small cell carcinoma.   PET CT scan showed enlarged level 3 cervical lymph node 2.2 cm that was hypermetabolic with an SUV of 9.7.  Large central 7.3 cm AP window mass.  5.7 cm left liver mass.  MRI brain with and without contrast showed 2 small foci of enhancement in the right cerebellar hemisphere and right temporal lobe without mass-effect or edema as well concerning for brain metastases   Patient had 2 cycles of carbo etoposide chemotherapy along with Tecentriq and subsequent scan showed no significant improvement in the primary lung mass or liver mass.  She received radiation treatment to her primary lung mass and also seen by [facility] for second opinion.  Her pathology was reviewed at  [facility] and reported as possible neuroendocrine neoplasm But stated that small cell carcinoma cannot be excluded but not definitely diagnostic for it.  However [facility] pathology feels that this is consistent with small cell lung cancer.  She has completed 4 cycles of carbo etoposide Tecentriq chemotherapy and is currently on maintenance Tecentriq.  Repeat liver biopsy was also performed and was consistent with small cell lung cancer   Disease progression after 23 cycles of maintenance Tecentriq in the left supraclavicular lymph node region.  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  Plan to switch patient to second line lurbinectedin since November 2023  Interval history-no acute issues since last visit.  She is doing well overall  ECOG PS- 1 Pain scale- 0 Opioid associated constipation- no  Review of systems- Review of Systems  Constitutional:  Positive for malaise/fatigue. Negative for chills, fever and weight loss.  HENT:  Negative for congestion, ear discharge and nosebleeds.   Eyes:  Negative for blurred vision.  Respiratory:  Negative for cough, hemoptysis, sputum production, shortness of breath and wheezing.   Cardiovascular:  Negative for chest pain, palpitations, orthopnea and claudication.  Gastrointestinal:  Negative for abdominal pain, blood in stool, constipation, diarrhea, heartburn, melena, nausea and vomiting.  Genitourinary:  Negative for dysuria, flank pain, frequency, hematuria and urgency.  Musculoskeletal:  Negative for back pain, joint pain and myalgias.  Skin:  Negative for rash.  Neurological:  Negative for dizziness, tingling, focal weakness, seizures, weakness and headaches.  Endo/Heme/Allergies:  Does not bruise/bleed easily.  Psychiatric/Behavioral:  Negative for depression  and suicidal ideas. The patient does not have insomnia.       No Known Allergies   Past Medical History:  Diagnosis Date   Cancer (HCC)    Cataract     Diabetes mellitus without complication (HCC)    Hyperlipidemia    Hypertension    Hypothyroidism    doctor's keeping an eye on thyroid    Lung cancer [facility])      Past Surgical History:  Procedure Laterality Date   ABDOMINAL HYSTERECTOMY     IR CV LINE INJECTION  09/28/2021   MYRINGOTOMY WITH TUBE PLACEMENT Right 12/29/2022   Procedure: MYRINGOTOMY WITH TUBE PLACEMENT;  Surgeon: Vernie Murders, MD;  Location: [facility];  Service: ENT;  Laterality: Right;  Diabetic   PORTA CATH INSERTION N/A 11/30/2020   Procedure: PORTA CATH INSERTION;  Surgeon: Annice Needy, MD;  Location: [facility] INVASIVE CV LAB;  Service: Cardiovascular;  Laterality: N/A;   VIDEO BRONCHOSCOPY WITH ENDOBRONCHIAL NAVIGATION N/A 10/21/2020   Procedure: VIDEO BRONCHOSCOPY WITH ENDOBRONCHIAL NAVIGATION;  Surgeon: Vida Rigger, MD;  Location: [facility] ORS;  Service: Thoracic;  Laterality: N/A;   VIDEO BRONCHOSCOPY WITH ENDOBRONCHIAL ULTRASOUND N/A 10/21/2020   Procedure: VIDEO BRONCHOSCOPY WITH ENDOBRONCHIAL ULTRASOUND;  Surgeon: Vida Rigger, MD;  Location: [facility] ORS;  Service: Thoracic;  Laterality: N/A;    Social History   Socioeconomic History   Marital status: Single    Spouse name: Not on file   Number of children: Not on file   Years of education: Not on file   Highest education level: Not on file  Occupational History   Not on file  Tobacco Use   Smoking status: Never   Smokeless tobacco: Never  Vaping Use   Vaping status: Never Used  Substance and Sexual Activity   Alcohol use: No   Drug use: No   Sexual activity: Not Currently  Other Topics Concern   Not on file  Social History Narrative   Not on file   Social Determinants of Health   Financial Resource Strain: Low Risk  (03/22/2023)   Received from [facility], [facility]   Overall Financial Resource Strain (CARDIA)    Difficulty of Paying Living Expenses: Not hard at all  Food Insecurity: No Food  Insecurity (03/22/2023)   Received from [facility], [facility]   Hunger Vital Sign    Worried About Running Out of Food in the Last Year: Never true    Ran Out of Food in the Last Year: Never true  Transportation Needs: Unmet Transportation Needs (03/28/2023)   PRAPARE - Administrator, Civil Service (Medical): Yes    Lack of Transportation (Non-Medical): Yes  Physical Activity: Not on file  Stress: Not on file  Social Connections: Not on file  Intimate Partner Violence: Not on file    Family History  Problem Relation Age of Onset   Cancer Mother        breast   Hypertension Mother    Breast cancer Mother 40   Heart disease Father    Hyperlipidemia Brother    Heart disease Brother      Current Outpatient Medications:    apixaban (ELIQUIS) 5 MG TABS tablet, Take 1 tablet (5 mg total) by mouth 2 (two) times daily., Disp: 60 tablet, Rfl: 1   fluticasone (FLONASE) 50 MCG/ACT nasal spray, Place 2 sprays into both nostrils daily., Disp: 16 g, Rfl: 5   losartan (COZAAR) 25 MG  tablet, Take 1 tablet by mouth daily., Disp: , Rfl:    metFORMIN (GLUCOPHAGE-XR) 500 MG 24 hr tablet, Take by mouth., Disp: , Rfl:    lidocaine-prilocaine (EMLA) cream, Apply 1 Application topically as needed. Apply small amount to port site at least 1 hour prior to it being accessed, cover with plastic wrap, Disp: 30 g, Rfl: 1   potassium chloride SA (KLOR-CON M) 20 MEQ tablet, Take 1 tablet (20 mEq total) by mouth daily., Disp: 30 tablet, Rfl: 0 No current facility-administered medications for this visit.  Facility-Administered Medications Ordered in Other Visits:    heparin lock flush 100 unit/mL, 500 Units, Intracatheter, Once PRN, Creig Hines, MD   lurbinectedin (ZEPZELCA) 4.6 mg in sodium chloride 0.9 % 250 mL chemo infusion, 2.56 mg/m2 (Treatment Plan Recorded), Intravenous, Once, Creig Hines, MD, Last Rate: 259 mL/hr at 06/20/23 1223, 4.6 mg at 06/20/23  1223   sodium chloride flush (NS) 0.9 % injection 10 mL, 10 mL, Intravenous, PRN, Creig Hines, MD, 10 mL at 12/15/20 0850   sodium chloride flush (NS) 0.9 % injection 10 mL, 10 mL, Intravenous, PRN, Creig Hines, MD, 10 mL at 03/07/23 0923  Physical exam:  Vitals:   06/20/23 1004  BP: 122/81  Pulse: 74  Resp: 18  Temp: (!) 96.5 F (35.8 C)  TempSrc: Tympanic  SpO2: 100%  Weight: 163 lb 12.8 oz (74.3 kg)  Height: 5\' 6"  (1.676 m)   Physical Exam Cardiovascular:     Rate and Rhythm: Normal rate and regular rhythm.     Heart sounds: Normal heart sounds.  Pulmonary:     Effort: Pulmonary effort is normal.     Breath sounds: Normal breath sounds.  Abdominal:     General: Bowel sounds are normal.     Palpations: Abdomen is soft.  Skin:    General: Skin is warm and dry.  Neurological:     Mental Status: She is alert and oriented to person, place, and time.         Latest Ref Rng & Units 06/20/2023    9:54 AM  CMP  Glucose 70 - 99 mg/dL 87   BUN 8 - 23 mg/dL 12   Creatinine 6.04 - 1.00 mg/dL 5.40   Sodium 981 - 191 mmol/L 138   Potassium 3.5 - 5.1 mmol/L 3.0   Chloride 98 - 111 mmol/L 105   CO2 22 - 32 mmol/L 25   Calcium 8.9 - 10.3 mg/dL 9.4   Total Protein 6.5 - 8.1 g/dL 7.2   Total Bilirubin 0.3 - 1.2 mg/dL 0.4   Alkaline Phos 38 - 126 U/L 72   AST 15 - 41 U/L 14   ALT 0 - 44 U/L 10       Latest Ref Rng & Units 06/20/2023    9:54 AM  CBC  WBC 4.0 - 10.5 K/uL 3.5   Hemoglobin 12.0 - 15.0 g/dL 47.8   Hematocrit 29.5 - 46.0 % 34.9   Platelets 150 - 400 K/uL 277     No images are attached to the encounter.  CT CHEST ABDOMEN PELVIS W CONTRAST  Result Date: 06/15/2023 CLINICAL DATA:  Small-cell lung cancer with liver metastasis and brain metastasis diagnosed in 2021. Chemotherapy and radiation therapy. On chemotherapy. * Tracking Code: BO * EXAM: CT CHEST, ABDOMEN, AND PELVIS WITH CONTRAST TECHNIQUE: Multidetector CT imaging of the chest, abdomen and pelvis was  performed following the standard protocol during bolus administration of intravenous contrast. RADIATION  DOSE REDUCTION: This exam was performed according to the departmental dose-optimization program which includes automated exposure control, adjustment of the mA and/or kV according to patient size and/or use of iterative reconstruction technique. CONTRAST:  OMNIPAQUE IOHEXOL 300 MG/ML  SOLN COMPARISON:  02/07/2023 FINDINGS: CT CHEST FINDINGS Cardiovascular: Right Port-A-Cath tip high right atrium. Aortic atherosclerosis. Tortuous thoracic aorta. Mild cardiomegaly, without pericardial effusion. No central pulmonary embolism, on this non-dedicated study. Mediastinum/Nodes: Index left supraclavicular node measures 11 mm on 03/02 versus 12 mm on the prior. A node just cephalad to this is incompletely imaged at 1.4 cm on 01/02 versus 1.6 cm previously. Heterogeneous enlarged thyroid is of doubtful clinical significance given comorbidities. (ref: J Am Coll Radiol. 2015 Feb;12(2): 143-50). No mediastinal or hilar adenopathy. Lungs/Pleura: No pleural fluid. Left medial upper lobe architectural distortion and interstitial thickening are similar and presumably radiation induced. No local recurrence. Millimetric subpleural predominant pulmonary nodules are unchanged. Index left lower lobe 5 mm nodule on 60/3. Index nodule on the right major fissure is similar at 3 mm on 67/3. Musculoskeletal: No acute osseous abnormality. CT ABDOMEN PELVIS FINDINGS Hepatobiliary: Pericholecystic hepatic hypoattenuation measures 2.6 x 2.1 cm on 49/2 versus similar to the prior (when remeasured). Normal gallbladder, without biliary ductal dilatation. Pancreas: Normal, without mass or ductal dilatation. Spleen:  Normal in size, without focal abnormality. Adrenals/Urinary Tract: Normal adrenal glands. Normal kidneys, without hydronephrosis. Normal urinary bladder. Stomach/Bowel: Gastric antral apparent wall thickening is likely due to  underdistention. Colonic stool burden suggests constipation. Normal terminal ileum and appendix. Normal small bowel. Vascular/Lymphatic: Aortic atherosclerosis. No abdominopelvic adenopathy. Reproductive: Hysterectomy.  No adnexal mass. Other: No significant free fluid. Mild pelvic floor laxity. No free intraperitoneal air. No evidence of omental or peritoneal disease. Musculoskeletal: Thoracolumbar spondylosis. Trace L4-5 anterolisthesis. IMPRESSION: 1. Similar radiation fibrosis within the medial left upper lobe. No local recurrence. 2. Improved left supraclavicular adenopathy. 3. Similar reported treated metastasis in the pericholecystic left lower lobe. 4. Similar subpleural predominant pulmonary nodules, favored to represent subpleural lymph nodes. 5. The previously detailed prominent groin lymph nodes are less impressive today, favoring a reactive etiology. Electronically Signed   By: Jeronimo Greaves M.D.   On: 06/15/2023 13:40     Assessment and plan- Patient is a 72 y.o. female with extensive stage small cell lung cancer with liver and supraclavicular lymph node metastases .  She is here for on treatment assessment prior to cycle 15 of lurbinectedin  I have reviewed CT chest abdomen pelvis images independently and discussed findings with the patient which shows overall stable disease.  Left supraclavicular lymph node has decreased in size.  Lung mass and liver mass which have been previously treated are overall stable.  No evidence of progressive disease.  Continue lurbinectedin until progression or toxicity.  I will see her back in 3 weeks for cycle 16.  She receives growth factor support with each cycle.  Hypokalemia: I have emphasized the importance of taking oral potassium daily and we will give her 20 mg of IV potassium today  History of renal venous thrombosis: Continue Eliquis   Visit Diagnosis 1. Small cell carcinoma of lung metastatic to liver (HCC)   2. Encounter for antineoplastic  chemotherapy      Dr. Owens Shark, MD, MPH [facility] at [facility] [phone] 06/20/2023 1:03 PM

## 2023-06-22 ENCOUNTER — Inpatient Hospital Stay: Payer: 59

## 2023-06-22 DIAGNOSIS — C3490 Malignant neoplasm of unspecified part of unspecified bronchus or lung: Secondary | ICD-10-CM

## 2023-06-22 DIAGNOSIS — Z5111 Encounter for antineoplastic chemotherapy: Secondary | ICD-10-CM | POA: Diagnosis not present

## 2023-06-22 MED ORDER — PEGFILGRASTIM-CBQV 6 MG/0.6ML ~~LOC~~ SOSY
6.0000 mg | PREFILLED_SYRINGE | Freq: Once | SUBCUTANEOUS | Status: AC
  Administered 2023-06-22: 6 mg via SUBCUTANEOUS
  Filled 2023-06-22: qty 0.6

## 2023-07-10 MED FILL — Dexamethasone Sodium Phosphate Inj 100 MG/10ML: INTRAMUSCULAR | Qty: 1 | Status: AC

## 2023-07-11 ENCOUNTER — Inpatient Hospital Stay: Payer: Medicaid Other | Admitting: Oncology

## 2023-07-11 ENCOUNTER — Inpatient Hospital Stay: Payer: 59

## 2023-07-11 ENCOUNTER — Encounter: Payer: Self-pay | Admitting: Oncology

## 2023-07-11 VITALS — BP 151/62 | HR 58 | Temp 96.5°F | Resp 18 | Ht 66.0 in | Wt 166.4 lb

## 2023-07-11 VITALS — BP 139/68 | HR 60 | Temp 97.4°F | Resp 19

## 2023-07-11 DIAGNOSIS — Z5111 Encounter for antineoplastic chemotherapy: Secondary | ICD-10-CM | POA: Diagnosis not present

## 2023-07-11 DIAGNOSIS — T451X5A Adverse effect of antineoplastic and immunosuppressive drugs, initial encounter: Secondary | ICD-10-CM

## 2023-07-11 DIAGNOSIS — C3490 Malignant neoplasm of unspecified part of unspecified bronchus or lung: Secondary | ICD-10-CM

## 2023-07-11 DIAGNOSIS — E876 Hypokalemia: Secondary | ICD-10-CM

## 2023-07-11 DIAGNOSIS — D701 Agranulocytosis secondary to cancer chemotherapy: Secondary | ICD-10-CM | POA: Diagnosis not present

## 2023-07-11 DIAGNOSIS — Z7901 Long term (current) use of anticoagulants: Secondary | ICD-10-CM | POA: Diagnosis not present

## 2023-07-11 DIAGNOSIS — C787 Secondary malignant neoplasm of liver and intrahepatic bile duct: Secondary | ICD-10-CM

## 2023-07-11 LAB — CBC WITH DIFFERENTIAL/PLATELET
Abs Immature Granulocytes: 0 10*3/uL (ref 0.00–0.07)
Basophils Absolute: 0 10*3/uL (ref 0.0–0.1)
Basophils Relative: 0 %
Eosinophils Absolute: 0.1 10*3/uL (ref 0.0–0.5)
Eosinophils Relative: 2 %
HCT: 32.8 % — ABNORMAL LOW (ref 36.0–46.0)
Hemoglobin: 10.8 g/dL — ABNORMAL LOW (ref 12.0–15.0)
Immature Granulocytes: 0 %
Lymphocytes Relative: 24 %
Lymphs Abs: 0.7 10*3/uL (ref 0.7–4.0)
MCH: 31.1 pg (ref 26.0–34.0)
MCHC: 32.9 g/dL (ref 30.0–36.0)
MCV: 94.5 fL (ref 80.0–100.0)
Monocytes Absolute: 0.5 10*3/uL (ref 0.1–1.0)
Monocytes Relative: 16 %
Neutro Abs: 1.8 10*3/uL (ref 1.7–7.7)
Neutrophils Relative %: 58 %
Platelets: 280 10*3/uL (ref 150–400)
RBC: 3.47 MIL/uL — ABNORMAL LOW (ref 3.87–5.11)
RDW: 14.6 % (ref 11.5–15.5)
WBC: 3.1 10*3/uL — ABNORMAL LOW (ref 4.0–10.5)
nRBC: 0 % (ref 0.0–0.2)

## 2023-07-11 LAB — COMPREHENSIVE METABOLIC PANEL
ALT: 13 U/L (ref 0–44)
AST: 13 U/L — ABNORMAL LOW (ref 15–41)
Albumin: 3.9 g/dL (ref 3.5–5.0)
Alkaline Phosphatase: 70 U/L (ref 38–126)
Anion gap: 6 (ref 5–15)
BUN: 13 mg/dL (ref 8–23)
CO2: 23 mmol/L (ref 22–32)
Calcium: 9.2 mg/dL (ref 8.9–10.3)
Chloride: 109 mmol/L (ref 98–111)
Creatinine, Ser: 0.53 mg/dL (ref 0.44–1.00)
GFR, Estimated: >60 mL/min (ref >60)
Glucose, Bld: 91 mg/dL (ref 70–99)
Potassium: 3.7 mmol/L (ref 3.5–5.1)
Sodium: 138 mmol/L (ref 135–145)
Total Bilirubin: 0.4 mg/dL (ref 0.3–1.2)
Total Protein: 6.9 g/dL (ref 6.5–8.1)

## 2023-07-11 MED ORDER — SODIUM CHLORIDE 0.9 % IV SOLN
2.5600 mg/m2 | Freq: Once | INTRAVENOUS | Status: AC
  Administered 2023-07-11: 4.6 mg via INTRAVENOUS
  Filled 2023-07-11: qty 9.2

## 2023-07-11 MED ORDER — SODIUM CHLORIDE 0.9 % IV SOLN
Freq: Once | INTRAVENOUS | Status: AC
  Filled 2023-07-11: qty 250

## 2023-07-11 MED ORDER — HEPARIN SOD (PORK) LOCK FLUSH 100 UNIT/ML IV SOLN
500.0000 [IU] | Freq: Once | INTRAVENOUS | Status: AC | PRN
  Administered 2023-07-11: 500 [IU]
  Filled 2023-07-11: qty 5

## 2023-07-11 MED ORDER — PALONOSETRON HCL INJECTION 0.25 MG/5ML
0.2500 mg | Freq: Once | INTRAVENOUS | Status: AC
  Administered 2023-07-11: 0.25 mg via INTRAVENOUS
  Filled 2023-07-11: qty 5

## 2023-07-11 MED ORDER — SODIUM CHLORIDE 0.9 % IV SOLN
10.0000 mg | Freq: Once | INTRAVENOUS | Status: AC
  Administered 2023-07-11: 10 mg via INTRAVENOUS
  Filled 2023-07-11: qty 10

## 2023-07-11 NOTE — Progress Notes (Signed)
Hematology/Oncology Consult note [facility]  Telephone:([phone] [phone]  Patient Care Team: Mick Sell, MD as PCP - General (Infectious Diseases) Glory Buff, RN as Oncology Nurse Navigator Creig Hines, MD as Consulting Physician (Hematology and Oncology)   Name of the patient: Doris Johnson  [id number]  04/23/1951   Date of visit: 07/11/23  Diagnosis- extensive stage small cell lung cancer with liver lymph node and brain metastases   Chief complaint/ Reason for visit-on treatment assessment prior to cycle 15 of lurbinectedin  Heme/Onc history: Patient is a 72 year old female with a remote history of smoking in her teenage years and exposure to passive smoking.  She finally had a CT chest with contrast done in October 2021 which showed a large mass in the left side of the mediastinum measuring 6.7 x 6.2 x 6.1 cm causing severe compression of the left main pulmonary artery and around the aortic arch in the left subclavian artery.  Enlarged thyroid gland.  Left upper lobe nodule 1 x 0.7 cm.  She then underwent bronchoscopy with Dr.Aleskerov left upper lobe biopsy was nondiagnostic.  However station 4, 7 and 10 L lymph nodes were consistent with metastatic small cell carcinoma.   PET CT scan showed enlarged level 3 cervical lymph node 2.2 cm that was hypermetabolic with an SUV of 9.7.  Large central 7.3 cm AP window mass.  5.7 cm left liver mass.  MRI brain with and without contrast showed 2 small foci of enhancement in the right cerebellar hemisphere and right temporal lobe without mass-effect or edema as well concerning for brain metastases   Patient had 2 cycles of carbo etoposide chemotherapy along with Tecentriq and subsequent scan showed no significant improvement in the primary lung mass or liver mass.  She received radiation treatment to her primary lung mass and also seen by [facility] for second opinion.  Her pathology was reviewed at  [facility] and reported as possible neuroendocrine neoplasm But stated that small cell carcinoma cannot be excluded but not definitely diagnostic for it.  However [facility] pathology feels that this is consistent with small cell lung cancer.  She has completed 4 cycles of carbo etoposide Tecentriq chemotherapy and is currently on maintenance Tecentriq.  Repeat liver biopsy was also performed and was consistent with small cell lung cancer   Disease progression after 23 cycles of maintenance Tecentriq in the left supraclavicular lymph node region.  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  Plan to switch patient to second line lurbinectedin since November 2023  Interval history-tolerating treatments well and denies any complaints at this time.  Appetite and weight have remained stable.  Denies any new aches and pains anywhere  ECOG PS- 1 Pain scale- 0   Review of systems- Review of Systems  Constitutional:  Negative for chills, fever, malaise/fatigue and weight loss.  HENT:  Negative for congestion, ear discharge and nosebleeds.   Eyes:  Negative for blurred vision.  Respiratory:  Negative for cough, hemoptysis, sputum production, shortness of breath and wheezing.   Cardiovascular:  Negative for chest pain, palpitations, orthopnea and claudication.  Gastrointestinal:  Negative for abdominal pain, blood in stool, constipation, diarrhea, heartburn, melena, nausea and vomiting.  Genitourinary:  Negative for dysuria, flank pain, frequency, hematuria and urgency.  Musculoskeletal:  Negative for back pain, joint pain and myalgias.  Skin:  Negative for rash.  Neurological:  Negative for dizziness, tingling, focal weakness, seizures, weakness and headaches.  Endo/Heme/Allergies:  Does not  bruise/bleed easily.  Psychiatric/Behavioral:  Negative for depression and suicidal ideas. The patient does not have insomnia.       No Known Allergies   Past Medical History:   Diagnosis Date   Cancer (HCC)    Cataract    Diabetes mellitus without complication (HCC)    Hyperlipidemia    Hypertension    Hypothyroidism    doctor's keeping an eye on thyroid    Lung cancer [facility])      Past Surgical History:  Procedure Laterality Date   ABDOMINAL HYSTERECTOMY     IR CV LINE INJECTION  09/28/2021   MYRINGOTOMY WITH TUBE PLACEMENT Right 12/29/2022   Procedure: MYRINGOTOMY WITH TUBE PLACEMENT;  Surgeon: Vernie Murders, MD;  Location: [facility];  Service: ENT;  Laterality: Right;  Diabetic   PORTA CATH INSERTION N/A 11/30/2020   Procedure: PORTA CATH INSERTION;  Surgeon: Annice Needy, MD;  Location: [facility] INVASIVE CV LAB;  Service: Cardiovascular;  Laterality: N/A;   VIDEO BRONCHOSCOPY WITH ENDOBRONCHIAL NAVIGATION N/A 10/21/2020   Procedure: VIDEO BRONCHOSCOPY WITH ENDOBRONCHIAL NAVIGATION;  Surgeon: Vida Rigger, MD;  Location: [facility] ORS;  Service: Thoracic;  Laterality: N/A;   VIDEO BRONCHOSCOPY WITH ENDOBRONCHIAL ULTRASOUND N/A 10/21/2020   Procedure: VIDEO BRONCHOSCOPY WITH ENDOBRONCHIAL ULTRASOUND;  Surgeon: Vida Rigger, MD;  Location: [facility] ORS;  Service: Thoracic;  Laterality: N/A;    Social History   Socioeconomic History   Marital status: Single    Spouse name: Not on file   Number of children: Not on file   Years of education: Not on file   Highest education level: Not on file  Occupational History   Not on file  Tobacco Use   Smoking status: Never   Smokeless tobacco: Never  Vaping Use   Vaping status: Never Used  Substance and Sexual Activity   Alcohol use: No   Drug use: No   Sexual activity: Not Currently  Other Topics Concern   Not on file  Social History Narrative   Not on file   Social Determinants of Health   Financial Resource Strain: Low Risk  (03/22/2023)   Received from [facility], [facility]   Overall Financial Resource Strain (CARDIA)    Difficulty of Paying Living Expenses:  Not hard at all  Food Insecurity: No Food Insecurity (03/22/2023)   Received from [facility], [facility]   Hunger Vital Sign    Worried About Running Out of Food in the Last Year: Never true    Ran Out of Food in the Last Year: Never true  Transportation Needs: Unmet Transportation Needs (03/28/2023)   PRAPARE - Administrator, Civil Service (Medical): Yes    Lack of Transportation (Non-Medical): Yes  Physical Activity: Not on file  Stress: Not on file  Social Connections: Not on file  Intimate Partner Violence: Not on file    Family History  Problem Relation Age of Onset   Cancer Mother        breast   Hypertension Mother    Breast cancer Mother 83   Heart disease Father    Hyperlipidemia Brother    Heart disease Brother      Current Outpatient Medications:    apixaban (ELIQUIS) 5 MG TABS tablet, Take 1 tablet (5 mg total) by mouth 2 (two) times daily., Disp: 60 tablet, Rfl: 1   fluticasone (FLONASE) 50 MCG/ACT nasal spray, Place 2 sprays into both nostrils daily., Disp: 16 g,  Rfl: 5   lidocaine-prilocaine (EMLA) cream, Apply 1 Application topically as needed. Apply small amount to port site at least 1 hour prior to it being accessed, cover with plastic wrap, Disp: 30 g, Rfl: 1   losartan (COZAAR) 25 MG tablet, Take 1 tablet by mouth daily., Disp: , Rfl:    metFORMIN (GLUCOPHAGE-XR) 500 MG 24 hr tablet, Take by mouth., Disp: , Rfl:    potassium chloride SA (KLOR-CON M) 20 MEQ tablet, Take 1 tablet (20 mEq total) by mouth daily., Disp: 30 tablet, Rfl: 0 No current facility-administered medications for this visit.  Facility-Administered Medications Ordered in Other Visits:    sodium chloride flush (NS) 0.9 % injection 10 mL, 10 mL, Intravenous, PRN, Creig Hines, MD, 10 mL at 12/15/20 0850   sodium chloride flush (NS) 0.9 % injection 10 mL, 10 mL, Intravenous, PRN, Creig Hines, MD, 10 mL at 03/07/23 0923  Physical exam:   Vitals:   07/11/23 0943  BP: (!) 151/62  Pulse: (!) 58  Resp: 18  Temp: (!) 96.5 F (35.8 C)  TempSrc: Tympanic  SpO2: 98%  Weight: 166 lb 6.4 oz (75.5 kg)  Height: 5\' 6"  (1.676 m)   Physical Exam Cardiovascular:     Rate and Rhythm: Normal rate and regular rhythm.     Heart sounds: Normal heart sounds.  Pulmonary:     Effort: Pulmonary effort is normal.     Breath sounds: Normal breath sounds.  Abdominal:     General: Bowel sounds are normal.     Palpations: Abdomen is soft.  Skin:    General: Skin is warm and dry.  Neurological:     Mental Status: She is alert and oriented to person, place, and time.         Latest Ref Rng & Units 07/11/2023    9:31 AM  CMP  Glucose 70 - 99 mg/dL 91   BUN 8 - 23 mg/dL 13   Creatinine 1.61 - 1.00 mg/dL 0.96   Sodium 045 - 409 mmol/L 138   Potassium 3.5 - 5.1 mmol/L 3.7   Chloride 98 - 111 mmol/L 109   CO2 22 - 32 mmol/L 23   Calcium 8.9 - 10.3 mg/dL 9.2   Total Protein 6.5 - 8.1 g/dL 6.9   Total Bilirubin 0.3 - 1.2 mg/dL 0.4   Alkaline Phos 38 - 126 U/L 70   AST 15 - 41 U/L 13   ALT 0 - 44 U/L 13       Latest Ref Rng & Units 07/11/2023    9:31 AM  CBC  WBC 4.0 - 10.5 K/uL 3.1   Hemoglobin 12.0 - 15.0 g/dL 81.1   Hematocrit 91.4 - 46.0 % 32.8   Platelets 150 - 400 K/uL 280     No images are attached to the encounter.  CT CHEST ABDOMEN PELVIS W CONTRAST  Result Date: 06/15/2023 CLINICAL DATA:  Small-cell lung cancer with liver metastasis and brain metastasis diagnosed in 2021. Chemotherapy and radiation therapy. On chemotherapy. * Tracking Code: BO * EXAM: CT CHEST, ABDOMEN, AND PELVIS WITH CONTRAST TECHNIQUE: Multidetector CT imaging of the chest, abdomen and pelvis was performed following the standard protocol during bolus administration of intravenous contrast. RADIATION DOSE REDUCTION: This exam was performed according to the departmental dose-optimization program which includes automated exposure control, adjustment of  the mA and/or kV according to patient size and/or use of iterative reconstruction technique. CONTRAST:  OMNIPAQUE IOHEXOL 300 MG/ML  SOLN COMPARISON:  02/07/2023 FINDINGS: CT CHEST FINDINGS Cardiovascular: Right Port-A-Cath tip high right atrium. Aortic atherosclerosis. Tortuous thoracic aorta. Mild cardiomegaly, without pericardial effusion. No central pulmonary embolism, on this non-dedicated study. Mediastinum/Nodes: Index left supraclavicular node measures 11 mm on 03/02 versus 12 mm on the prior. A node just cephalad to this is incompletely imaged at 1.4 cm on 01/02 versus 1.6 cm previously. Heterogeneous enlarged thyroid is of doubtful clinical significance given comorbidities. (ref: J Am Coll Radiol. 2015 Feb;12(2): 143-50). No mediastinal or hilar adenopathy. Lungs/Pleura: No pleural fluid. Left medial upper lobe architectural distortion and interstitial thickening are similar and presumably radiation induced. No local recurrence. Millimetric subpleural predominant pulmonary nodules are unchanged. Index left lower lobe 5 mm nodule on 60/3. Index nodule on the right major fissure is similar at 3 mm on 67/3. Musculoskeletal: No acute osseous abnormality. CT ABDOMEN PELVIS FINDINGS Hepatobiliary: Pericholecystic hepatic hypoattenuation measures 2.6 x 2.1 cm on 49/2 versus similar to the prior (when remeasured). Normal gallbladder, without biliary ductal dilatation. Pancreas: Normal, without mass or ductal dilatation. Spleen:  Normal in size, without focal abnormality. Adrenals/Urinary Tract: Normal adrenal glands. Normal kidneys, without hydronephrosis. Normal urinary bladder. Stomach/Bowel: Gastric antral apparent wall thickening is likely due to underdistention. Colonic stool burden suggests constipation. Normal terminal ileum and appendix. Normal small bowel. Vascular/Lymphatic: Aortic atherosclerosis. No abdominopelvic adenopathy. Reproductive: Hysterectomy.  No adnexal mass. Other: No significant  free fluid. Mild pelvic floor laxity. No free intraperitoneal air. No evidence of omental or peritoneal disease. Musculoskeletal: Thoracolumbar spondylosis. Trace L4-5 anterolisthesis. IMPRESSION: 1. Similar radiation fibrosis within the medial left upper lobe. No local recurrence. 2. Improved left supraclavicular adenopathy. 3. Similar reported treated metastasis in the pericholecystic left lower lobe. 4. Similar subpleural predominant pulmonary nodules, favored to represent subpleural lymph nodes. 5. The previously detailed prominent groin lymph nodes are less impressive today, favoring a reactive etiology. Electronically Signed   By: Jeronimo Greaves M.D.   On: 06/15/2023 13:40     Assessment and plan- Patient is a 72 y.o. female with extensive stage small cell lung cancer here for on treatment assessment prior to cycle 16 of lurbinectedin  Counts okay to proceed with cycle 16 of lurbinectedin today and I will see her back in 3 weeks for cycle 17.  She has mild chronic chemo induced neutropenia and she has been getting Neulasta with every treatment.  Hemoglobin remained stable between 10-11.  Hypokalemia: Chronic continue oral potassium.  History of renal vein thrombosis: Continue Eliquis   Visit Diagnosis 1. Small cell carcinoma of lung metastatic to liver (HCC)   2. Encounter for antineoplastic chemotherapy   3. Chemotherapy induced neutropenia (HCC)   4. Hypokalemia   5. Current use of long term anticoagulation      Dr. Owens Shark, MD, MPH [facility] at [facility] [phone] 07/11/2023 12:48 PM

## 2023-07-11 NOTE — Patient Instructions (Addendum)
[place] [facility] AT [facility]  Discharge Instructions: Thank you for choosing [facility] to provide your oncology and hematology care.  If you have a lab appointment with the Cancer Center, please go directly to the Cancer Center and check in at the registration area.  Wear comfortable clothing and clothing appropriate for easy access to any Portacath or PICC line.   We strive to give you quality time with your provider. You may need to reschedule your appointment if you arrive late (15 or more minutes).  Arriving late affects you and other patients whose appointments are after yours.  Also, if you miss three or more appointments without notifying the office, you may be dismissed from the clinic at the provider's discretion.      For prescription refill requests, have your pharmacy contact our office and allow 72 hours for refills to be completed.    Today you received the following chemotherapy and/or immunotherapy agents zepzelca      To help prevent nausea and vomiting after your treatment, we encourage you to take your nausea medication as directed.  BELOW ARE SYMPTOMS THAT SHOULD BE REPORTED IMMEDIATELY: *FEVER GREATER THAN 100.4 F (38 C) OR HIGHER *CHILLS OR SWEATING *NAUSEA AND VOMITING THAT IS NOT CONTROLLED WITH YOUR NAUSEA MEDICATION *UNUSUAL SHORTNESS OF BREATH *UNUSUAL BRUISING OR BLEEDING *URINARY PROBLEMS (pain or burning when urinating, or frequent urination) *BOWEL PROBLEMS (unusual diarrhea, constipation, pain near the anus) TENDERNESS IN MOUTH AND THROAT WITH OR WITHOUT PRESENCE OF ULCERS (sore throat, sores in mouth, or a toothache) UNUSUAL RASH, SWELLING OR PAIN  UNUSUAL VAGINAL DISCHARGE OR ITCHING   Items with * indicate a potential emergency and should be followed up as soon as possible or go to the Emergency Department if any problems should occur.  Please show the CHEMOTHERAPY ALERT CARD or IMMUNOTHERAPY ALERT CARD at check-in to  the Emergency Department and triage nurse.  Should you have questions after your visit or need to cancel or reschedule your appointment, please contact [place] [facility]  [phone] and follow the prompts.  Office hours are 8:00 a.m. to 4:30 p.m. Monday - Friday. Please note that voicemails left after 4:00 p.m. may not be returned until the following business day.  We are closed weekends and major holidays. You have access to a nurse at all times for urgent questions. Please call the main number to the clinic [phone] and follow the prompts.  For any non-urgent questions, you may also contact your provider using MyChart. We now offer e-Visits for anyone 58 and older to request care online for non-urgent symptoms. For details visit [email or web address].   Also download the MyChart app! Go to the app store, search "MyChart", open the app, select [place], and log in with your MyChart username and password.

## 2023-07-13 ENCOUNTER — Inpatient Hospital Stay: Payer: 59

## 2023-07-13 DIAGNOSIS — Z5111 Encounter for antineoplastic chemotherapy: Secondary | ICD-10-CM | POA: Diagnosis not present

## 2023-07-13 DIAGNOSIS — C787 Secondary malignant neoplasm of liver and intrahepatic bile duct: Secondary | ICD-10-CM

## 2023-07-13 MED ORDER — PEGFILGRASTIM-CBQV 6 MG/0.6ML ~~LOC~~ SOSY
6.0000 mg | PREFILLED_SYRINGE | Freq: Once | SUBCUTANEOUS | Status: AC
  Administered 2023-07-13: 6 mg via SUBCUTANEOUS
  Filled 2023-07-13: qty 0.6

## 2023-07-24 ENCOUNTER — Encounter: Payer: Self-pay | Admitting: Oncology

## 2023-07-31 MED FILL — Dexamethasone Sodium Phosphate Inj 100 MG/10ML: INTRAMUSCULAR | Qty: 1 | Status: AC

## 2023-08-01 ENCOUNTER — Inpatient Hospital Stay: Payer: 59 | Attending: Oncology

## 2023-08-01 ENCOUNTER — Inpatient Hospital Stay (HOSPITAL_BASED_OUTPATIENT_CLINIC_OR_DEPARTMENT_OTHER): Payer: 59 | Admitting: Oncology

## 2023-08-01 ENCOUNTER — Encounter: Payer: Self-pay | Admitting: Oncology

## 2023-08-01 ENCOUNTER — Inpatient Hospital Stay: Payer: 59

## 2023-08-01 VITALS — BP 142/98 | HR 60 | Temp 96.6°F | Resp 18 | Ht 66.0 in | Wt 169.2 lb

## 2023-08-01 VITALS — BP 149/78 | HR 66

## 2023-08-01 DIAGNOSIS — C3490 Malignant neoplasm of unspecified part of unspecified bronchus or lung: Secondary | ICD-10-CM

## 2023-08-01 DIAGNOSIS — D701 Agranulocytosis secondary to cancer chemotherapy: Secondary | ICD-10-CM | POA: Diagnosis not present

## 2023-08-01 DIAGNOSIS — Z803 Family history of malignant neoplasm of breast: Secondary | ICD-10-CM | POA: Diagnosis not present

## 2023-08-01 DIAGNOSIS — Z90710 Acquired absence of both cervix and uterus: Secondary | ICD-10-CM | POA: Insufficient documentation

## 2023-08-01 DIAGNOSIS — Z5111 Encounter for antineoplastic chemotherapy: Secondary | ICD-10-CM | POA: Insufficient documentation

## 2023-08-01 DIAGNOSIS — Z79899 Other long term (current) drug therapy: Secondary | ICD-10-CM | POA: Diagnosis not present

## 2023-08-01 DIAGNOSIS — Z83438 Family history of other disorder of lipoprotein metabolism and other lipidemia: Secondary | ICD-10-CM | POA: Insufficient documentation

## 2023-08-01 DIAGNOSIS — T451X5A Adverse effect of antineoplastic and immunosuppressive drugs, initial encounter: Secondary | ICD-10-CM | POA: Diagnosis not present

## 2023-08-01 DIAGNOSIS — C787 Secondary malignant neoplasm of liver and intrahepatic bile duct: Secondary | ICD-10-CM | POA: Insufficient documentation

## 2023-08-01 DIAGNOSIS — C779 Secondary and unspecified malignant neoplasm of lymph node, unspecified: Secondary | ICD-10-CM | POA: Diagnosis not present

## 2023-08-01 DIAGNOSIS — E876 Hypokalemia: Secondary | ICD-10-CM | POA: Insufficient documentation

## 2023-08-01 DIAGNOSIS — Z79630 Long term (current) use of alkylating agent: Secondary | ICD-10-CM | POA: Insufficient documentation

## 2023-08-01 DIAGNOSIS — Z7901 Long term (current) use of anticoagulants: Secondary | ICD-10-CM | POA: Diagnosis not present

## 2023-08-01 DIAGNOSIS — Z8349 Family history of other endocrine, nutritional and metabolic diseases: Secondary | ICD-10-CM | POA: Diagnosis not present

## 2023-08-01 DIAGNOSIS — C3412 Malignant neoplasm of upper lobe, left bronchus or lung: Secondary | ICD-10-CM | POA: Diagnosis present

## 2023-08-01 DIAGNOSIS — C7931 Secondary malignant neoplasm of brain: Secondary | ICD-10-CM | POA: Diagnosis not present

## 2023-08-01 DIAGNOSIS — Z8249 Family history of ischemic heart disease and other diseases of the circulatory system: Secondary | ICD-10-CM | POA: Diagnosis not present

## 2023-08-01 DIAGNOSIS — Z5982 Transportation insecurity: Secondary | ICD-10-CM | POA: Insufficient documentation

## 2023-08-01 DIAGNOSIS — Z87891 Personal history of nicotine dependence: Secondary | ICD-10-CM | POA: Diagnosis not present

## 2023-08-01 LAB — CBC WITH DIFFERENTIAL/PLATELET
Abs Immature Granulocytes: 0.01 10*3/uL (ref 0.00–0.07)
Basophils Absolute: 0 10*3/uL (ref 0.0–0.1)
Basophils Relative: 1 %
Eosinophils Absolute: 0.1 10*3/uL (ref 0.0–0.5)
Eosinophils Relative: 2 %
HCT: 33.7 % — ABNORMAL LOW (ref 36.0–46.0)
Hemoglobin: 11.1 g/dL — ABNORMAL LOW (ref 12.0–15.0)
Immature Granulocytes: 0 %
Lymphocytes Relative: 27 %
Lymphs Abs: 0.7 10*3/uL (ref 0.7–4.0)
MCH: 31.3 pg (ref 26.0–34.0)
MCHC: 32.9 g/dL (ref 30.0–36.0)
MCV: 94.9 fL (ref 80.0–100.0)
Monocytes Absolute: 0.4 10*3/uL (ref 0.1–1.0)
Monocytes Relative: 16 %
Neutro Abs: 1.4 10*3/uL — ABNORMAL LOW (ref 1.7–7.7)
Neutrophils Relative %: 54 %
Platelets: 285 10*3/uL (ref 150–400)
RBC: 3.55 MIL/uL — ABNORMAL LOW (ref 3.87–5.11)
RDW: 14.6 % (ref 11.5–15.5)
WBC: 2.5 10*3/uL — ABNORMAL LOW (ref 4.0–10.5)
nRBC: 0 % (ref 0.0–0.2)

## 2023-08-01 LAB — COMPREHENSIVE METABOLIC PANEL
ALT: 12 U/L (ref 0–44)
AST: 14 U/L — ABNORMAL LOW (ref 15–41)
Albumin: 4 g/dL (ref 3.5–5.0)
Alkaline Phosphatase: 66 U/L (ref 38–126)
Anion gap: 4 — ABNORMAL LOW (ref 5–15)
BUN: 14 mg/dL (ref 8–23)
CO2: 23 mmol/L (ref 22–32)
Calcium: 9.2 mg/dL (ref 8.9–10.3)
Chloride: 109 mmol/L (ref 98–111)
Creatinine, Ser: 0.62 mg/dL (ref 0.44–1.00)
GFR, Estimated: >60 mL/min (ref >60)
Glucose, Bld: 91 mg/dL (ref 70–99)
Potassium: 3.4 mmol/L — ABNORMAL LOW (ref 3.5–5.1)
Sodium: 136 mmol/L (ref 135–145)
Total Bilirubin: 0.6 mg/dL (ref 0.3–1.2)
Total Protein: 7 g/dL (ref 6.5–8.1)

## 2023-08-01 MED ORDER — HEPARIN SOD (PORK) LOCK FLUSH 100 UNIT/ML IV SOLN
500.0000 [IU] | Freq: Once | INTRAVENOUS | Status: AC | PRN
  Administered 2023-08-01: 500 [IU]
  Filled 2023-08-01: qty 5

## 2023-08-01 MED ORDER — SODIUM CHLORIDE 0.9 % IV SOLN
Freq: Once | INTRAVENOUS | Status: AC
  Filled 2023-08-01: qty 250

## 2023-08-01 MED ORDER — PALONOSETRON HCL INJECTION 0.25 MG/5ML
0.2500 mg | Freq: Once | INTRAVENOUS | Status: AC
  Administered 2023-08-01: 0.25 mg via INTRAVENOUS
  Filled 2023-08-01: qty 5

## 2023-08-01 MED ORDER — SODIUM CHLORIDE 0.9 % IV SOLN
10.0000 mg | Freq: Once | INTRAVENOUS | Status: AC
  Administered 2023-08-01: 10 mg via INTRAVENOUS
  Filled 2023-08-01: qty 10

## 2023-08-01 MED ORDER — SODIUM CHLORIDE 0.9 % IV SOLN
2.5600 mg/m2 | Freq: Once | INTRAVENOUS | Status: AC
  Administered 2023-08-01: 4.6 mg via INTRAVENOUS
  Filled 2023-08-01: qty 9.2

## 2023-08-01 NOTE — Patient Instructions (Addendum)
[facility]  Discharge Instructions: Thank you for choosing [facility] to provide your oncology and hematology care.  If you have a lab appointment with the Cancer Center, please go directly to the Cancer Center and check in at the registration area.  Wear comfortable clothing and clothing appropriate for easy access to any Portacath or PICC line.   We strive to give you quality time with your provider. You may need to reschedule your appointment if you arrive late (15 or more minutes).  Arriving late affects you and other patients whose appointments are after yours.  Also, if you miss three or more appointments without notifying the office, you may be dismissed from the clinic at the provider's discretion.      For prescription refill requests, have your pharmacy contact our office and allow 72 hours for refills to be completed.    Today you received the following chemotherapy and/or immunotherapy agents Zepzelda   To help prevent nausea and vomiting after your treatment, we encourage you to take your nausea medication as directed.  BELOW ARE SYMPTOMS THAT SHOULD BE REPORTED IMMEDIATELY: *FEVER GREATER THAN 100.4 F (38 C) OR HIGHER *CHILLS OR SWEATING *NAUSEA AND VOMITING THAT IS NOT CONTROLLED WITH YOUR NAUSEA MEDICATION *UNUSUAL SHORTNESS OF BREATH *UNUSUAL BRUISING OR BLEEDING *URINARY PROBLEMS (pain or burning when urinating, or frequent urination) *BOWEL PROBLEMS (unusual diarrhea, constipation, pain near the anus) TENDERNESS IN MOUTH AND THROAT WITH OR WITHOUT PRESENCE OF ULCERS (sore throat, sores in mouth, or a toothache) UNUSUAL RASH, SWELLING OR PAIN  UNUSUAL VAGINAL DISCHARGE OR ITCHING   Items with * indicate a potential emergency and should be followed up as soon as possible or go to the Emergency Department if any problems should occur.  Please show the CHEMOTHERAPY ALERT CARD or IMMUNOTHERAPY ALERT CARD at check-in to the  Emergency Department and triage nurse.  Should you have questions after your visit or need to cancel or reschedule your appointment, please contact [place] [facility] AT [facility]  [phone] and follow the prompts.  Office hours are 8:00 a.m. to 4:30 p.m. Monday - Friday. Please note that voicemails left after 4:00 p.m. may not be returned until the following business day.  We are closed weekends and major holidays. You have access to a nurse at all times for urgent questions. Please call the main number to the clinic [phone] and follow the prompts.  For any non-urgent questions, you may also contact your provider using MyChart. We now offer e-Visits for anyone 52 and older to request care online for non-urgent symptoms. For details visit [email or web address].   Also download the MyChart app! Go to the app store, search "MyChart", open the app, select , and log in with your MyChart username and password.

## 2023-08-01 NOTE — Progress Notes (Signed)
Hematology/Oncology Consult note [facility]  Telephone:([phone] [phone]  Patient Care Team: Mick Sell, MD as PCP - General (Infectious Diseases) Glory Buff, RN as Oncology Nurse Navigator Creig Hines, MD as Consulting Physician (Hematology and Oncology)   Name of the patient: Doris Johnson  [id number]  [birth year]   Date of visit: 08/01/23  Diagnosis- extensive stage small cell lung cancer with liver lymph node and brain metastases   Chief complaint/ Reason for visit-on treatment assessment prior to cycle 16 of lurbinectedin  Heme/Onc history:  Patient is a 72 year old female with a remote history of smoking in her teenage years and exposure to passive smoking.  She finally had a CT chest with contrast done in October 2021 which showed a large mass in the left side of the mediastinum measuring 6.7 x 6.2 x 6.1 cm causing severe compression of the left main pulmonary artery and around the aortic arch in the left subclavian artery.  Enlarged thyroid gland.  Left upper lobe nodule 1 x 0.7 cm.  She then underwent bronchoscopy with Dr.Aleskerov left upper lobe biopsy was nondiagnostic.  However station 4, 7 and 10 L lymph nodes were consistent with metastatic small cell carcinoma.   PET CT scan showed enlarged level 3 cervical lymph node 2.2 cm that was hypermetabolic with an SUV of 9.7.  Large central 7.3 cm AP window mass.  5.7 cm left liver mass.  MRI brain with and without contrast showed 2 small foci of enhancement in the right cerebellar hemisphere and right temporal lobe without mass-effect or edema as well concerning for brain metastases   Patient had 2 cycles of carbo etoposide chemotherapy along with Tecentriq and subsequent scan showed no significant improvement in the primary lung mass or liver mass.  She received radiation treatment to her primary lung mass and also seen by [facility] for second opinion.  Her pathology was reviewed  at [facility] and reported as possible neuroendocrine neoplasm But stated that small cell carcinoma cannot be excluded but not definitely diagnostic for it.  However [facility] pathology feels that this is consistent with small cell lung cancer.  She has completed 4 cycles of carbo etoposide Tecentriq chemotherapy and is currently on maintenance Tecentriq.  Repeat liver biopsy was also performed and was consistent with small cell lung cancer   Disease progression after 23 cycles of maintenance Tecentriq in the left supraclavicular lymph node region.  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  Plan to switch patient to second line lurbinectedin since November 2023    Interval history-she is doing well presently.  Denies any nausea vomiting diarrhea.  Denies any pain.  ECOG PS- 1 Pain scale- 0   Review of systems- Review of Systems  Constitutional:  Negative for chills, fever, malaise/fatigue and weight loss.  HENT:  Negative for congestion, ear discharge and nosebleeds.   Eyes:  Negative for blurred vision.  Respiratory:  Negative for cough, hemoptysis, sputum production, shortness of breath and wheezing.   Cardiovascular:  Negative for chest pain, palpitations, orthopnea and claudication.  Gastrointestinal:  Negative for abdominal pain, blood in stool, constipation, diarrhea, heartburn, melena, nausea and vomiting.  Genitourinary:  Negative for dysuria, flank pain, frequency, hematuria and urgency.  Musculoskeletal:  Negative for back pain, joint pain and myalgias.  Skin:  Negative for rash.  Neurological:  Negative for dizziness, tingling, focal weakness, seizures, weakness and headaches.  Endo/Heme/Allergies:  Does not bruise/bleed easily.  Psychiatric/Behavioral:  Negative for  depression and suicidal ideas. The patient does not have insomnia.       No Known Allergies   Past Medical History:  Diagnosis Date   Cancer (HCC)    Cataract    Diabetes mellitus  without complication (HCC)    Hyperlipidemia    Hypertension    Hypothyroidism    doctor's keeping an eye on thyroid    Lung cancer [facility])      Past Surgical History:  Procedure Laterality Date   ABDOMINAL HYSTERECTOMY     IR CV LINE INJECTION  09/28/2021   MYRINGOTOMY WITH TUBE PLACEMENT Right 12/29/2022   Procedure: MYRINGOTOMY WITH TUBE PLACEMENT;  Surgeon: Vernie Murders, MD;  Location: [name];  Service: ENT;  Laterality: Right;  Diabetic   PORTA CATH INSERTION N/A 11/30/2020   Procedure: PORTA CATH INSERTION;  Surgeon: Annice Needy, MD;  Location: [facility] INVASIVE CV LAB;  Service: Cardiovascular;  Laterality: N/A;   VIDEO BRONCHOSCOPY WITH ENDOBRONCHIAL NAVIGATION N/A 10/21/2020   Procedure: VIDEO BRONCHOSCOPY WITH ENDOBRONCHIAL NAVIGATION;  Surgeon: Vida Rigger, MD;  Location: [facility] ORS;  Service: Thoracic;  Laterality: N/A;   VIDEO BRONCHOSCOPY WITH ENDOBRONCHIAL ULTRASOUND N/A 10/21/2020   Procedure: VIDEO BRONCHOSCOPY WITH ENDOBRONCHIAL ULTRASOUND;  Surgeon: Vida Rigger, MD;  Location: [facility] ORS;  Service: Thoracic;  Laterality: N/A;    Social History   Socioeconomic History   Marital status: Single    Spouse name: Not on file   Number of children: Not on file   Years of education: Not on file   Highest education level: Not on file  Occupational History   Not on file  Tobacco Use   Smoking status: Never   Smokeless tobacco: Never  Vaping Use   Vaping status: Never Used  Substance and Sexual Activity   Alcohol use: No   Drug use: No   Sexual activity: Not Currently  Other Topics Concern   Not on file  Social History Narrative   Not on file   Social Determinants of Health   Financial Resource Strain: Low Risk  (03/22/2023)   Received [facility], [facility]   Overall Financial Resource Strain (CARDIA)    Difficulty of Paying Living Expenses: Not hard at all  Food Insecurity: No Food Insecurity (03/22/2023)    Received from [facility], [facility]   Hunger Vital Sign    Worried About Running Out of Food in the Last Year: Never true    Ran Out of Food in the Last Year: Never true  Transportation Needs: Unmet Transportation Needs (03/28/2023)   PRAPARE - Administrator, Civil Service (Medical): Yes    Lack of Transportation (Non-Medical): Yes  Physical Activity: Not on file  Stress: Not on file  Social Connections: Not on file  Intimate Partner Violence: Not on file    Family History  Problem Relation Age of Onset   Cancer Mother        breast   Hypertension Mother    Breast cancer Mother 42   Heart disease Father    Hyperlipidemia Brother    Heart disease Brother      Current Outpatient Medications:    apixaban (ELIQUIS) 5 MG TABS tablet, Take 1 tablet (5 mg total) by mouth 2 (two) times daily., Disp: 60 tablet, Rfl: 1   fluticasone (FLONASE) 50 MCG/ACT nasal spray, Place 2 sprays into both nostrils daily., Disp: 16 g, Rfl: 5   lidocaine-prilocaine (EMLA) cream,  Apply 1 Application topically as needed. Apply small amount to port site at least 1 hour prior to it being accessed, cover with plastic wrap, Disp: 30 g, Rfl: 1   losartan (COZAAR) 25 MG tablet, Take 1 tablet by mouth daily., Disp: , Rfl:    metFORMIN (GLUCOPHAGE-XR) 500 MG 24 hr tablet, Take by mouth., Disp: , Rfl:    potassium chloride SA (KLOR-CON M) 20 MEQ tablet, Take 1 tablet (20 mEq total) by mouth daily., Disp: 30 tablet, Rfl: 0 No current facility-administered medications for this visit.  Facility-Administered Medications Ordered in Other Visits:    sodium chloride flush (NS) 0.9 % injection 10 mL, 10 mL, Intravenous, PRN, Creig Hines, MD, 10 mL at 12/15/20 0850   sodium chloride flush (NS) 0.9 % injection 10 mL, 10 mL, Intravenous, PRN, Creig Hines, MD, 10 mL at 03/07/23 0923  Physical exam:  Vitals:   08/01/23 1003 08/01/23 1007  BP: (!) 172/86 (!) 142/98   Pulse: 60   Resp: 18   Temp: (!) 96.6 F (35.9 C)   TempSrc: Tympanic   SpO2: 100%   Weight: 169 lb 3.2 oz (76.7 kg)   Height: 5\' 6"  (1.676 m)    Physical Exam Cardiovascular:     Rate and Rhythm: Normal rate and regular rhythm.     Heart sounds: Normal heart sounds.  Pulmonary:     Effort: Pulmonary effort is normal.     Breath sounds: Normal breath sounds.  Abdominal:     General: Bowel sounds are normal.     Palpations: Abdomen is soft.  Lymphadenopathy:     Comments: Palpable left supraclavicular adenopathy unchanged  Skin:    General: Skin is warm and dry.  Neurological:     Mental Status: She is alert and oriented to person, place, and time.         Latest Ref Rng & Units 08/01/2023    9:47 AM  CMP  Glucose 70 - 99 mg/dL 91   BUN 8 - 23 mg/dL 14   Creatinine 4.09 - 1.00 mg/dL 8.11   Sodium 914 - 782 mmol/L 136   Potassium 3.5 - 5.1 mmol/L 3.4   Chloride 98 - 111 mmol/L 109   CO2 22 - 32 mmol/L 23   Calcium 8.9 - 10.3 mg/dL 9.2   Total Protein 6.5 - 8.1 g/dL 7.0   Total Bilirubin 0.3 - 1.2 mg/dL 0.6   Alkaline Phos 38 - 126 U/L 66   AST 15 - 41 U/L 14   ALT 0 - 44 U/L 12       Latest Ref Rng & Units 08/01/2023    9:47 AM  CBC  WBC 4.0 - 10.5 K/uL 2.5   Hemoglobin 12.0 - 15.0 g/dL 95.6   Hematocrit 21.3 - 46.0 % 33.7   Platelets 150 - 400 K/uL 285      Assessment and plan- Patient is a 72 y.o. female with extensive stage small cell lung cancer with lymph node and brain metastases here for on treatment assessment prior to cycle 17 of lurbinectedin  Counts okay to proceed with lurbinectedin cycle 17 today.  Neulasta in 2 days.  I will see her back in 3 weeks for cycle 18.  Plan to repeat scans in 6 weeks.  Chemo induced neutropenia: Overall stable and she will get Neulasta with each cycle.  Hypokalemia mild and chronic.  Continue oral potassium  History of renal venous thrombosis: Continue Eliquis   Visit Diagnosis 1.  Small cell carcinoma of lung  metastatic to liver (HCC)   2. Encounter for antineoplastic chemotherapy   3. Chemotherapy induced neutropenia (HCC)      Dr. Owens Shark, MD, MPH [facility] at [facility] [phone] 08/01/2023 10:28 AM

## 2023-08-03 ENCOUNTER — Inpatient Hospital Stay: Payer: 59

## 2023-08-03 DIAGNOSIS — Z5111 Encounter for antineoplastic chemotherapy: Secondary | ICD-10-CM | POA: Diagnosis not present

## 2023-08-03 DIAGNOSIS — C787 Secondary malignant neoplasm of liver and intrahepatic bile duct: Secondary | ICD-10-CM

## 2023-08-03 MED ORDER — PEGFILGRASTIM-CBQV 6 MG/0.6ML ~~LOC~~ SOSY
6.0000 mg | PREFILLED_SYRINGE | Freq: Once | SUBCUTANEOUS | Status: AC
  Administered 2023-08-03: 6 mg via SUBCUTANEOUS
  Filled 2023-08-03: qty 0.6

## 2023-08-21 MED FILL — Dexamethasone Sodium Phosphate Inj 100 MG/10ML: INTRAMUSCULAR | Qty: 1 | Status: AC

## 2023-08-22 ENCOUNTER — Inpatient Hospital Stay: Payer: 59 | Attending: Oncology

## 2023-08-22 ENCOUNTER — Inpatient Hospital Stay (HOSPITAL_BASED_OUTPATIENT_CLINIC_OR_DEPARTMENT_OTHER): Payer: 59 | Admitting: Oncology

## 2023-08-22 ENCOUNTER — Inpatient Hospital Stay: Payer: 59

## 2023-08-22 ENCOUNTER — Encounter: Payer: Self-pay | Admitting: Oncology

## 2023-08-22 VITALS — BP 119/69 | HR 68

## 2023-08-22 VITALS — BP 150/83 | HR 64 | Temp 97.4°F | Resp 18 | Ht 66.0 in | Wt 170.0 lb

## 2023-08-22 DIAGNOSIS — D709 Neutropenia, unspecified: Secondary | ICD-10-CM | POA: Diagnosis not present

## 2023-08-22 DIAGNOSIS — Z90710 Acquired absence of both cervix and uterus: Secondary | ICD-10-CM | POA: Insufficient documentation

## 2023-08-22 DIAGNOSIS — C787 Secondary malignant neoplasm of liver and intrahepatic bile duct: Secondary | ICD-10-CM

## 2023-08-22 DIAGNOSIS — C7931 Secondary malignant neoplasm of brain: Secondary | ICD-10-CM | POA: Insufficient documentation

## 2023-08-22 DIAGNOSIS — Z79899 Other long term (current) drug therapy: Secondary | ICD-10-CM | POA: Insufficient documentation

## 2023-08-22 DIAGNOSIS — D701 Agranulocytosis secondary to cancer chemotherapy: Secondary | ICD-10-CM

## 2023-08-22 DIAGNOSIS — E876 Hypokalemia: Secondary | ICD-10-CM | POA: Insufficient documentation

## 2023-08-22 DIAGNOSIS — E049 Nontoxic goiter, unspecified: Secondary | ICD-10-CM | POA: Insufficient documentation

## 2023-08-22 DIAGNOSIS — Z5111 Encounter for antineoplastic chemotherapy: Secondary | ICD-10-CM | POA: Diagnosis present

## 2023-08-22 DIAGNOSIS — Z7901 Long term (current) use of anticoagulants: Secondary | ICD-10-CM | POA: Diagnosis not present

## 2023-08-22 DIAGNOSIS — Z8349 Family history of other endocrine, nutritional and metabolic diseases: Secondary | ICD-10-CM | POA: Diagnosis not present

## 2023-08-22 DIAGNOSIS — Z87891 Personal history of nicotine dependence: Secondary | ICD-10-CM | POA: Diagnosis not present

## 2023-08-22 DIAGNOSIS — Z803 Family history of malignant neoplasm of breast: Secondary | ICD-10-CM | POA: Insufficient documentation

## 2023-08-22 DIAGNOSIS — T451X5A Adverse effect of antineoplastic and immunosuppressive drugs, initial encounter: Secondary | ICD-10-CM | POA: Diagnosis not present

## 2023-08-22 DIAGNOSIS — C3490 Malignant neoplasm of unspecified part of unspecified bronchus or lung: Secondary | ICD-10-CM

## 2023-08-22 DIAGNOSIS — Z86718 Personal history of other venous thrombosis and embolism: Secondary | ICD-10-CM | POA: Diagnosis not present

## 2023-08-22 DIAGNOSIS — C3412 Malignant neoplasm of upper lobe, left bronchus or lung: Secondary | ICD-10-CM | POA: Insufficient documentation

## 2023-08-22 DIAGNOSIS — Z5982 Transportation insecurity: Secondary | ICD-10-CM | POA: Insufficient documentation

## 2023-08-22 DIAGNOSIS — Z8249 Family history of ischemic heart disease and other diseases of the circulatory system: Secondary | ICD-10-CM | POA: Diagnosis not present

## 2023-08-22 DIAGNOSIS — C770 Secondary and unspecified malignant neoplasm of lymph nodes of head, face and neck: Secondary | ICD-10-CM | POA: Diagnosis not present

## 2023-08-22 DIAGNOSIS — E119 Type 2 diabetes mellitus without complications: Secondary | ICD-10-CM | POA: Insufficient documentation

## 2023-08-22 DIAGNOSIS — Z83438 Family history of other disorder of lipoprotein metabolism and other lipidemia: Secondary | ICD-10-CM | POA: Diagnosis not present

## 2023-08-22 DIAGNOSIS — Z79630 Long term (current) use of alkylating agent: Secondary | ICD-10-CM | POA: Diagnosis not present

## 2023-08-22 DIAGNOSIS — Z23 Encounter for immunization: Secondary | ICD-10-CM

## 2023-08-22 LAB — CBC WITH DIFFERENTIAL/PLATELET
Abs Immature Granulocytes: 0.01 10*3/uL (ref 0.00–0.07)
Basophils Absolute: 0 10*3/uL (ref 0.0–0.1)
Basophils Relative: 1 %
Eosinophils Absolute: 0.1 10*3/uL (ref 0.0–0.5)
Eosinophils Relative: 3 %
HCT: 34 % — ABNORMAL LOW (ref 36.0–46.0)
Hemoglobin: 11.1 g/dL — ABNORMAL LOW (ref 12.0–15.0)
Immature Granulocytes: 0 %
Lymphocytes Relative: 27 %
Lymphs Abs: 0.7 10*3/uL (ref 0.7–4.0)
MCH: 31 pg (ref 26.0–34.0)
MCHC: 32.6 g/dL (ref 30.0–36.0)
MCV: 95 fL (ref 80.0–100.0)
Monocytes Absolute: 0.4 10*3/uL (ref 0.1–1.0)
Monocytes Relative: 17 %
Neutro Abs: 1.3 10*3/uL — ABNORMAL LOW (ref 1.7–7.7)
Neutrophils Relative %: 52 %
Platelets: 272 10*3/uL (ref 150–400)
RBC: 3.58 MIL/uL — ABNORMAL LOW (ref 3.87–5.11)
RDW: 14.7 % (ref 11.5–15.5)
WBC: 2.5 10*3/uL — ABNORMAL LOW (ref 4.0–10.5)
nRBC: 0 % (ref 0.0–0.2)

## 2023-08-22 LAB — COMPREHENSIVE METABOLIC PANEL
ALT: 12 U/L (ref 0–44)
AST: 14 U/L — ABNORMAL LOW (ref 15–41)
Albumin: 3.7 g/dL (ref 3.5–5.0)
Alkaline Phosphatase: 63 U/L (ref 38–126)
Anion gap: 5 (ref 5–15)
BUN: 14 mg/dL (ref 8–23)
CO2: 22 mmol/L (ref 22–32)
Calcium: 9.1 mg/dL (ref 8.9–10.3)
Chloride: 109 mmol/L (ref 98–111)
Creatinine, Ser: 0.64 mg/dL (ref 0.44–1.00)
GFR, Estimated: >60 mL/min (ref >60)
Glucose, Bld: 86 mg/dL (ref 70–99)
Potassium: 3.6 mmol/L (ref 3.5–5.1)
Sodium: 136 mmol/L (ref 135–145)
Total Bilirubin: 0.6 mg/dL (ref 0.3–1.2)
Total Protein: 6.9 g/dL (ref 6.5–8.1)

## 2023-08-22 MED ORDER — PALONOSETRON HCL INJECTION 0.25 MG/5ML
0.2500 mg | Freq: Once | INTRAVENOUS | Status: AC
  Administered 2023-08-22: 0.25 mg via INTRAVENOUS
  Filled 2023-08-22: qty 5

## 2023-08-22 MED ORDER — SODIUM CHLORIDE 0.9% FLUSH
10.0000 mL | INTRAVENOUS | Status: DC | PRN
  Filled 2023-08-22: qty 10

## 2023-08-22 MED ORDER — HEPARIN SOD (PORK) LOCK FLUSH 100 UNIT/ML IV SOLN
500.0000 [IU] | Freq: Once | INTRAVENOUS | Status: DC | PRN
  Filled 2023-08-22: qty 5

## 2023-08-22 MED ORDER — INFLUENZA VAC A&B SURF ANT ADJ 0.5 ML IM SUSY: 0.5000 mL | PREFILLED_SYRINGE | Freq: Once | INTRAMUSCULAR | Status: DC

## 2023-08-22 MED ORDER — SODIUM CHLORIDE 0.9 % IV SOLN
Freq: Once | INTRAVENOUS | Status: AC
  Filled 2023-08-22: qty 250

## 2023-08-22 MED ORDER — SODIUM CHLORIDE 0.9 % IV SOLN
10.0000 mg | Freq: Once | INTRAVENOUS | Status: AC
  Administered 2023-08-22: 10 mg via INTRAVENOUS
  Filled 2023-08-22: qty 10

## 2023-08-22 MED ORDER — SODIUM CHLORIDE 0.9 % IV SOLN
2.5600 mg/m2 | Freq: Once | INTRAVENOUS | Status: AC
  Administered 2023-08-22: 4.6 mg via INTRAVENOUS
  Filled 2023-08-22: qty 9.2

## 2023-08-22 NOTE — Patient Instructions (Signed)
[place] [facility]  Discharge Instructions: Thank you for choosing [facility] to provide your oncology and hematology care.  If you have a lab appointment with the Cancer Center, please go directly to the Cancer Center and check in at the registration area.  Wear comfortable clothing and clothing appropriate for easy access to any Portacath or PICC line.   We strive to give you quality time with your provider. You may need to reschedule your appointment if you arrive late (15 or more minutes).  Arriving late affects you and other patients whose appointments are after yours.  Also, if you miss three or more appointments without notifying the office, you may be dismissed from the clinic at the provider's discretion.      For prescription refill requests, have your pharmacy contact our office and allow 72 hours for refills to be completed.    Today you received the following chemotherapy and/or immunotherapy agents [id number]      To help prevent nausea and vomiting after your treatment, we encourage you to take your nausea medication as directed.  BELOW ARE SYMPTOMS THAT SHOULD BE REPORTED IMMEDIATELY: *FEVER GREATER THAN 100.4 F (38 C) OR HIGHER *CHILLS OR SWEATING *NAUSEA AND VOMITING THAT IS NOT CONTROLLED WITH YOUR NAUSEA MEDICATION *UNUSUAL SHORTNESS OF BREATH *UNUSUAL BRUISING OR BLEEDING *URINARY PROBLEMS (pain or burning when urinating, or frequent urination) *BOWEL PROBLEMS (unusual diarrhea, constipation, pain near the anus) TENDERNESS IN MOUTH AND THROAT WITH OR WITHOUT PRESENCE OF ULCERS (sore throat, sores in mouth, or a toothache) UNUSUAL RASH, SWELLING OR PAIN  UNUSUAL VAGINAL DISCHARGE OR ITCHING   Items with * indicate a potential emergency and should be followed up as soon as possible or go to the Emergency Department if any problems should occur.  Please show the CHEMOTHERAPY ALERT CARD or IMMUNOTHERAPY ALERT CARD at check-in to  the Emergency Department and triage nurse.  Should you have questions after your visit or need to cancel or reschedule your appointment, please contact [place] [facility]  [phone] and follow the prompts.  Office hours are 8:00 a.m. to 4:30 p.m. Monday - Friday. Please note that voicemails left after 4:00 p.m. may not be returned until the following business day.  We are closed weekends and major holidays. You have access to a nurse at all times for urgent questions. Please call the main number to the clinic [phone] and follow the prompts.  For any non-urgent questions, you may also contact your provider using MyChart. We now offer e-Visits for anyone 58 and older to request care online for non-urgent symptoms. For details visit [email or web address].   Also download the MyChart app! Go to the app store, search "MyChart", open the app, select [place], and log in with your MyChart username and password.

## 2023-08-22 NOTE — Progress Notes (Signed)
Hematology/Oncology Consult note [facility]  [phone] [phone]  Patient Care Team: Mick Sell, MD as PCP - General (Infectious Diseases) Glory Buff, RN as Oncology Nurse Navigator Creig Hines, MD as Consulting Physician (Hematology and Oncology)   Name of the patient: Doris Johnson  [id number]  04/23/1951   Date of visit: 08/22/23  Diagnosis- extensive stage small cell lung cancer with liver lymph node and brain metastases   Chief complaint/ Reason for visit- on treatment assessment prior to cycle 17 of lurbinectedin  Heme/Onc history: Patient is a 72 year old female with a remote history of smoking in her teenage years and exposure to passive smoking.  She finally had a CT chest with contrast done in October 2021 which showed a large mass in the left side of the mediastinum measuring 6.7 x 6.2 x 6.1 cm causing severe compression of the left main pulmonary artery and around the aortic arch in the left subclavian artery.  Enlarged thyroid gland.  Left upper lobe nodule 1 x 0.7 cm.  She then underwent bronchoscopy with Dr.Aleskerov left upper lobe biopsy was nondiagnostic.  However station 4, 7 and 10 L lymph nodes were consistent with metastatic small cell carcinoma.   PET CT scan showed enlarged level 3 cervical lymph node 2.2 cm that was hypermetabolic with an SUV of 9.7.  Large central 7.3 cm AP window mass.  5.7 cm left liver mass.  MRI brain with and without contrast showed 2 small foci of enhancement in the right cerebellar hemisphere and right temporal lobe without mass-effect or edema as well concerning for brain metastases   Patient had 2 cycles of carbo etoposide chemotherapy along with Tecentriq and subsequent scan showed no significant improvement in the primary lung mass or liver mass.  She received radiation treatment to her primary lung mass and also seen by [facility] for second opinion.  Her pathology was reviewed  at [facility] and reported as possible neuroendocrine neoplasm But stated that small cell carcinoma cannot be excluded but not definitely diagnostic for it.  However [facility] pathology feels that this is consistent with small cell lung cancer.  She has completed 4 cycles of carbo etoposide Tecentriq chemotherapy and is currently on maintenance Tecentriq.  Repeat liver biopsy was also performed and was consistent with small cell lung cancer   Disease progression after 23 cycles of maintenance Tecentriq in the left supraclavicular lymph node region.  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  Plan to switch patient to second line lurbinectedin since November 2023    Interval history-no acute issues since last visit.  Denies any side effects from chemotherapy.  Appetite and weight have remained stable.  ECOG PS- 1 Pain scale- 0   Review of systems- Review of Systems  Constitutional:  Positive for malaise/fatigue. Negative for chills, fever and weight loss.  HENT:  Negative for congestion, ear discharge and nosebleeds.   Eyes:  Negative for blurred vision.  Respiratory:  Negative for cough, hemoptysis, sputum production, shortness of breath and wheezing.   Cardiovascular:  Negative for chest pain, palpitations, orthopnea and claudication.  Gastrointestinal:  Negative for abdominal pain, blood in stool, constipation, diarrhea, heartburn, melena, nausea and vomiting.  Genitourinary:  Negative for dysuria, flank pain, frequency, hematuria and urgency.  Musculoskeletal:  Negative for back pain, joint pain and myalgias.  Skin:  Negative for rash.  Neurological:  Negative for dizziness, tingling, focal weakness, seizures, weakness and headaches.  Endo/Heme/Allergies:  Does not  bruise/bleed easily.  Psychiatric/Behavioral:  Negative for depression and suicidal ideas. The patient does not have insomnia.       No Known Allergies   Past Medical History:  Diagnosis Date    Cancer (HCC)    Cataract    Diabetes mellitus without complication (HCC)    Hyperlipidemia    Hypertension    Hypothyroidism    doctor's keeping an eye on thyroid    Lung cancer [facility])      Past Surgical History:  Procedure Laterality Date   ABDOMINAL HYSTERECTOMY     IR CV LINE INJECTION  09/28/2021   MYRINGOTOMY WITH TUBE PLACEMENT Right 12/29/2022   Procedure: MYRINGOTOMY WITH TUBE PLACEMENT;  Surgeon: Vernie Murders, MD;  Location: [facility];  Service: ENT;  Laterality: Right;  Diabetic   PORTA CATH INSERTION N/A 11/30/2020   Procedure: PORTA CATH INSERTION;  Surgeon: Annice Needy, MD;  Location: [facility] INVASIVE CV LAB;  Service: Cardiovascular;  Laterality: N/A;   VIDEO BRONCHOSCOPY WITH ENDOBRONCHIAL NAVIGATION N/A 10/21/2020   Procedure: VIDEO BRONCHOSCOPY WITH ENDOBRONCHIAL NAVIGATION;  Surgeon: Vida Rigger, MD;  Location: [facility] ORS;  Service: Thoracic;  Laterality: N/A;   VIDEO BRONCHOSCOPY WITH ENDOBRONCHIAL ULTRASOUND N/A 10/21/2020   Procedure: VIDEO BRONCHOSCOPY WITH ENDOBRONCHIAL ULTRASOUND;  Surgeon: Vida Rigger, MD;  Location: [facility] ORS;  Service: Thoracic;  Laterality: N/A;    Social History   Socioeconomic History   Marital status: Single    Spouse name: Not on file   Number of children: Not on file   Years of education: Not on file   Highest education level: Not on file  Occupational History   Not on file  Tobacco Use   Smoking status: Never   Smokeless tobacco: Never  Vaping Use   Vaping status: Never Used  Substance and Sexual Activity   Alcohol use: No   Drug use: No   Sexual activity: Not Currently  Other Topics Concern   Not on file  Social History Narrative   Not on file   Social Determinants of Health   Financial Resource Strain: Low Risk  (03/22/2023)   [facility], [facility]   Overall Financial Resource Strain (CARDIA)    Difficulty of Paying Living Expenses: Not hard at all   Food Insecurity: No Food Insecurity (03/22/2023)   Received from [facility], [facility]   Hunger Vital Sign    Worried About Running Out of Food in the Last Year: Never true    Ran Out of Food in the Last Year: Never true  Transportation Needs: Unmet Transportation Needs (03/28/2023)   PRAPARE - Administrator, Civil Service (Medical): Yes    Lack of Transportation (Non-Medical): Yes  Physical Activity: Not on file  Stress: Not on file  Social Connections: Not on file  Intimate Partner Violence: Not on file    Family History  Problem Relation Age of Onset   Cancer Mother        breast   Hypertension Mother    Breast cancer Mother 10   Heart disease Father    Hyperlipidemia Brother    Heart disease Brother      Current Outpatient Medications:    apixaban (ELIQUIS) 5 MG TABS tablet, Take 1 tablet (5 mg total) by mouth 2 (two) times daily., Disp: 60 tablet, Rfl: 1   fluticasone (FLONASE) 50 MCG/ACT nasal spray, Place 2 sprays into both nostrils daily., Disp: 16 g,  Rfl: 5   lidocaine-prilocaine (EMLA) cream, Apply 1 Application topically as needed. Apply small amount to port site at least 1 hour prior to it being accessed, cover with plastic wrap, Disp: 30 g, Rfl: 1   losartan (COZAAR) 25 MG tablet, Take 1 tablet by mouth daily., Disp: , Rfl:    metFORMIN (GLUCOPHAGE-XR) 500 MG 24 hr tablet, Take by mouth., Disp: , Rfl:    potassium chloride SA (KLOR-CON M) 20 MEQ tablet, Take 1 tablet (20 mEq total) by mouth daily., Disp: 30 tablet, Rfl: 0 No current facility-administered medications for this visit.  Facility-Administered Medications Ordered in Other Visits:    heparin lock flush 100 unit/mL, 500 Units, Intracatheter, Once PRN, Creig Hines, MD   sodium chloride flush (NS) 0.9 % injection 10 mL, 10 mL, Intravenous, PRN, Creig Hines, MD, 10 mL at 12/15/20 0850   sodium chloride flush (NS) 0.9 % injection 10 mL, 10 mL,  Intravenous, PRN, Creig Hines, MD, 10 mL at 03/07/23 0923   sodium chloride flush (NS) 0.9 % injection 10 mL, 10 mL, Intracatheter, PRN, Creig Hines, MD  Physical exam:  Vitals:   08/22/23 0938  BP: (!) 150/83  Pulse: 64  Resp: 18  Temp: (!) 97.4 F (36.3 C)  TempSrc: Tympanic  SpO2: 100%  Weight: 170 lb (77.1 kg)  Height: 5\' 6"  (1.676 m)   Physical Exam Cardiovascular:     Rate and Rhythm: Normal rate and regular rhythm.     Heart sounds: Normal heart sounds.  Pulmonary:     Effort: Pulmonary effort is normal.     Breath sounds: Normal breath sounds.  Skin:    General: Skin is warm and dry.  Neurological:     Mental Status: She is alert and oriented to person, place, and time.         Latest Ref Rng & Units 08/22/2023    9:02 AM  CMP  Glucose 70 - 99 mg/dL 86   BUN 8 - 23 mg/dL 14   Creatinine 4.09 - 1.00 mg/dL 8.11   Sodium 914 - 782 mmol/L 136   Potassium 3.5 - 5.1 mmol/L 3.6   Chloride 98 - 111 mmol/L 109   CO2 22 - 32 mmol/L 22   Calcium 8.9 - 10.3 mg/dL 9.1   Total Protein 6.5 - 8.1 g/dL 6.9   Total Bilirubin 0.3 - 1.2 mg/dL 0.6   Alkaline Phos 38 - 126 U/L 63   AST 15 - 41 U/L 14   ALT 0 - 44 U/L 12       Latest Ref Rng & Units 08/22/2023    9:02 AM  CBC  WBC 4.0 - 10.5 K/uL 2.5   Hemoglobin 12.0 - 15.0 g/dL 95.6   Hematocrit 21.3 - 46.0 % 34.0   Platelets 150 - 400 K/uL 272      Assessment and plan- Patient is a 72 y.o. female with extensive stage small cell lung cancer with lymph node and brain metastases here for on treatment assessment prior to cycle 18 of lurbinectedin  Patient has chronic leukopenia/neutropenia on lurbinectedin which has remained stable and she gets growth factor support with each cycle of treatment.  Counts okay to proceed with cycle 18 of lurbinectedin today and I will see her back in 3 weeks for cycle 19.  PlanAnd is to continue this until progression or toxicity.  I will plan to repeat scans in November.  History of  renal vein thrombosis: Continue Eliquis  Hypokalemia: Continue oral potassium   Visit Diagnosis 1. Small cell carcinoma of lung metastatic to liver (HCC)   2. Encounter for antineoplastic chemotherapy   3. Chemotherapy induced neutropenia (HCC)   4. Current use of long term anticoagulation      Dr. Owens Shark, MD, MPH [facility] at [facility] [phone] 08/22/2023 3:51 PM

## 2023-08-24 ENCOUNTER — Inpatient Hospital Stay: Payer: 59

## 2023-08-24 DIAGNOSIS — Z5111 Encounter for antineoplastic chemotherapy: Secondary | ICD-10-CM | POA: Diagnosis not present

## 2023-08-24 DIAGNOSIS — C3490 Malignant neoplasm of unspecified part of unspecified bronchus or lung: Secondary | ICD-10-CM

## 2023-08-24 MED ORDER — PEGFILGRASTIM-CBQV 6 MG/0.6ML ~~LOC~~ SOSY
6.0000 mg | PREFILLED_SYRINGE | Freq: Once | SUBCUTANEOUS | Status: AC
  Administered 2023-08-24: 6 mg via SUBCUTANEOUS
  Filled 2023-08-24: qty 0.6

## 2023-09-12 ENCOUNTER — Other Ambulatory Visit: Payer: Self-pay | Admitting: Oncology

## 2023-09-12 ENCOUNTER — Inpatient Hospital Stay: Payer: 59

## 2023-09-12 ENCOUNTER — Inpatient Hospital Stay (HOSPITAL_BASED_OUTPATIENT_CLINIC_OR_DEPARTMENT_OTHER): Payer: 59 | Admitting: Oncology

## 2023-09-12 ENCOUNTER — Encounter: Payer: Self-pay | Admitting: Oncology

## 2023-09-12 VITALS — BP 170/80 | HR 59 | Temp 96.3°F | Resp 17 | Ht 65.0 in | Wt 171.6 lb

## 2023-09-12 DIAGNOSIS — C787 Secondary malignant neoplasm of liver and intrahepatic bile duct: Secondary | ICD-10-CM

## 2023-09-12 DIAGNOSIS — Z7901 Long term (current) use of anticoagulants: Secondary | ICD-10-CM

## 2023-09-12 DIAGNOSIS — T451X5A Adverse effect of antineoplastic and immunosuppressive drugs, initial encounter: Secondary | ICD-10-CM

## 2023-09-12 DIAGNOSIS — D701 Agranulocytosis secondary to cancer chemotherapy: Secondary | ICD-10-CM | POA: Diagnosis not present

## 2023-09-12 DIAGNOSIS — C3490 Malignant neoplasm of unspecified part of unspecified bronchus or lung: Secondary | ICD-10-CM | POA: Diagnosis not present

## 2023-09-12 DIAGNOSIS — Z5111 Encounter for antineoplastic chemotherapy: Secondary | ICD-10-CM | POA: Diagnosis not present

## 2023-09-12 DIAGNOSIS — E876 Hypokalemia: Secondary | ICD-10-CM | POA: Diagnosis not present

## 2023-09-12 LAB — CBC WITH DIFFERENTIAL/PLATELET
Abs Immature Granulocytes: 0 10*3/uL (ref 0.00–0.07)
Basophils Absolute: 0 10*3/uL (ref 0.0–0.1)
Basophils Relative: 1 %
Eosinophils Absolute: 0.1 10*3/uL (ref 0.0–0.5)
Eosinophils Relative: 2 %
HCT: 35.3 % — ABNORMAL LOW (ref 36.0–46.0)
Hemoglobin: 11.4 g/dL — ABNORMAL LOW (ref 12.0–15.0)
Immature Granulocytes: 0 %
Lymphocytes Relative: 33 %
Lymphs Abs: 0.7 10*3/uL (ref 0.7–4.0)
MCH: 31.1 pg (ref 26.0–34.0)
MCHC: 32.3 g/dL (ref 30.0–36.0)
MCV: 96.2 fL (ref 80.0–100.0)
Monocytes Absolute: 0.3 10*3/uL (ref 0.1–1.0)
Monocytes Relative: 16 %
Neutro Abs: 1 10*3/uL — ABNORMAL LOW (ref 1.7–7.7)
Neutrophils Relative %: 48 %
Platelets: 252 10*3/uL (ref 150–400)
RBC: 3.67 MIL/uL — ABNORMAL LOW (ref 3.87–5.11)
RDW: 14.6 % (ref 11.5–15.5)
WBC: 2.1 10*3/uL — ABNORMAL LOW (ref 4.0–10.5)
nRBC: 0 % (ref 0.0–0.2)

## 2023-09-12 LAB — COMPREHENSIVE METABOLIC PANEL
ALT: 11 U/L (ref 0–44)
AST: 16 U/L (ref 15–41)
Albumin: 4 g/dL (ref 3.5–5.0)
Alkaline Phosphatase: 75 U/L (ref 38–126)
Anion gap: 6 (ref 5–15)
BUN: 17 mg/dL (ref 8–23)
CO2: 24 mmol/L (ref 22–32)
Calcium: 9.3 mg/dL (ref 8.9–10.3)
Chloride: 111 mmol/L (ref 98–111)
Creatinine, Ser: 0.65 mg/dL (ref 0.44–1.00)
GFR, Estimated: >60 mL/min (ref >60)
Glucose, Bld: 111 mg/dL — ABNORMAL HIGH (ref 70–99)
Potassium: 3.2 mmol/L — ABNORMAL LOW (ref 3.5–5.1)
Sodium: 141 mmol/L (ref 135–145)
Total Bilirubin: 0.5 mg/dL (ref 0.3–1.2)
Total Protein: 7.2 g/dL (ref 6.5–8.1)

## 2023-09-12 MED ORDER — PALONOSETRON HCL INJECTION 0.25 MG/5ML
0.2500 mg | Freq: Once | INTRAVENOUS | Status: AC
  Administered 2023-09-12: 0.25 mg via INTRAVENOUS
  Filled 2023-09-12: qty 5

## 2023-09-12 MED ORDER — PEGFILGRASTIM 6 MG/0.6ML ~~LOC~~ PSKT
6.0000 mg | PREFILLED_SYRINGE | Freq: Once | SUBCUTANEOUS | Status: AC
Start: 2023-09-12 — End: 2023-09-12
  Administered 2023-09-12: 6 mg via SUBCUTANEOUS
  Filled 2023-09-12: qty 0.6

## 2023-09-12 MED ORDER — SODIUM CHLORIDE 0.9 % IV SOLN
Freq: Once | INTRAVENOUS | Status: AC
  Filled 2023-09-12: qty 250

## 2023-09-12 MED ORDER — DEXAMETHASONE SODIUM PHOSPHATE 10 MG/ML IJ SOLN
10.0000 mg | Freq: Once | INTRAMUSCULAR | Status: AC
  Administered 2023-09-12: 10 mg via INTRAVENOUS
  Filled 2023-09-12: qty 1

## 2023-09-12 MED ORDER — POTASSIUM CHLORIDE CRYS ER 20 MEQ PO TBCR: 20.0000 meq | EXTENDED_RELEASE_TABLET | Freq: Every day | ORAL | 0 refills | Status: DC

## 2023-09-12 MED ORDER — HEPARIN SOD (PORK) LOCK FLUSH 100 UNIT/ML IV SOLN
500.0000 [IU] | Freq: Once | INTRAVENOUS | Status: AC | PRN
  Administered 2023-09-12: 500 [IU]
  Filled 2023-09-12: qty 5

## 2023-09-12 MED ORDER — LURBINECTEDIN CHEMO IV INJECTION 4 MG
2.5600 mg/m2 | Freq: Once | INTRAVENOUS | Status: AC
  Administered 2023-09-12: 4.6 mg via INTRAVENOUS
  Filled 2023-09-12: qty 9.2

## 2023-09-12 NOTE — Patient Instructions (Signed)
[place] [facility]  Discharge Instructions: Thank you for choosing [facility] to provide your oncology and hematology care.  If you have a lab appointment with the Cancer Center, please go directly to the Cancer Center and check in at the registration area.  Wear comfortable clothing and clothing appropriate for easy access to any Portacath or PICC line.   We strive to give you quality time with your provider. You may need to reschedule your appointment if you arrive late (15 or more minutes).  Arriving late affects you and other patients whose appointments are after yours.  Also, if you miss three or more appointments without notifying the office, you may be dismissed from the clinic at the provider's discretion.      For prescription refill requests, have your pharmacy contact our office and allow 72 hours for refills to be completed.    Today you received the following chemotherapy and/or immunotherapy agents ZEPZELCA      To help prevent nausea and vomiting after your treatment, we encourage you to take your nausea medication as directed.  BELOW ARE SYMPTOMS THAT SHOULD BE REPORTED IMMEDIATELY: *FEVER GREATER THAN 100.4 F (38 C) OR HIGHER *CHILLS OR SWEATING *NAUSEA AND VOMITING THAT IS NOT CONTROLLED WITH YOUR NAUSEA MEDICATION *UNUSUAL SHORTNESS OF BREATH *UNUSUAL BRUISING OR BLEEDING *URINARY PROBLEMS (pain or burning when urinating, or frequent urination) *BOWEL PROBLEMS (unusual diarrhea, constipation, pain near the anus) TENDERNESS IN MOUTH AND THROAT WITH OR WITHOUT PRESENCE OF ULCERS (sore throat, sores in mouth, or a toothache) UNUSUAL RASH, SWELLING OR PAIN  UNUSUAL VAGINAL DISCHARGE OR ITCHING   Items with * indicate a potential emergency and should be followed up as soon as possible or go to the Emergency Department if any problems should occur.  Please show the CHEMOTHERAPY ALERT CARD or IMMUNOTHERAPY ALERT CARD at check-in to  the Emergency Department and triage nurse.  Should you have questions after your visit or need to cancel or reschedule your appointment, please contact [place] [facility]  [phone] and follow the prompts.  Office hours are 8:00 a.m. to 4:30 p.m. Monday - Friday. Please note that voicemails left after 4:00 p.m. may not be returned until the following business day.  We are closed weekends and major holidays. You have access to a nurse at all times for urgent questions. Please call the main number to the clinic [phone] and follow the prompts.  For any non-urgent questions, you may also contact your provider using MyChart. We now offer e-Visits for anyone 29 and older to request care online for non-urgent symptoms. For details visit [email or web address].   Also download the MyChart app! Go to the app store, search "MyChart", open the app, select [place], and log in with your MyChart username and password.  Lurbinectedin Injection What is this medication? LURBINECTEDIN (LOOR bin EK te din) treats lung cancer. It works by slowing down the growth of cancer cells. This medicine may be used for other purposes; ask your health care provider or pharmacist if you have questions. COMMON BRAND NAME(S): ZEPZELCA What should I tell my care team before I take this medication? They need to know if you have any of these conditions: Liver disease Low blood cell levels, such as low white cells, platelets, red blood cells An unusual or allergic reaction to lurbinectedin, other medications, foods, dyes, or preservatives If you or your partner are pregnant or trying to get pregnant Breastfeeding How should  I use this medication? This medication is injected into a vein. It is given by your care team in a hospital or clinic setting. Talk to your care team about the use of this medication in children. Special care may be needed. Overdosage: If you think you have taken too much  of this medicine contact a poison control center or emergency room at once. NOTE: This medicine is only for you. Do not share this medicine with others. What if I miss a dose? Keep appointments for follow-up doses. It is important not to miss your dose. Call your care team if you are unable to keep an appointment. What may interact with this medication? Grapefruit juice or [place] oranges Other medications may affect the way this medication works. Talk with your care team about all of the medications you take. They may suggest changes to your treatment plan to lower the risk of side effects and to make sure your medications work as intended. This list may not describe all possible interactions. Give your health care provider a list of all the medicines, herbs, non-prescription drugs, or dietary supplements you use. Also tell them if you smoke, drink alcohol, or use illegal drugs. Some items may interact with your medicine. What should I watch for while using this medication? Your condition will be monitored carefully while you are receiving this medication. This medication may make you feel generally unwell. This is not uncommon as chemotherapy can affect healthy cells as well as cancer cells. Report any side effects. Continue your course of treatment even though you feel ill unless your care team tells you to stop. This medication may increase your risk of getting an infection. Call your care team for advice if you get a fever, chills, sore throat, or other symptoms of a cold or flu. Do not treat yourself. Try to avoid being around people who are sick. Avoid taking medications that contain aspirin, acetaminophen, ibuprofen, naproxen, or ketoprofen unless instructed by your care team. These medications may hide a fever. Be careful brushing or flossing your teeth or using a toothpick because you may get an infection or bleed more easily. If you have any dental work done, tell your dentist you are  receiving this medication. Talk to your care team if you may be pregnant. Serious birth defects can occur if you take this medication during pregnancy and for 6 months after the last dose. You will need a negative pregnancy test before starting this medication. Contraception is recommended while taking this medication and for 6 months after the last dose. If your partner can get pregnant, use a condom during sex while taking this medication and for 4 months after the last dose. Do not breastfeed while taking this medication and for 2 weeks after the last dose. What side effects may I notice from receiving this medication? Side effects that you should report to your care team as soon as possible: Allergic reactions--skin rash, itching, hives, swelling of the face, lips, tongue, or throat Infection--fever, chills, cough, sore throat, wounds that don't heal, pain or trouble when passing urine, general feeling of discomfort or being unwell Liver injury--right upper belly pain, loss of appetite, nausea, light-colored stool, dark yellow or brown urine, yellowing skin or eyes, unusual weakness or fatigue Low red blood cell level--unusual weakness or fatigue, dizziness, headache, trouble breathing Muscle injury--unusual weakness or fatigue, muscle pain, dark yellow or brown urine, decrease in amount of urine Painful swelling, warmth, or redness of the skin, blisters or sores  at the infusion site Unusual bruising or bleeding Side effects that usually do not require medical attention (report these to your care team if they continue or are bothersome): Constipation Cough Diarrhea Fatigue Loss of appetite Nausea This list may not describe all possible side effects. Call your doctor for medical advice about side effects. You may report side effects to FDA at 1-800-FDA-1088. Where should I keep my medication? This medication is given in a hospital or clinic. It will not be stored at home. NOTE: This sheet  is a summary. It may not cover all possible information. If you have questions about this medicine, talk to your doctor, pharmacist, or health care provider.  2024 Elsevier/Gold Standard (2022-06-14 00:00:00)

## 2023-09-12 NOTE — Progress Notes (Signed)
OK to switch pt to Neulasta OnPro per Dr. Smith Robert.  Tx plan updated.  Ebony Hail, Pharm.D., CPP 09/12/2023@10 :16 AM

## 2023-09-12 NOTE — Telephone Encounter (Signed)
Component Ref Range & Units 09:12 (09/12/23) 3 wk ago (08/22/23) 1 mo ago (08/01/23) 2 mo ago (07/11/23) 2 mo ago (06/20/23) 3 mo ago (05/30/23) 4 mo ago (05/09/23)  Potassium 3.5 - 5.1 mmol/L 3.2 Low  3.6 3.4 Low  3.7 3.0 Low  3.2 Low  3.4 Low

## 2023-09-12 NOTE — Progress Notes (Signed)
Hematology/Oncology Consult note [facility]  Telephone:([phone] Fax:([phone]  Patient Care Team: Mick Sell, MD as PCP - General (Infectious Diseases) Glory Buff, RN as Oncology Nurse Navigator Creig Hines, MD as Consulting Physician (Hematology and Oncology)   Name of the patient: Doris Johnson  [id number]  [birth year]   Date of visit: 09/12/23  Diagnosis- extensive stage small cell lung cancer with liver lymph node and brain metastases   Chief complaint/ Reason for visit-on treatment assessment prior to cycle 19 of lurbinectedin  Heme/Onc history: Patient is a 72 year old female with a remote history of smoking in her teenage years and exposure to passive smoking.  She finally had a CT chest with contrast done in October 2021 which showed a large mass in the left side of the mediastinum measuring 6.7 x 6.2 x 6.1 cm causing severe compression of the left main pulmonary artery and around the aortic arch in the left subclavian artery.  Enlarged thyroid gland.  Left upper lobe nodule 1 x 0.7 cm.  She then underwent bronchoscopy with Dr.Aleskerov left upper lobe biopsy was nondiagnostic.  However station 4, 7 and 10 L lymph nodes were consistent with metastatic small cell carcinoma.   PET CT scan showed enlarged level 3 cervical lymph node 2.2 cm that was hypermetabolic with an SUV of 9.7.  Large central 7.3 cm AP window mass.  5.7 cm left liver mass.  MRI brain with and without contrast showed 2 small foci of enhancement in the right cerebellar hemisphere and right temporal lobe without mass-effect or edema as well concerning for brain metastases   Patient had 2 cycles of carbo etoposide chemotherapy along with Tecentriq and subsequent scan showed no significant improvement in the primary lung mass or liver mass.  She received radiation treatment to her primary lung mass and also seen by [facility] for second opinion.  Her pathology was reviewed at  [facility] and reported as possible neuroendocrine neoplasm But stated that small cell carcinoma cannot be excluded but not definitely diagnostic for it.  However [facility] pathology feels that this is consistent with small cell lung cancer.  She has completed 4 cycles of carbo etoposide Tecentriq chemotherapy and is currently on maintenance Tecentriq.  Repeat liver biopsy was also performed and was consistent with small cell lung cancer   Disease progression after 23 cycles of maintenance Tecentriq in the left supraclavicular lymph node region.  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  Plan to switch patient to second line lurbinectedin since November 2023      Interval history-no acute issues since last visit.  She did not take her blood pressure medications this morning and therefore her blood pressure is high in the office.  ECOG PS- 1 Pain scale- 0   Review of systems- Review of Systems  Constitutional:  Positive for malaise/fatigue. Negative for chills, fever and weight loss.  HENT:  Negative for congestion, ear discharge and nosebleeds.   Eyes:  Negative for blurred vision.  Respiratory:  Negative for cough, hemoptysis, sputum production, shortness of breath and wheezing.   Cardiovascular:  Negative for chest pain, palpitations, orthopnea and claudication.  Gastrointestinal:  Negative for abdominal pain, blood in stool, constipation, diarrhea, heartburn, melena, nausea and vomiting.  Genitourinary:  Negative for dysuria, flank pain, frequency, hematuria and urgency.  Musculoskeletal:  Negative for back pain, joint pain and myalgias.  Skin:  Negative for rash.  Neurological:  Negative for dizziness, tingling, focal weakness, seizures,  weakness and headaches.  Endo/Heme/Allergies:  Does not bruise/bleed easily.  Psychiatric/Behavioral:  Negative for depression and suicidal ideas. The patient does not have insomnia.       No Known Allergies   Past  Medical History:  Diagnosis Date   Cancer (HCC)    Cataract    Diabetes mellitus without complication (HCC)    Hyperlipidemia    Hypertension    Hypothyroidism    doctor's keeping an eye on thyroid    Lung cancer [facility])      Past Surgical History:  Procedure Laterality Date   ABDOMINAL HYSTERECTOMY     IR CV LINE INJECTION  09/28/2021   MYRINGOTOMY WITH TUBE PLACEMENT Right 12/29/2022   Procedure: MYRINGOTOMY WITH TUBE PLACEMENT;  Surgeon: Vernie Murders, MD;  Location: [facility];  Service: ENT;  Laterality: Right;  Diabetic   PORTA CATH INSERTION N/A 11/30/2020   Procedure: PORTA CATH INSERTION;  Surgeon: Annice Needy, MD;  Location: [facility] INVASIVE CV LAB;  Service: Cardiovascular;  Laterality: N/A;   VIDEO BRONCHOSCOPY WITH ENDOBRONCHIAL NAVIGATION N/A 10/21/2020   Procedure: VIDEO BRONCHOSCOPY WITH ENDOBRONCHIAL NAVIGATION;  Surgeon: Vida Rigger, MD;  Location: [facility] ORS;  Service: Thoracic;  Laterality: N/A;   VIDEO BRONCHOSCOPY WITH ENDOBRONCHIAL ULTRASOUND N/A 10/21/2020   Procedure: VIDEO BRONCHOSCOPY WITH ENDOBRONCHIAL ULTRASOUND;  Surgeon: Vida Rigger, MD;  Location: [facility] ORS;  Service: Thoracic;  Laterality: N/A;    Social History   Socioeconomic History   Marital status: Single    Spouse name: Not on file   Number of children: Not on file   Years of education: Not on file   Highest education level: Not on file  Occupational History   Not on file  Tobacco Use   Smoking status: Never   Smokeless tobacco: Never  Vaping Use   Vaping status: Never Used  Substance and Sexual Activity   Alcohol use: No   Drug use: No   Sexual activity: Not Currently  Other Topics Concern   Not on file  Social History Narrative   Not on file   Social Determinants of Health   Financial Resource Strain: Low Risk  (03/22/2023)   Received from [facility], [facility]   Overall Financial Resource Strain (CARDIA)    Difficulty of Paying  Living Expenses: Not hard at all  Food Insecurity: No Food Insecurity (03/22/2023)   Received from [facility], [facility]   Hunger Vital Sign    Worried About Running Out of Food in the Last Year: Never true    Ran Out of Food in the Last Year: Never true  Transportation Needs: Unmet Transportation Needs (03/28/2023)   PRAPARE - Administrator, Civil Service (Medical): Yes    Lack of Transportation (Non-Medical): Yes  Physical Activity: Not on file  Stress: Not on file  Social Connections: Not on file  Intimate Partner Violence: Not on file    Family History  Problem Relation Age of Onset   Cancer Mother        breast   Hypertension Mother    Breast cancer Mother 82   Heart disease Father    Hyperlipidemia Brother    Heart disease Brother      Current Outpatient Medications:    apixaban (ELIQUIS) 5 MG TABS tablet, Take 1 tablet (5 mg total) by mouth 2 (two) times daily., Disp: 60 tablet, Rfl: 1   fluticasone (FLONASE) 50 MCG/ACT nasal spray, Place 2  sprays into both nostrils daily., Disp: 16 g, Rfl: 5   lidocaine-prilocaine (EMLA) cream, Apply 1 Application topically as needed. Apply small amount to port site at least 1 hour prior to it being accessed, cover with plastic wrap, Disp: 30 g, Rfl: 1   losartan (COZAAR) 25 MG tablet, Take 1 tablet by mouth daily., Disp: , Rfl:    metFORMIN (GLUCOPHAGE-XR) 500 MG 24 hr tablet, Take by mouth., Disp: , Rfl:    potassium chloride SA (KLOR-CON M) 20 MEQ tablet, Take 1 tablet (20 mEq total) by mouth daily., Disp: 30 tablet, Rfl: 0 No current facility-administered medications for this visit.  Facility-Administered Medications Ordered in Other Visits:    sodium chloride flush (NS) 0.9 % injection 10 mL, 10 mL, Intravenous, PRN, Creig Hines, MD, 10 mL at 12/15/20 0850   sodium chloride flush (NS) 0.9 % injection 10 mL, 10 mL, Intravenous, PRN, Creig Hines, MD, 10 mL at 03/07/23  0923  Physical exam:  Vitals:   09/12/23 0942 09/12/23 0945  BP: (!) 164/77 (!) 170/80  Pulse: (!) 59   Resp: 17   Temp: (!) 96.3 F (35.7 C)   TempSrc: Tympanic   SpO2: 100%   Weight: 171 lb 9.6 oz (77.8 kg)   Height: 5\' 5"  (1.651 m)    Physical Exam Neck:     Comments: Palpable left supraclavicular adenopathy appears stable Cardiovascular:     Rate and Rhythm: Normal rate and regular rhythm.     Heart sounds: Normal heart sounds.  Pulmonary:     Effort: Pulmonary effort is normal.     Breath sounds: Normal breath sounds.  Musculoskeletal:     Right lower leg: No edema.     Left lower leg: No edema.  Skin:    General: Skin is warm and dry.  Neurological:     Mental Status: She is alert and oriented to person, place, and time.         Latest Ref Rng & Units 09/12/2023    9:12 AM  CMP  Glucose 70 - 99 mg/dL 865   BUN 8 - 23 mg/dL 17   Creatinine 7.84 - 1.00 mg/dL 6.96   Sodium 295 - 284 mmol/L 141   Potassium 3.5 - 5.1 mmol/L 3.2   Chloride 98 - 111 mmol/L 111   CO2 22 - 32 mmol/L 24   Calcium 8.9 - 10.3 mg/dL 9.3   Total Protein 6.5 - 8.1 g/dL 7.2   Total Bilirubin 0.3 - 1.2 mg/dL 0.5   Alkaline Phos 38 - 126 U/L 75   AST 15 - 41 U/L 16   ALT 0 - 44 U/L 11       Latest Ref Rng & Units 09/12/2023    9:12 AM  CBC  WBC 4.0 - 10.5 K/uL 2.1   Hemoglobin 12.0 - 15.0 g/dL 13.2   Hematocrit 44.0 - 46.0 % 35.3   Platelets 150 - 400 K/uL 252      Assessment and plan- Patient is a 72 y.o. female with extensive stage small cell lung cancer with lymph node and brain metastases.  She is here for on treatment assessment prior to cycle 19 of lurbinectedin  Counts okay to proceed with cycle 19 of lurbinectedin today.  White count is 2.1 with an ANC of 1.  She will be receiving Neulasta with every cycle.  As long as ANC is greater than or equal to 1 okay to go ahead with treatment.  Hemoglobin and  platelets are stable.  Plans to repeat scans in about 4 to 5  weeks  Hypokalemia: She will continue outpatient oral potassium 20 mEq daily.  Hypertension: She will take her blood pressure medications today and I have advised her to take all her medications before she comes for her treatments  History of renal vein thrombosis: Continue Eliquis    Visit Diagnosis 1. Small cell carcinoma of lung metastatic to liver (HCC)   2. Chemotherapy induced neutropenia (HCC)   3. Hypokalemia   4. Current use of long term anticoagulation      Dr. Owens Shark, MD, MPH [facility] [phone] 09/12/2023 10:05 AM

## 2023-09-14 ENCOUNTER — Inpatient Hospital Stay: Payer: 59

## 2023-09-15 ENCOUNTER — Inpatient Hospital Stay: Payer: 59

## 2023-10-03 ENCOUNTER — Inpatient Hospital Stay: Payer: 59

## 2023-10-03 ENCOUNTER — Inpatient Hospital Stay (HOSPITAL_BASED_OUTPATIENT_CLINIC_OR_DEPARTMENT_OTHER): Payer: 59 | Admitting: Oncology

## 2023-10-03 ENCOUNTER — Encounter: Payer: Self-pay | Admitting: Oncology

## 2023-10-03 ENCOUNTER — Inpatient Hospital Stay: Payer: 59 | Attending: Oncology

## 2023-10-03 VITALS — BP 160/86 | HR 69 | Temp 98.2°F | Resp 18 | Wt 171.0 lb

## 2023-10-03 DIAGNOSIS — Z7901 Long term (current) use of anticoagulants: Secondary | ICD-10-CM | POA: Insufficient documentation

## 2023-10-03 DIAGNOSIS — C7951 Secondary malignant neoplasm of bone: Secondary | ICD-10-CM | POA: Diagnosis not present

## 2023-10-03 DIAGNOSIS — E876 Hypokalemia: Secondary | ICD-10-CM

## 2023-10-03 DIAGNOSIS — E049 Nontoxic goiter, unspecified: Secondary | ICD-10-CM | POA: Diagnosis not present

## 2023-10-03 DIAGNOSIS — C3412 Malignant neoplasm of upper lobe, left bronchus or lung: Secondary | ICD-10-CM | POA: Diagnosis present

## 2023-10-03 DIAGNOSIS — C3490 Malignant neoplasm of unspecified part of unspecified bronchus or lung: Secondary | ICD-10-CM

## 2023-10-03 DIAGNOSIS — Z87891 Personal history of nicotine dependence: Secondary | ICD-10-CM | POA: Diagnosis not present

## 2023-10-03 DIAGNOSIS — Z5111 Encounter for antineoplastic chemotherapy: Secondary | ICD-10-CM | POA: Diagnosis present

## 2023-10-03 DIAGNOSIS — Z803 Family history of malignant neoplasm of breast: Secondary | ICD-10-CM | POA: Diagnosis not present

## 2023-10-03 DIAGNOSIS — Z5982 Transportation insecurity: Secondary | ICD-10-CM | POA: Insufficient documentation

## 2023-10-03 DIAGNOSIS — C787 Secondary malignant neoplasm of liver and intrahepatic bile duct: Secondary | ICD-10-CM

## 2023-10-03 DIAGNOSIS — I10 Essential (primary) hypertension: Secondary | ICD-10-CM | POA: Diagnosis not present

## 2023-10-03 DIAGNOSIS — Z90710 Acquired absence of both cervix and uterus: Secondary | ICD-10-CM | POA: Diagnosis not present

## 2023-10-03 DIAGNOSIS — C779 Secondary and unspecified malignant neoplasm of lymph node, unspecified: Secondary | ICD-10-CM | POA: Insufficient documentation

## 2023-10-03 DIAGNOSIS — Z79630 Long term (current) use of alkylating agent: Secondary | ICD-10-CM | POA: Insufficient documentation

## 2023-10-03 DIAGNOSIS — Z79899 Other long term (current) drug therapy: Secondary | ICD-10-CM | POA: Diagnosis not present

## 2023-10-03 DIAGNOSIS — Z83438 Family history of other disorder of lipoprotein metabolism and other lipidemia: Secondary | ICD-10-CM | POA: Insufficient documentation

## 2023-10-03 DIAGNOSIS — Z8349 Family history of other endocrine, nutritional and metabolic diseases: Secondary | ICD-10-CM | POA: Insufficient documentation

## 2023-10-03 DIAGNOSIS — E119 Type 2 diabetes mellitus without complications: Secondary | ICD-10-CM | POA: Diagnosis not present

## 2023-10-03 DIAGNOSIS — Z86718 Personal history of other venous thrombosis and embolism: Secondary | ICD-10-CM | POA: Diagnosis not present

## 2023-10-03 DIAGNOSIS — Z8249 Family history of ischemic heart disease and other diseases of the circulatory system: Secondary | ICD-10-CM | POA: Insufficient documentation

## 2023-10-03 LAB — COMPREHENSIVE METABOLIC PANEL
ALT: 10 U/L (ref 0–44)
AST: 15 U/L (ref 15–41)
Albumin: 4.1 g/dL (ref 3.5–5.0)
Alkaline Phosphatase: 69 U/L (ref 38–126)
Anion gap: 9 (ref 5–15)
BUN: 11 mg/dL (ref 8–23)
CO2: 26 mmol/L (ref 22–32)
Calcium: 9.5 mg/dL (ref 8.9–10.3)
Chloride: 106 mmol/L (ref 98–111)
Creatinine, Ser: 0.61 mg/dL (ref 0.44–1.00)
GFR, Estimated: >60 mL/min (ref >60)
Glucose, Bld: 91 mg/dL (ref 70–99)
Potassium: 3.3 mmol/L — ABNORMAL LOW (ref 3.5–5.1)
Sodium: 141 mmol/L (ref 135–145)
Total Bilirubin: 0.8 mg/dL (ref <1.2)
Total Protein: 7.6 g/dL (ref 6.5–8.1)

## 2023-10-03 LAB — CBC WITH DIFFERENTIAL/PLATELET
Abs Immature Granulocytes: 0.01 10*3/uL (ref 0.00–0.07)
Basophils Absolute: 0 10*3/uL (ref 0.0–0.1)
Basophils Relative: 1 %
Eosinophils Absolute: 0 10*3/uL (ref 0.0–0.5)
Eosinophils Relative: 1 %
HCT: 34.2 % — ABNORMAL LOW (ref 36.0–46.0)
Hemoglobin: 11.4 g/dL — ABNORMAL LOW (ref 12.0–15.0)
Immature Granulocytes: 0 %
Lymphocytes Relative: 28 %
Lymphs Abs: 0.9 10*3/uL (ref 0.7–4.0)
MCH: 31.3 pg (ref 26.0–34.0)
MCHC: 33.3 g/dL (ref 30.0–36.0)
MCV: 94 fL (ref 80.0–100.0)
Monocytes Absolute: 0.5 10*3/uL (ref 0.1–1.0)
Monocytes Relative: 14 %
Neutro Abs: 1.9 10*3/uL (ref 1.7–7.7)
Neutrophils Relative %: 56 %
Platelets: 302 10*3/uL (ref 150–400)
RBC: 3.64 MIL/uL — ABNORMAL LOW (ref 3.87–5.11)
RDW: 14.1 % (ref 11.5–15.5)
WBC: 3.3 10*3/uL — ABNORMAL LOW (ref 4.0–10.5)
nRBC: 0 % (ref 0.0–0.2)

## 2023-10-03 MED ORDER — HEPARIN SOD (PORK) LOCK FLUSH 100 UNIT/ML IV SOLN
500.0000 [IU] | Freq: Once | INTRAVENOUS | Status: AC | PRN
  Administered 2023-10-03: 500 [IU]
  Filled 2023-10-03: qty 5

## 2023-10-03 MED ORDER — SODIUM CHLORIDE 0.9 % IV SOLN
Freq: Once | INTRAVENOUS | Status: AC
  Filled 2023-10-03: qty 250

## 2023-10-03 MED ORDER — DEXAMETHASONE SODIUM PHOSPHATE 10 MG/ML IJ SOLN
10.0000 mg | Freq: Once | INTRAMUSCULAR | Status: AC
  Administered 2023-10-03: 10 mg via INTRAVENOUS
  Filled 2023-10-03: qty 1

## 2023-10-03 MED ORDER — PALONOSETRON HCL INJECTION 0.25 MG/5ML
0.2500 mg | Freq: Once | INTRAVENOUS | Status: AC
  Administered 2023-10-03: 0.25 mg via INTRAVENOUS
  Filled 2023-10-03: qty 5

## 2023-10-03 MED ORDER — SODIUM CHLORIDE 0.9 % IV SOLN
2.5600 mg/m2 | Freq: Once | INTRAVENOUS | Status: AC
  Administered 2023-10-03: 4.6 mg via INTRAVENOUS
  Filled 2023-10-03: qty 9.2

## 2023-10-03 MED ORDER — PEGFILGRASTIM 6 MG/0.6ML ~~LOC~~ PSKT
6.0000 mg | PREFILLED_SYRINGE | Freq: Once | SUBCUTANEOUS | Status: AC
Start: 2023-10-03 — End: 2023-10-03
  Administered 2023-10-03: 6 mg via SUBCUTANEOUS
  Filled 2023-10-03: qty 0.6

## 2023-10-03 NOTE — Patient Instructions (Signed)
[facility] - A DEPT OF [facility]  Discharge Instructions: Thank you for choosing [facility] to provide your oncology and hematology care.  If you have a lab appointment with the [facility], please go directly to the [facility] and check in at the registration area.  Wear comfortable clothing and clothing appropriate for easy access to any Portacath or PICC line.   We strive to give you quality time with your provider. You may need to reschedule your appointment if you arrive late (15 or more minutes).  Arriving late affects you and other patients whose appointments are after yours.  Also, if you miss three or more appointments without notifying the office, you may be dismissed from the clinic at the provider's discretion.      For prescription refill requests, have your pharmacy contact our office and allow 72 hours for refills to be completed.    Today you received the following chemotherapy and/or immunotherapy agents ZEPZELCA   and NEULASTA   To help prevent nausea and vomiting after your treatment, we encourage you to take your nausea medication as directed.  BELOW ARE SYMPTOMS THAT SHOULD BE REPORTED IMMEDIATELY: *FEVER GREATER THAN 100.4 F (38 C) OR HIGHER *CHILLS OR SWEATING *NAUSEA AND VOMITING THAT IS NOT CONTROLLED WITH YOUR NAUSEA MEDICATION *UNUSUAL SHORTNESS OF BREATH *UNUSUAL BRUISING OR BLEEDING *URINARY PROBLEMS (pain or burning when urinating, or frequent urination) *BOWEL PROBLEMS (unusual diarrhea, constipation, pain near the anus) TENDERNESS IN MOUTH AND THROAT WITH OR WITHOUT PRESENCE OF ULCERS (sore throat, sores in mouth, or a toothache) UNUSUAL RASH, SWELLING OR PAIN  UNUSUAL VAGINAL DISCHARGE OR ITCHING   Items with * indicate a potential emergency and should be followed up as soon as possible or go to the Emergency Department if any problems should occur.  Please show the CHEMOTHERAPY ALERT CARD or  IMMUNOTHERAPY ALERT CARD at check-in to the Emergency Department and triage nurse.  Should you have questions after your visit or need to cancel or reschedule your appointment, please contact [facility] - A DEPT OF [facility]  [phone] and follow the prompts.  Office hours are 8:00 a.m. to 4:30 p.m. Monday - Friday. Please note that voicemails left after 4:00 p.m. may not be returned until the following business day.  We are closed weekends and major holidays. You have access to a nurse at all times for urgent questions. Please call the main number to the clinic [phone] and follow the prompts.  For any non-urgent questions, you may also contact your provider using MyChart. We now offer e-Visits for anyone 3 and older to request care online for non-urgent symptoms. For details visit [email or web address].   Also download the MyChart app! Go to the app store, search "MyChart", open the app, select [facility], and log in with your MyChart username and password.  Lurbinectedin Injection What is this medication? LURBINECTEDIN (LOOR bin EK te din) treats lung cancer. It works by slowing down the growth of cancer cells. This medicine may be used for other purposes; ask your health care provider or pharmacist if you have questions. COMMON BRAND NAME(S): ZEPZELCA What should I tell my care team before I take this medication? They need to know if you have any of these conditions: Liver disease Low blood cell levels, such as low white cells, platelets, red blood cells An unusual or allergic reaction to lurbinectedin, other medications, foods, dyes, or preservatives If you  or your partner are pregnant or trying to get pregnant Breastfeeding How should I use this medication? This medication is injected into a vein. It is given by your care team in a hospital or clinic setting. Talk to your care team about the use of this medication in children. Special care  may be needed. Overdosage: If you think you have taken too much of this medicine contact a poison control [facility] or emergency room at once. NOTE: This medicine is only for you. Do not share this medicine with others. What if I miss a dose? Keep appointments for follow-up doses. It is important not to miss your dose. Call your care team if you are unable to keep an appointment. What may interact with this medication? Grapefruit juice or [place] oranges Other medications may affect the way this medication works. Talk with your care team about all of the medications you take. They may suggest changes to your treatment plan to lower the risk of side effects and to make sure your medications work as intended. This list may not describe all possible interactions. Give your health care provider a list of all the medicines, herbs, non-prescription drugs, or dietary supplements you use. Also tell them if you smoke, drink alcohol, or use illegal drugs. Some items may interact with your medicine. What should I watch for while using this medication? Your condition will be monitored carefully while you are receiving this medication. This medication may make you feel generally unwell. This is not uncommon as chemotherapy can affect healthy cells as well as cancer cells. Report any side effects. Continue your course of treatment even though you feel ill unless your care team tells you to stop. This medication may increase your risk of getting an infection. Call your care team for advice if you get a fever, chills, sore throat, or other symptoms of a cold or flu. Do not treat yourself. Try to avoid being around people who are sick. Avoid taking medications that contain aspirin, acetaminophen, ibuprofen, naproxen, or ketoprofen unless instructed by your care team. These medications may hide a fever. Be careful brushing or flossing your teeth or using a toothpick because you may get an infection or bleed more easily. If  you have any dental work done, tell your dentist you are receiving this medication. Talk to your care team if you may be pregnant. Serious birth defects can occur if you take this medication during pregnancy and for 6 months after the last dose. You will need a negative pregnancy test before starting this medication. Contraception is recommended while taking this medication and for 6 months after the last dose. If your partner can get pregnant, use a condom during sex while taking this medication and for 4 months after the last dose. Do not breastfeed while taking this medication and for 2 weeks after the last dose. What side effects may I notice from receiving this medication? Side effects that you should report to your care team as soon as possible: Allergic reactions--skin rash, itching, hives, swelling of the face, lips, tongue, or throat Infection--fever, chills, cough, sore throat, wounds that don't heal, pain or trouble when passing urine, general feeling of discomfort or being unwell Liver injury--right upper belly pain, loss of appetite, nausea, light-colored stool, dark yellow or brown urine, yellowing skin or eyes, unusual weakness or fatigue Low red blood cell level--unusual weakness or fatigue, dizziness, headache, trouble breathing Muscle injury--unusual weakness or fatigue, muscle pain, dark yellow or brown urine, decrease in amount  of urine Painful swelling, warmth, or redness of the skin, blisters or sores at the infusion site Unusual bruising or bleeding Side effects that usually do not require medical attention (report these to your care team if they continue or are bothersome): Constipation Cough Diarrhea Fatigue Loss of appetite Nausea This list may not describe all possible side effects. Call your doctor for medical advice about side effects. You may report side effects to FDA at 1-800-FDA-1088. Where should I keep my medication? This medication is given in a hospital or  clinic. It will not be stored at home. NOTE: This sheet is a summary. It may not cover all possible information. If you have questions about this medicine, talk to your doctor, pharmacist, or health care provider.  2024 Elsevier/Gold Standard (2022-06-14 00:00:00) . Pegfilgrastim Injection What is this medication? PEGFILGRASTIM (PEG fil gra stim) lowers the risk of infection in people who are receiving chemotherapy. It works by Systems analyst make more white blood cells, which protects your body from infection. It may also be used to help people who have been exposed to high doses of radiation. This medicine may be used for other purposes; ask your health care provider or pharmacist if you have questions. COMMON BRAND NAME(S): Cherly Hensen, Neulasta, Nyvepria, Stimufend, UDENYCA, UDENYCA ONBODY, Ziextenzo What should I tell my care team before I take this medication? They need to know if you have any of these conditions: Kidney disease Latex allergy Ongoing radiation therapy Sickle cell disease Skin reactions to acrylic adhesives (On-Body Injector only) An unusual or allergic reaction to pegfilgrastim, filgrastim, other medications, foods, dyes, or preservatives Pregnant or trying to get pregnant Breast-feeding How should I use this medication? This medication is for injection under the skin. If you get this medication at home, you will be taught how to prepare and give the pre-filled syringe or how to use the On-body Injector. Refer to the patient Instructions for Use for detailed instructions. Use exactly as directed. Tell your care team immediately if you suspect that the On-body Injector may not have performed as intended or if you suspect the use of the On-body Injector resulted in a missed or partial dose. It is important that you put your used needles and syringes in a special sharps container. Do not put them in a trash can. If you do not have a sharps container, call your  pharmacist or care team to get one. Talk to your care team about the use of this medication in children. While this medication may be prescribed for selected conditions, precautions do apply. Overdosage: If you think you have taken too much of this medicine contact a poison control [facility] or emergency room at once. NOTE: This medicine is only for you. Do not share this medicine with others. What if I miss a dose? It is important not to miss your dose. Call your care team if you miss your dose. If you miss a dose due to an On-body Injector failure or leakage, a new dose should be administered as soon as possible using a single prefilled syringe for manual use. What may interact with this medication? Interactions have not been studied. This list may not describe all possible interactions. Give your health care provider a list of all the medicines, herbs, non-prescription drugs, or dietary supplements you use. Also tell them if you smoke, drink alcohol, or use illegal drugs. Some items may interact with your medicine. What should I watch for while using this medication? Your condition will  be monitored carefully while you are receiving this medication. You may need blood work done while you are taking this medication. Talk to your care team about your risk of cancer. You may be more at risk for certain types of cancer if you take this medication. If you are going to need a MRI, CT scan, or other procedure, tell your care team that you are using this medication (On-Body Injector only). What side effects may I notice from receiving this medication? Side effects that you should report to your care team as soon as possible: Allergic reactions--skin rash, itching, hives, swelling of the face, lips, tongue, or throat Capillary leak syndrome--stomach or muscle pain, unusual weakness or fatigue, feeling faint or lightheaded, decrease in the amount of urine, swelling of the ankles, hands, or feet, trouble  breathing High white blood cell level--fever, fatigue, trouble breathing, night sweats, change in vision, weight loss Inflammation of the aorta--fever, fatigue, back, chest, or stomach pain, severe headache Kidney injury (glomerulonephritis)--decrease in the amount of urine, red or dark brown urine, foamy or bubbly urine, swelling of the ankles, hands, or feet Shortness of breath or trouble breathing Spleen injury--pain in upper left stomach or shoulder Unusual bruising or bleeding Side effects that usually do not require medical attention (report to your care team if they continue or are bothersome): Bone pain Pain in the hands or feet This list may not describe all possible side effects. Call your doctor for medical advice about side effects. You may report side effects to FDA at 1-800-FDA-1088. Where should I keep my medication? Keep out of the reach of children. If you are using this medication at home, you will be instructed on how to store it. Throw away any unused medication after the expiration date on the label. NOTE: This sheet is a summary. It may not cover all possible information. If you have questions about this medicine, talk to your doctor, pharmacist, or health care provider.  2024 Elsevier/Gold Standard (2021-10-01 00:00:00)

## 2023-10-03 NOTE — Progress Notes (Signed)
Hematology/Oncology Consult note [facility]  [phone] [phone]  Patient Care Team: Mick Sell, MD as PCP - General (Infectious Diseases) Glory Buff, RN as Oncology Nurse Navigator Creig Hines, MD as Consulting Physician (Hematology and Oncology)   Name of the patient: [facility]  [id number]  18-Jul-1951   Date of visit: 10/03/23  Diagnosis-  extensive stage small cell lung cancer with liver lymph node and brain metastases   Chief complaint/ Reason for visit-on treatment assessment prior to cycle 20 of lurbinectedin  Heme/Onc history: Patient is a 72 year old female with a remote history of smoking in her teenage years and exposure to passive smoking.  She finally had a CT chest with contrast done in October 2021 which showed a large mass in the left side of the mediastinum measuring 6.7 x 6.2 x 6.1 cm causing severe compression of the left main pulmonary artery and around the aortic arch in the left subclavian artery.  Enlarged thyroid gland.  Left upper lobe nodule 1 x 0.7 cm.  She then underwent bronchoscopy with Dr.Aleskerov left upper lobe biopsy was nondiagnostic.  However station 4, 7 and 10 L lymph nodes were consistent with metastatic small cell carcinoma.   PET CT scan showed enlarged level 3 cervical lymph node 2.2 cm that was hypermetabolic with an SUV of 9.7.  Large central 7.3 cm AP window mass.  5.7 cm left liver mass.  MRI brain with and without contrast showed 2 small foci of enhancement in the right cerebellar hemisphere and right temporal lobe without mass-effect or edema as well concerning for brain metastases   Patient had 2 cycles of carbo etoposide chemotherapy along with Tecentriq and subsequent scan showed no significant improvement in the primary lung mass or liver mass.  She received radiation treatment to her primary lung mass and also seen by [facility] for second opinion.  Her pathology was reviewed  at [facility] and reported as possible neuroendocrine neoplasm But stated that small cell carcinoma cannot be excluded but not definitely diagnostic for it.  However [facility] pathology feels that this is consistent with small cell lung cancer.  She has completed 4 cycles of carbo etoposide Tecentriq chemotherapy and is currently on maintenance Tecentriq.  Repeat liver biopsy was also performed and was consistent with small cell lung cancer   Disease progression after 23 cycles of maintenance Tecentriq in the left supraclavicular lymph node region.  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  Plan to switch patient to second line lurbinectedin since November 2023      Interval history-tolerating treatments well so far.  Appetite and weight have remained stable.  Denies any leg swelling.  Denies any new pain at this time.  ECOG PS- 1 Pain scale- 0   Review of systems- Review of Systems  Constitutional:  Negative for chills, fever, malaise/fatigue and weight loss.  HENT:  Negative for congestion, ear discharge and nosebleeds.   Eyes:  Negative for blurred vision.  Respiratory:  Negative for cough, hemoptysis, sputum production, shortness of breath and wheezing.   Cardiovascular:  Negative for chest pain, palpitations, orthopnea and claudication.  Gastrointestinal:  Negative for abdominal pain, blood in stool, constipation, diarrhea, heartburn, melena, nausea and vomiting.  Genitourinary:  Negative for dysuria, flank pain, frequency, hematuria and urgency.  Musculoskeletal:  Negative for back pain, joint pain and myalgias.  Skin:  Negative for rash.  Neurological:  Negative for dizziness, tingling, focal weakness, seizures, weakness and headaches.  Endo/Heme/Allergies:  Does not bruise/bleed easily.  Psychiatric/Behavioral:  Negative for depression and suicidal ideas. The patient does not have insomnia.       No Known Allergies   Past Medical History:  Diagnosis  Date   Cancer (HCC)    Cataract    Diabetes mellitus without complication (HCC)    Hyperlipidemia    Hypertension    Hypothyroidism    doctor's keeping an eye on thyroid    Lung cancer [facility])      Past Surgical History:  Procedure Laterality Date   ABDOMINAL HYSTERECTOMY     IR CV LINE INJECTION  09/28/2021   MYRINGOTOMY WITH TUBE PLACEMENT Right 12/29/2022   Procedure: MYRINGOTOMY WITH TUBE PLACEMENT;  Surgeon: Vernie Murders, MD;  [facility];  Service: ENT;  Laterality: Right;  Diabetic   PORTA CATH INSERTION N/A 11/30/2020   Procedure: PORTA CATH INSERTION;  Surgeon: Annice Needy, MD;  Location: [facility] INVASIVE CV LAB;  Service: Cardiovascular;  Laterality: N/A;   VIDEO BRONCHOSCOPY WITH ENDOBRONCHIAL NAVIGATION N/A 10/21/2020   Procedure: VIDEO BRONCHOSCOPY WITH ENDOBRONCHIAL NAVIGATION;  Surgeon: Vida Rigger, MD;  Location: [facility] ORS;  Service: Thoracic;  Laterality: N/A;   VIDEO BRONCHOSCOPY WITH ENDOBRONCHIAL ULTRASOUND N/A 10/21/2020   Procedure: VIDEO BRONCHOSCOPY WITH ENDOBRONCHIAL ULTRASOUND;  Surgeon: Vida Rigger, MD;  Location: [facility] ORS;  Service: Thoracic;  Laterality: N/A;    Social History   Socioeconomic History   Marital status: Single    Spouse name: Not on file   Number of children: Not on file   Years of education: Not on file   Highest education level: Not on file  Occupational History   Not on file  Tobacco Use   Smoking status: Never   Smokeless tobacco: Never  Vaping Use   Vaping status: Never Used  Substance and Sexual Activity   Alcohol use: No   Drug use: No   Sexual activity: Not Currently  Other Topics Concern   Not on file  Social History Narrative   Not on file   Social Determinants of Health   Financial Resource Strain: Low Risk  (03/22/2023)   Received [facility], [facility]   Overall Financial Resource Strain (CARDIA)    Difficulty of Paying Living Expenses: Not hard at  all  Food Insecurity: No Food Insecurity (03/22/2023)   Received [facility], [facility]   Hunger Vital Sign    Worried About Running Out of Food in the Last Year: Never true    Ran Out of Food in the Last Year: Never true  Transportation Needs: Unmet Transportation Needs (03/28/2023)   PRAPARE - Administrator, Civil Service (Medical): Yes    Lack of Transportation (Non-Medical): Yes  Physical Activity: Not on file  Stress: Not on file  Social Connections: Not on file  Intimate Partner Violence: Not on file    Family History  Problem Relation Age of Onset   Cancer Mother        breast   Hypertension Mother    Breast cancer Mother 88   Heart disease Father    Hyperlipidemia Brother    Heart disease Brother      Current Outpatient Medications:    apixaban (ELIQUIS) 5 MG TABS tablet, Take 1 tablet (5 mg total) by mouth 2 (two) times daily., Disp: 60 tablet, Rfl: 1   fluticasone (FLONASE) 50 MCG/ACT nasal spray, Place 2 sprays into both nostrils  daily., Disp: 16 g, Rfl: 5   lidocaine-prilocaine (EMLA) cream, Apply 1 Application topically as needed. Apply small amount to port site at least 1 hour prior to it being accessed, cover with plastic wrap, Disp: 30 g, Rfl: 1   losartan (COZAAR) 25 MG tablet, Take 1 tablet by mouth daily., Disp: , Rfl:    metFORMIN (GLUCOPHAGE-XR) 500 MG 24 hr tablet, Take by mouth., Disp: , Rfl:    potassium chloride SA (KLOR-CON M) 20 MEQ tablet, TAKE 1 TABLET(20 MEQ) BY MOUTH DAILY, Disp: 90 tablet, Rfl: 2 No current facility-administered medications for this visit.  Facility-Administered Medications Ordered in Other Visits:    sodium chloride flush (NS) 0.9 % injection 10 mL, 10 mL, Intravenous, PRN, Creig Hines, MD, 10 mL at 12/15/20 0850   sodium chloride flush (NS) 0.9 % injection 10 mL, 10 mL, Intravenous, PRN, Creig Hines, MD, 10 mL at 03/07/23 0923  Physical exam:  Vitals:   10/03/23 0959   BP: (!) 160/86  Pulse: 69  Resp: 18  Temp: 98.2 F (36.8 C)  TempSrc: Tympanic  SpO2: 100%  Weight: 171 lb (77.6 kg)   Physical Exam Cardiovascular:     Rate and Rhythm: Normal rate and regular rhythm.     Heart sounds: Normal heart sounds.  Pulmonary:     Effort: Pulmonary effort is normal.     Breath sounds: Normal breath sounds.  Musculoskeletal:     Right lower leg: No edema.     Left lower leg: No edema.  Lymphadenopathy:     Comments: Palpable left supraclavicular adenopathy looks mildly increased as compared to prior  Skin:    General: Skin is warm and dry.  Neurological:     Mental Status: She is alert and oriented to person, place, and time.         Latest Ref Rng & Units 10/03/2023    9:22 AM  CMP  Glucose 70 - 99 mg/dL 91   BUN 8 - 23 mg/dL 11   Creatinine 6.96 - 1.00 mg/dL 2.95   Sodium 284 - 132 mmol/L 141   Potassium 3.5 - 5.1 mmol/L 3.3   Chloride 98 - 111 mmol/L 106   CO2 22 - 32 mmol/L 26   Calcium 8.9 - 10.3 mg/dL 9.5   Total Protein 6.5 - 8.1 g/dL 7.6   Total Bilirubin <4.4 mg/dL 0.8   Alkaline Phos 38 - 126 U/L 69   AST 15 - 41 U/L 15   ALT 0 - 44 U/L 10       Latest Ref Rng & Units 10/03/2023    9:22 AM  CBC  WBC 4.0 - 10.5 K/uL 3.3   Hemoglobin 12.0 - 15.0 g/dL 01.0   Hematocrit 27.2 - 46.0 % 34.2   Platelets 150 - 400 K/uL 302      Assessment and plan- Patient is a 72 y.o. female with extensive stage small cell lung cancer with lymph node and brain metastases.  She is here for on treatment assessment prior to cycle 20 of lurbinectedin  Counts okay to proceed with cycle 20 of lurbinectedin today with growth factor support.  I will see her back in 3 weeks for cycle 21.  Will plan to get CT chest abdomen pelvis with contrast prior.  Hypokalemia: Continue oral potassium 20 mill equivalents daily  History of renal vein thrombosis: Continue Eliquis   Visit Diagnosis 1. Small cell carcinoma of lung metastatic to liver (HCC)   2.  Encounter for antineoplastic chemotherapy   3. Current use of long term anticoagulation   4. Hypokalemia      Dr. Owens Shark, MD, MPH [facility] [phone] 10/03/2023 3:00 PM

## 2023-10-05 ENCOUNTER — Ambulatory Visit: Payer: 59

## 2023-10-06 ENCOUNTER — Ambulatory Visit: Payer: 59

## 2023-10-17 ENCOUNTER — Ambulatory Visit
Admission: RE | Admit: 2023-10-17 | Discharge: 2023-10-17 | Disposition: A | Discharge status: Home / Self Care | Payer: 59 | Source: Ambulatory Visit | Attending: Oncology | Admitting: Oncology

## 2023-10-17 DIAGNOSIS — C787 Secondary malignant neoplasm of liver and intrahepatic bile duct: Secondary | ICD-10-CM | POA: Diagnosis present

## 2023-10-17 DIAGNOSIS — C3490 Malignant neoplasm of unspecified part of unspecified bronchus or lung: Secondary | ICD-10-CM | POA: Diagnosis present

## 2023-10-17 MED ORDER — IOHEXOL 300 MG/ML  SOLN
100.0000 mL | Freq: Once | INTRAMUSCULAR | Status: AC | PRN
  Administered 2023-10-17: 100 mL via INTRAVENOUS

## 2023-10-24 ENCOUNTER — Inpatient Hospital Stay: Payer: 59

## 2023-10-24 ENCOUNTER — Encounter: Payer: Self-pay | Admitting: Oncology

## 2023-10-24 ENCOUNTER — Inpatient Hospital Stay: Payer: 59 | Attending: Oncology | Admitting: Oncology

## 2023-10-24 VITALS — BP 174/73 | HR 70 | Temp 97.6°F | Resp 20 | Wt 173.0 lb

## 2023-10-24 DIAGNOSIS — D701 Agranulocytosis secondary to cancer chemotherapy: Secondary | ICD-10-CM | POA: Diagnosis not present

## 2023-10-24 DIAGNOSIS — Z8349 Family history of other endocrine, nutritional and metabolic diseases: Secondary | ICD-10-CM | POA: Diagnosis not present

## 2023-10-24 DIAGNOSIS — C3490 Malignant neoplasm of unspecified part of unspecified bronchus or lung: Secondary | ICD-10-CM

## 2023-10-24 DIAGNOSIS — Z90710 Acquired absence of both cervix and uterus: Secondary | ICD-10-CM | POA: Insufficient documentation

## 2023-10-24 DIAGNOSIS — Z5111 Encounter for antineoplastic chemotherapy: Secondary | ICD-10-CM | POA: Insufficient documentation

## 2023-10-24 DIAGNOSIS — Z87891 Personal history of nicotine dependence: Secondary | ICD-10-CM | POA: Diagnosis not present

## 2023-10-24 DIAGNOSIS — R590 Localized enlarged lymph nodes: Secondary | ICD-10-CM | POA: Diagnosis not present

## 2023-10-24 DIAGNOSIS — Z7722 Contact with and (suspected) exposure to environmental tobacco smoke (acute) (chronic): Secondary | ICD-10-CM | POA: Diagnosis not present

## 2023-10-24 DIAGNOSIS — Z83438 Family history of other disorder of lipoprotein metabolism and other lipidemia: Secondary | ICD-10-CM | POA: Diagnosis not present

## 2023-10-24 DIAGNOSIS — R160 Hepatomegaly, not elsewhere classified: Secondary | ICD-10-CM | POA: Diagnosis not present

## 2023-10-24 DIAGNOSIS — C7931 Secondary malignant neoplasm of brain: Secondary | ICD-10-CM | POA: Insufficient documentation

## 2023-10-24 DIAGNOSIS — T451X5A Adverse effect of antineoplastic and immunosuppressive drugs, initial encounter: Secondary | ICD-10-CM | POA: Diagnosis not present

## 2023-10-24 DIAGNOSIS — E049 Nontoxic goiter, unspecified: Secondary | ICD-10-CM | POA: Diagnosis not present

## 2023-10-24 DIAGNOSIS — E119 Type 2 diabetes mellitus without complications: Secondary | ICD-10-CM | POA: Diagnosis not present

## 2023-10-24 DIAGNOSIS — Z7901 Long term (current) use of anticoagulants: Secondary | ICD-10-CM | POA: Diagnosis not present

## 2023-10-24 DIAGNOSIS — C787 Secondary malignant neoplasm of liver and intrahepatic bile duct: Secondary | ICD-10-CM | POA: Diagnosis not present

## 2023-10-24 DIAGNOSIS — Z86718 Personal history of other venous thrombosis and embolism: Secondary | ICD-10-CM | POA: Insufficient documentation

## 2023-10-24 DIAGNOSIS — E876 Hypokalemia: Secondary | ICD-10-CM | POA: Insufficient documentation

## 2023-10-24 DIAGNOSIS — Z79899 Other long term (current) drug therapy: Secondary | ICD-10-CM | POA: Diagnosis not present

## 2023-10-24 DIAGNOSIS — C3412 Malignant neoplasm of upper lobe, left bronchus or lung: Secondary | ICD-10-CM | POA: Diagnosis present

## 2023-10-24 DIAGNOSIS — I10 Essential (primary) hypertension: Secondary | ICD-10-CM | POA: Diagnosis not present

## 2023-10-24 DIAGNOSIS — Z8249 Family history of ischemic heart disease and other diseases of the circulatory system: Secondary | ICD-10-CM | POA: Diagnosis not present

## 2023-10-24 DIAGNOSIS — Z79630 Long term (current) use of alkylating agent: Secondary | ICD-10-CM | POA: Diagnosis not present

## 2023-10-24 DIAGNOSIS — Z803 Family history of malignant neoplasm of breast: Secondary | ICD-10-CM | POA: Insufficient documentation

## 2023-10-24 DIAGNOSIS — Z5982 Transportation insecurity: Secondary | ICD-10-CM | POA: Diagnosis not present

## 2023-10-24 LAB — COMPREHENSIVE METABOLIC PANEL
ALT: 9 U/L (ref 0–44)
AST: 13 U/L — ABNORMAL LOW (ref 15–41)
Albumin: 4 g/dL (ref 3.5–5.0)
Alkaline Phosphatase: 67 U/L (ref 38–126)
Anion gap: 7 (ref 5–15)
BUN: 13 mg/dL (ref 8–23)
CO2: 23 mmol/L (ref 22–32)
Calcium: 9.2 mg/dL (ref 8.9–10.3)
Chloride: 111 mmol/L (ref 98–111)
Creatinine, Ser: 0.57 mg/dL (ref 0.44–1.00)
GFR, Estimated: >60 mL/min (ref >60)
Glucose, Bld: 101 mg/dL — ABNORMAL HIGH (ref 70–99)
Potassium: 3.6 mmol/L (ref 3.5–5.1)
Sodium: 141 mmol/L (ref 135–145)
Total Bilirubin: 0.5 mg/dL (ref <1.2)
Total Protein: 6.9 g/dL (ref 6.5–8.1)

## 2023-10-24 LAB — CBC WITH DIFFERENTIAL/PLATELET
Abs Immature Granulocytes: 0.01 10*3/uL (ref 0.00–0.07)
Basophils Absolute: 0 10*3/uL (ref 0.0–0.1)
Basophils Relative: 1 %
Eosinophils Absolute: 0.1 10*3/uL (ref 0.0–0.5)
Eosinophils Relative: 2 %
HCT: 33.1 % — ABNORMAL LOW (ref 36.0–46.0)
Hemoglobin: 11 g/dL — ABNORMAL LOW (ref 12.0–15.0)
Immature Granulocytes: 0 %
Lymphocytes Relative: 24 %
Lymphs Abs: 0.6 10*3/uL — ABNORMAL LOW (ref 0.7–4.0)
MCH: 31.3 pg (ref 26.0–34.0)
MCHC: 33.2 g/dL (ref 30.0–36.0)
MCV: 94.3 fL (ref 80.0–100.0)
Monocytes Absolute: 0.4 10*3/uL (ref 0.1–1.0)
Monocytes Relative: 15 %
Neutro Abs: 1.6 10*3/uL — ABNORMAL LOW (ref 1.7–7.7)
Neutrophils Relative %: 58 %
Platelets: 292 10*3/uL (ref 150–400)
RBC: 3.51 MIL/uL — ABNORMAL LOW (ref 3.87–5.11)
RDW: 14.3 % (ref 11.5–15.5)
WBC: 2.7 10*3/uL — ABNORMAL LOW (ref 4.0–10.5)
nRBC: 0 % (ref 0.0–0.2)

## 2023-10-24 MED ORDER — SODIUM CHLORIDE 0.9 % IV SOLN
Freq: Once | INTRAVENOUS | Status: AC
Start: 2023-10-24 — End: 2023-10-24
  Filled 2023-10-24: qty 250

## 2023-10-24 MED ORDER — LURBINECTEDIN CHEMO IV INJECTION 4 MG
2.5600 mg/m2 | Freq: Once | INTRAVENOUS | Status: AC
  Administered 2023-10-24: 4.6 mg via INTRAVENOUS
  Filled 2023-10-24: qty 9.2

## 2023-10-24 MED ORDER — PEGFILGRASTIM 6 MG/0.6ML ~~LOC~~ PSKT: 6.0000 mg | PREFILLED_SYRINGE | Freq: Once | SUBCUTANEOUS | Status: DC

## 2023-10-24 MED ORDER — DEXAMETHASONE SODIUM PHOSPHATE 10 MG/ML IJ SOLN
10.0000 mg | Freq: Once | INTRAMUSCULAR | Status: AC
  Administered 2023-10-24: 10 mg via INTRAVENOUS
  Filled 2023-10-24: qty 1

## 2023-10-24 MED ORDER — PEGFILGRASTIM 6 MG/0.6ML ~~LOC~~ PSKT
6.0000 mg | PREFILLED_SYRINGE | Freq: Once | SUBCUTANEOUS | Status: AC
  Administered 2023-10-24: 6 mg via SUBCUTANEOUS
  Filled 2023-10-24: qty 0.6

## 2023-10-24 MED ORDER — PALONOSETRON HCL INJECTION 0.25 MG/5ML
0.2500 mg | Freq: Once | INTRAVENOUS | Status: AC
  Administered 2023-10-24: 0.25 mg via INTRAVENOUS
  Filled 2023-10-24: qty 5

## 2023-10-24 NOTE — Progress Notes (Signed)
Hematology/Oncology Consult note [facility]  Telephone:([phone] [phone]  Patient Care Team: Mick Sell, MD as PCP - General (Infectious Diseases) Glory Buff, RN as Oncology Nurse Navigator Creig Hines, MD as Consulting Physician (Hematology and Oncology)   Name of the patient: Doris Johnson  [id number]  [birth year]   Date of visit: 10/24/23  Diagnosis- extensive stage small cell lung cancer with liver lymph node and brain metastases   Chief complaint/ Reason for visit-on treatment assessment prior to cycle 21 of lurbinectedin  Heme/Onc history:  Patient is a 72 year old female with a remote history of smoking in her teenage years and exposure to passive smoking.  She finally had a CT chest with contrast done in October 2021 which showed a large mass in the left side of the mediastinum measuring 6.7 x 6.2 x 6.1 cm causing severe compression of the left main pulmonary artery and around the aortic arch in the left subclavian artery.  Enlarged thyroid gland.  Left upper lobe nodule 1 x 0.7 cm.  She then underwent bronchoscopy with Dr.Aleskerov left upper lobe biopsy was nondiagnostic.  However station 4, 7 and 10 L lymph nodes were consistent with metastatic small cell carcinoma.   PET CT scan showed enlarged level 3 cervical lymph node 2.2 cm that was hypermetabolic with an SUV of 9.7.  Large central 7.3 cm AP window mass.  5.7 cm left liver mass.  MRI brain with and without contrast showed 2 small foci of enhancement in the right cerebellar hemisphere and right temporal lobe without mass-effect or edema as well concerning for brain metastases   Patient had 2 cycles of carbo etoposide chemotherapy along with Tecentriq and subsequent scan showed no significant improvement in the primary lung mass or liver mass.  She received radiation treatment to her primary lung mass and also seen by [facility] for second opinion.  Her pathology was reviewed  at [facility] and reported as possible neuroendocrine neoplasm But stated that small cell carcinoma cannot be excluded but not definitely diagnostic for it.  However [facility] pathology feels that this is consistent with small cell lung cancer.  She has completed 4 cycles of carbo etoposide Tecentriq chemotherapy and is currently on maintenance Tecentriq.  Repeat liver biopsy was also performed and was consistent with small cell lung cancer   Disease progression after 23 cycles of maintenance Tecentriq in the left supraclavicular lymph node region.  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  Plan to switch patient to second line lurbinectedin since November 2023      Interval history-patient is tolerating lurbinectedin well without any significant side effects.  Continues to have a good appetite and quality of life  ECOG PS- 1 Pain scale- 0   Review of systems- Review of Systems  Constitutional:  Negative for chills, fever, malaise/fatigue and weight loss.  HENT:  Negative for congestion, ear discharge and nosebleeds.   Eyes:  Negative for blurred vision.  Respiratory:  Negative for cough, hemoptysis, sputum production, shortness of breath and wheezing.   Cardiovascular:  Negative for chest pain, palpitations, orthopnea and claudication.  Gastrointestinal:  Negative for abdominal pain, blood in stool, constipation, diarrhea, heartburn, melena, nausea and vomiting.  Genitourinary:  Negative for dysuria, flank pain, frequency, hematuria and urgency.  Musculoskeletal:  Negative for back pain, joint pain and myalgias.  Skin:  Negative for rash.  Neurological:  Negative for dizziness, tingling, focal weakness, seizures, weakness and headaches.  Endo/Heme/Allergies:  Does  not bruise/bleed easily.  Psychiatric/Behavioral:  Negative for depression and suicidal ideas. The patient does not have insomnia.       No Known Allergies   Past Medical History:  Diagnosis Date    Cancer (HCC)    Cataract    Diabetes mellitus without complication (HCC)    Hyperlipidemia    Hypertension    Hypothyroidism    doctor's keeping an eye on thyroid    Lung cancer [facility])      Past Surgical History:  Procedure Laterality Date   ABDOMINAL HYSTERECTOMY     IR CV LINE INJECTION  09/28/2021   MYRINGOTOMY WITH TUBE PLACEMENT Right 12/29/2022   Procedure: MYRINGOTOMY WITH TUBE PLACEMENT;  Surgeon: Vernie Murders, MD;  Location: [facility];  Service: ENT;  Laterality: Right;  Diabetic   PORTA CATH INSERTION N/A 11/30/2020   Procedure: PORTA CATH INSERTION;  Surgeon: Annice Needy, MD;  Location: [facility] INVASIVE CV LAB;  Service: Cardiovascular;  Laterality: N/A;   VIDEO BRONCHOSCOPY WITH ENDOBRONCHIAL NAVIGATION N/A 10/21/2020   Procedure: VIDEO BRONCHOSCOPY WITH ENDOBRONCHIAL NAVIGATION;  Surgeon: Vida Rigger, MD;  Location: [facility] ORS;  Service: Thoracic;  Laterality: N/A;   VIDEO BRONCHOSCOPY WITH ENDOBRONCHIAL ULTRASOUND N/A 10/21/2020   Procedure: VIDEO BRONCHOSCOPY WITH ENDOBRONCHIAL ULTRASOUND;  Surgeon: Vida Rigger, MD;  Location: [facility] ORS;  Service: Thoracic;  Laterality: N/A;    Social History   Socioeconomic History   Marital status: Single    Spouse name: Not on file   Number of children: Not on file   Years of education: Not on file   Highest education level: Not on file  Occupational History   Not on file  Tobacco Use   Smoking status: Never   Smokeless tobacco: Never  Vaping Use   Vaping status: Never Used  Substance and Sexual Activity   Alcohol use: No   Drug use: No   Sexual activity: Not Currently  Other Topics Concern   Not on file  Social History Narrative   Not on file   Social Determinants of Health   Financial Resource Strain: Low Risk  (03/22/2023)   [facility], [facility]   Overall Financial Resource Strain (CARDIA)    Difficulty of Paying Living Expenses: Not hard at all   Food Insecurity: No Food Insecurity (03/22/2023)   Received from [facility], [facility]   Hunger Vital Sign    Worried About Running Out of Food in the Last Year: Never true    Ran Out of Food in the Last Year: Never true  Transportation Needs: Unmet Transportation Needs (03/28/2023)   PRAPARE - Administrator, Civil Service (Medical): Yes    Lack of Transportation (Non-Medical): Yes  Physical Activity: Not on file  Stress: Not on file  Social Connections: Not on file  Intimate Partner Violence: Not on file    Family History  Problem Relation Age of Onset   Cancer Mother        breast   Hypertension Mother    Breast cancer Mother 56   Heart disease Father    Hyperlipidemia Brother    Heart disease Brother      Current Outpatient Medications:    apixaban (ELIQUIS) 5 MG TABS tablet, Take 1 tablet (5 mg total) by mouth 2 (two) times daily., Disp: 60 tablet, Rfl: 1   fluticasone (FLONASE) 50 MCG/ACT nasal spray, Place 2 sprays into both nostrils daily., Disp: 16  g, Rfl: 5   lidocaine-prilocaine (EMLA) cream, Apply 1 Application topically as needed. Apply small amount to port site at least 1 hour prior to it being accessed, cover with plastic wrap, Disp: 30 g, Rfl: 1   losartan (COZAAR) 25 MG tablet, Take 1 tablet by mouth daily., Disp: , Rfl:    metFORMIN (GLUCOPHAGE-XR) 500 MG 24 hr tablet, Take by mouth., Disp: , Rfl:    potassium chloride SA (KLOR-CON M) 20 MEQ tablet, TAKE 1 TABLET(20 MEQ) BY MOUTH DAILY, Disp: 90 tablet, Rfl: 2 No current facility-administered medications for this visit.  Facility-Administered Medications Ordered in Other Visits:    sodium chloride flush (NS) 0.9 % injection 10 mL, 10 mL, Intravenous, PRN, Creig Hines, MD, 10 mL at 12/15/20 0850   sodium chloride flush (NS) 0.9 % injection 10 mL, 10 mL, Intravenous, PRN, Creig Hines, MD, 10 mL at 03/07/23 0923  Physical exam:  Vitals:   10/24/23 0856   BP: (!) 174/73  Pulse: 70  Resp: 20  Temp: 97.6 F (36.4 C)  SpO2: 100%  Weight: 173 lb (78.5 kg)   Physical Exam Cardiovascular:     Rate and Rhythm: Normal rate and regular rhythm.     Heart sounds: Normal heart sounds.  Pulmonary:     Effort: Pulmonary effort is normal.     Breath sounds: Normal breath sounds.  Abdominal:     General: Bowel sounds are normal.     Palpations: Abdomen is soft.  Skin:    General: Skin is warm and dry.  Neurological:     Mental Status: She is alert and oriented to person, place, and time.         Latest Ref Rng & Units 10/24/2023    8:45 AM  CMP  Glucose 70 - 99 mg/dL 409   BUN 8 - 23 mg/dL 13   Creatinine 8.11 - 1.00 mg/dL 9.14   Sodium 782 - 956 mmol/L 141   Potassium 3.5 - 5.1 mmol/L 3.6   Chloride 98 - 111 mmol/L 111   CO2 22 - 32 mmol/L 23   Calcium 8.9 - 10.3 mg/dL 9.2   Total Protein 6.5 - 8.1 g/dL 6.9   Total Bilirubin <2.1 mg/dL 0.5   Alkaline Phos 38 - 126 U/L 67   AST 15 - 41 U/L 13   ALT 0 - 44 U/L 9       Latest Ref Rng & Units 10/24/2023    8:45 AM  CBC  WBC 4.0 - 10.5 K/uL 2.7   Hemoglobin 12.0 - 15.0 g/dL 30.8   Hematocrit 65.7 - 46.0 % 33.1   Platelets 150 - 400 K/uL 292      Assessment and plan- Patient is a 72 y.o. female with extensive stage small cell lung cancer with lymph node and brain metastases.  She is here for on treatment assessment prior to cycle 21 of lurbinectedin  Counts okay to proceed with cycle 21 of lurbinectedin today with Udenyca on day 3.  She will be seen by covering NP in 4 weeks for cycle 22 and I will see her back in 7 weeks for cycle 23.  I have reviewed CT chest abdomen and pelvis images independently and discussed findings with the patient which does not show any evidence of recurrent or progressive disease.  Stable postradiation changes in the left upper lobe.  Unchanged AP window and left supraclavicular adenopathy.  Unchanged treated metastases in the left lobe of the liver.  Plan is to continue lurbinectedin until progression or toxicity  Chronic hypokalemia: Continue oral potassium  History of renal vein thrombosis: Continue Eliquis   Visit Diagnosis 1. Small cell carcinoma of lung metastatic to liver (HCC)   2. Encounter for antineoplastic chemotherapy   3. Chemotherapy induced neutropenia (HCC)   4. Current use of long term anticoagulation      Dr. Owens Shark, MD, MPH [facility] at [facility] [phone] 10/24/2023 9:17 AM

## 2023-10-24 NOTE — Patient Instructions (Addendum)
[facility] - A DEPT OF [facility]  Discharge Instructions: Thank you for choosing [facility] to provide your oncology and hematology care.  If you have a lab appointment with the Cancer Center, please go directly to the Cancer Center and check in at the registration area.  Wear comfortable clothing and clothing appropriate for easy access to any Portacath or PICC line.   We strive to give you quality time with your provider. You may need to reschedule your appointment if you arrive late (15 or more minutes).  Arriving late affects you and other patients whose appointments are after yours.  Also, if you miss three or more appointments without notifying the office, you may be dismissed from the clinic at the provider's discretion.      For prescription refill requests, have your pharmacy contact our office and allow 72 hours for refills to be completed.    Today you received the following chemotherapy and/or immunotherapy agents Zepzelca      To help prevent nausea and vomiting after your treatment, we encourage you to take your nausea medication as directed.  BELOW ARE SYMPTOMS THAT SHOULD BE REPORTED IMMEDIATELY: *FEVER GREATER THAN 100.4 F (38 C) OR HIGHER *CHILLS OR SWEATING *NAUSEA AND VOMITING THAT IS NOT CONTROLLED WITH YOUR NAUSEA MEDICATION *UNUSUAL SHORTNESS OF BREATH *UNUSUAL BRUISING OR BLEEDING *URINARY PROBLEMS (pain or burning when urinating, or frequent urination) *BOWEL PROBLEMS (unusual diarrhea, constipation, pain near the anus) TENDERNESS IN MOUTH AND THROAT WITH OR WITHOUT PRESENCE OF ULCERS (sore throat, sores in mouth, or a toothache) UNUSUAL RASH, SWELLING OR PAIN  UNUSUAL VAGINAL DISCHARGE OR ITCHING   Items with * indicate a potential emergency and should be followed up as soon as possible or go to the Emergency Department if any problems should occur.  Please show the CHEMOTHERAPY ALERT CARD or IMMUNOTHERAPY  ALERT CARD at check-in to the Emergency Department and triage nurse.  Should you have questions after your visit or need to cancel or reschedule your appointment, please contact [facility] - A DEPT OF [facility]  [phone] and follow the prompts.  Office hours are 8:00 a.m. to 4:30 p.m. Monday - Friday. Please note that voicemails left after 4:00 p.m. may not be returned until the following business day.  We are closed weekends and major holidays. You have access to a nurse at all times for urgent questions. Please call the main number to the clinic [phone] and follow the prompts.  For any non-urgent questions, you may also contact your provider using MyChart. We now offer e-Visits for anyone 56 and older to request care online for non-urgent symptoms. For details visit [email or web address].   Also download the MyChart app! Go to the app store, search "MyChart", open the app, select , and log in with your MyChart username and password.

## 2023-11-15 ENCOUNTER — Encounter: Payer: Self-pay | Admitting: Oncology

## 2023-11-16 ENCOUNTER — Encounter: Payer: Self-pay | Admitting: Oncology

## 2023-11-16 NOTE — Telephone Encounter (Signed)
 Error

## 2023-11-21 ENCOUNTER — Inpatient Hospital Stay: Payer: Medicare HMO | Attending: Oncology

## 2023-11-21 ENCOUNTER — Encounter: Payer: Self-pay | Admitting: Nurse Practitioner

## 2023-11-21 ENCOUNTER — Inpatient Hospital Stay (HOSPITAL_BASED_OUTPATIENT_CLINIC_OR_DEPARTMENT_OTHER): Payer: Medicare HMO | Attending: Oncology | Admitting: Nurse Practitioner

## 2023-11-21 ENCOUNTER — Telehealth: Payer: Self-pay | Admitting: Oncology

## 2023-11-21 ENCOUNTER — Inpatient Hospital Stay: Payer: Medicare HMO

## 2023-11-21 VITALS — BP 160/74 | HR 60 | Temp 97.2°F | Wt 172.1 lb

## 2023-11-21 DIAGNOSIS — T451X5A Adverse effect of antineoplastic and immunosuppressive drugs, initial encounter: Secondary | ICD-10-CM | POA: Insufficient documentation

## 2023-11-21 DIAGNOSIS — Z87891 Personal history of nicotine dependence: Secondary | ICD-10-CM | POA: Insufficient documentation

## 2023-11-21 DIAGNOSIS — Z79630 Long term (current) use of alkylating agent: Secondary | ICD-10-CM | POA: Diagnosis not present

## 2023-11-21 DIAGNOSIS — C3490 Malignant neoplasm of unspecified part of unspecified bronchus or lung: Secondary | ICD-10-CM | POA: Diagnosis not present

## 2023-11-21 DIAGNOSIS — Z95828 Presence of other vascular implants and grafts: Secondary | ICD-10-CM

## 2023-11-21 DIAGNOSIS — Z5111 Encounter for antineoplastic chemotherapy: Secondary | ICD-10-CM | POA: Insufficient documentation

## 2023-11-21 DIAGNOSIS — Z83438 Family history of other disorder of lipoprotein metabolism and other lipidemia: Secondary | ICD-10-CM | POA: Insufficient documentation

## 2023-11-21 DIAGNOSIS — E876 Hypokalemia: Secondary | ICD-10-CM

## 2023-11-21 DIAGNOSIS — E119 Type 2 diabetes mellitus without complications: Secondary | ICD-10-CM | POA: Insufficient documentation

## 2023-11-21 DIAGNOSIS — Z803 Family history of malignant neoplasm of breast: Secondary | ICD-10-CM | POA: Insufficient documentation

## 2023-11-21 DIAGNOSIS — Z5189 Encounter for other specified aftercare: Secondary | ICD-10-CM | POA: Diagnosis not present

## 2023-11-21 DIAGNOSIS — C3412 Malignant neoplasm of upper lobe, left bronchus or lung: Secondary | ICD-10-CM | POA: Diagnosis not present

## 2023-11-21 DIAGNOSIS — Z7901 Long term (current) use of anticoagulants: Secondary | ICD-10-CM | POA: Insufficient documentation

## 2023-11-21 DIAGNOSIS — C7931 Secondary malignant neoplasm of brain: Secondary | ICD-10-CM | POA: Insufficient documentation

## 2023-11-21 DIAGNOSIS — D701 Agranulocytosis secondary to cancer chemotherapy: Secondary | ICD-10-CM

## 2023-11-21 DIAGNOSIS — Z79899 Other long term (current) drug therapy: Secondary | ICD-10-CM | POA: Insufficient documentation

## 2023-11-21 DIAGNOSIS — Z8249 Family history of ischemic heart disease and other diseases of the circulatory system: Secondary | ICD-10-CM | POA: Diagnosis not present

## 2023-11-21 DIAGNOSIS — C779 Secondary and unspecified malignant neoplasm of lymph node, unspecified: Secondary | ICD-10-CM | POA: Insufficient documentation

## 2023-11-21 DIAGNOSIS — Z8349 Family history of other endocrine, nutritional and metabolic diseases: Secondary | ICD-10-CM | POA: Diagnosis not present

## 2023-11-21 DIAGNOSIS — C787 Secondary malignant neoplasm of liver and intrahepatic bile duct: Secondary | ICD-10-CM

## 2023-11-21 DIAGNOSIS — Z5982 Transportation insecurity: Secondary | ICD-10-CM | POA: Insufficient documentation

## 2023-11-21 DIAGNOSIS — Z86718 Personal history of other venous thrombosis and embolism: Secondary | ICD-10-CM | POA: Insufficient documentation

## 2023-11-21 DIAGNOSIS — Z90710 Acquired absence of both cervix and uterus: Secondary | ICD-10-CM | POA: Diagnosis not present

## 2023-11-21 DIAGNOSIS — D702 Other drug-induced agranulocytosis: Secondary | ICD-10-CM | POA: Insufficient documentation

## 2023-11-21 LAB — COMPREHENSIVE METABOLIC PANEL
ALT: 10 U/L (ref 0–44)
AST: 16 U/L (ref 15–41)
Albumin: 4.2 g/dL (ref 3.5–5.0)
Alkaline Phosphatase: 59 U/L (ref 38–126)
Anion gap: 9 (ref 5–15)
BUN: 13 mg/dL (ref 8–23)
CO2: 24 mmol/L (ref 22–32)
Calcium: 9.3 mg/dL (ref 8.9–10.3)
Chloride: 107 mmol/L (ref 98–111)
Creatinine, Ser: 0.72 mg/dL (ref 0.44–1.00)
GFR, Estimated: >60 mL/min (ref >60)
Glucose, Bld: 95 mg/dL (ref 70–99)
Potassium: 3.3 mmol/L — ABNORMAL LOW (ref 3.5–5.1)
Sodium: 140 mmol/L (ref 135–145)
Total Bilirubin: 0.7 mg/dL (ref 0.0–1.2)
Total Protein: 7.5 g/dL (ref 6.5–8.1)

## 2023-11-21 LAB — CBC WITH DIFFERENTIAL/PLATELET
Abs Immature Granulocytes: 0 10*3/uL (ref 0.00–0.07)
Basophils Absolute: 0 10*3/uL (ref 0.0–0.1)
Basophils Relative: 1 %
Eosinophils Absolute: 0 10*3/uL (ref 0.0–0.5)
Eosinophils Relative: 2 %
HCT: 35.1 % — ABNORMAL LOW (ref 36.0–46.0)
Hemoglobin: 11.7 g/dL — ABNORMAL LOW (ref 12.0–15.0)
Immature Granulocytes: 0 %
Lymphocytes Relative: 39 %
Lymphs Abs: 0.8 10*3/uL (ref 0.7–4.0)
MCH: 31.1 pg (ref 26.0–34.0)
MCHC: 33.3 g/dL (ref 30.0–36.0)
MCV: 93.4 fL (ref 80.0–100.0)
Monocytes Absolute: 0.3 10*3/uL (ref 0.1–1.0)
Monocytes Relative: 16 %
Neutro Abs: 0.9 10*3/uL — ABNORMAL LOW (ref 1.7–7.7)
Neutrophils Relative %: 42 %
Platelets: 222 10*3/uL (ref 150–400)
RBC: 3.76 MIL/uL — ABNORMAL LOW (ref 3.87–5.11)
RDW: 13.8 % (ref 11.5–15.5)
WBC: 2.2 10*3/uL — ABNORMAL LOW (ref 4.0–10.5)
nRBC: 0 % (ref 0.0–0.2)

## 2023-11-21 MED ORDER — HEPARIN SOD (PORK) LOCK FLUSH 100 UNIT/ML IV SOLN
500.0000 [IU] | Freq: Once | INTRAVENOUS | Status: AC
  Administered 2023-11-21: 500 [IU] via INTRAVENOUS
  Filled 2023-11-21: qty 5

## 2023-11-21 NOTE — Telephone Encounter (Signed)
 Called patient to inform of rescheduled appointments due to no treatment today- X3- the number says call can not be completed. Mailed AVS- BC

## 2023-11-21 NOTE — Progress Notes (Signed)
 Hematology/Oncology Consult Note [facility]  Telephone:([phone] [phone]  Patient Care Team: Epifanio Alm SQUIBB, MD as PCP - General (Infectious Diseases) Verdene Gills, RN as Oncology Nurse Navigator Melanee Annah BROCKS, MD as Consulting Physician (Hematology and Oncology)   Name of the patient: Doris Johnson  [id number]  09-Sep-1951   Date of visit: 11/21/23  Diagnosis- extensive stage small cell lung cancer with liver lymph node and brain metastases   Chief complaint/ Reason for visit-on treatment assessment prior to cycle 23 of lurbinectedin   Heme/Onc history:  Patient is a 73 year old female with a remote history of smoking in her teenage years and exposure to passive smoking.  She finally had a CT chest with contrast done in October 2021 which showed a large mass in the left side of the mediastinum measuring 6.7 x 6.2 x 6.1 cm causing severe compression of the left main pulmonary artery and around the aortic arch in the left subclavian artery.  Enlarged thyroid  gland.  Left upper lobe nodule 1 x 0.7 cm.  She then underwent bronchoscopy with Dr.Aleskerov left upper lobe biopsy was nondiagnostic.  However station 4, 7 and 10 L lymph nodes were consistent with metastatic small cell carcinoma.   PET CT scan showed enlarged level 3 cervical lymph node 2.2 cm that was hypermetabolic with an SUV of 9.7.  Large central 7.3 cm AP window mass.  5.7 cm left liver mass.  MRI brain with and without contrast showed 2 small foci of enhancement in the right cerebellar hemisphere and right temporal lobe without mass-effect or edema as well concerning for brain metastases   Patient had 2 cycles of carbo etoposide  chemotherapy along with Tecentriq  and subsequent scan showed no significant improvement in the primary lung mass or liver mass.  She received radiation treatment to her primary lung mass and also seen by [facility] for second opinion.  Her pathology was reviewed at  [facility] and reported as possible neuroendocrine neoplasm But stated that small cell carcinoma cannot be excluded but not definitely diagnostic for it.  However [facility] pathology feels that this is consistent with small cell lung cancer.  She has completed 4 cycles of carbo etoposide  Tecentriq  chemotherapy and is currently on maintenance Tecentriq .  Repeat liver biopsy was also performed and was consistent with small cell lung cancer   Disease progression after 23 cycles of maintenance Tecentriq  in the left supraclavicular lymph node region.  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  Plan to switch patient to second line lurbinectedin  since November 2023      Interval history-patient is tolerating lurbinectedin  well without any significant side effects.  Continues to have a good appetite and quality of life. Breathing is good. Denies pain. Has some fatigue but not worsening.   ECOG PS- 1 Pain scale- 0   Review of systems- Review of Systems  Constitutional:  Negative for chills, fever, malaise/fatigue and weight loss.  HENT:  Negative for congestion, ear discharge and nosebleeds.   Eyes:  Negative for blurred vision.  Respiratory:  Negative for cough, hemoptysis, sputum production, shortness of breath and wheezing.   Cardiovascular:  Negative for chest pain, palpitations, orthopnea and claudication.  Gastrointestinal:  Negative for abdominal pain, blood in stool, constipation, diarrhea, heartburn, melena, nausea and vomiting.  Genitourinary:  Negative for dysuria, flank pain, frequency, hematuria and urgency.  Musculoskeletal:  Negative for back pain, joint pain and myalgias.  Skin:  Negative for rash.  Neurological:  Negative for dizziness, tingling,  focal weakness, seizures, weakness and headaches.  Endo/Heme/Allergies:  Does not bruise/bleed easily.  Psychiatric/Behavioral:  Negative for depression and suicidal ideas. The patient does not have insomnia.       No Known Allergies   Past Medical History:  Diagnosis Date   Cancer (HCC)    Cataract    Diabetes mellitus without complication (HCC)    Hyperlipidemia    Hypertension    Hypothyroidism    doctor's keeping an eye on thyroid     Lung cancer [facility])      Past Surgical History:  Procedure Laterality Date   ABDOMINAL HYSTERECTOMY     IR CV LINE INJECTION  09/28/2021   MYRINGOTOMY WITH TUBE PLACEMENT Right 12/29/2022   Procedure: MYRINGOTOMY WITH TUBE PLACEMENT;  Surgeon: Juengel, Paul, MD;  Location: [facility];  Service: ENT;  Laterality: Right;  Diabetic   PORTA CATH INSERTION N/A 11/30/2020   Procedure: PORTA CATH INSERTION;  Surgeon: Marea Selinda RAMAN, MD;  Location: [facility] INVASIVE CV LAB;  Service: Cardiovascular;  Laterality: N/A;   VIDEO BRONCHOSCOPY WITH ENDOBRONCHIAL NAVIGATION N/A 10/21/2020   Procedure: VIDEO BRONCHOSCOPY WITH ENDOBRONCHIAL NAVIGATION;  Surgeon: Parris Manna, MD;  Location: [facility] ORS;  Service: Thoracic;  Laterality: N/A;   VIDEO BRONCHOSCOPY WITH ENDOBRONCHIAL ULTRASOUND N/A 10/21/2020   Procedure: VIDEO BRONCHOSCOPY WITH ENDOBRONCHIAL ULTRASOUND;  Surgeon: Parris Manna, MD;  Location: [facility] ORS;  Service: Thoracic;  Laterality: N/A;    Social History   Socioeconomic History   Marital status: Single    Spouse name: Not on file   Number of children: Not on file   Years of education: Not on file   Highest education level: Not on file  Occupational History   Not on file  Tobacco Use   Smoking status: Never   Smokeless tobacco: Never  Vaping Use   Vaping status: Never Used  Substance and Sexual Activity   Alcohol use: No   Drug use: No   Sexual activity: Not Currently  Other Topics Concern   Not on file  Social History Narrative   Not on file   Social Drivers of Health   Financial Resource Strain: Low Risk  (03/22/2023)   Received from [facility], [facility]   Overall Financial Resource Strain  (CARDIA)    Difficulty of Paying Living Expenses: Not hard at all  Food Insecurity: No Food Insecurity (03/22/2023)   Received from [facility], [facility]   Hunger Vital Sign    Worried About Running Out of Food in the Last Year: Never true    Ran Out of Food in the Last Year: Never true  Transportation Needs: Unmet Transportation Needs (03/28/2023)   PRAPARE - Administrator, Civil Service (Medical): Yes    Lack of Transportation (Non-Medical): Yes  Physical Activity: Not on file  Stress: Not on file  Social Connections: Not on file  Intimate Partner Violence: Not on file    Family History  Problem Relation Age of Onset   Cancer Mother        breast   Hypertension Mother    Breast cancer Mother 47   Heart disease Father    Hyperlipidemia Brother    Heart disease Brother      Current Outpatient Medications:    apixaban  (ELIQUIS ) 5 MG TABS tablet, Take 1 tablet (5 mg total) by mouth 2 (two) times daily., Disp: 60 tablet, Rfl: 1   fluticasone  (FLONASE ) 50 MCG/ACT nasal spray,  Place 2 sprays into both nostrils daily., Disp: 16 g, Rfl: 5   lidocaine -prilocaine  (EMLA ) cream, Apply 1 Application topically as needed. Apply small amount to port site at least 1 hour prior to it being accessed, cover with plastic wrap, Disp: 30 g, Rfl: 1   losartan (COZAAR) 25 MG tablet, Take 1 tablet by mouth daily., Disp: , Rfl:    metFORMIN (GLUCOPHAGE-XR) 500 MG 24 hr tablet, Take by mouth., Disp: , Rfl:    potassium chloride  SA (KLOR-CON  M) 20 MEQ tablet, TAKE 1 TABLET(20 MEQ) BY MOUTH DAILY, Disp: 90 tablet, Rfl: 2 No current facility-administered medications for this visit.  Facility-Administered Medications Ordered in Other Visits:    sodium chloride  flush (NS) 0.9 % injection 10 mL, 10 mL, Intravenous, PRN, Melanee Annah BROCKS, MD, 10 mL at 12/15/20 0850   sodium chloride  flush (NS) 0.9 % injection 10 mL, 10 mL, Intravenous, PRN, Melanee Annah BROCKS, MD, 10  mL at 03/07/23 0923  Physical exam:  Vitals:   11/21/23 0916  BP: (!) 160/74  Pulse: 60  Temp: (!) 97.2 F (36.2 C)  TempSrc: Tympanic  SpO2: 100%  Weight: 172 lb 1.6 oz (78.1 kg)   Physical Exam Constitutional:      Comments: Poor dentition  Cardiovascular:     Rate and Rhythm: Normal rate and regular rhythm.  Pulmonary:     Effort: Pulmonary effort is normal.     Breath sounds: Normal breath sounds.  Skin:    General: Skin is warm and dry.     Coloration: Skin is not pale.  Neurological:     Mental Status: She is alert and oriented to person, place, and time.  Psychiatric:        Mood and Affect: Mood normal.        Behavior: Behavior normal.        Latest Ref Rng & Units 11/21/2023    8:56 AM  CMP  Glucose 70 - 99 mg/dL 95   BUN 8 - 23 mg/dL 13   Creatinine 9.55 - 1.00 mg/dL 9.27   Sodium 864 - 854 mmol/L 140   Potassium 3.5 - 5.1 mmol/L 3.3   Chloride 98 - 111 mmol/L 107   CO2 22 - 32 mmol/L 24   Calcium 8.9 - 10.3 mg/dL 9.3   Total Protein 6.5 - 8.1 g/dL 7.5   Total Bilirubin 0.0 - 1.2 mg/dL 0.7   Alkaline Phos 38 - 126 U/L 59   AST 15 - 41 U/L 16   ALT 0 - 44 U/L 10       Latest Ref Rng & Units 11/21/2023    8:56 AM  CBC  WBC 4.0 - 10.5 K/uL 2.2   Hemoglobin 12.0 - 15.0 g/dL 88.2   Hematocrit 63.9 - 46.0 % 35.1   Platelets 150 - 400 K/uL 222     Assessment and plan- Patient is a 73 y.o. female with extensive stage small cell lung cancer with lymph node and brain metastases.  She is here for on treatment assessment prior to cycle 23 of lurbinectedin   ANC 0.9. Afebrile. She received Neulasta  On Pro. Hold lurbinectedin . She will rtc next week for reconsideration of treatment.   Plan is to continue lurbinectedin  until progression or toxicity  Chronic hypokalemia: Continue oral potassium  History of renal vein thrombosis: Continue Eliquis    Visit Diagnosis 1. Small cell carcinoma of lung metastatic to liver (HCC)   2. Encounter for antineoplastic  chemotherapy   3. Chemotherapy induced  neutropenia (HCC)   4. Hypokalemia     Tinnie Dawn, DNP, AGNP-C, [facility] at [facility] [phone] (clinic) 11/21/2023

## 2023-11-23 ENCOUNTER — Encounter: Payer: Self-pay | Admitting: Oncology

## 2023-11-27 ENCOUNTER — Telehealth: Payer: Self-pay | Admitting: *Deleted

## 2023-11-27 NOTE — Telephone Encounter (Signed)
 FMLA for [employer] received, completed and sent for doctor signature

## 2023-11-28 ENCOUNTER — Inpatient Hospital Stay: Payer: Medicare HMO

## 2023-11-28 ENCOUNTER — Inpatient Hospital Stay (HOSPITAL_BASED_OUTPATIENT_CLINIC_OR_DEPARTMENT_OTHER): Payer: Medicare HMO | Admitting: Nurse Practitioner

## 2023-11-28 ENCOUNTER — Encounter: Payer: Self-pay | Admitting: Oncology

## 2023-11-28 ENCOUNTER — Encounter: Payer: Self-pay | Admitting: Nurse Practitioner

## 2023-11-28 VITALS — BP 140/81 | HR 66 | Temp 96.4°F | Wt 172.0 lb

## 2023-11-28 VITALS — Resp 18

## 2023-11-28 DIAGNOSIS — D701 Agranulocytosis secondary to cancer chemotherapy: Secondary | ICD-10-CM | POA: Diagnosis not present

## 2023-11-28 DIAGNOSIS — E119 Type 2 diabetes mellitus without complications: Secondary | ICD-10-CM | POA: Diagnosis not present

## 2023-11-28 DIAGNOSIS — Z5189 Encounter for other specified aftercare: Secondary | ICD-10-CM | POA: Diagnosis not present

## 2023-11-28 DIAGNOSIS — D702 Other drug-induced agranulocytosis: Secondary | ICD-10-CM | POA: Diagnosis not present

## 2023-11-28 DIAGNOSIS — C7931 Secondary malignant neoplasm of brain: Secondary | ICD-10-CM | POA: Diagnosis not present

## 2023-11-28 DIAGNOSIS — C779 Secondary and unspecified malignant neoplasm of lymph node, unspecified: Secondary | ICD-10-CM | POA: Diagnosis not present

## 2023-11-28 DIAGNOSIS — C3490 Malignant neoplasm of unspecified part of unspecified bronchus or lung: Secondary | ICD-10-CM | POA: Diagnosis not present

## 2023-11-28 DIAGNOSIS — C787 Secondary malignant neoplasm of liver and intrahepatic bile duct: Secondary | ICD-10-CM | POA: Diagnosis not present

## 2023-11-28 DIAGNOSIS — T451X5A Adverse effect of antineoplastic and immunosuppressive drugs, initial encounter: Secondary | ICD-10-CM | POA: Diagnosis not present

## 2023-11-28 DIAGNOSIS — Z5111 Encounter for antineoplastic chemotherapy: Secondary | ICD-10-CM | POA: Diagnosis not present

## 2023-11-28 DIAGNOSIS — C3412 Malignant neoplasm of upper lobe, left bronchus or lung: Secondary | ICD-10-CM | POA: Diagnosis not present

## 2023-11-28 DIAGNOSIS — E876 Hypokalemia: Secondary | ICD-10-CM | POA: Diagnosis not present

## 2023-11-28 LAB — CBC WITH DIFFERENTIAL/PLATELET
Abs Immature Granulocytes: 0.01 10*3/uL (ref 0.00–0.07)
Basophils Absolute: 0 10*3/uL (ref 0.0–0.1)
Basophils Relative: 0 %
Eosinophils Absolute: 0.1 10*3/uL (ref 0.0–0.5)
Eosinophils Relative: 4 %
HCT: 35.7 % — ABNORMAL LOW (ref 36.0–46.0)
Hemoglobin: 11.7 g/dL — ABNORMAL LOW (ref 12.0–15.0)
Immature Granulocytes: 0 %
Lymphocytes Relative: 35 %
Lymphs Abs: 0.9 10*3/uL (ref 0.7–4.0)
MCH: 30.6 pg (ref 26.0–34.0)
MCHC: 32.8 g/dL (ref 30.0–36.0)
MCV: 93.5 fL (ref 80.0–100.0)
Monocytes Absolute: 0.3 10*3/uL (ref 0.1–1.0)
Monocytes Relative: 13 %
Neutro Abs: 1.2 10*3/uL — ABNORMAL LOW (ref 1.7–7.7)
Neutrophils Relative %: 48 %
Platelets: 218 10*3/uL (ref 150–400)
RBC: 3.82 MIL/uL — ABNORMAL LOW (ref 3.87–5.11)
RDW: 13.7 % (ref 11.5–15.5)
WBC: 2.6 10*3/uL — ABNORMAL LOW (ref 4.0–10.5)
nRBC: 0 % (ref 0.0–0.2)

## 2023-11-28 LAB — COMPREHENSIVE METABOLIC PANEL
ALT: 10 U/L (ref 0–44)
AST: 15 U/L (ref 15–41)
Albumin: 4 g/dL (ref 3.5–5.0)
Alkaline Phosphatase: 55 U/L (ref 38–126)
Anion gap: 7 (ref 5–15)
BUN: 13 mg/dL (ref 8–23)
CO2: 25 mmol/L (ref 22–32)
Calcium: 9.6 mg/dL (ref 8.9–10.3)
Chloride: 106 mmol/L (ref 98–111)
Creatinine, Ser: 0.6 mg/dL (ref 0.44–1.00)
GFR, Estimated: >60 mL/min (ref >60)
Glucose, Bld: 84 mg/dL (ref 70–99)
Potassium: 3.4 mmol/L — ABNORMAL LOW (ref 3.5–5.1)
Sodium: 138 mmol/L (ref 135–145)
Total Bilirubin: 0.6 mg/dL (ref 0.0–1.2)
Total Protein: 7.4 g/dL (ref 6.5–8.1)

## 2023-11-28 MED ORDER — DEXAMETHASONE SODIUM PHOSPHATE 10 MG/ML IJ SOLN
10.0000 mg | Freq: Once | INTRAMUSCULAR | Status: AC
Start: 2023-11-28 — End: 2023-11-28
  Administered 2023-11-28: 10 mg via INTRAVENOUS
  Filled 2023-11-28: qty 1

## 2023-11-28 MED ORDER — SODIUM CHLORIDE 0.9 % IV SOLN
Freq: Once | INTRAVENOUS | Status: AC
  Filled 2023-11-28: qty 250

## 2023-11-28 MED ORDER — HEPARIN SOD (PORK) LOCK FLUSH 100 UNIT/ML IV SOLN
500.0000 [IU] | Freq: Once | INTRAVENOUS | Status: AC | PRN
  Administered 2023-11-28: 500 [IU]
  Filled 2023-11-28: qty 5

## 2023-11-28 MED ORDER — SODIUM CHLORIDE 0.9% FLUSH
10.0000 mL | INTRAVENOUS | Status: DC | PRN
  Administered 2023-11-28: 10 mL via INTRAVENOUS
  Filled 2023-11-28: qty 10

## 2023-11-28 MED ORDER — PALONOSETRON HCL INJECTION 0.25 MG/5ML
0.2500 mg | Freq: Once | INTRAVENOUS | Status: AC
  Administered 2023-11-28: 0.25 mg via INTRAVENOUS
  Filled 2023-11-28: qty 5

## 2023-11-28 MED ORDER — PEGFILGRASTIM 6 MG/0.6ML ~~LOC~~ PSKT
6.0000 mg | PREFILLED_SYRINGE | Freq: Once | SUBCUTANEOUS | Status: AC
  Administered 2023-11-28: 6 mg via SUBCUTANEOUS
  Filled 2023-11-28: qty 0.6

## 2023-11-28 MED ORDER — SODIUM CHLORIDE 0.9 % IV SOLN
2.5600 mg/m2 | Freq: Once | INTRAVENOUS | Status: AC
  Administered 2023-11-28: 4.6 mg via INTRAVENOUS
  Filled 2023-11-28: qty 9.2

## 2023-11-28 NOTE — Progress Notes (Signed)
 Hematology/Oncology Consult Note [facility]  [phone] [phone]  Patient Care Team: Epifanio Alm SQUIBB, MD as PCP - General (Infectious Diseases) Verdene Gills, RN as Oncology Nurse Navigator Melanee Annah BROCKS, MD as Consulting Physician (Hematology and Oncology)   Name of the patient: Doris Johnson  [id number]  14-Feb-1951   Date of visit: 11/28/23  Diagnosis- extensive stage small cell lung cancer with liver lymph node and brain metastases   Chief complaint/ Reason for visit-on treatment assessment prior to cycle 23 of lurbinectedin   Heme/Onc history:  Patient is a 73 year old female with a remote history of smoking in her teenage years and exposure to passive smoking.  She finally had a CT chest with contrast done in October 2021 which showed a large mass in the left side of the mediastinum measuring 6.7 x 6.2 x 6.1 cm causing severe compression of the left main pulmonary artery and around the aortic arch in the left subclavian artery.  Enlarged thyroid  gland.  Left upper lobe nodule 1 x 0.7 cm.  She then underwent bronchoscopy with Dr.Aleskerov left upper lobe biopsy was nondiagnostic.  However station 4, 7 and 10 L lymph nodes were consistent with metastatic small cell carcinoma.   PET CT scan showed enlarged level 3 cervical lymph node 2.2 cm that was hypermetabolic with an SUV of 9.7.  Large central 7.3 cm AP window mass.  5.7 cm left liver mass.  MRI brain with and without contrast showed 2 small foci of enhancement in the right cerebellar hemisphere and right temporal lobe without mass-effect or edema as well concerning for brain metastases   Patient had 2 cycles of carbo etoposide  chemotherapy along with Tecentriq  and subsequent scan showed no significant improvement in the primary lung mass or liver mass.  She received radiation treatment to her primary lung mass and also seen by [facility] for second opinion.  Her pathology was reviewed at  [facility] and reported as possible neuroendocrine neoplasm But stated that small cell carcinoma cannot be excluded but not definitely diagnostic for it.  However [facility] pathology feels that this is consistent with small cell lung cancer.  She has completed 4 cycles of carbo etoposide  Tecentriq  chemotherapy and is currently on maintenance Tecentriq .  Repeat liver biopsy was also performed and was consistent with small cell lung cancer   Disease progression after 23 cycles of maintenance Tecentriq  in the left supraclavicular lymph node region.  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  Plan to switch patient to second line lurbinectedin  since November 2023      Interval history-patient is tolerating lurbinectedin  well without any significant side effects.  Continues to have a good appetite and quality of life. Breathing is good. Denies pain. Has some fatigue but not worsening. Treatment was held last week due to neutropenia. No interval fevers or infections.   ECOG PS- 1 Pain scale- 0   Review of systems- Review of Systems  Constitutional:  Negative for chills, fever, malaise/fatigue and weight loss.  HENT:  Negative for congestion, ear discharge and nosebleeds.   Eyes:  Negative for blurred vision.  Respiratory:  Negative for cough, hemoptysis, sputum production, shortness of breath and wheezing.   Cardiovascular:  Negative for chest pain, palpitations, orthopnea and claudication.  Gastrointestinal:  Negative for abdominal pain, blood in stool, constipation, diarrhea, heartburn, melena, nausea and vomiting.  Genitourinary:  Negative for dysuria, flank pain, frequency, hematuria and urgency.  Musculoskeletal:  Negative for back pain, joint pain and myalgias.  Skin:  Negative for rash.  Neurological:  Negative for dizziness, tingling, focal weakness, seizures, weakness and headaches.  Endo/Heme/Allergies:  Does not bruise/bleed easily.  Psychiatric/Behavioral:   Negative for depression and suicidal ideas. The patient does not have insomnia.      No Known Allergies   Past Medical History:  Diagnosis Date   Cancer (HCC)    Cataract    Diabetes mellitus without complication (HCC)    Hyperlipidemia    Hypertension    Hypothyroidism    doctor's keeping an eye on thyroid     Lung cancer [facility])     Past Surgical History:  Procedure Laterality Date   ABDOMINAL HYSTERECTOMY     IR CV LINE INJECTION  09/28/2021   MYRINGOTOMY WITH TUBE PLACEMENT Right 12/29/2022   Procedure: MYRINGOTOMY WITH TUBE PLACEMENT;  Surgeon: Juengel, Paul, MD;  Location: [facility];  Service: ENT;  Laterality: Right;  Diabetic   PORTA CATH INSERTION N/A 11/30/2020   Procedure: PORTA CATH INSERTION;  Surgeon: Marea Selinda RAMAN, MD;  Location: [facility] INVASIVE CV LAB;  Service: Cardiovascular;  Laterality: N/A;   VIDEO BRONCHOSCOPY WITH ENDOBRONCHIAL NAVIGATION N/A 10/21/2020   Procedure: VIDEO BRONCHOSCOPY WITH ENDOBRONCHIAL NAVIGATION;  Surgeon: Parris Manna, MD;  Location: [facility] ORS;  Service: Thoracic;  Laterality: N/A;   VIDEO BRONCHOSCOPY WITH ENDOBRONCHIAL ULTRASOUND N/A 10/21/2020   Procedure: VIDEO BRONCHOSCOPY WITH ENDOBRONCHIAL ULTRASOUND;  Surgeon: Parris Manna, MD;  Location: [facility] ORS;  Service: Thoracic;  Laterality: N/A;    Social History   Socioeconomic History   Marital status: Single    Spouse name: Not on file   Number of children: Not on file   Years of education: Not on file   Highest education level: Not on file  Occupational History   Not on file  Tobacco Use   Smoking status: Never   Smokeless tobacco: Never  Vaping Use   Vaping status: Never Used  Substance and Sexual Activity   Alcohol use: No   Drug use: No   Sexual activity: Not Currently  Other Topics Concern   Not on file  Social History Narrative   Not on file   Social Drivers of Health   Financial Resource Strain: Low Risk  (03/22/2023)   [facility], [facility]   Overall Financial Resource Strain (CARDIA)    Difficulty of Paying Living Expenses: Not hard at all  Food Insecurity: No Food Insecurity (03/22/2023)   Received from [facility], [facility]   Hunger Vital Sign    Worried About Running Out of Food in the Last Year: Never true    Ran Out of Food in the Last Year: Never true  Transportation Needs: Unmet Transportation Needs (03/28/2023)   PRAPARE - Administrator, Civil Service (Medical): Yes    Lack of Transportation (Non-Medical): Yes  Physical Activity: Not on file  Stress: Not on file  Social Connections: Not on file  Intimate Partner Violence: Not on file    Family History  Problem Relation Age of Onset   Cancer Mother        breast   Hypertension Mother    Breast cancer Mother 81   Heart disease Father    Hyperlipidemia Brother    Heart disease Brother      Current Outpatient Medications:    apixaban  (ELIQUIS ) 5 MG TABS tablet, Take 1 tablet (5 mg total) by mouth 2 (two) times daily., Disp: 60  tablet, Rfl: 1   fluticasone  (FLONASE ) 50 MCG/ACT nasal spray, Place 2 sprays into both nostrils daily., Disp: 16 g, Rfl: 5   furosemide  (LASIX ) 20 MG tablet, Take 20 mg by mouth., Disp: , Rfl:    lidocaine -prilocaine  (EMLA ) cream, Apply 1 Application topically as needed. Apply small amount to port site at least 1 hour prior to it being accessed, cover with plastic wrap, Disp: 30 g, Rfl: 1   losartan (COZAAR) 25 MG tablet, Take 1 tablet by mouth daily., Disp: , Rfl:    metFORMIN (GLUCOPHAGE-XR) 500 MG 24 hr tablet, Take by mouth., Disp: , Rfl:    mirtazapine (REMERON SOL-TAB) 15 MG disintegrating tablet, Take 15 mg by mouth at bedtime., Disp: , Rfl:    potassium chloride  SA (KLOR-CON  M) 20 MEQ tablet, TAKE 1 TABLET(20 MEQ) BY MOUTH DAILY, Disp: 90 tablet, Rfl: 2   traZODone (DESYREL) 50 MG tablet, Take 50 mg by mouth at bedtime., Disp: , Rfl:   No current facility-administered medications for this visit.  Facility-Administered Medications Ordered in Other Visits:    sodium chloride  flush (NS) 0.9 % injection 10 mL, 10 mL, Intravenous, PRN, Melanee Annah BROCKS, MD, 10 mL at 12/15/20 0850   sodium chloride  flush (NS) 0.9 % injection 10 mL, 10 mL, Intravenous, PRN, Melanee Annah BROCKS, MD, 10 mL at 03/07/23 0923   sodium chloride  flush (NS) 0.9 % injection 10 mL, 10 mL, Intravenous, PRN, Melanee Annah BROCKS, MD, 10 mL at 11/28/23 1022  Physical exam:  Vitals:   11/28/23 1038  BP: (!) 140/81  Pulse: 66  Temp: (!) 96.4 F (35.8 C)  TempSrc: Tympanic  SpO2: 100%  Weight: 172 lb (78 kg)   Physical Exam Vitals reviewed.  Constitutional:      Comments: Poor dentition. Accompanied.   Cardiovascular:     Rate and Rhythm: Normal rate and regular rhythm.  Pulmonary:     Effort: Pulmonary effort is normal.     Breath sounds: Normal breath sounds.  Skin:    General: Skin is warm and dry.     Coloration: Skin is not pale.  Neurological:     Mental Status: She is alert and oriented to person, place, and time.  Psychiatric:        Mood and Affect: Mood normal.        Behavior: Behavior normal.        Latest Ref Rng & Units 11/28/2023   10:22 AM  CMP  Glucose 70 - 99 mg/dL 84   BUN 8 - 23 mg/dL 13   Creatinine 9.55 - 1.00 mg/dL 9.39   Sodium 864 - 854 mmol/L 138   Potassium 3.5 - 5.1 mmol/L 3.4   Chloride 98 - 111 mmol/L 106   CO2 22 - 32 mmol/L 25   Calcium 8.9 - 10.3 mg/dL 9.6   Total Protein 6.5 - 8.1 g/dL 7.4   Total Bilirubin 0.0 - 1.2 mg/dL 0.6   Alkaline Phos 38 - 126 U/L 55   AST 15 - 41 U/L 15   ALT 0 - 44 U/L 10       Latest Ref Rng & Units 11/28/2023   10:22 AM  CBC  WBC 4.0 - 10.5 K/uL 2.6   Hemoglobin 12.0 - 15.0 g/dL 88.2   Hematocrit 63.9 - 46.0 % 35.7   Platelets 150 - 400 K/uL 218     Assessment and plan- Patient is a 73 y.o. female with   Extensive stage small cell lung cancer  with lymph node and brain  metastases- on second line lurbinectedin . Tolerating well.  Treatment assessment prior to cycle 23 of lurbinectedin - treatment held last week d/t Neutropenia. Today, ANC 1.2. Proceed with treatment.  Neutropenia- d/t chemotherapy. She receives Neulasta  On Pro. Given ongoing neutropenia, we will plan to extend her treatments form 3 weeks to 4 weeks to allow for count recovery. Discussed with Dr. Melanee.  Goals of care- Plan is to continue lurbinectedin  until progression or toxicity Chronic hypokalemia: Continue oral potassium History of renal vein thrombosis: Continue Eliquis   Disposition:  Lurbinectedin  today with neulasta  on pro 4 weeks- lab, Dr Melanee, lurbinectedin  & neulasta  on pro- la   Visit Diagnosis 1. Encounter for antineoplastic chemotherapy   2. Small cell carcinoma of lung metastatic to liver (HCC)   3. Chemotherapy induced neutropenia (HCC)     Tinnie Dawn, DNP, AGNP-C, [facility] at [facility] [phone] (clinic) 11/28/2023

## 2023-11-28 NOTE — Telephone Encounter (Signed)
 FMLA completed and faxed to [facility] confirmation of receipt received copied for chart.

## 2023-11-28 NOTE — Patient Instructions (Signed)
 [facility] - A DEPT OF [facility]  Discharge Instructions: Thank you for choosing [facility] to provide your oncology and hematology care.  If you have a lab appointment with the Cancer Center, please go directly to the Cancer Center and check in at the registration area.  Wear comfortable clothing and clothing appropriate for easy access to any Portacath or PICC line.   We strive to give you quality time with your provider. You may need to reschedule your appointment if you arrive late (15 or more minutes).  Arriving late affects you and other patients whose appointments are after yours.  Also, if you miss three or more appointments without notifying the office, you may be dismissed from the clinic at the provider's discretion.      For prescription refill requests, have your pharmacy contact our office and allow 72 hours for refills to be completed.    Today you received the following chemotherapy and/or immunotherapy agents ZEPZELCA      To help prevent nausea and vomiting after your treatment, we encourage you to take your nausea medication as directed.  BELOW ARE SYMPTOMS THAT SHOULD BE REPORTED IMMEDIATELY: *FEVER GREATER THAN 100.4 F (38 C) OR HIGHER *CHILLS OR SWEATING *NAUSEA AND VOMITING THAT IS NOT CONTROLLED WITH YOUR NAUSEA MEDICATION *UNUSUAL SHORTNESS OF BREATH *UNUSUAL BRUISING OR BLEEDING *URINARY PROBLEMS (pain or burning when urinating, or frequent urination) *BOWEL PROBLEMS (unusual diarrhea, constipation, pain near the anus) TENDERNESS IN MOUTH AND THROAT WITH OR WITHOUT PRESENCE OF ULCERS (sore throat, sores in mouth, or a toothache) UNUSUAL RASH, SWELLING OR PAIN  UNUSUAL VAGINAL DISCHARGE OR ITCHING   Items with * indicate a potential emergency and should be followed up as soon as possible or go to the Emergency Department if any problems should occur.  Please show the CHEMOTHERAPY ALERT CARD or IMMUNOTHERAPY  ALERT CARD at check-in to the Emergency Department and triage nurse.  Should you have questions after your visit or need to cancel or reschedule your appointment, please contact [facility] - A DEPT OF [facility]  [phone] and follow the prompts.  Office hours are 8:00 a.m. to 4:30 p.m. Monday - Friday. Please note that voicemails left after 4:00 p.m. may not be returned until the following business day.  We are closed weekends and major holidays. You have access to a nurse at all times for urgent questions. Please call the main number to the clinic [phone] and follow the prompts.  For any non-urgent questions, you may also contact your provider using MyChart. We now offer e-Visits for anyone 74 and older to request care online for non-urgent symptoms. For details visit [email or web address].   Also download the MyChart app! Go to the app store, search "MyChart", open the app, select [place], and log in with your MyChart username and password.  Lurbinectedin Injection What is this medication? LURBINECTEDIN (LOOR bin EK te din) treats lung cancer. It works by slowing down the growth of cancer cells. This medicine may be used for other purposes; ask your health care provider or pharmacist if you have questions. COMMON BRAND NAME(S): ZEPZELCA What should I tell my care team before I take this medication? They need to know if you have any of these conditions: Liver disease Low blood cell levels, such as low white cells, platelets, red blood cells An unusual or allergic reaction to lurbinectedin, other medications, foods, dyes, or  preservatives If you or your partner are pregnant or trying to get pregnant Breastfeeding How should I use this medication? This medication is injected into a vein. It is given by your care team in a hospital or clinic setting. Talk to your care team about the use of this medication in children. Special care may be  needed. Overdosage: If you think you have taken too much of this medicine contact a poison control center or emergency room at once. NOTE: This medicine is only for you. Do not share this medicine with others. What if I miss a dose? Keep appointments for follow-up doses. It is important not to miss your dose. Call your care team if you are unable to keep an appointment. What may interact with this medication? Grapefruit juice or [place] oranges Other medications may affect the way this medication works. Talk with your care team about all of the medications you take. They may suggest changes to your treatment plan to lower the risk of side effects and to make sure your medications work as intended. This list may not describe all possible interactions. Give your health care provider a list of all the medicines, herbs, non-prescription drugs, or dietary supplements you use. Also tell them if you smoke, drink alcohol, or use illegal drugs. Some items may interact with your medicine. What should I watch for while using this medication? Your condition will be monitored carefully while you are receiving this medication. This medication may make you feel generally unwell. This is not uncommon as chemotherapy can affect healthy cells as well as cancer cells. Report any side effects. Continue your course of treatment even though you feel ill unless your care team tells you to stop. This medication may increase your risk of getting an infection. Call your care team for advice if you get a fever, chills, sore throat, or other symptoms of a cold or flu. Do not treat yourself. Try to avoid being around people who are sick. Avoid taking medications that contain aspirin, acetaminophen, ibuprofen, naproxen, or ketoprofen unless instructed by your care team. These medications may hide a fever. Be careful brushing or flossing your teeth or using a toothpick because you may get an infection or bleed more easily. If you  have any dental work done, tell your dentist you are receiving this medication. Talk to your care team if you may be pregnant. Serious birth defects can occur if you take this medication during pregnancy and for 6 months after the last dose. You will need a negative pregnancy test before starting this medication. Contraception is recommended while taking this medication and for 6 months after the last dose. If your partner can get pregnant, use a condom during sex while taking this medication and for 4 months after the last dose. Do not breastfeed while taking this medication and for 2 weeks after the last dose. What side effects may I notice from receiving this medication? Side effects that you should report to your care team as soon as possible: Allergic reactions--skin rash, itching, hives, swelling of the face, lips, tongue, or throat Infection--fever, chills, cough, sore throat, wounds that don't heal, pain or trouble when passing urine, general feeling of discomfort or being unwell Liver injury--right upper belly pain, loss of appetite, nausea, light-colored stool, dark yellow or brown urine, yellowing skin or eyes, unusual weakness or fatigue Low red blood cell level--unusual weakness or fatigue, dizziness, headache, trouble breathing Muscle injury--unusual weakness or fatigue, muscle pain, dark yellow or brown urine,  decrease in amount of urine Painful swelling, warmth, or redness of the skin, blisters or sores at the infusion site Unusual bruising or bleeding Side effects that usually do not require medical attention (report these to your care team if they continue or are bothersome): Constipation Cough Diarrhea Fatigue Loss of appetite Nausea This list may not describe all possible side effects. Call your doctor for medical advice about side effects. You may report side effects to FDA at 1-800-FDA-1088. Where should I keep my medication? This medication is given in a hospital or  clinic. It will not be stored at home. NOTE: This sheet is a summary. It may not cover all possible information. If you have questions about this medicine, talk to your doctor, pharmacist, or health care provider.  2024 Elsevier/Gold Standard (2022-06-14 00:00:00)

## 2023-12-04 ENCOUNTER — Telehealth: Payer: Self-pay | Admitting: *Deleted

## 2023-12-04 NOTE — Telephone Encounter (Signed)
I spoke to the daughter on Friday and she needed to have the [facility] sent through email. I got the paper on Friday evening and then filled it out 1/20 and I sent it to her today at 2:17 pm and few minutes later she sent me a email to say thanks

## 2023-12-05 NOTE — Addendum Note (Signed)
Addended by: Corene Cornea on: 12/05/2023 02:50 PM   Modules accepted: Orders, Level of Service

## 2023-12-05 NOTE — Telephone Encounter (Deleted)
 This encounter was created in error - please disregard.

## 2023-12-05 NOTE — Telephone Encounter (Signed)
 In error

## 2023-12-12 ENCOUNTER — Ambulatory Visit: Payer: 59

## 2023-12-12 ENCOUNTER — Other Ambulatory Visit: Payer: 59

## 2023-12-12 ENCOUNTER — Ambulatory Visit: Payer: 59 | Admitting: Oncology

## 2023-12-19 ENCOUNTER — Inpatient Hospital Stay (HOSPITAL_BASED_OUTPATIENT_CLINIC_OR_DEPARTMENT_OTHER): Payer: Medicare HMO | Admitting: Oncology

## 2023-12-19 ENCOUNTER — Encounter: Payer: Self-pay | Admitting: Oncology

## 2023-12-19 ENCOUNTER — Inpatient Hospital Stay: Payer: Medicare HMO | Attending: Oncology

## 2023-12-19 ENCOUNTER — Inpatient Hospital Stay: Payer: Medicare HMO

## 2023-12-19 VITALS — BP 160/82 | HR 65 | Temp 95.9°F | Resp 19 | Wt 170.0 lb

## 2023-12-19 DIAGNOSIS — M25519 Pain in unspecified shoulder: Secondary | ICD-10-CM | POA: Insufficient documentation

## 2023-12-19 DIAGNOSIS — D701 Agranulocytosis secondary to cancer chemotherapy: Secondary | ICD-10-CM

## 2023-12-19 DIAGNOSIS — Z5982 Transportation insecurity: Secondary | ICD-10-CM | POA: Diagnosis not present

## 2023-12-19 DIAGNOSIS — C7931 Secondary malignant neoplasm of brain: Secondary | ICD-10-CM | POA: Insufficient documentation

## 2023-12-19 DIAGNOSIS — C770 Secondary and unspecified malignant neoplasm of lymph nodes of head, face and neck: Secondary | ICD-10-CM | POA: Diagnosis not present

## 2023-12-19 DIAGNOSIS — C787 Secondary malignant neoplasm of liver and intrahepatic bile duct: Secondary | ICD-10-CM | POA: Diagnosis not present

## 2023-12-19 DIAGNOSIS — E049 Nontoxic goiter, unspecified: Secondary | ICD-10-CM | POA: Insufficient documentation

## 2023-12-19 DIAGNOSIS — Z79630 Long term (current) use of alkylating agent: Secondary | ICD-10-CM | POA: Diagnosis not present

## 2023-12-19 DIAGNOSIS — E119 Type 2 diabetes mellitus without complications: Secondary | ICD-10-CM | POA: Insufficient documentation

## 2023-12-19 DIAGNOSIS — Z8249 Family history of ischemic heart disease and other diseases of the circulatory system: Secondary | ICD-10-CM | POA: Diagnosis not present

## 2023-12-19 DIAGNOSIS — T451X5A Adverse effect of antineoplastic and immunosuppressive drugs, initial encounter: Secondary | ICD-10-CM

## 2023-12-19 DIAGNOSIS — E876 Hypokalemia: Secondary | ICD-10-CM | POA: Insufficient documentation

## 2023-12-19 DIAGNOSIS — I10 Essential (primary) hypertension: Secondary | ICD-10-CM | POA: Insufficient documentation

## 2023-12-19 DIAGNOSIS — C3412 Malignant neoplasm of upper lobe, left bronchus or lung: Secondary | ICD-10-CM | POA: Diagnosis not present

## 2023-12-19 DIAGNOSIS — Z7901 Long term (current) use of anticoagulants: Secondary | ICD-10-CM | POA: Diagnosis not present

## 2023-12-19 DIAGNOSIS — Z5111 Encounter for antineoplastic chemotherapy: Secondary | ICD-10-CM | POA: Diagnosis not present

## 2023-12-19 DIAGNOSIS — Z86718 Personal history of other venous thrombosis and embolism: Secondary | ICD-10-CM | POA: Insufficient documentation

## 2023-12-19 DIAGNOSIS — Z87891 Personal history of nicotine dependence: Secondary | ICD-10-CM | POA: Insufficient documentation

## 2023-12-19 DIAGNOSIS — Z803 Family history of malignant neoplasm of breast: Secondary | ICD-10-CM | POA: Diagnosis not present

## 2023-12-19 DIAGNOSIS — Z83438 Family history of other disorder of lipoprotein metabolism and other lipidemia: Secondary | ICD-10-CM | POA: Insufficient documentation

## 2023-12-19 DIAGNOSIS — C3490 Malignant neoplasm of unspecified part of unspecified bronchus or lung: Secondary | ICD-10-CM | POA: Diagnosis not present

## 2023-12-19 DIAGNOSIS — Z79899 Other long term (current) drug therapy: Secondary | ICD-10-CM | POA: Diagnosis not present

## 2023-12-19 DIAGNOSIS — Z90710 Acquired absence of both cervix and uterus: Secondary | ICD-10-CM | POA: Diagnosis not present

## 2023-12-19 LAB — CBC WITH DIFFERENTIAL/PLATELET
Abs Immature Granulocytes: 0 10*3/uL (ref 0.00–0.07)
Basophils Absolute: 0 10*3/uL (ref 0.0–0.1)
Basophils Relative: 0 %
Eosinophils Absolute: 0 10*3/uL (ref 0.0–0.5)
Eosinophils Relative: 1 %
HCT: 37.2 % (ref 36.0–46.0)
Hemoglobin: 12.3 g/dL (ref 12.0–15.0)
Immature Granulocytes: 0 %
Lymphocytes Relative: 38 %
Lymphs Abs: 0.8 10*3/uL (ref 0.7–4.0)
MCH: 30.4 pg (ref 26.0–34.0)
MCHC: 33.1 g/dL (ref 30.0–36.0)
MCV: 92.1 fL (ref 80.0–100.0)
Monocytes Absolute: 0.4 10*3/uL (ref 0.1–1.0)
Monocytes Relative: 20 %
Neutro Abs: 0.9 10*3/uL — ABNORMAL LOW (ref 1.7–7.7)
Neutrophils Relative %: 41 %
Platelets: 241 10*3/uL (ref 150–400)
RBC: 4.04 MIL/uL (ref 3.87–5.11)
RDW: 13.4 % (ref 11.5–15.5)
WBC: 2.1 10*3/uL — ABNORMAL LOW (ref 4.0–10.5)
nRBC: 0 % (ref 0.0–0.2)

## 2023-12-19 LAB — COMPREHENSIVE METABOLIC PANEL
ALT: 9 U/L (ref 0–44)
AST: 15 U/L (ref 15–41)
Albumin: 4.2 g/dL (ref 3.5–5.0)
Alkaline Phosphatase: 49 U/L (ref 38–126)
Anion gap: 9 (ref 5–15)
BUN: 12 mg/dL (ref 8–23)
CO2: 24 mmol/L (ref 22–32)
Calcium: 9.4 mg/dL (ref 8.9–10.3)
Chloride: 105 mmol/L (ref 98–111)
Creatinine, Ser: 0.52 mg/dL (ref 0.44–1.00)
GFR, Estimated: >60 mL/min (ref >60)
Glucose, Bld: 104 mg/dL — ABNORMAL HIGH (ref 70–99)
Potassium: 3.1 mmol/L — ABNORMAL LOW (ref 3.5–5.1)
Sodium: 138 mmol/L (ref 135–145)
Total Bilirubin: 0.8 mg/dL (ref 0.0–1.2)
Total Protein: 7.7 g/dL (ref 6.5–8.1)

## 2023-12-19 MED ORDER — HEPARIN SOD (PORK) LOCK FLUSH 100 UNIT/ML IV SOLN
500.0000 [IU] | Freq: Once | INTRAVENOUS | Status: AC
  Administered 2023-12-19: 500 [IU] via INTRAVENOUS
  Filled 2023-12-19: qty 5

## 2023-12-19 NOTE — Progress Notes (Signed)
 Hematology/Oncology Consult note [facility]  Telephone:([phone] [phone]  Patient Care Team: Epifanio Alm SQUIBB, MD as PCP - General (Infectious Diseases) Verdene Gills, RN as Oncology Nurse Navigator Melanee Annah BROCKS, MD as Consulting Physician (Hematology and Oncology)   Name of the patient: Denver Harder  [id number]  [birth year]   Date of visit: 12/19/23  Diagnosis- extensive stage small cell lung cancer with liver lymph node and brain metastases   Chief complaint/ Reason for visit-on treatment assessment prior to cycle 23 of lurbinectedin   Heme/Onc history: Patient is a 73 year old female with a remote history of smoking in her teenage years and exposure to passive smoking.  She finally had a CT chest with contrast done in October 2021 which showed a large mass in the left side of the mediastinum measuring 6.7 x 6.2 x 6.1 cm causing severe compression of the left main pulmonary artery and around the aortic arch in the left subclavian artery.  Enlarged thyroid  gland.  Left upper lobe nodule 1 x 0.7 cm.  She then underwent bronchoscopy with Dr.Aleskerov left upper lobe biopsy was nondiagnostic.  However station 4, 7 and 10 L lymph nodes were consistent with metastatic small cell carcinoma.   PET CT scan showed enlarged level 3 cervical lymph node 2.2 cm that was hypermetabolic with an SUV of 9.7.  Large central 7.3 cm AP window mass.  5.7 cm left liver mass.  MRI brain with and without contrast showed 2 small foci of enhancement in the right cerebellar hemisphere and right temporal lobe without mass-effect or edema as well concerning for brain metastases   Patient had 2 cycles of carbo etoposide  chemotherapy along with Tecentriq  and subsequent scan showed no significant improvement in the primary lung mass or liver mass.  She received radiation treatment to her primary lung mass and also seen by [facility] for second opinion.  Her pathology was reviewed at  [facility] and reported as possible neuroendocrine neoplasm But stated that small cell carcinoma cannot be excluded but not definitely diagnostic for it.  However [facility] pathology feels that this is consistent with small cell lung cancer.  She has completed 4 cycles of carbo etoposide  Tecentriq  chemotherapy and is currently on maintenance Tecentriq .  Repeat liver biopsy was also performed and was consistent with small cell lung cancer   Disease progression after 23 cycles of maintenance Tecentriq  in the left supraclavicular lymph node region.  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  Plan to switch patient to second line lurbinectedin  since November 2023      Interval history-reports mild chronic fatigue.  Occasional pain in her shoulders and chest wall which is overall stable.  Appetite has remained good and she has not had any unIntentional weight loss.  ECOG PS- 2 Pain scale- 0   Review of systems- Review of Systems  Constitutional:  Positive for malaise/fatigue. Negative for chills, fever and weight loss.  HENT:  Negative for congestion, ear discharge and nosebleeds.   Eyes:  Negative for blurred vision.  Respiratory:  Negative for cough, hemoptysis, sputum production, shortness of breath and wheezing.   Cardiovascular:  Negative for chest pain, palpitations, orthopnea and claudication.  Gastrointestinal:  Negative for abdominal pain, blood in stool, constipation, diarrhea, heartburn, melena, nausea and vomiting.  Genitourinary:  Negative for dysuria, flank pain, frequency, hematuria and urgency.  Musculoskeletal:  Negative for back pain, joint pain and myalgias.  Skin:  Negative for rash.  Neurological:  Negative for dizziness,  tingling, focal weakness, seizures, weakness and headaches.  Endo/Heme/Allergies:  Does not bruise/bleed easily.  Psychiatric/Behavioral:  Negative for depression and suicidal ideas. The patient does not have insomnia.       No  Known Allergies   Past Medical History:  Diagnosis Date   Cancer (HCC)    Cataract    Diabetes mellitus without complication (HCC)    Hyperlipidemia    Hypertension    Hypothyroidism    doctor's keeping an eye on thyroid     Lung cancer [facility])      Past Surgical History:  Procedure Laterality Date   ABDOMINAL HYSTERECTOMY     IR CV LINE INJECTION  09/28/2021   MYRINGOTOMY WITH TUBE PLACEMENT Right 12/29/2022   Procedure: MYRINGOTOMY WITH TUBE PLACEMENT;  Surgeon: Juengel, Paul, MD;  Location: [facility];  Service: ENT;  Laterality: Right;  Diabetic   PORTA CATH INSERTION N/A 11/30/2020   Procedure: PORTA CATH INSERTION;  Surgeon: Marea Selinda RAMAN, MD;  Location: [facility] INVASIVE CV LAB;  Service: Cardiovascular;  Laterality: N/A;   VIDEO BRONCHOSCOPY WITH ENDOBRONCHIAL NAVIGATION N/A 10/21/2020   Procedure: VIDEO BRONCHOSCOPY WITH ENDOBRONCHIAL NAVIGATION;  Surgeon: Parris Manna, MD;  Location: [facility] ORS;  Service: Thoracic;  Laterality: N/A;   VIDEO BRONCHOSCOPY WITH ENDOBRONCHIAL ULTRASOUND N/A 10/21/2020   Procedure: VIDEO BRONCHOSCOPY WITH ENDOBRONCHIAL ULTRASOUND;  Surgeon: Parris Manna, MD;  Location: [facility] ORS;  Service: Thoracic;  Laterality: N/A;    Social History   Socioeconomic History   Marital status: Single    Spouse name: Not on file   Number of children: Not on file   Years of education: Not on file   Highest education level: Not on file  Occupational History   Not on file  Tobacco Use   Smoking status: Never   Smokeless tobacco: Never  Vaping Use   Vaping status: Never Used  Substance and Sexual Activity   Alcohol use: No   Drug use: No   Sexual activity: Not Currently  Other Topics Concern   Not on file  Social History Narrative   Not on file   Social Drivers of Health   Financial Resource Strain: Low Risk  (03/22/2023)   Received from [facility], [facility]   Overall Financial Resource Strain (CARDIA)     Difficulty of Paying Living Expenses: Not hard at all  Food Insecurity: No Food Insecurity (03/22/2023)   Received from [facility], [facility]   Hunger Vital Sign    Worried About Running Out of Food in the Last Year: Never true    Ran Out of Food in the Last Year: Never true  Transportation Needs: Unmet Transportation Needs (03/28/2023)   PRAPARE - Administrator, Civil Service (Medical): Yes    Lack of Transportation (Non-Medical): Yes  Physical Activity: Not on file  Stress: Not on file  Social Connections: Not on file  Intimate Partner Violence: Not on file    Family History  Problem Relation Age of Onset   Cancer Mother        breast   Hypertension Mother    Breast cancer Mother 36   Heart disease Father    Hyperlipidemia Brother    Heart disease Brother      Current Outpatient Medications:    apixaban  (ELIQUIS ) 5 MG TABS tablet, Take 1 tablet (5 mg total) by mouth 2 (two) times daily., Disp: 60 tablet, Rfl: 1   fluticasone  (FLONASE ) 50 MCG/ACT  nasal spray, Place 2 sprays into both nostrils daily., Disp: 16 g, Rfl: 5   furosemide  (LASIX ) 20 MG tablet, Take 20 mg by mouth., Disp: , Rfl:    lidocaine -prilocaine  (EMLA ) cream, Apply 1 Application topically as needed. Apply small amount to port site at least 1 hour prior to it being accessed, cover with plastic wrap, Disp: 30 g, Rfl: 1   losartan (COZAAR) 25 MG tablet, Take 1 tablet by mouth daily., Disp: , Rfl:    metFORMIN (GLUCOPHAGE-XR) 500 MG 24 hr tablet, Take by mouth., Disp: , Rfl:    mirtazapine (REMERON SOL-TAB) 15 MG disintegrating tablet, Take 15 mg by mouth at bedtime., Disp: , Rfl:    potassium chloride  SA (KLOR-CON  M) 20 MEQ tablet, TAKE 1 TABLET(20 MEQ) BY MOUTH DAILY, Disp: 90 tablet, Rfl: 2   traZODone (DESYREL) 50 MG tablet, Take 50 mg by mouth at bedtime., Disp: , Rfl:  No current facility-administered medications for this visit.  Facility-Administered  Medications Ordered in Other Visits:    sodium chloride  flush (NS) 0.9 % injection 10 mL, 10 mL, Intravenous, PRN, Melanee Annah BROCKS, MD, 10 mL at 12/15/20 0850   sodium chloride  flush (NS) 0.9 % injection 10 mL, 10 mL, Intravenous, PRN, Melanee Annah BROCKS, MD, 10 mL at 03/07/23 0923  Physical exam:  Vitals:   12/19/23 0956  BP: (!) 160/82  Pulse: 65  Resp: 19  Temp: (!) 95.9 F (35.5 C)  TempSrc: Tympanic  SpO2: 100%  Weight: 170 lb (77.1 kg)   Physical Exam Cardiovascular:     Rate and Rhythm: Normal rate and regular rhythm.     Heart sounds: Normal heart sounds.  Pulmonary:     Effort: Pulmonary effort is normal.     Breath sounds: Normal breath sounds.  Skin:    General: Skin is warm and dry.  Neurological:     Mental Status: She is alert and oriented to person, place, and time.         Latest Ref Rng & Units 12/19/2023    9:42 AM  CMP  Glucose 70 - 99 mg/dL 895   BUN 8 - 23 mg/dL 12   Creatinine 9.55 - 1.00 mg/dL 9.47   Sodium 864 - 854 mmol/L 138   Potassium 3.5 - 5.1 mmol/L 3.1   Chloride 98 - 111 mmol/L 105   CO2 22 - 32 mmol/L 24   Calcium 8.9 - 10.3 mg/dL 9.4   Total Protein 6.5 - 8.1 g/dL 7.7   Total Bilirubin 0.0 - 1.2 mg/dL 0.8   Alkaline Phos 38 - 126 U/L 49   AST 15 - 41 U/L 15   ALT 0 - 44 U/L 9       Latest Ref Rng & Units 12/19/2023    9:42 AM  CBC  WBC 4.0 - 10.5 K/uL 2.1   Hemoglobin 12.0 - 15.0 g/dL 87.6   Hematocrit 63.9 - 46.0 % 37.2   Platelets 150 - 400 K/uL 241     No images are attached to the encounter.  No results found.   Assessment and plan- Patient is a 73 y.o. female with history of extensive stage small cell lung cancer with liver lymph node and brain metastases here for on treatment assessment prior to cycle 23 of lurbinectedin   Patient received Onpro Neulasta  with last chemotherapy but despite that white count is 2.1 with an ANC of 0.9.  Moreover we are doing this chemotherapy every 28 days instead of every 21 days.  I am  deferring chemo by 2 weeks.  She will directly proceed for cycle 23 in 2 weeks and I will see her back in 5 weeks for cycle 24.  Plan is to continue lurbinectedin  until progression or toxicity.  Last scan from December 2024 overall showed stable size of left supraclavicular adenopathy and stable postradiation changes in the left upper lobe.  Chronic hypokalemia: Continue oral potassium  History of renal vein thrombosis continue Eliquis    Visit Diagnosis 1. Small cell carcinoma of lung metastatic to liver (HCC)   2. Encounter for antineoplastic chemotherapy   3. Chemotherapy induced neutropenia (HCC)      Dr. Annah Skene, MD, MPH [facility] at [facility] [phone] 12/19/2023 10:29 AM

## 2024-01-02 ENCOUNTER — Inpatient Hospital Stay (HOSPITAL_BASED_OUTPATIENT_CLINIC_OR_DEPARTMENT_OTHER): Payer: Medicare HMO | Admitting: Oncology

## 2024-01-02 ENCOUNTER — Inpatient Hospital Stay: Payer: Medicare HMO

## 2024-01-02 VITALS — BP 151/72 | HR 56 | Temp 97.8°F | Resp 18 | Wt 171.0 lb

## 2024-01-02 DIAGNOSIS — Z7901 Long term (current) use of anticoagulants: Secondary | ICD-10-CM

## 2024-01-02 DIAGNOSIS — C3412 Malignant neoplasm of upper lobe, left bronchus or lung: Secondary | ICD-10-CM | POA: Diagnosis not present

## 2024-01-02 DIAGNOSIS — C787 Secondary malignant neoplasm of liver and intrahepatic bile duct: Secondary | ICD-10-CM | POA: Diagnosis not present

## 2024-01-02 DIAGNOSIS — C3490 Malignant neoplasm of unspecified part of unspecified bronchus or lung: Secondary | ICD-10-CM | POA: Diagnosis not present

## 2024-01-02 DIAGNOSIS — E876 Hypokalemia: Secondary | ICD-10-CM | POA: Diagnosis not present

## 2024-01-02 DIAGNOSIS — E119 Type 2 diabetes mellitus without complications: Secondary | ICD-10-CM | POA: Diagnosis not present

## 2024-01-02 DIAGNOSIS — T451X5A Adverse effect of antineoplastic and immunosuppressive drugs, initial encounter: Secondary | ICD-10-CM

## 2024-01-02 DIAGNOSIS — Z5111 Encounter for antineoplastic chemotherapy: Secondary | ICD-10-CM

## 2024-01-02 DIAGNOSIS — D701 Agranulocytosis secondary to cancer chemotherapy: Secondary | ICD-10-CM

## 2024-01-02 DIAGNOSIS — C7931 Secondary malignant neoplasm of brain: Secondary | ICD-10-CM | POA: Diagnosis not present

## 2024-01-02 DIAGNOSIS — E049 Nontoxic goiter, unspecified: Secondary | ICD-10-CM | POA: Diagnosis not present

## 2024-01-02 DIAGNOSIS — M25519 Pain in unspecified shoulder: Secondary | ICD-10-CM | POA: Diagnosis not present

## 2024-01-02 DIAGNOSIS — C770 Secondary and unspecified malignant neoplasm of lymph nodes of head, face and neck: Secondary | ICD-10-CM | POA: Diagnosis not present

## 2024-01-02 LAB — COMPREHENSIVE METABOLIC PANEL
ALT: 11 U/L (ref 0–44)
AST: 14 U/L — ABNORMAL LOW (ref 15–41)
Albumin: 3.8 g/dL (ref 3.5–5.0)
Alkaline Phosphatase: 53 U/L (ref 38–126)
Anion gap: 6 (ref 5–15)
BUN: 12 mg/dL (ref 8–23)
CO2: 26 mmol/L (ref 22–32)
Calcium: 9.4 mg/dL (ref 8.9–10.3)
Chloride: 105 mmol/L (ref 98–111)
Creatinine, Ser: 0.6 mg/dL (ref 0.44–1.00)
GFR, Estimated: >60 mL/min (ref >60)
Glucose, Bld: 95 mg/dL (ref 70–99)
Potassium: 3.3 mmol/L — ABNORMAL LOW (ref 3.5–5.1)
Sodium: 137 mmol/L (ref 135–145)
Total Bilirubin: 0.7 mg/dL (ref 0.0–1.2)
Total Protein: 7.2 g/dL (ref 6.5–8.1)

## 2024-01-02 LAB — CBC WITH DIFFERENTIAL/PLATELET
Abs Immature Granulocytes: 0 10*3/uL (ref 0.00–0.07)
Basophils Absolute: 0 10*3/uL (ref 0.0–0.1)
Basophils Relative: 0 %
Eosinophils Absolute: 0 10*3/uL (ref 0.0–0.5)
Eosinophils Relative: 1 %
HCT: 35.2 % — ABNORMAL LOW (ref 36.0–46.0)
Hemoglobin: 11.8 g/dL — ABNORMAL LOW (ref 12.0–15.0)
Immature Granulocytes: 0 %
Lymphocytes Relative: 28 %
Lymphs Abs: 0.8 10*3/uL (ref 0.7–4.0)
MCH: 30.6 pg (ref 26.0–34.0)
MCHC: 33.5 g/dL (ref 30.0–36.0)
MCV: 91.4 fL (ref 80.0–100.0)
Monocytes Absolute: 0.4 10*3/uL (ref 0.1–1.0)
Monocytes Relative: 12 %
Neutro Abs: 1.7 10*3/uL (ref 1.7–7.7)
Neutrophils Relative %: 59 %
Platelets: 189 10*3/uL (ref 150–400)
RBC: 3.85 MIL/uL — ABNORMAL LOW (ref 3.87–5.11)
RDW: 13 % (ref 11.5–15.5)
WBC: 2.8 10*3/uL — ABNORMAL LOW (ref 4.0–10.5)
nRBC: 0 % (ref 0.0–0.2)

## 2024-01-02 MED ORDER — PALONOSETRON HCL INJECTION 0.25 MG/5ML
0.2500 mg | Freq: Once | INTRAVENOUS | Status: AC
  Administered 2024-01-02: 0.25 mg via INTRAVENOUS
  Filled 2024-01-02: qty 5

## 2024-01-02 MED ORDER — SODIUM CHLORIDE 0.9 % IV SOLN
Freq: Once | INTRAVENOUS | Status: AC
  Filled 2024-01-02: qty 250

## 2024-01-02 MED ORDER — DEXAMETHASONE SODIUM PHOSPHATE 10 MG/ML IJ SOLN
10.0000 mg | Freq: Once | INTRAMUSCULAR | Status: AC
  Administered 2024-01-02: 10 mg via INTRAVENOUS
  Filled 2024-01-02: qty 1

## 2024-01-02 MED ORDER — SODIUM CHLORIDE 0.9 % IV SOLN
2.5600 mg/m2 | Freq: Once | INTRAVENOUS | Status: AC
  Administered 2024-01-02: 4.6 mg via INTRAVENOUS
  Filled 2024-01-02: qty 9.2

## 2024-01-02 MED ORDER — PEGFILGRASTIM 6 MG/0.6ML ~~LOC~~ PSKT
6.0000 mg | PREFILLED_SYRINGE | Freq: Once | SUBCUTANEOUS | Status: AC
  Administered 2024-01-02: 6 mg via SUBCUTANEOUS
  Filled 2024-01-02: qty 0.6

## 2024-01-02 MED ORDER — HEPARIN SOD (PORK) LOCK FLUSH 100 UNIT/ML IV SOLN
500.0000 [IU] | Freq: Once | INTRAVENOUS | Status: DC | PRN
  Filled 2024-01-02: qty 5

## 2024-01-02 NOTE — Patient Instructions (Signed)
[facility] - A DEPT OF [facility]  Discharge Instructions: Thank you for choosing [facility] to provide your oncology and hematology care.  If you have a lab appointment with the Cancer Center, please go directly to the Cancer Center and check in at the registration area.  Wear comfortable clothing and clothing appropriate for easy access to any Portacath or PICC line.   We strive to give you quality time with your provider. You may need to reschedule your appointment if you arrive late (15 or more minutes).  Arriving late affects you and other patients whose appointments are after yours.  Also, if you miss three or more appointments without notifying the office, you may be dismissed from the clinic at the provider's discretion.      For prescription refill requests, have your pharmacy contact our office and allow 72 hours for refills to be completed.    Today you received the following chemotherapy and/or immunotherapy agents Zepzelca & Neulasta      To help prevent nausea and vomiting after your treatment, we encourage you to take your nausea medication as directed.  BELOW ARE SYMPTOMS THAT SHOULD BE REPORTED IMMEDIATELY: *FEVER GREATER THAN 100.4 F (38 C) OR HIGHER *CHILLS OR SWEATING *NAUSEA AND VOMITING THAT IS NOT CONTROLLED WITH YOUR NAUSEA MEDICATION *UNUSUAL SHORTNESS OF BREATH *UNUSUAL BRUISING OR BLEEDING *URINARY PROBLEMS (pain or burning when urinating, or frequent urination) *BOWEL PROBLEMS (unusual diarrhea, constipation, pain near the anus) TENDERNESS IN MOUTH AND THROAT WITH OR WITHOUT PRESENCE OF ULCERS (sore throat, sores in mouth, or a toothache) UNUSUAL RASH, SWELLING OR PAIN  UNUSUAL VAGINAL DISCHARGE OR ITCHING   Items with * indicate a potential emergency and should be followed up as soon as possible or go to the Emergency Department if any problems should occur.  Please show the CHEMOTHERAPY ALERT CARD or  IMMUNOTHERAPY ALERT CARD at check-in to the Emergency Department and triage nurse.  Should you have questions after your visit or need to cancel or reschedule your appointment, please contact [facility] - A DEPT OF [facility]: [place] [address]  [phone] and follow the prompts.  Office hours are 8:00 a.m. to 4:30 p.m. Monday - Friday. Please note that voicemails left after 4:00 p.m. may not be returned until the following business day.  We are closed weekends and major holidays. You have access to a nurse at all times for urgent questions. Please call the main number to the clinic [phone] and follow the prompts.  For any non-urgent questions, you may also contact your provider using MyChart. We now offer e-Visits for anyone 75 and older to request care online for non-urgent symptoms. For details visit [email or web address].   Also download the MyChart app! Go to the app store, search "MyChart", open the app, select [place], and log in with your MyChart username and password.

## 2024-01-02 NOTE — Patient Instructions (Signed)

## 2024-01-02 NOTE — Progress Notes (Signed)
Hematology/Oncology Consult note [facility]  Telephone:([phone] Fax:([phone]  Patient Care Team: Mick Sell, MD as PCP - General (Infectious Diseases) Glory Buff, RN as Oncology Nurse Navigator Creig Hines, MD as Consulting Physician (Hematology and Oncology)   Name of the patient: Doris Johnson  [id number]  09-13-1951   Date of visit: 01/02/24  Diagnosis- extensive stage small cell lung cancer with liver lymph node and brain metastases   Chief complaint/ Reason for visit-on treatment assessment prior to cycle 24 of lurbinectedin  Heme/Onc history:  Patient is a 73 year old female with a remote history of smoking in her teenage years and exposure to passive smoking.  She finally had a CT chest with contrast done in October 2021 which showed a large mass in the left side of the mediastinum measuring 6.7 x 6.2 x 6.1 cm causing severe compression of the left main pulmonary artery and around the aortic arch in the left subclavian artery.  Enlarged thyroid gland.  Left upper lobe nodule 1 x 0.7 cm.  She then underwent bronchoscopy with Dr.Aleskerov left upper lobe biopsy was nondiagnostic.  However station 4, 7 and 10 L lymph nodes were consistent with metastatic small cell carcinoma.   PET CT scan showed enlarged level 3 cervical lymph node 2.2 cm that was hypermetabolic with an SUV of 9.7.  Large central 7.3 cm AP window mass.  5.7 cm left liver mass.  MRI brain with and without contrast showed 2 small foci of enhancement in the right cerebellar hemisphere and right temporal lobe without mass-effect or edema as well concerning for brain metastases   Patient had 2 cycles of carbo etoposide chemotherapy along with Tecentriq and subsequent scan showed no significant improvement in the primary lung mass or liver mass.  She received radiation treatment to her primary lung mass and also seen by [facility] for second opinion.  Her pathology was reviewed  at [facility] and reported as possible neuroendocrine neoplasm But stated that small cell carcinoma cannot be excluded but not definitely diagnostic for it.  However [facility] pathology feels that this is consistent with small cell lung cancer.  She has completed 4 cycles of carbo etoposide Tecentriq chemotherapy and is currently on maintenance Tecentriq.  Repeat liver biopsy was also performed and was consistent with small cell lung cancer   Disease progression after 23 cycles of maintenance Tecentriq in the left supraclavicular lymph node region.  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  Plan to switch patient to second line lurbinectedin since November 2023    Interval history-she is here with her daughter today and is overall doing well.  Denies any changes in her appetite or weight.  Denies any abdominal pain or shortness of breath  ECOG PS- 1 Pain scale- 0   Review of systems- Review of Systems  Constitutional:  Negative for chills, fever, malaise/fatigue and weight loss.  HENT:  Negative for congestion, ear discharge and nosebleeds.   Eyes:  Negative for blurred vision.  Respiratory:  Negative for cough, hemoptysis, sputum production, shortness of breath and wheezing.   Cardiovascular:  Negative for chest pain, palpitations, orthopnea and claudication.  Gastrointestinal:  Negative for abdominal pain, blood in stool, constipation, diarrhea, heartburn, melena, nausea and vomiting.  Genitourinary:  Negative for dysuria, flank pain, frequency, hematuria and urgency.  Musculoskeletal:  Negative for back pain, joint pain and myalgias.  Skin:  Negative for rash.  Neurological:  Negative for dizziness, tingling, focal weakness, seizures,  weakness and headaches.  Endo/Heme/Allergies:  Does not bruise/bleed easily.  Psychiatric/Behavioral:  Negative for depression and suicidal ideas. The patient does not have insomnia.       No Known Allergies   Past Medical  History:  Diagnosis Date   Cancer (HCC)    Cataract    Diabetes mellitus without complication (HCC)    Hyperlipidemia    Hypertension    Hypothyroidism    doctor's keeping an eye on thyroid    Lung cancer [facility])      Past Surgical History:  Procedure Laterality Date   ABDOMINAL HYSTERECTOMY     IR CV LINE INJECTION  09/28/2021   MYRINGOTOMY WITH TUBE PLACEMENT Right 12/29/2022   Procedure: MYRINGOTOMY WITH TUBE PLACEMENT;  Surgeon: Vernie Murders, MD;  Location: [facility];  Service: ENT;  Laterality: Right;  Diabetic   PORTA CATH INSERTION N/A 11/30/2020   Procedure: PORTA CATH INSERTION;  Surgeon: Annice Needy, MD;  Location: [facility] IN[facility] LAB;  Service: Cardiovascular;  Laterality: N/A;   VIDEO BRONCHOSCOPY WITH ENDOBRONCHIAL NAVIGATION N/A 10/21/2020   Procedure: VIDEO BRONCHOSCOPY WITH ENDOBRONCHIAL NAVIGATION;  Surgeon: Vida Rigger, MD;  Location: [facility] ORS;  Service: Thoracic;  Laterality: N/A;   VIDEO BRONCHOSCOPY WITH ENDOBRONCHIAL ULTRASOUND N/A 10/21/2020   Procedure: VIDEO BRONCHOSCOPY WITH ENDOBRONCHIAL ULTRASOUND;  Surgeon: Vida Rigger, MD;  Location: [facility] ORS;  Service: Thoracic;  Laterality: N/A;    Social History   Socioeconomic History   Marital status: Single    Spouse name: Not on file   Number of children: Not on file   Years of education: Not on file   Highest education level: Not on file  Occupational History   Not on file  Tobacco Use   Smoking status: Never   Smokeless tobacco: Never  Vaping Use   Vaping status: Never Used  Substance and Sexual Activity   Alcohol use: No   Drug use: No   Sexual activity: Not Currently  Other Topics Concern   Not on file  Social History Narrative   Not on file   Social Drivers of Health   Financial Resource Strain: Low Risk  (03/22/2023)   Received from [facility], [facility]   Overall Financial Resource Strain (CARDIA)    Difficulty of Paying Living  Expenses: Not hard at all  Food Insecurity: No Food Insecurity (03/22/2023)   Received from [facility], [facility]   Hunger Vital Sign    Worried About Running Out of Food in the Last Year: Never true    Ran Out of Food in the Last Year: Never true  Transportation Needs: Unmet Transportation Needs (03/28/2023)   PRAPARE - Administrator, Civil Service (Medical): Yes    Lack of Transportation (Non-Medical): Yes  Physical Activity: Not on file  Stress: Not on file  Social Connections: Not on file  Intimate Partner Violence: Not on file    Family History  Problem Relation Age of Onset   Cancer Mother        breast   Hypertension Mother    Breast cancer Mother 41   Heart disease Father    Hyperlipidemia Brother    Heart disease Brother      Current Outpatient Medications:    apixaban (ELIQUIS) 5 MG TABS tablet, Take 1 tablet (5 mg total) by mouth 2 (two) times daily., Disp: 60 tablet, Rfl: 1   fluticasone (FLONASE) 50 MCG/ACT nasal spray, Place 2  sprays into both nostrils daily., Disp: 16 g, Rfl: 5   furosemide (LASIX) 20 MG tablet, Take 20 mg by mouth., Disp: , Rfl:    lidocaine-prilocaine (EMLA) cream, Apply 1 Application topically as needed. Apply small amount to port site at least 1 hour prior to it being accessed, cover with plastic wrap, Disp: 30 g, Rfl: 1   losartan (COZAAR) 25 MG tablet, Take 1 tablet by mouth daily., Disp: , Rfl:    metFORMIN (GLUCOPHAGE-XR) 500 MG 24 hr tablet, Take by mouth., Disp: , Rfl:    mirtazapine (REMERON SOL-TAB) 15 MG disintegrating tablet, Take 15 mg by mouth at bedtime., Disp: , Rfl:    potassium chloride SA (KLOR-CON M) 20 MEQ tablet, TAKE 1 TABLET(20 MEQ) BY MOUTH DAILY, Disp: 90 tablet, Rfl: 2   traZODone (DESYREL) 50 MG tablet, Take 50 mg by mouth at bedtime., Disp: , Rfl:  No current facility-administered medications for this visit.  Facility-Administered Medications Ordered in Other Visits:     heparin lock flush 100 unit/mL, 500 Units, Intracatheter, Once PRN, Creig Hines, MD   sodium chloride flush (NS) 0.9 % injection 10 mL, 10 mL, Intravenous, PRN, Creig Hines, MD, 10 mL at 12/15/20 0850   sodium chloride flush (NS) 0.9 % injection 10 mL, 10 mL, Intravenous, PRN, Creig Hines, MD, 10 mL at 03/07/23 0923  Physical exam:  Vitals:   01/02/24 0854  BP: (!) 151/72  Pulse: (!) 56  Resp: 18  Temp: 97.8 F (36.6 C)  TempSrc: Tympanic  SpO2: 100%  Weight: 171 lb (77.6 kg)   Physical Exam Cardiovascular:     Rate and Rhythm: Normal rate and regular rhythm.     Heart sounds: Normal heart sounds.  Pulmonary:     Effort: Pulmonary effort is normal.     Breath sounds: Normal breath sounds.  Abdominal:     General: Bowel sounds are normal.     Palpations: Abdomen is soft.  Lymphadenopathy:     Comments: Palpable left supraclavicular adenopathy  Skin:    General: Skin is warm and dry.  Neurological:     Mental Status: She is alert and oriented to person, place, and time.         Latest Ref Rng & Units 01/02/2024    8:34 AM  CMP  Glucose 70 - 99 mg/dL 95   BUN 8 - 23 mg/dL 12   Creatinine 1.61 - 1.00 mg/dL 0.96   Sodium 045 - 409 mmol/L 137   Potassium 3.5 - 5.1 mmol/L 3.3   Chloride 98 - 111 mmol/L 105   CO2 22 - 32 mmol/L 26   Calcium 8.9 - 10.3 mg/dL 9.4   Total Protein 6.5 - 8.1 g/dL 7.2   Total Bilirubin 0.0 - 1.2 mg/dL 0.7   Alkaline Phos 38 - 126 U/L 53   AST 15 - 41 U/L 14   ALT 0 - 44 U/L 11       Latest Ref Rng & Units 01/02/2024    8:34 AM  CBC  WBC 4.0 - 10.5 K/uL 2.8   Hemoglobin 12.0 - 15.0 g/dL 81.1   Hematocrit 91.4 - 46.0 % 35.2   Platelets 150 - 400 K/uL 189     No images are attached to the encounter.  No results found.   Assessment and plan- Patient is a 73 y.o. female with history of extensive stage small cell lung cancer with liver lymph node and brain metastases here for on treatment  assessment prior to cycle 24 of  lurbinectedin  Counts okay to proceed with cycle 24 of lurbinectedin today.  ANC remains more than 1.  I will see her back in 4 weeks for cycle 25.  Scans will be sometime in April 2025.  She gets Neulasta support with every cycle of chemotherapy hemoglobin and platelet counts have remained stable  History of renal vein thrombosis: Continue Eliquis  Chronic hypokalemia continue oral potassium   Visit Diagnosis 1. Small cell carcinoma of lung metastatic to liver (HCC)   2. Encounter for antineoplastic chemotherapy   3. Chemotherapy induced neutropenia (HCC)   4. Current use of long term anticoagulation   5. Hypokalemia      Dr. Owens Shark, MD, MPH [facility] at [facility] [phone] 01/02/2024 12:42 PM

## 2024-01-04 ENCOUNTER — Telehealth: Payer: Self-pay | Admitting: *Deleted

## 2024-01-04 NOTE — Telephone Encounter (Signed)
I filled out the form and Smith Robert to sign tomorrow and then pt can get the wig. Daughter knows to come tomorrow to get it.

## 2024-01-08 ENCOUNTER — Telehealth: Payer: Self-pay | Admitting: *Deleted

## 2024-01-08 NOTE — Telephone Encounter (Signed)
 The paper I put in was for wig but they want the cancer dx code. C34.90 for the small cell carcinoma of lung and C78.7 for the mets to liver . Callback if needed

## 2024-01-30 ENCOUNTER — Encounter: Payer: Self-pay | Admitting: Oncology

## 2024-01-30 ENCOUNTER — Inpatient Hospital Stay (HOSPITAL_BASED_OUTPATIENT_CLINIC_OR_DEPARTMENT_OTHER): Payer: Medicare HMO | Admitting: Oncology

## 2024-01-30 ENCOUNTER — Inpatient Hospital Stay: Payer: Medicare HMO

## 2024-01-30 ENCOUNTER — Inpatient Hospital Stay: Payer: Medicare HMO | Attending: Oncology

## 2024-01-30 VITALS — BP 147/75 | HR 57 | Resp 16

## 2024-01-30 VITALS — BP 146/81 | HR 88 | Temp 97.5°F | Resp 18 | Ht 66.0 in | Wt 176.0 lb

## 2024-01-30 DIAGNOSIS — Z7901 Long term (current) use of anticoagulants: Secondary | ICD-10-CM | POA: Diagnosis not present

## 2024-01-30 DIAGNOSIS — Z79630 Long term (current) use of alkylating agent: Secondary | ICD-10-CM | POA: Insufficient documentation

## 2024-01-30 DIAGNOSIS — C787 Secondary malignant neoplasm of liver and intrahepatic bile duct: Secondary | ICD-10-CM | POA: Diagnosis not present

## 2024-01-30 DIAGNOSIS — C3491 Malignant neoplasm of unspecified part of right bronchus or lung: Secondary | ICD-10-CM

## 2024-01-30 DIAGNOSIS — E049 Nontoxic goiter, unspecified: Secondary | ICD-10-CM | POA: Insufficient documentation

## 2024-01-30 DIAGNOSIS — C3490 Malignant neoplasm of unspecified part of unspecified bronchus or lung: Secondary | ICD-10-CM | POA: Diagnosis not present

## 2024-01-30 DIAGNOSIS — Z5111 Encounter for antineoplastic chemotherapy: Secondary | ICD-10-CM | POA: Insufficient documentation

## 2024-01-30 DIAGNOSIS — Z86718 Personal history of other venous thrombosis and embolism: Secondary | ICD-10-CM | POA: Insufficient documentation

## 2024-01-30 DIAGNOSIS — Z5982 Transportation insecurity: Secondary | ICD-10-CM | POA: Diagnosis not present

## 2024-01-30 DIAGNOSIS — R590 Localized enlarged lymph nodes: Secondary | ICD-10-CM | POA: Insufficient documentation

## 2024-01-30 DIAGNOSIS — C3412 Malignant neoplasm of upper lobe, left bronchus or lung: Secondary | ICD-10-CM | POA: Insufficient documentation

## 2024-01-30 DIAGNOSIS — R160 Hepatomegaly, not elsewhere classified: Secondary | ICD-10-CM | POA: Insufficient documentation

## 2024-01-30 DIAGNOSIS — Z8249 Family history of ischemic heart disease and other diseases of the circulatory system: Secondary | ICD-10-CM | POA: Diagnosis not present

## 2024-01-30 DIAGNOSIS — Z83438 Family history of other disorder of lipoprotein metabolism and other lipidemia: Secondary | ICD-10-CM | POA: Insufficient documentation

## 2024-01-30 DIAGNOSIS — Z79899 Other long term (current) drug therapy: Secondary | ICD-10-CM | POA: Insufficient documentation

## 2024-01-30 DIAGNOSIS — E876 Hypokalemia: Secondary | ICD-10-CM | POA: Insufficient documentation

## 2024-01-30 DIAGNOSIS — Z90710 Acquired absence of both cervix and uterus: Secondary | ICD-10-CM | POA: Insufficient documentation

## 2024-01-30 DIAGNOSIS — Z803 Family history of malignant neoplasm of breast: Secondary | ICD-10-CM | POA: Insufficient documentation

## 2024-01-30 LAB — COMPREHENSIVE METABOLIC PANEL
ALT: 10 U/L (ref 0–44)
AST: 15 U/L (ref 15–41)
Albumin: 4 g/dL (ref 3.5–5.0)
Alkaline Phosphatase: 54 U/L (ref 38–126)
Anion gap: 8 (ref 5–15)
BUN: 12 mg/dL (ref 8–23)
CO2: 25 mmol/L (ref 22–32)
Calcium: 9.6 mg/dL (ref 8.9–10.3)
Chloride: 104 mmol/L (ref 98–111)
Creatinine, Ser: 0.66 mg/dL (ref 0.44–1.00)
GFR, Estimated: >60 mL/min (ref >60)
Glucose, Bld: 123 mg/dL — ABNORMAL HIGH (ref 70–99)
Potassium: 3.2 mmol/L — ABNORMAL LOW (ref 3.5–5.1)
Sodium: 137 mmol/L (ref 135–145)
Total Bilirubin: 0.6 mg/dL (ref 0.0–1.2)
Total Protein: 7.9 g/dL (ref 6.5–8.1)

## 2024-01-30 LAB — CBC WITH DIFFERENTIAL/PLATELET
Abs Immature Granulocytes: 0.01 10*3/uL (ref 0.00–0.07)
Basophils Absolute: 0 10*3/uL (ref 0.0–0.1)
Basophils Relative: 1 %
Eosinophils Absolute: 0 10*3/uL (ref 0.0–0.5)
Eosinophils Relative: 1 %
HCT: 38 % (ref 36.0–46.0)
Hemoglobin: 12.6 g/dL (ref 12.0–15.0)
Immature Granulocytes: 0 %
Lymphocytes Relative: 17 %
Lymphs Abs: 0.8 10*3/uL (ref 0.7–4.0)
MCH: 30.4 pg (ref 26.0–34.0)
MCHC: 33.2 g/dL (ref 30.0–36.0)
MCV: 91.6 fL (ref 80.0–100.0)
Monocytes Absolute: 0.4 10*3/uL (ref 0.1–1.0)
Monocytes Relative: 8 %
Neutro Abs: 3.2 10*3/uL (ref 1.7–7.7)
Neutrophils Relative %: 73 %
Platelets: 237 10*3/uL (ref 150–400)
RBC: 4.15 MIL/uL (ref 3.87–5.11)
RDW: 14.1 % (ref 11.5–15.5)
WBC: 4.4 10*3/uL (ref 4.0–10.5)
nRBC: 0 % (ref 0.0–0.2)

## 2024-01-30 MED ORDER — PEGFILGRASTIM 6 MG/0.6ML ~~LOC~~ PSKT
6.0000 mg | PREFILLED_SYRINGE | Freq: Once | SUBCUTANEOUS | Status: AC
  Administered 2024-01-30: 6 mg via SUBCUTANEOUS

## 2024-01-30 MED ORDER — HEPARIN SOD (PORK) LOCK FLUSH 100 UNIT/ML IV SOLN
500.0000 [IU] | Freq: Once | INTRAVENOUS | Status: DC | PRN
  Filled 2024-01-30: qty 5

## 2024-01-30 MED ORDER — SODIUM CHLORIDE 0.9 % IV SOLN
Freq: Once | INTRAVENOUS | Status: AC
  Filled 2024-01-30: qty 250

## 2024-01-30 MED ORDER — DEXAMETHASONE SODIUM PHOSPHATE 10 MG/ML IJ SOLN
10.0000 mg | Freq: Once | INTRAMUSCULAR | Status: AC
  Administered 2024-01-30: 10 mg via INTRAVENOUS
  Filled 2024-01-30: qty 1

## 2024-01-30 MED ORDER — PALONOSETRON HCL INJECTION 0.25 MG/5ML
0.2500 mg | Freq: Once | INTRAVENOUS | Status: AC
  Administered 2024-01-30: 0.25 mg via INTRAVENOUS
  Filled 2024-01-30: qty 5

## 2024-01-30 MED ORDER — SODIUM CHLORIDE 0.9 % IV SOLN
2.5600 mg/m2 | Freq: Once | INTRAVENOUS | Status: AC
  Administered 2024-01-30: 4.6 mg via INTRAVENOUS
  Filled 2024-01-30: qty 9.2

## 2024-01-30 NOTE — Patient Instructions (Addendum)
 [facility] - A DEPT OF [facility]  Discharge Instructions: Thank you for choosing [facility] to provide your oncology and hematology care.  If you have a lab appointment with the Cancer Center, please go directly to the Cancer Center and check in at the registration area.  Wear comfortable clothing and clothing appropriate for easy access to any Portacath or PICC line.   We strive to give you quality time with your provider. You may need to reschedule your appointment if you arrive late (15 or more minutes).  Arriving late affects you and other patients whose appointments are after yours.  Also, if you miss three or more appointments without notifying the office, you may be dismissed from the clinic at the provider's discretion.      For prescription refill requests, have your pharmacy contact our office and allow 72 hours for refills to be completed.    Today you received the following chemotherapy and/or immunotherapy agents Zepzelca & Neulasta      To help prevent nausea and vomiting after your treatment, we encourage you to take your nausea medication as directed.  BELOW ARE SYMPTOMS THAT SHOULD BE REPORTED IMMEDIATELY: *FEVER GREATER THAN 100.4 F (38 C) OR HIGHER *CHILLS OR SWEATING *NAUSEA AND VOMITING THAT IS NOT CONTROLLED WITH YOUR NAUSEA MEDICATION *UNUSUAL SHORTNESS OF BREATH *UNUSUAL BRUISING OR BLEEDING *URINARY PROBLEMS (pain or burning when urinating, or frequent urination) *BOWEL PROBLEMS (unusual diarrhea, constipation, pain near the anus) TENDERNESS IN MOUTH AND THROAT WITH OR WITHOUT PRESENCE OF ULCERS (sore throat, sores in mouth, or a toothache) UNUSUAL RASH, SWELLING OR PAIN  UNUSUAL VAGINAL DISCHARGE OR ITCHING   Items with * indicate a potential emergency and should be followed up as soon as possible or go to the Emergency Department if any problems should occur.  Please show the CHEMOTHERAPY ALERT CARD or  IMMUNOTHERAPY ALERT CARD at check-in to the Emergency Department and triage nurse.  Should you have questions after your visit or need to cancel or reschedule your appointment, please contact [facility] - A DEPT OF [facility]: [address]onda [address]  [phone] and follow the prompts.  Office hours are 8:00 a.m. to 4:30 p.m. Monday - Friday. Please note that voicemails left after 4:00 p.m. may not be returned until the following business day.  We are closed weekends and major holidays. You have access to a nurse at all times for urgent questions. Please call the main number to the clinic [phone] and follow the prompts.  For any non-urgent questions, you may also contact your provider using MyChart. We now offer e-Visits for anyone 75 and older to request care online for non-urgent symptoms. For details visit [email or web address].   Also download the MyChart app! Go to the app store, search "MyChart", open the app, select [place], and log in with your MyChart username and password.

## 2024-01-30 NOTE — Patient Instructions (Signed)

## 2024-01-30 NOTE — Progress Notes (Signed)
 Hematology/Oncology Consult note [facility]  Telephone:([phone] [phone]  Patient Care Team: Mick Sell, MD as PCP - General (Infectious Diseases) Glory Buff, RN as Oncology Nurse Navigator Creig Hines, MD as Consulting Physician (Hematology and Oncology)   Name of the patient: Doris Johnson  [id number]  Sep 01, 1951   Date of visit: 01/30/24  Diagnosis- extensive stage small cell lung cancer with liver lymph node and brain metastases   Chief complaint/ Reason for visit- on treatment assessment prior to cycle 25 of lurbinectedin  Heme/Onc history:  Patient is a 73 year old female with a remote history of smoking in her teenage years and exposure to passive smoking.  She finally had a CT chest with contrast done in October 2021 which showed a large mass in the left side of the mediastinum measuring 6.7 x 6.2 x 6.1 cm causing severe compression of the left main pulmonary artery and around the aortic arch in the left subclavian artery.  Enlarged thyroid gland.  Left upper lobe nodule 1 x 0.7 cm.  She then underwent bronchoscopy with Dr.Aleskerov left upper lobe biopsy was nondiagnostic.  However station 4, 7 and 10 L lymph nodes were consistent with metastatic small cell carcinoma.   PET CT scan showed enlarged level 3 cervical lymph node 2.2 cm that was hypermetabolic with an SUV of 9.7.  Large central 7.3 cm AP window mass.  5.7 cm left liver mass.  MRI brain with and without contrast showed 2 small foci of enhancement in the right cerebellar hemisphere and right temporal lobe without mass-effect or edema as well concerning for brain metastases   Patient had 2 cycles of carbo etoposide chemotherapy along with Tecentriq and subsequent scan showed no significant improvement in the primary lung mass or liver mass.  She received radiation treatment to her primary lung mass and also seen by [facility] for second opinion.  Her pathology was reviewed  at [facility] and reported as possible neuroendocrine neoplasm But stated that small cell carcinoma cannot be excluded but not definitely diagnostic for it.  However [facility] pathology feels that this is consistent with small cell lung cancer.  She has completed 4 cycles of carbo etoposide Tecentriq chemotherapy and is currently on maintenance Tecentriq.  Repeat liver biopsy was also performed and was consistent with small cell lung cancer   Disease progression after 23 cycles of maintenance Tecentriq in the left supraclavicular lymph node region.  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  Plan to switch patient to second line lurbinectedin since November 2023    Interval history-tolerating treatments well so far.  Denies any changes in her appetite or weight.  Denies any new aches and pains anywhere.  ECOG PS- 1 Pain scale- 0  Review of systems- Review of Systems  Constitutional:  Negative for chills, fever, malaise/fatigue and weight loss.  HENT:  Negative for congestion, ear discharge and nosebleeds.   Eyes:  Negative for blurred vision.  Respiratory:  Negative for cough, hemoptysis, sputum production, shortness of breath and wheezing.   Cardiovascular:  Negative for chest pain, palpitations, orthopnea and claudication.  Gastrointestinal:  Negative for abdominal pain, blood in stool, constipation, diarrhea, heartburn, melena, nausea and vomiting.  Genitourinary:  Negative for dysuria, flank pain, frequency, hematuria and urgency.  Musculoskeletal:  Negative for back pain, joint pain and myalgias.  Skin:  Negative for rash.  Neurological:  Negative for dizziness, tingling, focal weakness, seizures, weakness and headaches.  Endo/Heme/Allergies:  Does not  bruise/bleed easily.  Psychiatric/Behavioral:  Negative for depression and suicidal ideas. The patient does not have insomnia.       No Known Allergies   Past Medical History:  Diagnosis Date   Cancer (HCC)     Cataract    Diabetes mellitus without complication (HCC)    Hyperlipidemia    Hypertension    Hypothyroidism    doctor's keeping an eye on thyroid    Lung cancer [facility])      Past Surgical History:  Procedure Laterality Date   ABDOMINAL HYSTERECTOMY     IR CV LINE INJECTION  09/28/2021   MYRINGOTOMY WITH TUBE PLACEMENT Right 12/29/2022   Procedure: MYRINGOTOMY WITH TUBE PLACEMENT;  Surgeon: Vernie Murders, MD;  Location: [facility];  Service: ENT;  Laterality: Right;  Diabetic   PORTA CATH INSERTION N/A 11/30/2020   Procedure: PORTA CATH INSERTION;  Surgeon: Annice Needy, MD;  Location: [facility] INVASIVE CV LAB;  Service: Cardiovascular;  Laterality: N/A;   VIDEO BRONCHOSCOPY WITH ENDOBRONCHIAL NAVIGATION N/A 10/21/2020   Procedure: VIDEO BRONCHOSCOPY WITH ENDOBRONCHIAL NAVIGATION;  Surgeon: Vida Rigger, MD;  Location: [facility] ORS;  Service: Thoracic;  Laterality: N/A;   VIDEO BRONCHOSCOPY WITH ENDOBRONCHIAL ULTRASOUND N/A 10/21/2020   Procedure: VIDEO BRONCHOSCOPY WITH ENDOBRONCHIAL ULTRASOUND;  Surgeon: Vida Rigger, MD;  Location: [facility] ORS;  Service: Thoracic;  Laterality: N/A;    Social History   Socioeconomic History   Marital status: Single    Spouse name: Not on file   Number of children: Not on file   Years of education: Not on file   Highest education level: Not on file  Occupational History   Not on file  Tobacco Use   Smoking status: Never   Smokeless tobacco: Never  Vaping Use   Vaping status: Never Used  Substance and Sexual Activity   Alcohol use: No   Drug use: No   Sexual activity: Not Currently  Other Topics Concern   Not on file  Social History Narrative   Not on file   Social Drivers of Health   Financial Resource Strain: Low Risk  (03/22/2023)   [facility], [facility]   Overall Financial Resource Strain (CARDIA)    Difficulty of Paying Living Expenses: Not hard at all  Food Insecurity: No  Food Insecurity (03/22/2023)   [facility], [facility]   Hunger Vital Sign    Worried About Running Out of Food in the Last Year: Never true    Ran Out of Food in the Last Year: Never true  Transportation Needs: Unmet Transportation Needs (03/28/2023)   PRAPARE - Administrator, Civil Service (Medical): Yes    Lack of Transportation (Non-Medical): Yes  Physical Activity: Not on file  Stress: Not on file  Social Connections: Not on file  Intimate Partner Violence: Not on file    Family History  Problem Relation Age of Onset   Cancer Mother        breast   Hypertension Mother    Breast cancer Mother 71   Heart disease Father    Hyperlipidemia Brother    Heart disease Brother      Current Outpatient Medications:    apixaban (ELIQUIS) 5 MG TABS tablet, Take 1 tablet (5 mg total) by mouth 2 (two) times daily., Disp: 60 tablet, Rfl: 1   fluticasone (FLONASE) 50 MCG/ACT nasal spray, Place 2 sprays into both nostrils daily., Disp: 16 g,  Rfl: 5   furosemide (LASIX) 20 MG tablet, Take 20 mg by mouth., Disp: , Rfl:    lidocaine-prilocaine (EMLA) cream, Apply 1 Application topically as needed. Apply small amount to port site at least 1 hour prior to it being accessed, cover with plastic wrap, Disp: 30 g, Rfl: 1   losartan (COZAAR) 25 MG tablet, Take 1 tablet by mouth daily., Disp: , Rfl:    metFORMIN (GLUCOPHAGE-XR) 500 MG 24 hr tablet, Take by mouth., Disp: , Rfl:    mirtazapine (REMERON SOL-TAB) 15 MG disintegrating tablet, Take 15 mg by mouth at bedtime., Disp: , Rfl:    potassium chloride SA (KLOR-CON M) 20 MEQ tablet, TAKE 1 TABLET(20 MEQ) BY MOUTH DAILY, Disp: 90 tablet, Rfl: 2   traZODone (DESYREL) 50 MG tablet, Take 50 mg by mouth at bedtime., Disp: , Rfl:  No current facility-administered medications for this visit.  Facility-Administered Medications Ordered in Other Visits:    heparin lock flush 100 unit/mL, 500 Units,  Intracatheter, Once PRN, Creig Hines, MD   sodium chloride flush (NS) 0.9 % injection 10 mL, 10 mL, Intravenous, PRN, Creig Hines, MD, 10 mL at 12/15/20 0850   sodium chloride flush (NS) 0.9 % injection 10 mL, 10 mL, Intravenous, PRN, Creig Hines, MD, 10 mL at 03/07/23 0923  Physical exam:  Vitals:   01/30/24 0917  BP: (!) 146/81  Pulse: 88  Resp: 18  Temp: (!) 97.5 F (36.4 C)  TempSrc: Tympanic  SpO2: 100%  Weight: 176 lb (79.8 kg)  Height: 5\' 6"  (1.676 m)   Physical Exam Cardiovascular:     Rate and Rhythm: Normal rate and regular rhythm.     Heart sounds: Normal heart sounds.  Pulmonary:     Effort: Pulmonary effort is normal.     Breath sounds: Normal breath sounds.  Skin:    General: Skin is warm and dry.  Neurological:     Mental Status: She is alert and oriented to person, place, and time.         Latest Ref Rng & Units 01/30/2024    8:47 AM  CMP  Glucose 70 - 99 mg/dL 604   BUN 8 - 23 mg/dL 12   Creatinine 5.40 - 1.00 mg/dL 9.81   Sodium 191 - 478 mmol/L 137   Potassium 3.5 - 5.1 mmol/L 3.2   Chloride 98 - 111 mmol/L 104   CO2 22 - 32 mmol/L 25   Calcium 8.9 - 10.3 mg/dL 9.6   Total Protein 6.5 - 8.1 g/dL 7.9   Total Bilirubin 0.0 - 1.2 mg/dL 0.6   Alkaline Phos 38 - 126 U/L 54   AST 15 - 41 U/L 15   ALT 0 - 44 U/L 10       Latest Ref Rng & Units 01/30/2024    8:47 AM  CBC  WBC 4.0 - 10.5 K/uL 4.4   Hemoglobin 12.0 - 15.0 g/dL 29.5   Hematocrit 62.1 - 46.0 % 38.0   Platelets 150 - 400 K/uL 237     No images are attached to the encounter.  No results found.   Assessment and plan- Patient is a 73 y.o. female with history of extensive stage small cell lung cancer with liver lymph node and brain metastases.  She is here for on treatment assessment prior to cycle 25 of lurbinectedin  Counts okay to proceed with cycle 25 of lurbinectedin today.  I will see her back in 4 weeks for cycle  25 with scans prior.  She has been getting treatments  every 4 weeks instead of every 3 weeks.  Persistent neutropenia despite growth factor support.  History of renal vein thrombosis continue Eliquis.  Chronic hypokalemia: Continue oral potassium   Visit Diagnosis 1. Small cell carcinoma of lung metastatic to liver (HCC)   2. Encounter for antineoplastic chemotherapy   3. Hypokalemia   4. Current use of long term anticoagulation      Dr. Owens Shark, MD, MPH [facility] at [facility] [phone] 01/30/2024 12:43 PM

## 2024-02-08 DIAGNOSIS — C3490 Malignant neoplasm of unspecified part of unspecified bronchus or lung: Secondary | ICD-10-CM | POA: Diagnosis not present

## 2024-02-08 DIAGNOSIS — Z0000 Encounter for general adult medical examination without abnormal findings: Secondary | ICD-10-CM | POA: Diagnosis not present

## 2024-02-08 DIAGNOSIS — E119 Type 2 diabetes mellitus without complications: Secondary | ICD-10-CM | POA: Diagnosis not present

## 2024-02-08 DIAGNOSIS — I10 Essential (primary) hypertension: Secondary | ICD-10-CM | POA: Diagnosis not present

## 2024-02-08 DIAGNOSIS — J309 Allergic rhinitis, unspecified: Secondary | ICD-10-CM | POA: Diagnosis not present

## 2024-02-08 DIAGNOSIS — E785 Hyperlipidemia, unspecified: Secondary | ICD-10-CM | POA: Diagnosis not present

## 2024-02-08 DIAGNOSIS — Z599 Problem related to housing and economic circumstances, unspecified: Secondary | ICD-10-CM | POA: Diagnosis not present

## 2024-02-15 ENCOUNTER — Telehealth: Payer: Self-pay

## 2024-02-15 ENCOUNTER — Encounter: Payer: Self-pay | Admitting: Oncology

## 2024-02-15 NOTE — Telephone Encounter (Addendum)
 FMLA received via fax for patient's daughter Guy Begin.  Call placed to [facility] for additional information, message left to return call.  Is Monica requesting intermittent or continuous leave to help with her mother?  Last FMLA was for continuous from 11/19/23 to 02/13/24.

## 2024-02-21 ENCOUNTER — Encounter: Payer: Self-pay | Admitting: Oncology

## 2024-02-21 NOTE — Telephone Encounter (Signed)
 Completed pending Dr. Orabelle Rylee Robert signature

## 2024-02-21 NOTE — Telephone Encounter (Signed)
 Completed FMLA faxed to [facility]

## 2024-02-21 NOTE — Telephone Encounter (Signed)
 Attempted to call patient's daughter, Maxine Glenn, again for clarification but voicemail not set up.  Spoke to patient and Maxine Glenn is currently at work.  Will complete form for intermittent FMLA.  Monica did not sign employee portion so Ms. Caskey is going to ask patient to give [place] a call how she would like to have paperwork submitted.  Either pick up in person or come by to sign for [place] to fax.

## 2024-02-27 ENCOUNTER — Encounter: Payer: Self-pay | Admitting: Oncology

## 2024-02-27 ENCOUNTER — Inpatient Hospital Stay: Payer: Medicare HMO

## 2024-02-27 ENCOUNTER — Inpatient Hospital Stay: Payer: Medicare HMO | Attending: Oncology

## 2024-02-27 ENCOUNTER — Inpatient Hospital Stay (HOSPITAL_BASED_OUTPATIENT_CLINIC_OR_DEPARTMENT_OTHER): Payer: Medicare HMO | Attending: Oncology | Admitting: Oncology

## 2024-02-27 VITALS — BP 157/65 | HR 62 | Temp 97.5°F | Resp 19 | Ht 66.0 in | Wt 172.5 lb

## 2024-02-27 DIAGNOSIS — Z79899 Other long term (current) drug therapy: Secondary | ICD-10-CM | POA: Insufficient documentation

## 2024-02-27 DIAGNOSIS — Z87891 Personal history of nicotine dependence: Secondary | ICD-10-CM | POA: Insufficient documentation

## 2024-02-27 DIAGNOSIS — E049 Nontoxic goiter, unspecified: Secondary | ICD-10-CM | POA: Diagnosis not present

## 2024-02-27 DIAGNOSIS — Z7901 Long term (current) use of anticoagulants: Secondary | ICD-10-CM

## 2024-02-27 DIAGNOSIS — C7A1 Malignant poorly differentiated neuroendocrine tumors: Secondary | ICD-10-CM | POA: Insufficient documentation

## 2024-02-27 DIAGNOSIS — Z79630 Long term (current) use of alkylating agent: Secondary | ICD-10-CM | POA: Diagnosis not present

## 2024-02-27 DIAGNOSIS — Z83438 Family history of other disorder of lipoprotein metabolism and other lipidemia: Secondary | ICD-10-CM | POA: Diagnosis not present

## 2024-02-27 DIAGNOSIS — T451X5A Adverse effect of antineoplastic and immunosuppressive drugs, initial encounter: Secondary | ICD-10-CM | POA: Insufficient documentation

## 2024-02-27 DIAGNOSIS — E119 Type 2 diabetes mellitus without complications: Secondary | ICD-10-CM | POA: Insufficient documentation

## 2024-02-27 DIAGNOSIS — Z5111 Encounter for antineoplastic chemotherapy: Secondary | ICD-10-CM

## 2024-02-27 DIAGNOSIS — Z85118 Personal history of other malignant neoplasm of bronchus and lung: Secondary | ICD-10-CM | POA: Insufficient documentation

## 2024-02-27 DIAGNOSIS — D701 Agranulocytosis secondary to cancer chemotherapy: Secondary | ICD-10-CM

## 2024-02-27 DIAGNOSIS — C3490 Malignant neoplasm of unspecified part of unspecified bronchus or lung: Secondary | ICD-10-CM

## 2024-02-27 DIAGNOSIS — C3412 Malignant neoplasm of upper lobe, left bronchus or lung: Secondary | ICD-10-CM | POA: Diagnosis present

## 2024-02-27 DIAGNOSIS — C7B8 Other secondary neuroendocrine tumors: Secondary | ICD-10-CM | POA: Diagnosis not present

## 2024-02-27 DIAGNOSIS — Z803 Family history of malignant neoplasm of breast: Secondary | ICD-10-CM | POA: Insufficient documentation

## 2024-02-27 DIAGNOSIS — Z90710 Acquired absence of both cervix and uterus: Secondary | ICD-10-CM | POA: Diagnosis not present

## 2024-02-27 DIAGNOSIS — C787 Secondary malignant neoplasm of liver and intrahepatic bile duct: Secondary | ICD-10-CM

## 2024-02-27 DIAGNOSIS — Z8349 Family history of other endocrine, nutritional and metabolic diseases: Secondary | ICD-10-CM | POA: Insufficient documentation

## 2024-02-27 DIAGNOSIS — Z8249 Family history of ischemic heart disease and other diseases of the circulatory system: Secondary | ICD-10-CM | POA: Insufficient documentation

## 2024-02-27 DIAGNOSIS — Z86718 Personal history of other venous thrombosis and embolism: Secondary | ICD-10-CM | POA: Diagnosis not present

## 2024-02-27 DIAGNOSIS — D709 Neutropenia, unspecified: Secondary | ICD-10-CM | POA: Diagnosis not present

## 2024-02-27 DIAGNOSIS — Z5986 Financial insecurity: Secondary | ICD-10-CM | POA: Diagnosis not present

## 2024-02-27 LAB — CBC WITH DIFFERENTIAL/PLATELET
Abs Immature Granulocytes: 0 10*3/uL (ref 0.00–0.07)
Basophils Absolute: 0 10*3/uL (ref 0.0–0.1)
Basophils Relative: 0 %
Eosinophils Absolute: 0 10*3/uL (ref 0.0–0.5)
Eosinophils Relative: 1 %
HCT: 34.9 % — ABNORMAL LOW (ref 36.0–46.0)
Hemoglobin: 11.4 g/dL — ABNORMAL LOW (ref 12.0–15.0)
Immature Granulocytes: 0 %
Lymphocytes Relative: 31 %
Lymphs Abs: 0.8 10*3/uL (ref 0.7–4.0)
MCH: 29.9 pg (ref 26.0–34.0)
MCHC: 32.7 g/dL (ref 30.0–36.0)
MCV: 91.6 fL (ref 80.0–100.0)
Monocytes Absolute: 0.3 10*3/uL (ref 0.1–1.0)
Monocytes Relative: 11 %
Neutro Abs: 1.4 10*3/uL — ABNORMAL LOW (ref 1.7–7.7)
Neutrophils Relative %: 57 %
Platelets: 175 10*3/uL (ref 150–400)
RBC: 3.81 MIL/uL — ABNORMAL LOW (ref 3.87–5.11)
RDW: 14.9 % (ref 11.5–15.5)
WBC: 2.5 10*3/uL — ABNORMAL LOW (ref 4.0–10.5)
nRBC: 0 % (ref 0.0–0.2)

## 2024-02-27 LAB — COMPREHENSIVE METABOLIC PANEL WITH GFR
ALT: 9 U/L (ref 0–44)
AST: 13 U/L — ABNORMAL LOW (ref 15–41)
Albumin: 3.8 g/dL (ref 3.5–5.0)
Alkaline Phosphatase: 54 U/L (ref 38–126)
Anion gap: 7 (ref 5–15)
BUN: 14 mg/dL (ref 8–23)
CO2: 23 mmol/L (ref 22–32)
Calcium: 9.3 mg/dL (ref 8.9–10.3)
Chloride: 110 mmol/L (ref 98–111)
Creatinine, Ser: 0.55 mg/dL (ref 0.44–1.00)
GFR, Estimated: >60 mL/min (ref >60)
Glucose, Bld: 104 mg/dL — ABNORMAL HIGH (ref 70–99)
Potassium: 3.5 mmol/L (ref 3.5–5.1)
Sodium: 140 mmol/L (ref 135–145)
Total Bilirubin: 0.7 mg/dL (ref 0.0–1.2)
Total Protein: 7.1 g/dL (ref 6.5–8.1)

## 2024-02-27 MED ORDER — HEPARIN SOD (PORK) LOCK FLUSH 100 UNIT/ML IV SOLN
500.0000 [IU] | Freq: Once | INTRAVENOUS | Status: AC | PRN
  Administered 2024-02-27: 500 [IU]
  Filled 2024-02-27: qty 5

## 2024-02-27 MED ORDER — PALONOSETRON HCL INJECTION 0.25 MG/5ML
0.2500 mg | Freq: Once | INTRAVENOUS | Status: AC
  Administered 2024-02-27: 0.25 mg via INTRAVENOUS
  Filled 2024-02-27: qty 5

## 2024-02-27 MED ORDER — PEGFILGRASTIM 6 MG/0.6ML ~~LOC~~ PSKT
6.0000 mg | PREFILLED_SYRINGE | Freq: Once | SUBCUTANEOUS | Status: AC
Start: 2024-02-27 — End: 2024-02-27
  Administered 2024-02-27: 6 mg via SUBCUTANEOUS
  Filled 2024-02-27: qty 0.6

## 2024-02-27 MED ORDER — SODIUM CHLORIDE 0.9 % IV SOLN
Freq: Once | INTRAVENOUS | Status: AC
Start: 2024-02-27 — End: 2024-02-27
  Filled 2024-02-27: qty 250

## 2024-02-27 MED ORDER — SODIUM CHLORIDE 0.9 % IV SOLN
2.5600 mg/m2 | Freq: Once | INTRAVENOUS | Status: AC
  Administered 2024-02-27: 4.6 mg via INTRAVENOUS
  Filled 2024-02-27: qty 9.2

## 2024-02-27 MED ORDER — DEXAMETHASONE SODIUM PHOSPHATE 10 MG/ML IJ SOLN
10.0000 mg | Freq: Once | INTRAMUSCULAR | Status: AC
  Administered 2024-02-27: 10 mg via INTRAVENOUS
  Filled 2024-02-27: qty 1

## 2024-02-27 NOTE — Addendum Note (Signed)
 Addended by: Marilyn Shropshire on: 02/27/2024 09:55 AM   Modules accepted: Orders

## 2024-02-27 NOTE — Progress Notes (Signed)
 Patient tolerated chemotherapy with no complaints voiced.  Side effects with management reviewed with understanding verbalized.  Port site clean and dry with no bruising or swelling noted at site.  Good blood return noted before and after administration of chemotherapy.  Band aid applied.  Patient left in satisfactory condition with VSS and no s/s of distress noted. All follow ups as scheduled.   Venkat Ankney [employer]

## 2024-02-27 NOTE — Progress Notes (Signed)
 Hematology/Oncology Consult note [facility]  Telephone:([phone] [phone]  Patient Care Team: Mick Sell, MD as PCP - General (Infectious Diseases) Glory Buff, RN as Oncology Nurse Navigator Creig Hines, MD as Consulting Physician (Hematology and Oncology)   Name of the patient: Doris Johnson  [id number]  1951/03/23   Date of visit: 02/27/24  Diagnosis-  extensive stage small cell lung cancer with liver lymph node and brain metastases   Chief complaint/ Reason for visit-on treatment assessment prior to cycle 26 of lurbinectedin  Heme/Onc history: Patient is a 73 year old female with a remote history of smoking in her teenage years and exposure to passive smoking.  She finally had a CT chest with contrast done in October 2021 which showed a large mass in the left side of the mediastinum measuring 6.7 x 6.2 x 6.1 cm causing severe compression of the left main pulmonary artery and around the aortic arch in the left subclavian artery.  Enlarged thyroid gland.  Left upper lobe nodule 1 x 0.7 cm.  She then underwent bronchoscopy with Dr.Aleskerov left upper lobe biopsy was nondiagnostic.  However station 4, 7 and 10 L lymph nodes were consistent with metastatic small cell carcinoma.   PET CT scan showed enlarged level 3 cervical lymph node 2.2 cm that was hypermetabolic with an SUV of 9.7.  Large central 7.3 cm AP window mass.  5.7 cm left liver mass.  MRI brain with and without contrast showed 2 small foci of enhancement in the right cerebellar hemisphere and right temporal lobe without mass-effect or edema as well concerning for brain metastases   Patient had 2 cycles of carbo etoposide chemotherapy along with Tecentriq and subsequent scan showed no significant improvement in the primary lung mass or liver mass.  She received radiation treatment to her primary lung mass and also seen by [facility] for second opinion.  Her pathology was reviewed  at [facility] and reported as possible neuroendocrine neoplasm But stated that small cell carcinoma cannot be excluded but not definitely diagnostic for it.  However [facility] pathology feels that this is consistent with small cell lung cancer.  She has completed 4 cycles of carbo etoposide Tecentriq chemotherapy and is currently on maintenance Tecentriq.  Repeat liver biopsy was also performed and was consistent with small cell lung cancer   Disease progression after 23 cycles of maintenance Tecentriq in the left supraclavicular lymph node region.  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  Patient has been on lurbinectedin since November 2023  Interval history-she feels well overall and denies any complaints at this time.  No recent infections cough shortness of breath nausea vomiting or diarrhea  ECOG PS- 1 Pain scale- 0   Review of systems- Review of Systems  Constitutional:  Negative for chills, fever, malaise/fatigue and weight loss.  HENT:  Negative for congestion, ear discharge and nosebleeds.   Eyes:  Negative for blurred vision.  Respiratory:  Negative for cough, hemoptysis, sputum production, shortness of breath and wheezing.   Cardiovascular:  Negative for chest pain, palpitations, orthopnea and claudication.  Gastrointestinal:  Negative for abdominal pain, blood in stool, constipation, diarrhea, heartburn, melena, nausea and vomiting.  Genitourinary:  Negative for dysuria, flank pain, frequency, hematuria and urgency.  Musculoskeletal:  Negative for back pain, joint pain and myalgias.  Skin:  Negative for rash.  Neurological:  Negative for dizziness, tingling, focal weakness, seizures, weakness and headaches.  Endo/Heme/Allergies:  Does not bruise/bleed easily.  Psychiatric/Behavioral:  Negative for depression and suicidal ideas. The patient does not have insomnia.       No Known Allergies   Past Medical History:  Diagnosis Date   Cancer (HCC)     Cataract    Diabetes mellitus without complication (HCC)    Hyperlipidemia    Hypertension    Hypothyroidism    doctor's keeping an eye on thyroid    Lung cancer [facility])      Past Surgical History:  Procedure Laterality Date   ABDOMINAL HYSTERECTOMY     IR CV LINE INJECTION  09/28/2021   MYRINGOTOMY WITH TUBE PLACEMENT Right 12/29/2022   Procedure: MYRINGOTOMY WITH TUBE PLACEMENT;  Surgeon: Vernie Murders, MD;  Location: [facility];  Service: ENT;  Laterality: Right;  Diabetic   PORTA CATH INSERTION N/A 11/30/2020   Procedure: PORTA CATH INSERTION;  Surgeon: Annice Needy, MD;  Location: [facility] INVASIVE CV LAB;  Service: Cardiovascular;  Laterality: N/A;   VIDEO BRONCHOSCOPY WITH ENDOBRONCHIAL NAVIGATION N/A 10/21/2020   Procedure: VIDEO BRONCHOSCOPY WITH ENDOBRONCHIAL NAVIGATION;  Surgeon: Vida Rigger, MD;  Location: [facility] ORS;  Service: Thoracic;  Laterality: N/A;   VIDEO BRONCHOSCOPY WITH ENDOBRONCHIAL ULTRASOUND N/A 10/21/2020   Procedure: VIDEO BRONCHOSCOPY WITH ENDOBRONCHIAL ULTRASOUND;  Surgeon: Vida Rigger, MD;  Location: [facility] ORS;  Service: Thoracic;  Laterality: N/A;    Social History   Socioeconomic History   Marital status: Single    Spouse name: Not on file   Number of children: Not on file   Years of education: Not on file   Highest education level: Not on file  Occupational History   Not on file  Tobacco Use   Smoking status: Never   Smokeless tobacco: Never  Vaping Use   Vaping status: Never Used  Substance and Sexual Activity   Alcohol use: No   Drug use: No   Sexual activity: Not Currently  Other Topics Concern   Not on file  Social History Narrative   Not on file   Social Drivers of Health   Financial Resource Strain: High Risk (02/08/2024)   Received from [facility]   Overall Financial Resource Strain (CARDIA)    Difficulty of Paying Living Expenses: Hard  Food Insecurity: Food Insecurity Present (02/08/2024)   Received  from [facility]   Hunger Vital Sign    Worried About Running Out of Food in the Last Year: Sometimes true    Ran Out of Food in the Last Year: Sometimes true  Transportation Needs: No Transportation Needs (02/08/2024)   Received from [facility]   PRAPARE - Transportation    In the past 12 months, has lack of transportation kept you from medical appointments or from getting medications?: No    Lack of Transportation (Non-Medical): No  Physical Activity: Not on file  Stress: Not on file  Social Connections: Not on file  Intimate Partner Violence: Not on file    Family History  Problem Relation Age of Onset   Cancer Mother        breast   Hypertension Mother    Breast cancer Mother 50   Heart disease Father    Hyperlipidemia Brother    Heart disease Brother      Current Outpatient Medications:    apixaban (ELIQUIS) 5 MG TABS tablet, Take 1 tablet (5 mg total) by mouth 2 (two) times daily., Disp: 60 tablet, Rfl: 1   fluticasone (FLONASE) 50 MCG/ACT nasal spray, Place 2 sprays into both  nostrils daily., Disp: 16 g, Rfl: 5   furosemide (LASIX) 20 MG tablet, Take 20 mg by mouth., Disp: , Rfl:    lidocaine-prilocaine (EMLA) cream, Apply 1 Application topically as needed. Apply small amount to port site at least 1 hour prior to it being accessed, cover with plastic wrap, Disp: 30 g, Rfl: 1   losartan (COZAAR) 25 MG tablet, Take 1 tablet by mouth daily., Disp: , Rfl:    mirtazapine (REMERON SOL-TAB) 15 MG disintegrating tablet, Take 15 mg by mouth at bedtime., Disp: , Rfl:    potassium chloride SA (KLOR-CON M) 20 MEQ tablet, TAKE 1 TABLET(20 MEQ) BY MOUTH DAILY, Disp: 90 tablet, Rfl: 2   traZODone (DESYREL) 50 MG tablet, Take 50 mg by mouth at bedtime., Disp: , Rfl:  No current facility-administered medications for this visit.  Facility-Administered Medications Ordered in Other Visits:    sodium chloride flush (NS) 0.9 % injection 10 mL, 10 mL,  Intravenous, PRN, Creig Hines, MD, 10 mL at 12/15/20 0850   sodium chloride flush (NS) 0.9 % injection 10 mL, 10 mL, Intravenous, PRN, Creig Hines, MD, 10 mL at 03/07/23 0923  Physical exam:  Vitals:   02/27/24 0926  BP: (!) 157/65  Pulse: 62  Resp: 19  Temp: (!) 97.5 F (36.4 C)  TempSrc: Tympanic  SpO2: 100%  Weight: 172 lb 8 oz (78.2 kg)  Height: 5\' 6"  (1.676 m)   Physical Exam Cardiovascular:     Rate and Rhythm: Normal rate and regular rhythm.     Heart sounds: Normal heart sounds.  Pulmonary:     Effort: Pulmonary effort is normal.     Breath sounds: Normal breath sounds.  Abdominal:     General: Bowel sounds are normal.     Palpations: Abdomen is soft.  Skin:    General: Skin is warm and dry.  Neurological:     Mental Status: She is alert and oriented to person, place, and time.      I have personally reviewed labs listed below:    Latest Ref Rng & Units 01/30/2024    8:47 AM  CMP  Glucose 70 - 99 mg/dL 119   BUN 8 - 23 mg/dL 12   Creatinine 1.47 - 1.00 mg/dL 8.29   Sodium 562 - 130 mmol/L 137   Potassium 3.5 - 5.1 mmol/L 3.2   Chloride 98 - 111 mmol/L 104   CO2 22 - 32 mmol/L 25   Calcium 8.9 - 10.3 mg/dL 9.6   Total Protein 6.5 - 8.1 g/dL 7.9   Total Bilirubin 0.0 - 1.2 mg/dL 0.6   Alkaline Phos 38 - 126 U/L 54   AST 15 - 41 U/L 15   ALT 0 - 44 U/L 10       Latest Ref Rng & Units 01/30/2024    8:47 AM  CBC  WBC 4.0 - 10.5 K/uL 4.4   Hemoglobin 12.0 - 15.0 g/dL 86.5   Hematocrit 78.4 - 46.0 % 38.0   Platelets 150 - 400 K/uL 237    I have personally reviewed Radiology images listed below: No images are attached to the encounter.  No results found.   Assessment and plan- Patient is a 73 y.o. female with history of extensive stage small cell lung cancer with liver, supraclavicular lymph node and brain metastases.  She is here for on treatment assessment prior to cycle 26 of lurbinectedin  Counts okay to proceed with cycle 26 of  lurbinectedin today.  I will see her back in 4 weeks for cycle 27.  Unclear as to why the CT scan was not done as she was supposed to get 1 prior to her visit with me.  We will get CT scan scheduled ASAP.  She is getting treatment every 4 weeks due to neutropenia despite G-CSF support.  History of renal vein thrombosis: Continue Eliquis  Brain metastases: Last MRI was in March 2020 for which showed decreased size of multiple metastases and no new lesions.  I will repeat an MRI at this time   Visit Diagnosis 1. Encounter for antineoplastic chemotherapy   2. Chemotherapy induced neutropenia (HCC)   3. Small cell carcinoma of lung metastatic to liver (HCC)   4. Current use of long term anticoagulation      Dr. Seretha Dance, MD, MPH [facility] [phone] 02/27/2024 9:38 AM

## 2024-03-05 ENCOUNTER — Ambulatory Visit
Admission: RE | Admit: 2024-03-05 | Discharge: 2024-03-05 | Disposition: A | Discharge status: Home / Self Care | Source: Ambulatory Visit | Attending: Oncology | Admitting: Oncology

## 2024-03-05 DIAGNOSIS — C342 Malignant neoplasm of middle lobe, bronchus or lung: Secondary | ICD-10-CM | POA: Diagnosis not present

## 2024-03-05 DIAGNOSIS — C3490 Malignant neoplasm of unspecified part of unspecified bronchus or lung: Secondary | ICD-10-CM | POA: Diagnosis not present

## 2024-03-05 DIAGNOSIS — C787 Secondary malignant neoplasm of liver and intrahepatic bile duct: Secondary | ICD-10-CM | POA: Diagnosis not present

## 2024-03-05 DIAGNOSIS — J8410 Pulmonary fibrosis, unspecified: Secondary | ICD-10-CM | POA: Diagnosis not present

## 2024-03-05 DIAGNOSIS — Z85118 Personal history of other malignant neoplasm of bronchus and lung: Secondary | ICD-10-CM | POA: Diagnosis not present

## 2024-03-05 DIAGNOSIS — G936 Cerebral edema: Secondary | ICD-10-CM | POA: Diagnosis not present

## 2024-03-05 DIAGNOSIS — R590 Localized enlarged lymph nodes: Secondary | ICD-10-CM | POA: Diagnosis not present

## 2024-03-05 MED ORDER — GADOBUTROL 1 MMOL/ML IV SOLN
7.5000 mL | Freq: Once | INTRAVENOUS | Status: AC | PRN
  Administered 2024-03-05: 7.5 mL via INTRAVENOUS

## 2024-03-05 MED ORDER — IOHEXOL 300 MG/ML  SOLN
100.0000 mL | Freq: Once | INTRAMUSCULAR | Status: AC | PRN
  Administered 2024-03-05: 100 mL via INTRAVENOUS

## 2024-03-20 ENCOUNTER — Encounter: Payer: Self-pay | Admitting: Oncology

## 2024-03-26 ENCOUNTER — Other Ambulatory Visit: Payer: Self-pay | Admitting: *Deleted

## 2024-03-26 ENCOUNTER — Encounter: Payer: Self-pay | Admitting: Oncology

## 2024-03-26 ENCOUNTER — Inpatient Hospital Stay: Attending: Oncology

## 2024-03-26 ENCOUNTER — Inpatient Hospital Stay (HOSPITAL_BASED_OUTPATIENT_CLINIC_OR_DEPARTMENT_OTHER): Admitting: Oncology

## 2024-03-26 ENCOUNTER — Inpatient Hospital Stay

## 2024-03-26 ENCOUNTER — Other Ambulatory Visit: Payer: Self-pay | Admitting: Radiation Therapy

## 2024-03-26 ENCOUNTER — Ambulatory Visit: Payer: Self-pay | Admitting: Oncology

## 2024-03-26 ENCOUNTER — Telehealth: Payer: Self-pay | Admitting: *Deleted

## 2024-03-26 VITALS — BP 175/78 | HR 53 | Temp 97.0°F | Resp 18 | Ht 66.0 in | Wt 171.0 lb

## 2024-03-26 DIAGNOSIS — M51379 Other intervertebral disc degeneration, lumbosacral region without mention of lumbar back pain or lower extremity pain: Secondary | ICD-10-CM | POA: Diagnosis not present

## 2024-03-26 DIAGNOSIS — E049 Nontoxic goiter, unspecified: Secondary | ICD-10-CM | POA: Insufficient documentation

## 2024-03-26 DIAGNOSIS — C787 Secondary malignant neoplasm of liver and intrahepatic bile duct: Secondary | ICD-10-CM

## 2024-03-26 DIAGNOSIS — T451X5A Adverse effect of antineoplastic and immunosuppressive drugs, initial encounter: Secondary | ICD-10-CM

## 2024-03-26 DIAGNOSIS — R590 Localized enlarged lymph nodes: Secondary | ICD-10-CM | POA: Diagnosis not present

## 2024-03-26 DIAGNOSIS — R160 Hepatomegaly, not elsewhere classified: Secondary | ICD-10-CM | POA: Insufficient documentation

## 2024-03-26 DIAGNOSIS — E119 Type 2 diabetes mellitus without complications: Secondary | ICD-10-CM | POA: Diagnosis not present

## 2024-03-26 DIAGNOSIS — Z5986 Financial insecurity: Secondary | ICD-10-CM | POA: Insufficient documentation

## 2024-03-26 DIAGNOSIS — Z7952 Long term (current) use of systemic steroids: Secondary | ICD-10-CM | POA: Insufficient documentation

## 2024-03-26 DIAGNOSIS — Z5111 Encounter for antineoplastic chemotherapy: Secondary | ICD-10-CM

## 2024-03-26 DIAGNOSIS — J849 Interstitial pulmonary disease, unspecified: Secondary | ICD-10-CM | POA: Diagnosis not present

## 2024-03-26 DIAGNOSIS — D701 Agranulocytosis secondary to cancer chemotherapy: Secondary | ICD-10-CM

## 2024-03-26 DIAGNOSIS — Z8249 Family history of ischemic heart disease and other diseases of the circulatory system: Secondary | ICD-10-CM | POA: Diagnosis not present

## 2024-03-26 DIAGNOSIS — C3412 Malignant neoplasm of upper lobe, left bronchus or lung: Secondary | ICD-10-CM | POA: Insufficient documentation

## 2024-03-26 DIAGNOSIS — Z86718 Personal history of other venous thrombosis and embolism: Secondary | ICD-10-CM | POA: Diagnosis not present

## 2024-03-26 DIAGNOSIS — Z87891 Personal history of nicotine dependence: Secondary | ICD-10-CM | POA: Insufficient documentation

## 2024-03-26 DIAGNOSIS — Z79899 Other long term (current) drug therapy: Secondary | ICD-10-CM | POA: Diagnosis not present

## 2024-03-26 DIAGNOSIS — Z7901 Long term (current) use of anticoagulants: Secondary | ICD-10-CM | POA: Insufficient documentation

## 2024-03-26 DIAGNOSIS — Z83438 Family history of other disorder of lipoprotein metabolism and other lipidemia: Secondary | ICD-10-CM | POA: Insufficient documentation

## 2024-03-26 DIAGNOSIS — Z90710 Acquired absence of both cervix and uterus: Secondary | ICD-10-CM | POA: Insufficient documentation

## 2024-03-26 DIAGNOSIS — Z803 Family history of malignant neoplasm of breast: Secondary | ICD-10-CM | POA: Diagnosis not present

## 2024-03-26 DIAGNOSIS — C3490 Malignant neoplasm of unspecified part of unspecified bronchus or lung: Secondary | ICD-10-CM | POA: Diagnosis not present

## 2024-03-26 DIAGNOSIS — C7931 Secondary malignant neoplasm of brain: Secondary | ICD-10-CM | POA: Diagnosis not present

## 2024-03-26 DIAGNOSIS — C779 Secondary and unspecified malignant neoplasm of lymph node, unspecified: Secondary | ICD-10-CM | POA: Diagnosis not present

## 2024-03-26 LAB — CBC WITH DIFFERENTIAL/PLATELET
Abs Immature Granulocytes: 0 10*3/uL (ref 0.00–0.07)
Basophils Absolute: 0 10*3/uL (ref 0.0–0.1)
Basophils Relative: 1 %
Eosinophils Absolute: 0 10*3/uL (ref 0.0–0.5)
Eosinophils Relative: 2 %
HCT: 33.6 % — ABNORMAL LOW (ref 36.0–46.0)
Hemoglobin: 11.3 g/dL — ABNORMAL LOW (ref 12.0–15.0)
Immature Granulocytes: 0 %
Lymphocytes Relative: 33 %
Lymphs Abs: 0.7 10*3/uL (ref 0.7–4.0)
MCH: 30.5 pg (ref 26.0–34.0)
MCHC: 33.6 g/dL (ref 30.0–36.0)
MCV: 90.8 fL (ref 80.0–100.0)
Monocytes Absolute: 0.3 10*3/uL (ref 0.1–1.0)
Monocytes Relative: 13 %
Neutro Abs: 1.1 10*3/uL — ABNORMAL LOW (ref 1.7–7.7)
Neutrophils Relative %: 51 %
Platelets: 207 10*3/uL (ref 150–400)
RBC: 3.7 MIL/uL — ABNORMAL LOW (ref 3.87–5.11)
RDW: 15.6 % — ABNORMAL HIGH (ref 11.5–15.5)
WBC: 2 10*3/uL — ABNORMAL LOW (ref 4.0–10.5)
nRBC: 0 % (ref 0.0–0.2)

## 2024-03-26 LAB — COMPREHENSIVE METABOLIC PANEL WITH GFR
ALT: 10 U/L (ref 0–44)
AST: 14 U/L — ABNORMAL LOW (ref 15–41)
Albumin: 3.9 g/dL (ref 3.5–5.0)
Alkaline Phosphatase: 56 U/L (ref 38–126)
Anion gap: 7 (ref 5–15)
BUN: 11 mg/dL (ref 8–23)
CO2: 24 mmol/L (ref 22–32)
Calcium: 9.2 mg/dL (ref 8.9–10.3)
Chloride: 109 mmol/L (ref 98–111)
Creatinine, Ser: 0.65 mg/dL (ref 0.44–1.00)
GFR, Estimated: >60 mL/min (ref >60)
Glucose, Bld: 98 mg/dL (ref 70–99)
Potassium: 3.4 mmol/L — ABNORMAL LOW (ref 3.5–5.1)
Sodium: 140 mmol/L (ref 135–145)
Total Bilirubin: 0.6 mg/dL (ref 0.0–1.2)
Total Protein: 7.3 g/dL (ref 6.5–8.1)

## 2024-03-26 MED ORDER — PEGFILGRASTIM 6 MG/0.6ML ~~LOC~~ PSKT
6.0000 mg | PREFILLED_SYRINGE | Freq: Once | SUBCUTANEOUS | Status: AC
  Administered 2024-03-26: 6 mg via SUBCUTANEOUS
  Filled 2024-03-26: qty 0.6

## 2024-03-26 MED ORDER — DEXAMETHASONE SODIUM PHOSPHATE 10 MG/ML IJ SOLN
10.0000 mg | Freq: Once | INTRAMUSCULAR | Status: AC
  Administered 2024-03-26: 10 mg via INTRAVENOUS
  Filled 2024-03-26: qty 1

## 2024-03-26 MED ORDER — SODIUM CHLORIDE 0.9% FLUSH
10.0000 mL | INTRAVENOUS | Status: DC | PRN
  Filled 2024-03-26: qty 10

## 2024-03-26 MED ORDER — PALONOSETRON HCL INJECTION 0.25 MG/5ML
0.2500 mg | Freq: Once | INTRAVENOUS | Status: AC
  Administered 2024-03-26: 0.25 mg via INTRAVENOUS
  Filled 2024-03-26: qty 5

## 2024-03-26 MED ORDER — HEPARIN SOD (PORK) LOCK FLUSH 100 UNIT/ML IV SOLN
500.0000 [IU] | Freq: Once | INTRAVENOUS | Status: AC | PRN
  Administered 2024-03-26: 500 [IU]
  Filled 2024-03-26: qty 5

## 2024-03-26 MED ORDER — DEXAMETHASONE 4 MG PO TABS: 4.0000 mg | ORAL_TABLET | Freq: Two times a day (BID) | ORAL | 2 refills | Status: AC

## 2024-03-26 MED ORDER — LURBINECTEDIN CHEMO IV INJECTION 4 MG
2.5600 mg/m2 | Freq: Once | INTRAVENOUS | Status: AC
  Administered 2024-03-26: 4.6 mg via INTRAVENOUS
  Filled 2024-03-26: qty 9.2

## 2024-03-26 MED ORDER — SODIUM CHLORIDE 0.9 % IV SOLN
Freq: Once | INTRAVENOUS | Status: AC
  Filled 2024-03-26: qty 250

## 2024-03-26 NOTE — Progress Notes (Addendum)
 Hematology/Oncology Consult note [facility]  Telephone:([phone] [phone]  Patient Care Team: Eartha Gold, MD as PCP - General (Infectious Diseases) Drake Gens, RN as Oncology Nurse Navigator Avonne Boettcher, MD as Consulting Physician (Hematology and Oncology)   Name of the patient: Doris Johnson  [id number]  08/05/1951   Date of visit: 03/26/24  Diagnosis- extensive stage small cell lung cancer with liver lymph node and brain metastases   Chief complaint/ Reason for visit-on treatment assessment prior to cycle 27 of lurbinectedin   Heme/Onc history: Patient is a 73 year old female with a remote history of smoking in her teenage years and exposure to passive smoking.  She finally had a CT chest with contrast done in October 2021 which showed a large mass in the left side of the mediastinum measuring 6.7 x 6.2 x 6.1 cm causing severe compression of the left main pulmonary artery and around the aortic arch in the left subclavian artery.  Enlarged thyroid  gland.  Left upper lobe nodule 1 x 0.7 cm.  She then underwent bronchoscopy with Dr.Aleskerov left upper lobe biopsy was nondiagnostic.  However station 4, 7 and 10 L lymph nodes were consistent with metastatic small cell carcinoma.   PET CT scan showed enlarged level 3 cervical lymph node 2.2 cm that was hypermetabolic with an SUV of 9.7.  Large central 7.3 cm AP window mass.  5.7 cm left liver mass.  MRI brain with and without contrast showed 2 small foci of enhancement in the right cerebellar hemisphere and right temporal lobe without mass-effect or edema as well concerning for brain metastases   Patient had 2 cycles of carbo etoposide  chemotherapy along with Tecentriq  and subsequent scan showed no significant improvement in the primary lung mass or liver mass.  She received radiation treatment to her primary lung mass and also seen by [facility] for second opinion.  Her pathology was reviewed at  [facility] and reported as possible neuroendocrine neoplasm But stated that small cell carcinoma cannot be excluded but not definitely diagnostic for it.  However [facility] pathology feels that this is consistent with small cell lung cancer.  She has completed 4 cycles of carbo etoposide  Tecentriq  chemotherapy and is currently on maintenance Tecentriq .  Repeat liver biopsy was also performed and was consistent with small cell lung cancer   Disease progression after 23 cycles of maintenance Tecentriq  in the left supraclavicular lymph node region.  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  Patient has been on lurbinectedin  since November 2023    Interval history-patient reports some baseline fatigue but is otherwise tolerating chemotherapy well so far without any significant side effects.  Appetite and weight have remained stable.  ECOG PS- 2 Pain scale- 0   Review of systems- Review of Systems  Constitutional:  Positive for malaise/fatigue. Negative for chills, fever and weight loss.  HENT:  Negative for congestion, ear discharge and nosebleeds.   Eyes:  Negative for blurred vision.  Respiratory:  Negative for cough, hemoptysis, sputum production, shortness of breath and wheezing.   Cardiovascular:  Negative for chest pain, palpitations, orthopnea and claudication.  Gastrointestinal:  Negative for abdominal pain, blood in stool, constipation, diarrhea, heartburn, melena, nausea and vomiting.  Genitourinary:  Negative for dysuria, flank pain, frequency, hematuria and urgency.  Musculoskeletal:  Negative for back pain, joint pain and myalgias.  Skin:  Negative for rash.  Neurological:  Negative for dizziness, tingling, focal weakness, seizures, weakness and headaches.  Endo/Heme/Allergies:  Does  not bruise/bleed easily.  Psychiatric/Behavioral:  Negative for depression and suicidal ideas. The patient does not have insomnia.       No Known Allergies   Past Medical  History:  Diagnosis Date   Cancer (HCC)    Cataract    Diabetes mellitus without complication (HCC)    Hyperlipidemia    Hypertension    Hypothyroidism    doctor's keeping an eye on thyroid     Lung cancer [facility])      Past Surgical History:  Procedure Laterality Date   ABDOMINAL HYSTERECTOMY     IR CV LINE INJECTION  09/28/2021   MYRINGOTOMY WITH TUBE PLACEMENT Right 12/29/2022   Procedure: MYRINGOTOMY WITH TUBE PLACEMENT;  Surgeon: Juengel, Paul, MD;  Location: [facility];  Service: ENT;  Laterality: Right;  Diabetic   PORTA CATH INSERTION N/A 11/30/2020   Procedure: PORTA CATH INSERTION;  Surgeon: Celso College, MD;  Location: [facility] INVASIVE CV LAB;  Service: Cardiovascular;  Laterality: N/A;   VIDEO BRONCHOSCOPY WITH ENDOBRONCHIAL NAVIGATION N/A 10/21/2020   Procedure: VIDEO BRONCHOSCOPY WITH ENDOBRONCHIAL NAVIGATION;  Surgeon: Erskin Hearing, MD;  Location: [facility] ORS;  Service: Thoracic;  Laterality: N/A;   VIDEO BRONCHOSCOPY WITH ENDOBRONCHIAL ULTRASOUND N/A 10/21/2020   Procedure: VIDEO BRONCHOSCOPY WITH ENDOBRONCHIAL ULTRASOUND;  Surgeon: Erskin Hearing, MD;  Location: [facility] ORS;  Service: Thoracic;  Laterality: N/A;    Social History   Socioeconomic History   Marital status: Single    Spouse name: Not on file   Number of children: Not on file   Years of education: Not on file   Highest education level: Not on file  Occupational History   Not on file  Tobacco Use   Smoking status: Never   Smokeless tobacco: Never  Vaping Use   Vaping status: Never Used  Substance and Sexual Activity   Alcohol use: No   Drug use: No   Sexual activity: Not Currently  Other Topics Concern   Not on file  Social History Narrative   Not on file   Social Drivers of Health   Financial Resource Strain: High Risk (02/08/2024)   Received from [facility]   Overall Financial Resource Strain (CARDIA)    Difficulty of Paying Living Expenses: Hard  Food Insecurity:  Food Insecurity Present (02/08/2024)   Received from [facility]   Hunger Vital Sign    Worried About Running Out of Food in the Last Year: Sometimes true    Ran Out of Food in the Last Year: Sometimes true  Transportation Needs: No Transportation Needs (02/08/2024)   Received from [facility]   PRAPARE - Transportation    In the past 12 months, has lack of transportation kept you from medical appointments or from getting medications?: No    Lack of Transportation (Non-Medical): No  Physical Activity: Not on file  Stress: Not on file  Social Connections: Not on file  Intimate Partner Violence: Not on file    Family History  Problem Relation Age of Onset   Cancer Mother        breast   Hypertension Mother    Breast cancer Mother 70   Heart disease Father    Hyperlipidemia Brother    Heart disease Brother      Current Outpatient Medications:    apixaban  (ELIQUIS ) 5 MG TABS tablet, Take 1 tablet (5 mg total) by mouth 2 (two) times daily., Disp: 60 tablet, Rfl: 1   fluticasone  (FLONASE ) 50 MCG/ACT nasal  spray, Place 2 sprays into both nostrils daily., Disp: 16 g, Rfl: 5   furosemide  (LASIX ) 20 MG tablet, Take 20 mg by mouth., Disp: , Rfl:    lidocaine -prilocaine  (EMLA ) cream, Apply 1 Application topically as needed. Apply small amount to port site at least 1 hour prior to it being accessed, cover with plastic wrap, Disp: 30 g, Rfl: 1   losartan (COZAAR) 25 MG tablet, Take 1 tablet by mouth daily., Disp: , Rfl:    metFORMIN (GLUCOPHAGE-XR) 500 MG 24 hr tablet, Take by mouth., Disp: , Rfl:    mirtazapine (REMERON SOL-TAB) 15 MG disintegrating tablet, Take 15 mg by mouth at bedtime., Disp: , Rfl:    potassium chloride  SA (KLOR-CON  M) 20 MEQ tablet, TAKE 1 TABLET(20 MEQ) BY MOUTH DAILY, Disp: 90 tablet, Rfl: 2   traZODone (DESYREL) 50 MG tablet, Take 50 mg by mouth at bedtime., Disp: , Rfl:  No current facility-administered medications for this  visit.  Facility-Administered Medications Ordered in Other Visits:    sodium chloride  flush (NS) 0.9 % injection 10 mL, 10 mL, Intravenous, PRN, Avonne Boettcher, MD, 10 mL at 12/15/20 0850   sodium chloride  flush (NS) 0.9 % injection 10 mL, 10 mL, Intravenous, PRN, Avonne Boettcher, MD, 10 mL at 03/07/23 1610  Physical exam: There were no vitals filed for this visit. Physical Exam Cardiovascular:     Rate and Rhythm: Normal rate and regular rhythm.     Heart sounds: Normal heart sounds.  Pulmonary:     Effort: Pulmonary effort is normal.     Breath sounds: Normal breath sounds.  Abdominal:     General: Bowel sounds are normal.     Palpations: Abdomen is soft.  Skin:    General: Skin is warm and dry.  Neurological:     Mental Status: She is alert and oriented to person, place, and time.      I have personally reviewed labs listed below:    Latest Ref Rng & Units 02/27/2024    9:13 AM  CMP  Glucose 70 - 99 mg/dL 960   BUN 8 - 23 mg/dL 14   Creatinine 4.54 - 1.00 mg/dL 0.98   Sodium 119 - 147 mmol/L 140   Potassium 3.5 - 5.1 mmol/L 3.5   Chloride 98 - 111 mmol/L 110   CO2 22 - 32 mmol/L 23   Calcium 8.9 - 10.3 mg/dL 9.3   Total Protein 6.5 - 8.1 g/dL 7.1   Total Bilirubin 0.0 - 1.2 mg/dL 0.7   Alkaline Phos 38 - 126 U/L 54   AST 15 - 41 U/L 13   ALT 0 - 44 U/L 9       Latest Ref Rng & Units 02/27/2024    9:13 AM  CBC  WBC 4.0 - 10.5 K/uL 2.5   Hemoglobin 12.0 - 15.0 g/dL 82.9   Hematocrit 56.2 - 46.0 % 34.9   Platelets 150 - 400 K/uL 175    I have personally reviewed Radiology images listed below: No images are attached to the encounter.  CT CHEST ABDOMEN PELVIS W CONTRAST Result Date: 03/05/2024 CLINICAL DATA:  Small-cell lung cancer with metastatic disease EXAM: CT CHEST, ABDOMEN, AND PELVIS WITH CONTRAST TECHNIQUE: Multidetector CT imaging of the chest, abdomen and pelvis was performed following the standard protocol during bolus administration of intravenous  contrast. RADIATION DOSE REDUCTION: This exam was performed according to the departmental dose-optimization program which includes automated exposure control, adjustment of the mA and/or kV  according to patient size and/or use of iterative reconstruction technique. CONTRAST:  OMNIPAQUE  IOHEXOL  300 MG/ML  SOLN COMPARISON:  CT chest October 17, 2023. FINDINGS: CT CHEST FINDINGS Cardiovascular: No significant vascular findings. Normal heart size. No pericardial effusion. Mediastinum/Nodes: Comparison with prior examinations demonstrates no mediastinal masses or adenopathy Comparison with prior examinations demonstrates no change in the right thyroid  and isthmus inhomogeneous low-attenuation density measuring 3.2 x 1.5 cm And nodular enhancing lesion of 2 x 2 cm is lateral to the left thyroid  compressing against the jugular vein which correlate with adenopathy may appear slightly larger than on prior. Lungs/Pleura: Comparison with prior examinations demonstrates no change in the fibrotic chronic changes of the left upper lobe. With ground-glass appearance of both lungs indicating chronic lung changes and interstitial lung disease Stable 3 mm subpleural nodule of the right middle lobe. Stable 3 mm fissural nodule of the right lower lobe. No other pulmonary nodules. Musculoskeletal: No chest wall mass or suspicious bone lesions identified. CT ABDOMEN PELVIS FINDINGS Hepatobiliary: Liver normal size shape and position. Comparison with prior examinations demonstrates no change in the enhancing lesion within the medial aspect of the right lobe of the liver segment 6 measuring 1.7 x 1.8 cm. No other hepatic lesions. The previously described area of decreased attenuation in segment 4B of the right lobe of the liver measures about 3 x 2 cm appears unchanged since prior examination and correlates according to prior report to focal prior early treated metastasis. No gallstones, gallbladder wall thickening, or biliary  dilatation. Pancreas: Unremarkable. No pancreatic ductal dilatation or surrounding inflammatory changes. Spleen: Normal in size without focal abnormality. Adrenals/Urinary Tract: Adrenal glands are unremarkable. Kidneys are normal, without renal calculi, focal lesion, or hydronephrosis. Bladder is unremarkable. Stomach/Bowel: Stomach is within normal limits. Appendix appears normal. No evidence of bowel wall thickening, distention, or inflammatory changes. Vascular/Lymphatic: No significant vascular findings are present. No enlarged abdominal or pelvic lymph nodes. Reproductive: Status post hysterectomy. No adnexal masses. Other: No abdominal wall hernia or abnormality. No abdominopelvic ascites. Stable bilateral inguinal adenopathy the largest on the left side measuring 1.7 x 0.9 cm Musculoskeletal: No fracture is seen. Degenerative disc disease L5-S1. IMPRESSION: *Stable fibrotic changes of the left upper lobe. *Stable 3 mm subpleural nodule of the right middle lobe. *Stable 3 mm fissural nodule of the right lower lobe. *Stable enhancing lesion within the medial aspect of the right lobe of the liver segment 6 measuring 1.7 x 1.8 cm. *Stable area of decreased attenuation in segment 4B of the right lobe of the liver which correlates according to prior report to focal prior early treated metastasis. *Stable bilateral inguinal adenopathy the largest on the left side measuring 1.7 x 0.9 cm. *Stable right thyroid  and isthmus inhomogeneous low-attenuation density measuring 3.2 x 1.5 cm. *Nodular enhancing lesion of 2 x 2 cm is lateral to the left thyroid  compressing against the jugular vein which correlate with adenopathy may appear slightly larger than on prior exam. Electronically Signed   By: Fredrich Jefferson M.D.   On: 03/05/2024 15:24     Assessment and plan- Patient is a 73 y.o. female with history of extensive stage small cell lung cancer with liver, supraclavicular lymph node and brain metastases.  She is here  for on treatment assessment prior to cycle 27 of lurbinectedin   I have reviewed CT chest abdomenAnd pelvis images independently and discussed findings with the patient and her daughter.  Overall patient has evidence of stable disease as evidenced by stable size  of left upper lobe lung mass as well as liver lesions.  She has bilateral inguinal adenopathy likely reactive which is also remained stable.  She had biopsy-proven progression in her left supraclavicular lymph node which has gradually grown in size from a prior size of 1.2 cm presently to 2 cm.  I am therefore referring her to radiation oncology for consideration of palliative radiation to this area.  Patient is relatively asymptomatic from this but the lymph node is compressing against the jugular vein and therefore radiation may be reasonable.  Counts otherwise okay to proceed with cycle 27 of lurbinectedin  today.  She does have chronic chemo induced neutropenia but her ANC remains more than 1.  She receives growth factor support with every treatment and we are doing treatment every 28 days instead of every 21 days until progression or toxicity.  Patient has a prior history of renal vein thrombosis for which she is on Eliquis  and will continue     Visit Diagnosis 1. Encounter for antineoplastic chemotherapy   2. Current use of long term anticoagulation   3. Small cell carcinoma of lung metastatic to liver [facility])      Dr. Seretha Dance, MD, MPH [facility] at [facility] [phone] 03/26/2024 9:24 AM

## 2024-03-26 NOTE — Addendum Note (Signed)
 Addended by: Marilyn Shropshire on: 03/26/2024 01:55 PM   Modules accepted: Orders

## 2024-03-26 NOTE — Progress Notes (Signed)
 Outbound call to [facility] [phone], spoke to Jerryl Morin and they will try to get it read as soon as possible. Informed patient already had their appointment and will be in infusion for approximately the next 2 hours.

## 2024-03-26 NOTE — Patient Instructions (Signed)
 [facility] - A DEPT OF [facility]  Discharge Instructions: Thank you for choosing [facility] to provide your oncology and hematology care.  If you have a lab appointment with the Cancer Center, please go directly to the Cancer Center and check in at the registration area.  Wear comfortable clothing and clothing appropriate for easy access to any Portacath or PICC line.   We strive to give you quality time with your provider. You may need to reschedule your appointment if you arrive late (15 or more minutes).  Arriving late affects you and other patients whose appointments are after yours.  Also, if you miss three or more appointments without notifying the office, you may be dismissed from the clinic at the provider's discretion.      For prescription refill requests, have your pharmacy contact our office and allow 72 hours for refills to be completed.    Today you received the following chemotherapy and/or immunotherapy agents- lurbinedectin      To help prevent nausea and vomiting after your treatment, we encourage you to take your nausea medication as directed.  BELOW ARE SYMPTOMS THAT SHOULD BE REPORTED IMMEDIATELY: *FEVER GREATER THAN 100.4 F (38 C) OR HIGHER *CHILLS OR SWEATING *NAUSEA AND VOMITING THAT IS NOT CONTROLLED WITH YOUR NAUSEA MEDICATION *UNUSUAL SHORTNESS OF BREATH *UNUSUAL BRUISING OR BLEEDING *URINARY PROBLEMS (pain or burning when urinating, or frequent urination) *BOWEL PROBLEMS (unusual diarrhea, constipation, pain near the anus) TENDERNESS IN MOUTH AND THROAT WITH OR WITHOUT PRESENCE OF ULCERS (sore throat, sores in mouth, or a toothache) UNUSUAL RASH, SWELLING OR PAIN  UNUSUAL VAGINAL DISCHARGE OR ITCHING   Items with * indicate a potential emergency and should be followed up as soon as possible or go to the Emergency Department if any problems should occur.  Please show the CHEMOTHERAPY ALERT CARD or  IMMUNOTHERAPY ALERT CARD at check-in to the Emergency Department and triage nurse.  Should you have questions after your visit or need to cancel or reschedule your appointment, please contact [facility] - A DEPT OF [facility]  [phone] and follow the prompts.  Office hours are 8:00 a.m. to 4:30 p.m. Monday - Friday. Please note that voicemails left after 4:00 p.m. may not be returned until the following business day.  We are closed weekends and major holidays. You have access to a nurse at all times for urgent questions. Please call the main number to the clinic [phone] and follow the prompts.  For any non-urgent questions, you may also contact your provider using MyChart. We now offer e-Visits for anyone 14 and older to request care online for non-urgent symptoms. For details visit [email or web address].   Also download the MyChart app! Go to the app store, search "MyChart", open the app, select [place], and log in with your MyChart username and password.

## 2024-03-26 NOTE — Patient Instructions (Signed)

## 2024-03-26 NOTE — Telephone Encounter (Signed)
 Patient left message on triage line stating she had been in clinic this morning and noticed she missed a call from Cancer Center. RN checked with team and Dr Randy Buttery had try to call patient.  RN instructed patient that Dr Randy Buttery would call her after she finished with clinic this afternoon.  Call would be after 3 pm or later.  Pt verbalized understanding.

## 2024-03-28 ENCOUNTER — Telehealth: Payer: Self-pay

## 2024-03-28 ENCOUNTER — Telehealth: Payer: Self-pay | Admitting: *Deleted

## 2024-03-28 NOTE — Telephone Encounter (Signed)
 Reached out to patient after receiving inbasket from Ms. Robin. Inbasket stated that patient said her medication was not at her preferred pharmacy but did not clarify which medication she was speaking about.   Left patient a voicemail asking to return our call with the medication she is referring to.

## 2024-03-28 NOTE — Telephone Encounter (Signed)
 The patient called about her medicine and she spelled it out to me is the dexamethasone .  She was told to start it yesterday but when she went to the pharmacist they did not have any in stock.  Today she has went and picked it up.  I told her that she needs to take 2 pills in the morning with food 2 pills same in the evening  until Dr. Randy Buttery says so .there is a review of the scan and see what is going on but that is not going to be until 5 /19 afternoon Randy Buttery tells us  what to do after that.  Patient agreeable to the plan

## 2024-03-29 ENCOUNTER — Ambulatory Visit

## 2024-03-29 ENCOUNTER — Telehealth: Payer: Self-pay | Admitting: *Deleted

## 2024-03-29 NOTE — Telephone Encounter (Signed)
 Told the patient the next thing that she has coming up is May 20 with Dr. Jacalyn Martin at 1:30.  She is okay now

## 2024-04-01 ENCOUNTER — Inpatient Hospital Stay

## 2024-04-01 ENCOUNTER — Encounter: Payer: Self-pay | Admitting: Genetic Counselor

## 2024-04-02 ENCOUNTER — Encounter: Payer: Self-pay | Admitting: Radiation Oncology

## 2024-04-02 ENCOUNTER — Ambulatory Visit
Admission: RE | Admit: 2024-04-02 | Discharge: 2024-04-02 | Disposition: A | Discharge status: Home / Self Care | Source: Ambulatory Visit | Attending: Radiation Oncology | Admitting: Radiation Oncology

## 2024-04-02 VITALS — BP 152/81 | HR 62 | Temp 98.4°F | Resp 16 | Wt 171.0 lb

## 2024-04-02 DIAGNOSIS — C787 Secondary malignant neoplasm of liver and intrahepatic bile duct: Secondary | ICD-10-CM | POA: Diagnosis not present

## 2024-04-02 DIAGNOSIS — C3490 Malignant neoplasm of unspecified part of unspecified bronchus or lung: Secondary | ICD-10-CM | POA: Insufficient documentation

## 2024-04-02 DIAGNOSIS — C7931 Secondary malignant neoplasm of brain: Secondary | ICD-10-CM | POA: Insufficient documentation

## 2024-04-02 NOTE — Progress Notes (Signed)
 Radiation Oncology Follow up Note  Name: Doris Johnson   Date:   04/02/2024 MRN:  [id number] DOB: [birth year]    This 73 y.o. female presents to the clinic today for recurrent metastatic disease to brain and patient with known extensive stage small cell lung cancer previously treated to whole brain radiation therapy in 2021 REFERRING PROVIDER: Eartha Gold, MD  HPI: Patient is a 73 year old female well-known department having received whole brain radiation therapy for extensive stage small cell lung cancer with brain involvement back in 202 3..  She had also received radiation therapy to her chest for metastatic small cell lung cancer back in 22.  She initially presented with a 6.7 cm left sided mediastinal mass causing compression of the left main pulmonary artery.  PET scan demonstrated 5.7 cm liver metastasis as well as lung findings.  Biopsy of her lymph nodes were positive for metastatic small cell lung cancer.  MRI scan initially showed 2 foci of enhancement the right cerebral hemisphere as well is right temporal lobe without mass effect.  Patient have carpal etoposide  along with Tecentriq  showing no significant improvement in the liver or lung mass.  Patient had been on maintenance Tecentriq  she was recently referred to me for persistent left supraclavicular nodal involvement with biopsy showing high-grade neuroendocrine carcinoma compatible with lung primary.  She has been receiving lurbinectedin .  Brain MRI recently was performed showing numerous new lesions throughout cerebral cerebral hemispheres and cerebellum at least 16 with previously noted lesions in the right parietal lobe and right temporal lobe and right cerebellum increasing in size.  She also has new vasogenic edema in the right parietal lobe and right cerebellum.  She is doing fairly well she is having some headaches.  No focal neurologic deficits no change in visual fields proprioception is intact.  She is accompanied by her  daughter  COMPLICATIONS OF TREATMENT: none  FOLLOW UP COMPLIANCE: keeps appointments   PHYSICAL EXAM:  BP (!) 152/81   Pulse 62   Temp 98.4 F (36.9 C) (Tympanic)   Resp 16   Wt 171 lb (77.6 kg)   BMI 27.60 kg/m  Well-developed well-nourished patient in NAD. HEENT reveals PERLA, EOMI, discs not visualized.  Oral cavity is clear. No oral mucosal lesions are identified. Neck is clear without evidence of cervical or supraclavicular adenopathy. Lungs are clear to A&P. Cardiac examination is essentially unremarkable with regular rate and rhythm without murmur rub or thrill. Abdomen is benign with no organomegaly or masses noted. Motor sensory and DTR levels are equal and symmetric in the upper and lower extremities. Cranial nerves II through XII are grossly intact. Proprioception is intact. No peripheral adenopathy or edema is identified. No motor or sensory levels are noted. Crude visual fields are within normal range.  RADIOLOGY RESULTS: MRI scan CT scans PET scans all reviewed compatible with above-stated findings  PLAN: At this time based on the nature of her disease being small cell cancer with innumerable brain metastasis I would recommend repeat whole brain radiation therapy.  I would use a more protracted course of treatment 30 Gray in 15 fractions.  I am not in favor of SRS based on the histology of her cancer as well as the innumerable brain metastasis.  SRS would be certainly equivalent to whole brain radiation therapy with a right spread brain metastasis.  I have discussed this with the patient and her daughter and have tentatively set her up for simulation early next week she does have consultation with  Dr. Mark Sil this week and would like to review his input prior to simulation.  I would like to take this opportunity to thank you for allowing me to participate in the care of your patient.Glenis Langdon, MD

## 2024-04-03 DIAGNOSIS — C3412 Malignant neoplasm of upper lobe, left bronchus or lung: Secondary | ICD-10-CM | POA: Diagnosis not present

## 2024-04-03 DIAGNOSIS — C7931 Secondary malignant neoplasm of brain: Secondary | ICD-10-CM | POA: Diagnosis not present

## 2024-04-03 DIAGNOSIS — C771 Secondary and unspecified malignant neoplasm of intrathoracic lymph nodes: Secondary | ICD-10-CM | POA: Diagnosis not present

## 2024-04-04 ENCOUNTER — Inpatient Hospital Stay (HOSPITAL_BASED_OUTPATIENT_CLINIC_OR_DEPARTMENT_OTHER): Admitting: Internal Medicine

## 2024-04-04 VITALS — BP 154/63 | HR 61 | Temp 97.3°F | Resp 13 | Wt 172.9 lb

## 2024-04-04 DIAGNOSIS — C7931 Secondary malignant neoplasm of brain: Secondary | ICD-10-CM | POA: Diagnosis not present

## 2024-04-04 DIAGNOSIS — R590 Localized enlarged lymph nodes: Secondary | ICD-10-CM | POA: Diagnosis not present

## 2024-04-04 DIAGNOSIS — C779 Secondary and unspecified malignant neoplasm of lymph node, unspecified: Secondary | ICD-10-CM | POA: Diagnosis not present

## 2024-04-04 DIAGNOSIS — Z803 Family history of malignant neoplasm of breast: Secondary | ICD-10-CM | POA: Diagnosis not present

## 2024-04-04 DIAGNOSIS — C3490 Malignant neoplasm of unspecified part of unspecified bronchus or lung: Secondary | ICD-10-CM

## 2024-04-04 DIAGNOSIS — Z5111 Encounter for antineoplastic chemotherapy: Secondary | ICD-10-CM | POA: Diagnosis not present

## 2024-04-04 DIAGNOSIS — R160 Hepatomegaly, not elsewhere classified: Secondary | ICD-10-CM | POA: Diagnosis not present

## 2024-04-04 DIAGNOSIS — E119 Type 2 diabetes mellitus without complications: Secondary | ICD-10-CM | POA: Diagnosis not present

## 2024-04-04 DIAGNOSIS — E049 Nontoxic goiter, unspecified: Secondary | ICD-10-CM | POA: Diagnosis not present

## 2024-04-04 DIAGNOSIS — C3412 Malignant neoplasm of upper lobe, left bronchus or lung: Secondary | ICD-10-CM | POA: Diagnosis not present

## 2024-04-04 DIAGNOSIS — Z8249 Family history of ischemic heart disease and other diseases of the circulatory system: Secondary | ICD-10-CM

## 2024-04-04 DIAGNOSIS — Z79899 Other long term (current) drug therapy: Secondary | ICD-10-CM

## 2024-04-04 DIAGNOSIS — C787 Secondary malignant neoplasm of liver and intrahepatic bile duct: Secondary | ICD-10-CM | POA: Diagnosis not present

## 2024-04-04 DIAGNOSIS — R5383 Other fatigue: Secondary | ICD-10-CM | POA: Diagnosis not present

## 2024-04-04 NOTE — Progress Notes (Signed)
 [facility] at [facility] [address]  [place], [place] [address] [phone]   New Patient Evaluation  Date of Service: 04/04/24 Patient Name: Doris Johnson Patient MRN: [id number] Patient DOB: 1950/12/29 Provider: Mamie Searles, MD  Identifying Statement:  Doris Johnson is a 73 y.o. female with Metastasis to brain [facility]) who presents for initial consultation and evaluation regarding cancer associated neurologic deficits.    Referring Provider: Eartha Gold, MD [address] 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 (HCC)  10/30/2020 Initial Diagnosis   Small cell lung cancer (HCC)   11/12/2020 Cancer Staging   Staging form: Lung, AJCC 8th Edition - Clinical: Stage IVB (cT4, cNX, pM1c) - Signed by Avonne Boettcher, MD on 11/12/2020   12/01/2020 - 06/28/2022 Chemotherapy   Patient is on Treatment Plan : LUNG SCLC Carboplatin  + Etoposide  + Atezolizumab  Induction q21d / Atezolizumab  Maintenance q21d     07/19/2022 - 07/19/2022 Chemotherapy   Patient is on Treatment Plan : LUNG SCLC Carboplatin  + Etoposide  + Atezolizumab  Induction q21d x 4 cycles / Atezolizumab  Maintenance q21d     08/09/2022 -  Chemotherapy   Patient is on Treatment Plan : LUNG SMALL CELL Lurbinectedin  q21d      CNS Oncologic History 02/18/22: Completes WBRT ([place])  History of Present Illness: The patient's records from the referring physician were obtained and reviewed and the patient interviewed to confirm this HPI.  Doris Johnson presents to review recent brain MRI findings.  She and her family describe some short term memory issues, fatigue.  She is still doing some driving on her own and otherwise taking care of her needs independently, living with husband.  Continues on systemic therapy with Dr. Randy Buttery, [place].  Currently dosing decadron  4mg  twice per day for  past 7 days.  Medications: Current Outpatient Medications on File Prior to Visit  Medication Sig Dispense Refill   apixaban  (ELIQUIS ) 5 MG TABS tablet Take 1 tablet (5 mg total) by mouth 2 (two) times daily. 60 tablet 1   dexamethasone  (DECADRON ) 4 MG tablet Take 1 tablet (4 mg total) by mouth 2 (two) times daily. 60 tablet 2   fluticasone  (FLONASE ) 50 MCG/ACT nasal spray Place 2 sprays into both nostrils daily. 16 g 5   furosemide  (LASIX ) 20 MG tablet Take 20 mg by mouth.     lidocaine -prilocaine  (EMLA ) cream Apply 1 Application topically as needed. Apply small amount to port site at least 1 hour prior to it being accessed, cover with plastic wrap 30 g 1   metFORMIN (GLUCOPHAGE-XR) 500 MG 24 hr tablet Take by mouth.     mirtazapine (REMERON SOL-TAB) 15 MG disintegrating tablet Take 15 mg by mouth at bedtime.     potassium chloride  SA (KLOR-CON  M) 20 MEQ tablet TAKE 1 TABLET(20 MEQ) BY MOUTH DAILY 90 tablet 2   traZODone (DESYREL) 50 MG tablet Take 50 mg by mouth at bedtime.     losartan (COZAAR) 25 MG tablet Take 1 tablet by mouth daily.     [DISCONTINUED] prochlorperazine  (COMPAZINE ) 10 MG tablet Take 1 tablet (10 mg total) by mouth every 6 (six) hours as needed (Nausea or vomiting). (Patient not taking: Reported on 10/19/2021) 30 tablet 1   Current Facility-Administered Medications on File Prior to Visit  Medication Dose Route Frequency Provider Last Rate Last Admin   sodium chloride  flush (NS) 0.9 %  injection 10 mL  10 mL Intravenous PRN Avonne Boettcher, MD   10 mL at 12/15/20 0850   sodium chloride  flush (NS) 0.9 % injection 10 mL  10 mL Intravenous PRN Rao, Archana C, MD   10 mL at 03/07/23 1610    Allergies: No Known Allergies Past Medical History:  Past Medical History:  Diagnosis Date   Cancer (HCC)    Cataract    Diabetes mellitus without complication (HCC)    Hyperlipidemia    Hypertension    Hypothyroidism    doctor's keeping an eye on thyroid     Lung cancer [facility])     Past Surgical History:  Past Surgical History:  Procedure Laterality Date   ABDOMINAL HYSTERECTOMY     IR CV LINE INJECTION  09/28/2021   MYRINGOTOMY WITH TUBE PLACEMENT Right 12/29/2022   Procedure: MYRINGOTOMY WITH TUBE PLACEMENT;  Surgeon: Juengel, Paul, MD;  Location: [facility];  Service: ENT;  Laterality: Right;  Diabetic   PORTA CATH INSERTION N/A 11/30/2020   Procedure: PORTA CATH INSERTION;  Surgeon: Celso College, MD;  Location: [facility] INVASIVE CV LAB;  Service: Cardiovascular;  Laterality: N/A;   VIDEO BRONCHOSCOPY WITH ENDOBRONCHIAL NAVIGATION N/A 10/21/2020   Procedure: VIDEO BRONCHOSCOPY WITH ENDOBRONCHIAL NAVIGATION;  Surgeon: Erskin Hearing, MD;  Location: [facility] ORS;  Service: Thoracic;  Laterality: N/A;   VIDEO BRONCHOSCOPY WITH ENDOBRONCHIAL ULTRASOUND N/A 10/21/2020   Procedure: VIDEO BRONCHOSCOPY WITH ENDOBRONCHIAL ULTRASOUND;  Surgeon: Erskin Hearing, MD;  Location: [facility] ORS;  Service: Thoracic;  Laterality: N/A;   Social History:  Social History   Socioeconomic History   Marital status: Single    Spouse name: Not on file   Number of children: Not on file   Years of education: Not on file   Highest education level: Not on file  Occupational History   Not on file  Tobacco Use   Smoking status: Never   Smokeless tobacco: Never  Vaping Use   Vaping status: Never Used  Substance and Sexual Activity   Alcohol use: No   Drug use: No   Sexual activity: Not Currently  Other Topics Concern   Not on file  Social History Narrative   Not on file   Social Drivers of Health   Financial Resource Strain: High Risk (02/08/2024)   Received from [facility]   Overall Financial Resource Strain (CARDIA)    Difficulty of Paying Living Expenses: Hard  Food Insecurity: Food Insecurity Present (02/08/2024)   [facility] from [facility]   Hunger Vital Sign    Worried About Running Out of Food in the Last Year: Sometimes true    Ran  Out of Food in the Last Year: Sometimes true  Transportation Needs: No Transportation Needs (02/08/2024)   Received from [facility] - Transportation    In the past 12 months, has lack of transportation kept you from medical appointments or from getting medications?: No    Lack of Transportation (Non-Medical): No  Physical Activity: Not on file  Stress: Not on file  Social Connections: Not on file  Intimate Partner Violence: Not on file   Family History:  Family History  Problem Relation Age of Onset   Hypertension Mother    Breast cancer Mother 38   Heart disease Father    Hyperlipidemia Brother    Heart disease Brother     Review of Systems: Constitutional: Doesn't report fevers, chills or abnormal weight loss Eyes: Doesn't report  blurriness of vision Ears, nose, mouth, throat, and face: Doesn't report sore throat Respiratory: Doesn't report cough, dyspnea or wheezes Cardiovascular: Doesn't report palpitation, chest discomfort  Gastrointestinal:  Doesn't report nausea, constipation, diarrhea GU: Doesn't report incontinence Skin: Doesn't report skin rashes Neurological: Per HPI Musculoskeletal: Doesn't report joint pain Behavioral/Psych: Doesn't report anxiety  Physical Exam: Vitals:   04/04/24 1530  BP: (!) 154/63  Pulse: 61  Resp: 13  Temp: (!) 97.3 F (36.3 C)  SpO2: 98%   KPS: 80. General: Alert, cooperative, pleasant, in no acute distress Head: Normal EENT: No conjunctival injection or scleral icterus.  Lungs: Resp effort normal Cardiac: Regular rate Abdomen: Non-distended abdomen Skin: No rashes cyanosis or petechiae. Extremities: No clubbing or edema  Neurologic Exam: Mental Status: Awake, alert, attentive to examiner. Oriented to self and environment. Language is fluent with intact comprehension.  Pyschomotor slowing, impaired recall. Cranial Nerves: Visual acuity is grossly normal. Visual fields are full. Extra-ocular  movements intact. No ptosis. Face is symmetric Motor: Tone and bulk are normal. Power is full in both arms and legs. Reflexes are symmetric, no pathologic reflexes present.  Sensory: Intact to light touch Gait: Normal.   Labs: I have reviewed the data as listed    Component Value Date/Time   NA 140 03/26/2024 0930   NA 141 03/09/2015 0000   K 3.4 (L) 03/26/2024 0930   CL 109 03/26/2024 0930   CO2 24 03/26/2024 0930   GLUCOSE 98 03/26/2024 0930   BUN 11 03/26/2024 0930   BUN 11 03/09/2015 0000   CREATININE 0.65 03/26/2024 0930   CALCIUM 9.2 03/26/2024 0930   PROT 7.3 03/26/2024 0930   ALBUMIN 3.9 03/26/2024 0930   AST 14 (L) 03/26/2024 0930   ALT 10 03/26/2024 0930   ALKPHOS 56 03/26/2024 0930   BILITOT 0.6 03/26/2024 0930   GFRNONAA >60 03/26/2024 0930   Lab Results  Component Value Date   WBC 2.0 (L) 03/26/2024   NEUTROABS 1.1 (L) 03/26/2024   HGB 11.3 (L) 03/26/2024   HCT 33.6 (L) 03/26/2024   MCV 90.8 03/26/2024   PLT 207 03/26/2024    Imaging: ADDENDUM REPORT: 04/01/2024 08:06   ADDENDUM: Case discussed at Multidisciplinary Brain Conference this morning, 04/01/2024.   After comparison with initial pretreatment MRI 01/25/2022, approximately 16 of the subcentimeter new enhancing metastases are brand new (and double arrow annotation, presentation state was updated accordingly on 03/30/2024).   But note also that multiple pre-existing enhancing metastases have increased since both the March 2024 and August 2023 MRIs, especially those which now demonstrate confluent adjacent T2/FLAIR hyperintense vasogenic edema (such as in the right parietal lobe on series 18 images 111 and 112 of this exam).     Electronically Signed   By: Marlise Simpers M.D.   On: 04/01/2024 08:06    Addended by Elwood Hammonds, MD on 04/01/2024  8:09 AM    Study Result  Narrative & Impression  CLINICAL DATA:  History of metastatic small cell lung carcinoma.   EXAM: MRI HEAD WITHOUT AND WITH  CONTRAST   TECHNIQUE: Multiplanar, multiecho pulse sequences of the brain and surrounding structures were obtained without and with intravenous contrast.   CONTRAST:  7.5mL GADAVIST  GADOBUTROL  1 MMOL/ML IV SOLN   COMPARISON:  MRI head 02/07/2023 and earlier.   FINDINGS: Brain: Multiple previously noted lesions are increased in size:   - 8 x 5 mm parasagittal right parietal lobe, previously less than 2 mm (image 111).   - 12 x 10  mm lateral right temporal lobe, previously 4 mm (image 68).   - 6 x 4 mm right cerebellum, previously 2 mm (image 53).   - 2.5 mm posterior left parietal lobe (image 92).   - Anterior inferior left frontal lobe (image 77).   Multiple new lesions compared to prior:   - 7 x 5 mm right parietal lobe lesion (image 112).   - 4 mm posterior left cerebellum (image 47).   - 4 mm superior right cerebellum (image 60).   - 5 mm anterior left cerebellum (image 53).   - Multiple new lesions in the right cerebellum measuring up to 4 mm (images 37, 43, 54, and 60).   - 2 mm superior left cerebellum (image 57).   - Inferior right temporal lobe (image 63).   - Mesial left temporal lobe (image 67).   - Right occipital lobe (images 87 and 95).   - Left frontal and parietal lobes (image 114 and 119).   - Multiple new lesions in the right parietal lobe (images 116 and 128).   - Right frontal lobe (images 121 and 124).   New vasogenic edema within the right parietal lobe extending into the posterior right frontal lobe. Additional new vasogenic edema within the right cerebellar white matter (series 11, image 17). Additionally there is increased vasogenic edema within the right temporal lobe.   Several above described lesions demonstrate associated susceptibility. There are multiple small foci of susceptibility without associated enhancement noted within the right cerebellum, left occipital lobe, and right frontoparietal lobes similar to prior.   No  acute infarct. Similar degree of periventricular white matter changes which may reflect component of post radiation changes. No midline shift. The basilar cisterns are patent. No extra-axial fluid collection.   Vascular: Normal flow voids.   Skull and upper cervical spine: Normal marrow signal.   Sinuses/Orbits: Bilateral lens replacement. Mild mucosal thickening in the ethmoid sinuses.   Other: Trace fluid in the mastoid air cells.   IMPRESSION: Compared to the 02/07/2023 there are numerous new lesions throughout the cerebral hemispheres and cerebellum as detailed above. Previously noted lesions in the right parietal lobe, right temporal lobe, and right cerebellum are increased in size.   New vasogenic edema within the right parietal lobe and right cerebellum with increased vasogenic edema in the lateral right temporal lobe. No midline shift.   Similar white matter signal which may be related to post radiation changes.   Electronically Signed: By: Denny Flack M.D. On: 03/26/2024 11:12    Assessment/Plan Metastasis to brain [facility])  Theodis Fiscal presents with clinical and radiographic syndrome c/w relapse CNS metastases 2/2 small cell carcinoma.  16 of the 19 identified lesions are new/untreated.    Case was reviewed in CNS tumor board; did recommend repeat WBRT over SRS due to lesion number and tumor histology.  They will proceed with CT/sim with Dr. Jacalyn Martin next week.    She is agreeable with proceeding with salvage WBRT, understands the risk of further cognitive effects.  We spent twenty additional minutes teaching regarding the natural history, biology, and historical experience in the treatment of neurologic complications of cancer.   We appreciate the opportunity to participate in the care of Vadis P Paro.   We ask that YANELLI ZAPANTA return to clinic in 3 months following next brain MRI, or sooner as needed.  All questions were answered. The patient  knows to call the clinic with any problems, questions or concerns. No barriers to  learning were detected.  The total time spent in the encounter was 60 minutes and more than 50% was on counseling and review of test results   Mamie Searles, MD Medical Director of Neuro-Oncology [facility] at [place] 04/04/24 3:37 PM

## 2024-04-09 ENCOUNTER — Ambulatory Visit
Admission: RE | Admit: 2024-04-09 | Discharge: 2024-04-09 | Disposition: A | Discharge status: Home / Self Care | Source: Ambulatory Visit | Attending: Radiation Oncology | Admitting: Radiation Oncology

## 2024-04-09 DIAGNOSIS — C3490 Malignant neoplasm of unspecified part of unspecified bronchus or lung: Secondary | ICD-10-CM | POA: Diagnosis not present

## 2024-04-09 DIAGNOSIS — C3412 Malignant neoplasm of upper lobe, left bronchus or lung: Secondary | ICD-10-CM | POA: Diagnosis not present

## 2024-04-09 DIAGNOSIS — C787 Secondary malignant neoplasm of liver and intrahepatic bile duct: Secondary | ICD-10-CM | POA: Diagnosis not present

## 2024-04-09 DIAGNOSIS — C7931 Secondary malignant neoplasm of brain: Secondary | ICD-10-CM | POA: Diagnosis not present

## 2024-04-10 DIAGNOSIS — C787 Secondary malignant neoplasm of liver and intrahepatic bile duct: Secondary | ICD-10-CM | POA: Diagnosis not present

## 2024-04-10 DIAGNOSIS — C3490 Malignant neoplasm of unspecified part of unspecified bronchus or lung: Secondary | ICD-10-CM | POA: Diagnosis not present

## 2024-04-10 DIAGNOSIS — C3412 Malignant neoplasm of upper lobe, left bronchus or lung: Secondary | ICD-10-CM | POA: Diagnosis not present

## 2024-04-10 DIAGNOSIS — C7931 Secondary malignant neoplasm of brain: Secondary | ICD-10-CM | POA: Diagnosis not present

## 2024-04-11 ENCOUNTER — Other Ambulatory Visit: Payer: Self-pay | Admitting: *Deleted

## 2024-04-11 DIAGNOSIS — C3490 Malignant neoplasm of unspecified part of unspecified bronchus or lung: Secondary | ICD-10-CM

## 2024-04-12 ENCOUNTER — Ambulatory Visit: Admitting: Internal Medicine

## 2024-04-15 ENCOUNTER — Ambulatory Visit
Admission: RE | Admit: 2024-04-15 | Discharge: 2024-04-15 | Disposition: A | Discharge status: Home / Self Care | Source: Ambulatory Visit | Attending: Radiation Oncology | Admitting: Radiation Oncology

## 2024-04-15 ENCOUNTER — Inpatient Hospital Stay

## 2024-04-15 DIAGNOSIS — R160 Hepatomegaly, not elsewhere classified: Secondary | ICD-10-CM | POA: Insufficient documentation

## 2024-04-15 DIAGNOSIS — R590 Localized enlarged lymph nodes: Secondary | ICD-10-CM | POA: Insufficient documentation

## 2024-04-15 DIAGNOSIS — C3490 Malignant neoplasm of unspecified part of unspecified bronchus or lung: Secondary | ICD-10-CM | POA: Insufficient documentation

## 2024-04-15 DIAGNOSIS — Z923 Personal history of irradiation: Secondary | ICD-10-CM | POA: Insufficient documentation

## 2024-04-15 DIAGNOSIS — Z8249 Family history of ischemic heart disease and other diseases of the circulatory system: Secondary | ICD-10-CM | POA: Insufficient documentation

## 2024-04-15 DIAGNOSIS — C787 Secondary malignant neoplasm of liver and intrahepatic bile duct: Secondary | ICD-10-CM | POA: Insufficient documentation

## 2024-04-15 DIAGNOSIS — E049 Nontoxic goiter, unspecified: Secondary | ICD-10-CM | POA: Insufficient documentation

## 2024-04-15 DIAGNOSIS — Z7901 Long term (current) use of anticoagulants: Secondary | ICD-10-CM | POA: Insufficient documentation

## 2024-04-15 DIAGNOSIS — Z8349 Family history of other endocrine, nutritional and metabolic diseases: Secondary | ICD-10-CM | POA: Insufficient documentation

## 2024-04-15 DIAGNOSIS — Z803 Family history of malignant neoplasm of breast: Secondary | ICD-10-CM | POA: Insufficient documentation

## 2024-04-15 DIAGNOSIS — Z79899 Other long term (current) drug therapy: Secondary | ICD-10-CM | POA: Insufficient documentation

## 2024-04-15 DIAGNOSIS — Z83438 Family history of other disorder of lipoprotein metabolism and other lipidemia: Secondary | ICD-10-CM | POA: Insufficient documentation

## 2024-04-15 DIAGNOSIS — Z87891 Personal history of nicotine dependence: Secondary | ICD-10-CM | POA: Insufficient documentation

## 2024-04-15 DIAGNOSIS — C779 Secondary and unspecified malignant neoplasm of lymph node, unspecified: Secondary | ICD-10-CM | POA: Insufficient documentation

## 2024-04-15 DIAGNOSIS — C3412 Malignant neoplasm of upper lobe, left bronchus or lung: Secondary | ICD-10-CM | POA: Insufficient documentation

## 2024-04-15 DIAGNOSIS — C7931 Secondary malignant neoplasm of brain: Secondary | ICD-10-CM | POA: Insufficient documentation

## 2024-04-15 DIAGNOSIS — E119 Type 2 diabetes mellitus without complications: Secondary | ICD-10-CM | POA: Insufficient documentation

## 2024-04-15 DIAGNOSIS — Z90710 Acquired absence of both cervix and uterus: Secondary | ICD-10-CM | POA: Insufficient documentation

## 2024-04-16 ENCOUNTER — Ambulatory Visit: Admission: RE | Admit: 2024-04-16 | Source: Ambulatory Visit

## 2024-04-16 ENCOUNTER — Ambulatory Visit

## 2024-04-16 ENCOUNTER — Inpatient Hospital Stay

## 2024-04-17 ENCOUNTER — Inpatient Hospital Stay

## 2024-04-17 ENCOUNTER — Other Ambulatory Visit: Payer: Self-pay

## 2024-04-17 ENCOUNTER — Ambulatory Visit
Admission: RE | Admit: 2024-04-17 | Discharge: 2024-04-17 | Disposition: A | Discharge status: Home / Self Care | Source: Ambulatory Visit | Attending: Radiation Oncology | Admitting: Radiation Oncology

## 2024-04-17 DIAGNOSIS — C7931 Secondary malignant neoplasm of brain: Secondary | ICD-10-CM | POA: Diagnosis not present

## 2024-04-17 DIAGNOSIS — Z510 Encounter for antineoplastic radiation therapy: Secondary | ICD-10-CM | POA: Diagnosis not present

## 2024-04-17 DIAGNOSIS — C3412 Malignant neoplasm of upper lobe, left bronchus or lung: Secondary | ICD-10-CM | POA: Diagnosis not present

## 2024-04-17 DIAGNOSIS — C3490 Malignant neoplasm of unspecified part of unspecified bronchus or lung: Secondary | ICD-10-CM | POA: Diagnosis not present

## 2024-04-17 DIAGNOSIS — C787 Secondary malignant neoplasm of liver and intrahepatic bile duct: Secondary | ICD-10-CM | POA: Diagnosis not present

## 2024-04-17 LAB — RAD ONC ARIA SESSION SUMMARY
Course Elapsed Days: 0
Plan Fractions Treated to Date: 1
Plan Prescribed Dose Per Fraction: 2 Gy
Plan Total Fractions Prescribed: 15
Plan Total Prescribed Dose: 30 Gy
Reference Point Dosage Given to Date: 2 Gy
Reference Point Session Dosage Given: 2 Gy
Session Number: 1

## 2024-04-18 ENCOUNTER — Telehealth: Payer: Self-pay | Admitting: *Deleted

## 2024-04-18 ENCOUNTER — Other Ambulatory Visit: Payer: Self-pay

## 2024-04-18 ENCOUNTER — Ambulatory Visit
Admission: RE | Admit: 2024-04-18 | Discharge: 2024-04-18 | Disposition: A | Discharge status: Home / Self Care | Source: Ambulatory Visit | Attending: Radiation Oncology | Admitting: Radiation Oncology

## 2024-04-18 DIAGNOSIS — C7931 Secondary malignant neoplasm of brain: Secondary | ICD-10-CM | POA: Diagnosis not present

## 2024-04-18 DIAGNOSIS — C787 Secondary malignant neoplasm of liver and intrahepatic bile duct: Secondary | ICD-10-CM | POA: Diagnosis not present

## 2024-04-18 DIAGNOSIS — C3490 Malignant neoplasm of unspecified part of unspecified bronchus or lung: Secondary | ICD-10-CM | POA: Diagnosis not present

## 2024-04-18 LAB — RAD ONC ARIA SESSION SUMMARY
Course Elapsed Days: 1
Plan Fractions Treated to Date: 2
Plan Prescribed Dose Per Fraction: 2 Gy
Plan Total Fractions Prescribed: 15
Plan Total Prescribed Dose: 30 Gy
Reference Point Dosage Given to Date: 4 Gy
Reference Point Session Dosage Given: 2 Gy
Session Number: 2

## 2024-04-18 NOTE — Telephone Encounter (Signed)
 The patient has been called all day today and every time that I call her back because I am already with the person on the phone no one picks up and she has no voicemail because it is already got too many messages.  She wanted to know the number to the radiation and so finally we have gotten in touch with each other and I did give her the [phone].  She says that she has been calling down there today at that number and no one picked up.  She says that she will talk to them in the morning

## 2024-04-19 ENCOUNTER — Ambulatory Visit
Admission: RE | Admit: 2024-04-19 | Discharge: 2024-04-19 | Disposition: A | Discharge status: Home / Self Care | Source: Ambulatory Visit | Attending: Radiation Oncology | Admitting: Radiation Oncology

## 2024-04-19 ENCOUNTER — Other Ambulatory Visit: Payer: Self-pay

## 2024-04-19 DIAGNOSIS — C787 Secondary malignant neoplasm of liver and intrahepatic bile duct: Secondary | ICD-10-CM | POA: Diagnosis not present

## 2024-04-19 DIAGNOSIS — C3490 Malignant neoplasm of unspecified part of unspecified bronchus or lung: Secondary | ICD-10-CM | POA: Diagnosis not present

## 2024-04-19 DIAGNOSIS — C7931 Secondary malignant neoplasm of brain: Secondary | ICD-10-CM | POA: Diagnosis not present

## 2024-04-19 LAB — RAD ONC ARIA SESSION SUMMARY
Course Elapsed Days: 2
Plan Fractions Treated to Date: 3
Plan Prescribed Dose Per Fraction: 2 Gy
Plan Total Fractions Prescribed: 15
Plan Total Prescribed Dose: 30 Gy
Reference Point Dosage Given to Date: 6 Gy
Reference Point Session Dosage Given: 2 Gy
Session Number: 3

## 2024-04-22 ENCOUNTER — Other Ambulatory Visit: Payer: Self-pay

## 2024-04-22 ENCOUNTER — Ambulatory Visit
Admission: RE | Admit: 2024-04-22 | Discharge: 2024-04-22 | Disposition: A | Discharge status: Home / Self Care | Source: Ambulatory Visit | Attending: Radiation Oncology | Admitting: Radiation Oncology

## 2024-04-22 ENCOUNTER — Inpatient Hospital Stay

## 2024-04-22 DIAGNOSIS — C787 Secondary malignant neoplasm of liver and intrahepatic bile duct: Secondary | ICD-10-CM | POA: Diagnosis not present

## 2024-04-22 DIAGNOSIS — C7931 Secondary malignant neoplasm of brain: Secondary | ICD-10-CM | POA: Diagnosis not present

## 2024-04-22 DIAGNOSIS — C3490 Malignant neoplasm of unspecified part of unspecified bronchus or lung: Secondary | ICD-10-CM | POA: Diagnosis not present

## 2024-04-22 LAB — RAD ONC ARIA SESSION SUMMARY
Course Elapsed Days: 5
Plan Fractions Treated to Date: 4
Plan Prescribed Dose Per Fraction: 2 Gy
Plan Total Fractions Prescribed: 15
Plan Total Prescribed Dose: 30 Gy
Reference Point Dosage Given to Date: 8 Gy
Reference Point Session Dosage Given: 2 Gy
Session Number: 4

## 2024-04-23 ENCOUNTER — Inpatient Hospital Stay

## 2024-04-23 ENCOUNTER — Ambulatory Visit
Admission: RE | Admit: 2024-04-23 | Discharge: 2024-04-23 | Disposition: A | Discharge status: Home / Self Care | Source: Ambulatory Visit | Attending: Radiation Oncology | Admitting: Radiation Oncology

## 2024-04-23 ENCOUNTER — Inpatient Hospital Stay (HOSPITAL_BASED_OUTPATIENT_CLINIC_OR_DEPARTMENT_OTHER): Admitting: Oncology

## 2024-04-23 ENCOUNTER — Other Ambulatory Visit: Payer: Self-pay

## 2024-04-23 ENCOUNTER — Encounter: Payer: Self-pay | Admitting: Oncology

## 2024-04-23 VITALS — BP 176/71 | HR 63 | Temp 98.3°F | Resp 16 | Wt 173.0 lb

## 2024-04-23 DIAGNOSIS — C787 Secondary malignant neoplasm of liver and intrahepatic bile duct: Secondary | ICD-10-CM

## 2024-04-23 DIAGNOSIS — C3490 Malignant neoplasm of unspecified part of unspecified bronchus or lung: Secondary | ICD-10-CM | POA: Diagnosis not present

## 2024-04-23 DIAGNOSIS — Z7189 Other specified counseling: Secondary | ICD-10-CM

## 2024-04-23 DIAGNOSIS — C7931 Secondary malignant neoplasm of brain: Secondary | ICD-10-CM

## 2024-04-23 DIAGNOSIS — Z95828 Presence of other vascular implants and grafts: Secondary | ICD-10-CM

## 2024-04-23 LAB — CBC WITH DIFFERENTIAL/PLATELET
Abs Immature Granulocytes: 0 10*3/uL (ref 0.00–0.07)
Basophils Absolute: 0 10*3/uL (ref 0.0–0.1)
Basophils Relative: 0 %
Eosinophils Absolute: 0 10*3/uL (ref 0.0–0.5)
Eosinophils Relative: 1 %
HCT: 33.9 % — ABNORMAL LOW (ref 36.0–46.0)
Hemoglobin: 11.2 g/dL — ABNORMAL LOW (ref 12.0–15.0)
Immature Granulocytes: 0 %
Lymphocytes Relative: 22 %
Lymphs Abs: 0.9 10*3/uL (ref 0.7–4.0)
MCH: 30.9 pg (ref 26.0–34.0)
MCHC: 33 g/dL (ref 30.0–36.0)
MCV: 93.4 fL (ref 80.0–100.0)
Monocytes Absolute: 0.4 10*3/uL (ref 0.1–1.0)
Monocytes Relative: 9 %
Neutro Abs: 2.9 10*3/uL (ref 1.7–7.7)
Neutrophils Relative %: 68 %
Platelets: 188 10*3/uL (ref 150–400)
RBC: 3.63 MIL/uL — ABNORMAL LOW (ref 3.87–5.11)
RDW: 15.8 % — ABNORMAL HIGH (ref 11.5–15.5)
WBC: 4.2 10*3/uL (ref 4.0–10.5)
nRBC: 0 % (ref 0.0–0.2)

## 2024-04-23 LAB — RAD ONC ARIA SESSION SUMMARY
Course Elapsed Days: 6
Plan Fractions Treated to Date: 5
Plan Prescribed Dose Per Fraction: 2 Gy
Plan Total Fractions Prescribed: 15
Plan Total Prescribed Dose: 30 Gy
Reference Point Dosage Given to Date: 10 Gy
Reference Point Session Dosage Given: 2 Gy
Session Number: 5

## 2024-04-23 LAB — COMPREHENSIVE METABOLIC PANEL WITH GFR
ALT: 11 U/L (ref 0–44)
AST: 12 U/L — ABNORMAL LOW (ref 15–41)
Albumin: 3.9 g/dL (ref 3.5–5.0)
Alkaline Phosphatase: 77 U/L (ref 38–126)
Anion gap: 7 (ref 5–15)
BUN: 15 mg/dL (ref 8–23)
CO2: 24 mmol/L (ref 22–32)
Calcium: 9.2 mg/dL (ref 8.9–10.3)
Chloride: 106 mmol/L (ref 98–111)
Creatinine, Ser: 0.58 mg/dL (ref 0.44–1.00)
GFR, Estimated: >60 mL/min (ref >60)
Glucose, Bld: 104 mg/dL — ABNORMAL HIGH (ref 70–99)
Potassium: 3.4 mmol/L — ABNORMAL LOW (ref 3.5–5.1)
Sodium: 137 mmol/L (ref 135–145)
Total Bilirubin: 0.7 mg/dL (ref 0.0–1.2)
Total Protein: 7.1 g/dL (ref 6.5–8.1)

## 2024-04-23 MED ORDER — HEPARIN SOD (PORK) LOCK FLUSH 100 UNIT/ML IV SOLN
500.0000 [IU] | Freq: Once | INTRAVENOUS | Status: AC
  Administered 2024-04-23: 500 [IU] via INTRAVENOUS
  Filled 2024-04-23: qty 5

## 2024-04-23 NOTE — Progress Notes (Signed)
 Hematology/Oncology Consult note [facility]  Telephone:([phone] [phone]  Patient Care Team: Eartha Gold, MD as PCP - General (Infectious Diseases) Drake Gens, RN as Oncology Nurse Navigator Avonne Boettcher, MD as Consulting Physician (Hematology and Oncology)   Name of the patient: Doris Johnson  [id number]  05/07/1951   Date of visit: 04/23/24  Diagnosis-  extensive stage small cell lung cancer with liver lymph node and brain metastases   Chief complaint/ Reason for visit-discuss further management of small cell lung cancer  Heme/Onc history: Patient is a 73 year old female with a remote history of smoking in her teenage years and exposure to passive smoking.  She finally had a CT chest with contrast done in October 2021 which showed a large mass in the left side of the mediastinum measuring 6.7 x 6.2 x 6.1 cm causing severe compression of the left main pulmonary artery and around the aortic arch in the left subclavian artery.  Enlarged thyroid  gland.  Left upper lobe nodule 1 x 0.7 cm.  She then underwent bronchoscopy with Dr.Aleskerov left upper lobe biopsy was nondiagnostic.  However station 4, 7 and 10 L lymph nodes were consistent with metastatic small cell carcinoma.   PET CT scan showed enlarged level 3 cervical lymph node 2.2 cm that was hypermetabolic with an SUV of 9.7.  Large central 7.3 cm AP window mass.  5.7 cm left liver mass.  MRI brain with and without contrast showed 2 small foci of enhancement in the right cerebellar hemisphere and right temporal lobe without mass-effect or edema as well concerning for brain metastases   Patient had 2 cycles of carbo etoposide  chemotherapy along with Tecentriq  and subsequent scan showed no significant improvement in the primary lung mass or liver mass.  She received radiation treatment to her primary lung mass and also seen by [facility] for second opinion.  Her pathology was reviewed at [facility]  and reported as possible neuroendocrine neoplasm But stated that small cell carcinoma cannot be excluded but not definitely diagnostic for it.  However [facility] pathology feels that this is consistent with small cell lung cancer.  She has completed 4 cycles of carbo etoposide  Tecentriq  chemotherapy and is currently on maintenance Tecentriq .  Repeat liver biopsy was also performed and was consistent with small cell lung cancer   Disease progression after 23 cycles of maintenance Tecentriq  in the left supraclavicular lymph node region.  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  Patient has been on lurbinectedin  since November 2023   Scans in May 2025 showed worsening brain mets.  There was also mild increase in theThe size of left supraclavicular lymph node from 1.2 cm to 2 cm but overall stable disease elsewhere  Interval history-patient is currently undergoing whole brain radiation and is tolerating it well.  Denies any acute issues today  ECOG PS- 1 Pain scale- 0   Review of systems- Review of Systems  Constitutional:  Negative for chills, fever, malaise/fatigue and weight loss.  HENT:  Negative for congestion, ear discharge and nosebleeds.   Eyes:  Negative for blurred vision.  Respiratory:  Negative for cough, hemoptysis, sputum production, shortness of breath and wheezing.   Cardiovascular:  Negative for chest pain, palpitations, orthopnea and claudication.  Gastrointestinal:  Negative for abdominal pain, blood in stool, constipation, diarrhea, heartburn, melena, nausea and vomiting.  Genitourinary:  Negative for dysuria, flank pain, frequency, hematuria and urgency.  Musculoskeletal:  Negative for back pain, joint pain and  myalgias.  Skin:  Negative for rash.  Neurological:  Negative for dizziness, tingling, focal weakness, seizures, weakness and headaches.  Endo/Heme/Allergies:  Does not bruise/bleed easily.  Psychiatric/Behavioral:  Negative for  depression and suicidal ideas. The patient does not have insomnia.       No Known Allergies   Past Medical History:  Diagnosis Date   Cancer (HCC)    Cataract    Diabetes mellitus without complication (HCC)    Hyperlipidemia    Hypertension    Hypothyroidism    doctor's keeping an eye on thyroid     Lung cancer [facility])      Past Surgical History:  Procedure Laterality Date   ABDOMINAL HYSTERECTOMY     IR CV LINE INJECTION  09/28/2021   MYRINGOTOMY WITH TUBE PLACEMENT Right 12/29/2022   Procedure: MYRINGOTOMY WITH TUBE PLACEMENT;  Surgeon: Juengel, Paul, MD;  Location: [facility];  Service: ENT;  Laterality: Right;  Diabetic   PORTA CATH INSERTION N/A 11/30/2020   Procedure: PORTA CATH INSERTION;  Surgeon: Celso College, MD;  Location: [facility] INVASIVE CV LAB;  Service: Cardiovascular;  Laterality: N/A;   VIDEO BRONCHOSCOPY WITH ENDOBRONCHIAL NAVIGATION N/A 10/21/2020   Procedure: VIDEO BRONCHOSCOPY WITH ENDOBRONCHIAL NAVIGATION;  Surgeon: Erskin Hearing, MD;  Location: [facility] ORS;  Service: Thoracic;  Laterality: N/A;   VIDEO BRONCHOSCOPY WITH ENDOBRONCHIAL ULTRASOUND N/A 10/21/2020   Procedure: VIDEO BRONCHOSCOPY WITH ENDOBRONCHIAL ULTRASOUND;  Surgeon: Erskin Hearing, MD;  Location: [facility] ORS;  Service: Thoracic;  Laterality: N/A;    Social History   Socioeconomic History   Marital status: Single    Spouse name: Not on file   Number of children: Not on file   Years of education: Not on file   Highest education level: Not on file  Occupational History   Not on file  Tobacco Use   Smoking status: Never   Smokeless tobacco: Never  Vaping Use   Vaping status: Never Used  Substance and Sexual Activity   Alcohol use: No   Drug use: No   Sexual activity: Not Currently  Other Topics Concern   Not on file  Social History Narrative   Not on file   Social Drivers of Health   Financial Resource Strain: High Risk (02/08/2024)   Received from [facility]    Overall Financial Resource Strain (CARDIA)    Difficulty of Paying Living Expenses: Hard  Food Insecurity: No Food Insecurity (04/04/2024)   Hunger Vital Sign    Worried About Running Out of Food in the Last Year: Never true    Ran Out of Food in the Last Year: Never true  Recent Concern: Food Insecurity - Food Insecurity Present (02/08/2024)   Received from [facility]   Hunger Vital Sign    Worried About Running Out of Food in the Last Year: Sometimes true    Ran Out of Food in the Last Year: Sometimes true  Transportation Needs: No Transportation Needs (04/04/2024)   PRAPARE - Administrator, Civil Service (Medical): No    Lack of Transportation (Non-Medical): No  Physical Activity: Not on file  Stress: Not on file  Social Connections: Not on file  Intimate Partner Violence: Not At Risk (04/04/2024)   Humiliation, Afraid, Rape, and Kick questionnaire    Fear of Current or Ex-Partner: No    Emotionally Abused: No    Physically Abused: No    Sexually Abused: No    Family History  Problem Relation Age of  Onset   Hypertension Mother    Breast cancer Mother 23   Heart disease Father    Hyperlipidemia Brother    Heart disease Brother      Current Outpatient Medications:    apixaban  (ELIQUIS ) 5 MG TABS tablet, Take 1 tablet (5 mg total) by mouth 2 (two) times daily., Disp: 60 tablet, Rfl: 1   dexamethasone  (DECADRON ) 4 MG tablet, Take 1 tablet (4 mg total) by mouth 2 (two) times daily., Disp: 60 tablet, Rfl: 2   fluticasone  (FLONASE ) 50 MCG/ACT nasal spray, Place 2 sprays into both nostrils daily., Disp: 16 g, Rfl: 5   furosemide  (LASIX ) 20 MG tablet, Take 20 mg by mouth., Disp: , Rfl:    lidocaine -prilocaine  (EMLA ) cream, Apply 1 Application topically as needed. Apply small amount to port site at least 1 hour prior to it being accessed, cover with plastic wrap, Disp: 30 g, Rfl: 1   metFORMIN (GLUCOPHAGE-XR) 500 MG 24 hr tablet, Take by mouth., Disp:  , Rfl:    mirtazapine (REMERON SOL-TAB) 15 MG disintegrating tablet, Take 15 mg by mouth at bedtime., Disp: , Rfl:    potassium chloride  SA (KLOR-CON  M) 20 MEQ tablet, TAKE 1 TABLET(20 MEQ) BY MOUTH DAILY, Disp: 90 tablet, Rfl: 2   traZODone (DESYREL) 50 MG tablet, Take 50 mg by mouth at bedtime., Disp: , Rfl:    losartan (COZAAR) 25 MG tablet, Take 1 tablet by mouth daily., Disp: , Rfl:  No current facility-administered medications for this visit.  Facility-Administered Medications Ordered in Other Visits:    sodium chloride  flush (NS) 0.9 % injection 10 mL, 10 mL, Intravenous, PRN, Avonne Boettcher, MD, 10 mL at 12/15/20 0850   sodium chloride  flush (NS) 0.9 % injection 10 mL, 10 mL, Intravenous, PRN, Avonne Boettcher, MD, 10 mL at 03/07/23 0923  Physical exam:  Vitals:   04/23/24 0853  BP: (!) 176/71  Pulse: 63  Resp: 16  Temp: 98.3 F (36.8 C)  TempSrc: Tympanic  SpO2: 100%  Weight: 173 lb (78.5 kg)   Physical Exam Cardiovascular:     Rate and Rhythm: Normal rate and regular rhythm.     Heart sounds: Normal heart sounds.  Pulmonary:     Effort: Pulmonary effort is normal.     Breath sounds: Normal breath sounds.  Skin:    General: Skin is warm and dry.  Neurological:     Mental Status: She is alert and oriented to person, place, and time.      I have personally reviewed labs listed below:    Latest Ref Rng & Units 04/23/2024    8:46 AM  CMP  Glucose 70 - 99 mg/dL 161   BUN 8 - 23 mg/dL 15   Creatinine 0.96 - 1.00 mg/dL 0.45   Sodium 409 - 811 mmol/L 137   Potassium 3.5 - 5.1 mmol/L 3.4   Chloride 98 - 111 mmol/L 106   CO2 22 - 32 mmol/L 24   Calcium 8.9 - 10.3 mg/dL 9.2   Total Protein 6.5 - 8.1 g/dL 7.1   Total Bilirubin 0.0 - 1.2 mg/dL 0.7   Alkaline Phos 38 - 126 U/L 77   AST 15 - 41 U/L 12   ALT 0 - 44 U/L 11       Latest Ref Rng & Units 04/23/2024    8:46 AM  CBC  WBC 4.0 - 10.5 K/uL 4.2   Hemoglobin 12.0 - 15.0 g/dL 91.4   Hematocrit 78.2 - 46.0 %  33.9   Platelets 150 - 400 K/uL 188      Assessment and plan- Patient is a 73 y.o. female with extensive stage small cell lung cancer with liver, lymph node and brain metastases here to discuss further management  Patient so far has received 27 cycles of lurbinectedin  so far up until May 2025.  MRI brain subsequently showed progression of brain mets and she was seen by both Dr. Mark Sil from neuro-oncology as well as Dr. Maida Sciara from radiation oncology and uniformed consensus was to proceed with second round of whole brain radiation which she will be completing in 2 weeks time.  She is not presently on steroids as per recommendations from Dr. Mark Sil.  Will plan to repeat MRI brain in about 2 months time.  With regards to her small cell lung cancer outside the brain the only area of progression was the left supraclavicular lymph node which had increased from 1.2 cm to 2 cm with stable disease elsewhere.  I have personally spoken to Dr. Jacalyn Martin about this and he will be offering 5 days of SBRT.  I will tentatively plan to see her 4 weeks from now after she has done with all radiation to resume systemic treatment with lurbinectedin .  If she has further systemic progression on this regimen I will refer her to [facility] for consideration of tarlatamab   Visit Diagnosis 1. Small cell carcinoma of lung metastatic to liver (HCC)   2. Goals of care, counseling/discussion   3. Lung cancer metastatic to brain [facility])      Dr. Seretha Dance, MD, MPH [facility] at [facility] [phone] 04/23/2024 10:05 AM

## 2024-04-24 ENCOUNTER — Inpatient Hospital Stay

## 2024-04-24 ENCOUNTER — Ambulatory Visit
Admission: RE | Admit: 2024-04-24 | Discharge: 2024-04-24 | Disposition: A | Discharge status: Home / Self Care | Source: Ambulatory Visit | Attending: Radiation Oncology | Admitting: Radiation Oncology

## 2024-04-24 ENCOUNTER — Other Ambulatory Visit: Payer: Self-pay

## 2024-04-24 DIAGNOSIS — C3412 Malignant neoplasm of upper lobe, left bronchus or lung: Secondary | ICD-10-CM | POA: Diagnosis not present

## 2024-04-24 DIAGNOSIS — C7931 Secondary malignant neoplasm of brain: Secondary | ICD-10-CM | POA: Diagnosis not present

## 2024-04-24 DIAGNOSIS — C787 Secondary malignant neoplasm of liver and intrahepatic bile duct: Secondary | ICD-10-CM | POA: Diagnosis not present

## 2024-04-24 DIAGNOSIS — Z510 Encounter for antineoplastic radiation therapy: Secondary | ICD-10-CM | POA: Diagnosis not present

## 2024-04-24 DIAGNOSIS — C3490 Malignant neoplasm of unspecified part of unspecified bronchus or lung: Secondary | ICD-10-CM | POA: Diagnosis not present

## 2024-04-24 LAB — RAD ONC ARIA SESSION SUMMARY
Course Elapsed Days: 7
Plan Fractions Treated to Date: 6
Plan Prescribed Dose Per Fraction: 2 Gy
Plan Total Fractions Prescribed: 15
Plan Total Prescribed Dose: 30 Gy
Reference Point Dosage Given to Date: 12 Gy
Reference Point Session Dosage Given: 2 Gy
Session Number: 6

## 2024-04-25 ENCOUNTER — Ambulatory Visit
Admission: RE | Admit: 2024-04-25 | Discharge: 2024-04-25 | Disposition: A | Discharge status: Home / Self Care | Source: Ambulatory Visit | Attending: Radiation Oncology | Admitting: Radiation Oncology

## 2024-04-25 ENCOUNTER — Inpatient Hospital Stay

## 2024-04-25 ENCOUNTER — Other Ambulatory Visit: Payer: Self-pay

## 2024-04-25 ENCOUNTER — Telehealth: Payer: Self-pay | Admitting: *Deleted

## 2024-04-25 DIAGNOSIS — C7931 Secondary malignant neoplasm of brain: Secondary | ICD-10-CM | POA: Diagnosis not present

## 2024-04-25 DIAGNOSIS — C787 Secondary malignant neoplasm of liver and intrahepatic bile duct: Secondary | ICD-10-CM | POA: Diagnosis not present

## 2024-04-25 DIAGNOSIS — C3490 Malignant neoplasm of unspecified part of unspecified bronchus or lung: Secondary | ICD-10-CM | POA: Diagnosis not present

## 2024-04-25 LAB — RAD ONC ARIA SESSION SUMMARY
Course Elapsed Days: 8
Plan Fractions Treated to Date: 7
Plan Prescribed Dose Per Fraction: 2 Gy
Plan Total Fractions Prescribed: 15
Plan Total Prescribed Dose: 30 Gy
Reference Point Dosage Given to Date: 14 Gy
Reference Point Session Dosage Given: 2 Gy
Session Number: 7

## 2024-04-25 NOTE — Telephone Encounter (Signed)
 Patient called and said that she has a ride and she wanted to make sure that they do not come and get her today from radiation because she has got a ride.  I called Irena Manners and she said Burdette Carolin already told her that the patient has a ride today.

## 2024-04-26 ENCOUNTER — Ambulatory Visit
Admission: RE | Admit: 2024-04-26 | Discharge: 2024-04-26 | Disposition: A | Discharge status: Home / Self Care | Source: Ambulatory Visit | Attending: Radiation Oncology | Admitting: Radiation Oncology

## 2024-04-26 ENCOUNTER — Other Ambulatory Visit: Payer: Self-pay

## 2024-04-26 ENCOUNTER — Inpatient Hospital Stay

## 2024-04-26 DIAGNOSIS — C7931 Secondary malignant neoplasm of brain: Secondary | ICD-10-CM | POA: Diagnosis not present

## 2024-04-26 DIAGNOSIS — C787 Secondary malignant neoplasm of liver and intrahepatic bile duct: Secondary | ICD-10-CM | POA: Diagnosis not present

## 2024-04-26 DIAGNOSIS — C3490 Malignant neoplasm of unspecified part of unspecified bronchus or lung: Secondary | ICD-10-CM | POA: Diagnosis not present

## 2024-04-26 LAB — RAD ONC ARIA SESSION SUMMARY
Course Elapsed Days: 9
Plan Fractions Treated to Date: 8
Plan Prescribed Dose Per Fraction: 2 Gy
Plan Total Fractions Prescribed: 15
Plan Total Prescribed Dose: 30 Gy
Reference Point Dosage Given to Date: 16 Gy
Reference Point Session Dosage Given: 2 Gy
Session Number: 8

## 2024-04-29 ENCOUNTER — Ambulatory Visit
Admission: RE | Admit: 2024-04-29 | Discharge: 2024-04-29 | Disposition: A | Discharge status: Home / Self Care | Source: Ambulatory Visit | Attending: Radiation Oncology | Admitting: Radiation Oncology

## 2024-04-29 ENCOUNTER — Inpatient Hospital Stay

## 2024-04-29 ENCOUNTER — Other Ambulatory Visit: Payer: Self-pay

## 2024-04-29 DIAGNOSIS — C7931 Secondary malignant neoplasm of brain: Secondary | ICD-10-CM | POA: Diagnosis not present

## 2024-04-29 DIAGNOSIS — C3490 Malignant neoplasm of unspecified part of unspecified bronchus or lung: Secondary | ICD-10-CM

## 2024-04-29 DIAGNOSIS — C787 Secondary malignant neoplasm of liver and intrahepatic bile duct: Secondary | ICD-10-CM | POA: Diagnosis not present

## 2024-04-29 LAB — CBC (CANCER CENTER ONLY)
HCT: 38.6 % (ref 36.0–46.0)
Hemoglobin: 12.5 g/dL (ref 12.0–15.0)
MCH: 31.2 pg (ref 26.0–34.0)
MCHC: 32.4 g/dL (ref 30.0–36.0)
MCV: 96.3 fL (ref 80.0–100.0)
Platelet Count: 234 10*3/uL (ref 150–400)
RBC: 4.01 MIL/uL (ref 3.87–5.11)
RDW: 15.9 % — ABNORMAL HIGH (ref 11.5–15.5)
WBC Count: 3.7 10*3/uL — ABNORMAL LOW (ref 4.0–10.5)
nRBC: 0 % (ref 0.0–0.2)

## 2024-04-29 LAB — RAD ONC ARIA SESSION SUMMARY
Course Elapsed Days: 12
Plan Fractions Treated to Date: 9
Plan Prescribed Dose Per Fraction: 2 Gy
Plan Total Fractions Prescribed: 15
Plan Total Prescribed Dose: 30 Gy
Reference Point Dosage Given to Date: 18 Gy
Reference Point Session Dosage Given: 2 Gy
Session Number: 9

## 2024-04-30 ENCOUNTER — Other Ambulatory Visit: Payer: Self-pay

## 2024-04-30 ENCOUNTER — Ambulatory Visit
Admission: RE | Admit: 2024-04-30 | Discharge: 2024-04-30 | Disposition: A | Discharge status: Home / Self Care | Source: Ambulatory Visit | Attending: Radiation Oncology | Admitting: Radiation Oncology

## 2024-04-30 DIAGNOSIS — C787 Secondary malignant neoplasm of liver and intrahepatic bile duct: Secondary | ICD-10-CM | POA: Diagnosis not present

## 2024-04-30 DIAGNOSIS — C771 Secondary and unspecified malignant neoplasm of intrathoracic lymph nodes: Secondary | ICD-10-CM | POA: Diagnosis not present

## 2024-04-30 DIAGNOSIS — C3412 Malignant neoplasm of upper lobe, left bronchus or lung: Secondary | ICD-10-CM | POA: Diagnosis not present

## 2024-04-30 DIAGNOSIS — C3490 Malignant neoplasm of unspecified part of unspecified bronchus or lung: Secondary | ICD-10-CM | POA: Diagnosis not present

## 2024-04-30 DIAGNOSIS — C7931 Secondary malignant neoplasm of brain: Secondary | ICD-10-CM | POA: Diagnosis not present

## 2024-04-30 LAB — RAD ONC ARIA SESSION SUMMARY
Course Elapsed Days: 13
Plan Fractions Treated to Date: 10
Plan Prescribed Dose Per Fraction: 2 Gy
Plan Total Fractions Prescribed: 15
Plan Total Prescribed Dose: 30 Gy
Reference Point Dosage Given to Date: 20 Gy
Reference Point Session Dosage Given: 2 Gy
Session Number: 10

## 2024-05-01 ENCOUNTER — Other Ambulatory Visit: Payer: Self-pay

## 2024-05-01 ENCOUNTER — Ambulatory Visit
Admission: RE | Admit: 2024-05-01 | Discharge: 2024-05-01 | Disposition: A | Discharge status: Home / Self Care | Source: Ambulatory Visit | Attending: Radiation Oncology | Admitting: Radiation Oncology

## 2024-05-01 DIAGNOSIS — Z510 Encounter for antineoplastic radiation therapy: Secondary | ICD-10-CM | POA: Diagnosis not present

## 2024-05-01 DIAGNOSIS — C3412 Malignant neoplasm of upper lobe, left bronchus or lung: Secondary | ICD-10-CM | POA: Diagnosis not present

## 2024-05-01 DIAGNOSIS — C3490 Malignant neoplasm of unspecified part of unspecified bronchus or lung: Secondary | ICD-10-CM | POA: Diagnosis not present

## 2024-05-01 DIAGNOSIS — C787 Secondary malignant neoplasm of liver and intrahepatic bile duct: Secondary | ICD-10-CM | POA: Diagnosis not present

## 2024-05-01 DIAGNOSIS — C7931 Secondary malignant neoplasm of brain: Secondary | ICD-10-CM | POA: Diagnosis not present

## 2024-05-01 LAB — RAD ONC ARIA SESSION SUMMARY
Course Elapsed Days: 14
Plan Fractions Treated to Date: 11
Plan Prescribed Dose Per Fraction: 2 Gy
Plan Total Fractions Prescribed: 15
Plan Total Prescribed Dose: 30 Gy
Reference Point Dosage Given to Date: 22 Gy
Reference Point Session Dosage Given: 2 Gy
Session Number: 11

## 2024-05-02 ENCOUNTER — Other Ambulatory Visit: Payer: Self-pay

## 2024-05-02 ENCOUNTER — Ambulatory Visit
Admission: RE | Admit: 2024-05-02 | Discharge: 2024-05-02 | Disposition: A | Discharge status: Home / Self Care | Source: Ambulatory Visit | Attending: Radiation Oncology | Admitting: Radiation Oncology

## 2024-05-02 DIAGNOSIS — C787 Secondary malignant neoplasm of liver and intrahepatic bile duct: Secondary | ICD-10-CM | POA: Diagnosis not present

## 2024-05-02 DIAGNOSIS — C3490 Malignant neoplasm of unspecified part of unspecified bronchus or lung: Secondary | ICD-10-CM | POA: Diagnosis not present

## 2024-05-02 DIAGNOSIS — C7931 Secondary malignant neoplasm of brain: Secondary | ICD-10-CM | POA: Diagnosis not present

## 2024-05-02 LAB — RAD ONC ARIA SESSION SUMMARY
Course Elapsed Days: 15
Plan Fractions Treated to Date: 12
Plan Prescribed Dose Per Fraction: 2 Gy
Plan Total Fractions Prescribed: 15
Plan Total Prescribed Dose: 30 Gy
Reference Point Dosage Given to Date: 24 Gy
Reference Point Session Dosage Given: 2 Gy
Session Number: 12

## 2024-05-03 ENCOUNTER — Other Ambulatory Visit: Payer: Self-pay

## 2024-05-03 ENCOUNTER — Ambulatory Visit
Admission: RE | Admit: 2024-05-03 | Discharge: 2024-05-03 | Disposition: A | Discharge status: Home / Self Care | Source: Ambulatory Visit | Attending: Radiation Oncology | Admitting: Radiation Oncology

## 2024-05-03 DIAGNOSIS — C3490 Malignant neoplasm of unspecified part of unspecified bronchus or lung: Secondary | ICD-10-CM | POA: Diagnosis not present

## 2024-05-03 DIAGNOSIS — C7931 Secondary malignant neoplasm of brain: Secondary | ICD-10-CM | POA: Diagnosis not present

## 2024-05-03 DIAGNOSIS — C787 Secondary malignant neoplasm of liver and intrahepatic bile duct: Secondary | ICD-10-CM | POA: Diagnosis not present

## 2024-05-03 LAB — RAD ONC ARIA SESSION SUMMARY
Course Elapsed Days: 16
Plan Fractions Treated to Date: 13
Plan Prescribed Dose Per Fraction: 2 Gy
Plan Total Fractions Prescribed: 15
Plan Total Prescribed Dose: 30 Gy
Reference Point Dosage Given to Date: 26 Gy
Reference Point Session Dosage Given: 2 Gy
Session Number: 13

## 2024-05-06 ENCOUNTER — Ambulatory Visit
Admission: RE | Admit: 2024-05-06 | Discharge: 2024-05-06 | Disposition: A | Discharge status: Home / Self Care | Source: Ambulatory Visit | Attending: Radiation Oncology | Admitting: Radiation Oncology

## 2024-05-06 ENCOUNTER — Ambulatory Visit

## 2024-05-06 ENCOUNTER — Other Ambulatory Visit: Payer: Self-pay

## 2024-05-06 DIAGNOSIS — C787 Secondary malignant neoplasm of liver and intrahepatic bile duct: Secondary | ICD-10-CM | POA: Diagnosis not present

## 2024-05-06 DIAGNOSIS — C7931 Secondary malignant neoplasm of brain: Secondary | ICD-10-CM | POA: Diagnosis not present

## 2024-05-06 DIAGNOSIS — C3490 Malignant neoplasm of unspecified part of unspecified bronchus or lung: Secondary | ICD-10-CM | POA: Diagnosis not present

## 2024-05-06 LAB — RAD ONC ARIA SESSION SUMMARY
Course Elapsed Days: 19
Plan Fractions Treated to Date: 14
Plan Prescribed Dose Per Fraction: 2 Gy
Plan Total Fractions Prescribed: 15
Plan Total Prescribed Dose: 30 Gy
Reference Point Dosage Given to Date: 28 Gy
Reference Point Session Dosage Given: 2 Gy
Session Number: 14

## 2024-05-07 ENCOUNTER — Other Ambulatory Visit: Payer: Self-pay

## 2024-05-07 ENCOUNTER — Ambulatory Visit
Admission: RE | Admit: 2024-05-07 | Discharge: 2024-05-07 | Disposition: A | Discharge status: Home / Self Care | Source: Ambulatory Visit | Attending: Radiation Oncology | Admitting: Radiation Oncology

## 2024-05-07 DIAGNOSIS — C787 Secondary malignant neoplasm of liver and intrahepatic bile duct: Secondary | ICD-10-CM | POA: Diagnosis not present

## 2024-05-07 DIAGNOSIS — C7931 Secondary malignant neoplasm of brain: Secondary | ICD-10-CM | POA: Diagnosis not present

## 2024-05-07 DIAGNOSIS — C3490 Malignant neoplasm of unspecified part of unspecified bronchus or lung: Secondary | ICD-10-CM | POA: Diagnosis not present

## 2024-05-07 LAB — RAD ONC ARIA SESSION SUMMARY
Course Elapsed Days: 20
Plan Fractions Treated to Date: 15
Plan Prescribed Dose Per Fraction: 2 Gy
Plan Total Fractions Prescribed: 15
Plan Total Prescribed Dose: 30 Gy
Reference Point Dosage Given to Date: 30 Gy
Reference Point Session Dosage Given: 2 Gy
Session Number: 15

## 2024-05-08 DIAGNOSIS — C787 Secondary malignant neoplasm of liver and intrahepatic bile duct: Secondary | ICD-10-CM | POA: Diagnosis not present

## 2024-05-08 DIAGNOSIS — C7931 Secondary malignant neoplasm of brain: Secondary | ICD-10-CM | POA: Diagnosis not present

## 2024-05-08 DIAGNOSIS — C3412 Malignant neoplasm of upper lobe, left bronchus or lung: Secondary | ICD-10-CM | POA: Diagnosis not present

## 2024-05-08 DIAGNOSIS — C3490 Malignant neoplasm of unspecified part of unspecified bronchus or lung: Secondary | ICD-10-CM | POA: Diagnosis not present

## 2024-05-08 DIAGNOSIS — C771 Secondary and unspecified malignant neoplasm of intrathoracic lymph nodes: Secondary | ICD-10-CM | POA: Diagnosis not present

## 2024-05-08 NOTE — Radiation Completion Notes (Signed)
 Patient Name: Doris Johnson, Doris Johnson MRN: [id number] Date of Birth: April 16, 1951 Referring Physician: ALM NEEDLE, M.D. Date of Service: 2024-05-08 Radiation Oncologist: Marcey Penton, M.D. [facility]                             RADIATION ONCOLOGY END OF TREATMENT NOTE     Diagnosis: C79.31 Secondary malignant neoplasm of brain; C77.1 Secondary and unspecified malignant neoplasm of intrathoracic lymph nodes Staging on 2020-11-12: Small cell carcinoma of lung metastatic to liver (HCC) T=cT4, N=cNX, M=pM1c Intent: Curative     HPI: Patient is a 73 year old female well-known department having received whole brain radiation therapy for extensive stage small cell lung cancer with brain involvement back in 202 3..  She had also received radiation therapy to her chest for metastatic small cell lung cancer back in 22.  She initially presented with a 6.7 cm left sided mediastinal mass causing compression of the left main pulmonary artery.  PET scan demonstrated 5.7 cm liver metastasis as well as lung findings.  Biopsy of her lymph nodes were positive for metastatic small cell lung cancer.  MRI scan initially showed 2 foci of enhancement the right cerebral hemisphere as well is right temporal lobe without mass effect.  Patient have carpal etoposide  along with Tecentriq  showing no significant improvement in the liver or lung mass.  Patient had been on maintenance Tecentriq  she was recently referred to me for persistent left supraclavicular nodal involvement with biopsy showing high-grade neuroendocrine carcinoma compatible with lung primary.  She has been receiving lurbinectedin .  Brain MRI recently was performed showing numerous new lesions throughout cerebral cerebral hemispheres and cerebellum at least 16 with previously noted lesions in the right parietal lobe and right temporal lobe and right cerebellum increasing in size.  She also has new vasogenic edema in the right parietal lobe  and right cerebellum.  She is doing fairly well she is having some headaches.  No focal neurologic deficits no change in visual fields proprioception is intact.  She is accompanied by her daughter      ==========DELIVERED PLANS==========  First Treatment Date: 2024-04-17 Last Treatment Date: 2024-05-07   Plan Name: Brain_Whole Site: Brain Technique: Isodose Plan Mode: Photon Dose Per Fraction: 2 Gy Prescribed Dose (Delivered / Prescribed): 30 Gy / 30 Gy Prescribed Fxs (Delivered / Prescribed): 15 / 15     ==========ON TREATMENT VISIT DATES========== 2024-04-23, 2024-04-30, 2024-05-07     ==========UPCOMING VISITS========== 06/28/2024 [facility] ON[facility] EST PT Buckley Arthea POUR, MD  06/18/2024 [facility] ON[facility] INFUSION [facility]- MO INFUSION CHAIR 2  06/18/2024 [facility] EST PT Melanee Annah BROCKS, MD  06/18/2024 [facility] ON[facility] INF PORT FLUSH W/LAB [facility]-PORT FLUSH  06/06/2024 [facility] RAD ONCOLOGY FOLLOW UP 30 Chrystal, Marcey, MD  05/29/2024 [facility] ONCOLOGY SBRT TREATMENT [id number]  05/27/2024 [facility] ONCOLOG[facility] SBRT TREATMENT [id number]  05/22/2024 [facility] ONCOL[facility] SBRT TREATMENT [id number]  05/21/2024 [facility] INFUSION [facility]- MO INFUSION CHAIR 6  05/21/2024 [facility] ON[facility] EST PT Melanee Annah BROCKS, MD  05/21/2024 [facility] ON[facility] INF PORT FLUSH W/LAB [facility]-PORT FLUSH  05/20/2024 [facility] ONCOLOGY SBRT TREATMENT [id number]  05/16/2024 [facility] ONCOLOG[facility] SBRT TREATMENT [id number]        ==========APPENDIX - ON TREATMENT VISIT NOTES==========   See weekly On Treatment Notes in Epic for details in the Media tab (listed as Progress notes on the On Treatment Visit Dates listed above).

## 2024-05-16 ENCOUNTER — Ambulatory Visit
Admission: RE | Admit: 2024-05-16 | Discharge: 2024-05-16 | Disposition: A | Discharge status: Home / Self Care | Source: Ambulatory Visit | Attending: Radiation Oncology | Admitting: Radiation Oncology

## 2024-05-16 ENCOUNTER — Encounter: Payer: Self-pay | Admitting: Oncology

## 2024-05-16 ENCOUNTER — Other Ambulatory Visit: Payer: Self-pay

## 2024-05-16 DIAGNOSIS — C3490 Malignant neoplasm of unspecified part of unspecified bronchus or lung: Secondary | ICD-10-CM | POA: Diagnosis not present

## 2024-05-16 DIAGNOSIS — C7931 Secondary malignant neoplasm of brain: Secondary | ICD-10-CM | POA: Insufficient documentation

## 2024-05-16 DIAGNOSIS — Z510 Encounter for antineoplastic radiation therapy: Secondary | ICD-10-CM | POA: Diagnosis not present

## 2024-05-16 DIAGNOSIS — C771 Secondary and unspecified malignant neoplasm of intrathoracic lymph nodes: Secondary | ICD-10-CM | POA: Diagnosis not present

## 2024-05-16 DIAGNOSIS — C787 Secondary malignant neoplasm of liver and intrahepatic bile duct: Secondary | ICD-10-CM | POA: Diagnosis not present

## 2024-05-16 DIAGNOSIS — C3412 Malignant neoplasm of upper lobe, left bronchus or lung: Secondary | ICD-10-CM | POA: Diagnosis not present

## 2024-05-16 LAB — RAD ONC ARIA SESSION SUMMARY
Course Elapsed Days: 29
Plan Fractions Treated to Date: 1
Plan Prescribed Dose Per Fraction: 10 Gy
Plan Total Fractions Prescribed: 5
Plan Total Prescribed Dose: 50 Gy
Reference Point Dosage Given to Date: 10 Gy
Reference Point Session Dosage Given: 10 Gy
Session Number: 16

## 2024-05-20 ENCOUNTER — Ambulatory Visit
Admission: RE | Admit: 2024-05-20 | Discharge: 2024-05-20 | Disposition: A | Discharge status: Home / Self Care | Source: Ambulatory Visit | Attending: Radiation Oncology | Admitting: Radiation Oncology

## 2024-05-20 ENCOUNTER — Other Ambulatory Visit: Payer: Self-pay

## 2024-05-20 DIAGNOSIS — C787 Secondary malignant neoplasm of liver and intrahepatic bile duct: Secondary | ICD-10-CM | POA: Diagnosis not present

## 2024-05-20 DIAGNOSIS — C3490 Malignant neoplasm of unspecified part of unspecified bronchus or lung: Secondary | ICD-10-CM | POA: Diagnosis not present

## 2024-05-20 DIAGNOSIS — C7931 Secondary malignant neoplasm of brain: Secondary | ICD-10-CM | POA: Diagnosis not present

## 2024-05-20 LAB — RAD ONC ARIA SESSION SUMMARY
Course Elapsed Days: 33
Plan Fractions Treated to Date: 2
Plan Prescribed Dose Per Fraction: 10 Gy
Plan Total Fractions Prescribed: 5
Plan Total Prescribed Dose: 50 Gy
Reference Point Dosage Given to Date: 20 Gy
Reference Point Session Dosage Given: 10 Gy
Session Number: 17

## 2024-05-21 ENCOUNTER — Encounter: Payer: Self-pay | Admitting: Oncology

## 2024-05-21 ENCOUNTER — Inpatient Hospital Stay (HOSPITAL_BASED_OUTPATIENT_CLINIC_OR_DEPARTMENT_OTHER): Admitting: Oncology

## 2024-05-21 ENCOUNTER — Inpatient Hospital Stay

## 2024-05-21 ENCOUNTER — Inpatient Hospital Stay: Attending: Oncology

## 2024-05-21 VITALS — BP 135/74 | HR 56 | Temp 98.4°F | Resp 19 | Ht 66.0 in | Wt 172.0 lb

## 2024-05-21 DIAGNOSIS — Z7901 Long term (current) use of anticoagulants: Secondary | ICD-10-CM | POA: Diagnosis not present

## 2024-05-21 DIAGNOSIS — E049 Nontoxic goiter, unspecified: Secondary | ICD-10-CM | POA: Diagnosis not present

## 2024-05-21 DIAGNOSIS — Z5111 Encounter for antineoplastic chemotherapy: Secondary | ICD-10-CM | POA: Diagnosis not present

## 2024-05-21 DIAGNOSIS — Z5189 Encounter for other specified aftercare: Secondary | ICD-10-CM | POA: Diagnosis not present

## 2024-05-21 DIAGNOSIS — Z83438 Family history of other disorder of lipoprotein metabolism and other lipidemia: Secondary | ICD-10-CM | POA: Insufficient documentation

## 2024-05-21 DIAGNOSIS — T451X5A Adverse effect of antineoplastic and immunosuppressive drugs, initial encounter: Secondary | ICD-10-CM

## 2024-05-21 DIAGNOSIS — C3490 Malignant neoplasm of unspecified part of unspecified bronchus or lung: Secondary | ICD-10-CM

## 2024-05-21 DIAGNOSIS — C787 Secondary malignant neoplasm of liver and intrahepatic bile duct: Secondary | ICD-10-CM | POA: Diagnosis not present

## 2024-05-21 DIAGNOSIS — D701 Agranulocytosis secondary to cancer chemotherapy: Secondary | ICD-10-CM

## 2024-05-21 DIAGNOSIS — Z87891 Personal history of nicotine dependence: Secondary | ICD-10-CM | POA: Insufficient documentation

## 2024-05-21 DIAGNOSIS — Z90710 Acquired absence of both cervix and uterus: Secondary | ICD-10-CM | POA: Diagnosis not present

## 2024-05-21 DIAGNOSIS — Z5941 Food insecurity: Secondary | ICD-10-CM | POA: Diagnosis not present

## 2024-05-21 DIAGNOSIS — C3412 Malignant neoplasm of upper lobe, left bronchus or lung: Secondary | ICD-10-CM | POA: Insufficient documentation

## 2024-05-21 DIAGNOSIS — Z803 Family history of malignant neoplasm of breast: Secondary | ICD-10-CM | POA: Diagnosis not present

## 2024-05-21 DIAGNOSIS — Z8249 Family history of ischemic heart disease and other diseases of the circulatory system: Secondary | ICD-10-CM | POA: Insufficient documentation

## 2024-05-21 DIAGNOSIS — Z5986 Financial insecurity: Secondary | ICD-10-CM | POA: Diagnosis not present

## 2024-05-21 DIAGNOSIS — C770 Secondary and unspecified malignant neoplasm of lymph nodes of head, face and neck: Secondary | ICD-10-CM | POA: Diagnosis not present

## 2024-05-21 DIAGNOSIS — C7931 Secondary malignant neoplasm of brain: Secondary | ICD-10-CM | POA: Diagnosis not present

## 2024-05-21 DIAGNOSIS — Z79899 Other long term (current) drug therapy: Secondary | ICD-10-CM | POA: Diagnosis not present

## 2024-05-21 LAB — CBC WITH DIFFERENTIAL/PLATELET
Abs Immature Granulocytes: 0 K/uL (ref 0.00–0.07)
Basophils Absolute: 0 K/uL (ref 0.0–0.1)
Basophils Relative: 0 %
Eosinophils Absolute: 0 K/uL (ref 0.0–0.5)
Eosinophils Relative: 2 %
HCT: 36.4 % (ref 36.0–46.0)
Hemoglobin: 12.2 g/dL (ref 12.0–15.0)
Immature Granulocytes: 0 %
Lymphocytes Relative: 22 %
Lymphs Abs: 0.6 K/uL — ABNORMAL LOW (ref 0.7–4.0)
MCH: 30.8 pg (ref 26.0–34.0)
MCHC: 33.5 g/dL (ref 30.0–36.0)
MCV: 91.9 fL (ref 80.0–100.0)
Monocytes Absolute: 0.3 K/uL (ref 0.1–1.0)
Monocytes Relative: 12 %
Neutro Abs: 1.7 K/uL (ref 1.7–7.7)
Neutrophils Relative %: 64 %
Platelets: 212 K/uL (ref 150–400)
RBC: 3.96 MIL/uL (ref 3.87–5.11)
RDW: 13.2 % (ref 11.5–15.5)
WBC: 2.7 K/uL — ABNORMAL LOW (ref 4.0–10.5)
nRBC: 0 % (ref 0.0–0.2)

## 2024-05-21 LAB — COMPREHENSIVE METABOLIC PANEL WITH GFR
ALT: 13 U/L (ref 0–44)
AST: 14 U/L — ABNORMAL LOW (ref 15–41)
Albumin: 3.8 g/dL (ref 3.5–5.0)
Alkaline Phosphatase: 43 U/L (ref 38–126)
Anion gap: 7 (ref 5–15)
BUN: 12 mg/dL (ref 8–23)
CO2: 24 mmol/L (ref 22–32)
Calcium: 9.3 mg/dL (ref 8.9–10.3)
Chloride: 108 mmol/L (ref 98–111)
Creatinine, Ser: 0.59 mg/dL (ref 0.44–1.00)
GFR, Estimated: >60 mL/min (ref >60)
Glucose, Bld: 97 mg/dL (ref 70–99)
Potassium: 3.2 mmol/L — ABNORMAL LOW (ref 3.5–5.1)
Sodium: 139 mmol/L (ref 135–145)
Total Bilirubin: 0.8 mg/dL (ref 0.0–1.2)
Total Protein: 7 g/dL (ref 6.5–8.1)

## 2024-05-21 MED ORDER — PEGFILGRASTIM 6 MG/0.6ML ~~LOC~~ PSKT
6.0000 mg | PREFILLED_SYRINGE | Freq: Once | SUBCUTANEOUS | Status: AC
  Administered 2024-05-21: 6 mg via SUBCUTANEOUS
  Filled 2024-05-21: qty 0.6

## 2024-05-21 MED ORDER — DEXAMETHASONE SODIUM PHOSPHATE 10 MG/ML IJ SOLN
10.0000 mg | Freq: Once | INTRAMUSCULAR | Status: AC
  Administered 2024-05-21: 10 mg via INTRAVENOUS
  Filled 2024-05-21: qty 1

## 2024-05-21 MED ORDER — PALONOSETRON HCL INJECTION 0.25 MG/5ML
0.2500 mg | Freq: Once | INTRAVENOUS | Status: AC
  Administered 2024-05-21: 0.25 mg via INTRAVENOUS
  Filled 2024-05-21: qty 5

## 2024-05-21 MED ORDER — SODIUM CHLORIDE 0.9 % IV SOLN
Freq: Once | INTRAVENOUS | Status: AC
  Filled 2024-05-21: qty 250

## 2024-05-21 MED ORDER — SODIUM CHLORIDE 0.9% FLUSH
10.0000 mL | INTRAVENOUS | Status: DC | PRN
Start: 2024-05-21 — End: 2024-05-21
  Administered 2024-05-21: 10 mL
  Filled 2024-05-21: qty 10

## 2024-05-21 MED ORDER — SODIUM CHLORIDE 0.9 % IV SOLN
2.5600 mg/m2 | Freq: Once | INTRAVENOUS | Status: AC
  Administered 2024-05-21: 4.6 mg via INTRAVENOUS
  Filled 2024-05-21: qty 9.2

## 2024-05-21 MED ORDER — HEPARIN SOD (PORK) LOCK FLUSH 100 UNIT/ML IV SOLN
500.0000 [IU] | Freq: Once | INTRAVENOUS | Status: AC | PRN
  Administered 2024-05-21: 500 [IU]
  Filled 2024-05-21: qty 5

## 2024-05-21 NOTE — Progress Notes (Signed)
 Hematology/Oncology Consult note [facility]  Telephone:([phone] [phone]  Patient Care Team: Epifanio Alm SQUIBB, MD as PCP - General (Infectious Diseases) Verdene Gills, RN as Oncology Nurse Navigator Melanee Annah BROCKS, MD as Consulting Physician (Hematology and Oncology)   Name of the patient: Doris Johnson  [id number]  [birth year]   Date of visit: 05/21/24  Diagnosis- extensive stage small cell lung cancer with liver lymph node and brain metastases   Chief complaint/ Reason for visit-on treatment assessment prior to next cycle of lurbinectedin   Heme/Onc history:  Patient is a 73 year old female with a remote history of smoking in her teenage years and exposure to passive smoking.  She finally had a CT chest with contrast done in October 2021 which showed a large mass in the left side of the mediastinum measuring 6.7 x 6.2 x 6.1 cm causing severe compression of the left main pulmonary artery and around the aortic arch in the left subclavian artery.  Enlarged thyroid  gland.  Left upper lobe nodule 1 x 0.7 cm.  She then underwent bronchoscopy with Dr.Aleskerov left upper lobe biopsy was nondiagnostic.  However station 4, 7 and 10 L lymph nodes were consistent with metastatic small cell carcinoma.   PET CT scan showed enlarged level 3 cervical lymph node 2.2 cm that was hypermetabolic with an SUV of 9.7.  Large central 7.3 cm AP window mass.  5.7 cm left liver mass.  MRI brain with and without contrast showed 2 small foci of enhancement in the right cerebellar hemisphere and right temporal lobe without mass-effect or edema as well concerning for brain metastases   Patient had 2 cycles of carbo etoposide  chemotherapy along with Tecentriq  and subsequent scan showed no significant improvement in the primary lung mass or liver mass.  She received radiation treatment to her primary lung mass and also seen by [facility] for second opinion.  Her pathology was reviewed  at [facility] and reported as possible neuroendocrine neoplasm But stated that small cell carcinoma cannot be excluded but not definitely diagnostic for it.  However [facility] pathology feels that this is consistent with small cell lung cancer.  She has completed 4 cycles of carbo etoposide  Tecentriq  chemotherapy and is currently on maintenance Tecentriq .  Repeat liver biopsy was also performed and was consistent with small cell lung cancer   Disease progression after 23 cycles of maintenance Tecentriq  in the left supraclavicular lymph node region.  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  Patient has been on lurbinectedin  since November 2023   Scans in May 2025 showed worsening brain mets.  There was also mild increase in theThe size of left supraclavicular lymph node from 1.2 cm to 2 cm but overall stable disease elsewhere.  Patient underwent second round of whole brain radiation and is receiving palliative radiation to her left supraclavicular lymph node  Interval history-patient tolerated second round of whole brain radiation well.  She has 3 more SBRT treatments remaining for her left supraclavicular lymph node.  Energy levels are stable.  No recent falls or episodes of confusion.  ECOG PS- 2 Pain scale- 0   Review of systems- Review of Systems  Constitutional:  Negative for chills, fever, malaise/fatigue and weight loss.  HENT:  Negative for congestion, ear discharge and nosebleeds.   Eyes:  Negative for blurred vision.  Respiratory:  Negative for cough, hemoptysis, sputum production, shortness of breath and wheezing.   Cardiovascular:  Negative for chest pain, palpitations, orthopnea and claudication.  Gastrointestinal:  Negative for abdominal pain, blood in stool, constipation, diarrhea, heartburn, melena, nausea and vomiting.  Genitourinary:  Negative for dysuria, flank pain, frequency, hematuria and urgency.  Musculoskeletal:  Negative for back pain, joint  pain and myalgias.  Skin:  Negative for rash.  Neurological:  Negative for dizziness, tingling, focal weakness, seizures, weakness and headaches.  Endo/Heme/Allergies:  Does not bruise/bleed easily.  Psychiatric/Behavioral:  Negative for depression and suicidal ideas. The patient does not have insomnia.       No Known Allergies   Past Medical History:  Diagnosis Date   Cancer (HCC)    Cataract    Diabetes mellitus without complication (HCC)    Hyperlipidemia    Hypertension    Hypothyroidism    doctor's keeping an eye on thyroid     Lung cancer [facility])      Past Surgical History:  Procedure Laterality Date   ABDOMINAL HYSTERECTOMY     IR CV LINE INJECTION  09/28/2021   MYRINGOTOMY WITH TUBE PLACEMENT Right 12/29/2022   Procedure: MYRINGOTOMY WITH TUBE PLACEMENT;  Surgeon: Juengel, Paul, MD;  Location: [facility];  Service: ENT;  Laterality: Right;  Diabetic   PORTA CATH INSERTION N/A 11/30/2020   Procedure: PORTA CATH INSERTION;  Surgeon: Marea Selinda RAMAN, MD;  Location: [facility] INVASIVE CV LAB;  Service: Cardiovascular;  Laterality: N/A;   VIDEO BRONCHOSCOPY WITH ENDOBRONCHIAL NAVIGATION N/A 10/21/2020   Procedure: VIDEO BRONCHOSCOPY WITH ENDOBRONCHIAL NAVIGATION;  Surgeon: Parris Manna, MD;  Location: [facility] ORS;  Service: Thoracic;  Laterality: N/A;   VIDEO BRONCHOSCOPY WITH ENDOBRONCHIAL ULTRASOUND N/A 10/21/2020   Procedure: VIDEO BRONCHOSCOPY WITH ENDOBRONCHIAL ULTRASOUND;  Surgeon: Parris Manna, MD;  Location: [facility] ORS;  Service: Thoracic;  Laterality: N/A;    Social History   Socioeconomic History   Marital status: Single    Spouse name: Not on file   Number of children: Not on file   Years of education: Not on file   Highest education level: Not on file  Occupational History   Not on file  Tobacco Use   Smoking status: Never   Smokeless tobacco: Never  Vaping Use   Vaping status: Never Used  Substance and Sexual Activity   Alcohol use: No   Drug use: No    Sexual activity: Not Currently  Other Topics Concern   Not on file  Social History Narrative   Not on file   Social Drivers of Health   Financial Resource Strain: High Risk (02/08/2024)   Received from [facility]   Overall Financial Resource Strain (CARDIA)    Difficulty of Paying Living Expenses: Hard  Food Insecurity: No Food Insecurity (04/04/2024)   Hunger Vital Sign    Worried About Running Out of Food in the Last Year: Never true    Ran Out of Food in the Last Year: Never true  Recent Concern: Food Insecurity - Food Insecurity Present (02/08/2024)   Received from [facility]   Hunger Vital Sign    Within the past 12 months, you worried that your food would run out before you got the money to buy more.: Sometimes true    Within the past 12 months, the food you bought just didn't last and you didn't have money to get more.: Sometimes true  Transportation Needs: No Transportation Needs (04/04/2024)   PRAPARE - Administrator, Civil Service (Medical): No    Lack of Transportation (Non-Medical): No  Physical Activity: Not on file  Stress: Not  on file  Social Connections: Not on file  Intimate Partner Violence: Not At Risk (04/04/2024)   Humiliation, Afraid, Rape, and Kick questionnaire    Fear of Current or Ex-Partner: No    Emotionally Abused: No    Physically Abused: No    Sexually Abused: No    Family History  Problem Relation Age of Onset   Hypertension Mother    Breast cancer Mother 52   Heart disease Father    Hyperlipidemia Brother    Heart disease Brother      Current Outpatient Medications:    apixaban  (ELIQUIS ) 5 MG TABS tablet, Take 1 tablet (5 mg total) by mouth 2 (two) times daily., Disp: 60 tablet, Rfl: 1   dexamethasone  (DECADRON ) 4 MG tablet, Take 1 tablet (4 mg total) by mouth 2 (two) times daily., Disp: 60 tablet, Rfl: 2   fluticasone  (FLONASE ) 50 MCG/ACT nasal spray, Place 2 sprays into both nostrils  daily., Disp: 16 g, Rfl: 5   furosemide  (LASIX ) 20 MG tablet, Take 20 mg by mouth., Disp: , Rfl:    lidocaine -prilocaine  (EMLA ) cream, Apply 1 Application topically as needed. Apply small amount to port site at least 1 hour prior to it being accessed, cover with plastic wrap, Disp: 30 g, Rfl: 1   losartan (COZAAR) 25 MG tablet, Take 1 tablet by mouth daily., Disp: , Rfl:    metFORMIN (GLUCOPHAGE-XR) 500 MG 24 hr tablet, Take by mouth., Disp: , Rfl:    mirtazapine (REMERON SOL-TAB) 15 MG disintegrating tablet, Take 15 mg by mouth at bedtime., Disp: , Rfl:    potassium chloride  SA (KLOR-CON  M) 20 MEQ tablet, TAKE 1 TABLET(20 MEQ) BY MOUTH DAILY, Disp: 90 tablet, Rfl: 2   traZODone (DESYREL) 50 MG tablet, Take 50 mg by mouth at bedtime., Disp: , Rfl:  No current facility-administered medications for this visit.  Facility-Administered Medications Ordered in Other Visits:    sodium chloride  flush (NS) 0.9 % injection 10 mL, 10 mL, Intravenous, PRN, Melanee Annah BROCKS, MD, 10 mL at 12/15/20 0850   sodium chloride  flush (NS) 0.9 % injection 10 mL, 10 mL, Intravenous, PRN, Melanee Annah BROCKS, MD, 10 mL at 03/07/23 0923  Physical exam:  Vitals:   05/21/24 0926  BP: 135/74  Pulse: (!) 56  Resp: 19  Temp: 98.4 F (36.9 C)  TempSrc: Tympanic  SpO2: 100%  Weight: 172 lb (78 kg)  Height: 5' 6 (1.676 m)   Physical Exam Cardiovascular:     Rate and Rhythm: Normal rate and regular rhythm.     Heart sounds: Normal heart sounds.  Pulmonary:     Effort: Pulmonary effort is normal.     Breath sounds: Normal breath sounds.  Skin:    General: Skin is warm and dry.  Neurological:     Mental Status: She is alert and oriented to person, place, and time.      I have personally reviewed labs listed below:    Latest Ref Rng & Units 04/23/2024    8:46 AM  CMP  Glucose 70 - 99 mg/dL 895   BUN 8 - 23 mg/dL 15   Creatinine 9.55 - 1.00 mg/dL 9.41   Sodium 864 - 854 mmol/L 137   Potassium 3.5 - 5.1 mmol/L 3.4    Chloride 98 - 111 mmol/L 106   CO2 22 - 32 mmol/L 24   Calcium 8.9 - 10.3 mg/dL 9.2   Total Protein 6.5 - 8.1 g/dL 7.1   Total Bilirubin 0.0 -  1.2 mg/dL 0.7   Alkaline Phos 38 - 126 U/L 77   AST 15 - 41 U/L 12   ALT 0 - 44 U/L 11       Latest Ref Rng & Units 04/29/2024    9:59 AM  CBC  WBC 4.0 - 10.5 K/uL 3.7   Hemoglobin 12.0 - 15.0 g/dL 87.4   Hematocrit 63.9 - 46.0 % 38.6   Platelets 150 - 400 K/uL 234       Assessment and plan- Patient is a 73 y.o. female with extensive stage small cell lung cancer with liver, lymph node and brain metastases.  She is here for on treatment assessment prior to cycle 28 of lurbinectedin   Patient last received lurbinectedin  on 03/26/2024.  Subsequently her scan showed worsening brain mets and therefore patient underwent a second round of whole brain radiation therapy.  She was also noted to have mild increase in the size of her left supraclavicular lymph node from 1.2 to 2 cm but stable disease elsewhere in the lung as well as liver.  She is currently undergoing SBRT to the subpleural clavicle lymph node and has 3 more fractions remaining.  She will proceed with cycle 28 of lurbinectedin  today and I will see her back in 4 weeks for cycle 29.  She gets Onpro Neulasta  with every cycle and we have been doing lurbinectedin  every 4 weeks instead of every 3 weeks.  I will plan to repeat another set of scans after cycle 30.   Visit Diagnosis 1. Encounter for antineoplastic chemotherapy   2. Small cell carcinoma of lung metastatic to liver [facility])      Dr. Annah Skene, MD, MPH [facility] at [facility] [phone] 05/21/2024 8:06 AM

## 2024-05-21 NOTE — Patient Instructions (Signed)
 [facility] - A DEPT OF [place]  Discharge Instructions: Thank you for choosing [facility] to provide your oncology and hematology care.  If you have a lab appointment with the Cancer Center, please go directly to the Cancer Center and check in at the registration area.  Wear comfortable clothing and clothing appropriate for easy access to any Portacath or PICC line.   We strive to give you quality time with your provider. You may need to reschedule your appointment if you arrive late (15 or more minutes).  Arriving late affects you and other patients whose appointments are after yours.  Also, if you miss three or more appointments without notifying the office, you may be dismissed from the clinic at the provider's discretion.      For prescription refill requests, have your pharmacy contact our office and allow 72 hours for refills to be completed.    Today you received the following chemotherapy and/or immunotherapy agents : lurbinectedin / pegfilgrastim     To help prevent nausea and vomiting after your treatment, we encourage you to take your nausea medication as directed.  BELOW ARE SYMPTOMS THAT SHOULD BE REPORTED IMMEDIATELY: *FEVER GREATER THAN 100.4 F (38 C) OR HIGHER *CHILLS OR SWEATING *NAUSEA AND VOMITING THAT IS NOT CONTROLLED WITH YOUR NAUSEA MEDICATION *UNUSUAL SHORTNESS OF BREATH *UNUSUAL BRUISING OR BLEEDING *URINARY PROBLEMS (pain or burning when urinating, or frequent urination) *BOWEL PROBLEMS (unusual diarrhea, constipation, pain near the anus) TENDERNESS IN MOUTH AND THROAT WITH OR WITHOUT PRESENCE OF ULCERS (sore throat, sores in mouth, or a toothache) UNUSUAL RASH, SWELLING OR PAIN  UNUSUAL VAGINAL DISCHARGE OR ITCHING   Items with * indicate a potential emergency and should be followed up as soon as possible or go to the Emergency Department if any problems should occur.  Please show the CHEMOTHERAPY ALERT CARD  or IMMUNOTHERAPY ALERT CARD at check-in to the Emergency Department and triage nurse.  Should you have questions after your visit or need to cancel or reschedule your appointment, please contact [facility] - A DEPT OF [facility]  [phone] and follow the prompts.  Office hours are 8:00 a.m. to 4:30 p.m. Monday - Friday. Please note that voicemails left after 4:00 p.m. may not be returned until the following business day.  We are closed weekends and major holidays. You have access to a nurse at all times for urgent questions. Please call the main number to the clinic [phone] and follow the prompts.  For any non-urgent questions, you may also contact your provider using MyChart. We now offer e-Visits for anyone 37 and older to request care online for non-urgent symptoms. For details visit [email or web address].   Also download the MyChart app! Go to the app store, search MyChart, open the app, select [place], and log in with your MyChart username and password.

## 2024-05-22 ENCOUNTER — Other Ambulatory Visit: Payer: Self-pay

## 2024-05-22 ENCOUNTER — Ambulatory Visit
Admission: RE | Admit: 2024-05-22 | Discharge: 2024-05-22 | Disposition: A | Discharge status: Home / Self Care | Source: Ambulatory Visit | Attending: Radiation Oncology | Admitting: Radiation Oncology

## 2024-05-22 DIAGNOSIS — C787 Secondary malignant neoplasm of liver and intrahepatic bile duct: Secondary | ICD-10-CM | POA: Diagnosis not present

## 2024-05-22 DIAGNOSIS — C7931 Secondary malignant neoplasm of brain: Secondary | ICD-10-CM | POA: Diagnosis not present

## 2024-05-22 DIAGNOSIS — C3490 Malignant neoplasm of unspecified part of unspecified bronchus or lung: Secondary | ICD-10-CM | POA: Diagnosis not present

## 2024-05-22 LAB — RAD ONC ARIA SESSION SUMMARY
Course Elapsed Days: 35
Plan Fractions Treated to Date: 3
Plan Prescribed Dose Per Fraction: 10 Gy
Plan Total Fractions Prescribed: 5
Plan Total Prescribed Dose: 50 Gy
Reference Point Dosage Given to Date: 30 Gy
Reference Point Session Dosage Given: 10 Gy
Session Number: 18

## 2024-05-27 ENCOUNTER — Other Ambulatory Visit: Payer: Self-pay

## 2024-05-27 ENCOUNTER — Ambulatory Visit
Admission: RE | Admit: 2024-05-27 | Discharge: 2024-05-27 | Disposition: A | Discharge status: Home / Self Care | Source: Ambulatory Visit | Attending: Radiation Oncology | Admitting: Radiation Oncology

## 2024-05-27 DIAGNOSIS — C787 Secondary malignant neoplasm of liver and intrahepatic bile duct: Secondary | ICD-10-CM | POA: Diagnosis not present

## 2024-05-27 DIAGNOSIS — C7931 Secondary malignant neoplasm of brain: Secondary | ICD-10-CM | POA: Diagnosis not present

## 2024-05-27 DIAGNOSIS — C3490 Malignant neoplasm of unspecified part of unspecified bronchus or lung: Secondary | ICD-10-CM | POA: Diagnosis not present

## 2024-05-27 LAB — RAD ONC ARIA SESSION SUMMARY
Course Elapsed Days: 40
Plan Fractions Treated to Date: 4
Plan Prescribed Dose Per Fraction: 10 Gy
Plan Total Fractions Prescribed: 5
Plan Total Prescribed Dose: 50 Gy
Reference Point Dosage Given to Date: 40 Gy
Reference Point Session Dosage Given: 10 Gy
Session Number: 19

## 2024-05-29 ENCOUNTER — Ambulatory Visit
Admission: RE | Admit: 2024-05-29 | Discharge: 2024-05-29 | Disposition: A | Discharge status: Home / Self Care | Source: Ambulatory Visit | Attending: Radiation Oncology | Admitting: Radiation Oncology

## 2024-05-29 ENCOUNTER — Other Ambulatory Visit: Payer: Self-pay

## 2024-05-29 DIAGNOSIS — C3490 Malignant neoplasm of unspecified part of unspecified bronchus or lung: Secondary | ICD-10-CM | POA: Diagnosis not present

## 2024-05-29 DIAGNOSIS — C787 Secondary malignant neoplasm of liver and intrahepatic bile duct: Secondary | ICD-10-CM | POA: Diagnosis not present

## 2024-05-29 DIAGNOSIS — C7931 Secondary malignant neoplasm of brain: Secondary | ICD-10-CM | POA: Diagnosis not present

## 2024-05-29 LAB — RAD ONC ARIA SESSION SUMMARY
Course Elapsed Days: 42
Plan Fractions Treated to Date: 5
Plan Prescribed Dose Per Fraction: 10 Gy
Plan Total Fractions Prescribed: 5
Plan Total Prescribed Dose: 50 Gy
Reference Point Dosage Given to Date: 50 Gy
Reference Point Session Dosage Given: 10 Gy
Session Number: 20

## 2024-05-30 ENCOUNTER — Telehealth: Payer: Self-pay

## 2024-05-30 ENCOUNTER — Encounter: Payer: Self-pay | Admitting: Oncology

## 2024-05-30 NOTE — Telephone Encounter (Signed)
 [employer] requested P2P for auth request on Lurbinectedin  on 05/30/24.  Per Dr. Babara Per Dr. Babara talked to Dr. Tobie. it is not approved as patient has developed progression. Currently pt is receiving SBRT to the site with progression.   2 options, either denial vs withdraw the case and wait for Dr. Melanee is back to do P2P. I opted for withdraw and let Dr. Melanee to do P2P. please cancel her next visit. postpone treatment until Dr. Melanee is back. Dr. Babara  advised keep it for now (appt 8/5). Doris Johnson, when Dr. Melanee is back on 8.4, please remind her to call Dr. Tobie for P2p.  Contact information as follows: Dr. Tobie phone [phone] member id# [id number].  Dr. Babara proceeded by saying Doris Johnson, I am not sure how this withdraw and re submit p2p process goes. Dr. Melanee will be back 8.4, please arrange opportunity for her to do another P2p thanks. Doris Johnson responded the request has been canceled--I will request auth again closer to appt date. Forwarding this to you Dr. Melanee so I don't forget; will drop a reminder on 8/4 as well.

## 2024-05-30 NOTE — Radiation Completion Notes (Signed)
 Patient Name: Doris Johnson, GALVIS MRN: [id number] Date of Birth: 1951-06-24 Referring Physician: ALM NEEDLE, M.D. Date of Service: 2024-05-30 Radiation Oncologist: Marcey Penton, M.D. [facility]                             RADIATION ONCOLOGY END OF TREATMENT NOTE     Diagnosis: C79.31 Secondary malignant neoplasm of brain; C77.1 Secondary and unspecified malignant neoplasm of intrathoracic lymph nodes Staging on 2020-11-12: Small cell carcinoma of lung metastatic to liver (HCC) T=cT4, N=cNX, M=pM1c Intent: Curative     HPI: Patient is a 73 year old female well-known department having received whole brain radiation therapy for extensive stage small cell lung cancer with brain involvement back in 202 3..  She had also received radiation therapy to her chest for metastatic small cell lung cancer back in 22.  She initially presented with a 6.7 cm left sided mediastinal mass causing compression of the left main pulmonary artery.  PET scan demonstrated 5.7 cm liver metastasis as well as lung findings.  Biopsy of her lymph nodes were positive for metastatic small cell lung cancer.  MRI scan initially showed 2 foci of enhancement the right cerebral hemisphere as well is right temporal lobe without mass effect.  Patient have carpal etoposide  along with Tecentriq  showing no significant improvement in the liver or lung mass.  Patient had been on maintenance Tecentriq  she was recently referred to me for persistent left supraclavicular nodal involvement with biopsy showing high-grade neuroendocrine carcinoma compatible with lung primary.  She has been receiving lurbinectedin .  Brain MRI recently was performed showing numerous new lesions throughout cerebral cerebral hemispheres and cerebellum at least 16 with previously noted lesions in the right parietal lobe and right temporal lobe and right cerebellum increasing in size.  She also has new vasogenic edema in the right parietal lobe  and right cerebellum.  She is doing fairly well she is having some headaches.  No focal neurologic deficits no change in visual fields proprioception is intact.  She is accompanied by her daughter      ==========DELIVERED PLANS==========  First Treatment Date: 2024-04-17 Last Treatment Date: 2024-05-29   Plan Name: Brain_Whole Site: Brain Technique: Isodose Plan Mode: Photon Dose Per Fraction: 2 Gy Prescribed Dose (Delivered / Prescribed): 30 Gy / 30 Gy Prescribed Fxs (Delivered / Prescribed): 15 / 15   Plan Name: HN_Sclv_SBRT Site: Sclav-LT Technique: SBRT/SRT-IMRT Mode: Photon Dose Per Fraction: 10 Gy Prescribed Dose (Delivered / Prescribed): 50 Gy / 50 Gy Prescribed Fxs (Delivered / Prescribed): 5 / 5     ==========ON TREATMENT VISIT DATES========== 2024-04-23, 2024-04-30, 2024-05-07, 2024-05-16, 2024-05-20, 2024-05-22, 2024-05-27, 2024-05-29, 2024-05-29     ==========UPCOMING VISITS========== 07/04/2024 [facility] ONCOLOGY FOLLOW UP 30 Penton Marcey, MD  06/28/2024 [facility] EST PT Buckley Arthea POUR, MD  06/18/2024 [facility] INFUSION [facility]- MO INFUSION CHAIR 2  06/18/2024 [facility] EST PT Dasie Tinnie MATSU, NP  06/18/2024 [facility] INF PORT FLUSH W/LAB [facility]-PORT FLUSH        ==========APPENDIX - ON TREATMENT VISIT NOTES==========   See weekly On Treatment Notes in Epic for details in the Media tab (listed as Progress notes on the On Treatment Visit Dates listed above).

## 2024-06-03 ENCOUNTER — Encounter: Payer: Self-pay | Admitting: Oncology

## 2024-06-03 ENCOUNTER — Telehealth: Payer: Self-pay | Admitting: *Deleted

## 2024-06-03 DIAGNOSIS — J029 Acute pharyngitis, unspecified: Secondary | ICD-10-CM | POA: Diagnosis not present

## 2024-06-03 NOTE — Telephone Encounter (Signed)
 Attempted to contact pt to inform of referral to [facility] for second opinion. Pt did not answer and unable to leave message at this time. Will try again later. Pt previously saw Dr. Annell in June 2022. Referral faxed to his clinic for second opinion.

## 2024-06-03 NOTE — Telephone Encounter (Signed)
-----   Message from Annah JAYSON Skene sent at 05/30/2024 10:18 PM EDT ----- Regarding: RE: If you already did p2p and they did not agree, I don't think me doing another p2p will help. We should withdraw the case and refer her to [facility] for second opinion. She may qualify for tarlatamab if she is interested ----- Message ----- From: Babara Call, MD Sent: 05/30/2024   2:29 PM EDT To: Annah JAYSON Skene, MD  Annah,  Insurance denied Lurbinectedin .  I did P2P and spoke to Dr. Tobie. It was not approved as patient has developed progression, brain mets were worse, and supraclavicular Ln is larger. I understand that you want to continue Lurbinectedin  as pt is receiving local treatments to the sites with progression and other disease sites are stable but Dr. Tobie feels pt should move on to next line treatments.   There are 2 options, either denial vs withdraw the case and wait for you are back to do P2P. I opted for withdraw and let you try P2P.SABRA Her next treatment is 8/5  Thanks    Call

## 2024-06-04 ENCOUNTER — Telehealth: Payer: Self-pay | Admitting: Oncology

## 2024-06-04 ENCOUNTER — Encounter: Payer: Self-pay | Admitting: Oncology

## 2024-06-04 NOTE — Telephone Encounter (Signed)
 Pt brother called and asked us  to contact pt daughter with new appt time. Pt daughter Carlynn) is to be the first contact for [place] appts since she helps coordinate for rides to appts.

## 2024-06-04 NOTE — Telephone Encounter (Signed)
 Called pt daughter to notify her of appt change. (Not sure if anyone else has already done this.) Pt brother called and requested that we call pt daughter Carlynn) and let her know since she is to be the first point of contact for [place] appts for pt. Left vm.

## 2024-06-04 NOTE — Telephone Encounter (Signed)
 Spoke with patient this morning and she is aware of referral to [facility] and in agreement. Pt requested to inform her brother, Zachary, and to have [facility] call him with the appt. LVM with Zachary to call back to further discuss referral.

## 2024-06-04 NOTE — Telephone Encounter (Signed)
 Called the patients brother and left him a voicemail about the change in her appts on 8/5. She is now scheduled to ocm ein at 10:15 instead of 9. I told him to call me back if he had any questions.

## 2024-06-05 ENCOUNTER — Telehealth: Payer: Self-pay | Admitting: *Deleted

## 2024-06-05 NOTE — Telephone Encounter (Signed)
 Pt scheduled to see Dr. Annell on 06/13/24.

## 2024-06-05 NOTE — Telephone Encounter (Signed)
 He will attend no if she is has an appointment at [facility] yet.  Per [facility] Navigator checked yesterday for Mr. Buddy.  Then today he called me asking the same thing he wanted the direct number to call over at [facility].  They gave him the information [phone] option #1.  Then he wanted to make sure that all the information that needs to be done for [facility] and goes through the daughter Maranda Hough D and then I called back to [facility] and I was told that they are getting in touch with Hough Maranda.  They said that it is hard to get in touch with her because the hours that they work their in the hours that the doctor is there is basically the same hour so it is hard to get in touch with each other.  I have now sent a email to [facility] about the appointment that when they set it up

## 2024-06-06 ENCOUNTER — Ambulatory Visit: Admitting: Radiation Oncology

## 2024-06-13 DIAGNOSIS — C7931 Secondary malignant neoplasm of brain: Secondary | ICD-10-CM | POA: Diagnosis not present

## 2024-06-13 DIAGNOSIS — C3400 Malignant neoplasm of unspecified main bronchus: Secondary | ICD-10-CM | POA: Diagnosis not present

## 2024-06-13 DIAGNOSIS — Z87891 Personal history of nicotine dependence: Secondary | ICD-10-CM | POA: Diagnosis not present

## 2024-06-18 ENCOUNTER — Encounter: Payer: Self-pay | Admitting: Oncology

## 2024-06-18 ENCOUNTER — Ambulatory Visit

## 2024-06-18 ENCOUNTER — Other Ambulatory Visit

## 2024-06-18 ENCOUNTER — Ambulatory Visit: Admitting: Nurse Practitioner

## 2024-06-18 ENCOUNTER — Inpatient Hospital Stay: Attending: Oncology

## 2024-06-18 ENCOUNTER — Inpatient Hospital Stay (HOSPITAL_BASED_OUTPATIENT_CLINIC_OR_DEPARTMENT_OTHER): Admitting: Oncology

## 2024-06-18 DIAGNOSIS — Z8249 Family history of ischemic heart disease and other diseases of the circulatory system: Secondary | ICD-10-CM | POA: Insufficient documentation

## 2024-06-18 DIAGNOSIS — I10 Essential (primary) hypertension: Secondary | ICD-10-CM | POA: Insufficient documentation

## 2024-06-18 DIAGNOSIS — C787 Secondary malignant neoplasm of liver and intrahepatic bile duct: Secondary | ICD-10-CM

## 2024-06-18 DIAGNOSIS — Z90710 Acquired absence of both cervix and uterus: Secondary | ICD-10-CM | POA: Insufficient documentation

## 2024-06-18 DIAGNOSIS — M47817 Spondylosis without myelopathy or radiculopathy, lumbosacral region: Secondary | ICD-10-CM | POA: Insufficient documentation

## 2024-06-18 DIAGNOSIS — Z83438 Family history of other disorder of lipoprotein metabolism and other lipidemia: Secondary | ICD-10-CM | POA: Insufficient documentation

## 2024-06-18 DIAGNOSIS — Z5111 Encounter for antineoplastic chemotherapy: Secondary | ICD-10-CM | POA: Diagnosis present

## 2024-06-18 DIAGNOSIS — C7931 Secondary malignant neoplasm of brain: Secondary | ICD-10-CM | POA: Diagnosis not present

## 2024-06-18 DIAGNOSIS — C3412 Malignant neoplasm of upper lobe, left bronchus or lung: Secondary | ICD-10-CM | POA: Diagnosis not present

## 2024-06-18 DIAGNOSIS — Z7722 Contact with and (suspected) exposure to environmental tobacco smoke (acute) (chronic): Secondary | ICD-10-CM | POA: Insufficient documentation

## 2024-06-18 DIAGNOSIS — Z923 Personal history of irradiation: Secondary | ICD-10-CM | POA: Insufficient documentation

## 2024-06-18 DIAGNOSIS — Z803 Family history of malignant neoplasm of breast: Secondary | ICD-10-CM | POA: Insufficient documentation

## 2024-06-18 DIAGNOSIS — Z79899 Other long term (current) drug therapy: Secondary | ICD-10-CM | POA: Insufficient documentation

## 2024-06-18 DIAGNOSIS — M4316 Spondylolisthesis, lumbar region: Secondary | ICD-10-CM | POA: Insufficient documentation

## 2024-06-18 DIAGNOSIS — Z5941 Food insecurity: Secondary | ICD-10-CM | POA: Insufficient documentation

## 2024-06-18 DIAGNOSIS — I700 Atherosclerosis of aorta: Secondary | ICD-10-CM | POA: Insufficient documentation

## 2024-06-18 DIAGNOSIS — R590 Localized enlarged lymph nodes: Secondary | ICD-10-CM | POA: Diagnosis not present

## 2024-06-18 DIAGNOSIS — C3490 Malignant neoplasm of unspecified part of unspecified bronchus or lung: Secondary | ICD-10-CM | POA: Diagnosis not present

## 2024-06-18 DIAGNOSIS — Z7901 Long term (current) use of anticoagulants: Secondary | ICD-10-CM | POA: Diagnosis not present

## 2024-06-18 DIAGNOSIS — Z87891 Personal history of nicotine dependence: Secondary | ICD-10-CM | POA: Diagnosis not present

## 2024-06-18 DIAGNOSIS — Z5986 Financial insecurity: Secondary | ICD-10-CM | POA: Diagnosis not present

## 2024-06-18 DIAGNOSIS — R160 Hepatomegaly, not elsewhere classified: Secondary | ICD-10-CM | POA: Diagnosis not present

## 2024-06-18 DIAGNOSIS — Z86718 Personal history of other venous thrombosis and embolism: Secondary | ICD-10-CM | POA: Diagnosis not present

## 2024-06-18 LAB — COMPREHENSIVE METABOLIC PANEL WITH GFR
ALT: 9 U/L (ref 0–44)
AST: 14 U/L — ABNORMAL LOW (ref 15–41)
Albumin: 3.9 g/dL (ref 3.5–5.0)
Alkaline Phosphatase: 37 U/L — ABNORMAL LOW (ref 38–126)
Anion gap: 8 (ref 5–15)
BUN: 12 mg/dL (ref 8–23)
CO2: 26 mmol/L (ref 22–32)
Calcium: 9.7 mg/dL (ref 8.9–10.3)
Chloride: 105 mmol/L (ref 98–111)
Creatinine, Ser: 0.75 mg/dL (ref 0.44–1.00)
GFR, Estimated: >60 mL/min (ref >60)
Glucose, Bld: 102 mg/dL — ABNORMAL HIGH (ref 70–99)
Potassium: 3.1 mmol/L — ABNORMAL LOW (ref 3.5–5.1)
Sodium: 139 mmol/L (ref 135–145)
Total Bilirubin: 0.8 mg/dL (ref 0.0–1.2)
Total Protein: 7.1 g/dL (ref 6.5–8.1)

## 2024-06-18 LAB — CBC WITH DIFFERENTIAL/PLATELET
Abs Immature Granulocytes: 0.01 K/uL (ref 0.00–0.07)
Basophils Absolute: 0 K/uL (ref 0.0–0.1)
Basophils Relative: 0 %
Eosinophils Absolute: 0 K/uL (ref 0.0–0.5)
Eosinophils Relative: 1 %
HCT: 37.1 % (ref 36.0–46.0)
Hemoglobin: 12.6 g/dL (ref 12.0–15.0)
Immature Granulocytes: 0 %
Lymphocytes Relative: 23 %
Lymphs Abs: 0.6 K/uL — ABNORMAL LOW (ref 0.7–4.0)
MCH: 30.9 pg (ref 26.0–34.0)
MCHC: 34 g/dL (ref 30.0–36.0)
MCV: 90.9 fL (ref 80.0–100.0)
Monocytes Absolute: 0.4 K/uL (ref 0.1–1.0)
Monocytes Relative: 15 %
Neutro Abs: 1.6 K/uL — ABNORMAL LOW (ref 1.7–7.7)
Neutrophils Relative %: 61 %
Platelets: 177 K/uL (ref 150–400)
RBC: 4.08 MIL/uL (ref 3.87–5.11)
RDW: 12.8 % (ref 11.5–15.5)
WBC: 2.6 K/uL — ABNORMAL LOW (ref 4.0–10.5)
nRBC: 0 % (ref 0.0–0.2)

## 2024-06-18 MED ORDER — LIDOCAINE-PRILOCAINE 2.5-2.5 % EX CREA: 1.0000 | TOPICAL_CREAM | CUTANEOUS | 1 refills | Status: AC | PRN

## 2024-06-18 NOTE — Progress Notes (Signed)
 Hematology/Oncology Consult note [facility]  [phone] [phone]  Patient Care Team: Epifanio Alm SQUIBB, MD as PCP - General (Infectious Diseases) Verdene Gills, RN as Oncology Nurse Navigator Melanee Annah BROCKS, MD as Consulting Physician (Hematology and Oncology)   Name of the patient: Doris Johnson  [id number]  November 11, 1951   Date of visit: 06/18/24  Diagnosis-  extensive stage small cell lung cancer with liver lymph node and brain metastases   Chief complaint/ Reason for visit-discuss further management of small cell lung cancer  Heme/Onc history: Patient is a 73 year old female with a remote history of smoking in her teenage years and exposure to passive smoking.  She finally had a CT chest with contrast done in October 2021 which showed a large mass in the left side of the mediastinum measuring 6.7 x 6.2 x 6.1 cm causing severe compression of the left main pulmonary artery and around the aortic arch in the left subclavian artery.  Enlarged thyroid  gland.  Left upper lobe nodule 1 x 0.7 cm.  She then underwent bronchoscopy with Dr.Aleskerov left upper lobe biopsy was nondiagnostic.  However station 4, 7 and 10 L lymph nodes were consistent with metastatic small cell carcinoma.   PET CT scan showed enlarged level 3 cervical lymph node 2.2 cm that was hypermetabolic with an SUV of 9.7.  Large central 7.3 cm AP window mass.  5.7 cm left liver mass.  MRI brain with and without contrast showed 2 small foci of enhancement in the right cerebellar hemisphere and right temporal lobe without mass-effect or edema as well concerning for brain metastases   Patient had 2 cycles of carbo etoposide  chemotherapy along with Tecentriq  and subsequent scan showed no significant improvement in the primary lung mass or liver mass.  She received radiation treatment to her primary lung mass and also seen by [facility] for second opinion.  Her pathology was reviewed at [facility]  and reported as possible neuroendocrine neoplasm But stated that small cell carcinoma cannot be excluded but not definitely diagnostic for it.  However [facility] pathology feels that this is consistent with small cell lung cancer.  She has completed 4 cycles of carbo etoposide  Tecentriq  chemotherapy and is currently on maintenance Tecentriq .  Repeat liver biopsy was also performed and was consistent with small cell lung cancer   Disease progression after 23 cycles of maintenance Tecentriq  in the left supraclavicular lymph node region.  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  Patient has been on lurbinectedin  since November 2023   Scans in May 2025 showed worsening brain mets.  There was also mild increase in theThe size of left supraclavicular lymph node from 1.2 cm to 2 cm but overall stable disease elsewhere.  Patient underwent second round of whole brain radiation and is received palliative radiation to her left supraclavicular lymph node  Interval history-patient is doing well presently and denies any specific complaints at this time  ECOG PS- 1 Pain scale- 0   Review of systems- Review of Systems  Constitutional:  Negative for chills, fever, malaise/fatigue and weight loss.  HENT:  Negative for congestion, ear discharge and nosebleeds.   Eyes:  Negative for blurred vision.  Respiratory:  Negative for cough, hemoptysis, sputum production, shortness of breath and wheezing.   Cardiovascular:  Negative for chest pain, palpitations, orthopnea and claudication.  Gastrointestinal:  Negative for abdominal pain, blood in stool, constipation, diarrhea, heartburn, melena, nausea and vomiting.  Genitourinary:  Negative for dysuria, flank  pain, frequency, hematuria and urgency.  Musculoskeletal:  Negative for back pain, joint pain and myalgias.  Skin:  Negative for rash.  Neurological:  Negative for dizziness, tingling, focal weakness, seizures, weakness and  headaches.  Endo/Heme/Allergies:  Does not bruise/bleed easily.  Psychiatric/Behavioral:  Negative for depression and suicidal ideas. The patient does not have insomnia.       No Known Allergies   Past Medical History:  Diagnosis Date   Cancer (HCC)    Cataract    Diabetes mellitus without complication (HCC)    Hyperlipidemia    Hypertension    Hypothyroidism    doctor's keeping an eye on thyroid     Lung cancer [facility])      Past Surgical History:  Procedure Laterality Date   ABDOMINAL HYSTERECTOMY     IR CV LINE INJECTION  09/28/2021   MYRINGOTOMY WITH TUBE PLACEMENT Right 12/29/2022   Procedure: MYRINGOTOMY WITH TUBE PLACEMENT;  Surgeon: Juengel, Paul, MD;  Location: [facility];  Service: ENT;  Laterality: Right;  Diabetic   PORTA CATH INSERTION N/A 11/30/2020   Procedure: PORTA CATH INSERTION;  Surgeon: Marea Selinda RAMAN, MD;  Location: [facility] INVASIVE CV LAB;  Service: Cardiovascular;  Laterality: N/A;   VIDEO BRONCHOSCOPY WITH ENDOBRONCHIAL NAVIGATION N/A 10/21/2020   Procedure: VIDEO BRONCHOSCOPY WITH ENDOBRONCHIAL NAVIGATION;  Surgeon: Parris Manna, MD;  Location: [facility] ORS;  Service: Thoracic;  Laterality: N/A;   VIDEO BRONCHOSCOPY WITH ENDOBRONCHIAL ULTRASOUND N/A 10/21/2020   Procedure: VIDEO BRONCHOSCOPY WITH ENDOBRONCHIAL ULTRASOUND;  Surgeon: Parris Manna, MD;  Location: [facility] ORS;  Service: Thoracic;  Laterality: N/A;    Social History   Socioeconomic History   Marital status: Single    Spouse name: Not on file   Number of children: Not on file   Years of education: Not on file   Highest education level: Not on file  Occupational History   Not on file  Tobacco Use   Smoking status: Never   Smokeless tobacco: Never  Vaping Use   Vaping status: Never Used  Substance and Sexual Activity   Alcohol use: No   Drug use: No   Sexual activity: Not Currently  Other Topics Concern   Not on file  Social History Narrative   Not on file   Social Drivers of  Health   Financial Resource Strain: High Risk (02/08/2024)   Received from [facility]   Overall Financial Resource Strain (CARDIA)    Difficulty of Paying Living Expenses: Hard  Food Insecurity: No Food Insecurity (04/04/2024)   Hunger Vital Sign    Worried About Running Out of Food in the Last Year: Never true    Ran Out of Food in the Last Year: Never true  Recent Concern: Food Insecurity - Food Insecurity Present (02/08/2024)   Received from [facility]   Hunger Vital Sign    Within the past 12 months, you worried that your food would run out before you got the money to buy more.: Sometimes true    Within the past 12 months, the food you bought just didn't last and you didn't have money to get more.: Sometimes true  Transportation Needs: No Transportation Needs (04/04/2024)   PRAPARE - Administrator, Civil Service (Medical): No    Lack of Transportation (Non-Medical): No  Physical Activity: Not on file  Stress: Not on file  Social Connections: Not on file  Intimate Partner Violence: Not At Risk (04/04/2024)   Humiliation, Afraid, Rape, and Kick  questionnaire    Fear of Current or Ex-Partner: No    Emotionally Abused: No    Physically Abused: No    Sexually Abused: No    Family History  Problem Relation Age of Onset   Hypertension Mother    Breast cancer Mother 18   Heart disease Father    Hyperlipidemia Brother    Heart disease Brother      Current Outpatient Medications:    apixaban  (ELIQUIS ) 5 MG TABS tablet, Take 1 tablet (5 mg total) by mouth 2 (two) times daily., Disp: 60 tablet, Rfl: 1   dexamethasone  (DECADRON ) 4 MG tablet, Take 1 tablet (4 mg total) by mouth 2 (two) times daily., Disp: 60 tablet, Rfl: 2   fluticasone  (FLONASE ) 50 MCG/ACT nasal spray, Place 2 sprays into both nostrils daily., Disp: 16 g, Rfl: 5   furosemide  (LASIX ) 20 MG tablet, Take 20 mg by mouth., Disp: , Rfl:    losartan (COZAAR) 25 MG tablet, Take  1 tablet by mouth daily., Disp: , Rfl:    metFORMIN (GLUCOPHAGE-XR) 500 MG 24 hr tablet, Take by mouth., Disp: , Rfl:    mirtazapine (REMERON SOL-TAB) 15 MG disintegrating tablet, Take 15 mg by mouth at bedtime., Disp: , Rfl:    potassium chloride  SA (KLOR-CON  M) 20 MEQ tablet, TAKE 1 TABLET(20 MEQ) BY MOUTH DAILY, Disp: 90 tablet, Rfl: 2   traZODone (DESYREL) 50 MG tablet, Take 50 mg by mouth at bedtime., Disp: , Rfl:    lidocaine -prilocaine  (EMLA ) cream, Apply 1 Application topically as needed. Apply small amount to port site at least 1 hour prior to it being accessed, cover with plastic wrap, Disp: 30 g, Rfl: 1 No current facility-administered medications for this visit.  Facility-Administered Medications Ordered in Other Visits:    sodium chloride  flush (NS) 0.9 % injection 10 mL, 10 mL, Intravenous, PRN, Melanee Annah BROCKS, MD, 10 mL at 12/15/20 0850   sodium chloride  flush (NS) 0.9 % injection 10 mL, 10 mL, Intravenous, PRN, Melanee Annah BROCKS, MD, 10 mL at 03/07/23 0923  Physical exam:  Vitals:   06/18/24 1033  BP: (!) 161/75  Pulse: (!) 51  Resp: 18  Temp: 98 F (36.7 C)  TempSrc: Tympanic  SpO2: 100%  Weight: 167 lb 9.6 oz (76 kg)  Height: 5' 6 (1.676 m)   Physical Exam Cardiovascular:     Rate and Rhythm: Normal rate and regular rhythm.     Heart sounds: Normal heart sounds.  Pulmonary:     Effort: Pulmonary effort is normal.     Breath sounds: Normal breath sounds.  Abdominal:     General: Bowel sounds are normal.     Palpations: Abdomen is soft.  Skin:    General: Skin is warm and dry.  Neurological:     Mental Status: She is alert and oriented to person, place, and time.      I have personally reviewed labs listed below:    Latest Ref Rng & Units 06/18/2024   10:20 AM  CMP  Glucose 70 - 99 mg/dL 897   BUN 8 - 23 mg/dL 12   Creatinine 9.55 - 1.00 mg/dL 9.24   Sodium 864 - 854 mmol/L 139   Potassium 3.5 - 5.1 mmol/L 3.1   Chloride 98 - 111 mmol/L 105   CO2 22 -  32 mmol/L 26   Calcium 8.9 - 10.3 mg/dL 9.7   Total Protein 6.5 - 8.1 g/dL 7.1   Total Bilirubin 0.0 - 1.2 mg/dL  0.8   Alkaline Phos 38 - 126 U/L 37   AST 15 - 41 U/L 14   ALT 0 - 44 U/L 9       Latest Ref Rng & Units 06/18/2024   10:20 AM  CBC  WBC 4.0 - 10.5 K/uL 2.6   Hemoglobin 12.0 - 15.0 g/dL 87.3   Hematocrit 63.9 - 46.0 % 37.1   Platelets 150 - 400 K/uL 177       Assessment and plan- Patient is a 73 y.o. female with extensive stage small cell lung cancer here for a routine follow-up  Patient has been on lurbinectedin  since November 2023.  Scans in May 2025 showed worsening brain metastases for which she received second round of whole brain radiation.  Outside of her brain she mostly had stable disease on lurbinectedin  but had mild increase in the size of her left supraclavicular lymph node for which she underwent palliative radiation.  Patient was also seen by Dr. Annell, for second opinion since patient's insurance has denied further use of lurbinectedin .  I am planning to repeat CT chest abdomen pelvis with contrast within the next 2 weeks and see her thereafter.  If CT scan shows no evidence of disease progression I will continue to watch her off all treatment and consider treatment at progression.  If patient is found to have any evidence of systemic progression I will refer her back to [facility] for consideration of third line to tarlatamab treatment since we are unable to offer all those treatments here even if patient prefers to get treatment locally.  I will reach out to our pharmacy team here to see if we could give her outpatient tarlatamab after she has first couple of months of treatment at [facility] if need be.  Patient is also scheduled to undergo MRI brain in 10 days time.  She comprehends my plan well  History of renal vein thrombosis: Continue Eliquis    Visit Diagnosis 1. Small cell carcinoma of lung metastatic to liver [facility])      Dr. Annah Skene, MD, MPH [facility] [phone] 06/18/2024 2:43 PM

## 2024-06-18 NOTE — Progress Notes (Signed)
 Patient feeling okay today with no new acute concerns.

## 2024-06-18 NOTE — Progress Notes (Signed)
 Outbound call to [facility] [phone]; spoke to Doris Johnson who will also fill the refill for her potassium. Doris Johnson said both emla  and potassium should be ready in about 90 minutes.

## 2024-06-20 ENCOUNTER — Telehealth: Payer: Self-pay | Admitting: *Deleted

## 2024-06-20 ENCOUNTER — Encounter: Payer: Self-pay | Admitting: Oncology

## 2024-06-20 NOTE — Telephone Encounter (Signed)
 The daughter states that she needs more time out with her mother and she needs to be 2-3 times a week for her now.  They do need to just change how many times she can come to be 2-3 times a week if needed.

## 2024-06-20 NOTE — Telephone Encounter (Signed)
 I am ok with changing to that frequency

## 2024-06-20 NOTE — Telephone Encounter (Addendum)
 Spoke to [place] regarding frequency of FMLA.  She does not have a specific request for days but concerned that the 3 episodes with 2 days per episode a month may not be sufficient for future visits.  Patient is scheduled for imaging this month and f/u with MD to discuss treatment planning.    Will wait until MD visit to re-assess need for FMLA change.

## 2024-06-25 ENCOUNTER — Encounter

## 2024-06-25 ENCOUNTER — Telehealth: Payer: Self-pay | Admitting: Internal Medicine

## 2024-06-25 ENCOUNTER — Ambulatory Visit

## 2024-06-25 NOTE — Telephone Encounter (Signed)
 Called pt and her daughter and could not reach them. I called pts brother and lvm with information on the change in date and time of appt with Dr. Buckley. I asked him to call me back with any questions

## 2024-06-27 ENCOUNTER — Ambulatory Visit
Admission: RE | Admit: 2024-06-27 | Discharge: 2024-06-27 | Disposition: A | Discharge status: Home / Self Care | Source: Ambulatory Visit | Attending: Oncology | Admitting: Oncology

## 2024-06-27 DIAGNOSIS — C3490 Malignant neoplasm of unspecified part of unspecified bronchus or lung: Secondary | ICD-10-CM | POA: Diagnosis not present

## 2024-06-27 DIAGNOSIS — C787 Secondary malignant neoplasm of liver and intrahepatic bile duct: Secondary | ICD-10-CM | POA: Insufficient documentation

## 2024-06-27 DIAGNOSIS — I700 Atherosclerosis of aorta: Secondary | ICD-10-CM | POA: Diagnosis not present

## 2024-06-27 DIAGNOSIS — R591 Generalized enlarged lymph nodes: Secondary | ICD-10-CM | POA: Diagnosis not present

## 2024-06-27 MED ORDER — IOHEXOL 300 MG/ML  SOLN
100.0000 mL | Freq: Once | INTRAMUSCULAR | Status: AC | PRN
  Administered 2024-06-27: 100 mL via INTRAVENOUS

## 2024-06-28 ENCOUNTER — Inpatient Hospital Stay: Admitting: Internal Medicine

## 2024-06-28 ENCOUNTER — Ambulatory Visit
Admission: RE | Admit: 2024-06-28 | Discharge: 2024-06-28 | Disposition: A | Discharge status: Home / Self Care | Source: Ambulatory Visit | Attending: Internal Medicine | Admitting: Internal Medicine

## 2024-06-28 DIAGNOSIS — Z85841 Personal history of malignant neoplasm of brain: Secondary | ICD-10-CM | POA: Diagnosis not present

## 2024-06-28 DIAGNOSIS — G939 Disorder of brain, unspecified: Secondary | ICD-10-CM | POA: Diagnosis not present

## 2024-06-28 DIAGNOSIS — C7931 Secondary malignant neoplasm of brain: Secondary | ICD-10-CM | POA: Insufficient documentation

## 2024-06-28 MED ORDER — GADOBUTROL 1 MMOL/ML IV SOLN
7.0000 mL | Freq: Once | INTRAVENOUS | Status: AC | PRN
  Administered 2024-06-28: 7 mL via INTRAVENOUS

## 2024-07-01 ENCOUNTER — Telehealth: Payer: Self-pay | Admitting: *Deleted

## 2024-07-01 NOTE — Telephone Encounter (Signed)
 Odella called today stating that the people at work for [employer] times that she has to get out to help with her mother.  She thinks that she needs more time for the FMLA to take care of her mother.  I spoke to Dr. Melanee today and she says we are just going to see if [facility] and the patient can come tomorrow.  Odella says fine and we will go with what ever Dr. Melanee says tomorrow for FMLA-changes.  She is in her HR at work to talk about this today also

## 2024-07-02 ENCOUNTER — Ambulatory Visit (HOSPITAL_BASED_OUTPATIENT_CLINIC_OR_DEPARTMENT_OTHER): Admitting: Oncology

## 2024-07-02 ENCOUNTER — Encounter: Payer: Self-pay | Admitting: Oncology

## 2024-07-02 ENCOUNTER — Telehealth: Payer: Self-pay | Admitting: *Deleted

## 2024-07-02 VITALS — BP 152/84 | HR 71 | Temp 95.7°F | Resp 18 | Ht 66.0 in | Wt 163.5 lb

## 2024-07-02 DIAGNOSIS — C3490 Malignant neoplasm of unspecified part of unspecified bronchus or lung: Secondary | ICD-10-CM

## 2024-07-02 DIAGNOSIS — M47817 Spondylosis without myelopathy or radiculopathy, lumbosacral region: Secondary | ICD-10-CM | POA: Diagnosis not present

## 2024-07-02 DIAGNOSIS — M4316 Spondylolisthesis, lumbar region: Secondary | ICD-10-CM | POA: Diagnosis not present

## 2024-07-02 DIAGNOSIS — I10 Essential (primary) hypertension: Secondary | ICD-10-CM | POA: Diagnosis not present

## 2024-07-02 DIAGNOSIS — Z7189 Other specified counseling: Secondary | ICD-10-CM | POA: Diagnosis not present

## 2024-07-02 DIAGNOSIS — C787 Secondary malignant neoplasm of liver and intrahepatic bile duct: Secondary | ICD-10-CM | POA: Diagnosis not present

## 2024-07-02 DIAGNOSIS — R160 Hepatomegaly, not elsewhere classified: Secondary | ICD-10-CM | POA: Diagnosis not present

## 2024-07-02 DIAGNOSIS — Z923 Personal history of irradiation: Secondary | ICD-10-CM | POA: Diagnosis not present

## 2024-07-02 DIAGNOSIS — C3412 Malignant neoplasm of upper lobe, left bronchus or lung: Secondary | ICD-10-CM | POA: Diagnosis not present

## 2024-07-02 DIAGNOSIS — R590 Localized enlarged lymph nodes: Secondary | ICD-10-CM | POA: Diagnosis not present

## 2024-07-02 DIAGNOSIS — I700 Atherosclerosis of aorta: Secondary | ICD-10-CM | POA: Diagnosis not present

## 2024-07-02 DIAGNOSIS — C7931 Secondary malignant neoplasm of brain: Secondary | ICD-10-CM | POA: Diagnosis not present

## 2024-07-02 NOTE — Progress Notes (Signed)
 Hematology/Oncology Consult note [facility]  Telephone:([phone] [phone]  Patient Care Team: Epifanio Alm SQUIBB, MD as PCP - General (Infectious Diseases) Verdene Gills, RN as Oncology Nurse Navigator Melanee Annah BROCKS, MD as Consulting Physician (Oncology)   Name of the patient: Doris Johnson  [id number]  14-Apr-1951   Date of visit: 07/02/24  Diagnosis-   extensive stage small cell lung cancer with liver lymph node and brain metastases     Chief complaint/ Reason for visit-discuss CT scans and further management  Heme/Onc history:  Patient is a 73 year old female with a remote history of smoking in her teenage years and exposure to passive smoking.  She finally had a CT chest with contrast done in October 2021 which showed a large mass in the left side of the mediastinum measuring 6.7 x 6.2 x 6.1 cm causing severe compression of the left main pulmonary artery and around the aortic arch in the left subclavian artery.  Enlarged thyroid  gland.  Left upper lobe nodule 1 x 0.7 cm.  She then underwent bronchoscopy with Dr.Aleskerov left upper lobe biopsy was nondiagnostic.  However station 4, 7 and 10 L lymph nodes were consistent with metastatic small cell carcinoma.   PET CT scan showed enlarged level 3 cervical lymph node 2.2 cm that was hypermetabolic with an SUV of 9.7.  Large central 7.3 cm AP window mass.  5.7 cm left liver mass.  MRI brain with and without contrast showed 2 small foci of enhancement in the right cerebellar hemisphere and right temporal lobe without mass-effect or edema as well concerning for brain metastases   Patient had 2 cycles of carbo etoposide  chemotherapy along with Tecentriq  and subsequent scan showed no significant improvement in the primary lung mass or liver mass.  She received radiation treatment to her primary lung mass and also seen by [facility] for second opinion.  Her pathology was reviewed at [facility] and reported as  possible neuroendocrine neoplasm But stated that small cell carcinoma cannot be excluded but not definitely diagnostic for it.  However [facility] pathology feels that this is consistent with small cell lung cancer.  She has completed 4 cycles of carbo etoposide  Tecentriq  chemotherapy and is currently on maintenance Tecentriq .  Repeat liver biopsy was also performed and was consistent with small cell lung cancer   Disease progression after 23 cycles of maintenance Tecentriq  in the left supraclavicular lymph node region.  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  Patient has been on lurbinectedin  since November 2023   Scans in May 2025 showed worsening brain mets.  There was also mild increase in theThe size of left supraclavicular lymph node from 1.2 cm to 2 cm but overall stable disease elsewhere.  Patient underwent second round of whole brain radiation and is received palliative radiation to her left supraclavicular lymph node    Interval history- no acute issues since last visit. She is doing well so far  ECOG PS- 1 Pain scale- 0   Review of systems- Review of Systems  Constitutional:  Positive for malaise/fatigue. Negative for chills, fever and weight loss.  HENT:  Negative for congestion, ear discharge and nosebleeds.   Eyes:  Negative for blurred vision.  Respiratory:  Negative for cough, hemoptysis, sputum production, shortness of breath and wheezing.   Cardiovascular:  Negative for chest pain, palpitations, orthopnea and claudication.  Gastrointestinal:  Negative for abdominal pain, blood in stool, constipation, diarrhea, heartburn, melena, nausea and vomiting.  Genitourinary:  Negative for dysuria, flank pain, frequency, hematuria and urgency.  Musculoskeletal:  Negative for back pain, joint pain and myalgias.  Skin:  Negative for rash.  Neurological:  Negative for dizziness, tingling, focal weakness, seizures, weakness and headaches.   Endo/Heme/Allergies:  Does not bruise/bleed easily.  Psychiatric/Behavioral:  Negative for depression and suicidal ideas. The patient does not have insomnia.       No Known Allergies   Past Medical History:  Diagnosis Date   Cancer (HCC)    Cataract    Diabetes mellitus without complication (HCC)    Hyperlipidemia    Hypertension    Hypothyroidism    doctor's keeping an eye on thyroid     Lung cancer [facility])      Past Surgical History:  Procedure Laterality Date   ABDOMINAL HYSTERECTOMY     IR CV LINE INJECTION  09/28/2021   MYRINGOTOMY WITH TUBE PLACEMENT Right 12/29/2022   Procedure: MYRINGOTOMY WITH TUBE PLACEMENT;  Surgeon: Juengel, Paul, MD;  Location: [facility];  Service: ENT;  Laterality: Right;  Diabetic   PORTA CATH INSERTION N/A 11/30/2020   Procedure: PORTA CATH INSERTION;  Surgeon: Marea Selinda RAMAN, MD;  Location: [facility] INVASIVE CV LAB;  Service: Cardiovascular;  Laterality: N/A;   VIDEO BRONCHOSCOPY WITH ENDOBRONCHIAL NAVIGATION N/A 10/21/2020   Procedure: VIDEO BRONCHOSCOPY WITH ENDOBRONCHIAL NAVIGATION;  Surgeon: Parris Manna, MD;  Location: [facility] ORS;  Service: Thoracic;  Laterality: N/A;   VIDEO BRONCHOSCOPY WITH ENDOBRONCHIAL ULTRASOUND N/A 10/21/2020   Procedure: VIDEO BRONCHOSCOPY WITH ENDOBRONCHIAL ULTRASOUND;  Surgeon: Parris Manna, MD;  Location: [facility] ORS;  Service: Thoracic;  Laterality: N/A;    Social History   Socioeconomic History   Marital status: Single    Spouse name: Not on file   Number of children: Not on file   Years of education: Not on file   Highest education level: Not on file  Occupational History   Not on file  Tobacco Use   Smoking status: Never   Smokeless tobacco: Never  Vaping Use   Vaping status: Never Used  Substance and Sexual Activity   Alcohol use: No   Drug use: No   Sexual activity: Not Currently  Other Topics Concern   Not on file  Social History Narrative   Not on file   Social Drivers of Health    Financial Resource Strain: High Risk (02/08/2024)   Received from [facility]   Overall Financial Resource Strain (CARDIA)    Difficulty of Paying Living Expenses: Hard  Food Insecurity: No Food Insecurity (04/04/2024)   Hunger Vital Sign    Worried About Running Out of Food in the Last Year: Never true    Ran Out of Food in the Last Year: Never true  Recent Concern: Food Insecurity - Food Insecurity Present (02/08/2024)   Received from [facility]   Hunger Vital Sign    Within the past 12 months, you worried that your food would run out before you got the money to buy more.: Sometimes true    Within the past 12 months, the food you bought just didn't last and you didn't have money to get more.: Sometimes true  Transportation Needs: No Transportation Needs (04/04/2024)   PRAPARE - Administrator, Civil Service (Medical): No    Lack of Transportation (Non-Medical): No  Physical Activity: Not on file  Stress: Not on file  Social Connections: Not on file  Intimate Partner Violence: Not At Risk (04/04/2024)   Humiliation,  Afraid, Rape, and Kick questionnaire    Fear of Current or Ex-Partner: No    Emotionally Abused: No    Physically Abused: No    Sexually Abused: No    Family History  Problem Relation Age of Onset   Hypertension Mother    Breast cancer Mother 13   Heart disease Father    Hyperlipidemia Brother    Heart disease Brother      Current Outpatient Medications:    apixaban  (ELIQUIS ) 5 MG TABS tablet, Take 1 tablet (5 mg total) by mouth 2 (two) times daily., Disp: 60 tablet, Rfl: 1   fluticasone  (FLONASE ) 50 MCG/ACT nasal spray, Place 2 sprays into both nostrils daily., Disp: 16 g, Rfl: 5   furosemide  (LASIX ) 20 MG tablet, Take 20 mg by mouth., Disp: , Rfl:    lidocaine -prilocaine  (EMLA ) cream, Apply 1 Application topically as needed. Apply small amount to port site at least 1 hour prior to it being accessed, cover with  plastic wrap, Disp: 30 g, Rfl: 1   losartan (COZAAR) 25 MG tablet, Take 1 tablet by mouth daily., Disp: , Rfl:    metFORMIN (GLUCOPHAGE-XR) 500 MG 24 hr tablet, Take by mouth., Disp: , Rfl:    mirtazapine (REMERON SOL-TAB) 15 MG disintegrating tablet, Take 15 mg by mouth at bedtime., Disp: , Rfl:    potassium chloride  SA (KLOR-CON  M) 20 MEQ tablet, TAKE 1 TABLET(20 MEQ) BY MOUTH DAILY, Disp: 90 tablet, Rfl: 2   traZODone (DESYREL) 50 MG tablet, Take 50 mg by mouth at bedtime., Disp: , Rfl:  No current facility-administered medications for this visit.  Facility-Administered Medications Ordered in Other Visits:    sodium chloride  flush (NS) 0.9 % injection 10 mL, 10 mL, Intravenous, PRN, Melanee Annah BROCKS, MD, 10 mL at 12/15/20 0850   sodium chloride  flush (NS) 0.9 % injection 10 mL, 10 mL, Intravenous, PRN, Melanee Annah BROCKS, MD, 10 mL at 03/07/23 9076  Physical exam: There were no vitals filed for this visit. Physical Exam Cardiovascular:     Rate and Rhythm: Normal rate and regular rhythm.     Heart sounds: Normal heart sounds.  Pulmonary:     Effort: Pulmonary effort is normal.     Breath sounds: Normal breath sounds.  Abdominal:     General: Bowel sounds are normal.     Palpations: Abdomen is soft.  Skin:    General: Skin is warm and dry.  Neurological:     Mental Status: She is alert and oriented to person, place, and time.      I have personally reviewed labs listed below:    Latest Ref Rng & Units 06/18/2024   10:20 AM  CMP  Glucose 70 - 99 mg/dL 897   BUN 8 - 23 mg/dL 12   Creatinine 9.55 - 1.00 mg/dL 9.24   Sodium 864 - 854 mmol/L 139   Potassium 3.5 - 5.1 mmol/L 3.1   Chloride 98 - 111 mmol/L 105   CO2 22 - 32 mmol/L 26   Calcium 8.9 - 10.3 mg/dL 9.7   Total Protein 6.5 - 8.1 g/dL 7.1   Total Bilirubin 0.0 - 1.2 mg/dL 0.8   Alkaline Phos 38 - 126 U/L 37   AST 15 - 41 U/L 14   ALT 0 - 44 U/L 9       Latest Ref Rng & Units 06/18/2024   10:20 AM  CBC  WBC 4.0 - 10.5  K/uL 2.6   Hemoglobin 12.0 - 15.0  g/dL 87.3   Hematocrit 63.9 - 46.0 % 37.1   Platelets 150 - 400 K/uL 177    I have personally reviewed Radiology images listed below: No images are attached to the encounter.  MR BRAIN W WO CONTRAST Result Date: 06/28/2024 EXAM: MRI BRAIN WITH AND WITHOUT CONTRAST 06/28/2024 12:12:48 PM TECHNIQUE: Multiplanar multisequence MRI of the head/brain was performed with and without the administration of intravenous contrast. COMPARISON: MRI of the head dated 03/05/2024. CLINICAL HISTORY: Brain/CNS neoplasm, assess treatment response. Hx brain mets. SRS. Eval for changes post radiation. FINDINGS: BRAIN AND VENTRICLES: No acute infarct. No acute intracranial hemorrhage. No mass effect or midline shift. No hydrocephalus. The sella is unremarkable. Normal flow voids. 2 lesions within the right parietal lobe have increased in size in the interim. 1 of the lesions is seen on image 107 of series 16 and it has increased in size in the interim from 8 x 5 x 10 mm to 17 x 12 x 15 mm. Another lesion present laterally within the right parietal lobe on image 108 has increased in size from 7 x 5 x 5 mm to 12 x 5 mm. A lesion within the right temporal lobe seen on image 64 has also marginally increased in size in the interim from approximately 12 x 10 x 9 mm to approximately 14 x 11 x 12 mm. There are numerous other enhancing nodules scattered throughout the cerebral and cerebellar hemispheres which appears stable in the interim and are too small to accurately measure. There is increased T2 signal present throughout the cerebral white matter, particularly throughout the right parietal lobe. There are areas of blooming artifact again demonstrated within the cerebral hemispheres and right cerebellum, which are similar to the prior exam. ORBITS: No acute abnormality. SINUSES: No acute abnormality. BONES AND SOFT TISSUES: Normal bone marrow signal and enhancement. No acute soft tissue abnormality.  LIMITATIONS/ARTIFACTS: There is motion degradation on the current study. IMPRESSION: 1. Interval increase in size of 2 right parietal lobe lesions and a right temporal lobe lesion. 2. Stable numerous other enhancing nodules scattered throughout the cerebral and cerebellar hemispheres, too small to accurately measure. 3. Increased T2 signal throughout the cerebral white matter, particularly in the right parietal lobe. 4. Areas of blooming artifact within the cerebral hemispheres and right cerebellum, similar to the prior exam. 5. Motion degradation on the current study. Electronically signed by: evalene coho 06/28/2024 01:15 PM EDT RP Workstation: [id number]   CT CHEST ABDOMEN PELVIS W CONTRAST Result Date: 06/27/2024 CLINICAL DATA:  Restaging of lung cancer with liver metastasis. Small-cell primary. * Tracking Code: BO * EXAM: CT CHEST, ABDOMEN, AND PELVIS WITH CONTRAST TECHNIQUE: Multidetector CT imaging of the chest, abdomen and pelvis was performed following the standard protocol during bolus administration of intravenous contrast. RADIATION DOSE REDUCTION: This exam was performed according to the departmental dose-optimization program which includes automated exposure control, adjustment of the mA and/or kV according to patient size and/or use of iterative reconstruction technique. CONTRAST:  OMNIPAQUE  IOHEXOL  300 MG/ML  SOLN COMPARISON:  03/05/2024 FINDINGS: CT CHEST FINDINGS Cardiovascular: Right Port-A-Cath tip superior caval/atrial junction. Aortic atherosclerosis. Tortuous thoracic aorta. Mild cardiomegaly, without pericardial effusion. No central pulmonary embolism, on this non-dedicated study. Mediastinum/Nodes: Multiple thyroid  nodules, including a dominant 3.7 cm right-sided nodule again identified. Low left jugular index node measures 2.4 cm on 04/02 versus 2.0 cm on the prior exam High left paratracheal 1.0 cm node on 14/2 is increased from 8 mm on the prior (when remeasured).  No hilar  adenopathy. Lungs/Pleura: No pleural fluid. Minimal motion degradation throughout the chest. Similar appearance of presumed radiation induced architectural distortion and volume loss in the medial left upper lobe. Scattered subpleural predominant bilateral pulmonary nodules on the order of 3 mm and less are unchanged and identified on series 4. Residual central left upper lobe pulmonary nodule of 1.1 x 1.2 cm on 43/2. Compare 1.0 x 1.1 cm on the prior exam when remeasured in a similar fashion. Vague left apical 7 mm nodule on 33/4 is more distinct than at 6 mm on the prior. Musculoskeletal: Included within the abdomen pelvic section. CT ABDOMEN PELVIS FINDINGS Hepatobiliary: Vague anterior segment 4A hypoattenuation including on 44/2 is likely focal steatosis. Just posteroinferior to this, a hypoattenuating 2.5 x 1.9 cm lesion on 45/2 is similar to 2.6 x 2.1 cm on the prior (when remeasured). No new liver lesion. Normal gallbladder, without biliary ductal dilatation. Pancreas: Normal, without mass or ductal dilatation. Spleen: Normal in size, without focal abnormality. Adrenals/Urinary Tract: Normal adrenal glands. Normal kidneys, without hydronephrosis. Decompressed urinary bladder. Stomach/Bowel: Gastric antral underdistention. Colonic stool burden suggests constipation. Normal terminal ileum and appendix. Normal small bowel. Vascular/Lymphatic: Aortic atherosclerosis. No abdominopelvic adenopathy. Reproductive: Hysterectomy.  No adnexal mass. Other: No significant free fluid. No evidence of omental or peritoneal disease. Musculoskeletal: Grade 1 L4-5 anterolisthesis. Lumbosacral spondylosis. IMPRESSION: 1. Progressive adenopathy within the low left neck and developing adenopathy within the high mediastinum, most consistent with progressive disease. 2. Radiation induced fibrosis within the left upper lobe. Areas of residual and developing left upper lobe nodularity, suspicious for local recurrence/pulmonary  metastasis. 3. No active metastasis within the abdomen or pelvis. Similar size of a left hepatic lobe lesion which is likely a treated metastasis. 4.  Aortic Atherosclerosis (ICD10-I70.0). 5. Dominant right-sided thyroid  mass which is of questionable clinical significance given comorbidities. Recommend attention on follow-up. Electronically Signed   By: Rockey Kilts M.D.   On: 06/27/2024 11:30     Assessment and plan- Patient is a 73 y.o. female with extensive stage small cell lung cancer with liver, lymph node and brain metastases.  She is here to discuss further management of lung cancer  I have CT chest abdomen and pelvis images independently as well as MRI brain images independently and discussed findings with the patient.  Patient has disease progression on lurbinectedin  given that there is further progression of adenopathy in her lower left neck extending into the high mediastinum.  She has also received palliative radiation to this area but despite that these lymph nodes seem to be growing.  No evidence of progression elsewhere.  I offered following options for treatment moving forward:  See Dr. Annell at [facility] for consideration of tarlatamab treatment.  These treatments can be associated with cytokine release syndrome and therefore she will need to get at least the first couple of treatments at [facility].  Subsequently depending on what patient wants she can either continue treatments at [facility] versus come back for her care which will have to be at [facility].   Option 2 would be to not pursue tarlatamab but proceed with single agent topotecan instead.  However Stevens Number Mab has been compared to chemotherapy and has been found to be superior in terms of overall survival. In a randomized trial in 509 patients with progression on or after platinum-based chemotherapy (approximately 70 percent of whom also had prior exposure to an immune checkpoint inhibitor), tarlatamab improved overall survival (OS)  relative to chemotherapy (topotecan, lurbinectedin ,  or amrubicin), with a median OS of 13.6 versus 8.3 months (HR 0.60, 95% CI 0.447-0.77)  Patient understands and is willing to go to [facility] at this time  I also discussed the results of MRI brain which was done after completing second round of whole brain radiation which shows mild increase in the size of 2 parietal lobe lesions and right temporal lobe lesion but other nodules appear stable.  Unfortunately she does not have any meaningful radiation options at this time but this can be discussed further at [facility] as well.  F/u to be decided based on her meeting with Dr. Annell   Visit Diagnosis 1. Goals of care, counseling/discussion   2. Small cell carcinoma of lung metastatic to liver [facility])      Dr. Annah Skene, MD, MPH [facility] [phone] 07/02/2024 9:48 AM

## 2024-07-02 NOTE — Telephone Encounter (Signed)
 Follow up appt scheduled with Dr. Annell at [facility] to further discuss tarlatamab treatment. Appt scheduled for 9/4 at 11am and pt's daughter, Odella, made aware.   During call with Odella, she asked if Dr. Melanee would be willing to extend her FMLA to care for pt while she is waiting for her follow up appt at [facility]. Informed Odella that will check with Dr. Melanee and let her know.

## 2024-07-02 NOTE — Progress Notes (Signed)
 Voice message left for daughter Odella Moose [phone] indicating that the letter that was requested was provided to her father as requested.

## 2024-07-03 ENCOUNTER — Telehealth: Payer: Self-pay | Admitting: Internal Medicine

## 2024-07-03 ENCOUNTER — Encounter: Payer: Self-pay | Admitting: Oncology

## 2024-07-03 NOTE — Telephone Encounter (Signed)
 Doris Johnson made aware that Dr. Melanee will approve extension of her FMLA to provide care for pt during this time. Informed her to bring FMLA paperwork to our clinic or have them faxed. Doris Johnson verbalized understanding.

## 2024-07-03 NOTE — Telephone Encounter (Signed)
 Yes ok

## 2024-07-03 NOTE — Telephone Encounter (Signed)
 Called and lvm with daughter letting her know about the change in appts. Her mom is now scheduled for 9/5 at 1030. I asked her to call me back if this did not work

## 2024-07-04 ENCOUNTER — Ambulatory Visit: Attending: Radiation Oncology | Admitting: Radiation Oncology

## 2024-07-04 DIAGNOSIS — C3490 Malignant neoplasm of unspecified part of unspecified bronchus or lung: Secondary | ICD-10-CM | POA: Insufficient documentation

## 2024-07-04 DIAGNOSIS — C787 Secondary malignant neoplasm of liver and intrahepatic bile duct: Secondary | ICD-10-CM | POA: Insufficient documentation

## 2024-07-04 DIAGNOSIS — C7931 Secondary malignant neoplasm of brain: Secondary | ICD-10-CM | POA: Insufficient documentation

## 2024-07-05 ENCOUNTER — Ambulatory Visit: Admitting: Oncology

## 2024-07-08 ENCOUNTER — Telehealth: Payer: Self-pay | Admitting: Internal Medicine

## 2024-07-08 NOTE — Telephone Encounter (Signed)
 Pt daughter called to cancel Dr.Vaslow appt on 9/5. Stated she doesn't need the appt. And pt is going to start at [facility]. Appt has been canceled.

## 2024-07-12 ENCOUNTER — Ambulatory Visit: Admitting: Internal Medicine

## 2024-07-18 DIAGNOSIS — C3400 Malignant neoplasm of unspecified main bronchus: Secondary | ICD-10-CM | POA: Diagnosis not present

## 2024-07-18 DIAGNOSIS — C7931 Secondary malignant neoplasm of brain: Secondary | ICD-10-CM | POA: Diagnosis not present

## 2024-07-19 ENCOUNTER — Ambulatory Visit: Admitting: Internal Medicine

## 2024-07-22 ENCOUNTER — Ambulatory Visit: Admitting: Oncology

## 2024-07-29 DIAGNOSIS — G936 Cerebral edema: Secondary | ICD-10-CM | POA: Diagnosis not present

## 2024-07-29 DIAGNOSIS — T451X5A Adverse effect of antineoplastic and immunosuppressive drugs, initial encounter: Secondary | ICD-10-CM | POA: Diagnosis not present

## 2024-07-29 DIAGNOSIS — Z5111 Encounter for antineoplastic chemotherapy: Secondary | ICD-10-CM | POA: Diagnosis not present

## 2024-07-29 DIAGNOSIS — I619 Nontraumatic intracerebral hemorrhage, unspecified: Secondary | ICD-10-CM | POA: Diagnosis not present

## 2024-07-29 DIAGNOSIS — E785 Hyperlipidemia, unspecified: Secondary | ICD-10-CM | POA: Diagnosis not present

## 2024-07-29 DIAGNOSIS — E876 Hypokalemia: Secondary | ICD-10-CM | POA: Diagnosis not present

## 2024-07-29 DIAGNOSIS — C3400 Malignant neoplasm of unspecified main bronchus: Secondary | ICD-10-CM | POA: Diagnosis not present

## 2024-07-29 DIAGNOSIS — C7931 Secondary malignant neoplasm of brain: Secondary | ICD-10-CM | POA: Diagnosis not present

## 2024-07-29 DIAGNOSIS — J309 Allergic rhinitis, unspecified: Secondary | ICD-10-CM | POA: Diagnosis not present

## 2024-07-29 DIAGNOSIS — Z5112 Encounter for antineoplastic immunotherapy: Secondary | ICD-10-CM | POA: Diagnosis not present

## 2024-07-29 DIAGNOSIS — C3490 Malignant neoplasm of unspecified part of unspecified bronchus or lung: Secondary | ICD-10-CM | POA: Diagnosis not present

## 2024-07-29 DIAGNOSIS — I10 Essential (primary) hypertension: Secondary | ICD-10-CM | POA: Diagnosis not present

## 2024-07-29 DIAGNOSIS — E119 Type 2 diabetes mellitus without complications: Secondary | ICD-10-CM | POA: Diagnosis not present

## 2024-07-30 DIAGNOSIS — C7931 Secondary malignant neoplasm of brain: Secondary | ICD-10-CM | POA: Diagnosis not present

## 2024-07-30 DIAGNOSIS — T451X5A Adverse effect of antineoplastic and immunosuppressive drugs, initial encounter: Secondary | ICD-10-CM | POA: Diagnosis not present

## 2024-07-30 DIAGNOSIS — I619 Nontraumatic intracerebral hemorrhage, unspecified: Secondary | ICD-10-CM | POA: Diagnosis not present

## 2024-07-30 DIAGNOSIS — C3400 Malignant neoplasm of unspecified main bronchus: Secondary | ICD-10-CM | POA: Diagnosis not present

## 2024-07-31 DIAGNOSIS — T451X5A Adverse effect of antineoplastic and immunosuppressive drugs, initial encounter: Secondary | ICD-10-CM | POA: Diagnosis not present

## 2024-07-31 DIAGNOSIS — C3400 Malignant neoplasm of unspecified main bronchus: Secondary | ICD-10-CM | POA: Diagnosis not present

## 2024-07-31 DIAGNOSIS — I619 Nontraumatic intracerebral hemorrhage, unspecified: Secondary | ICD-10-CM | POA: Diagnosis not present

## 2024-07-31 DIAGNOSIS — C7931 Secondary malignant neoplasm of brain: Secondary | ICD-10-CM | POA: Diagnosis not present

## 2024-08-01 DIAGNOSIS — C7931 Secondary malignant neoplasm of brain: Secondary | ICD-10-CM | POA: Diagnosis not present

## 2024-08-01 DIAGNOSIS — C3400 Malignant neoplasm of unspecified main bronchus: Secondary | ICD-10-CM | POA: Diagnosis not present

## 2024-08-01 DIAGNOSIS — I619 Nontraumatic intracerebral hemorrhage, unspecified: Secondary | ICD-10-CM | POA: Diagnosis not present

## 2024-08-01 DIAGNOSIS — T451X5A Adverse effect of antineoplastic and immunosuppressive drugs, initial encounter: Secondary | ICD-10-CM | POA: Diagnosis not present

## 2024-08-05 DIAGNOSIS — E559 Vitamin D deficiency, unspecified: Secondary | ICD-10-CM | POA: Diagnosis not present

## 2024-08-05 DIAGNOSIS — E119 Type 2 diabetes mellitus without complications: Secondary | ICD-10-CM | POA: Diagnosis not present

## 2024-08-05 DIAGNOSIS — J309 Allergic rhinitis, unspecified: Secondary | ICD-10-CM | POA: Diagnosis not present

## 2024-08-05 DIAGNOSIS — Z86718 Personal history of other venous thrombosis and embolism: Secondary | ICD-10-CM | POA: Diagnosis not present

## 2024-08-05 DIAGNOSIS — E876 Hypokalemia: Secondary | ICD-10-CM | POA: Diagnosis not present

## 2024-08-05 DIAGNOSIS — G936 Cerebral edema: Secondary | ICD-10-CM | POA: Diagnosis not present

## 2024-08-05 DIAGNOSIS — E785 Hyperlipidemia, unspecified: Secondary | ICD-10-CM | POA: Diagnosis not present

## 2024-08-05 DIAGNOSIS — C3490 Malignant neoplasm of unspecified part of unspecified bronchus or lung: Secondary | ICD-10-CM | POA: Diagnosis not present

## 2024-08-05 DIAGNOSIS — C3400 Malignant neoplasm of unspecified main bronchus: Secondary | ICD-10-CM | POA: Diagnosis not present

## 2024-08-05 DIAGNOSIS — C7931 Secondary malignant neoplasm of brain: Secondary | ICD-10-CM | POA: Diagnosis not present

## 2024-08-05 DIAGNOSIS — I10 Essential (primary) hypertension: Secondary | ICD-10-CM | POA: Diagnosis not present

## 2024-08-06 DIAGNOSIS — J309 Allergic rhinitis, unspecified: Secondary | ICD-10-CM | POA: Diagnosis not present

## 2024-08-06 DIAGNOSIS — E119 Type 2 diabetes mellitus without complications: Secondary | ICD-10-CM | POA: Diagnosis not present

## 2024-08-06 DIAGNOSIS — E785 Hyperlipidemia, unspecified: Secondary | ICD-10-CM | POA: Diagnosis not present

## 2024-08-06 DIAGNOSIS — I10 Essential (primary) hypertension: Secondary | ICD-10-CM | POA: Diagnosis not present

## 2024-08-06 DIAGNOSIS — C3490 Malignant neoplasm of unspecified part of unspecified bronchus or lung: Secondary | ICD-10-CM | POA: Diagnosis not present

## 2024-08-06 DIAGNOSIS — E559 Vitamin D deficiency, unspecified: Secondary | ICD-10-CM | POA: Diagnosis not present

## 2024-08-06 DIAGNOSIS — C7931 Secondary malignant neoplasm of brain: Secondary | ICD-10-CM | POA: Diagnosis not present

## 2024-08-06 DIAGNOSIS — Z86718 Personal history of other venous thrombosis and embolism: Secondary | ICD-10-CM | POA: Diagnosis not present

## 2024-08-06 DIAGNOSIS — E876 Hypokalemia: Secondary | ICD-10-CM | POA: Diagnosis not present

## 2024-08-07 ENCOUNTER — Telehealth: Payer: Self-pay | Admitting: *Deleted

## 2024-08-07 NOTE — Telephone Encounter (Signed)
 That's unfortunate. She is such a sweet lady.

## 2024-08-07 NOTE — Telephone Encounter (Signed)
 Received call from pt's brother, Zachary, stating that pt has received 2 infusions of tarlatamab at [facility]. Since starting treatments though, pt's brother has noticed that pt's mental capacity has been declining and pt has become very forgetful. He wanted to let Dr. Melanee know of pt's condition at this time and that they are trying to find in-home care or placement to help care for patient. They are still planning on continuing her treatments at this time.

## 2024-08-08 ENCOUNTER — Inpatient Hospital Stay: Attending: Oncology

## 2024-08-08 ENCOUNTER — Telehealth: Payer: Self-pay

## 2024-08-08 NOTE — Progress Notes (Signed)
 [facility] Clinical Social Work  Clinical Social Work was referred by Statistician for advance directives questions.  Clinical Social Worker contacted caregiver by phone to offer support and assess for needs.     Interventions: Provided education and assistance to client regarding Advanced Directives. Spoke with patient's brother, Zachary.  He will arrange for patient to come to [facility] to complete her advance directives.      Follow Up Plan:  Patient will contact CSW with any support or resource needs    Macario CHRISTELLA Au, LCSW  Clinical Social Worker [facility]

## 2024-08-08 NOTE — Telephone Encounter (Signed)
 Clinical Social Work was referred by Scientist, forensic.  CSW attempted to contact her brother, Zachary, by phone.  Left voicemail with contact information and request for return call.

## 2024-08-12 DIAGNOSIS — C7A1 Malignant poorly differentiated neuroendocrine tumors: Secondary | ICD-10-CM | POA: Diagnosis not present

## 2024-08-12 DIAGNOSIS — I10 Essential (primary) hypertension: Secondary | ICD-10-CM | POA: Diagnosis not present

## 2024-08-12 DIAGNOSIS — C719 Malignant neoplasm of brain, unspecified: Secondary | ICD-10-CM | POA: Diagnosis not present

## 2024-08-12 DIAGNOSIS — R413 Other amnesia: Secondary | ICD-10-CM | POA: Diagnosis not present

## 2024-08-12 DIAGNOSIS — Z79899 Other long term (current) drug therapy: Secondary | ICD-10-CM | POA: Diagnosis not present

## 2024-08-12 DIAGNOSIS — G936 Cerebral edema: Secondary | ICD-10-CM | POA: Diagnosis not present

## 2024-08-12 DIAGNOSIS — C3400 Malignant neoplasm of unspecified main bronchus: Secondary | ICD-10-CM | POA: Diagnosis not present

## 2024-08-12 DIAGNOSIS — Z5111 Encounter for antineoplastic chemotherapy: Secondary | ICD-10-CM | POA: Diagnosis not present

## 2024-08-12 DIAGNOSIS — C7931 Secondary malignant neoplasm of brain: Secondary | ICD-10-CM | POA: Diagnosis not present

## 2024-08-12 DIAGNOSIS — R4189 Other symptoms and signs involving cognitive functions and awareness: Secondary | ICD-10-CM | POA: Diagnosis not present

## 2024-08-13 ENCOUNTER — Inpatient Hospital Stay

## 2024-08-13 DIAGNOSIS — Z5112 Encounter for antineoplastic immunotherapy: Secondary | ICD-10-CM | POA: Diagnosis not present

## 2024-08-13 DIAGNOSIS — Z79899 Other long term (current) drug therapy: Secondary | ICD-10-CM | POA: Diagnosis not present

## 2024-08-13 DIAGNOSIS — C7B8 Other secondary neuroendocrine tumors: Secondary | ICD-10-CM | POA: Diagnosis not present

## 2024-08-13 DIAGNOSIS — C7A8 Other malignant neuroendocrine tumors: Secondary | ICD-10-CM | POA: Diagnosis not present

## 2024-08-13 NOTE — Progress Notes (Signed)
 [facility] CSW Progress Note  Clinical Child psychotherapist received phone call from patient's brother, Zachary, to follow-up on advance directives questions.    Interventions: He would like to bring patient in to the cancer center on Friday to complete her advance directives.  Informed him that volunteers may not be available as witnesses.  He stated he understood.       Follow Up Plan:  CSW will see patient on Friday, Oct. 3, at 11:00am.    Macario CHRISTELLA Au, LCSW Clinical Social Worker [facility]

## 2024-08-16 ENCOUNTER — Inpatient Hospital Stay: Attending: Oncology

## 2024-08-16 DIAGNOSIS — C3490 Malignant neoplasm of unspecified part of unspecified bronchus or lung: Secondary | ICD-10-CM | POA: Diagnosis not present

## 2024-08-16 DIAGNOSIS — C7931 Secondary malignant neoplasm of brain: Secondary | ICD-10-CM | POA: Diagnosis not present

## 2024-08-16 DIAGNOSIS — Z23 Encounter for immunization: Secondary | ICD-10-CM | POA: Diagnosis not present

## 2024-08-16 DIAGNOSIS — E119 Type 2 diabetes mellitus without complications: Secondary | ICD-10-CM | POA: Diagnosis not present

## 2024-08-16 DIAGNOSIS — I10 Essential (primary) hypertension: Secondary | ICD-10-CM | POA: Diagnosis not present

## 2024-08-16 NOTE — Progress Notes (Signed)
 [facility] Healthcare Advance Directives Clinical Social Work  Patient presented to Advance Directives Clinic  to review and complete healthcare advance directives.  Clinical Social Worker met with patient, her daughter, and her brother.  The patient designated Odella Moose as their primary healthcare agent and George Packenham as their secondary agent.  Patient also completed healthcare living will.    Documents were notarized and copies made for patient/family. Clinical Social Worker will send documents to medical records to be scanned into patient's chart. Clinical Social Worker encouraged patient/family to contact with any additional questions or concerns.   Macario CHRISTELLA Au, LCSW Clinical Social Worker [facility]

## 2024-08-21 DIAGNOSIS — Z87891 Personal history of nicotine dependence: Secondary | ICD-10-CM | POA: Diagnosis not present

## 2024-08-21 DIAGNOSIS — K5900 Constipation, unspecified: Secondary | ICD-10-CM | POA: Diagnosis not present

## 2024-08-21 DIAGNOSIS — K5790 Diverticulosis of intestine, part unspecified, without perforation or abscess without bleeding: Secondary | ICD-10-CM | POA: Diagnosis not present

## 2024-08-21 DIAGNOSIS — K219 Gastro-esophageal reflux disease without esophagitis: Secondary | ICD-10-CM | POA: Diagnosis not present

## 2024-08-21 DIAGNOSIS — Z86011 Personal history of benign neoplasm of the brain: Secondary | ICD-10-CM | POA: Diagnosis not present

## 2024-08-21 DIAGNOSIS — I10 Essential (primary) hypertension: Secondary | ICD-10-CM | POA: Diagnosis not present

## 2024-08-21 DIAGNOSIS — E119 Type 2 diabetes mellitus without complications: Secondary | ICD-10-CM | POA: Diagnosis not present

## 2024-08-21 DIAGNOSIS — R634 Abnormal weight loss: Secondary | ICD-10-CM | POA: Diagnosis not present

## 2024-08-21 DIAGNOSIS — E7800 Pure hypercholesterolemia, unspecified: Secondary | ICD-10-CM | POA: Diagnosis not present

## 2024-08-22 DIAGNOSIS — K219 Gastro-esophageal reflux disease without esophagitis: Secondary | ICD-10-CM | POA: Diagnosis not present

## 2024-08-22 DIAGNOSIS — E7800 Pure hypercholesterolemia, unspecified: Secondary | ICD-10-CM | POA: Diagnosis not present

## 2024-08-22 DIAGNOSIS — E119 Type 2 diabetes mellitus without complications: Secondary | ICD-10-CM | POA: Diagnosis not present

## 2024-08-22 DIAGNOSIS — Z86011 Personal history of benign neoplasm of the brain: Secondary | ICD-10-CM | POA: Diagnosis not present

## 2024-08-22 DIAGNOSIS — K5900 Constipation, unspecified: Secondary | ICD-10-CM | POA: Diagnosis not present

## 2024-08-22 DIAGNOSIS — Z87891 Personal history of nicotine dependence: Secondary | ICD-10-CM | POA: Diagnosis not present

## 2024-08-22 DIAGNOSIS — K5790 Diverticulosis of intestine, part unspecified, without perforation or abscess without bleeding: Secondary | ICD-10-CM | POA: Diagnosis not present

## 2024-08-22 DIAGNOSIS — I10 Essential (primary) hypertension: Secondary | ICD-10-CM | POA: Diagnosis not present

## 2024-08-22 DIAGNOSIS — R634 Abnormal weight loss: Secondary | ICD-10-CM | POA: Diagnosis not present

## 2024-08-23 ENCOUNTER — Encounter: Payer: Self-pay | Admitting: Oncology

## 2024-08-23 ENCOUNTER — Other Ambulatory Visit: Payer: Self-pay | Admitting: Pharmacist

## 2024-08-26 DIAGNOSIS — R413 Other amnesia: Secondary | ICD-10-CM | POA: Diagnosis not present

## 2024-08-26 DIAGNOSIS — R5381 Other malaise: Secondary | ICD-10-CM | POA: Diagnosis not present

## 2024-08-26 DIAGNOSIS — C3400 Malignant neoplasm of unspecified main bronchus: Secondary | ICD-10-CM | POA: Diagnosis not present

## 2024-08-26 DIAGNOSIS — R630 Anorexia: Secondary | ICD-10-CM | POA: Diagnosis not present

## 2024-08-26 DIAGNOSIS — Z5112 Encounter for antineoplastic immunotherapy: Secondary | ICD-10-CM | POA: Diagnosis not present

## 2024-08-26 DIAGNOSIS — Z6822 Body mass index (BMI) 22.0-22.9, adult: Secondary | ICD-10-CM | POA: Diagnosis not present

## 2024-08-26 DIAGNOSIS — C7B8 Other secondary neuroendocrine tumors: Secondary | ICD-10-CM | POA: Diagnosis not present

## 2024-08-26 DIAGNOSIS — C801 Malignant (primary) neoplasm, unspecified: Secondary | ICD-10-CM | POA: Diagnosis not present

## 2024-08-26 DIAGNOSIS — C7A1 Malignant poorly differentiated neuroendocrine tumors: Secondary | ICD-10-CM | POA: Diagnosis not present

## 2024-08-26 DIAGNOSIS — Z87891 Personal history of nicotine dependence: Secondary | ICD-10-CM | POA: Diagnosis not present

## 2024-08-26 DIAGNOSIS — E876 Hypokalemia: Secondary | ICD-10-CM | POA: Diagnosis not present

## 2024-08-26 DIAGNOSIS — C7931 Secondary malignant neoplasm of brain: Secondary | ICD-10-CM | POA: Diagnosis not present

## 2024-08-27 DIAGNOSIS — E119 Type 2 diabetes mellitus without complications: Secondary | ICD-10-CM | POA: Diagnosis not present

## 2024-08-27 DIAGNOSIS — K219 Gastro-esophageal reflux disease without esophagitis: Secondary | ICD-10-CM | POA: Diagnosis not present

## 2024-08-27 DIAGNOSIS — I10 Essential (primary) hypertension: Secondary | ICD-10-CM | POA: Diagnosis not present

## 2024-08-27 DIAGNOSIS — Z86011 Personal history of benign neoplasm of the brain: Secondary | ICD-10-CM | POA: Diagnosis not present

## 2024-08-27 DIAGNOSIS — K5900 Constipation, unspecified: Secondary | ICD-10-CM | POA: Diagnosis not present

## 2024-08-27 DIAGNOSIS — K5790 Diverticulosis of intestine, part unspecified, without perforation or abscess without bleeding: Secondary | ICD-10-CM | POA: Diagnosis not present

## 2024-08-27 DIAGNOSIS — R634 Abnormal weight loss: Secondary | ICD-10-CM | POA: Diagnosis not present

## 2024-08-27 DIAGNOSIS — E7800 Pure hypercholesterolemia, unspecified: Secondary | ICD-10-CM | POA: Diagnosis not present

## 2024-08-27 DIAGNOSIS — Z87891 Personal history of nicotine dependence: Secondary | ICD-10-CM | POA: Diagnosis not present

## 2024-08-28 DIAGNOSIS — R634 Abnormal weight loss: Secondary | ICD-10-CM | POA: Diagnosis not present

## 2024-08-28 DIAGNOSIS — I10 Essential (primary) hypertension: Secondary | ICD-10-CM | POA: Diagnosis not present

## 2024-08-28 DIAGNOSIS — E7800 Pure hypercholesterolemia, unspecified: Secondary | ICD-10-CM | POA: Diagnosis not present

## 2024-08-28 DIAGNOSIS — Z86011 Personal history of benign neoplasm of the brain: Secondary | ICD-10-CM | POA: Diagnosis not present

## 2024-08-28 DIAGNOSIS — E119 Type 2 diabetes mellitus without complications: Secondary | ICD-10-CM | POA: Diagnosis not present

## 2024-08-28 DIAGNOSIS — K5900 Constipation, unspecified: Secondary | ICD-10-CM | POA: Diagnosis not present

## 2024-08-28 DIAGNOSIS — Z87891 Personal history of nicotine dependence: Secondary | ICD-10-CM | POA: Diagnosis not present

## 2024-08-28 DIAGNOSIS — K5790 Diverticulosis of intestine, part unspecified, without perforation or abscess without bleeding: Secondary | ICD-10-CM | POA: Diagnosis not present

## 2024-08-28 DIAGNOSIS — K219 Gastro-esophageal reflux disease without esophagitis: Secondary | ICD-10-CM | POA: Diagnosis not present

## 2024-09-02 DIAGNOSIS — R634 Abnormal weight loss: Secondary | ICD-10-CM | POA: Diagnosis not present

## 2024-09-02 DIAGNOSIS — K5790 Diverticulosis of intestine, part unspecified, without perforation or abscess without bleeding: Secondary | ICD-10-CM | POA: Diagnosis not present

## 2024-09-02 DIAGNOSIS — K219 Gastro-esophageal reflux disease without esophagitis: Secondary | ICD-10-CM | POA: Diagnosis not present

## 2024-09-02 DIAGNOSIS — K5900 Constipation, unspecified: Secondary | ICD-10-CM | POA: Diagnosis not present

## 2024-09-02 DIAGNOSIS — Z87891 Personal history of nicotine dependence: Secondary | ICD-10-CM | POA: Diagnosis not present

## 2024-09-02 DIAGNOSIS — E119 Type 2 diabetes mellitus without complications: Secondary | ICD-10-CM | POA: Diagnosis not present

## 2024-09-02 DIAGNOSIS — Z86011 Personal history of benign neoplasm of the brain: Secondary | ICD-10-CM | POA: Diagnosis not present

## 2024-09-02 DIAGNOSIS — I10 Essential (primary) hypertension: Secondary | ICD-10-CM | POA: Diagnosis not present

## 2024-09-02 DIAGNOSIS — E7800 Pure hypercholesterolemia, unspecified: Secondary | ICD-10-CM | POA: Diagnosis not present

## 2024-09-03 DIAGNOSIS — E119 Type 2 diabetes mellitus without complications: Secondary | ICD-10-CM | POA: Diagnosis not present

## 2024-09-03 DIAGNOSIS — K219 Gastro-esophageal reflux disease without esophagitis: Secondary | ICD-10-CM | POA: Diagnosis not present

## 2024-09-03 DIAGNOSIS — K5790 Diverticulosis of intestine, part unspecified, without perforation or abscess without bleeding: Secondary | ICD-10-CM | POA: Diagnosis not present

## 2024-09-03 DIAGNOSIS — Z86011 Personal history of benign neoplasm of the brain: Secondary | ICD-10-CM | POA: Diagnosis not present

## 2024-09-03 DIAGNOSIS — Z87891 Personal history of nicotine dependence: Secondary | ICD-10-CM | POA: Diagnosis not present

## 2024-09-03 DIAGNOSIS — R634 Abnormal weight loss: Secondary | ICD-10-CM | POA: Diagnosis not present

## 2024-09-03 DIAGNOSIS — E7800 Pure hypercholesterolemia, unspecified: Secondary | ICD-10-CM | POA: Diagnosis not present

## 2024-09-03 DIAGNOSIS — K5900 Constipation, unspecified: Secondary | ICD-10-CM | POA: Diagnosis not present

## 2024-09-09 DIAGNOSIS — C719 Malignant neoplasm of brain, unspecified: Secondary | ICD-10-CM | POA: Diagnosis not present

## 2024-09-09 DIAGNOSIS — E860 Dehydration: Secondary | ICD-10-CM | POA: Diagnosis not present

## 2024-09-09 DIAGNOSIS — Z5112 Encounter for antineoplastic immunotherapy: Secondary | ICD-10-CM | POA: Diagnosis not present

## 2024-09-09 DIAGNOSIS — R413 Other amnesia: Secondary | ICD-10-CM | POA: Diagnosis not present

## 2024-09-09 DIAGNOSIS — C3400 Malignant neoplasm of unspecified main bronchus: Secondary | ICD-10-CM | POA: Diagnosis not present

## 2024-09-09 DIAGNOSIS — C7A1 Malignant poorly differentiated neuroendocrine tumors: Secondary | ICD-10-CM | POA: Diagnosis not present

## 2024-09-09 DIAGNOSIS — R630 Anorexia: Secondary | ICD-10-CM | POA: Diagnosis not present

## 2024-09-09 DIAGNOSIS — C7B8 Other secondary neuroendocrine tumors: Secondary | ICD-10-CM | POA: Diagnosis not present

## 2024-09-09 DIAGNOSIS — C7931 Secondary malignant neoplasm of brain: Secondary | ICD-10-CM | POA: Diagnosis not present

## 2024-09-09 DIAGNOSIS — Z87891 Personal history of nicotine dependence: Secondary | ICD-10-CM | POA: Diagnosis not present

## 2024-09-09 DIAGNOSIS — Y842 Radiological procedure and radiotherapy as the cause of abnormal reaction of the patient, or of later complication, without mention of misadventure at the time of the procedure: Secondary | ICD-10-CM | POA: Diagnosis not present

## 2024-09-09 DIAGNOSIS — R4189 Other symptoms and signs involving cognitive functions and awareness: Secondary | ICD-10-CM | POA: Diagnosis not present

## 2024-09-09 DIAGNOSIS — R5381 Other malaise: Secondary | ICD-10-CM | POA: Diagnosis not present

## 2024-09-11 DIAGNOSIS — K5900 Constipation, unspecified: Secondary | ICD-10-CM | POA: Diagnosis not present

## 2024-09-11 DIAGNOSIS — E119 Type 2 diabetes mellitus without complications: Secondary | ICD-10-CM | POA: Diagnosis not present

## 2024-09-11 DIAGNOSIS — I10 Essential (primary) hypertension: Secondary | ICD-10-CM | POA: Diagnosis not present

## 2024-09-11 DIAGNOSIS — K5790 Diverticulosis of intestine, part unspecified, without perforation or abscess without bleeding: Secondary | ICD-10-CM | POA: Diagnosis not present

## 2024-09-11 DIAGNOSIS — R634 Abnormal weight loss: Secondary | ICD-10-CM | POA: Diagnosis not present

## 2024-09-11 DIAGNOSIS — Z87891 Personal history of nicotine dependence: Secondary | ICD-10-CM | POA: Diagnosis not present

## 2024-09-11 DIAGNOSIS — K219 Gastro-esophageal reflux disease without esophagitis: Secondary | ICD-10-CM | POA: Diagnosis not present

## 2024-09-11 DIAGNOSIS — E7800 Pure hypercholesterolemia, unspecified: Secondary | ICD-10-CM | POA: Diagnosis not present

## 2024-09-11 DIAGNOSIS — Z86011 Personal history of benign neoplasm of the brain: Secondary | ICD-10-CM | POA: Diagnosis not present

## 2024-09-12 DIAGNOSIS — I10 Essential (primary) hypertension: Secondary | ICD-10-CM | POA: Diagnosis not present

## 2024-09-12 DIAGNOSIS — K219 Gastro-esophageal reflux disease without esophagitis: Secondary | ICD-10-CM | POA: Diagnosis not present

## 2024-09-12 DIAGNOSIS — E119 Type 2 diabetes mellitus without complications: Secondary | ICD-10-CM | POA: Diagnosis not present

## 2024-09-12 DIAGNOSIS — E7800 Pure hypercholesterolemia, unspecified: Secondary | ICD-10-CM | POA: Diagnosis not present

## 2024-09-12 DIAGNOSIS — Z87891 Personal history of nicotine dependence: Secondary | ICD-10-CM | POA: Diagnosis not present

## 2024-09-12 DIAGNOSIS — K5790 Diverticulosis of intestine, part unspecified, without perforation or abscess without bleeding: Secondary | ICD-10-CM | POA: Diagnosis not present

## 2024-09-12 DIAGNOSIS — R634 Abnormal weight loss: Secondary | ICD-10-CM | POA: Diagnosis not present

## 2024-09-12 DIAGNOSIS — K5900 Constipation, unspecified: Secondary | ICD-10-CM | POA: Diagnosis not present

## 2024-09-12 DIAGNOSIS — Z86011 Personal history of benign neoplasm of the brain: Secondary | ICD-10-CM | POA: Diagnosis not present

## 2024-09-16 DIAGNOSIS — Z87891 Personal history of nicotine dependence: Secondary | ICD-10-CM | POA: Diagnosis not present

## 2024-09-16 DIAGNOSIS — Z86011 Personal history of benign neoplasm of the brain: Secondary | ICD-10-CM | POA: Diagnosis not present

## 2024-09-16 DIAGNOSIS — E7800 Pure hypercholesterolemia, unspecified: Secondary | ICD-10-CM | POA: Diagnosis not present

## 2024-09-16 DIAGNOSIS — E119 Type 2 diabetes mellitus without complications: Secondary | ICD-10-CM | POA: Diagnosis not present

## 2024-09-16 DIAGNOSIS — K5900 Constipation, unspecified: Secondary | ICD-10-CM | POA: Diagnosis not present

## 2024-09-16 DIAGNOSIS — K219 Gastro-esophageal reflux disease without esophagitis: Secondary | ICD-10-CM | POA: Diagnosis not present

## 2024-09-16 DIAGNOSIS — K5790 Diverticulosis of intestine, part unspecified, without perforation or abscess without bleeding: Secondary | ICD-10-CM | POA: Diagnosis not present

## 2024-09-16 DIAGNOSIS — R634 Abnormal weight loss: Secondary | ICD-10-CM | POA: Diagnosis not present

## 2024-09-22 DIAGNOSIS — C7931 Secondary malignant neoplasm of brain: Secondary | ICD-10-CM | POA: Diagnosis not present

## 2024-09-22 DIAGNOSIS — C3490 Malignant neoplasm of unspecified part of unspecified bronchus or lung: Secondary | ICD-10-CM | POA: Diagnosis not present

## 2024-09-22 DIAGNOSIS — C3400 Malignant neoplasm of unspecified main bronchus: Secondary | ICD-10-CM | POA: Diagnosis not present

## 2024-09-23 DIAGNOSIS — R4189 Other symptoms and signs involving cognitive functions and awareness: Secondary | ICD-10-CM | POA: Diagnosis not present

## 2024-09-23 DIAGNOSIS — C801 Malignant (primary) neoplasm, unspecified: Secondary | ICD-10-CM | POA: Diagnosis not present

## 2024-09-23 DIAGNOSIS — R630 Anorexia: Secondary | ICD-10-CM | POA: Diagnosis not present

## 2024-09-23 DIAGNOSIS — C7B8 Other secondary neuroendocrine tumors: Secondary | ICD-10-CM | POA: Diagnosis not present

## 2024-09-23 DIAGNOSIS — R633 Feeding difficulties, unspecified: Secondary | ICD-10-CM | POA: Diagnosis not present

## 2024-09-23 DIAGNOSIS — R634 Abnormal weight loss: Secondary | ICD-10-CM | POA: Diagnosis not present

## 2024-09-23 DIAGNOSIS — I2699 Other pulmonary embolism without acute cor pulmonale: Secondary | ICD-10-CM | POA: Diagnosis not present

## 2024-09-23 DIAGNOSIS — C7A8 Other malignant neuroendocrine tumors: Secondary | ICD-10-CM | POA: Diagnosis not present

## 2024-09-23 DIAGNOSIS — C3490 Malignant neoplasm of unspecified part of unspecified bronchus or lung: Secondary | ICD-10-CM | POA: Diagnosis not present

## 2024-09-23 DIAGNOSIS — K769 Liver disease, unspecified: Secondary | ICD-10-CM | POA: Diagnosis not present

## 2024-09-23 DIAGNOSIS — C7931 Secondary malignant neoplasm of brain: Secondary | ICD-10-CM | POA: Diagnosis not present

## 2024-09-23 DIAGNOSIS — C3400 Malignant neoplasm of unspecified main bronchus: Secondary | ICD-10-CM | POA: Diagnosis not present

## 2024-09-23 DIAGNOSIS — Z87891 Personal history of nicotine dependence: Secondary | ICD-10-CM | POA: Diagnosis not present

## 2024-09-23 DIAGNOSIS — Z5112 Encounter for antineoplastic immunotherapy: Secondary | ICD-10-CM | POA: Diagnosis not present

## 2024-09-23 DIAGNOSIS — R5381 Other malaise: Secondary | ICD-10-CM | POA: Diagnosis not present

## 2024-09-24 DIAGNOSIS — K5900 Constipation, unspecified: Secondary | ICD-10-CM | POA: Diagnosis not present

## 2024-09-24 DIAGNOSIS — C3490 Malignant neoplasm of unspecified part of unspecified bronchus or lung: Secondary | ICD-10-CM | POA: Diagnosis not present

## 2024-09-24 DIAGNOSIS — I2699 Other pulmonary embolism without acute cor pulmonale: Secondary | ICD-10-CM | POA: Diagnosis not present

## 2024-09-24 DIAGNOSIS — R634 Abnormal weight loss: Secondary | ICD-10-CM | POA: Diagnosis not present

## 2024-09-25 DIAGNOSIS — K219 Gastro-esophageal reflux disease without esophagitis: Secondary | ICD-10-CM | POA: Diagnosis not present

## 2024-09-25 DIAGNOSIS — K5790 Diverticulosis of intestine, part unspecified, without perforation or abscess without bleeding: Secondary | ICD-10-CM | POA: Diagnosis not present

## 2024-09-25 DIAGNOSIS — E119 Type 2 diabetes mellitus without complications: Secondary | ICD-10-CM | POA: Diagnosis not present

## 2024-09-25 DIAGNOSIS — I10 Essential (primary) hypertension: Secondary | ICD-10-CM | POA: Diagnosis not present

## 2024-09-25 DIAGNOSIS — E7800 Pure hypercholesterolemia, unspecified: Secondary | ICD-10-CM | POA: Diagnosis not present

## 2024-09-25 DIAGNOSIS — R634 Abnormal weight loss: Secondary | ICD-10-CM | POA: Diagnosis not present

## 2024-09-25 DIAGNOSIS — Z86011 Personal history of benign neoplasm of the brain: Secondary | ICD-10-CM | POA: Diagnosis not present

## 2024-09-25 DIAGNOSIS — K5900 Constipation, unspecified: Secondary | ICD-10-CM | POA: Diagnosis not present

## 2024-09-25 DIAGNOSIS — Z87891 Personal history of nicotine dependence: Secondary | ICD-10-CM | POA: Diagnosis not present

## 2024-10-07 DIAGNOSIS — I2699 Other pulmonary embolism without acute cor pulmonale: Secondary | ICD-10-CM | POA: Diagnosis not present

## 2024-10-07 DIAGNOSIS — C7931 Secondary malignant neoplasm of brain: Secondary | ICD-10-CM | POA: Diagnosis not present

## 2024-10-07 DIAGNOSIS — B370 Candidal stomatitis: Secondary | ICD-10-CM | POA: Diagnosis not present

## 2024-10-07 DIAGNOSIS — R5383 Other fatigue: Secondary | ICD-10-CM | POA: Diagnosis not present

## 2024-10-07 DIAGNOSIS — Z5112 Encounter for antineoplastic immunotherapy: Secondary | ICD-10-CM | POA: Diagnosis not present

## 2024-10-07 DIAGNOSIS — R29818 Other symptoms and signs involving the nervous system: Secondary | ICD-10-CM | POA: Diagnosis not present

## 2024-10-07 DIAGNOSIS — R634 Abnormal weight loss: Secondary | ICD-10-CM | POA: Diagnosis not present

## 2024-10-07 DIAGNOSIS — R4189 Other symptoms and signs involving cognitive functions and awareness: Secondary | ICD-10-CM | POA: Diagnosis not present

## 2024-10-07 DIAGNOSIS — E860 Dehydration: Secondary | ICD-10-CM | POA: Diagnosis not present

## 2024-10-07 DIAGNOSIS — R630 Anorexia: Secondary | ICD-10-CM | POA: Diagnosis not present

## 2024-10-07 DIAGNOSIS — E876 Hypokalemia: Secondary | ICD-10-CM | POA: Diagnosis not present

## 2024-10-07 DIAGNOSIS — C7B8 Other secondary neuroendocrine tumors: Secondary | ICD-10-CM | POA: Diagnosis not present

## 2024-10-07 DIAGNOSIS — R413 Other amnesia: Secondary | ICD-10-CM | POA: Diagnosis not present

## 2024-10-07 DIAGNOSIS — C3400 Malignant neoplasm of unspecified main bronchus: Secondary | ICD-10-CM | POA: Diagnosis not present

## 2024-10-07 DIAGNOSIS — C7A1 Malignant poorly differentiated neuroendocrine tumors: Secondary | ICD-10-CM | POA: Diagnosis not present

## 2024-10-22 ENCOUNTER — Emergency Department

## 2024-10-22 ENCOUNTER — Other Ambulatory Visit: Payer: Self-pay

## 2024-10-22 ENCOUNTER — Inpatient Hospital Stay
Admission: EM | Admit: 2024-10-22 | Discharge: 2024-11-04 | DRG: 871 | Disposition: A | Discharge status: Home Health | Attending: Obstetrics and Gynecology | Admitting: Obstetrics and Gynecology

## 2024-10-22 DIAGNOSIS — Z7901 Long term (current) use of anticoagulants: Secondary | ICD-10-CM

## 2024-10-22 DIAGNOSIS — R4182 Altered mental status, unspecified: Principal | ICD-10-CM

## 2024-10-22 DIAGNOSIS — C3492 Malignant neoplasm of unspecified part of left bronchus or lung: Secondary | ICD-10-CM | POA: Diagnosis present

## 2024-10-22 DIAGNOSIS — K219 Gastro-esophageal reflux disease without esophagitis: Secondary | ICD-10-CM | POA: Diagnosis present

## 2024-10-22 DIAGNOSIS — E860 Dehydration: Secondary | ICD-10-CM | POA: Diagnosis present

## 2024-10-22 DIAGNOSIS — E119 Type 2 diabetes mellitus without complications: Secondary | ICD-10-CM | POA: Diagnosis present

## 2024-10-22 DIAGNOSIS — Z87891 Personal history of nicotine dependence: Secondary | ICD-10-CM

## 2024-10-22 DIAGNOSIS — E876 Hypokalemia: Secondary | ICD-10-CM | POA: Diagnosis present

## 2024-10-22 DIAGNOSIS — C787 Secondary malignant neoplasm of liver and intrahepatic bile duct: Secondary | ICD-10-CM | POA: Diagnosis present

## 2024-10-22 DIAGNOSIS — L89156 Pressure-induced deep tissue damage of sacral region: Secondary | ICD-10-CM | POA: Diagnosis present

## 2024-10-22 DIAGNOSIS — Z83438 Family history of other disorder of lipoprotein metabolism and other lipidemia: Secondary | ICD-10-CM

## 2024-10-22 DIAGNOSIS — Z8249 Family history of ischemic heart disease and other diseases of the circulatory system: Secondary | ICD-10-CM

## 2024-10-22 DIAGNOSIS — Z86711 Personal history of pulmonary embolism: Secondary | ICD-10-CM

## 2024-10-22 DIAGNOSIS — E039 Hypothyroidism, unspecified: Secondary | ICD-10-CM | POA: Diagnosis present

## 2024-10-22 DIAGNOSIS — Z7984 Long term (current) use of oral hypoglycemic drugs: Secondary | ICD-10-CM

## 2024-10-22 DIAGNOSIS — Z923 Personal history of irradiation: Secondary | ICD-10-CM

## 2024-10-22 DIAGNOSIS — G9341 Metabolic encephalopathy: Secondary | ICD-10-CM | POA: Diagnosis present

## 2024-10-22 DIAGNOSIS — E7800 Pure hypercholesterolemia, unspecified: Secondary | ICD-10-CM | POA: Diagnosis present

## 2024-10-22 DIAGNOSIS — G936 Cerebral edema: Secondary | ICD-10-CM | POA: Diagnosis present

## 2024-10-22 DIAGNOSIS — Z6823 Body mass index (BMI) 23.0-23.9, adult: Secondary | ICD-10-CM

## 2024-10-22 DIAGNOSIS — Z90710 Acquired absence of both cervix and uterus: Secondary | ICD-10-CM

## 2024-10-22 DIAGNOSIS — N390 Urinary tract infection, site not specified: Secondary | ICD-10-CM | POA: Diagnosis present

## 2024-10-22 DIAGNOSIS — R54 Age-related physical debility: Secondary | ICD-10-CM | POA: Diagnosis present

## 2024-10-22 DIAGNOSIS — A4151 Sepsis due to Escherichia coli [E. coli]: Principal | ICD-10-CM | POA: Diagnosis present

## 2024-10-22 DIAGNOSIS — L8990 Pressure ulcer of unspecified site, unspecified stage: Secondary | ICD-10-CM

## 2024-10-22 DIAGNOSIS — A419 Sepsis, unspecified organism: Secondary | ICD-10-CM | POA: Diagnosis present

## 2024-10-22 DIAGNOSIS — C7931 Secondary malignant neoplasm of brain: Secondary | ICD-10-CM | POA: Diagnosis present

## 2024-10-22 DIAGNOSIS — Z86718 Personal history of other venous thrombosis and embolism: Secondary | ICD-10-CM

## 2024-10-22 DIAGNOSIS — E872 Acidosis, unspecified: Secondary | ICD-10-CM | POA: Diagnosis present

## 2024-10-22 DIAGNOSIS — D61818 Other pancytopenia: Secondary | ICD-10-CM | POA: Diagnosis present

## 2024-10-22 DIAGNOSIS — E870 Hyperosmolality and hypernatremia: Secondary | ICD-10-CM | POA: Diagnosis present

## 2024-10-22 DIAGNOSIS — I10 Essential (primary) hypertension: Secondary | ICD-10-CM | POA: Diagnosis present

## 2024-10-22 DIAGNOSIS — B9620 Unspecified Escherichia coli [E. coli] as the cause of diseases classified elsewhere: Secondary | ICD-10-CM

## 2024-10-22 DIAGNOSIS — Z803 Family history of malignant neoplasm of breast: Secondary | ICD-10-CM

## 2024-10-22 DIAGNOSIS — G9340 Encephalopathy, unspecified: Secondary | ICD-10-CM

## 2024-10-22 DIAGNOSIS — E43 Unspecified severe protein-calorie malnutrition: Secondary | ICD-10-CM | POA: Diagnosis present

## 2024-10-22 DIAGNOSIS — R627 Adult failure to thrive: Secondary | ICD-10-CM | POA: Diagnosis present

## 2024-10-22 DIAGNOSIS — R64 Cachexia: Secondary | ICD-10-CM | POA: Diagnosis present

## 2024-10-22 LAB — COMPREHENSIVE METABOLIC PANEL WITH GFR
ALT: 7 U/L (ref 0–44)
AST: 18 U/L (ref 15–41)
Albumin: 3.6 g/dL (ref 3.5–5.0)
Alkaline Phosphatase: 92 U/L (ref 38–126)
Anion gap: 17 — ABNORMAL HIGH (ref 5–15)
BUN: 31 mg/dL — ABNORMAL HIGH (ref 8–23)
CO2: 20 mmol/L — ABNORMAL LOW (ref 22–32)
Calcium: 11 mg/dL — ABNORMAL HIGH (ref 8.9–10.3)
Chloride: 111 mmol/L (ref 98–111)
Creatinine, Ser: 0.7 mg/dL (ref 0.44–1.00)
GFR, Estimated: >60 mL/min (ref >60)
Glucose, Bld: 133 mg/dL — ABNORMAL HIGH (ref 70–99)
Potassium: 3.9 mmol/L (ref 3.5–5.1)
Sodium: 149 mmol/L — ABNORMAL HIGH (ref 135–145)
Total Bilirubin: 0.9 mg/dL (ref 0.0–1.2)
Total Protein: 7.7 g/dL (ref 6.5–8.1)

## 2024-10-22 LAB — URINALYSIS, W/ REFLEX TO CULTURE (INFECTION SUSPECTED)
Bilirubin Urine: NEGATIVE
Glucose, UA: NEGATIVE mg/dL
Ketones, ur: NEGATIVE mg/dL
Nitrite: NEGATIVE
Protein, ur: 100 mg/dL — AB
Specific Gravity, Urine: 1.017 (ref 1.005–1.030)
WBC, UA: >50 WBC/hpf (ref 0–5)
pH: 5 (ref 5.0–8.0)

## 2024-10-22 LAB — CBC WITH DIFFERENTIAL/PLATELET
Abs Immature Granulocytes: 0.03 K/uL (ref 0.00–0.07)
Basophils Absolute: 0 K/uL (ref 0.0–0.1)
Basophils Relative: 0 %
Eosinophils Absolute: 0 K/uL (ref 0.0–0.5)
Eosinophils Relative: 0 %
HCT: 38.6 % (ref 36.0–46.0)
Hemoglobin: 13 g/dL (ref 12.0–15.0)
Immature Granulocytes: 0 %
Lymphocytes Relative: 2 %
Lymphs Abs: 0.2 K/uL — ABNORMAL LOW (ref 0.7–4.0)
MCH: 30.4 pg (ref 26.0–34.0)
MCHC: 33.7 g/dL (ref 30.0–36.0)
MCV: 90.2 fL (ref 80.0–100.0)
Monocytes Absolute: 0.2 K/uL (ref 0.1–1.0)
Monocytes Relative: 2 %
Neutro Abs: 10.7 K/uL — ABNORMAL HIGH (ref 1.7–7.7)
Neutrophils Relative %: 96 %
Platelets: 165 K/uL (ref 150–400)
RBC: 4.28 MIL/uL (ref 3.87–5.11)
RDW: 17 % — ABNORMAL HIGH (ref 11.5–15.5)
Smear Review: NORMAL
WBC: 11.2 K/uL — ABNORMAL HIGH (ref 4.0–10.5)
nRBC: 0 % (ref 0.0–0.2)

## 2024-10-22 LAB — HEMOGLOBIN A1C
Hgb A1c MFr Bld: 5.3 % (ref 4.8–5.6)
Mean Plasma Glucose: 105.41 mg/dL

## 2024-10-22 LAB — GLUCOSE, CAPILLARY
Glucose-Capillary: 124 mg/dL — ABNORMAL HIGH (ref 70–99)
Glucose-Capillary: 167 mg/dL — ABNORMAL HIGH (ref 70–99)

## 2024-10-22 LAB — LACTIC ACID, PLASMA
Lactic Acid, Venous: 3.1 mmol/L (ref 0.5–1.9)
Lactic Acid, Venous: 2.4 mmol/L (ref 0.5–1.9)

## 2024-10-22 LAB — PROTIME-INR
INR: 1.5 — ABNORMAL HIGH (ref 0.8–1.2)
Prothrombin Time: 19.3 s — ABNORMAL HIGH (ref 11.4–15.2)

## 2024-10-22 MED ORDER — TRAZODONE HCL 50 MG PO TABS
50.0000 mg | ORAL_TABLET | Freq: Every day | ORAL | Status: DC
  Administered 2024-10-22 – 2024-11-03 (×13): 50 mg via ORAL
  Filled 2024-10-22 (×13): qty 1

## 2024-10-22 MED ORDER — ACETAMINOPHEN 650 MG RE SUPP: 650.0000 mg | Freq: Four times a day (QID) | RECTAL | Status: DC | PRN

## 2024-10-22 MED ORDER — DEXTROSE-SODIUM CHLORIDE 5-0.45 % IV SOLN: INTRAVENOUS | Status: DC

## 2024-10-22 MED ORDER — METRONIDAZOLE 500 MG/100ML IV SOLN
500.0000 mg | Freq: Once | INTRAVENOUS | Status: DC
  Filled 2024-10-22: qty 100

## 2024-10-22 MED ORDER — MIRTAZAPINE 15 MG PO TBDP
15.0000 mg | ORAL_TABLET | Freq: Every day | ORAL | Status: DC
  Administered 2024-10-22 – 2024-11-03 (×13): 15 mg via ORAL
  Filled 2024-10-22 (×14): qty 1

## 2024-10-22 MED ORDER — SODIUM CHLORIDE 0.9 % IV SOLN
2.0000 g | Freq: Once | INTRAVENOUS | Status: AC
  Administered 2024-10-22: 2 g via INTRAVENOUS
  Filled 2024-10-22: qty 12.5

## 2024-10-22 MED ORDER — SODIUM CHLORIDE 0.9 % IV BOLUS
1000.0000 mL | Freq: Once | INTRAVENOUS | Status: AC
  Administered 2024-10-22: 1000 mL via INTRAVENOUS

## 2024-10-22 MED ORDER — LACTATED RINGERS IV SOLN: INTRAVENOUS | Status: DC

## 2024-10-22 MED ORDER — ONDANSETRON HCL 4 MG/2ML IJ SOLN: 4.0000 mg | Freq: Four times a day (QID) | INTRAMUSCULAR | Status: DC | PRN

## 2024-10-22 MED ORDER — ACETAMINOPHEN 325 MG PO TABS: 650.0000 mg | ORAL_TABLET | Freq: Four times a day (QID) | ORAL | Status: DC | PRN

## 2024-10-22 MED ORDER — APIXABAN 5 MG PO TABS
5.0000 mg | ORAL_TABLET | Freq: Two times a day (BID) | ORAL | Status: DC
  Administered 2024-10-22 – 2024-11-04 (×25): 5 mg via ORAL
  Filled 2024-10-22 (×25): qty 1

## 2024-10-22 MED ORDER — LACTATED RINGERS IV SOLN: 150.0000 mL/h | INTRAVENOUS | Status: DC

## 2024-10-22 MED ORDER — OXYCODONE HCL 5 MG PO TABS
5.0000 mg | ORAL_TABLET | ORAL | Status: DC | PRN
  Administered 2024-10-30: 05:00:00 5 mg via ORAL
  Filled 2024-10-22: qty 1

## 2024-10-22 MED ORDER — FLUTICASONE PROPIONATE 50 MCG/ACT NA SUSP: 2.0000 | Freq: Every day | NASAL | Status: DC | PRN

## 2024-10-22 MED ORDER — INSULIN ASPART 100 UNIT/ML IJ SOLN
0.0000 [IU] | Freq: Three times a day (TID) | INTRAMUSCULAR | Status: DC
  Administered 2024-10-23: 2 [IU] via SUBCUTANEOUS
  Administered 2024-10-23 – 2024-10-25 (×4): 1 [IU] via SUBCUTANEOUS
  Administered 2024-10-25: 2 [IU] via SUBCUTANEOUS
  Administered 2024-10-26: 1 [IU] via SUBCUTANEOUS
  Administered 2024-10-30 (×2): 2 [IU] via SUBCUTANEOUS
  Filled 2024-10-22: qty 2
  Filled 2024-10-22 (×4): qty 1
  Filled 2024-10-22 (×2): qty 2
  Filled 2024-10-22: qty 1
  Filled 2024-10-22: qty 2

## 2024-10-22 MED ORDER — VANCOMYCIN HCL IN DEXTROSE 1-5 GM/200ML-% IV SOLN
1000.0000 mg | Freq: Once | INTRAVENOUS | Status: DC
  Filled 2024-10-22: qty 200

## 2024-10-22 MED ORDER — INSULIN ASPART 100 UNIT/ML IJ SOLN: 0.0000 [IU] | Freq: Every day | INTRAMUSCULAR | Status: DC

## 2024-10-22 MED ORDER — LACTATED RINGERS IV BOLUS
1000.0000 mL | Freq: Once | INTRAVENOUS | Status: AC
  Administered 2024-10-22: 1000 mL via INTRAVENOUS

## 2024-10-22 MED ORDER — SODIUM CHLORIDE 0.9 % IV SOLN
2.0000 g | INTRAVENOUS | Status: AC
  Administered 2024-10-22 – 2024-10-29 (×7): 2 g via INTRAVENOUS
  Filled 2024-10-22 (×8): qty 20

## 2024-10-22 MED ORDER — ONDANSETRON HCL 4 MG PO TABS: 4.0000 mg | ORAL_TABLET | Freq: Four times a day (QID) | ORAL | Status: DC | PRN

## 2024-10-22 NOTE — ED Provider Notes (Signed)
 [facility] Provider Note    Event Date/Time   First MD Initiated Contact with Patient 10/22/24 1231     (approximate)   History   Altered Mental Status   HPI  Doris Johnson is a 73 y.o. female with a past medical history of metastatic colon cancer presenting to the emergency department via EMS for 2 days of altered mental status and decreased urinary output.  Per EMS the family reported that patient has good days and bad days but that she has seemed more confused than usual and that this decreased urine is abnormal for her.  They reported that they only changed 1 diaper yesterday and have not had to change any today.  The patient denies any pain or other complaints.     Physical Exam   Triage Vital Signs: ED Triage Vitals  Encounter Vitals Group     BP 10/22/24 1240 127/84     Girls Systolic BP Percentile --      Girls Diastolic BP Percentile --      Boys Systolic BP Percentile --      Boys Diastolic BP Percentile --      Pulse Rate 10/22/24 1235 (!) 101     Resp 10/22/24 1235 18     Temp 10/22/24 1235 (!) 97.5 F (36.4 C)     Temp Source 10/22/24 1235 Oral     SpO2 10/22/24 1235 100 %     Weight 10/22/24 1238 163 lb 2.3 oz (74 kg)     Height 10/22/24 1238 5' 3 (1.6 m)     Head Circumference --      Peak Flow --      Pain Score 10/22/24 1237 0     Pain Loc --      Pain Education --      Exclude from Growth Chart --     Most recent vital signs: Vitals:   10/22/24 1235 10/22/24 1240  BP:  127/84  Pulse: (!) 101   Resp: 18   Temp: (!) 97.5 F (36.4 C)   SpO2: 100%      General: Awake, no distress.  CV:  Good peripheral perfusion.  Normal sinus rhythm, no murmurs rubs or gallops Resp:  Normal effort.  Clear to auscultation Abd:  No distention.  Nontender to palpation Other:  Patient is alert and oriented to person but not to place or time   ED Results / Procedures / Treatments   Labs (all labs ordered are listed, but  only abnormal results are displayed) Labs Reviewed  CULTURE, BLOOD (ROUTINE X 2)  CULTURE, BLOOD (ROUTINE X 2)  COMPREHENSIVE METABOLIC PANEL WITH GFR  LACTIC ACID, PLASMA  LACTIC ACID, PLASMA  CBC WITH DIFFERENTIAL/PLATELET  PROTIME-INR  URINALYSIS, W/ REFLEX TO CULTURE (INFECTION SUSPECTED)     EKG  EKG shows a sinus rhythm at a rate of 85 bpm, normal axis, normal intervals, no significant ST elevations or depressions   RADIOLOGY Head CT shows known vasoedema, no significant change    PROCEDURES:  Critical Care performed: no  Procedures   MEDICATIONS ORDERED IN ED: Medications - No data to display   IMPRESSION / MDM / ASSESSMENT AND PLAN / ED COURSE  I reviewed the triage vital signs and the nursing notes.                              Differential diagnosis includes, but is not limited  to, UTI, bacteremia, worsening metastatic disease, ICH, electrolyte abnormalities.  Patient's presentation is most consistent with acute complicated illness / injury requiring diagnostic workup.  Patient is a 73 year old female with past medical history of metastatic lung cancer with known metastasis to the brain presenting to the emergency department via EMS for 2 days of decreased urinary output and altered mental status.  Head CT, EKG, chest x-ray, and lab work ordered for further evaluation.  The patient's lactic acid is elevated.  IV fluids ordered.  White blood cell count is slightly elevated at 11.2.  Head CT shows known vasogenic edema with no significant change.  Sodium is elevated at 149.  Currently being treated with IV fluids.  Urinalysis still pending however given the patient's lab findings as well as her altered mental status and tachycardia to be treated with broad-spectrum antibiotics.  Blood cultures have already been collected.  Urinalysis is consistent with UTI with many bacteria as well as greater than 50 white blood cells and a small amount of leukocyte  esterase.  Patient will be admitted for further workup and treatment.     FINAL CLINICAL IMPRESSION(S) / ED DIAGNOSES   Final diagnoses:  Altered mental status, unspecified altered mental status type  Hypernatremia  Lactic acidosis  Urinary tract infection without hematuria, site unspecified     Rx / DC Orders   ED Discharge Orders     None        Note:  This document was prepared using Dragon voice recognition software and may include unintentional dictation errors.   Rexford Reche HERO, MD 10/22/24 [phone]

## 2024-10-22 NOTE — H&P (Signed)
 History and Physical    Doris Johnson FMW:[id number] DOB: 1951-09-06 DOA: 10/22/2024  PCP: Epifanio Alm [name], MD  Patient coming from: Home  I have personally briefly reviewed patient's old medical records in [facility]  Chief Complaint: AMS, lethargy  HPI: Doris Johnson is a 73 y.o. female with medical history significant of lung cancer with diffuse metastasis, non-insulin -dependent diabetes mellitus, hypertension, hypothyroidism who presents to the ED for evaluation of decreased urinary output, lethargy, altered mentation over the past several days.  Patient was brought in by family.  Unable to provide history.  History obtained by speaking with the ED provider and reading medical record..  Per medical record EMS reports decreased movement and loss of appetite for 2 days.  Diaper at home has been only been changed once with no urinary output.  Known lung cancer with diffuse metastasis including cerebral metastasis gets treatment every 2 weeks at [facility] oncology.  Has not gone this week.  On presentation patient is ill-appearing.  She is very lethargic.  She is oriented to person.  Does not provide extensive history.  No family at bedside at time of my evaluation.  Vital signs stable.  ED Course: Treated for undifferentiated sepsis.  Patient was given broad-spectrum IV antibiotics and a bolus of intravenous fluids.  Hospitalist contacted for admission  Review of Systems: As per HPI otherwise 14 point review of systems negative.    Past Medical History:  Diagnosis Date   Cancer (HCC)    Cataract    Diabetes mellitus without complication (HCC)    Hyperlipidemia    Hypertension    Hypothyroidism    doctor's keeping an eye on thyroid     Lung cancer [facility])     Past Surgical History:  Procedure Laterality Date   ABDOMINAL HYSTERECTOMY     IR CV LINE INJECTION  09/28/2021   MYRINGOTOMY WITH TUBE PLACEMENT Right 12/29/2022   Procedure: MYRINGOTOMY WITH TUBE PLACEMENT;   Surgeon: Juengel, Paul, MD;  Location: [facility];  Service: ENT;  Laterality: Right;  Diabetic   PORTA CATH INSERTION N/A 11/30/2020   Procedure: PORTA CATH INSERTION;  Surgeon: Marea Selinda RAMAN, MD;  Location: [facility] INVASIVE CV LAB;  Service: Cardiovascular;  Laterality: N/A;   VIDEO BRONCHOSCOPY WITH ENDOBRONCHIAL NAVIGATION N/A 10/21/2020   Procedure: VIDEO BRONCHOSCOPY WITH ENDOBRONCHIAL NAVIGATION;  Surgeon: Parris Manna, MD;  Location: [facility] ORS;  Service: Thoracic;  Laterality: N/A;   VIDEO BRONCHOSCOPY WITH ENDOBRONCHIAL ULTRASOUND N/A 10/21/2020   Procedure: VIDEO BRONCHOSCOPY WITH ENDOBRONCHIAL ULTRASOUND;  Surgeon: Parris Manna, MD;  Location: [facility] ORS;  Service: Thoracic;  Laterality: N/A;     reports that she has never smoked. She has never used smokeless tobacco. She reports that she does not drink alcohol and does not use drugs.  No Known Allergies  Family History  Problem Relation Age of Onset   Hypertension Mother    Breast cancer Mother 57   Heart disease Father    Hyperlipidemia Brother    Heart disease Brother     Prior to Admission medications   Medication Sig Start Date End Date Taking? Authorizing Provider  apixaban  (ELIQUIS ) 5 MG TABS tablet Take 1 tablet (5 mg total) by mouth 2 (two) times daily. 05/09/23   Dasie Tinnie MATSU, NP  fluticasone  (FLONASE ) 50 MCG/ACT nasal spray Place 2 sprays into both nostrils daily. 04/26/17   Vivienne Delon HERO, PA-C  furosemide  (LASIX ) 20 MG tablet Take 20 mg by mouth.    [provider]  lidocaine -prilocaine  (EMLA ) cream Apply 1 Application topically as needed. Apply small amount to port site at least 1 hour prior to it being accessed, cover with plastic wrap 06/18/24   Melanee Annah BROCKS, MD  losartan (COZAAR) 25 MG tablet Take 1 tablet by mouth daily. 02/23/23 07/02/24  [provider]  metFORMIN (GLUCOPHAGE-XR) 500 MG 24 hr tablet Take by mouth. 02/08/24 02/07/25  [provider]  mirtazapine  (REMERON   SOL-TAB) 15 MG disintegrating tablet Take 15 mg by mouth at bedtime.    [provider]  potassium chloride  SA (KLOR-CON  M) 20 MEQ tablet TAKE 1 TABLET(20 MEQ) BY MOUTH DAILY 09/12/23   Melanee Annah BROCKS, MD  traZODone  (DESYREL ) 50 MG tablet Take 50 mg by mouth at bedtime.    [provider]  prochlorperazine  (COMPAZINE ) 10 MG tablet Take 1 tablet (10 mg total) by mouth every 6 (six) hours as needed (Nausea or vomiting). Patient not taking: Reported on 10/19/2021 11/12/20 07/19/22  Melanee Annah BROCKS, MD    Physical Exam: Vitals:   10/22/24 1240 10/22/24 1330 10/22/24 1350 10/22/24 1410  BP: 127/84 90/62  130/80  Pulse:   84 74  Resp:   13 15  Temp:      TempSrc:      SpO2:   100% 100%  Weight:      Height:       General: Chronically ill-appearing HEENT: Normocephalic, atraumatic, poor dentition Neck, supple, trachea midline, no tenderness Heart: Regular rate and rhythm, S1/S2 normal, no murmurs Lungs: Clear to auscultation bilaterally, no adventitious sounds, normal work of breathing Abdomen: Thin, soft, nontender, nondistended, positive bowel sounds Extremities: Normal, atraumatic, no clubbing or cyanosis, normal muscle tone Skin: No rashes or lesions, normal color Neurologic: Cranial nerves grossly intact, sensation intact, alert and oriented x1 Psychiatric: Blunted affect   Labs on Admission: I have personally reviewed following labs and imaging studies  CBC: Recent Labs  Lab 10/22/24 1244  WBC 11.2*  NEUTROABS 10.7*  HGB 13.0  HCT 38.6  MCV 90.2  PLT 165   Basic Metabolic Panel: Recent Labs  Lab 10/22/24 1244  NA 149*  K 3.9  CL 111  CO2 20*  GLUCOSE 133*  BUN 31*  CREATININE 0.70  CALCIUM 11.0*   GFR: Estimated Creatinine Clearance: 59.4 mL/min (by C-G formula based on SCr of 0.7 mg/dL). Liver Function Tests: Recent Labs  Lab 10/22/24 1244  AST 18  ALT 7  ALKPHOS 92  BILITOT 0.9  PROT 7.7  ALBUMIN 3.6   No results for input(s):  LIPASE, AMYLASE in the last 168 hours. No results for input(s): AMMONIA in the last 168 hours. Coagulation Profile: Recent Labs  Lab 10/22/24 1244  INR 1.5*   Cardiac Enzymes: No results for input(s): CKTOTAL, CKMB, CKMBINDEX, TROPONINI in the last 168 hours. BNP (last 3 results) No results for input(s): PROBNP in the last 8760 hours. HbA1C: No results for input(s): HGBA1C in the last 72 hours. CBG: No results for input(s): GLUCAP in the last 168 hours. Lipid Profile: No results for input(s): CHOL, HDL, LDLCALC, TRIG, CHOLHDL, LDLDIRECT in the last 72 hours. Thyroid  Function Tests: No results for input(s): TSH, T4TOTAL, FREET4, T3FREE, THYROIDAB in the last 72 hours. Anemia Panel: No results for input(s): VITAMINB12, FOLATE, FERRITIN, TIBC, IRON, RETICCTPCT in the last 72 hours. Urine analysis:    Component Value Date/Time   COLORURINE YELLOW (A) 10/22/2024 1317   APPEARANCEUR HAZY (A) 10/22/2024 1317   LABSPEC 1.017 10/22/2024 1317   PHURINE 5.0  10/22/2024 1317   GLUCOSEU NEGATIVE 10/22/2024 1317   HGBUR MODERATE (A) 10/22/2024 1317   BILIRUBINUR NEGATIVE 10/22/2024 1317   KETONESUR NEGATIVE 10/22/2024 1317   PROTEINUR 100 (A) 10/22/2024 1317   NITRITE NEGATIVE 10/22/2024 1317   LEUKOCYTESUR SMALL (A) 10/22/2024 1317    Radiological Exams on Admission: CT Head Wo Contrast Result Date: 10/22/2024 CLINICAL DATA:  Altered mental status.  Metastatic lung carcinoma. EXAM: CT HEAD WITHOUT CONTRAST TECHNIQUE: Contiguous axial images were obtained from the base of the skull through the vertex without intravenous contrast. RADIATION DOSE REDUCTION: This exam was performed according to the departmental dose-optimization program which includes automated exposure control, adjustment of the mA and/or kV according to patient size and/or use of iterative reconstruction technique. COMPARISON:  Head MRI on 06/28/2024 FINDINGS: Brain: No  evidence of intracranial hemorrhage, acute infarction, hydrocephalus, or extra-axial collection. Mild vasogenic edema is again seen in the high right parietal lobe at site of metastasis visualized on prior MRI. No evidence of mass effect or midline shift. Stable mild diffuse cerebral atrophy and moderate chronic small vessel disease. Vascular:  No hyperdense vessel or other acute findings. Skull: No evidence of fracture or other significant bone abnormality. Sinuses/Orbits:  Right mastoid effusion again noted. Other: None. IMPRESSION: No significant change in mild vasogenic edema in the high right parietal lobe at site of metastasis visualized on prior MRI. No evidence of intracranial hemorrhage, mass effect or midline shift. Stable mild cerebral atrophy and moderate chronic small vessel disease. Stable right mastoid effusion. Electronically Signed   By: Norleen DELENA Kil M.D.   On: 10/22/2024 14:40   DG Chest Port 1 View if patient is in a treatment room. Result Date: 10/22/2024 CLINICAL DATA:  Suspected Sepsis EXAM: PORTABLE CHEST - 1 VIEW COMPARISON:  CT chest abdomen pelvis 06/27/2024 and chest radiograph 10/21/2020 FINDINGS: Cardiomediastinal silhouette and pulmonary vasculature are within normal limits. Posttreatment changes again seen in the LEFT upper medial lung. Lungs otherwise clear. RIGHT IJ chest port terminates in the region of the superior vena cava. IMPRESSION: No acute cardiopulmonary process. Electronically Signed   By: Aliene Lloyd M.D.   On: 10/22/2024 14:10    EKG: Independently reviewed.  Sinus rhythm  Assessment/Plan Principal Problem:   Sepsis secondary to UTI (HCC)  Sepsis Presumed secondary to UTI.  Associate with leukocytosis, tachycardia, altered mentation, elevated lactic acid Plan: Place observation IV ceftriaxone  IV resuscitation fluids Monitor vitals and fever curve Follow cultures  Decreased urinary output Decrease oral intake Altered mentation Likely related to  underlying urinary tract infection in the setting of known metastatic lung cancer Plan: Treatment for UTI as above IV fluids  Hypernatremia Indicative of dehydration On hypotonic IV fluids  Lung cancer with diffuse metastasis Patient follows with [facility] oncology  History of VTE Resume home Eliquis   Suspected severe protein calorie malnutrition RD consultation  DVT prophylaxis: Eliquis  Code Status: Presumed full Family Communication: None.  Attempted to call daughter Odella Moose and brother Zachary Courier.  Both numbers went straight to voicemail.  Voicemail not left on either number given nonpersonal voicemail and concern for HIPAA compliance Disposition Plan: Return to previous home environment Consults called: None at this time Admission status: Observation, telemetry   Calvin KATHEE Robson MD [facility]   If 7PM-7AM, please contact night-coverage   10/22/2024, 3:49 PM

## 2024-10-22 NOTE — ED Notes (Addendum)
 Doris Johnson

## 2024-10-22 NOTE — Consult Note (Signed)
 CODE SEPSIS - PHARMACY COMMUNICATION  **Broad Spectrum Antibiotics should be administered within 1 hour of Sepsis diagnosis**  Time Code Sepsis Called/Page Received: 1453  Antibiotics Ordered: Cefepime  x1, Flagyl  x1, Vancomycin  x1   Time of 1st antibiotic administration: 1507  Additional action taken by pharmacy: none  If necessary, Name of Provider/Nurse Contacted: n/a    Annabella LOISE Banks ,PharmD Clinical Pharmacist  10/22/2024  2:55 PM

## 2024-10-22 NOTE — Sepsis Progress Note (Signed)
 eLink is following this Code Sepsis.

## 2024-10-22 NOTE — ED Triage Notes (Signed)
 Pt BIB [facility] from home Doris Johnson. EMS reports decreased movement and loss of appetitie for more than two days. Has not gotten off couch since yesterday. Was changed once yesterday. Pt has lung cancer that has spread to brain and other areas, gets treatment every 2 wk, suppose to go this week, family decided not to go this week to give pt a break. 125/69 136 cbg  102hr 23end tit 97 ax temp 20g R AC

## 2024-10-23 DIAGNOSIS — I10 Essential (primary) hypertension: Secondary | ICD-10-CM | POA: Diagnosis present

## 2024-10-23 DIAGNOSIS — R7881 Bacteremia: Secondary | ICD-10-CM | POA: Diagnosis not present

## 2024-10-23 DIAGNOSIS — Z8249 Family history of ischemic heart disease and other diseases of the circulatory system: Secondary | ICD-10-CM | POA: Diagnosis not present

## 2024-10-23 DIAGNOSIS — Z7189 Other specified counseling: Secondary | ICD-10-CM | POA: Diagnosis not present

## 2024-10-23 DIAGNOSIS — D61818 Other pancytopenia: Secondary | ICD-10-CM | POA: Diagnosis present

## 2024-10-23 DIAGNOSIS — C7931 Secondary malignant neoplasm of brain: Secondary | ICD-10-CM | POA: Diagnosis present

## 2024-10-23 DIAGNOSIS — E870 Hyperosmolality and hypernatremia: Secondary | ICD-10-CM | POA: Diagnosis present

## 2024-10-23 DIAGNOSIS — C3490 Malignant neoplasm of unspecified part of unspecified bronchus or lung: Secondary | ICD-10-CM | POA: Diagnosis not present

## 2024-10-23 DIAGNOSIS — R64 Cachexia: Secondary | ICD-10-CM | POA: Diagnosis present

## 2024-10-23 DIAGNOSIS — E871 Hypo-osmolality and hyponatremia: Secondary | ICD-10-CM | POA: Diagnosis not present

## 2024-10-23 DIAGNOSIS — E43 Unspecified severe protein-calorie malnutrition: Secondary | ICD-10-CM

## 2024-10-23 DIAGNOSIS — E7800 Pure hypercholesterolemia, unspecified: Secondary | ICD-10-CM | POA: Diagnosis present

## 2024-10-23 DIAGNOSIS — Z515 Encounter for palliative care: Secondary | ICD-10-CM | POA: Diagnosis not present

## 2024-10-23 DIAGNOSIS — R627 Adult failure to thrive: Secondary | ICD-10-CM | POA: Diagnosis present

## 2024-10-23 DIAGNOSIS — N390 Urinary tract infection, site not specified: Secondary | ICD-10-CM | POA: Diagnosis present

## 2024-10-23 DIAGNOSIS — Z7901 Long term (current) use of anticoagulants: Secondary | ICD-10-CM | POA: Diagnosis not present

## 2024-10-23 DIAGNOSIS — E872 Acidosis, unspecified: Secondary | ICD-10-CM | POA: Diagnosis present

## 2024-10-23 DIAGNOSIS — C787 Secondary malignant neoplasm of liver and intrahepatic bile duct: Secondary | ICD-10-CM | POA: Diagnosis present

## 2024-10-23 DIAGNOSIS — A4151 Sepsis due to Escherichia coli [E. coli]: Secondary | ICD-10-CM | POA: Diagnosis present

## 2024-10-23 DIAGNOSIS — C3492 Malignant neoplasm of unspecified part of left bronchus or lung: Secondary | ICD-10-CM | POA: Diagnosis present

## 2024-10-23 DIAGNOSIS — G936 Cerebral edema: Secondary | ICD-10-CM | POA: Diagnosis present

## 2024-10-23 DIAGNOSIS — R4182 Altered mental status, unspecified: Secondary | ICD-10-CM | POA: Diagnosis not present

## 2024-10-23 DIAGNOSIS — E876 Hypokalemia: Secondary | ICD-10-CM | POA: Diagnosis present

## 2024-10-23 DIAGNOSIS — G9340 Encephalopathy, unspecified: Secondary | ICD-10-CM | POA: Diagnosis not present

## 2024-10-23 DIAGNOSIS — G9341 Metabolic encephalopathy: Secondary | ICD-10-CM | POA: Diagnosis present

## 2024-10-23 DIAGNOSIS — E039 Hypothyroidism, unspecified: Secondary | ICD-10-CM | POA: Diagnosis present

## 2024-10-23 DIAGNOSIS — K219 Gastro-esophageal reflux disease without esophagitis: Secondary | ICD-10-CM | POA: Diagnosis present

## 2024-10-23 DIAGNOSIS — Z789 Other specified health status: Secondary | ICD-10-CM | POA: Diagnosis not present

## 2024-10-23 DIAGNOSIS — B9620 Unspecified Escherichia coli [E. coli] as the cause of diseases classified elsewhere: Secondary | ICD-10-CM | POA: Diagnosis not present

## 2024-10-23 DIAGNOSIS — E860 Dehydration: Secondary | ICD-10-CM | POA: Diagnosis present

## 2024-10-23 DIAGNOSIS — Z7984 Long term (current) use of oral hypoglycemic drugs: Secondary | ICD-10-CM | POA: Diagnosis not present

## 2024-10-23 DIAGNOSIS — E119 Type 2 diabetes mellitus without complications: Secondary | ICD-10-CM | POA: Diagnosis present

## 2024-10-23 DIAGNOSIS — A419 Sepsis, unspecified organism: Secondary | ICD-10-CM | POA: Diagnosis not present

## 2024-10-23 LAB — BLOOD CULTURE ID PANEL (REFLEXED) - BCID2

## 2024-10-23 LAB — CBC
HCT: 33.6 % — ABNORMAL LOW (ref 36.0–46.0)
Hemoglobin: 11.2 g/dL — ABNORMAL LOW (ref 12.0–15.0)
MCH: 30.3 pg (ref 26.0–34.0)
MCHC: 33.3 g/dL (ref 30.0–36.0)
MCV: 90.8 fL (ref 80.0–100.0)
Platelets: 137 K/uL — ABNORMAL LOW (ref 150–400)
RBC: 3.7 MIL/uL — ABNORMAL LOW (ref 3.87–5.11)
RDW: 16.4 % — ABNORMAL HIGH (ref 11.5–15.5)
WBC: 10.3 K/uL (ref 4.0–10.5)
nRBC: 0 % (ref 0.0–0.2)

## 2024-10-23 LAB — BASIC METABOLIC PANEL WITH GFR
Anion gap: 17 — ABNORMAL HIGH (ref 5–15)
BUN: 26 mg/dL — ABNORMAL HIGH (ref 8–23)
CO2: 18 mmol/L — ABNORMAL LOW (ref 22–32)
Calcium: 9.8 mg/dL (ref 8.9–10.3)
Chloride: 114 mmol/L — ABNORMAL HIGH (ref 98–111)
Creatinine, Ser: 0.62 mg/dL (ref 0.44–1.00)
GFR, Estimated: >60 mL/min (ref >60)
Glucose, Bld: 153 mg/dL — ABNORMAL HIGH (ref 70–99)
Potassium: 3.1 mmol/L — ABNORMAL LOW (ref 3.5–5.1)
Sodium: 148 mmol/L — ABNORMAL HIGH (ref 135–145)

## 2024-10-23 LAB — GLUCOSE, CAPILLARY
Glucose-Capillary: 163 mg/dL — ABNORMAL HIGH (ref 70–99)
Glucose-Capillary: 148 mg/dL — ABNORMAL HIGH (ref 70–99)
Glucose-Capillary: 124 mg/dL — ABNORMAL HIGH (ref 70–99)

## 2024-10-23 MED ORDER — OLANZAPINE 5 MG PO TABS
5.0000 mg | ORAL_TABLET | Freq: Every day | ORAL | Status: DC
  Administered 2024-10-23 – 2024-11-03 (×12): 5 mg via ORAL
  Filled 2024-10-23 (×13): qty 1

## 2024-10-23 MED ORDER — KCL IN DEXTROSE-NACL 40-5-0.45 MEQ/L-%-% IV SOLN
INTRAVENOUS | Status: AC
  Administered 2024-10-23: 1000 mL via INTRAVENOUS
  Filled 2024-10-23 (×4): qty 1000

## 2024-10-23 MED ORDER — ENSURE PLUS HIGH PROTEIN PO LIQD
237.0000 mL | Freq: Three times a day (TID) | ORAL | Status: DC
  Administered 2024-10-24 – 2024-11-04 (×21): 237 mL via ORAL

## 2024-10-23 MED ORDER — POTASSIUM CHLORIDE CRYS ER 20 MEQ PO TBCR
20.0000 meq | EXTENDED_RELEASE_TABLET | Freq: Two times a day (BID) | ORAL | Status: DC
  Administered 2024-10-23 – 2024-10-25 (×5): 20 meq via ORAL
  Filled 2024-10-23 (×5): qty 1

## 2024-10-23 MED ORDER — ADULT MULTIVITAMIN W/MINERALS CH
1.0000 | ORAL_TABLET | Freq: Every day | ORAL | Status: DC
  Administered 2024-10-24 – 2024-11-04 (×10): 1 via ORAL
  Filled 2024-10-23 (×11): qty 1

## 2024-10-23 MED ORDER — THIAMINE HCL 100 MG/ML IJ SOLN
100.0000 mg | Freq: Every day | INTRAMUSCULAR | Status: DC
  Administered 2024-10-24: 100 mg via INTRAVENOUS
  Filled 2024-10-23: qty 2

## 2024-10-23 NOTE — Progress Notes (Signed)
 PHARMACY - PHYSICIAN COMMUNICATION CRITICAL VALUE ALERT - BLOOD CULTURE IDENTIFICATION (BCID)  Results for orders placed or performed during the hospital encounter of 10/22/24  Culture, blood (Routine x 2)     Status: None (Preliminary result)   Collection Time: 10/22/24 12:44 PM   Specimen: Left Antecubital; Blood  Result Value Ref Range Status   Specimen Description LEFT ANTECUBITAL  Final   Special Requests   Final    BOTTLES DRAWN AEROBIC AND ANAEROBIC Blood Culture results may not be optimal due to an inadequate volume of blood received in culture bottles   Culture  Setup Time   Final    Organism ID to follow GRAM NEGATIVE RODS IN BOTH AEROBIC AND ANAEROBIC BOTTLES CRITICAL RESULT CALLED TO, READ BACK BY AND VERIFIED WITH: Bethaney Oshana PHARMD @0103  10/23/24 ASW Performed at [facility], [address] [place], [place] [address]    Culture GRAM NEGATIVE RODS  Final   Report Status PENDING  Incomplete  Blood Culture ID Panel (Reflexed)     Status: Abnormal   Collection Time: 10/22/24 12:44 PM  Result Value Ref Range Status   Enterococcus faecalis NOT DETECTED NOT DETECTED Final   Enterococcus Faecium NOT DETECTED NOT DETECTED Final   Listeria monocytogenes NOT DETECTED NOT DETECTED Final   Staphylococcus species NOT DETECTED NOT DETECTED Final   Staphylococcus aureus (BCID) NOT DETECTED NOT DETECTED Final   Staphylococcus epidermidis NOT DETECTED NOT DETECTED Final   Staphylococcus lugdunensis NOT DETECTED NOT DETECTED Final   Streptococcus species NOT DETECTED NOT DETECTED Final   Streptococcus agalactiae NOT DETECTED NOT DETECTED Final   Streptococcus pneumoniae NOT DETECTED NOT DETECTED Final   Streptococcus pyogenes NOT DETECTED NOT DETECTED Final   A.calcoaceticus-baumannii NOT DETECTED NOT DETECTED Final   Bacteroides fragilis NOT DETECTED NOT DETECTED Final   Enterobacterales DETECTED (A) NOT DETECTED Final    Comment: Enterobacterales represent a large  order of gram negative bacteria, not a single organism. CRITICAL RESULT CALLED TO, READ BACK BY AND VERIFIED WITH: Reni Hausner PHARMD @0103  10/23/24 ASW    Enterobacter cloacae complex NOT DETECTED NOT DETECTED Final   Escherichia coli DETECTED (A) NOT DETECTED Final    Comment: CRITICAL RESULT CALLED TO, READ BACK BY AND VERIFIED WITH: Elveta Rape PHARMD @0103  10/23/24 ASW    Klebsiella aerogenes NOT DETECTED NOT DETECTED Final   Klebsiella oxytoca NOT DETECTED NOT DETECTED Final   Klebsiella pneumoniae NOT DETECTED NOT DETECTED Final   Proteus species NOT DETECTED NOT DETECTED Final   Salmonella species NOT DETECTED NOT DETECTED Final   Serratia marcescens NOT DETECTED NOT DETECTED Final   Haemophilus influenzae NOT DETECTED NOT DETECTED Final   Neisseria meningitidis NOT DETECTED NOT DETECTED Final   Pseudomonas aeruginosa NOT DETECTED NOT DETECTED Final   Stenotrophomonas maltophilia NOT DETECTED NOT DETECTED Final   Candida albicans NOT DETECTED NOT DETECTED Final   Candida auris NOT DETECTED NOT DETECTED Final   Candida glabrata NOT DETECTED NOT DETECTED Final   Candida krusei NOT DETECTED NOT DETECTED Final   Candida parapsilosis NOT DETECTED NOT DETECTED Final   Candida tropicalis NOT DETECTED NOT DETECTED Final   Cryptococcus neoformans/gattii NOT DETECTED NOT DETECTED Final   CTX-M ESBL NOT DETECTED NOT DETECTED Final   Carbapenem resistance IMP NOT DETECTED NOT DETECTED Final   Carbapenem resistance KPC NOT DETECTED NOT DETECTED Final   Carbapenem resistance NDM NOT DETECTED NOT DETECTED Final   Carbapenem resist OXA 48 LIKE NOT DETECTED NOT DETECTED Final   Carbapenem  resistance VIM NOT DETECTED NOT DETECTED Final    Comment: Performed at [facility], [address] [place], [place] [address]    BCID Results: 2 (same set) of 4 bottles w/ E. Coli, no resistance.  Pt already on Ceftriaxone  2 gm q24h.  Name of provider contacted: HILARIO Solian, MD   Changes  to prescribed antibiotics required: No changes needed at this time  Rankin CANDIE Dills, PharmD, [facility] 10/23/2024 1:27 AM

## 2024-10-23 NOTE — Progress Notes (Signed)
 Initial Nutrition Assessment  DOCUMENTATION CODES:   Severe malnutrition in context of chronic illness  INTERVENTION:   Ensure Plus High Protein po TID, each supplement provides 350 kcal and 20 grams of protein  Magic cup TID with meals, each supplement provides 290 kcal and 9 grams of protein  MVI po daily   Thiamine 100mg  IV daily x 7 days   Pt at high refeed risk; recommend monitor potassium, magnesium and phosphorus labs daily until stable  Daily weights   NUTRITION DIAGNOSIS:   Severe Malnutrition related to chronic illness (metastatic cancer) as evidenced by severe muscle depletion, severe fat depletion, 22 percent weight loss in 6 months.  GOAL:   Patient will meet greater than or equal to 90% of their needs  MONITOR:   PO intake, Supplement acceptance, Labs, Weight trends, I & O's, Skin  REASON FOR ASSESSMENT:   Consult Assessment of nutrition requirement/status  ASSESSMENT:   73 y/o female with h/o HLD, DM, metastatic SCC lung on chemotherapy, HTN, lymphedema, hypothyroidism, GERD and VTE who is admitted with UTI, sepsis, AMS and bacteremia.  Met with pt in room today. Pt is a poor historian but is able to report that she does not feel well today. Pt is asking if its cold outside. Pt shakes her head no when asked if she has been eating well. Pt is on remeron . Pt's lunch tray is sitting untouched on her side table. Pt is documented to be eating only sips/bites of meals. Pt is followed by the RD at the [facility] and was last seen on 11/10. Pt with ongoing poor appetite and oral intake for several months. Pt has been drinking Ensure daily at home. Per chart, pt is down 38lbs(22%) over the past 6 months; this is severe weight loss. RD will add supplements and vitamins to help pt meet her estimated needs. Pt is at high refeed risk. Of note, pt with poor dentition and requires a soft diet. Palliative care consult is pending. Feeding tube is not recommended as it  would not likely change pt's outcomes or improve her quality of life.   Medications reviewed and include: insulin , remeron , KCl, ceftriaxone , NaCl w/ 5% dextrose  @100ml /hr  Labs reviewed: Na 148(H), K 3.1(L), BUN 26(H) Cbgs- 148, 163 x 24 hrs  AIC 5.3- 12/9  UOP-   NUTRITION - FOCUSED PHYSICAL EXAM:  Flowsheet Row Most Recent Value  Orbital Region Moderate depletion  Upper Arm Region Severe depletion  Thoracic and Lumbar Region Moderate depletion  Buccal Region Moderate depletion  Temple Region Severe depletion  Clavicle Bone Region Severe depletion  Clavicle and Acromion Bone Region Severe depletion  Scapular Bone Region Moderate depletion  Dorsal Hand Moderate depletion  Patellar Region Severe depletion  Anterior Thigh Region Severe depletion  Posterior Calf Region Severe depletion  Edema (RD Assessment) Mild  Hair Reviewed  Eyes Reviewed  Mouth Reviewed  Skin Reviewed  Nails Reviewed   Diet Order:   Diet Order             DIET DYS 2 Room service appropriate? Yes; Fluid consistency: Thin  Diet effective now                  EDUCATION NEEDS:   Not appropriate for education at this time  Skin:  Skin Assessment: Reviewed RN Assessment (Stage I buttocks)  Last BM:  pta  Height:   Ht Readings from Last 1 Encounters:  10/22/24 5' 3 (1.6 m)    Weight:  Wt Readings from Last 1 Encounters:  10/23/24 61.3 kg    Ideal Body Weight:  52.3 kg  BMI:  Body mass index is 23.94 kg/m.  Estimated Nutritional Needs:   Kcal:  1700-1900kcal/day  Protein:  85-95g/day  Fluid:  1.4-1.6L/day  Augustin Shams MS, RD, LDN If unable to be reached, please send secure chat to RD inpatient available from 8:00a-4:00p daily

## 2024-10-23 NOTE — Evaluation (Signed)
 Physical Therapy Evaluation Patient Details Name: Doris Johnson MRN: [id number] DOB: 08-Mar-1951 Today's Date: 10/23/2024  History of Present Illness  Doris Johnson is a 73 y.o. female with medical history significant of lung cancer with diffuse metastasis, non-insulin -dependent diabetes mellitus, hypertension, hypothyroidism who presents to the ED for evaluation of decreased urinary output, lethargy, altered mentation over the past several days.  Patient was brought in by family.   Clinical Impression  Patient received in bed, she is alert, confused. Not oriented other than to person. Patient is delayed in command following and answering questions. Lethargic. She is agreeable to PT assessment. Patient requires max A to get positioned and seated at edge of bed. Poor sitting balance requiring max assist to maintain sitting upright. Patient will continue to benefit from skilled PT to improve activity and strength.          If plan is discharge home, recommend the following: Two people to help with walking and/or transfers;A lot of help with bathing/dressing/bathroom   Can travel by private vehicle    no    Equipment Recommendations Other (comment) (TBD)  Recommendations for Other Services       Functional Status Assessment Patient has had a recent decline in their functional status and/or demonstrates limited ability to make significant improvements in function in a reasonable and predictable amount of time     Precautions / Restrictions Precautions Precautions: Fall Recall of Precautions/Restrictions: Impaired Restrictions Weight Bearing Restrictions Per Provider Order: No      Mobility  Bed Mobility Overal bed mobility: Needs Assistance Bed Mobility: Supine to Sit, Sit to Supine, Rolling Rolling: Mod assist   Supine to sit: Max assist Sit to supine: Max assist   General bed mobility comments: requires max A to maintain sitting balance.    Transfers                    General transfer comment: unable/unsafe    Ambulation/Gait               General Gait Details: unable  Stairs            Wheelchair Mobility     Tilt Bed    Modified Rankin (Stroke Patients Only)       Balance Overall balance assessment: Needs assistance Sitting-balance support: Feet supported Sitting balance-Leahy Scale: Zero   Postural control: Posterior lean, Right lateral lean                                   Pertinent Vitals/Pain      Home Living                     Additional Comments: patient is poor historian states she lives with brother but limited in providing information    Prior Function               Mobility Comments: unsure ADLs Comments: unsure     Extremity/Trunk Assessment   Upper Extremity Assessment Upper Extremity Assessment: Defer to OT evaluation    Lower Extremity Assessment Lower Extremity Assessment: Generalized weakness    Cervical / Trunk Assessment Cervical / Trunk Assessment: Normal  Communication   Communication Communication: Impaired Factors Affecting Communication: Difficulty expressing self;Reduced clarity of speech    Cognition Arousal: Lethargic, Alert Behavior During Therapy: Flat affect   PT - Cognitive impairments: No family/caregiver present to determine baseline, Difficult to  assess, Orientation, Awareness, Problem solving, Safety/Judgement, Sequencing, Initiation Difficult to assess due to: Level of arousal Orientation impairments: Place, Time, Situation                     Following commands: Impaired Following commands impaired: Follows one step commands with increased time, Follows one step commands inconsistently     Cueing Cueing Techniques: Verbal cues, Gestural cues     General Comments      Exercises     Assessment/Plan    PT Assessment Patient needs continued PT services  PT Problem List Decreased strength;Decreased  activity tolerance;Decreased balance;Decreased mobility;Decreased cognition       PT Treatment Interventions Functional mobility training;Therapeutic activities;Therapeutic exercise;Balance training;Cognitive remediation;Patient/family education;Gait training;DME instruction    PT Goals (Current goals can be found in the Care Plan section)  Acute Rehab PT Goals Patient Stated Goal: none stated PT Goal Formulation: Patient unable to participate in goal setting Time For Goal Achievement: 11/06/24    Frequency Min 2X/week     Co-evaluation PT/OT/SLP Co-Evaluation/Treatment: Yes Reason for Co-Treatment: For patient/therapist safety;To address functional/ADL transfers PT goals addressed during session: Mobility/safety with mobility;Balance         AM-PAC PT 6 Clicks Mobility  Outcome Measure Help needed turning from your back to your side while in a flat bed without using bedrails?: A Lot Help needed moving from lying on your back to sitting on the side of a flat bed without using bedrails?: A Lot Help needed moving to and from a bed to a chair (including a wheelchair)?: Total Help needed standing up from a chair using your arms (e.g., wheelchair or bedside chair)?: Total Help needed to walk in hospital room?: Total Help needed climbing 3-5 steps with a railing? : Total 6 Click Score: 8    End of Session   Activity Tolerance: Patient limited by fatigue;Patient limited by lethargy Patient left: in bed;with call bell/phone within reach;with bed alarm set Nurse Communication: Mobility status PT Visit Diagnosis: Other abnormalities of gait and mobility (R26.89);Muscle weakness (generalized) (M62.81)    Time: 8958-8942 PT Time Calculation (min) (ACUTE ONLY): 16 min   Charges:   PT Evaluation $PT Eval Low Complexity: 1 Low   PT General Charges $$ ACUTE PT VISIT: 1 Visit         Taisley Mordan, PT, GCS 10/23/24,11:37 AM

## 2024-10-23 NOTE — Consult Note (Addendum)
 Consultation Note Date: 10/23/2024 at 1530  Patient Name: Doris Johnson  DOB: February 21, 1951  MRN: [id number]  Age / Sex: 73 y.o., female  PCP: Epifanio Alm [name], MD Referring Physician: Jhonny Calvin NOVAK, MD  HPI/Patient Profile: 73 y.o. female  with past medical history of lung cancer with diffuse metastasis, non-insulin -dependent diabetes mellitus, hypertension, hypothyroidism admitted on 10/22/2024 with AMS and lethargy.  As per chart review, pt presented to ED for evaluation of decreased urine output, lethargy, and altered mentation over the past several days PTA.   Workup revealed blood cultures positive for E. Coli bacteremia. Pt is being treated for E.coli bacteremia, decreased urine output and oral intake, acute metabolic encephalopathy, hypernatremia, and (suspected) severe protein calorie malnutrition.   Clinical Assessment and Goals of Care: Extensive chart review completed prior to meeting patient including labs, vital signs, imaging, progress notes, orders, and available advanced directive documents from current and previous encounters. I then met with patient at bedside. She is awake and acknolwedged my presence with eye contact. However, her responses to basic orientation questions were delayed and non-sensical. When I asked, Why are you in the hospital? She said, Hmmm. Yes.. I asked, Do you know why you are here with us  in the hospital? She said, ok.  Symptoms assessed. Pt was able to deny physical pain/discomfort. However, she could not participate in a complete symptom assessment. Non verbal signs of distress not noted - such a grimacing, guarding, brow furrowing, fidgeting, or withdrawing from light physical touch. No adjustment to [facility] needed at this time.  I outlined course of hospitalization. DIscussed AMS and lethargy as reason for admission. Reviewed blood culture results, EColi  bacteremia, antibiotic course, and IVF rescucitation. Attempted to ensure patient has understanding of her current medical situation but question her comprehension today.   Patient remains unable to participate in GOC or medical decision making independently at this time.   As per chart review, patient completed an HCPOA outlining her wishes and naming her daughter as her 1st surrogate decision maker and her brother as her 2nd.  Additionally, patient's advance directive shares that if she is unable to communicate healthcare decisions that she decided that her life not be prolonged by life-prolonging measures in all three scenarios given. Please see Ad in ACP tab of epic for full details.  After assessing the patient, I attempted to speak with patient's HCPOA/daughter Doris Johnson over the phone.  No answer.  HIPAA compliant voicemail left with PMT contact info given.  I then attempted to speak with patient's second surrogate decision maker - her brother.  No answer.  HIPAA compliant voicemail left with PMT contact info given.  Unable to complete goals of care discussions at this time.  Awaiting return phone call from family.  PMT will continue to follow and support.  No change to plan of care.  Primary Decision Maker HCPOA  Physical Exam Vitals reviewed.  Constitutional:      Comments: Thin, frail  HENT:     Mouth/Throat:     Mouth:  Mucous membranes are moist.  Eyes:     Pupils: Pupils are equal, round, and reactive to light.  Pulmonary:     Effort: Pulmonary effort is normal.  Abdominal:     Palpations: Abdomen is soft.  Musculoskeletal:     Comments: Generalized weakness  Skin:    General: Skin is warm and dry.  Neurological:     Mental Status: She is alert.     Comments: UTA     Palliative Assessment/Data: 30-40%     Thank you for this consult. Palliative medicine will continue to follow and assist holistically.   40 minute visit includes: Detailed review of medical records  (labs, imaging, vital signs), medically appropriate exam (mental status, respiratory, cardiac, skin), discussed with treatment team, counseling and educating patient, family and staff, documenting clinical information, medication management and coordination of care.  Signed by: Lamarr Gunner, DNP, FNP-BC Palliative Medicine   Please contact Palliative Medicine Team providers via [facility] for questions and concerns.

## 2024-10-23 NOTE — Progress Notes (Signed)
 New sacral foam placed

## 2024-10-23 NOTE — Progress Notes (Signed)
 PROGRESS NOTE    Doris Johnson  FMW:[id number] DOB: 1951/09/29 DOA: 10/22/2024 PCP: Epifanio Alm [name], MD    Brief Narrative:   73 y.o. female with medical history significant of lung cancer with diffuse metastasis, non-insulin -dependent diabetes mellitus, hypertension, hypothyroidism who presents to the ED for evaluation of decreased urinary output, lethargy, altered mentation over the past several days.  Patient was brought in by family.   Unable to provide history.  History obtained by speaking with the ED provider and reading medical record..  Per medical record EMS reports decreased movement and loss of appetite for 2 days.  Diaper at home has been only been changed once with no urinary output.  Known lung cancer with diffuse metastasis including cerebral metastasis gets treatment every 2 weeks at [facility] oncology.  Has not gone this week.  12/10: Blood culture positive for E. coli.  Suspect urinary source.   Assessment & Plan:   Principal Problem:   Sepsis secondary to UTI (HCC)   Sepsis E. coli bacteremia Presumed secondary to UTI.  Associate with leukocytosis, tachycardia, altered mentation, elevated lactic acid Plan: Sinew IV Rocephin  2 g daily Continue IV resuscitation fluids Monitor vitals and fever curve Follow cultures for speciation and sensitivities   Decreased urinary output Decrease oral intake Acute metabolic encephalopathy likely related to underlying urinary tract infection in the setting of known metastatic lung cancer Plan: Continue IV fluids Continue treatment for gram-negative bacteremia as above   Hypernatremia Indicative of dehydration On hypotonic IV fluids, will continue   Lung cancer with diffuse metastasis Patient follows with [facility] oncology   History of VTE Resume home Eliquis    Suspected severe protein calorie malnutrition RD consultation   DVT prophylaxis: Eliquis  Code Status: Presumed full Family Communication:None.  Attempted to  call daughter Odella Moose and brother Zachary Courier.  Both numbers went straight to voicemail.  Voicemail not left on either number given nonpersonal voicemail and concern for HIPAA compliance  Disposition Plan: Status is: Inpatient Remains inpatient appropriate because: Gram-negative bacteremia on IV antibiotics   Level of care: Telemetry  Consultants:  None  Procedures:  None  Antimicrobials: Ceftriaxone    Subjective: Seen and examined.  Remains lethargic though may be slightly improved from prior.  Objective: Vitals:   10/22/24 2103 10/22/24 2323 10/23/24 0347 10/23/24 0907  BP: 112/73 124/73 134/72 (!) 94/59  Pulse: 70 65 (!) 56 73  Resp: 18 18 18 18   Temp: 97.8 F (36.6 C) 97.8 F (36.6 C) (!) 97.3 F (36.3 C)   TempSrc:   Axillary   SpO2: 100% 100% 97% 97%  Weight:      Height:        Intake/Output Summary (Last 24 hours) at 10/23/2024 1222 Last data filed at 10/23/2024 1036 Gross per 24 hour  Intake 2435.91 ml  Output 400 ml  Net 2035.91 ml   Filed Weights   10/22/24 1238  Weight: 74 kg    Examination:  General: Chronically ill-appearing HEENT: Normocephalic, atraumatic, poor dentition Neck, supple, trachea midline, no tenderness Heart: Regular rate and rhythm, S1/S2 normal, no murmurs Lungs: Clear to auscultation bilaterally, no adventitious sounds, normal work of breathing Abdomen: Thin, soft, nontender, nondistended, positive bowel sounds Extremities: Normal, atraumatic, no clubbing or cyanosis, normal muscle tone Skin: No rashes or lesions, normal color Neurologic: Cranial nerves grossly intact, sensation intact, alert and oriented x1 Psychiatric: Blunted affect   Data Reviewed: I have personally reviewed following labs and imaging studies  CBC: Recent Labs  Lab 10/22/24  1244 10/23/24 0338  WBC 11.2* 10.3  NEUTROABS 10.7*  --   HGB 13.0 11.2*  HCT 38.6 33.6*  MCV 90.2 90.8  PLT 165 137*   Basic Metabolic Panel: Recent Labs   Lab 10/22/24 1244 10/23/24 0338  NA 149* 148*  K 3.9 3.1*  CL 111 114*  CO2 20* 18*  GLUCOSE 133* 153*  BUN 31* 26*  CREATININE 0.70 0.62  CALCIUM 11.0* 9.8   GFR: Estimated Creatinine Clearance: 59.4 mL/min (by C-G formula based on SCr of 0.62 mg/dL). Liver Function Tests: Recent Labs  Lab 10/22/24 1244  AST 18  ALT 7  ALKPHOS 92  BILITOT 0.9  PROT 7.7  ALBUMIN 3.6   No results for input(s): LIPASE, AMYLASE in the last 168 hours. No results for input(s): AMMONIA in the last 168 hours. Coagulation Profile: Recent Labs  Lab 10/22/24 1244  INR 1.5*   Cardiac Enzymes: No results for input(s): CKTOTAL, CKMB, CKMBINDEX, TROPONINI in the last 168 hours. BNP (last 3 results) No results for input(s): PROBNP in the last 8760 hours. HbA1C: Recent Labs    10/22/24 1244  HGBA1C 5.3   CBG: Recent Labs  Lab 10/22/24 1801 10/22/24 2105 10/23/24 0902 10/23/24 1150  GLUCAP 124* 167* 163* 148*   Lipid Profile: No results for input(s): CHOL, HDL, LDLCALC, TRIG, CHOLHDL, LDLDIRECT in the last 72 hours. Thyroid  Function Tests: No results for input(s): TSH, T4TOTAL, FREET4, T3FREE, THYROIDAB in the last 72 hours. Anemia Panel: No results for input(s): VITAMINB12, FOLATE, FERRITIN, TIBC, IRON, RETICCTPCT in the last 72 hours. Sepsis Labs: Recent Labs  Lab 10/22/24 1244 10/22/24 1513  LATICACIDVEN 3.1* 2.4*    Recent Results (from the past 240 hours)  Culture, blood (Routine x 2)     Status: None (Preliminary result)   Collection Time: 10/22/24 12:44 PM   Specimen: BLOOD  Result Value Ref Range Status   Specimen Description BLOOD BLOOD LEFT HAND  Final   Special Requests   Final    BOTTLES DRAWN AEROBIC AND ANAEROBIC Blood Culture results may not be optimal due to an inadequate volume of blood received in culture bottles   Culture  Setup Time   Final    GRAM NEGATIVE RODS IN BOTH AEROBIC AND ANAEROBIC  BOTTLES CRITICAL VALUE NOTED.  VALUE IS CONSISTENT WITH PREVIOUSLY REPORTED AND CALLED VALUE. Performed at [facility], [address] [place], [place] [address]    Culture GRAM NEGATIVE RODS  Final   Report Status PENDING  Incomplete  Culture, blood (Routine x 2)     Status: None (Preliminary result)   Collection Time: 10/22/24 12:44 PM   Specimen: Left Antecubital; Blood  Result Value Ref Range Status   Specimen Description   Final    LEFT ANTECUBITAL Performed at [facility], [address], [place], [place] [address]    Special Requests   Final    BOTTLES DRAWN AEROBIC AND ANAEROBIC Blood Culture results may not be optimal due to an inadequate volume of blood received in culture bottles Performed at [facility], [address] [place], [place] [address]    Culture  Setup Time   Final    GRAM NEGATIVE RODS IN BOTH AEROBIC AND ANAEROBIC BOTTLES CRITICAL RESULT CALLED TO, READ BACK BY AND VERIFIED WITH: NATHAN BELUE PHARMD @0103  10/23/24 ASW Performed at [facility], [address], [place], [place] [address]    Culture GRAM NEGATIVE RODS  Final   Report Status PENDING  Incomplete  Blood Culture ID Panel (Reflexed)     Status: Abnormal   Collection Time: 10/22/24 12:44 PM  Result Value Ref Range Status   Enterococcus faecalis NOT DETECTED NOT DETECTED Final   Enterococcus Faecium NOT DETECTED NOT DETECTED Final   Listeria monocytogenes NOT DETECTED NOT DETECTED Final   Staphylococcus species NOT DETECTED NOT DETECTED Final   Staphylococcus aureus (BCID) NOT DETECTED NOT DETECTED Final   Staphylococcus epidermidis NOT DETECTED NOT DETECTED Final   Staphylococcus lugdunensis NOT DETECTED NOT DETECTED Final   Streptococcus species NOT DETECTED NOT DETECTED Final   Streptococcus agalactiae NOT DETECTED NOT DETECTED Final   Streptococcus pneumoniae NOT DETECTED NOT DETECTED Final   Streptococcus pyogenes NOT DETECTED NOT DETECTED Final    A.calcoaceticus-baumannii NOT DETECTED NOT DETECTED Final   Bacteroides fragilis NOT DETECTED NOT DETECTED Final   Enterobacterales DETECTED (A) NOT DETECTED Final    Comment: Enterobacterales represent a large order of gram negative bacteria, not a single organism. CRITICAL RESULT CALLED TO, READ BACK BY AND VERIFIED WITH: NATHAN BELUE PHARMD @0103  10/23/24 ASW    Enterobacter cloacae complex NOT DETECTED NOT DETECTED Final   Escherichia coli DETECTED (A) NOT DETECTED Final    Comment: CRITICAL RESULT CALLED TO, READ BACK BY AND VERIFIED WITH: NATHAN BELUE PHARMD @0103  10/23/24 ASW    Klebsiella aerogenes NOT DETECTED NOT DETECTED Final   Klebsiella oxytoca NOT DETECTED NOT DETECTED Final   Klebsiella pneumoniae NOT DETECTED NOT DETECTED Final   Proteus species NOT DETECTED NOT DETECTED Final   Salmonella species NOT DETECTED NOT DETECTED Final   Serratia marcescens NOT DETECTED NOT DETECTED Final   Haemophilus influenzae NOT DETECTED NOT DETECTED Final   Neisseria meningitidis NOT DETECTED NOT DETECTED Final   Pseudomonas aeruginosa NOT DETECTED NOT DETECTED Final   Stenotrophomonas maltophilia NOT DETECTED NOT DETECTED Final   Candida albicans NOT DETECTED NOT DETECTED Final   Candida auris NOT DETECTED NOT DETECTED Final   Candida glabrata NOT DETECTED NOT DETECTED Final   Candida krusei NOT DETECTED NOT DETECTED Final   Candida parapsilosis NOT DETECTED NOT DETECTED Final   Candida tropicalis NOT DETECTED NOT DETECTED Final   Cryptococcus neoformans/gattii NOT DETECTED NOT DETECTED Final   CTX-M ESBL NOT DETECTED NOT DETECTED Final   Carbapenem resistance IMP NOT DETECTED NOT DETECTED Final   Carbapenem resistance KPC NOT DETECTED NOT DETECTED Final   Carbapenem resistance NDM NOT DETECTED NOT DETECTED Final   Carbapenem resist OXA 48 LIKE NOT DETECTED NOT DETECTED Final   Carbapenem resistance VIM NOT DETECTED NOT DETECTED Final    Comment: Performed at [facility],  [address] [place], [place] [address]  Urine Culture     Status: Abnormal (Preliminary result)   Collection Time: 10/22/24  1:17 PM   Specimen: Urine, Random  Result Value Ref Range Status   Specimen Description   Final    URINE, RANDOM Performed at [facility], [address], [place], [place] [address]    Special Requests   Final    NONE Reflexed from [id number] Performed at [facility], [address] [place], [place] [address]    Culture (A)  Final    >=100,000 COLONIES/mL ESCHERICHIA COLI SUSCEPTIBILITIES TO FOLLOW Performed at [facility], [address] [place], [place] [address]    Report Status PENDING  Incomplete         Radiology Studies: CT Head Wo Contrast Result Date: 10/22/2024 CLINICAL DATA:  Altered mental status.  Metastatic lung  carcinoma. EXAM: CT HEAD WITHOUT CONTRAST TECHNIQUE: Contiguous axial images were obtained from the base of the skull through the vertex without intravenous contrast. RADIATION DOSE REDUCTION: This exam was performed according to the departmental dose-optimization program which includes automated exposure control, adjustment of the mA and/or kV according to patient size and/or use of iterative reconstruction technique. COMPARISON:  Head MRI on 06/28/2024 FINDINGS: Brain: No evidence of intracranial hemorrhage, acute infarction, hydrocephalus, or extra-axial collection. Mild vasogenic edema is again seen in the high right parietal lobe at site of metastasis visualized on prior MRI. No evidence of mass effect or midline shift. Stable mild diffuse cerebral atrophy and moderate chronic small vessel disease. Vascular:  No hyperdense vessel or other acute findings. Skull: No evidence of fracture or other significant bone abnormality. Sinuses/Orbits:  Right mastoid effusion again noted. Other: None. IMPRESSION: No significant change in mild vasogenic edema in the high right parietal lobe at site of metastasis visualized  on prior MRI. No evidence of intracranial hemorrhage, mass effect or midline shift. Stable mild cerebral atrophy and moderate chronic small vessel disease. Stable right mastoid effusion. Electronically Signed   By: Norleen DELENA Kil M.D.   On: 10/22/2024 14:40   DG Chest Port 1 View if patient is in a treatment room. Result Date: 10/22/2024 CLINICAL DATA:  Suspected Sepsis EXAM: PORTABLE CHEST - 1 VIEW COMPARISON:  CT chest abdomen pelvis 06/27/2024 and chest radiograph 10/21/2020 FINDINGS: Cardiomediastinal silhouette and pulmonary vasculature are within normal limits. Posttreatment changes again seen in the LEFT upper medial lung. Lungs otherwise clear. RIGHT IJ chest port terminates in the region of the superior vena cava. IMPRESSION: No acute cardiopulmonary process. Electronically Signed   By: Aliene Lloyd M.D.   On: 10/22/2024 14:10        Scheduled Meds:  apixaban   5 mg Oral BID   insulin  aspart  0-5 Units Subcutaneous QHS   insulin  aspart  0-9 Units Subcutaneous TID WC   mirtazapine   15 mg Oral QHS   OLANZapine   5 mg Oral QHS   potassium chloride   20 mEq Oral BID   traZODone   50 mg Oral QHS   Continuous Infusions:  cefTRIAXone  (ROCEPHIN )  IV Stopped (10/22/24 2329)   dextrose  5 % and 0.45 % NaCl with KCl 40 mEq/L 1,000 mL (10/23/24 0925)     LOS: 0 days     Calvin KATHEE Robson, MD [facility]   If 7PM-7AM, please contact night-coverage  10/23/2024, 12:22 PM

## 2024-10-23 NOTE — Evaluation (Signed)
 Occupational Therapy Evaluation Patient Details Name: Doris Johnson MRN: [id number] DOB: 1951/08/12 Today's Date: 10/23/2024   History of Present Illness   Pt is a 73 year old female presents to the ED for evaluation of decreased urinary output, lethargy, altered mentation admitted with sepsis, Acute metabolic encephalopathy     PMH significant for lung cancer with diffuse metastasis, non-insulin -dependent diabetes mellitus, hypertension, hypothyroidism     Clinical Impressions Chart reviewed to date, pt greeted semi supine in bed, oriented to self. She requires multi modal cues to participate in one step directives. Unable to provide PLOF. Pt requires MAX A for supine<>sit, poor static sitting balance on edge of bed requiring MAX A for static sitting. MAX A required for LB ADLs, MOD A for grooming tasks. Pt will benefit from acute OT to address functional deficits and to facilitate optimal ADL/functional mobility engagement.      If plan is discharge home, recommend the following:   A lot of help with walking and/or transfers;A lot of help with bathing/dressing/bathroom;Supervision due to cognitive status     Functional Status Assessment   Patient has had a recent decline in their functional status and demonstrates the ability to make significant improvements in function in a reasonable and predictable amount of time.     Equipment Recommendations   Wheelchair cushion (measurements OT);Hospital bed (unable to confirm home equipment, unsure if pt has this equipment)     Recommendations for Other Services         Precautions/Restrictions   Precautions Precautions: Fall Recall of Precautions/Restrictions: Impaired Restrictions Weight Bearing Restrictions Per Provider Order: No     Mobility Bed Mobility Overal bed mobility: Needs Assistance Bed Mobility: Supine to Sit, Sit to Supine, Rolling Rolling: Mod assist   Supine to sit: Max assist Sit to supine: Max  assist   General bed mobility comments: step by step multi modal cueing    Transfers                          Balance Overall balance assessment: Needs assistance Sitting-balance support: Feet supported Sitting balance-Leahy Scale: Zero Sitting balance - Comments: MAX A to maintain static sitting balance Postural control: Posterior lean, Right lateral lean     Standing balance comment: NT                           ADL either performed or assessed with clinical judgement   ADL Overall ADL's : Needs assistance/impaired     Grooming: Moderate assistance;Bed level               Lower Body Dressing: Maximal assistance;Bed level                       Vision   Additional Comments: will continue to assess     Perception         Praxis         Pertinent Vitals/Pain Pain Assessment Pain Assessment: PAINAD Breathing: normal Negative Vocalization: none Facial Expression: sad, frightened, frown Body Language: relaxed Consolability: no need to console PAINAD Score: 1 Pain Intervention(s): Monitored during session     Extremity/Trunk Assessment Upper Extremity Assessment Upper Extremity Assessment: Generalized weakness   Lower Extremity Assessment Lower Extremity Assessment: Generalized weakness   Cervical / Trunk Assessment Cervical / Trunk Assessment: Normal   Communication Communication Communication: Impaired Factors Affecting Communication: Difficulty expressing self;Reduced clarity of speech  Cognition Arousal: Lethargic, Alert Behavior During Therapy: Flat affect Cognition: Cognition impaired, No family/caregiver present to determine baseline   Orientation impairments: Place, Time, Situation Awareness: Intellectual awareness impaired, Online awareness impaired Memory impairment (select all impairments): Short-term memory, Working civil service fast streamer, Engineer, structural memory Attention impairment (select first level of  impairment): Focused attention (distracted by external simulation) Executive functioning impairment (select all impairments): Initiation, Organization, Reasoning, Sequencing, Problem solving                   Following commands: Impaired Following commands impaired: Follows one step commands with increased time, Follows one step commands inconsistently     Cueing  General Comments   Cueing Techniques: Verbal cues;Gestural cues  vss   Exercises Other Exercises Other Exercises: edu re role of OT, role of rehab   Shoulder Instructions      Home Living Family/patient expects to be discharged to:: Private residence                                 Additional Comments: pt is a poor historian, need to confirm PLOF, per care everywehere it appears as if family was pro      Prior Functioning/Environment Prior Level of Function : Patient poor historian/Family not available             Mobility Comments: unsure ADLs Comments: unsure    OT Problem List: Decreased strength;Decreased activity tolerance;Impaired balance (sitting and/or standing);Decreased knowledge of precautions;Decreased knowledge of use of DME or AE;Decreased safety awareness   OT Treatment/Interventions: Self-care/ADL training;Therapeutic exercise;DME and/or AE instruction;Balance training;Patient/family education;Therapeutic activities      OT Goals(Current goals can be found in the care plan section)   Acute Rehab OT Goals OT Goal Formulation: Patient unable to participate in goal setting Time For Goal Achievement: 11/06/24 ADL Goals Pt Will Perform Grooming: with supervision;sitting;standing Pt Will Perform Lower Body Dressing: with supervision;sitting/lateral leans;sit to/from stand Pt Will Transfer to Toilet: with supervision;stand pivot transfer Pt Will Perform Toileting - Clothing Manipulation and hygiene: with supervision;sitting/lateral leans;sit to/from stand   OT Frequency:   Min 2X/week    Co-evaluation PT/OT/SLP Co-Evaluation/Treatment: Yes Reason for Co-Treatment: For patient/therapist safety;To address functional/ADL transfers PT goals addressed during session: Mobility/safety with mobility;Balance OT goals addressed during session: ADL's and self-care      AM-PAC OT 6 Clicks Daily Activity     Outcome Measure Help from another person eating meals?: A Lot Help from another person taking care of personal grooming?: A Lot Help from another person toileting, which includes using toliet, bedpan, or urinal?: Total Help from another person bathing (including washing, rinsing, drying)?: A Lot Help from another person to put on and taking off regular upper body clothing?: A Lot Help from another person to put on and taking off regular lower body clothing?: A Lot 6 Click Score: 11   End of Session Nurse Communication: Mobility status  Activity Tolerance: Patient limited by lethargy Patient left: in bed;with call bell/phone within reach;with bed alarm set  OT Visit Diagnosis: Other abnormalities of gait and mobility (R26.89);Muscle weakness (generalized) (M62.81)                Time: 8953-8943 OT Time Calculation (min): 10 min Charges:  OT General Charges $OT Visit: 1 Visit  Therisa Sheffield, OTD OTR/L  10/23/24, 1:26 PM

## 2024-10-24 LAB — URINE CULTURE: Culture: >=100000 — AB

## 2024-10-24 LAB — GLUCOSE, CAPILLARY
Glucose-Capillary: 108 mg/dL — ABNORMAL HIGH (ref 70–99)
Glucose-Capillary: 121 mg/dL — ABNORMAL HIGH (ref 70–99)
Glucose-Capillary: 89 mg/dL (ref 70–99)
Glucose-Capillary: 98 mg/dL (ref 70–99)
Glucose-Capillary: 108 mg/dL — ABNORMAL HIGH (ref 70–99)
Glucose-Capillary: 121 mg/dL — ABNORMAL HIGH (ref 70–99)

## 2024-10-24 LAB — BASIC METABOLIC PANEL WITH GFR
Anion gap: 15 (ref 5–15)
BUN: 17 mg/dL (ref 8–23)
CO2: 17 mmol/L — ABNORMAL LOW (ref 22–32)
Calcium: 9.5 mg/dL (ref 8.9–10.3)
Chloride: 115 mmol/L — ABNORMAL HIGH (ref 98–111)
Creatinine, Ser: 0.45 mg/dL (ref 0.44–1.00)
GFR, Estimated: >60 mL/min (ref >60)
Glucose, Bld: 91 mg/dL (ref 70–99)
Potassium: 3.3 mmol/L — ABNORMAL LOW (ref 3.5–5.1)
Sodium: 147 mmol/L — ABNORMAL HIGH (ref 135–145)

## 2024-10-24 LAB — MAGNESIUM: Magnesium: 2.3 mg/dL (ref 1.7–2.4)

## 2024-10-24 LAB — PHOSPHORUS: Phosphorus: 1.4 mg/dL — ABNORMAL LOW (ref 2.5–4.6)

## 2024-10-24 MED ORDER — KCL IN DEXTROSE-NACL 40-5-0.45 MEQ/L-%-% IV SOLN
INTRAVENOUS | Status: AC
  Filled 2024-10-24 (×3): qty 1000

## 2024-10-24 MED ORDER — THIAMINE MONONITRATE 100 MG PO TABS
100.0000 mg | ORAL_TABLET | Freq: Every day | ORAL | Status: AC
  Administered 2024-10-25 – 2024-10-30 (×4): 100 mg via ORAL
  Filled 2024-10-24 (×5): qty 1

## 2024-10-24 MED ORDER — POTASSIUM CHLORIDE 20 MEQ PO PACK: 40.0000 meq | PACK | Freq: Once | ORAL | Status: DC

## 2024-10-24 MED ORDER — SODIUM CHLORIDE 0.9% FLUSH
10.0000 mL | Freq: Two times a day (BID) | INTRAVENOUS | Status: DC
  Administered 2024-10-24 – 2024-10-28 (×6): 10 mL
  Administered 2024-10-28: 09:00:00 20 mL
  Administered 2024-10-29: 10:00:00 10 mL
  Administered 2024-10-29: 22:00:00 20 mL
  Administered 2024-10-30: 10:00:00 10 mL
  Administered 2024-10-30: 21:00:00 20 mL
  Administered 2024-10-31 – 2024-11-01 (×3): 10 mL
  Administered 2024-11-01 – 2024-11-03 (×4): 20 mL
  Administered 2024-11-03: 40 mL
  Administered 2024-11-04: 10 mL

## 2024-10-24 MED ORDER — SODIUM CHLORIDE 0.9% FLUSH
10.0000 mL | INTRAVENOUS | Status: DC | PRN
  Administered 2024-11-04: 10 mL

## 2024-10-24 MED ORDER — CHLORHEXIDINE GLUCONATE CLOTH 2 % EX PADS
6.0000 | MEDICATED_PAD | Freq: Every day | CUTANEOUS | Status: DC
  Administered 2024-10-24 – 2024-11-04 (×12): 6 via TOPICAL

## 2024-10-24 NOTE — Plan of Care (Signed)

## 2024-10-24 NOTE — Plan of Care (Signed)
°  Problem: Clinical Measurements: Goal: Signs and symptoms of infection will decrease Outcome: Progressing   Problem: Respiratory: Goal: Ability to maintain adequate ventilation will improve Outcome: Progressing   Problem: Clinical Measurements: Goal: Respiratory complications will improve Outcome: Progressing Goal: Cardiovascular complication will be avoided Outcome: Progressing   Problem: Coping: Goal: Level of anxiety will decrease Outcome: Progressing   Problem: Pain Managment: Goal: General experience of comfort will improve and/or be controlled Outcome: Progressing   Problem: Safety: Goal: Ability to remain free from injury will improve Outcome: Progressing   Problem: Skin Integrity: Goal: Risk for impaired skin integrity will decrease Outcome: Progressing

## 2024-10-24 NOTE — Progress Notes (Signed)
 Palliative Care Progress Note, Assessment & Plan   Patient Name: Doris Johnson       Date: 10/24/2024 DOB: 21-Oct-1951  Age: 73 y.o. MRN#: [id number] Attending Physician: Dana Ivan Masse* Primary Care Physician: Epifanio Alm [name], MD Admit Date: 10/22/2024  Subjective: Patient is lying in bed in NAD. She is awake and acknowledges my presence.  No family or friends present at bedside during my visit.  HPI: 73 y.o. female  with past medical history of lung cancer with diffuse metastasis, non-insulin -dependent diabetes mellitus, hypertension, hypothyroidism admitted on 10/22/2024 with AMS and lethargy.   As per chart review, pt presented to ED for evaluation of decreased urine output, lethargy, and altered mentation over the past several days PTA.    Workup revealed blood cultures positive for E. Coli bacteremia. Pt is being treated for E.coli bacteremia, decreased urine output and oral intake, acute metabolic encephalopathy, hypernatremia, and (suspected) severe protein calorie malnutrition.   Summary of counseling/coordination of care: Extensive chart review completed prior to meeting patient including labs, vital signs, imaging, progress notes, orders, and available advanced directive documents from current and previous encounters.   After reviewing the patient's chart and assessing the patient at bedside, I spoke with patient in regards to symptom management and goals of care.   I attempted to gauge patient's orientation and capacity.  She is able to tell me her name and where she is.  However, she has a significantly delayed response to being able to tell me her daughter's name.  She is unable to answer any other questions appropriately.  She remains unable to participate in goals of care  medical decision making independently at this time.  Symptoms assessed.  Patient denies pain or discomfort at this time.  She denies headache, chest pain, and other acute issues at this time.  Nonverbal signs of pain or discomfort not noted.  No adjustment to [facility] needed at this time.  Palliative medicine team administrator received a phone call from patient's daughter requesting a callback between 12 and 12:30 PM.  I attempted to speak with patient's daughter during this time.  No answer.  HIPAA compliant voicemail left with PMT contact info given again.  PMT will continue to attempt goals of care discussions with patient's HCPOA/daughter.  No change to plan of care at this time.  Physical Exam Vitals reviewed.  Constitutional:      Comments: thin  HENT:     Mouth/Throat:     Mouth: Mucous membranes are moist.     Comments: Poor dentition, missing teeth Eyes:     Pupils: Pupils are equal, round, and reactive to light.  Cardiovascular:     Rate and Rhythm: Normal rate.  Pulmonary:     Effort: Pulmonary effort is normal.  Abdominal:     Palpations: Abdomen is soft.  Musculoskeletal:     Comments: Generalized weakness MAETC  Skin:    General: Skin is warm and dry.  Neurological:     Mental Status: She is alert.     Comments: Oriented to self and place             25 minute visit includes: Detailed review of medical records (labs, imaging,  vital signs), medically appropriate exam (mental status, respiratory, cardiac, skin), discussed with treatment team, counseling and educating patient, family and staff, documenting clinical information, medication management and coordination of care.  Lamarr L. Arvid, DNP, FNP-BC Palliative Medicine Team

## 2024-10-24 NOTE — Progress Notes (Addendum)
 PROGRESS NOTE    Doris Johnson  FMW:[id number]  DOB: [birth year]  DOA: 10/22/2024 PCP: Epifanio Alm [name], MD Outpatient Specialists:   Hospital course: 73 y.o. female with medical history significant of lung cancer with diffuse metastasis, non-insulin -dependent diabetes mellitus, hypertension, hypothyroidism who presents to the ED for evaluation of decreased urinary output, lethargy, altered mentation over the past several days.  Patient was brought in by family.   Unable to provide history.  History obtained by speaking with the ED provider and reading medical record..  Per medical record EMS reports decreased movement and loss of appetite for 2 days.  Diaper at home has been only been changed once with no urinary output.  Known lung cancer with diffuse metastasis including cerebral metastasis gets treatment every 2 weeks at [facility].  Has not gone this week.   12/10: Blood culture positive for E. coli.  Suspect urinary source. 12/11: Patient remains quite confused, not making sense when I speak with her.  Attempts to call daughter x 2 with being sent to voicemail.  Unable to contact any family.     Subjective:  Patient is awake but does not seem to be answering questions appropriately.  She understands that she is in the hospital.  After much questioning, patient is able to state that she lives with her daughter and 2 granddaughters, says yes when I ask if I should contact her.  Denies pain or discomfort.  Objective: Vitals:   10/24/24 0349 10/24/24 0445 10/24/24 0757 10/24/24 1608  BP: 113/72  118/72 93/60  Pulse: (!) 57  (!) 59 100  Resp: 16  18 16   Temp: (!) 97.4 F (36.3 C)   97.6 F (36.4 C)  TempSrc:      SpO2: 100%  100% 91%  Weight:  58.6 kg    Height:        Intake/Output Summary (Last 24 hours) at 10/24/2024 1657 Last data filed at 10/24/2024 1331 Gross per 24 hour  Intake 2420.98 ml  Output 500 ml  Net 1920.98 ml   Filed Weights   10/22/24 1238  10/23/24 1617 10/24/24 0445  Weight: 74 kg 61.3 kg 58.6 kg     Exam:  General: Chronically ill-appearing female lying in bed looking weak and tired Eyes: sclera anicteric, conjuctiva mild injection bilaterally CVS: S1-S2, regular  Respiratory:  decreased air entry bilaterally   GI: Scaphoid LE: Decreased muscle mass Neuro: Altered mental status   Data Reviewed:  Basic Metabolic Panel: Recent Labs  Lab 10/22/24 1244 10/23/24 0338 10/24/24 0458  NA 149* 148* 147*  K 3.9 3.1* 3.3*  CL 111 114* 115*  CO2 20* 18* 17*  GLUCOSE 133* 153* 91  BUN 31* 26* 17  CREATININE 0.70 0.62 0.45  CALCIUM 11.0* 9.8 9.5  MG  --   --  2.3  PHOS  --   --  1.4*    CBC: Recent Labs  Lab 10/22/24 1244 10/23/24 0338  WBC 11.2* 10.3  NEUTROABS 10.7*  --   HGB 13.0 11.2*  HCT 38.6 33.6*  MCV 90.2 90.8  PLT 165 137*     Scheduled Meds:  apixaban   5 mg Oral BID   feeding supplement  237 mL Oral TID BM   insulin  aspart  0-5 Units Subcutaneous QHS   insulin  aspart  0-9 Units Subcutaneous TID WC   mirtazapine   15 mg Oral QHS   multivitamin with minerals  1 tablet Oral Daily   OLANZapine   5 mg Oral  QHS   potassium chloride   20 mEq Oral BID   [START ON 10/25/2024] thiamine  100 mg Oral Daily   traZODone   50 mg Oral QHS   Continuous Infusions:  cefTRIAXone  (ROCEPHIN )  IV 2 g (10/23/24 2217)     Assessment & Plan:   Altered mental status Acute metabolic encephalopathy likely secondary to/complicated by E. coli bacteremia and diffusely metastatic lung cancer Baseline mental status is unclear to me Have tried to contact daughter without success Continue ceftriaxone  for treatment of E. coli bacteremia Continue olanzapine  and trazodone   NB: Patient's daughter came to visit and I was able to speak with he--she states that Dr. Nidia at [facility] has told her to start steroids if she gets confused.  On care everywhere, I see Dr. Annamarie note with similar recommendations to start steroids,  presumably dexamethasone  in case of confusion.  Unclear how long she would need to continue the steroids.  Patient has had whole brain radiation in the past.  Given patient's E. coli bacteremia, we will hold off on starting steroids right now.  Would recommend attempt to contact Dr. Nidia tomorrow and consider initiating dexamethasone  per her recommendations tomorrow.   Decreased p.o. intake with trending hypernatremia Hypokalemia Continue D5 1/2 NS with KCL for treatment of modest hypernatremia and hypokalemia  Goals for care Appreciate attempts by palliative care to contact family Patient herself is unable to participate in this discussion at present Patient remains full code but her prognosis seems extremely poor  DM 2 On as needed SSI  History of VTE Continue Eliquis      DVT prophylaxis: Eliquis  Code Status: Full Family Communication: Multiple attempts to contact daughter unsuccessful     Studies: No results found.  Principal Problem:   Sepsis secondary to UTI [facility]) Active Problems:   Protein-calorie malnutrition, severe     Charniece Venturino Tublu Marvene Strohm, [facility]  If 7PM-7AM, please contact night-coverage [email or web address]   LOS: 1 day

## 2024-10-24 NOTE — Progress Notes (Signed)
 If unable to get hold of Doris Johnson the daughter on her cell phone please call [phone] this is an alternate number

## 2024-10-24 NOTE — Progress Notes (Signed)
 PAC accessed per policy.  Tully RN notified and aware to change IV tubings when hanging IVF.

## 2024-10-25 ENCOUNTER — Encounter: Payer: Self-pay | Admitting: Internal Medicine

## 2024-10-25 ENCOUNTER — Inpatient Hospital Stay

## 2024-10-25 DIAGNOSIS — L8990 Pressure ulcer of unspecified site, unspecified stage: Secondary | ICD-10-CM

## 2024-10-25 LAB — GLUCOSE, CAPILLARY
Glucose-Capillary: 113 mg/dL — ABNORMAL HIGH (ref 70–99)
Glucose-Capillary: 161 mg/dL — ABNORMAL HIGH (ref 70–99)
Glucose-Capillary: 130 mg/dL — ABNORMAL HIGH (ref 70–99)
Glucose-Capillary: 143 mg/dL — ABNORMAL HIGH (ref 70–99)
Glucose-Capillary: 65 mg/dL — ABNORMAL LOW (ref 70–99)

## 2024-10-25 LAB — CBC
HCT: 31.2 % — ABNORMAL LOW (ref 36.0–46.0)
Hemoglobin: 10.6 g/dL — ABNORMAL LOW (ref 12.0–15.0)
MCH: 30.3 pg (ref 26.0–34.0)
MCHC: 34 g/dL (ref 30.0–36.0)
MCV: 89.1 fL (ref 80.0–100.0)
Platelets: 91 K/uL — ABNORMAL LOW (ref 150–400)
RBC: 3.5 MIL/uL — ABNORMAL LOW (ref 3.87–5.11)
RDW: 16.8 % — ABNORMAL HIGH (ref 11.5–15.5)
WBC: 3.1 K/uL — ABNORMAL LOW (ref 4.0–10.5)
nRBC: 0 % (ref 0.0–0.2)

## 2024-10-25 LAB — CULTURE, BLOOD (ROUTINE X 2)

## 2024-10-25 LAB — BASIC METABOLIC PANEL WITH GFR
Anion gap: 7 (ref 5–15)
BUN: 14 mg/dL (ref 8–23)
CO2: 22 mmol/L (ref 22–32)
Calcium: 9.5 mg/dL (ref 8.9–10.3)
Chloride: 120 mmol/L — ABNORMAL HIGH (ref 98–111)
Creatinine, Ser: 0.52 mg/dL (ref 0.44–1.00)
GFR, Estimated: >60 mL/min (ref >60)
Glucose, Bld: 117 mg/dL — ABNORMAL HIGH (ref 70–99)
Potassium: 4.2 mmol/L (ref 3.5–5.1)
Sodium: 149 mmol/L — ABNORMAL HIGH (ref 135–145)

## 2024-10-25 LAB — MAGNESIUM: Magnesium: 2 mg/dL (ref 1.7–2.4)

## 2024-10-25 LAB — PHOSPHORUS: Phosphorus: 1.3 mg/dL — ABNORMAL LOW (ref 2.5–4.6)

## 2024-10-25 MED ORDER — DEXAMETHASONE 4 MG PO TABS
4.0000 mg | ORAL_TABLET | Freq: Every day | ORAL | Status: DC
  Administered 2024-10-25 – 2024-10-27 (×3): 4 mg via ORAL
  Filled 2024-10-25 (×4): qty 1

## 2024-10-25 MED ORDER — LORAZEPAM 2 MG/ML IJ SOLN: 1.0000 mg | INTRAMUSCULAR | Status: DC | PRN

## 2024-10-25 MED ORDER — K PHOS MONO-SOD PHOS DI & MONO 155-852-130 MG PO TABS
500.0000 mg | ORAL_TABLET | Freq: Every day | ORAL | Status: AC
  Administered 2024-10-25: 500 mg via ORAL
  Filled 2024-10-25: qty 2

## 2024-10-25 MED ORDER — AMLODIPINE BESYLATE 5 MG PO TABS
5.0000 mg | ORAL_TABLET | Freq: Every day | ORAL | Status: DC
  Administered 2024-10-26 – 2024-11-04 (×7): 5 mg via ORAL
  Filled 2024-10-25 (×9): qty 1

## 2024-10-25 MED ORDER — LACTATED RINGERS IV SOLN: INTRAVENOUS | Status: DC

## 2024-10-25 NOTE — Assessment & Plan Note (Signed)
 MRI of the brain with and without contrast has been ordered Patient may need palliative care consultation

## 2024-10-25 NOTE — Progress Notes (Signed)
 Patient refused to eat any of her meal.

## 2024-10-25 NOTE — Progress Notes (Addendum)
 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  10/22/2024: Presents to the ED for chief concerns of altered mental status.  12/9: Patient admitted to [facility] for chief concerns of altered mental status presumed secondary to UTI.  On admission, patient met sepsis criteria with tachycardia, altered mental status, leukocytosis, elevated lactic acid.  Patient was started on IV ceftriaxone  and resuscitative fluids.  12/10: Blood cultures updated for positive E. coli.  12/11 at 8:45 AM: Noted urine culture updated to greater than 100,000 colonies of E. coli that is pansensitive.  12/12: I assumed care of the patient.  Patient noted to be on D5 half-normal saline with K chloride 40 mill equivalent infusion, started on 12/11 at 1800 hrs. with orders to stop 72 hours after hanging.  MRI of the brain with and without contrast has been ordered.  Dexamethasone  home medication daily resumed in setting of confusion.  Seizure precaution in place.  Ativan  1 mg IV as needed for seizure, 3 doses ordered with instructions to administer as appropriate and then let provider know.  Palliative already consulting.  Assessment & Plan:   Principal Problem:   Sepsis secondary to UTI [facility]) Active Problems:   Hypercholesteremia   Metastasis to brain (HCC)   Protein-calorie malnutrition, severe   Pressure injury of skin   Altered mental status   Assessment and Plan:  * Sepsis secondary to UTI (HCC) On ceftriaxone  2 g IV daily, day 4 of antibiotic  Altered mental status Patient was only able to murmur her first and last name to me today.  She did not tell me where she is and she does not know the current calendar year.  I do not  know patient's baseline mental status Given history of brain metastasis, stroke/worsening metastasis cannot be excluded at this time MRI brain with and without contrast has been ordered  Pressure injury of skin 12/12: This was an epic popup per nursing documentation I was not able to visualize the pressure injury myself as there was no aid to help roll patient over Wound care consult  Metastasis to brain [facility]) MRI of the brain with and without contrast has been ordered Patient may need palliative care consultation  DVT prophylaxis: Apixaban  5 mg p.o. twice daily Code Status: Full code Family Communication: No Disposition Plan: Pending MRI brain without and without contrast.  Palliative care already consulting. Level of care: Telemetry  Consultants:  None at this time  Procedures:  No  Antimicrobials: Set troxidone 2 g IV daily  Subjective:  At bedside, patient was able to tell me her first and last name.  Patient murmurs incoherently.  Baseline mental status is unknown however given brain metastasis, I placed patient on seizure precaution.  As needed Ativan .  MRI brain with and without contrast ordered  Objective: Vitals:   10/24/24 0757 10/24/24 1608 10/24/24 2016 10/25/24 0758  BP: 118/72 93/60 105/63 (!) 105/91  Pulse: (!) 59 100 60 71  Resp: 18 16 16 16   Temp:  97.6 F (36.4 C) 98 F (36.7 C) (!) 97.5 F (36.4 C)  TempSrc:   Oral Oral  SpO2: 100% 91% 100%   Weight:      Height:        Intake/Output Summary (Last 24 hours) at 10/25/2024 1620  Last data filed at 10/25/2024 1300 Gross per 24 hour  Intake 1668.53 ml  Output 500 ml  Net 1168.53 ml   Filed Weights   10/22/24 1238 10/23/24 1617 10/24/24 0445  Weight: 74 kg 61.3 kg 58.6 kg   Examination:  General exam: Appears calm Respiratory system: Clear to auscultation. Respiratory effort normal. Cardiovascular system: S1 & S2 heard, RRR. No JVD, murmurs, rubs, gallops or clicks. No pedal  edema. Gastrointestinal system: Abdomen is nondistended, soft and nontender. No organomegaly or masses felt. Normal bowel sounds heard. Central nervous system: Not alert and oriented.  Extremities: Unable to assess strength Skin: No rashes, lesions or ulcers Psychiatry: Unable to assess judgement and insight .  Unable to assess mood & affect .   Data Reviewed: I have personally reviewed following labs and imaging studies  CBC: Recent Labs  Lab 10/22/24 1244 10/23/24 0338 10/25/24 1147  WBC 11.2* 10.3 3.1*  NEUTROABS 10.7*  --   --   HGB 13.0 11.2* 10.6*  HCT 38.6 33.6* 31.2*  MCV 90.2 90.8 89.1  PLT 165 137* 91*   Basic Metabolic Panel: Recent Labs  Lab 10/22/24 1244 10/23/24 0338 10/24/24 0458 10/25/24 0518  NA 149* 148* 147* 149*  K 3.9 3.1* 3.3* 4.2  CL 111 114* 115* 120*  CO2 20* 18* 17* 22  GLUCOSE 133* 153* 91 117*  BUN 31* 26* 17 14  CREATININE 0.70 0.62 0.45 0.52  CALCIUM 11.0* 9.8 9.5 9.5  MG  --   --  2.3 2.0  PHOS  --   --  1.4* 1.3*   GFR: Estimated Creatinine Clearance: 51 mL/min (by C-G formula based on SCr of 0.52 mg/dL).  Liver Function Tests: Recent Labs  Lab 10/22/24 1244  AST 18  ALT 7  ALKPHOS 92  BILITOT 0.9  PROT 7.7  ALBUMIN 3.6   Coagulation Profile: Recent Labs  Lab 10/22/24 1244  INR 1.5*   CBG: Recent Labs  Lab 10/24/24 2110 10/24/24 2129 10/25/24 0757 10/25/24 1152 10/25/24 1526  GLUCAP 121* 108* 113* 161* 130*   Sepsis Labs: Recent Labs  Lab 10/22/24 1244 10/22/24 1513  LATICACIDVEN 3.1* 2.4*   Recent Results (from the past 240 hours)  Culture, blood (Routine x 2)     Status: Abnormal   Collection Time: 10/22/24 12:44 PM   Specimen: BLOOD  Result Value Ref Range Status   Specimen Description   Final    BLOOD BLOOD LEFT HAND Performed at [facility], [address] [place], [place] [address]    Special Requests   Final    BOTTLES DRAWN AEROBIC AND ANAEROBIC Blood Culture results may not be  optimal due to an inadequate volume of blood received in culture bottles Performed at [facility], [address] [place], [place] [address]    Culture  Setup Time   Final    GRAM NEGATIVE RODS IN BOTH AEROBIC AND ANAEROBIC BOTTLES CRITICAL VALUE NOTED.  VALUE IS CONSISTENT WITH PREVIOUSLY REPORTED AND CALLED VALUE. Performed at [facility], [address] [place], [place] [address]    Culture (A)  Final    ESCHERICHIA COLI SUSCEPTIBILITIES PERFORMED ON PREVIOUS CULTURE WITHIN THE LAST 5 DAYS. Performed at [facility], [address] [place], [place] [address]    Report Status 10/25/2024 FINAL  Final  Culture, blood (Routine x 2)     Status: Abnormal   Collection Time: 10/22/24 12:44 PM   Specimen: Left Antecubital; Blood  Result Value  Ref Range Status   Specimen Description   Final    LEFT ANTECUBITAL Performed at [facility], [address] [place], [place] [address]    Special Requests   Final    BOTTLES DRAWN AEROBIC AND ANAEROBIC Blood Culture results may not be optimal due to an inadequate volume of blood received in culture bottles Performed at [facility], [address], [place], [place] [address]    Culture  Setup Time   Final    GRAM NEGATIVE RODS IN BOTH AEROBIC AND ANAEROBIC BOTTLES CRITICAL RESULT CALLED TO, READ BACK BY AND VERIFIED WITH: NATHAN BELUE PHARMD @0103  10/23/24 ASW Performed at [facility], [address], [place], [place] [address]    Culture ESCHERICHIA COLI (A)  Final   Report Status 10/25/2024 FINAL  Final   Organism ID, Bacteria ESCHERICHIA COLI  Final      Susceptibility   Escherichia coli - MIC*    AMPICILLIN <=2 SENSITIVE Sensitive     CEFAZOLIN  (NON-URINE) <=1 SENSITIVE Sensitive     CEFEPIME  <=0.12 SENSITIVE Sensitive     ERTAPENEM <=0.12 SENSITIVE Sensitive     CEFTRIAXONE  <=0.25 SENSITIVE Sensitive     CIPROFLOXACIN  <=0.06 SENSITIVE Sensitive     GENTAMICIN  <=1 SENSITIVE  Sensitive     MEROPENEM <=0.25 SENSITIVE Sensitive     TRIMETH/SULFA <=20 SENSITIVE Sensitive     AMPICILLIN/SULBACTAM <=2 SENSITIVE Sensitive     PIP/TAZO Value in next row Sensitive      <=4 SENSITIVEThis is a modified FDA-approved test that has been validated and its performance characteristics determined by the reporting laboratory.  This laboratory is certified under the Clinical Laboratory Improvement Amendments CLIA as qualified to perform high complexity clinical laboratory testing.    * ESCHERICHIA COLI  Blood Culture ID Panel (Reflexed)     Status: Abnormal   Collection Time: 10/22/24 12:44 PM  Result Value Ref Range Status   Enterococcus faecalis NOT DETECTED NOT DETECTED Final   Enterococcus Faecium NOT DETECTED NOT DETECTED Final   Listeria monocytogenes NOT DETECTED NOT DETECTED Final   Staphylococcus species NOT DETECTED NOT DETECTED Final   Staphylococcus aureus (BCID) NOT DETECTED NOT DETECTED Final   Staphylococcus epidermidis NOT DETECTED NOT DETECTED Final   Staphylococcus lugdunensis NOT DETECTED NOT DETECTED Final   Streptococcus species NOT DETECTED NOT DETECTED Final   Streptococcus agalactiae NOT DETECTED NOT DETECTED Final   Streptococcus pneumoniae NOT DETECTED NOT DETECTED Final   Streptococcus pyogenes NOT DETECTED NOT DETECTED Final   A.calcoaceticus-baumannii NOT DETECTED NOT DETECTED Final   Bacteroides fragilis NOT DETECTED NOT DETECTED Final   Enterobacterales DETECTED (A) NOT DETECTED Final    Comment: Enterobacterales represent a large order of gram negative bacteria, not a single organism. CRITICAL RESULT CALLED TO, READ BACK BY AND VERIFIED WITH: NATHAN BELUE PHARMD @0103  10/23/24 ASW    Enterobacter cloacae complex NOT DETECTED NOT DETECTED Final   Escherichia coli DETECTED (A) NOT DETECTED Final    Comment: CRITICAL RESULT CALLED TO, READ BACK BY AND VERIFIED WITH: NATHAN BELUE PHARMD @0103  10/23/24 ASW    Klebsiella aerogenes NOT DETECTED NOT  DETECTED Final   Klebsiella oxytoca NOT DETECTED NOT DETECTED Final   Klebsiella pneumoniae NOT DETECTED NOT DETECTED Final   Proteus species NOT DETECTED NOT DETECTED Final   Salmonella species NOT DETECTED NOT DETECTED Final   Serratia marcescens NOT DETECTED NOT DETECTED Final   Haemophilus influenzae NOT DETECTED NOT DETECTED Final   Neisseria meningitidis NOT DETECTED NOT  DETECTED Final   Pseudomonas aeruginosa NOT DETECTED NOT DETECTED Final   Stenotrophomonas maltophilia NOT DETECTED NOT DETECTED Final   Candida albicans NOT DETECTED NOT DETECTED Final   Candida auris NOT DETECTED NOT DETECTED Final   Candida glabrata NOT DETECTED NOT DETECTED Final   Candida krusei NOT DETECTED NOT DETECTED Final   Candida parapsilosis NOT DETECTED NOT DETECTED Final   Candida tropicalis NOT DETECTED NOT DETECTED Final   Cryptococcus neoformans/gattii NOT DETECTED NOT DETECTED Final   CTX-M ESBL NOT DETECTED NOT DETECTED Final   Carbapenem resistance IMP NOT DETECTED NOT DETECTED Final   Carbapenem resistance KPC NOT DETECTED NOT DETECTED Final   Carbapenem resistance NDM NOT DETECTED NOT DETECTED Final   Carbapenem resist OXA 48 LIKE NOT DETECTED NOT DETECTED Final   Carbapenem resistance VIM NOT DETECTED NOT DETECTED Final    Comment: Performed at [facility], [address] [place], [place] [address]  Urine Culture     Status: Abnormal   Collection Time: 10/22/24  1:17 PM   Specimen: Urine, Random  Result Value Ref Range Status   Specimen Description   Final    URINE, RANDOM Performed at [facility], [address] [place], [place] [address]    Special Requests   Final    NONE Reflexed from [id number] Performed at [facility], [address] [place], [place] [address]    Culture >=100,000 COLONIES/mL ESCHERICHIA COLI (A)  Final   Report Status 10/24/2024 FINAL  Final   Organism ID, Bacteria ESCHERICHIA COLI (A)  Final      Susceptibility   Escherichia  coli - MIC*    AMPICILLIN <=2 SENSITIVE Sensitive     CEFAZOLIN  (URINE) Value in next row Sensitive      <=1 SENSITIVEThis is a modified FDA-approved test that has been validated and its performance characteristics determined by the reporting laboratory.  This laboratory is certified under the Clinical Laboratory Improvement Amendments CLIA as qualified to perform high complexity clinical laboratory testing.    CEFEPIME  Value in next row Sensitive      <=1 SENSITIVEThis is a modified FDA-approved test that has been validated and its performance characteristics determined by the reporting laboratory.  This laboratory is certified under the Clinical Laboratory Improvement Amendments CLIA as qualified to perform high complexity clinical laboratory testing.    ERTAPENEM Value in next row Sensitive      <=1 SENSITIVEThis is a modified FDA-approved test that has been validated and its performance characteristics determined by the reporting laboratory.  This laboratory is certified under the Clinical Laboratory Improvement Amendments CLIA as qualified to perform high complexity clinical laboratory testing.    CEFTRIAXONE  Value in next row Sensitive      <=1 SENSITIVEThis is a modified FDA-approved test that has been validated and its performance characteristics determined by the reporting laboratory.  This laboratory is certified under the Clinical Laboratory Improvement Amendments CLIA as qualified to perform high complexity clinical laboratory testing.    CIPROFLOXACIN  Value in next row Sensitive      <=1 SENSITIVEThis is a modified FDA-approved test that has been validated and its performance characteristics determined by the reporting laboratory.  This laboratory is certified under the Clinical Laboratory Improvement Amendments CLIA as qualified to perform high complexity clinical laboratory testing.    GENTAMICIN  Value in next row Sensitive      <=1 SENSITIVEThis is a modified FDA-approved test that has  been validated and its performance characteristics determined by the reporting laboratory.  This laboratory is certified under the Clinical Laboratory Improvement Amendments CLIA as qualified to perform high complexity clinical laboratory testing.    NITROFURANTOIN Value in next row Sensitive      <=1 SENSITIVEThis is a modified FDA-approved test that has been validated and its performance characteristics determined by the reporting laboratory.  This laboratory is certified under the Clinical Laboratory Improvement Amendments CLIA as qualified to perform high complexity clinical laboratory testing.    TRIMETH/SULFA Value in next row Sensitive      <=1 SENSITIVEThis is a modified FDA-approved test that has been validated and its performance characteristics determined by the reporting laboratory.  This laboratory is certified under the Clinical Laboratory Improvement Amendments CLIA as qualified to perform high complexity clinical laboratory testing.    AMPICILLIN/SULBACTAM Value in next row Sensitive      <=1 SENSITIVEThis is a modified FDA-approved test that has been validated and its performance characteristics determined by the reporting laboratory.  This laboratory is certified under the Clinical Laboratory Improvement Amendments CLIA as qualified to perform high complexity clinical laboratory testing.    PIP/TAZO Value in next row Sensitive      <=4 SENSITIVEThis is a modified FDA-approved test that has been validated and its performance characteristics determined by the reporting laboratory.  This laboratory is certified under the Clinical Laboratory Improvement Amendments CLIA as qualified to perform high complexity clinical laboratory testing.    MEROPENEM Value in next row Sensitive      <=4 SENSITIVEThis is a modified FDA-approved test that has been validated and its performance characteristics determined by the reporting laboratory.  This laboratory is certified under the Clinical Laboratory  Improvement Amendments CLIA as qualified to perform high complexity clinical laboratory testing.    * >=100,000 COLONIES/mL ESCHERICHIA COLI    Scheduled Meds:  amLODipine  5 mg Oral Daily   apixaban   5 mg Oral BID   Chlorhexidine  Gluconate Cloth  6 each Topical Daily   dexamethasone   4 mg Oral Daily   feeding supplement  237 mL Oral TID BM   insulin  aspart  0-5 Units Subcutaneous QHS   insulin  aspart  0-9 Units Subcutaneous TID WC   mirtazapine   15 mg Oral QHS   multivitamin with minerals  1 tablet Oral Daily   OLANZapine   5 mg Oral QHS   potassium chloride   40 mEq Oral Once   potassium chloride   20 mEq Oral BID   sodium chloride  flush  10-40 mL Intracatheter Q12H   thiamine  100 mg Oral Daily   traZODone   50 mg Oral QHS   Continuous Infusions:  cefTRIAXone  (ROCEPHIN )  IV 2 g (10/24/24 2210)   dextrose  5 % and 0.45 % NaCl with KCl 40 mEq/L 100 mL/hr at 10/24/24 1712    LOS: 2 days   Time spent: 50 minutes  Dr. Sherre [facility] If 7PM-7AM, please contact night-coverage 10/25/2024, 4:20 PM

## 2024-10-25 NOTE — Assessment & Plan Note (Signed)
 On ceftriaxone  2 g IV daily, day 4 of antibiotic

## 2024-10-25 NOTE — Assessment & Plan Note (Addendum)
 Patient was only able to murmur her first and last name to me today.  She did not tell me where she is and she does not know the current calendar year.  I do not know patient's baseline mental status Given history of brain metastasis, stroke/worsening metastasis cannot be excluded at this time MRI brain with and without contrast has been ordered

## 2024-10-25 NOTE — TOC Initial Note (Signed)
 Transition of Care [facility]) - Initial/Assessment Note    Patient Details  Name: Doris Johnson MRN: [id number] Date of Birth: 1951-11-14  Transition of Care [facility]) CM/SW Contact:    [name], RN Phone Number: 10/25/2024, 9:37 AM  Clinical Narrative:                  Admitted for: AMS Admitted from: with daughter and 2 adult granddaughters PCP: Epifanio Current home health/prior home health/DME: [facility]W  Spoke with daughter Odella.  She states that patient lives with her and her 2 adult daughters, not with patient's brother  She states that patient sleeps on the couch due to the bedrooms being upstairs. Patient has to wear diapers due to incontinence.  She states that patient chemo on 12/8 was held, and next treatment is scheduled for 12/22.    Daughter is aware that palliative is trying to reach her to discuss goals of care.  She is aware that patient is not currently appropriate for rehab.  Will await palliative GOC prior to proceeding with disposition.   Daughter states she works until [employer].  But can be reached by phone on her breaks which are at 9:15 am, 12pm, and 215 pm   Barriers to Discharge: Continued Medical Work up   Patient Goals and CMS Choice            Expected Discharge Plan and Services       Living arrangements for the past 2 months: Apartment                                      Prior Living Arrangements/Services Living arrangements for the past 2 months: Apartment Lives with:: Adult Children              Current home services: DME    Activities of Daily Living      Permission Sought/Granted                  Emotional Assessment              Admission diagnosis:  Lactic acidosis [E87.20] Hypernatremia [E87.0] Sepsis secondary to UTI (HCC) [A41.9, N39.0] Urinary tract infection without hematuria, site unspecified [N39.0] Altered mental status, unspecified altered mental status type [R41.82] Patient Active  Problem List   Diagnosis Date Noted   Protein-calorie malnutrition, severe 10/23/2024   Sepsis secondary to UTI (HCC) 10/22/2024   Metastasis to brain (HCC) 04/04/2024   Chronic venous insufficiency 09/13/2021   Lymphedema 09/13/2021   Pain and swelling of lower leg 09/13/2021   Carotid bruit 09/13/2021   Small cell carcinoma of lung metastatic to liver (HCC) 10/30/2020   Goals of care, counseling/discussion 10/30/2020   Constipation 09/02/2020   Weight loss of more than 10% body weight 09/02/2020   Diabetes mellitus, type 2 (HCC) 01/20/2016   Big thyroid  01/20/2016   Adiposity 01/20/2016   Hypercholesteremia 01/08/2016   Adult BMI 30+ 12/10/2015   BP (high blood pressure) 12/10/2015   Allergic rhinitis 08/10/2015   PCP:  Epifanio Alm [name], MD Pharmacy:   [id number] [place], [place] - [address] AT [facility] [address] [place] [address] Phone: [phone] Fax: [phone]     Social Drivers of Health (SDOH) Social History: SDOH Screenings   Food Insecurity: No Food Insecurity (08/16/2024)   Received from [facility]  Housing: Low Risk  (08/16/2024)   Received from [facility]  Transportation Needs: No Transportation Needs (08/16/2024)   Received from [facility]  Utilities: Not At Risk (08/16/2024)   Received from [facility]  Depression [phone]): Low Risk (07/02/2024)  Financial Resource Strain: Low Risk  (08/16/2024)   Received from [facility]  Tobacco Use: Low Risk (10/22/2024)  Recent Concern: Tobacco Use - Medium Risk (10/07/2024)   Received from [facility]   SDOH Interventions:     Readmission Risk Interventions     No data to display

## 2024-10-25 NOTE — Progress Notes (Signed)
 Palliative Care Progress Note, Assessment & Plan   Patient Name: Doris ALDEN       Date: 10/25/2024 DOB: Jun 14, 1951  Age: 73 y.o. MRN#: [id number] Attending Physician: Sherre Greig SAILOR, DO Primary Care Physician: Epifanio Alm [name], MD Admit Date: 10/22/2024  Subjective: Patient is lying in bed on her right side, asleep.  She easily awakens to my presence but quickly returns to sleep.  She makes no vocalizations during my visit.  No family or friends present at bedside during my visit.  HPI: 73 y.o. female  with past medical history of lung cancer with diffuse metastasis, non-insulin -dependent diabetes mellitus, hypertension, hypothyroidism admitted on 10/22/2024 with AMS and lethargy.   As per chart review, pt presented to ED for evaluation of decreased urine output, lethargy, and altered mentation over the past several days PTA.    Workup revealed blood cultures positive for E. Coli bacteremia. Pt is being treated for E.coli bacteremia, decreased urine output and oral intake, acute metabolic encephalopathy, hypernatremia, and (suspected) severe protein calorie malnutrition.   Summary of counseling/coordination of care: Extensive chart review completed prior to meeting patient including labs, vital signs, imaging, progress notes, orders, and available advanced directive documents from current and previous encounters.   After reviewing the patient's chart and assessing the patient at bedside, patient remains unable to participate in goals of care or medical decision making independently at this time.  After assessing the patient, I spoke with her daughter/HCPOA Odella over the phone in regards to symptom management and goals of care.   Brief medical update given.  Space and opportunity provided for [place]  share thoughts and asked questions regarding her mother's current medical situation.  Odella inquires about whether or not patient has received steroids.  She shares that in the past when her mother has become confused that extra doses of steroids have helped her come around.  Reviewed that her altered mental status is likely connected to her bacteremia which is being addressed by IV antibiotics at this time.   I attempted to elicit values and goals important to patient and [facility].  Odella shares she notes her mother wants to live.  However, she is concerned that her mom is not strong enough to continue with chemotherapy treatments.  Odella shares that she herself canceled her mother's last chemo treatment to give her a break.  When asked to elaborate further, Odella shares she wanted to give her mother a break from treatments in hopes that she can get stronger, eat more, and be able to tolerate future treatments.  Reviewed patient's functional, nutritional, and cognitive status are poor.  Odella shares understanding that her mom is weak but she remains hopeful that she will be able to get stronger to continue treatment.  Discussed best and worst case scenarios of continuing with treatment.  Additionally, discussed shifting to comfort focused care.  He would mortality, limitations of human body, and comfort focused care discussed in detail.  I highlighted the patient created an advance directive earlier this year (October 2025).  Odella shares she is familiar with this documentation.  I discussed that patient does not fit the specific criteria as outlined in her advance directive.  However, I  encouraged Monica to consider DNR/DNI status for her mother understanding evidenced based poor outcomes in similar hospitalized patients, as the cause of the arrest is likely associated with chronic/terminal disease rather than a reversible acute cardio-pulmonary event.  She shares appreciation for this discussion  and shares she would like more time to consider her mother's options.  Full code and full scope remain at this time.   Odella was appreciative of the what if discussion.  She shares she wants to continue with current plan of care at this time.  She does not wish to make any changes to her mothers CODE STATUS or boundaries of medical care.  PMT will continue to follow and support.  I am off service until Monday and plan to follow-up with patient and family at that time.  Should acute palliative needs arise before then, please utilize [facility] for on-call palliative providers.  Physical Exam Vitals reviewed.  Constitutional:      Comments: Thin  HENT:     Head: Normocephalic.  Cardiovascular:     Rate and Rhythm: Normal rate.  Pulmonary:     Effort: Pulmonary effort is normal.  Abdominal:     Palpations: Abdomen is soft.  Skin:    General: Skin is warm and dry.  Psychiatric:        Behavior: Behavior normal.             50 minute visit includes: Detailed review of medical records (labs, imaging, vital signs), medically appropriate exam (mental status, respiratory, cardiac, skin), discussed with treatment team, counseling and educating patient, family and staff, documenting clinical information, medication management and coordination of care.  Lamarr L. Arvid, DNP, FNP-BC Palliative Medicine Team

## 2024-10-25 NOTE — Hospital Course (Addendum)
 Ms. 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  10/22/2024: Presents to the ED for chief concerns of altered mental status.  12/9: Patient admitted to [facility] hospitalist service for chief concerns of altered mental status presumed secondary to UTI.  On admission, patient met sepsis criteria with tachycardia, altered mental status, leukocytosis, elevated lactic acid.  Patient was started on IV ceftriaxone  and resuscitative fluids.  12/10: Blood cultures updated for positive E. coli.  12/11 at 8:45 AM: Noted urine culture updated to greater than 100,000 colonies of E. coli that is pansensitive.  12/12: I assumed care of the patient.  Patient noted to be on D5 half-normal saline with K chloride 40 mill equivalent infusion, started on 12/11 at 1800 hrs. with orders to stop 72 hours after hanging.  MRI of the brain with and without contrast has been ordered.  Dexamethasone  home medication daily resumed in setting of confusion.  Seizure precaution in place.  Ativan  1 mg IV as needed for seizure, 3 doses ordered with instructions to administer as appropriate and then let provider know.  Palliative already consulting.  12/13: Phosphorus chloride 30 mmol IV one-time dose ordered.  Recheck phosphorus level in the AM.  Noted that MRI was not able to be done due to patient altered and not following directions.  Attempted to call daughter, no pickup went directly to voicemail.

## 2024-10-25 NOTE — Assessment & Plan Note (Addendum)
 12/12: This was an epic popup per nursing documentation I was not able to visualize the pressure injury myself as there was no aid to help roll patient over Wound care consult

## 2024-10-25 NOTE — Plan of Care (Signed)
°  Problem: Clinical Measurements: Goal: Signs and symptoms of infection will decrease Outcome: Progressing   Problem: Respiratory: Goal: Ability to maintain adequate ventilation will improve Outcome: Progressing   Problem: Clinical Measurements: Goal: Respiratory complications will improve Outcome: Progressing Goal: Cardiovascular complication will be avoided Outcome: Progressing   Problem: Coping: Goal: Level of anxiety will decrease Outcome: Progressing   Problem: Safety: Goal: Ability to remain free from injury will improve Outcome: Progressing   Problem: Tissue Perfusion: Goal: Adequacy of tissue perfusion will improve Outcome: Progressing

## 2024-10-25 NOTE — Consult Note (Signed)
 WOC Nurse Consult Note: Reason for Consult: pressure injury sacrum  Wound type: 1. Deep Tissue Pressure Injury sacrum purple maroon discoloration  2.  Deep Tissue Pressure Injury coccyx purple maroon discoloration with some dry appearing necrotic tissue  Pressure Injury POA: Yes, placed on flowsheet 12/9  Measurement:see nursing flowsheet  Wound bed: as above  Drainage (amount, consistency, odor) see nursing flowsheet  Periwound: erythema  Dressing procedure/placement/frequency: Cleanse sacral and coccyx wounds with Vashe, do not rinse. Apply Xeroform gauze ([place] [phone]) to wound beds daily and secure with silicone foam.    Reposition patient q2 hours.  POC discussed with bedside nurse. Appreciate K. Rosalio, Rn assistance with this consult. WOC team will not follow. Reconsult if wound bed develops necrotic tissue/worsening. Deep Tissue Pressure injuries are high risk to deteriorate.    Thank you,    Powell Bar MSN, RN-BC, [employer]

## 2024-10-25 NOTE — Progress Notes (Signed)
 Pt came down for MRI scan; pt confused and unable to follow commands. Per RN, family may not want her sedated.  MRI tried contacting daughter with no answer.  Will wait til family is available in the AM to confirm if ok to sedation meds for scan.

## 2024-10-25 NOTE — Progress Notes (Signed)
 Bloodsugar 65, juice given po

## 2024-10-25 NOTE — Care Management Important Message (Signed)
 Important Message  Patient Details  Name: Doris Johnson MRN: [id number] Date of Birth: 1951-10-23   Important Message Given:  Yes - Medicare IM     Kassi Esteve W, CMA 10/25/2024, 2:17 PM

## 2024-10-26 ENCOUNTER — Inpatient Hospital Stay

## 2024-10-26 DIAGNOSIS — R4182 Altered mental status, unspecified: Secondary | ICD-10-CM

## 2024-10-26 DIAGNOSIS — R627 Adult failure to thrive: Secondary | ICD-10-CM | POA: Insufficient documentation

## 2024-10-26 LAB — GLUCOSE, CAPILLARY
Glucose-Capillary: 127 mg/dL — ABNORMAL HIGH (ref 70–99)
Glucose-Capillary: 135 mg/dL — ABNORMAL HIGH (ref 70–99)
Glucose-Capillary: 79 mg/dL (ref 70–99)
Glucose-Capillary: 96 mg/dL (ref 70–99)
Glucose-Capillary: 99 mg/dL (ref 70–99)

## 2024-10-26 LAB — BASIC METABOLIC PANEL WITH GFR
Anion gap: 9 (ref 5–15)
BUN: 12 mg/dL (ref 8–23)
CO2: 23 mmol/L (ref 22–32)
Calcium: 9.5 mg/dL (ref 8.9–10.3)
Chloride: 117 mmol/L — ABNORMAL HIGH (ref 98–111)
Creatinine, Ser: 0.44 mg/dL (ref 0.44–1.00)
GFR, Estimated: >60 mL/min (ref >60)
Glucose, Bld: 106 mg/dL — ABNORMAL HIGH (ref 70–99)
Potassium: 3.8 mmol/L (ref 3.5–5.1)
Sodium: 149 mmol/L — ABNORMAL HIGH (ref 135–145)

## 2024-10-26 LAB — MAGNESIUM: Magnesium: 1.9 mg/dL (ref 1.7–2.4)

## 2024-10-26 LAB — PHOSPHORUS: Phosphorus: 1.5 mg/dL — ABNORMAL LOW (ref 2.5–4.6)

## 2024-10-26 MED ORDER — LORAZEPAM 2 MG/ML IJ SOLN
1.0000 mg | Freq: Once | INTRAMUSCULAR | Status: AC
  Administered 2024-10-26: 1 mg via INTRAVENOUS
  Filled 2024-10-26: qty 1

## 2024-10-26 MED ORDER — GADOBUTROL 1 MMOL/ML IV SOLN
6.0000 mL | Freq: Once | INTRAVENOUS | Status: AC | PRN
  Administered 2024-10-26: 6 mL via INTRAVENOUS

## 2024-10-26 MED ORDER — POTASSIUM PHOSPHATES 15 MMOLE/5ML IV SOLN
30.0000 mmol | Freq: Once | INTRAVENOUS | Status: AC
  Administered 2024-10-26: 30 mmol via INTRAVENOUS
  Filled 2024-10-26: qty 10

## 2024-10-26 NOTE — Assessment & Plan Note (Signed)
 Potassium phosphate  30 mmol and dextrose  one-time dose ordered Recheck phosphorus in a.m.

## 2024-10-26 NOTE — Assessment & Plan Note (Signed)
 I attempted to feed patient at bedside. Patient refuses to eat.

## 2024-10-26 NOTE — Plan of Care (Signed)

## 2024-10-26 NOTE — Progress Notes (Addendum)
 Palliative Care Progress Note, Assessment & Plan   Patient Name: Doris Johnson       Date: 10/26/2024 DOB: December 18, 1950  Age: 73 y.o. MRN#: [id number] Attending Physician: Sherre Greig SAILOR, DO Primary Care Physician: Epifanio Alm [name], MD Admit Date: 10/22/2024  Subjective: Unable to assess  HPI: 73 y.o. female  with past medical history of lung cancer with diffuse metastasis, non-insulin -dependent diabetes mellitus, hypertension, hypothyroidism admitted on 10/22/2024 with AMS and lethargy.   As per chart review, pt presented to ED for evaluation of decreased urine output, lethargy, and altered mentation over the past several days PTA.    Workup revealed blood cultures positive for E. Coli bacteremia. Pt is being treated for E.coli bacteremia, decreased urine output and oral intake, acute metabolic encephalopathy, hypernatremia, and (suspected) severe protein calorie malnutrition.   Palliative team consulted to assist with goals of care conversations.   Summary of counseling/coordination of care: Extensive chart review completed prior to meeting patient including labs, vital signs, imaging, progress notes, orders, and available advanced directive documents from current and previous encounters.   After reviewing the patient's chart, patient was assessed at bedside.       Latest Ref Rng & Units 10/25/2024   11:47 AM 10/23/2024    3:38 AM 10/22/2024   12:44 PM  CBC  WBC 4.0 - 10.5 K/uL 3.1  10.3  11.2   Hemoglobin 12.0 - 15.0 g/dL 89.3  88.7  86.9   Hematocrit 36.0 - 46.0 % 31.2  33.6  38.6   Platelets 150 - 400 K/uL 91  137  165       Latest Ref Rng & Units 10/26/2024    3:45 AM 10/25/2024    5:18 AM 10/24/2024    4:58 AM  CMP  Glucose 70 - 99 mg/dL 893  882  91   BUN 8 - 23 mg/dL 12  14  17     Creatinine 0.44 - 1.00 mg/dL 9.55  9.47  9.54   Sodium 135 - 145 mmol/L 149  149  147   Potassium 3.5 - 5.1 mmol/L 3.8  4.2  3.3   Chloride 98 - 111 mmol/L 117  120  115   CO2 22 - 32 mmol/L 23  22  17    Calcium 8.9 - 10.3 mg/dL 9.5  9.5  9.5      Chronically, ill-appearing female resting in bed. She does not awaken to verbal or tactile stimuli, only moans once and immediately falls back to sleep. She is not able to participate in conversation. Respirations are even and unlabored. She is in no distress. No family or visitors at bedside.    Physical Exam Vitals reviewed.  Constitutional:      General: She is not in acute distress.    Appearance: She is ill-appearing.     Comments: Thin, frail  HENT:     Head: Normocephalic and atraumatic.  Pulmonary:     Effort: Pulmonary effort is normal. No respiratory distress.  Musculoskeletal:     Right lower leg: No edema.     Left lower leg: No edema.  Neurological:     Mental Status: She is lethargic.    Recommendations/Plan: FULL CODE  status as previously documented    Continue current supportive interventions PMT will follow for continuing GOC conversations Disposition TBD       Total Time 50 minutes   Time spent includes: Detailed review of medical records (labs, imaging, vital signs), medically appropriate exam (mental status, respiratory, cardiac, skin), discussed with treatment team, counseling and educating patient, family and staff, documenting clinical information, medication management and coordination of care.     Devere Sacks, AMANDA [facility] Palliative Medicine Team  10/26/2024 8:46 AM  Office [phone]  Pager [phone]

## 2024-10-26 NOTE — TOC Progression Note (Signed)
 Transition of Care [facility]) - Progression Note    Patient Details  Name: Doris Johnson MRN: [id number] Date of Birth: 10-05-1951  [facility] CM/SW Contact  [name], RN Phone Number: 10/26/2024, 11:51 AM  Clinical Narrative:    11:34 am: [facility] received a messaged via secure chat from Saddie Na with [facility] informing me that the patient's family reached out to her to evaluate the patient and wanted to make sure it was okay to proceed with referral. RNCM gave her let her know that it was okay to proceed with referral. Saddie is scheduled to see evaluate the patient Sunday, 10/27/2024 @ 11am. RNCM will continue to follow patient for any discharge needs.      Barriers to Discharge: Continued Medical Work up               Expected Discharge Plan and Services       Living arrangements for the past 2 months: Apartment                                       Social Drivers of Health (SDOH) Interventions SDOH Screenings   Food Insecurity: No Food Insecurity (08/16/2024)   Received from [facility]  Housing: Low Risk  (08/16/2024)   Received from [facility]  Transportation Needs: No Transportation Needs (08/16/2024)   Received from [facility]  Utilities: Not At Risk (08/16/2024)   Received from [facility]  Depression [phone]): Low Risk (07/02/2024)  Financial Resource Strain: Low Risk  (08/16/2024)   [facility]  Tobacco Use: Low Risk (10/25/2024)  Recent Concern: Tobacco Use - Medium Risk (10/07/2024)   Received from [facility]    Readmission Risk Interventions    10/25/2024    9:42 AM  Readmission Risk Prevention Plan  Transportation Screening Complete  PCP or Specialist Appt within 3-5 Days Complete  Palliative Care Screening Complete  Medication Review (RN Care Manager) Complete

## 2024-10-26 NOTE — Progress Notes (Signed)
 PROGRESS NOTE  Doris Johnson  FMW:[id number] DOB: 1951/09/07 DOA: 10/22/2024 PCP: Epifanio Alm [name], MD   Ms. 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  10/22/2024: Presents to the ED for chief concerns of altered mental status.  12/9: Patient admitted to [facility] for chief concerns of altered mental status presumed secondary to UTI.  On admission, patient met sepsis criteria with tachycardia, altered mental status, leukocytosis, elevated lactic acid.  Patient was started on IV ceftriaxone  and resuscitative fluids.  12/10: Blood cultures updated for positive E. coli.  12/11 at 8:45 AM: Noted urine culture updated to greater than 100,000 colonies of E. coli that is pansensitive.  12/12: I assumed care of the patient.  Patient noted to be on D5 half-normal saline with K chloride 40 mill equivalent infusion, started on 12/11 at 1800 hrs. with orders to stop 72 hours after hanging.  MRI of the brain with and without contrast has been ordered.  Dexamethasone  home medication daily resumed in setting of confusion.  Seizure precaution in place.  Ativan  1 mg IV as needed for seizure, 3 doses ordered with instructions to administer as appropriate and then let provider know.  Palliative already consulting.  12/13: Phosphorus chloride 30 mmol IV one-time dose ordered.  Recheck phosphorus level in the AM.  Noted that MRI was not able to be done due to patient altered and not following directions.  Attempted to call daughter, no pickup went directly to voicemail.  Assessment & Plan:   Principal Problem:   Altered mental status Active Problems:   Protein-calorie malnutrition, severe   Hypophosphatemia   Pressure injury of skin   Hypercholesteremia   Metastasis to brain (HCC)   Sepsis secondary to UTI (HCC)   Failure to thrive  in adult   Assessment and Plan:  * Altered mental status Patient was only able to murmur her first and last name to me today.  She did not tell me where she is and she does not know the current calendar year.  I do not know patient's baseline mental status Given history of brain metastasis, stroke/worsening metastasis cannot be excluded at this time MRI brain with and without contrast has been ordered  Hypophosphatemia Potassium phosphate  30 mmol and dextrose  one-time dose ordered Recheck phosphorus in a.m.  Protein-calorie malnutrition, severe I attempted to feed patient at bedside. Patient refuses to eat.   Pressure injury of skin 12/12: This was an epic popup per nursing documentation I was not able to visualize the pressure injury myself as there was no aid to help roll patient over Wound care consult  Failure to thrive in adult Palliative, registered dietitian has been consulted.  Received message from Saddie Na of [facility] informing me that the patient's family reached out to her to evaluate the patient and wanted to make sure it was okay to proceed with referral.  RNCM said it was okay to proceed with referral.  Saddie is scheduling a patient evaluation on 10/27/2024 at 11 AM.  Sepsis secondary to UTI (HCC) On ceftriaxone  2 g IV daily, day 4 of antibiotic  Metastasis to brain [facility]) MRI of the brain with and without contrast has been ordered Patient may need palliative care consultation  DVT prophylaxis: Apixaban  Code Status: Full code Family Communication: Attempted to call daughter at [phone], phone went directly to voicemail.  I did not leave a message as the  voicemail did not indicate a name. Disposition Plan: Poor prognosis Level of care: Telemetry  Consultants:  TOC, palliative  Procedures:  None  Antimicrobials: 12/13: Day 5 of ceftriaxone  2 g IV daily  Subjective:  At bedside, patient murmurs incoherently.  Patient moves her extremities  though patient does not follow command.  I attempted to feed patient vanilla ice cream at bedside.  Patient looks at me and states: 'I just can't'.  Patient opens her eyes spontaneously when I call out to her.  She just rolls over to her left side and closes her eyes.  Objective: Vitals:   10/25/24 2048 10/26/24 0414 10/26/24 0508 10/26/24 0851  BP: 91/72 128/80  128/72  Pulse: 68 (!) 59  (!) 58  Resp: 17 17  15   Temp: 97.9 F (36.6 C) (!) 97.5 F (36.4 C)    TempSrc:      SpO2: 99% 100%    Weight:   61.8 kg   Height:        Intake/Output Summary (Last 24 hours) at 10/26/2024 1524 Last data filed at 10/26/2024 0509 Gross per 24 hour  Intake 100 ml  Output 500 ml  Net -400 ml   Filed Weights   10/23/24 1617 10/24/24 0445 10/26/24 0508  Weight: 61.3 kg 58.6 kg 61.8 kg   Examination:  General exam: Appears frail, weak hectic, calm. Respiratory system: Clear to auscultation. Respiratory effort normal. Cardiovascular system: S1 & S2 heard, RRR. No JVD, murmurs, rubs, gallops or clicks. No pedal edema. Gastrointestinal system: Abdomen is nondistended, soft and nontender. No organomegaly or masses felt. Normal bowel sounds heard. Central nervous system: Send is awake though not alert and not oriented.  Mental status baseline is unknown. Extremities: Patient moves all extremities Skin: Unable to assess sacral skin.  No ulcer noted on visual skin. Psychiatry: Unable to assess judgment and insight, mood, affect.   Data Reviewed: I have personally reviewed following labs and imaging studies  CBC: Recent Labs  Lab 10/22/24 1244 10/23/24 0338 10/25/24 1147  WBC 11.2* 10.3 3.1*  NEUTROABS 10.7*  --   --   HGB 13.0 11.2* 10.6*  HCT 38.6 33.6* 31.2*  MCV 90.2 90.8 89.1  PLT 165 137* 91*   Basic Metabolic Panel: Recent Labs  Lab 10/22/24 1244 10/23/24 0338 10/24/24 0458 10/25/24 0518 10/26/24 0345  NA 149* 148* 147* 149* 149*  K 3.9 3.1* 3.3* 4.2 3.8  CL 111 114* 115*  120* 117*  CO2 20* 18* 17* 22 23  GLUCOSE 133* 153* 91 117* 106*  BUN 31* 26* 17 14 12   CREATININE 0.70 0.62 0.45 0.52 0.44  CALCIUM 11.0* 9.8 9.5 9.5 9.5  MG  --   --  2.3 2.0 1.9  PHOS  --   --  1.4* 1.3* 1.5*   GFR: Estimated Creatinine Clearance: 51 mL/min (by C-G formula based on SCr of 0.44 mg/dL).  Liver Function Tests: Recent Labs  Lab 10/22/24 1244  AST 18  ALT 7  ALKPHOS 92  BILITOT 0.9  PROT 7.7  ALBUMIN 3.6   Coagulation Profile: Recent Labs  Lab 10/22/24 1244  INR 1.5*   CBG: Recent Labs  Lab 10/25/24 1716 10/25/24 2221 10/26/24 0014 10/26/24 0842 10/26/24 1141  GLUCAP 143* 65* 79 99 96   Sepsis Labs: Recent Labs  Lab 10/22/24 1244 10/22/24 1513  LATICACIDVEN 3.1* 2.4*   Recent Results (from the past 240 hours)  Culture, blood (Routine x 2)     Status: Abnormal  Collection Time: 10/22/24 12:44 PM   Specimen: BLOOD  Result Value Ref Range Status   Specimen Description   Final    BLOOD BLOOD LEFT HAND Performed at [facility], [address] [place], [place] [address]    Special Requests   Final    BOTTLES DRAWN AEROBIC AND ANAEROBIC Blood Culture results may not be optimal due to an inadequate volume of blood received in culture bottles Performed at [facility], [address], [place], [place] [address]    Culture  Setup Time   Final    GRAM NEGATIVE RODS IN BOTH AEROBIC AND ANAEROBIC BOTTLES CRITICAL VALUE NOTED.  VALUE IS CONSISTENT WITH PREVIOUSLY REPORTED AND CALLED VALUE. Performed at [facility], [address] [place], [place] [address]    Culture (A)  Final    ESCHERICHIA COLI SUSCEPTIBILITIES PERFORMED ON PREVIOUS CULTURE WITHIN THE LAST 5 DAYS. Performed at [facility], [address] [place], [place] [address]    Report Status 10/25/2024 FINAL  Final  Culture, blood (Routine x 2)     Status: Abnormal   Collection Time: 10/22/24 12:44 PM   Specimen: Left Antecubital; Blood   Result Value Ref Range Status   Specimen Description   Final    LEFT ANTECUBITAL Performed at [facility], [address] [place], [place] [address]    Special Requests   Final    BOTTLES DRAWN AEROBIC AND ANAEROBIC Blood Culture results may not be optimal due to an inadequate volume of blood received in culture bottles Performed at [facility], [address] [place], [place] [address]    Culture  Setup Time   Final    GRAM NEGATIVE RODS IN BOTH AEROBIC AND ANAEROBIC BOTTLES CRITICAL RESULT CALLED TO, READ BACK BY AND VERIFIED WITH: NATHAN BELUE PHARMD @0103  10/23/24 ASW Performed at [facility], [address], [place], [place] [address]    Culture ESCHERICHIA COLI (A)  Final   Report Status 10/25/2024 FINAL  Final   Organism ID, Bacteria ESCHERICHIA COLI  Final      Susceptibility   Escherichia coli - MIC*    AMPICILLIN <=2 SENSITIVE Sensitive     CEFAZOLIN  (NON-URINE) <=1 SENSITIVE Sensitive     CEFEPIME  <=0.12 SENSITIVE Sensitive     ERTAPENEM <=0.12 SENSITIVE Sensitive     CEFTRIAXONE  <=0.25 SENSITIVE Sensitive     CIPROFLOXACIN  <=0.06 SENSITIVE Sensitive     GENTAMICIN  <=1 SENSITIVE Sensitive     MEROPENEM <=0.25 SENSITIVE Sensitive     TRIMETH/SULFA <=20 SENSITIVE Sensitive     AMPICILLIN/SULBACTAM <=2 SENSITIVE Sensitive     PIP/TAZO Value in next row Sensitive      <=4 SENSITIVEThis is a modified FDA-approved test that has been validated and its performance characteristics determined by the reporting laboratory.  This laboratory is certified under the Clinical Laboratory Improvement Amendments CLIA as qualified to perform high complexity clinical laboratory testing.    * ESCHERICHIA COLI  Blood Culture ID Panel (Reflexed)     Status: Abnormal   Collection Time: 10/22/24 12:44 PM  Result Value Ref Range Status   Enterococcus faecalis NOT DETECTED NOT DETECTED Final   Enterococcus Faecium NOT DETECTED NOT DETECTED Final   Listeria  monocytogenes NOT DETECTED NOT DETECTED Final   Staphylococcus species NOT DETECTED NOT DETECTED Final   Staphylococcus aureus (BCID) NOT DETECTED NOT DETECTED Final   Staphylococcus epidermidis NOT DETECTED NOT DETECTED Final   Staphylococcus lugdunensis NOT DETECTED NOT DETECTED  Final   Streptococcus species NOT DETECTED NOT DETECTED Final   Streptococcus agalactiae NOT DETECTED NOT DETECTED Final   Streptococcus pneumoniae NOT DETECTED NOT DETECTED Final   Streptococcus pyogenes NOT DETECTED NOT DETECTED Final   A.calcoaceticus-baumannii NOT DETECTED NOT DETECTED Final   Bacteroides fragilis NOT DETECTED NOT DETECTED Final   Enterobacterales DETECTED (A) NOT DETECTED Final    Comment: Enterobacterales represent a large order of gram negative bacteria, not a single organism. CRITICAL RESULT CALLED TO, READ BACK BY AND VERIFIED WITH: NATHAN BELUE PHARMD @0103  10/23/24 ASW    Enterobacter cloacae complex NOT DETECTED NOT DETECTED Final   Escherichia coli DETECTED (A) NOT DETECTED Final    Comment: CRITICAL RESULT CALLED TO, READ BACK BY AND VERIFIED WITH: NATHAN BELUE PHARMD @0103  10/23/24 ASW    Klebsiella aerogenes NOT DETECTED NOT DETECTED Final   Klebsiella oxytoca NOT DETECTED NOT DETECTED Final   Klebsiella pneumoniae NOT DETECTED NOT DETECTED Final   Proteus species NOT DETECTED NOT DETECTED Final   Salmonella species NOT DETECTED NOT DETECTED Final   Serratia marcescens NOT DETECTED NOT DETECTED Final   Haemophilus influenzae NOT DETECTED NOT DETECTED Final   Neisseria meningitidis NOT DETECTED NOT DETECTED Final   Pseudomonas aeruginosa NOT DETECTED NOT DETECTED Final   Stenotrophomonas maltophilia NOT DETECTED NOT DETECTED Final   Candida albicans NOT DETECTED NOT DETECTED Final   Candida auris NOT DETECTED NOT DETECTED Final   Candida glabrata NOT DETECTED NOT DETECTED Final   Candida krusei NOT DETECTED NOT DETECTED Final   Candida parapsilosis NOT DETECTED NOT DETECTED  Final   Candida tropicalis NOT DETECTED NOT DETECTED Final   Cryptococcus neoformans/gattii NOT DETECTED NOT DETECTED Final   CTX-M ESBL NOT DETECTED NOT DETECTED Final   Carbapenem resistance IMP NOT DETECTED NOT DETECTED Final   Carbapenem resistance KPC NOT DETECTED NOT DETECTED Final   Carbapenem resistance NDM NOT DETECTED NOT DETECTED Final   Carbapenem resist OXA 48 LIKE NOT DETECTED NOT DETECTED Final   Carbapenem resistance VIM NOT DETECTED NOT DETECTED Final    Comment: Performed at [facility], [address], [place], [place] [address]  Urine Culture     Status: Abnormal   Collection Time: 10/22/24  1:17 PM   Specimen: Urine, Random  Result Value Ref Range Status   Specimen Description   Final    URINE, RANDOM Performed at [facility], [address] [place], [place] [address]    Special Requests   Final    NONE Reflexed from [id number] Performed at [facility], [address] [place], [place] [address]    Culture >=100,000 COLONIES/mL ESCHERICHIA COLI (A)  Final   Report Status 10/24/2024 FINAL  Final   Organism ID, Bacteria ESCHERICHIA COLI (A)  Final      Susceptibility   Escherichia coli - MIC*    AMPICILLIN <=2 SENSITIVE Sensitive     CEFAZOLIN  (URINE) Value in next row Sensitive      <=1 SENSITIVEThis is a modified FDA-approved test that has been validated and its performance characteristics determined by the reporting laboratory.  This laboratory is certified under the Clinical Laboratory Improvement Amendments CLIA as qualified to perform high complexity clinical laboratory testing.    CEFEPIME  Value in next row Sensitive      <=1 SENSITIVEThis is a modified FDA-approved test that has been validated and its performance characteristics determined by the reporting laboratory.  This laboratory is certified under the Clinical Laboratory Improvement Amendments CLIA as qualified to  perform high complexity clinical laboratory testing.     ERTAPENEM Value in next row Sensitive      <=1 SENSITIVEThis is a modified FDA-approved test that has been validated and its performance characteristics determined by the reporting laboratory.  This laboratory is certified under the Clinical Laboratory Improvement Amendments CLIA as qualified to perform high complexity clinical laboratory testing.    CEFTRIAXONE  Value in next row Sensitive      <=1 SENSITIVEThis is a modified FDA-approved test that has been validated and its performance characteristics determined by the reporting laboratory.  This laboratory is certified under the Clinical Laboratory Improvement Amendments CLIA as qualified to perform high complexity clinical laboratory testing.    CIPROFLOXACIN  Value in next row Sensitive      <=1 SENSITIVEThis is a modified FDA-approved test that has been validated and its performance characteristics determined by the reporting laboratory.  This laboratory is certified under the Clinical Laboratory Improvement Amendments CLIA as qualified to perform high complexity clinical laboratory testing.    GENTAMICIN  Value in next row Sensitive      <=1 SENSITIVEThis is a modified FDA-approved test that has been validated and its performance characteristics determined by the reporting laboratory.  This laboratory is certified under the Clinical Laboratory Improvement Amendments CLIA as qualified to perform high complexity clinical laboratory testing.    NITROFURANTOIN Value in next row Sensitive      <=1 SENSITIVEThis is a modified FDA-approved test that has been validated and its performance characteristics determined by the reporting laboratory.  This laboratory is certified under the Clinical Laboratory Improvement Amendments CLIA as qualified to perform high complexity clinical laboratory testing.    TRIMETH/SULFA Value in next row Sensitive      <=1 SENSITIVEThis is a modified FDA-approved test that has been validated and its performance characteristics  determined by the reporting laboratory.  This laboratory is certified under the Clinical Laboratory Improvement Amendments CLIA as qualified to perform high complexity clinical laboratory testing.    AMPICILLIN/SULBACTAM Value in next row Sensitive      <=1 SENSITIVEThis is a modified FDA-approved test that has been validated and its performance characteristics determined by the reporting laboratory.  This laboratory is certified under the Clinical Laboratory Improvement Amendments CLIA as qualified to perform high complexity clinical laboratory testing.    PIP/TAZO Value in next row Sensitive      <=4 SENSITIVEThis is a modified FDA-approved test that has been validated and its performance characteristics determined by the reporting laboratory.  This laboratory is certified under the Clinical Laboratory Improvement Amendments CLIA as qualified to perform high complexity clinical laboratory testing.    MEROPENEM Value in next row Sensitive      <=4 SENSITIVEThis is a modified FDA-approved test that has been validated and its performance characteristics determined by the reporting laboratory.  This laboratory is certified under the Clinical Laboratory Improvement Amendments CLIA as qualified to perform high complexity clinical laboratory testing.    * >=100,000 COLONIES/mL ESCHERICHIA COLI     Radiology Studies: DG Abd Portable 1V Result Date: 10/25/2024 EXAM: 1 VIEW XRAY OF THE ABDOMEN 10/25/2024 09:43:03 PM COMPARISON: None available. CLINICAL HISTORY: Mental status change, unknown cause; Neuro deficit, acute, stroke suspected; [phone] Encounter for imaging to screen for metal prior to magnetic resonance imaging (MRI) [id number]; Striae of brain metastasis FINDINGS: BOWEL: Nonobstructive bowel gas pattern. SOFT TISSUES: No radiopaque foreign body is seen. No abnormal calcifications. BONES: No acute fracture. IMPRESSION: 1. No radiopaque foreign body. Electronically signed by: Sriyesh Krishnan  MD 10/25/2024  09:46 PM EST RP Workstation: [id number]   Scheduled Meds:  amLODipine   5 mg Oral Daily   apixaban   5 mg Oral BID   Chlorhexidine  Gluconate Cloth  6 each Topical Daily   dexamethasone   4 mg Oral Daily   feeding supplement  237 mL Oral TID BM   insulin  aspart  0-5 Units Subcutaneous QHS   insulin  aspart  0-9 Units Subcutaneous TID WC   mirtazapine   15 mg Oral QHS   multivitamin with minerals  1 tablet Oral Daily   OLANZapine   5 mg Oral QHS   sodium chloride  flush  10-40 mL Intracatheter Q12H   thiamine   100 mg Oral Daily   traZODone   50 mg Oral QHS   Continuous Infusions:  cefTRIAXone  (ROCEPHIN )  IV Stopped (10/25/24 2358)   potassium PHOSPHATE  IVPB (in mmol) 30 mmol (10/26/24 1128)    LOS: 3 days   Time spent: 52 minutes  Dr. Sherre [facility] If 7PM-7AM, please contact night-coverage 10/26/2024, 3:24 PM

## 2024-10-26 NOTE — Assessment & Plan Note (Addendum)
 Palliative, registered dietitian has been consulted.  Received message from Saddie Na of [facility] informing me that the patient's family reached out to her to evaluate the patient and wanted to make sure it was okay to proceed with referral.  RNCM said it was okay to proceed with referral.  Saddie is scheduling a patient evaluation on 10/27/2024 at 11 AM.

## 2024-10-27 LAB — CBC
HCT: 28.8 % — ABNORMAL LOW (ref 36.0–46.0)
Hemoglobin: 9.5 g/dL — ABNORMAL LOW (ref 12.0–15.0)
MCH: 29.5 pg (ref 26.0–34.0)
MCHC: 33 g/dL (ref 30.0–36.0)
MCV: 89.4 fL (ref 80.0–100.0)
Platelets: 101 K/uL — ABNORMAL LOW (ref 150–400)
RBC: 3.22 MIL/uL — ABNORMAL LOW (ref 3.87–5.11)
RDW: 16.8 % — ABNORMAL HIGH (ref 11.5–15.5)
WBC: 3.8 K/uL — ABNORMAL LOW (ref 4.0–10.5)
nRBC: 0 % (ref 0.0–0.2)

## 2024-10-27 LAB — GLUCOSE, CAPILLARY
Glucose-Capillary: 87 mg/dL (ref 70–99)
Glucose-Capillary: 108 mg/dL — ABNORMAL HIGH (ref 70–99)
Glucose-Capillary: 177 mg/dL — ABNORMAL HIGH (ref 70–99)
Glucose-Capillary: 147 mg/dL — ABNORMAL HIGH (ref 70–99)

## 2024-10-27 LAB — BASIC METABOLIC PANEL WITH GFR
Anion gap: 8 (ref 5–15)
BUN: 14 mg/dL (ref 8–23)
CO2: 24 mmol/L (ref 22–32)
Calcium: 9.6 mg/dL (ref 8.9–10.3)
Chloride: 115 mmol/L — ABNORMAL HIGH (ref 98–111)
Creatinine, Ser: 0.4 mg/dL — ABNORMAL LOW (ref 0.44–1.00)
GFR, Estimated: >60 mL/min (ref >60)
Glucose, Bld: 100 mg/dL — ABNORMAL HIGH (ref 70–99)
Potassium: 3.4 mmol/L — ABNORMAL LOW (ref 3.5–5.1)
Sodium: 147 mmol/L — ABNORMAL HIGH (ref 135–145)

## 2024-10-27 LAB — MAGNESIUM: Magnesium: 1.9 mg/dL (ref 1.7–2.4)

## 2024-10-27 LAB — SODIUM: Sodium: 127 mmol/L — ABNORMAL LOW (ref 135–145)

## 2024-10-27 LAB — PHOSPHORUS: Phosphorus: 2.4 mg/dL — ABNORMAL LOW (ref 2.5–4.6)

## 2024-10-27 MED ORDER — DEXTROSE 5 % IV SOLN: INTRAVENOUS | Status: DC

## 2024-10-27 MED ORDER — POTASSIUM PHOSPHATES 15 MMOLE/5ML IV SOLN
30.0000 mmol | Freq: Once | INTRAVENOUS | Status: AC
  Administered 2024-10-27: 30 mmol via INTRAVENOUS
  Filled 2024-10-27: qty 10

## 2024-10-27 NOTE — Progress Notes (Signed)
 Civil Engineer, [employer]) Liaison Note  ACC received a call yesterday from the patient's brother, Doris Johnson wanting to understand more about hospice services.  He said he niece, Doris Johnson was HCPOA and they would like a joint visit to understand what hospice was and what hospice provided.  I met at the bedside with patient, Doris Johnson and Doris Johnson to initiate education related to hospice philosophy, multidisciplinary care and comfort approach.  They were confused about all the people calling them and meeting with them.  I gave them clarification of the roles of people they know they have talked to.  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  The weekend staff is new to them and added to the confusion.    After offering education regarding the hospice Medicare Benefit- they are not ready to choose a comfort path at this time and would still like to continue with treatment.  Informed [name], RN/CM and hospital medical team of the information above.  Please do not hesitate to call for any hospice related questions or concerns.  Please refer if patient/family request further assistance or education related to hospice.  [name], MA, BSN, RN, FNE Nurse Liaison  [phone]

## 2024-10-27 NOTE — Progress Notes (Signed)
 PT Cancellation Note  Patient Details Name: ESMERELDA FINNIGAN MRN: [id number] DOB: 1950/12/04   Cancelled Treatment:    Reason Eval/Treat Not Completed: Other (comment)  MD and palliative in with pt and family.  Will continue as appropriate at a later time/date.   Lauraine Gills 10/27/2024, 12:32 PM

## 2024-10-27 NOTE — TOC Progression Note (Addendum)
 Transition of Care [name]) - Progression Note    Patient Details  Name: Doris Johnson MRN: [id number] Date of Birth: 07-14-1951  [name]) CM/SW Contact  [name], RN Phone Number: 10/27/2024, 2:08 PM  Clinical Narrative:   RNCM met with patient, brother and daughter at bedside. RNCM introduced role and explained that discharge planning would be discussed. Family wants to take patient home with Home Health with RN/Aide and SW. They need a hospital bed secondary to the patient sleeping on the couch right now because she can't walk up the stairs. MD  made aware. The patient is not a candidate for hospice because she is still receiving medical treatment with her oncologist at [name]. Family not familiar with the agencies in the area and ask that RNCM submit referral's to Home Health Agencies in the area. [name] referrals submitted. Family advised that RNCM will review HH offers once received and hospital bed will be ordered once the patient is medically stable for discharge. They verbalized understanding. RNCM will continue to follow for discharge planning needs.   3:00pm: Spoke to patient's daughter and informed her that [facility] had accepted her mom for [name] services requested and she is in agreement using [facility]. Advise that they will follow patient when discharged.     Barriers to Discharge: Continued Medical Work up               Expected Discharge Plan and Services       Living arrangements for the past 2 months: Apartment                                       Social Drivers of Health (SDOH) Interventions SDOH Screenings   Food Insecurity: No Food Insecurity (08/16/2024)   Received from [facility]  Housing: Low Risk  (08/16/2024)   Received from [facility]  Transportation Needs: No Transportation Needs (08/16/2024)   Received from [facility]  Utilities: Not At Risk (08/16/2024)    Received from [facility]  Depression [phone]): Low Risk (07/02/2024)  Financial Resource Strain: Low Risk  (08/16/2024)   Received from [facility]  Tobacco Use: Low Risk (10/25/2024)  Recent Concern: Tobacco Use - Medium Risk (10/07/2024)   Received from [facility]    Readmission Risk Interventions    10/25/2024    9:42 AM  Readmission Risk Prevention Plan  Transportation Screening Complete  PCP or Specialist Appt within 3-5 Days Complete  Palliative Care Screening Complete  Medication Review (RN Care Manager) Complete

## 2024-10-27 NOTE — Progress Notes (Addendum)
 Palliative Care Progress Note, Assessment & Plan   Patient Name: Doris Johnson       Date: 10/27/2024 DOB: 1951/10/31  Age: 73 y.o. MRN#: [id number] Attending Physician: Trudy Anthony HERO, MD Primary Care Physician: Epifanio Alm [name], MD Admit Date: 10/22/2024  Subjective: Feels better today. Denies pain. Drank orange juice for breakfast.   HPI: 73 y.o. female  with past medical history of lung cancer with diffuse metastasis, non-insulin -dependent diabetes mellitus, hypertension, hypothyroidism admitted on 10/22/2024 with AMS and lethargy.   As per chart review, pt presented to ED for evaluation of decreased urine output, lethargy, and altered mentation over the past several days PTA.    Workup revealed blood cultures positive for E. Coli bacteremia. Pt is being treated for E.coli bacteremia, decreased urine output and oral intake, acute metabolic encephalopathy, hypernatremia, and (suspected) severe protein calorie malnutrition.    Palliative team consulted to assist with goals of care conversations.  Summary of counseling/coordination of care: Extensive chart review completed prior to meeting patient including labs, vital signs, imaging, progress notes, orders, and available advanced directive documents from current and previous encounters.   After reviewing the patient's chart, pt was assessed at bedside.      Latest Ref Rng & Units 10/27/2024    5:00 AM 10/25/2024   11:47 AM 10/23/2024    3:38 AM  CBC  WBC 4.0 - 10.5 K/uL 3.8  3.1  10.3   Hemoglobin 12.0 - 15.0 g/dL 9.5  89.3  88.7   Hematocrit 36.0 - 46.0 % 28.8  31.2  33.6   Platelets 150 - 400 K/uL 101  91  137       Latest Ref Rng & Units 10/27/2024    5:00 AM 10/26/2024    3:45 AM 10/25/2024    5:18 AM  CMP  Glucose 70 - 99  mg/dL 899  893  882   BUN 8 - 23 mg/dL 14  12  14    Creatinine 0.44 - 1.00 mg/dL 9.59  9.55  9.47   Sodium 135 - 145 mmol/L 147  149  149   Potassium 3.5 - 5.1 mmol/L 3.4  3.8  4.2   Chloride 98 - 111 mmol/L 115  117  120   CO2 22 - 32 mmol/L 24  23  22    Calcium 8.9 - 10.3 mg/dL 9.6  9.5  9.5     Chronically, ill-appearing female sitting upright in bed. She is alert and able to answer questions today but remains confused. She states her DOB and daughter's name correctly. She is unable to correctly state year or current location. She is pleasant during visit but shows signs of agitation with constant pulling at gown, cords and bedding. Respirations are even and unlabored. She is in no distress. No family or visitors present during visit.    Per conversation and note, Saddie Na, [facility] liaison, had meeting with family as they called and requested more information about hospice care. After education regarding hospice, family decided that they are not ready for comfort/hospice care path and want to continue with current treatment.   Allena (TOC), Saddie HERO [facility] liaison) and Dr. Trudy are collaborating to ensure patient has all  DME needed to discharge home when she is medically stable.   Attempted to reach daughter unsuccessfully via phone. Left HIPAA appropriate VM. No return call received at this time.    Physical Exam Vitals reviewed.  Constitutional:      General: She is not in acute distress.    Appearance: She is ill-appearing.     Comments: Thin, frail  HENT:     Head: Normocephalic and atraumatic.     Mouth/Throat:     Mouth: Mucous membranes are moist.  Pulmonary:     Effort: Pulmonary effort is normal. No respiratory distress.  Musculoskeletal:     Right lower leg: No edema.     Left lower leg: No edema.  Skin:    General: Skin is warm and dry.     Comments: Port to R chest  Neurological:     Mental Status: She is alert. She is disoriented.     Motor: Weakness present.    Recommendations/Plan: FULL CODE status as previously documented    Continue current supportive interventions Home with HH Palliative will follow peripherally as goals are clear     Total Time 65 minutes   Plan of care discussed via Epic chat with Saddie HERO [facility] liaison), Allena (TOC) and Dr. Trudy  Time spent includes: Detailed review of medical records (labs, imaging, vital signs), medically appropriate exam (mental status, respiratory, cardiac, skin), discussed with treatment team, counseling and educating patient, family and staff, documenting clinical information, medication management and coordination of care.  Addendum 1506: Monica (daughter) returned call and shares she had meeting with Saddie, [facility] liaison, earlier today. She confirms plan to continue with current treatment then discharge with home health. She had no questions at the time of conversation. Advised that we would check her mother's follow her peripherally and remain available for needs.     Devere Sacks, ELNITA- [facility] Palliative Medicine Team  10/27/2024 8:19 AM  Office [phone]  Pager [phone]

## 2024-10-27 NOTE — Progress Notes (Addendum)
 PROGRESS NOTE    Doris Johnson  FMW:[id number] DOB: [birth year] DOA: 10/22/2024 PCP: Epifanio Alm [name], MD   Assessment & Plan:   Principal Problem:   Altered mental status Active Problems:   Protein-calorie malnutrition, severe   Hypophosphatemia   Pressure injury of skin   Hypercholesteremia   Metastasis to brain (HCC)   Sepsis secondary to UTI (HCC)   Failure to thrive in adult  Assessment and Plan: Acute encephalopathy: likely secondary to UTI, hypernatremia and brain mets from lung cancer. Re-orient prn   Hypernatremia: started on D5W. Repeat sodium level ordered   Hypophosphatemia: will give potassium phosphate    Severe protein calorie malnutrition: encourage po intake    Pressure injury of skin: continue w/ wound care   Pancytopenia: possibly secondary to recent chemo. No need for a transfusion currently. Will continue to monitor    Failure to thrive in adult: hospice consulted. Continue w/ supportive care   Sepsis: see Dr. Stacy note on how pt met sepsis criteria. Secondary to UTI. Continue on IV rocephin     Lung cancer: w/ mets to the brain. Continue on home dose of steroids. Hospice consulted     DVT prophylaxis: eliquis  Code Status: full  Family Communication: discussed pt's care w/ pt's family at bedside and answered their questions  Disposition Plan: likely d/c back home w/ HH  Level of care: Telemetry  Status is: Inpatient Remains inpatient appropriate because: severity of illness    Consultants:  Palliative care  Procedures:   Antimicrobials:   Subjective: Pt is pleasantly confused  Objective: Vitals:   10/26/24 1615 10/26/24 2044 10/27/24 0402 10/27/24 0748  BP: 125/64 107/70 (!) 134/92 (!) 121/110  Pulse: (!) 52 (!) 58 (!) 110 (!) 55  Resp: 16 16 14 16   Temp: (!) 97.4 F (36.3 C) (!) 97.5 F (36.4 C) 98 F (36.7 C) 97.7 F (36.5 C)  TempSrc:      SpO2:  91% 100% 93%  Weight:   62.3 kg   Height:        Intake/Output  Summary (Last 24 hours) at 10/27/2024 0944 Last data filed at 10/27/2024 0522 Gross per 24 hour  Intake 90 ml  Output 300 ml  Net -210 ml   Filed Weights   10/24/24 0445 10/26/24 0508 10/27/24 0402  Weight: 58.6 kg 61.8 kg 62.3 kg    Examination:  General exam: Appears calm and comfortable. Frail appearing  Respiratory system: decreased breath sounds b/l  Cardiovascular system: S1 & S2+. No  rubs, gallops or clicks.  Gastrointestinal system: Abdomen is nondistended, soft and nontender. Normal bowel sounds heard. Central nervous system: Alert and awake Psychiatry: Judgement and insight appears poor. Flat mood and affect     Data Reviewed: I have personally reviewed following labs and imaging studies  CBC: Recent Labs  Lab 10/22/24 1244 10/23/24 0338 10/25/24 1147 10/27/24 0500  WBC 11.2* 10.3 3.1* 3.8*  NEUTROABS 10.7*  --   --   --   HGB 13.0 11.2* 10.6* 9.5*  HCT 38.6 33.6* 31.2* 28.8*  MCV 90.2 90.8 89.1 89.4  PLT 165 137* 91* 101*   Basic Metabolic Panel: Recent Labs  Lab 10/23/24 0338 10/24/24 0458 10/25/24 0518 10/26/24 0345 10/27/24 0500  NA 148* 147* 149* 149* 147*  K 3.1* 3.3* 4.2 3.8 3.4*  CL 114* 115* 120* 117* 115*  CO2 18* 17* 22 23 24   GLUCOSE 153* 91 117* 106* 100*  BUN 26* 17 14 12 14   CREATININE 0.62 0.45  0.52 0.44 0.40*  CALCIUM 9.8 9.5 9.5 9.5 9.6  MG  --  2.3 2.0 1.9 1.9  PHOS  --  1.4* 1.3* 1.5* 2.4*   GFR: Estimated Creatinine Clearance: 51 mL/min (A) (by C-G formula based on SCr of 0.4 mg/dL (L)). Liver Function Tests: Recent Labs  Lab 10/22/24 1244  AST 18  ALT 7  ALKPHOS 92  BILITOT 0.9  PROT 7.7  ALBUMIN 3.6   No results for input(s): LIPASE, AMYLASE in the last 168 hours. No results for input(s): AMMONIA in the last 168 hours. Coagulation Profile: Recent Labs  Lab 10/22/24 1244  INR 1.5*   Cardiac Enzymes: No results for input(s): CKTOTAL, CKMB, CKMBINDEX, TROPONINI in the last 168 hours. BNP  (last 3 results) No results for input(s): PROBNP in the last 8760 hours. HbA1C: No results for input(s): HGBA1C in the last 72 hours. CBG: Recent Labs  Lab 10/26/24 0842 10/26/24 1141 10/26/24 1644 10/26/24 2143 10/27/24 0753  GLUCAP 99 96 127* 135* 87   Lipid Profile: No results for input(s): CHOL, HDL, LDLCALC, TRIG, CHOLHDL, LDLDIRECT in the last 72 hours. Thyroid  Function Tests: No results for input(s): TSH, T4TOTAL, FREET4, T3FREE, THYROIDAB in the last 72 hours. Anemia Panel: No results for input(s): VITAMINB12, FOLATE, FERRITIN, TIBC, IRON, RETICCTPCT in the last 72 hours. Sepsis Labs: Recent Labs  Lab 10/22/24 1244 10/22/24 1513  LATICACIDVEN 3.1* 2.4*    Recent Results (from the past 240 hours)  Culture, blood (Routine x 2)     Status: Abnormal   Collection Time: 10/22/24 12:44 PM   Specimen: BLOOD  Result Value Ref Range Status   Specimen Description   Final    BLOOD BLOOD LEFT HAND Performed at [facility], [address] [place], [place] [address]    Special Requests   Final    BOTTLES DRAWN AEROBIC AND ANAEROBIC Blood Culture results may not be optimal due to an inadequate volume of blood received in culture bottles Performed at [facility], [address], [place], [place] [address]    Culture  Setup Time   Final    GRAM NEGATIVE RODS IN BOTH AEROBIC AND ANAEROBIC BOTTLES CRITICAL VALUE NOTED.  VALUE IS CONSISTENT WITH PREVIOUSLY REPORTED AND CALLED VALUE. Performed at [facility], [address] [place], [place] [address]    Culture (A)  Final    ESCHERICHIA COLI SUSCEPTIBILITIES PERFORMED ON PREVIOUS CULTURE WITHIN THE LAST 5 DAYS. Performed at [facility], [address], [place], [place] [address]    Report Status 10/25/2024 FINAL  Final  Culture, blood (Routine x 2)     Status: Abnormal   Collection Time: 10/22/24 12:44 PM   Specimen: Left Antecubital; Blood   Result Value Ref Range Status   Specimen Description   Final    LEFT ANTECUBITAL Performed at [facility], [address] [place], [place] [address]    Special Requests   Final    BOTTLES DRAWN AEROBIC AND ANAEROBIC Blood Culture results may not be optimal due to an inadequate volume of blood received in culture bottles Performed at [facility], [address], [place], [place] [address]    Culture  Setup Time   Final    GRAM NEGATIVE RODS IN BOTH AEROBIC AND ANAEROBIC BOTTLES CRITICAL RESULT CALLED TO, READ BACK BY AND VERIFIED WITH: NATHAN BELUE PHARMD @0103  10/23/24 ASW Performed at [facility], [address], [place], [place] [address]    Culture ESCHERICHIA  COLI (A)  Final   Report Status 10/25/2024 FINAL  Final   Organism ID, Bacteria ESCHERICHIA COLI  Final      Susceptibility   Escherichia coli - MIC*    AMPICILLIN <=2 SENSITIVE Sensitive     CEFAZOLIN  (NON-URINE) <=1 SENSITIVE Sensitive     CEFEPIME  <=0.12 SENSITIVE Sensitive     ERTAPENEM <=0.12 SENSITIVE Sensitive     CEFTRIAXONE  <=0.25 SENSITIVE Sensitive     CIPROFLOXACIN  <=0.06 SENSITIVE Sensitive     GENTAMICIN  <=1 SENSITIVE Sensitive     MEROPENEM <=0.25 SENSITIVE Sensitive     TRIMETH/SULFA <=20 SENSITIVE Sensitive     AMPICILLIN/SULBACTAM <=2 SENSITIVE Sensitive     PIP/TAZO Value in next row Sensitive      <=4 SENSITIVEThis is a modified FDA-approved test that has been validated and its performance characteristics determined by the reporting laboratory.  This laboratory is certified under the Clinical Laboratory Improvement Amendments CLIA as qualified to perform high complexity clinical laboratory testing.    * ESCHERICHIA COLI  Blood Culture ID Panel (Reflexed)     Status: Abnormal   Collection Time: 10/22/24 12:44 PM  Result Value Ref Range Status   Enterococcus faecalis NOT DETECTED NOT DETECTED Final   Enterococcus Faecium NOT DETECTED NOT DETECTED Final   Listeria  monocytogenes NOT DETECTED NOT DETECTED Final   Staphylococcus species NOT DETECTED NOT DETECTED Final   Staphylococcus aureus (BCID) NOT DETECTED NOT DETECTED Final   Staphylococcus epidermidis NOT DETECTED NOT DETECTED Final   Staphylococcus lugdunensis NOT DETECTED NOT DETECTED Final   Streptococcus species NOT DETECTED NOT DETECTED Final   Streptococcus agalactiae NOT DETECTED NOT DETECTED Final   Streptococcus pneumoniae NOT DETECTED NOT DETECTED Final   Streptococcus pyogenes NOT DETECTED NOT DETECTED Final   A.calcoaceticus-baumannii NOT DETECTED NOT DETECTED Final   Bacteroides fragilis NOT DETECTED NOT DETECTED Final   Enterobacterales DETECTED (A) NOT DETECTED Final    Comment: Enterobacterales represent a large order of gram negative bacteria, not a single organism. CRITICAL RESULT CALLED TO, READ BACK BY AND VERIFIED WITH: NATHAN BELUE PHARMD @0103  10/23/24 ASW    Enterobacter cloacae complex NOT DETECTED NOT DETECTED Final   Escherichia coli DETECTED (A) NOT DETECTED Final    Comment: CRITICAL RESULT CALLED TO, READ BACK BY AND VERIFIED WITH: NATHAN BELUE PHARMD @0103  10/23/24 ASW    Klebsiella aerogenes NOT DETECTED NOT DETECTED Final   Klebsiella oxytoca NOT DETECTED NOT DETECTED Final   Klebsiella pneumoniae NOT DETECTED NOT DETECTED Final   Proteus species NOT DETECTED NOT DETECTED Final   Salmonella species NOT DETECTED NOT DETECTED Final   Serratia marcescens NOT DETECTED NOT DETECTED Final   Haemophilus influenzae NOT DETECTED NOT DETECTED Final   Neisseria meningitidis NOT DETECTED NOT DETECTED Final   Pseudomonas aeruginosa NOT DETECTED NOT DETECTED Final   Stenotrophomonas maltophilia NOT DETECTED NOT DETECTED Final   Candida albicans NOT DETECTED NOT DETECTED Final   Candida auris NOT DETECTED NOT DETECTED Final   Candida glabrata NOT DETECTED NOT DETECTED Final   Candida krusei NOT DETECTED NOT DETECTED Final   Candida parapsilosis NOT DETECTED NOT DETECTED  Final   Candida tropicalis NOT DETECTED NOT DETECTED Final   Cryptococcus neoformans/gattii NOT DETECTED NOT DETECTED Final   CTX-M ESBL NOT DETECTED NOT DETECTED Final   Carbapenem resistance IMP NOT DETECTED NOT DETECTED Final   Carbapenem resistance KPC NOT DETECTED NOT DETECTED Final   Carbapenem resistance NDM NOT DETECTED NOT DETECTED Final   Carbapenem resist OXA 48 LIKE  NOT DETECTED NOT DETECTED Final   Carbapenem resistance VIM NOT DETECTED NOT DETECTED Final    Comment: Performed at [facility], [address] [place], [place] [address]  Urine Culture     Status: Abnormal   Collection Time: 10/22/24  1:17 PM   Specimen: Urine, Random  Result Value Ref Range Status   Specimen Description   Final    URINE, RANDOM Performed at [facility], [address] [place], [place] [address]    Special Requests   Final    NONE Reflexed from [id number] Performed at [facility] [address] [place], [place] [address]    Culture >=100,000 COLONIES/mL ESCHERICHIA COLI (A)  Final   Report Status 10/24/2024 FINAL  Final   Organism ID, Bacteria ESCHERICHIA COLI (A)  Final      Susceptibility   Escherichia coli - MIC*    AMPICILLIN <=2 SENSITIVE Sensitive     CEFAZOLIN  (URINE) Value in next row Sensitive      <=1 SENSITIVEThis is a modified FDA-approved test that has been validated and its performance characteristics determined by the reporting laboratory.  This laboratory is certified under the Clinical Laboratory Improvement Amendments CLIA as qualified to perform high complexity clinical laboratory testing.    CEFEPIME  Value in next row Sensitive      <=1 SENSITIVEThis is a modified FDA-approved test that has been validated and its performance characteristics determined by the reporting laboratory.  This laboratory is certified under the Clinical Laboratory Improvement Amendments CLIA as qualified to perform high complexity clinical laboratory testing.     ERTAPENEM Value in next row Sensitive      <=1 SENSITIVEThis is a modified FDA-approved test that has been validated and its performance characteristics determined by the reporting laboratory.  This laboratory is certified under the Clinical Laboratory Improvement Amendments CLIA as qualified to perform high complexity clinical laboratory testing.    CEFTRIAXONE  Value in next row Sensitive      <=1 SENSITIVEThis is a modified FDA-approved test that has been validated and its performance characteristics determined by the reporting laboratory.  This laboratory is certified under the Clinical Laboratory Improvement Amendments CLIA as qualified to perform high complexity clinical laboratory testing.    CIPROFLOXACIN  Value in next row Sensitive      <=1 SENSITIVEThis is a modified FDA-approved test that has been validated and its performance characteristics determined by the reporting laboratory.  This laboratory is certified under the Clinical Laboratory Improvement Amendments CLIA as qualified to perform high complexity clinical laboratory testing.    GENTAMICIN  Value in next row Sensitive      <=1 SENSITIVEThis is a modified FDA-approved test that has been validated and its performance characteristics determined by the reporting laboratory.  This laboratory is certified under the Clinical Laboratory Improvement Amendments CLIA as qualified to perform high complexity clinical laboratory testing.    NITROFURANTOIN Value in next row Sensitive      <=1 SENSITIVEThis is a modified FDA-approved test that has been validated and its performance characteristics determined by the reporting laboratory.  This laboratory is certified under the Clinical Laboratory Improvement Amendments CLIA as qualified to perform high complexity clinical laboratory testing.    TRIMETH/SULFA Value in next row Sensitive      <=1 SENSITIVEThis is a modified FDA-approved test that has been validated and its performance characteristics  determined by the reporting laboratory.  This laboratory is certified under the Clinical Laboratory Improvement Amendments CLIA as qualified to perform high complexity clinical  laboratory testing.    AMPICILLIN/SULBACTAM Value in next row Sensitive      <=1 SENSITIVEThis is a modified FDA-approved test that has been validated and its performance characteristics determined by the reporting laboratory.  This laboratory is certified under the Clinical Laboratory Improvement Amendments CLIA as qualified to perform high complexity clinical laboratory testing.    PIP/TAZO Value in next row Sensitive      <=4 SENSITIVEThis is a modified FDA-approved test that has been validated and its performance characteristics determined by the reporting laboratory.  This laboratory is certified under the Clinical Laboratory Improvement Amendments CLIA as qualified to perform high complexity clinical laboratory testing.    MEROPENEM Value in next row Sensitive      <=4 SENSITIVEThis is a modified FDA-approved test that has been validated and its performance characteristics determined by the reporting laboratory.  This laboratory is certified under the Clinical Laboratory Improvement Amendments CLIA as qualified to perform high complexity clinical laboratory testing.    * >=100,000 COLONIES/mL ESCHERICHIA COLI         Radiology Studies: MR BRAIN W WO CONTRAST Result Date: 10/26/2024 EXAM: MRI BRAIN WITH AND WITHOUT CONTRAST 10/26/2024 07:20:10 PM TECHNIQUE: Multiplanar multisequence MRI of the head/brain was performed with and without the administration of intravenous contrast. CONTRAST: 6 mL of Gadobutrol . COMPARISON: MR Head Wo/w Cm 06/28/2024. CLINICAL HISTORY: Altered mental status change, presumed secondary to urinary tract infection. Acute neuro deficit, stroke suspected. Striae of brain metastasis. Sepsis criteria with tachycardia, leukocytosis, elevated lactic. FINDINGS: BRAIN AND VENTRICLES: Diffuse confluent  hyperintense T2-weighted signal within the bilateral cerebral white matter. Multifocal chronic hemorrhage associated with previously treated metastases. There are 5 intraparenchymal metastases visible on the current study. The number is greatly reduced from the prior examination, although there are technical differences between the 2 scans that may hide some of the smaller lesions on the current study. The lesions are best seen on the postcontrast sagittal sequence. The right temporal lesion has decreased in size (image 5). The posterior right parietal lesion has decreased in size (image 6). The superior right cerebellar lesion has slightly decreased in size (image 10). The posterior right parietal lesion has decreased in size (paren image 12). The superior left cerebellar lesion has slightly decreased in size (image 19). No acute infarct. No acute intracranial hemorrhage. No mass effect or midline shift. No hydrocephalus. The sella is unremarkable. Normal flow voids. ORBITS: No acute abnormality. SINUSES: Right mastoid effusion. BONES AND SOFT TISSUES: Normal bone marrow signal and enhancement. No acute soft tissue abnormality. IMPRESSION: 1. No acute intracranial abnormality 2. Five intraparenchymal metastases, decreased in number and size from the prior exam, acknowledging technical differences that may obscure smaller lesions. 3. Diffuse confluent T2 hyperintensity in the bilateral cerebral white matter, likely a sequela of radiation treatment. Electronically signed by: Franky Stanford MD 10/26/2024 11:55 PM EST RP Workstation: [id number]   DG Abd Portable 1V Result Date: 10/25/2024 EXAM: 1 VIEW XRAY OF THE ABDOMEN 10/25/2024 09:43:03 PM COMPARISON: None available. CLINICAL HISTORY: Mental status change, unknown cause; Neuro deficit, acute, stroke suspected; [phone] Encounter for imaging to screen for metal prior to magnetic resonance imaging (MRI) [id number]; Striae of brain metastasis FINDINGS: BOWEL:  Nonobstructive bowel gas pattern. SOFT TISSUES: No radiopaque foreign body is seen. No abnormal calcifications. BONES: No acute fracture. IMPRESSION: 1. No radiopaque foreign body. Electronically signed by: Pinkie Pebbles MD 10/25/2024 09:46 PM EST RP Workstation: [id number]        Scheduled Meds:  amLODipine   5 mg  Oral Daily   apixaban   5 mg Oral BID   Chlorhexidine  Gluconate Cloth  6 each Topical Daily   dexamethasone   4 mg Oral Daily   feeding supplement  237 mL Oral TID BM   insulin  aspart  0-5 Units Subcutaneous QHS   insulin  aspart  0-9 Units Subcutaneous TID WC   mirtazapine   15 mg Oral QHS   multivitamin with minerals  1 tablet Oral Daily   OLANZapine   5 mg Oral QHS   sodium chloride  flush  10-40 mL Intracatheter Q12H   thiamine   100 mg Oral Daily   traZODone   50 mg Oral QHS   Continuous Infusions:  cefTRIAXone  (ROCEPHIN )  IV 2 g (10/26/24 2231)     LOS: 4 days       Anthony CHRISTELLA Pouch, MD [facility] Pager [phone]  If 7PM-7AM, please contact night-coverage [email or web address] 10/27/2024, 9:44 AM

## 2024-10-27 NOTE — Plan of Care (Signed)

## 2024-10-28 LAB — CBC
HCT: 29.4 % — ABNORMAL LOW (ref 36.0–46.0)
Hemoglobin: 10.1 g/dL — ABNORMAL LOW (ref 12.0–15.0)
MCH: 30.1 pg (ref 26.0–34.0)
MCHC: 34.4 g/dL (ref 30.0–36.0)
MCV: 87.8 fL (ref 80.0–100.0)
Platelets: 103 K/uL — ABNORMAL LOW (ref 150–400)
RBC: 3.35 MIL/uL — ABNORMAL LOW (ref 3.87–5.11)
RDW: 16.4 % — ABNORMAL HIGH (ref 11.5–15.5)
WBC: 3.7 K/uL — ABNORMAL LOW (ref 4.0–10.5)
nRBC: 0 % (ref 0.0–0.2)

## 2024-10-28 LAB — BASIC METABOLIC PANEL WITH GFR
Anion gap: 13 (ref 5–15)
BUN: 11 mg/dL (ref 8–23)
CO2: 23 mmol/L (ref 22–32)
Calcium: 9.3 mg/dL (ref 8.9–10.3)
Chloride: 107 mmol/L (ref 98–111)
Creatinine, Ser: 0.39 mg/dL — ABNORMAL LOW (ref 0.44–1.00)
GFR, Estimated: >60 mL/min (ref >60)
Glucose, Bld: 119 mg/dL — ABNORMAL HIGH (ref 70–99)
Potassium: 3.2 mmol/L — ABNORMAL LOW (ref 3.5–5.1)
Sodium: 143 mmol/L (ref 135–145)

## 2024-10-28 LAB — GLUCOSE, CAPILLARY
Glucose-Capillary: 111 mg/dL — ABNORMAL HIGH (ref 70–99)
Glucose-Capillary: 84 mg/dL (ref 70–99)
Glucose-Capillary: 175 mg/dL — ABNORMAL HIGH (ref 70–99)

## 2024-10-28 LAB — MAGNESIUM: Magnesium: 1.9 mg/dL (ref 1.7–2.4)

## 2024-10-28 LAB — PHOSPHORUS: Phosphorus: 2.4 mg/dL — ABNORMAL LOW (ref 2.5–4.6)

## 2024-10-28 MED ORDER — MAGNESIUM SULFATE 2 GM/50ML IV SOLN
2.0000 g | Freq: Once | INTRAVENOUS | Status: AC
  Administered 2024-10-28: 13:00:00 2 g via INTRAVENOUS
  Filled 2024-10-28: qty 50

## 2024-10-28 MED ORDER — POTASSIUM PHOSPHATES 15 MMOLE/5ML IV SOLN
30.0000 mmol | Freq: Once | INTRAVENOUS | Status: AC
  Administered 2024-10-28: 17:00:00 30 mmol via INTRAVENOUS
  Filled 2024-10-28: qty 10

## 2024-10-28 MED ORDER — POTASSIUM CHLORIDE CRYS ER 20 MEQ PO TBCR
40.0000 meq | EXTENDED_RELEASE_TABLET | Freq: Once | ORAL | Status: AC
  Administered 2024-10-28: 13:00:00 40 meq via ORAL
  Filled 2024-10-28: qty 2

## 2024-10-28 MED ORDER — SODIUM CHLORIDE 0.9 % IV BOLUS
1000.0000 mL | Freq: Once | INTRAVENOUS | Status: AC
  Administered 2024-10-28: 09:00:00 1000 mL via INTRAVENOUS

## 2024-10-28 MED ORDER — LORAZEPAM 2 MG/ML IJ SOLN: 1.0000 mg | INTRAMUSCULAR | Status: DC | PRN

## 2024-10-28 MED ORDER — DEXAMETHASONE SOD PHOSPHATE PF 10 MG/ML IJ SOLN
10.0000 mg | Freq: Three times a day (TID) | INTRAMUSCULAR | Status: AC
  Administered 2024-10-28 – 2024-10-30 (×6): 10 mg via INTRAVENOUS

## 2024-10-28 MED ORDER — LORAZEPAM 2 MG/ML IJ SOLN
1.0000 mg | INTRAMUSCULAR | Status: DC | PRN
  Administered 2024-10-28: 05:00:00 1 mg via INTRAVENOUS
  Filled 2024-10-28: qty 1

## 2024-10-28 NOTE — Progress Notes (Signed)
 Notified Dr. Lawence via secure chat at 0131 that patient's current rectal temp is 92.7, last normal temp was at 3pm yesterday 97.6. Bear hugger placed on patient. Still unable to obtain oral/axillary temp, only rectal. Pt is oriented to self at baseline & seems content. All other VS stable. Received orders to give warm fluids & increase IVF to D5 normal 100 mL/hr.

## 2024-10-28 NOTE — Progress Notes (Signed)
 PT Cancellation Note  Patient Details Name: SHEENAH DIMITROFF MRN: [id number] DOB: 10-14-1951   Cancelled Treatment:    Reason Eval/Treat Not Completed: Other (comment)  Pt completing OT session.  Will return at later time/date.   Lauraine Gills 10/28/2024, 1:42 PM

## 2024-10-28 NOTE — Progress Notes (Signed)
 Palliative Care Progress Note, Assessment & Plan   Patient Name: Doris Johnson       Date: 10/28/2024 DOB: 1951/04/10  Age: 73 y.o. MRN#: [id number] Attending Physician: Trudy Anthony HERO, MD Primary Care Physician: Epifanio Alm [name], MD Admit Date: 10/22/2024  Subjective: Unable to assess  HPI: 73 y.o. female  with past medical history of lung cancer with diffuse metastasis, non-insulin -dependent diabetes mellitus, hypertension, hypothyroidism admitted on 10/22/2024 with AMS and lethargy.   As per chart review, pt presented to ED for evaluation of decreased urine output, lethargy, and altered mentation over the past several days PTA.    Workup revealed blood cultures positive for E. Coli bacteremia. Pt is being treated for E.coli bacteremia, decreased urine output and oral intake, acute metabolic encephalopathy, hypernatremia, and (suspected) severe protein calorie malnutrition.    Palliative team consulted to assist with goals of care conversations.  Summary of counseling/coordination of care: Extensive chart review completed prior to meeting patient including labs, vital signs, imaging, progress notes, orders, and available advanced directive documents from current and previous encounters.   After reviewing the patient's chart and assessing the patient at bedside, I spoke with patient's family in regards to symptom management and goals of care.      Latest Ref Rng & Units 10/28/2024    4:31 AM 10/27/2024    5:00 AM 10/25/2024   11:47 AM  CBC  WBC 4.0 - 10.5 K/uL 3.7  3.8  3.1   Hemoglobin 12.0 - 15.0 g/dL 89.8  9.5  89.3   Hematocrit 36.0 - 46.0 % 29.4  28.8  31.2   Platelets 150 - 400 K/uL 103  101  91       Latest Ref Rng & Units 10/28/2024    4:31 AM 10/27/2024    5:24 PM  10/27/2024    5:00 AM  CMP  Glucose 70 - 99 mg/dL 880   899   BUN 8 - 23 mg/dL 11   14   Creatinine 9.55 - 1.00 mg/dL 9.60   9.59   Sodium 864 - 145 mmol/L 143  127  147   Potassium 3.5 - 5.1 mmol/L 3.2   3.4   Chloride 98 - 111 mmol/L 107   115   CO2 22 - 32 mmol/L 23   24   Calcium 8.9 - 10.3 mg/dL 9.3   9.6     Chronically ill-appearing, elderly female sitting upright in bed with family and safety sitter at bedside. She is very agitated but sleepy. She does not acknowledge my presence or respond to questions. Respirations are even and unlabored. She is in no distress.   Discussed with Odella, daughter, that her mother had significant change in status overnight with rectal temp of 93.1,requiring the warmer, was given fluids and abx and  required ativan  for agitation. Odella confirms that the plan of care remains the same and wants her mother to go home with home health once she is medically stable.   Odella shares that she has PMT contact information and will call should she have questions or concerns.   Therapeutic silence and active listening provided for family to share their thoughts and emotions regarding current medical  situation.  Emotional support provided.  PMT will step back from daily visits but remain available if needed.   Physical Exam Vitals reviewed.  Constitutional:      General: She is not in acute distress.    Appearance: She is ill-appearing.     Comments: Thin, frail  HENT:     Head: Normocephalic and atraumatic.  Pulmonary:     Effort: Pulmonary effort is normal. No respiratory distress.  Musculoskeletal:     Right lower leg: No edema.     Left lower leg: No edema.  Skin:    General: Skin is warm and dry.     Comments: Port to R chest  Neurological:     Mental Status: She is lethargic, disoriented and confused.  Psychiatric:        Behavior: Behavior is agitated.   Recommendations/Plan: FULL CODE status as previously documented    Continue current  supportive interventions Home with HH when medically stable Palliative will step back from daily visits but remain available            Total Time 50 minutes   Time spent includes: Detailed review of medical records (labs, imaging, vital signs), medically appropriate exam (mental status, respiratory, cardiac, skin), discussed with treatment team, counseling and educating patient, family and staff, documenting clinical information, medication management and coordination of care.     Devere Sacks, ELNITA- [facility] Palliative Medicine Team  10/28/2024 11:22 AM  Office [phone]  Pager [phone]

## 2024-10-28 NOTE — Progress Notes (Signed)
 PROGRESS NOTE    Doris Johnson  FMW:[id number] DOB: 08-31-1951 DOA: 10/22/2024 PCP: Epifanio Alm [name], MD   Assessment & Plan:   Principal Problem:   Altered mental status Active Problems:   Protein-calorie malnutrition, severe   Hypophosphatemia   Pressure injury of skin   Hypercholesteremia   Metastasis to brain (HCC)   Sepsis secondary to UTI (HCC)   Failure to thrive in adult  Assessment and Plan: Acute encephalopathy: likely secondary to UTI, hypernatremia and brain mets from lung cancer. Re-orient prn. Talked w/ pt's oncologist at [facility] who recommended started IV decadron  10mg  q8hr x 2 days in hopes to improve poor mental status possibly secondary to neurotoxicity from immunotherapy pt has been receiving for lung cancer   Hypernatremia: resolved   Hypophosphatemia: will give potassium phosphate  again today.    Severe protein calorie malnutrition: encourage po intake    Pressure injury of skin: continue w/ wound care   Pancytopenia: possibly secondary to recent chemo. No need for a transfusion currently    Failure to thrive in adult: fam does not want hospice at this time. Continue w. Suppurtive care   Sepsis: see Dr. Stacy note on how pt met sepsis criteria. Secondary to UTI. Continue on IV rocephin     Lung cancer: w/ mets to the brain. Talked w/ pt's oncologist at [facility] who requested to treat pt on IV decadron  10mg  q8h x 2 days in hopes to improve poor mental status that might be secondary to neurotoxicity from immunotherapy pt was receiving for lung cancer      DVT prophylaxis: eliquis  Code Status: full  Family Communication: discussed pt's care w/ pt's family at bedside and answered their questions  Disposition Plan: likely d/c back home w/ HH  Level of care: Telemetry  Status is: Inpatient Remains inpatient appropriate because: severity of illness    Consultants:  Palliative care  Procedures:   Antimicrobials:   Subjective: Pt is lethargic    Objective: Vitals:   10/28/24 0442 10/28/24 0800 10/28/24 0807 10/28/24 0948  BP:  (!) 66/43 (!) 71/54 98/62  Pulse:  (!) 58 (!) 57 76  Resp:  16  16  Temp:  (!) 97.3 F (36.3 C)  97.6 F (36.4 C)  TempSrc:      SpO2:  100%  100%  Weight: 64.3 kg     Height:        Intake/Output Summary (Last 24 hours) at 10/28/2024 0959 Last data filed at 10/28/2024 0349 Gross per 24 hour  Intake 580 ml  Output 300 ml  Net 280 ml   Filed Weights   10/26/24 0508 10/27/24 0402 10/28/24 0442  Weight: 61.8 kg 62.3 kg 64.3 kg    Examination:  General exam: appears lethargic. Frail appearing  Respiratory system: diminished breath sounds b/l  Cardiovascular system: S1/S2+. No rubs or clicks.  Gastrointestinal system: Abd is soft, NT, ND & hypoactive bowel sounds  Central nervous system: lethargic. Unable to follow simple commands Psychiatry: Judgement and insight appears poor.     Data Reviewed: I have personally reviewed following labs and imaging studies  CBC: Recent Labs  Lab 10/22/24 1244 10/23/24 0338 10/25/24 1147 10/27/24 0500 10/28/24 0431  WBC 11.2* 10.3 3.1* 3.8* 3.7*  NEUTROABS 10.7*  --   --   --   --   HGB 13.0 11.2* 10.6* 9.5* 10.1*  HCT 38.6 33.6* 31.2* 28.8* 29.4*  MCV 90.2 90.8 89.1 89.4 87.8  PLT 165 137* 91* 101* 103*   Basic  Metabolic Panel: Recent Labs  Lab 10/24/24 0458 10/25/24 0518 10/26/24 0345 10/27/24 0500 10/27/24 1724 10/28/24 0431  NA 147* 149* 149* 147* 127* 143  K 3.3* 4.2 3.8 3.4*  --  3.2*  CL 115* 120* 117* 115*  --  107  CO2 17* 22 23 24   --  23  GLUCOSE 91 117* 106* 100*  --  119*  BUN 17 14 12 14   --  11  CREATININE 0.45 0.52 0.44 0.40*  --  0.39*  CALCIUM 9.5 9.5 9.5 9.6  --  9.3  MG 2.3 2.0 1.9 1.9  --  1.9  PHOS 1.4* 1.3* 1.5* 2.4*  --  2.4*   GFR: Estimated Creatinine Clearance: 55.7 mL/min (A) (by C-G formula based on SCr of 0.39 mg/dL (L)). Liver Function Tests: Recent Labs  Lab 10/22/24 1244  AST 18  ALT 7   ALKPHOS 92  BILITOT 0.9  PROT 7.7  ALBUMIN 3.6   No results for input(s): LIPASE, AMYLASE in the last 168 hours. No results for input(s): AMMONIA in the last 168 hours. Coagulation Profile: Recent Labs  Lab 10/22/24 1244  INR 1.5*   Cardiac Enzymes: No results for input(s): CKTOTAL, CKMB, CKMBINDEX, TROPONINI in the last 168 hours. BNP (last 3 results) No results for input(s): PROBNP in the last 8760 hours. HbA1C: No results for input(s): HGBA1C in the last 72 hours. CBG: Recent Labs  Lab 10/27/24 0753 10/27/24 1211 10/27/24 1945 10/27/24 2227 10/28/24 0754  GLUCAP 87 108* 177* 147* 111*   Lipid Profile: No results for input(s): CHOL, HDL, LDLCALC, TRIG, CHOLHDL, LDLDIRECT in the last 72 hours. Thyroid  Function Tests: No results for input(s): TSH, T4TOTAL, FREET4, T3FREE, THYROIDAB in the last 72 hours. Anemia Panel: No results for input(s): VITAMINB12, FOLATE, FERRITIN, TIBC, IRON, RETICCTPCT in the last 72 hours. Sepsis Labs: Recent Labs  Lab 10/22/24 1244 10/22/24 1513  LATICACIDVEN 3.1* 2.4*    Recent Results (from the past 240 hours)  Culture, blood (Routine x 2)     Status: Abnormal   Collection Time: 10/22/24 12:44 PM   Specimen: BLOOD  Result Value Ref Range Status   Specimen Description   Final    BLOOD BLOOD LEFT HAND Performed at [facility], [address], [place], [place] [address]    Special Requests   Final    BOTTLES DRAWN AEROBIC AND ANAEROBIC Blood Culture results may not be optimal due to an inadequate volume of blood received in culture bottles Performed at [facility], [address] [place], [place] [address]    Culture  Setup Time   Final    GRAM NEGATIVE RODS IN BOTH AEROBIC AND ANAEROBIC BOTTLES CRITICAL VALUE NOTED.  VALUE IS CONSISTENT WITH PREVIOUSLY REPORTED AND CALLED VALUE. Performed at [facility], [address] [place], [place]  [address]    Culture (A)  Final    ESCHERICHIA COLI SUSCEPTIBILITIES PERFORMED ON PREVIOUS CULTURE WITHIN THE LAST 5 DAYS. Performed at [facility], [address], [place], [place] [address]    Report Status 10/25/2024 FINAL  Final  Culture, blood (Routine x 2)     Status: Abnormal   Collection Time: 10/22/24 12:44 PM   Specimen: Left Antecubital; Blood  Result Value Ref Range Status   Specimen Description   Final    LEFT ANTECUBITAL Performed at [facility], [address], [place], [place] [address]    Special Requests   Final    BOTTLES DRAWN AEROBIC  AND ANAEROBIC Blood Culture results may not be optimal due to an inadequate volume of blood received in culture bottles Performed at [facility], [address] [place], [place] [address]    Culture  Setup Time   Final    GRAM NEGATIVE RODS IN BOTH AEROBIC AND ANAEROBIC BOTTLES CRITICAL RESULT CALLED TO, READ BACK BY AND VERIFIED WITH: NATHAN BELUE PHARMD @0103  10/23/24 ASW Performed at [facility], [address], [place], [place] [address]    Culture ESCHERICHIA COLI (A)  Final   Report Status 10/25/2024 FINAL  Final   Organism ID, Bacteria ESCHERICHIA COLI  Final      Susceptibility   Escherichia coli - MIC*    AMPICILLIN <=2 SENSITIVE Sensitive     CEFAZOLIN  (NON-URINE) <=1 SENSITIVE Sensitive     CEFEPIME  <=0.12 SENSITIVE Sensitive     ERTAPENEM <=0.12 SENSITIVE Sensitive     CEFTRIAXONE  <=0.25 SENSITIVE Sensitive     CIPROFLOXACIN  <=0.06 SENSITIVE Sensitive     GENTAMICIN  <=1 SENSITIVE Sensitive     MEROPENEM <=0.25 SENSITIVE Sensitive     TRIMETH/SULFA <=20 SENSITIVE Sensitive     AMPICILLIN/SULBACTAM <=2 SENSITIVE Sensitive     PIP/TAZO Value in next row Sensitive      <=4 SENSITIVEThis is a modified FDA-approved test that has been validated and its performance characteristics determined by the reporting laboratory.  This laboratory is certified under the Clinical Laboratory  Improvement Amendments CLIA as qualified to perform high complexity clinical laboratory testing.    * ESCHERICHIA COLI  Blood Culture ID Panel (Reflexed)     Status: Abnormal   Collection Time: 10/22/24 12:44 PM  Result Value Ref Range Status   Enterococcus faecalis NOT DETECTED NOT DETECTED Final   Enterococcus Faecium NOT DETECTED NOT DETECTED Final   Listeria monocytogenes NOT DETECTED NOT DETECTED Final   Staphylococcus species NOT DETECTED NOT DETECTED Final   Staphylococcus aureus (BCID) NOT DETECTED NOT DETECTED Final   Staphylococcus epidermidis NOT DETECTED NOT DETECTED Final   Staphylococcus lugdunensis NOT DETECTED NOT DETECTED Final   Streptococcus species NOT DETECTED NOT DETECTED Final   Streptococcus agalactiae NOT DETECTED NOT DETECTED Final   Streptococcus pneumoniae NOT DETECTED NOT DETECTED Final   Streptococcus pyogenes NOT DETECTED NOT DETECTED Final   A.calcoaceticus-baumannii NOT DETECTED NOT DETECTED Final   Bacteroides fragilis NOT DETECTED NOT DETECTED Final   Enterobacterales DETECTED (A) NOT DETECTED Final    Comment: Enterobacterales represent a large order of gram negative bacteria, not a single organism. CRITICAL RESULT CALLED TO, READ BACK BY AND VERIFIED WITH: NATHAN BELUE PHARMD @0103  10/23/24 ASW    Enterobacter cloacae complex NOT DETECTED NOT DETECTED Final   Escherichia coli DETECTED (A) NOT DETECTED Final    Comment: CRITICAL RESULT CALLED TO, READ BACK BY AND VERIFIED WITH: NATHAN BELUE PHARMD @0103  10/23/24 ASW    Klebsiella aerogenes NOT DETECTED NOT DETECTED Final   Klebsiella oxytoca NOT DETECTED NOT DETECTED Final   Klebsiella pneumoniae NOT DETECTED NOT DETECTED Final   Proteus species NOT DETECTED NOT DETECTED Final   Salmonella species NOT DETECTED NOT DETECTED Final   Serratia marcescens NOT DETECTED NOT DETECTED Final   Haemophilus influenzae NOT DETECTED NOT DETECTED Final   Neisseria meningitidis NOT DETECTED NOT DETECTED Final    Pseudomonas aeruginosa NOT DETECTED NOT DETECTED Final   Stenotrophomonas maltophilia NOT DETECTED NOT DETECTED Final   Candida albicans NOT DETECTED NOT DETECTED Final   Candida auris NOT DETECTED NOT DETECTED Final   Candida  glabrata NOT DETECTED NOT DETECTED Final   Candida krusei NOT DETECTED NOT DETECTED Final   Candida parapsilosis NOT DETECTED NOT DETECTED Final   Candida tropicalis NOT DETECTED NOT DETECTED Final   Cryptococcus neoformans/gattii NOT DETECTED NOT DETECTED Final   CTX-M ESBL NOT DETECTED NOT DETECTED Final   Carbapenem resistance IMP NOT DETECTED NOT DETECTED Final   Carbapenem resistance KPC NOT DETECTED NOT DETECTED Final   Carbapenem resistance NDM NOT DETECTED NOT DETECTED Final   Carbapenem resist OXA 48 LIKE NOT DETECTED NOT DETECTED Final   Carbapenem resistance VIM NOT DETECTED NOT DETECTED Final    Comment: Performed at [facility], [address], [place], [place] [address]  Urine Culture     Status: Abnormal   Collection Time: 10/22/24  1:17 PM   Specimen: Urine, Random  Result Value Ref Range Status   Specimen Description   Final    URINE, RANDOM Performed at [facility], [address] [place], [place] [address]    Special Requests   Final    NONE Reflexed from [id number] Performed at [facility], [address] [place], [place] [address]    Culture >=100,000 COLONIES/mL ESCHERICHIA COLI (A)  Final   Report Status 10/24/2024 FINAL  Final   Organism ID, Bacteria ESCHERICHIA COLI (A)  Final      Susceptibility   Escherichia coli - MIC*    AMPICILLIN <=2 SENSITIVE Sensitive     CEFAZOLIN  (URINE) Value in next row Sensitive      <=1 SENSITIVEThis is a modified FDA-approved test that has been validated and its performance characteristics determined by the reporting laboratory.  This laboratory is certified under the Clinical Laboratory Improvement Amendments CLIA as qualified to perform high complexity clinical  laboratory testing.    CEFEPIME  Value in next row Sensitive      <=1 SENSITIVEThis is a modified FDA-approved test that has been validated and its performance characteristics determined by the reporting laboratory.  This laboratory is certified under the Clinical Laboratory Improvement Amendments CLIA as qualified to perform high complexity clinical laboratory testing.    ERTAPENEM Value in next row Sensitive      <=1 SENSITIVEThis is a modified FDA-approved test that has been validated and its performance characteristics determined by the reporting laboratory.  This laboratory is certified under the Clinical Laboratory Improvement Amendments CLIA as qualified to perform high complexity clinical laboratory testing.    CEFTRIAXONE  Value in next row Sensitive      <=1 SENSITIVEThis is a modified FDA-approved test that has been validated and its performance characteristics determined by the reporting laboratory.  This laboratory is certified under the Clinical Laboratory Improvement Amendments CLIA as qualified to perform high complexity clinical laboratory testing.    CIPROFLOXACIN  Value in next row Sensitive      <=1 SENSITIVEThis is a modified FDA-approved test that has been validated and its performance characteristics determined by the reporting laboratory.  This laboratory is certified under the Clinical Laboratory Improvement Amendments CLIA as qualified to perform high complexity clinical laboratory testing.    GENTAMICIN  Value in next row Sensitive      <=1 SENSITIVEThis is a modified FDA-approved test that has been validated and its performance characteristics determined by the reporting laboratory.  This laboratory is certified under the Clinical Laboratory Improvement Amendments CLIA as qualified to perform high complexity clinical laboratory testing.    NITROFURANTOIN Value in next row Sensitive      <=1 SENSITIVEThis is a modified FDA-approved test  that has been validated and its performance  characteristics determined by the reporting laboratory.  This laboratory is certified under the Clinical Laboratory Improvement Amendments CLIA as qualified to perform high complexity clinical laboratory testing.    TRIMETH/SULFA Value in next row Sensitive      <=1 SENSITIVEThis is a modified FDA-approved test that has been validated and its performance characteristics determined by the reporting laboratory.  This laboratory is certified under the Clinical Laboratory Improvement Amendments CLIA as qualified to perform high complexity clinical laboratory testing.    AMPICILLIN/SULBACTAM Value in next row Sensitive      <=1 SENSITIVEThis is a modified FDA-approved test that has been validated and its performance characteristics determined by the reporting laboratory.  This laboratory is certified under the Clinical Laboratory Improvement Amendments CLIA as qualified to perform high complexity clinical laboratory testing.    PIP/TAZO Value in next row Sensitive      <=4 SENSITIVEThis is a modified FDA-approved test that has been validated and its performance characteristics determined by the reporting laboratory.  This laboratory is certified under the Clinical Laboratory Improvement Amendments CLIA as qualified to perform high complexity clinical laboratory testing.    MEROPENEM Value in next row Sensitive      <=4 SENSITIVEThis is a modified FDA-approved test that has been validated and its performance characteristics determined by the reporting laboratory.  This laboratory is certified under the Clinical Laboratory Improvement Amendments CLIA as qualified to perform high complexity clinical laboratory testing.    * >=100,000 COLONIES/mL ESCHERICHIA COLI         Radiology Studies: MR BRAIN W WO CONTRAST Result Date: 10/26/2024 EXAM: MRI BRAIN WITH AND WITHOUT CONTRAST 10/26/2024 07:20:10 PM TECHNIQUE: Multiplanar multisequence MRI of the head/brain was performed with and without the administration  of intravenous contrast. CONTRAST: 6 mL of Gadobutrol . COMPARISON: MR Head Wo/w Cm 06/28/2024. CLINICAL HISTORY: Altered mental status change, presumed secondary to urinary tract infection. Acute neuro deficit, stroke suspected. Striae of brain metastasis. Sepsis criteria with tachycardia, leukocytosis, elevated lactic. FINDINGS: BRAIN AND VENTRICLES: Diffuse confluent hyperintense T2-weighted signal within the bilateral cerebral white matter. Multifocal chronic hemorrhage associated with previously treated metastases. There are 5 intraparenchymal metastases visible on the current study. The number is greatly reduced from the prior examination, although there are technical differences between the 2 scans that may hide some of the smaller lesions on the current study. The lesions are best seen on the postcontrast sagittal sequence. The right temporal lesion has decreased in size (image 5). The posterior right parietal lesion has decreased in size (image 6). The superior right cerebellar lesion has slightly decreased in size (image 10). The posterior right parietal lesion has decreased in size (paren image 12). The superior left cerebellar lesion has slightly decreased in size (image 19). No acute infarct. No acute intracranial hemorrhage. No mass effect or midline shift. No hydrocephalus. The sella is unremarkable. Normal flow voids. ORBITS: No acute abnormality. SINUSES: Right mastoid effusion. BONES AND SOFT TISSUES: Normal bone marrow signal and enhancement. No acute soft tissue abnormality. IMPRESSION: 1. No acute intracranial abnormality 2. Five intraparenchymal metastases, decreased in number and size from the prior exam, acknowledging technical differences that may obscure smaller lesions. 3. Diffuse confluent T2 hyperintensity in the bilateral cerebral white matter, likely a sequela of radiation treatment. Electronically signed by: Franky Stanford MD 10/26/2024 11:55 PM EST RP Workstation: [id number]         Scheduled Meds:  amLODipine   5 mg Oral Daily   apixaban   5 mg Oral BID   Chlorhexidine  Gluconate Cloth  6 each Topical Daily   dexamethasone   4 mg Oral Daily   feeding supplement  237 mL Oral TID BM   insulin  aspart  0-5 Units Subcutaneous QHS   insulin  aspart  0-9 Units Subcutaneous TID WC   mirtazapine   15 mg Oral QHS   multivitamin with minerals  1 tablet Oral Daily   OLANZapine   5 mg Oral QHS   potassium chloride   40 mEq Oral Once   sodium chloride  flush  10-40 mL Intracatheter Q12H   thiamine   100 mg Oral Daily   traZODone   50 mg Oral QHS   Continuous Infusions:  cefTRIAXone  (ROCEPHIN )  IV Stopped (10/27/24 2329)   dextrose  100 mL/hr at 10/28/24 0643   magnesium  sulfate bolus IVPB       LOS: 5 days       Anthony CHRISTELLA Pouch, MD [facility] Pager [phone]  If 7PM-7AM, please contact night-coverage [email or web address] 10/28/2024, 9:59 AM

## 2024-10-28 NOTE — Progress Notes (Addendum)
 Occupational Therapy Treatment Patient Details Name: Doris Johnson MRN: [id number] DOB: 01-26-1951 Today's Date: 10/28/2024   History of present illness Pt is a 73 year old female presents to the ED for evaluation of decreased urinary output, lethargy, altered mentation admitted with sepsis, acute metabolic encephalopathy. PMH significant for lung cancer with diffuse metastasis, non-insulin -dependent diabetes mellitus, hypertension, hypothyroidism   OT comments  Pt received with NT feeding pt lunch, daughter and granddaughters in room for part of session. Pt is not able to follow commands, alert & restless, and remains non-verbal throughout. Per family, pt was able to transfer and ambulate several months ago until decline. Pt requires MAX A +2 for bed mobility, is able to dangle at EOB with MAX A for static sitting balance 2/2 heavy posterior lean, and is left positioned in sidelying with NT present. OT edu on need for assist for all ADLs at discharge, DME recommendations and frequent repositioning for pressure relief. Discussed turning, and positioning of UE/LE to protect skin integrity, family verbalizes understanding.       If plan is discharge home, recommend the following:  Supervision due to cognitive status;Two people to help with walking and/or transfers;Two people to help with bathing/dressing/bathroom   Equipment Recommendations  Wheelchair cushion (measurements OT);Hospital bed;Hoyer lift;Wheelchair (measurements OT)       Precautions / Restrictions Precautions Precautions: Fall Recall of Precautions/Restrictions: Impaired Restrictions Weight Bearing Restrictions Per Provider Order: No       Mobility Bed Mobility Overal bed mobility: Needs Assistance Bed Mobility: Supine to Sit, Sit to Supine, Rolling Rolling: Max assist, +2 for physical assistance   Supine to sit: Max assist, +2 for physical assistance, +2 for safety/equipment Sit to supine: Max assist, +2 for  physical assistance, +2 for safety/equipment        Transfers Overall transfer level: Needs assistance                 General transfer comment: unsafe to attempt     Balance Overall balance assessment: Needs assistance Sitting-balance support: Feet supported Sitting balance-Leahy Scale: Zero Sitting balance - Comments: MAX A to maintain static sitting balance, heavy posterior lean, OT sits in chair in front of patient as she seems fearful of falling. pt restless, playing with clothing items Postural control: Posterior lean                                 ADL either performed or assessed with clinical judgement   ADL Overall ADL's : Needs assistance/impaired Eating/Feeding: Sitting;Maximal assistance Eating/Feeding Details (indicate cue type and reason): NT in room feeding patient. With OT guidance, pt is placed in chair position while eating for upright posture.                                 Functional mobility during ADLs: Total assistance;Cueing for safety;Cueing for sequencing;Maximal assistance;+2 for physical assistance General ADL Comments: MAX - TOTAL A +2 to transition towards EOB. Pt internally/externally distracted, unable to follow commands & restless.     Communication Communication Communication: Impaired Factors Affecting Communication: Difficulty expressing self;Reduced clarity of speech   Cognition Arousal: Alert Behavior During Therapy: Flat affect, Restless, Anxious Cognition: Cognition impaired   Orientation impairments: Person, Place, Time, Situation Awareness: Intellectual awareness impaired, Online awareness impaired Memory impairment (select all impairments): Short-term memory, Working civil service fast streamer, Engineer, structural memory Attention impairment (select first  level of impairment): Focused attention Executive functioning impairment (select all impairments): Initiation, Organization, Reasoning, Sequencing, Problem  solving OT - Cognition Comments: pt non-speaking throughout session. restless, pulling at blankets, clothes, etc. does not respond to questions or follow any commands. occassionally smiles at family members but has decreased awareness of situation/surroundings.                 Following commands: Impaired Following commands impaired: Follows one step commands inconsistently (does not follow any commands)      Cueing   Cueing Techniques: Verbal cues, Gestural cues  Exercises Exercises: Other exercises Other Exercises Other Exercises: family in room for part of session, OT edu on DME recommendations for care at home. problem-solving use of hospital bed and transfers (w/c, BSC) with hoyer lift Other Exercises: Edu on repositoining frequently to prevent pressure sores, turning and floating heels, positioning of BUE            Pertinent Vitals/ Pain       Pain Assessment Pain Assessment: No/denies pain   Frequency  Min 2X/week        Progress Toward Goals  OT Goals(current goals can now be found in the care plan section)  Progress towards OT goals: Progressing toward goals  Acute Rehab OT Goals OT Goal Formulation: Patient unable to participate in goal setting Time For Goal Achievement: 11/06/24 ADL Goals Pt Will Perform Grooming: with supervision;sitting;standing Pt Will Perform Lower Body Dressing: with supervision;sitting/lateral leans;sit to/from stand Pt Will Transfer to Toilet: with supervision;stand pivot transfer Pt Will Perform Toileting - Clothing Manipulation and hygiene: with supervision;sitting/lateral leans;sit to/from stand  Plan         AM-PAC OT 6 Clicks Daily Activity     Outcome Measure   Help from another person eating meals?: A Lot Help from another person taking care of personal grooming?: A Lot Help from another person toileting, which includes using toliet, bedpan, or urinal?: Total Help from another person bathing (including washing,  rinsing, drying)?: A Lot Help from another person to put on and taking off regular upper body clothing?: A Lot Help from another person to put on and taking off regular lower body clothing?: A Lot 6 Click Score: 11    End of Session    OT Visit Diagnosis: Other abnormalities of gait and mobility (R26.89);Muscle weakness (generalized) (M62.81)   Activity Tolerance Patient tolerated treatment well   Patient Left in bed;with call bell/phone within reach;with bed alarm set;with nursing/sitter in room   Nurse Communication Mobility status, chest port line with blood, charge RN in to assess.         Time: 8691-8657 OT Time Calculation (min): 34 min  Charges: OT General Charges $OT Visit: 1 Visit OT Treatments $Self Care/Home Management : 23-37 mins Gil Ingwersen L. Uno Esau, OTR/L  10/28/2024, 3:05 PM

## 2024-10-28 NOTE — TOC Progression Note (Addendum)
 Transition of Care [facility]) - Progression Note    Patient Details  Name: Doris Johnson MRN: [id number] Date of Birth: 1951-01-16  Transition of [facility]) CM/SW Contact  [name], RN Phone Number: 10/28/2024, 10:35 AM  Clinical Narrative:     Message sent to MD to determine when it is anticipate patient will medically be ready for DC.  Will need to submit hospital bed referral at least the day before dc    Update:  Per MD not medically appropriate for dc at this time   Barriers to Discharge: Continued Medical Work up               Expected Discharge Plan and Services       Living arrangements for the past 2 months: Apartment                                       Social Drivers of Health (SDOH) Interventions SDOH Screenings   Food Insecurity: No Food Insecurity (08/16/2024)   Received from [facility]  Housing: Low Risk  (08/16/2024)   Received from [facility]  Transportation Needs: No Transportation Needs (08/16/2024)   Received from [facility]  Utilities: Not At Risk (08/16/2024)   Received from [facility]  Depression [phone]): Low Risk (07/02/2024)  Financial Resource Strain: Low Risk  (08/16/2024)   Received from [facility]  Tobacco Use: Low Risk (10/25/2024)  Recent Concern: Tobacco Use - Medium Risk (10/07/2024)   Received from [facility]    Readmission Risk Interventions    10/25/2024    9:42 AM  Readmission Risk Prevention Plan  Transportation Screening Complete  PCP or Specialist Appt within 3-5 Days Complete  Palliative Care Screening Complete  Medication Review (RN Care Manager) Complete

## 2024-10-28 NOTE — Progress Notes (Signed)
 Spoke with Dr. Lawence via secure chat & requested orders to modify Ativan  to PRN for agitation/confusion as well as a 1:1 recruitment consultant. Pt is only A&Oxself and unable to follow commands. Orders have been updated.

## 2024-10-29 DIAGNOSIS — C3492 Malignant neoplasm of unspecified part of left bronchus or lung: Secondary | ICD-10-CM

## 2024-10-29 DIAGNOSIS — G9340 Encephalopathy, unspecified: Secondary | ICD-10-CM | POA: Diagnosis not present

## 2024-10-29 LAB — BASIC METABOLIC PANEL WITH GFR
Anion gap: 8 (ref 5–15)
BUN: 12 mg/dL (ref 8–23)
CO2: 24 mmol/L (ref 22–32)
Calcium: 9.5 mg/dL (ref 8.9–10.3)
Chloride: 108 mmol/L (ref 98–111)
Creatinine, Ser: 0.61 mg/dL (ref 0.44–1.00)
GFR, Estimated: >60 mL/min (ref >60)
Glucose, Bld: 129 mg/dL — ABNORMAL HIGH (ref 70–99)
Potassium: 4.7 mmol/L (ref 3.5–5.1)
Sodium: 140 mmol/L (ref 135–145)

## 2024-10-29 LAB — GLUCOSE, CAPILLARY
Glucose-Capillary: 111 mg/dL — ABNORMAL HIGH (ref 70–99)
Glucose-Capillary: 113 mg/dL — ABNORMAL HIGH (ref 70–99)
Glucose-Capillary: 112 mg/dL — ABNORMAL HIGH (ref 70–99)
Glucose-Capillary: 134 mg/dL — ABNORMAL HIGH (ref 70–99)

## 2024-10-29 LAB — CBC
HCT: 30.4 % — ABNORMAL LOW (ref 36.0–46.0)
Hemoglobin: 10.3 g/dL — ABNORMAL LOW (ref 12.0–15.0)
MCH: 29.7 pg (ref 26.0–34.0)
MCHC: 33.9 g/dL (ref 30.0–36.0)
MCV: 87.6 fL (ref 80.0–100.0)
Platelets: 142 K/uL — ABNORMAL LOW (ref 150–400)
RBC: 3.47 MIL/uL — ABNORMAL LOW (ref 3.87–5.11)
RDW: 16.6 % — ABNORMAL HIGH (ref 11.5–15.5)
WBC: 5.6 K/uL (ref 4.0–10.5)
nRBC: 0.4 % — ABNORMAL HIGH (ref 0.0–0.2)

## 2024-10-29 NOTE — Progress Notes (Signed)
 Nutrition Follow Up Note   DOCUMENTATION CODES:   Severe malnutrition in context of chronic illness  INTERVENTION:   Ensure Plus High Protein po TID, each supplement provides 350 kcal and 20 grams of protein  Magic cup TID with meals, each supplement provides 290 kcal and 9 grams of protein  MVI po daily   Thiamine  100mg  IV daily x 7 days   Pt remains at high refeed risk; recommend monitor potassium, magnesium  and phosphorus labs daily until stable  Daily weights   NUTRITION DIAGNOSIS:   Severe Malnutrition related to chronic illness (metastatic cancer) as evidenced by severe muscle depletion, severe fat depletion, 22 percent weight loss in 6 months. -ongoing   GOAL:   Patient will meet greater than or equal to 90% of their needs -not met   MONITOR:   PO intake, Supplement acceptance, Labs, Weight trends, I & O's, Skin  ASSESSMENT:   73 y/o female with h/o HLD, DM, metastatic SCC lung on chemotherapy, HTN, lymphedema, hypothyroidism, GERD and VTE who is admitted with UTI, sepsis, AMS and bacteremia.  Pt with poor appetite and oral intake in hospital. Pt eating sips/bites when she eats but refuses most meals. Supplements being provided by nursing and on meal trays. Pt remains at high refeed risk. Palliative care following; family wishes to continue with full scope of care at this time. Short term dobhoff tube placement and nutrition support can be considered to allow time for outcomes. G-tube is not recommended as it would not likely change pt's outcomes or improve her quality of life. Per chart, pt is up ~6lbs from admission. Pt +3.0L on her I & Os.    Medications reviewed and include: dexamethasone , insulin , remeron , MVI, thiamine    Labs reviewed: Na 140 wnl, K 4.7 wnl P 2.4(L)- 12/15 Hgb 10.3(L), Hct 30.4(L) Cbgs- 112, 113, 111 x 24 hrs   UOP-   Diet Order:   Diet Order             DIET DYS 2 Room service appropriate? Yes; Fluid consistency: Thin  Diet  effective now                  EDUCATION NEEDS:   Not appropriate for education at this time  Skin:  Skin Assessment: Reviewed RN Assessment (Stage I buttocks)  Last BM:  12/13  Height:   Ht Readings from Last 1 Encounters:  10/22/24 5' 3 (1.6 m)    Weight:   Wt Readings from Last 1 Encounters:  10/29/24 64.1 kg    Ideal Body Weight:  52.3 kg  BMI:  Body mass index is 25.03 kg/m.  Estimated Nutritional Needs:   Kcal:  1700-1900kcal/day  Protein:  85-95g/day  Fluid:  1.4-1.6L/day  Augustin Shams MS, RD, LDN If unable to be reached, please send secure chat to RD inpatient available from 8:00a-4:00p daily

## 2024-10-29 NOTE — Progress Notes (Signed)
 Palliative Care Progress Note, Assessment & Plan   Patient Name: Doris Johnson       Date: 10/29/2024 DOB: Mar 28, 1951  Age: 73 y.o. MRN#: [id number] Attending Physician: Doris Anthony HERO, MD Primary Care Physician: Doris Alm [name], MD Admit Date: 10/22/2024  Subjective: Patient is lying in bed on her right side, asleep.  She does not awaken to my verbal or light physical stimuli.  Respirations are even and unlabored.  Nurse tech is at bedside during my visit.  HPI: 73 y.o. female  with past medical history of lung cancer with diffuse metastasis, non-insulin -dependent diabetes mellitus, hypertension, hypothyroidism admitted on 10/22/2024 with AMS and lethargy.   As per chart review, pt presented to ED for evaluation of decreased urine output, lethargy, and altered mentation over the past several days PTA.    Workup revealed blood cultures positive for E. Coli bacteremia. Pt is being treated for E.coli bacteremia, decreased urine output and oral intake, acute metabolic encephalopathy, hypernatremia, and (suspected) severe protein calorie malnutrition.   Summary of counseling/coordination of care: Extensive chart review completed prior to meeting patient including labs, vital signs, imaging, progress notes, orders, and available advanced directive documents from current and previous encounters.   After reviewing the patient's chart and assessing the patient at bedside, I spoke with patient in regards to symptom management and goals of care.   Patient remains unable to participate in goals of care or medical decision making independently at this time.  Nonverbal symptom assessment completed.  No brow furrowing, fidgeting, groaning/moaning, grimacing, hide no other nonverbal signs of pain or  discomfort not noted.  No adjustment to [facility] needed at this time.  As per nurse check at bedside, patient has not had anything to eat or drink this morning.  She has been awakened and offered several times but has repeatedly declined oral intake.  Patient has had no urine output since 400 cc were documented at 6:00 this morning.  Nurse tech at bedside confirms patient has not made any urine since her shift started this morning at 7 AM.  After assessing the patient at bedside, I spoke with her daughter Doris Johnson over the phone.  Brief medical update given.  I shared my concern that patient is not making urine, sleeping most of the day, and refusing all p.o. intake.  Discussed significance of functional, nutritional, and cognitive status as significant indicators of patient's overall prognosis.  Shared I remain hopeful but have also realistic that patient may not be able to bounce back as we had discussed previously.  Space opportunity provided for [place] share her thoughts and emotions regarding patient's current medical situation.  We discussed that patient's family met with hospice services this past weekend.  Doris Johnson shares at that time that it did not seem appropriate to have hospice services.  However, I cautioned that perhaps she could reconsider hospice services if patient continues to choke signs of decline.  Reviewed that [facility] oncology recommended Decadron  every 8 hours for 2 days.  Doris Johnson was aware and is hopeful that this will help patient perk up.  Discussed treatment mortality and the limitations of the human body to recover given patient's metastatic disease.  Doris Johnson shares understanding and  remains hopeful that patient will improve.  No change to plan of care at this time.    PMT will continue to follow and support.  Of note, patient's daughter request that all calls that are nonurgent be placed to her after 330 if she is unable to find coverage to take a phone call while she is at  work during the day.  Physical Exam Vitals reviewed.  Constitutional:      Comments: Thin, frail  HENT:     Head:     Comments: Temporal wasting Cardiovascular:     Pulses: Normal pulses.  Pulmonary:     Effort: Pulmonary effort is normal.  Abdominal:     Palpations: Abdomen is soft.  Musculoskeletal:     Comments: Generalized weakness  Skin:    General: Skin is warm and dry.  Neurological:     Comments: nonverbal              Recommendations:   Full code remains Ongoing support per daughter as patient is not showing signs of improvement  35 minute visit includes: Detailed review of medical records (labs, imaging, vital signs), medically appropriate exam (mental status, respiratory, cardiac, skin), discussed with treatment team, counseling and educating patient, family and staff, documenting clinical information, medication management and coordination of care.  Doris L. Arvid, DNP, FNP-BC Palliative Medicine Team

## 2024-10-29 NOTE — Consult Note (Signed)
 Hematology/Oncology Consult note [facility] [phone] [phone]  Patient Care Team: Epifanio Alm SQUIBB, MD as PCP - General (Infectious Diseases) Verdene Gills, RN as Oncology Nurse Navigator Melanee Annah BROCKS, MD as Consulting Physician (Oncology)   Name of the patient: Doris Johnson  [id number]  Aug 13, 1951    Reason for consult: Metastatic small cell lung cancer   Requesting physician: Dr. Georganna Pouch  Date of visit: 10/29/2024    History of presenting illness-patient is a 73 year old female with history of metastatic small cell lung cancer with brain, liver and lymph node metastases diagnosed In October 2021.  She went through multiple lines of treatment including carbo etoposide  Tecentriq  followed by lurbinectedin .  She also received palliative radiation therapy in the past.  She was referred to [facility] in August 2025 for progressive disease and for consideration for bispecific treatment with tarlatamab.  She did have her last scan in November 2025 at [facility] which showed interim response to treatment as evidenced by decrease in the size of her neck node as well as brain metastases.  Patient has undergone 2 rounds of whole brain radiation for brain metastases in the past.  Despite improvement in her scans patient has been gradually declining in her performance status.  When I had seen her back in August 2025 she used to come alone for her treatment and was ambulating well and independent of her ADLs and IADLs.  Her weight was stable back then.  Presently patient is talking very little and her oral intake has gone down.  She is currently being given IV steroids as well as IV antibiotics for E. coli UTI but there has not been much of an improvement in her mental status.  She was not able to recognize me when I went for my visit today.  I did speak to her daughter over the phone who tells me that patient lives with her and is able to ambulate to  some extent but the main concern has been decreasing her oral intake and generalized weakness  ECOG PS- 3    Review of systems- Review of Systems  Unable to perform ROS: Mental acuity    Allergies[1]  Patient Active Problem List   Diagnosis Date Noted   Hypophosphatemia 10/26/2024   Failure to thrive in adult 10/26/2024   Pressure injury of skin 10/25/2024   Altered mental status 10/25/2024   Protein-calorie malnutrition, severe 10/23/2024   Sepsis secondary to UTI (HCC) 10/22/2024   Metastasis to brain (HCC) 04/04/2024   Chronic venous insufficiency 09/13/2021   Lymphedema 09/13/2021   Pain and swelling of lower leg 09/13/2021   Carotid bruit 09/13/2021   Small cell carcinoma of lung metastatic to liver (HCC) 10/30/2020   Goals of care, counseling/discussion 10/30/2020   Constipation 09/02/2020   Weight loss of more than 10% body weight 09/02/2020   Diabetes mellitus, type 2 (HCC) 01/20/2016   Big thyroid  01/20/2016   Adiposity 01/20/2016   Hypercholesteremia 01/08/2016   Adult BMI 30+ 12/10/2015   BP (high blood pressure) 12/10/2015   Allergic rhinitis 08/10/2015     Past Medical History:  Diagnosis Date   Cancer (HCC)    Cataract    Diabetes mellitus without complication (HCC)    Hyperlipidemia    Hypertension    Hypothyroidism    doctor's keeping an eye on thyroid     Lung cancer [facility])      Past Surgical History:  Procedure Laterality Date   ABDOMINAL HYSTERECTOMY  IR CV LINE INJECTION  09/28/2021   MYRINGOTOMY WITH TUBE PLACEMENT Right 12/29/2022   Procedure: MYRINGOTOMY WITH TUBE PLACEMENT;  Surgeon: Juengel, Paul, MD;  Location: [facility];  Service: ENT;  Laterality: Right;  Diabetic   PORTA CATH INSERTION N/A 11/30/2020   Procedure: PORTA CATH INSERTION;  Surgeon: Marea Selinda RAMAN, MD;  Location: [facility] INVASIVE CV LAB;  Service: Cardiovascular;  Laterality: N/A;   VIDEO BRONCHOSCOPY WITH ENDOBRONCHIAL NAVIGATION N/A 10/21/2020   Procedure:  VIDEO BRONCHOSCOPY WITH ENDOBRONCHIAL NAVIGATION;  Surgeon: Parris Manna, MD;  Location: [facility] ORS;  Service: Thoracic;  Laterality: N/A;   VIDEO BRONCHOSCOPY WITH ENDOBRONCHIAL ULTRASOUND N/A 10/21/2020   Procedure: VIDEO BRONCHOSCOPY WITH ENDOBRONCHIAL ULTRASOUND;  Surgeon: Parris Manna, MD;  Location: [facility] ORS;  Service: Thoracic;  Laterality: N/A;    Social History   Socioeconomic History   Marital status: Single    Spouse name: Not on file   Number of children: Not on file   Years of education: Not on file   Highest education level: Not on file  Occupational History   Not on file  Tobacco Use   Smoking status: Never   Smokeless tobacco: Never  Vaping Use   Vaping status: Never Used  Substance and Sexual Activity   Alcohol use: No   Drug use: No   Sexual activity: Not Currently  Other Topics Concern   Not on file  Social History Narrative   Not on file   Social Drivers of Health   Tobacco Use: Low Risk (10/25/2024)   Patient History    Smoking Tobacco Use: Never    Smokeless Tobacco Use: Never    Passive Exposure: Not on file  Recent Concern: Tobacco Use - Medium Risk (10/07/2024)   Received [facility]   Patient History    Smoking Tobacco Use: Former    Smokeless Tobacco Use: Never    Passive Exposure: Not on file  Financial Resource Strain: Low Risk  (08/16/2024)   Received from [facility]   Overall Financial Resource Strain (CARDIA)    Difficulty of Paying Living Expenses: Not hard at all  Food Insecurity: No Food Insecurity (08/16/2024)   Received from [facility]   Epic    Within the past 12 months, you worried that your food would run out before you got the money to buy more.: Never true    Within the past 12 months, the food you bought just didn't last and you didn't have money to get more.: Never true  Transportation Needs: No Transportation Needs (08/16/2024)   Received from [facility]    In the past 12 months, has lack of transportation kept you from medical appointments or from getting medications?: No    Lack of Transportation (Non-Medical): No  Physical Activity: Not on file  Stress: Not on file  Social Connections: Not on file  Intimate Partner Violence: Not At Risk (04/04/2024)   Humiliation, Afraid, Rape, and Kick questionnaire    Fear of Current or Ex-Partner: No    Emotionally Abused: No    Physically Abused: No    Sexually Abused: No  Depression (PHQ2-9): Low Risk (07/02/2024)   Depression (PHQ2-9)    PHQ-2 Score: 0  Alcohol Screen: Not on file  Housing: Low Risk  (08/16/2024)   Received from [facility]   Epic    In the last 12 months, was there a time when you  were not able to pay the mortgage or rent on time?: No    In the past 12 months, how many times have you moved where you were living?: 0    At any time in the past 12 months, were you homeless or living in a shelter (including now)?: No  Utilities: Not At Risk (08/16/2024)   Received from [facility]   Epic    In the past 12 months has the electric, gas, oil, or water company threatened to shut off services in your home?: No  Health Literacy: Not on file     Family History  Problem Relation Age of Onset   Hypertension Mother    Breast cancer Mother 36   Heart disease Father    Hyperlipidemia Brother    Heart disease Brother     Current Medications[2]   Physical exam:  Vitals:   10/29/24 0415 10/29/24 0430 10/29/24 0728 10/29/24 1312  BP:  113/74 110/71 131/79  Pulse:  68 (!) 52 (!) 54  Resp:  16 18 18   Temp: 99.8 F (37.7 C) 98.4 F (36.9 C) (!) 96.5 F (35.8 C) (!) 96.5 F (35.8 C)  TempSrc: Rectal Axillary    SpO2:  100% 100% 100%  Weight:      Height:       Physical Exam Constitutional:      Comments: Patient is sleeping and is able to be aroused but does not communicate much  Cardiovascular:      Rate and Rhythm: Normal rate and regular rhythm.     Heart sounds: Normal heart sounds.  Pulmonary:     Effort: Pulmonary effort is normal.     Breath sounds: Normal breath sounds.  Abdominal:     General: Bowel sounds are normal.     Palpations: Abdomen is soft.  Skin:    General: Skin is warm and dry.  Neurological:     Comments: Oriented to self alone           Latest Ref Rng & Units 10/29/2024   10:21 AM  CMP  Glucose 70 - 99 mg/dL 870   BUN 8 - 23 mg/dL 12   Creatinine 9.55 - 1.00 mg/dL 9.38   Sodium 864 - 854 mmol/L 140   Potassium 3.5 - 5.1 mmol/L 4.7   Chloride 98 - 111 mmol/L 108   CO2 22 - 32 mmol/L 24   Calcium 8.9 - 10.3 mg/dL 9.5       Latest Ref Rng & Units 10/29/2024   10:21 AM  CBC  WBC 4.0 - 10.5 K/uL 5.6   Hemoglobin 12.0 - 15.0 g/dL 89.6   Hematocrit 63.9 - 46.0 % 30.4   Platelets 150 - 400 K/uL 142     @IMAGES @  MR BRAIN W WO CONTRAST Result Date: 10/26/2024 EXAM: MRI BRAIN WITH AND WITHOUT CONTRAST 10/26/2024 07:20:10 PM TECHNIQUE: Multiplanar multisequence MRI of the head/brain was performed with and without the administration of intravenous contrast. CONTRAST: 6 mL of Gadobutrol . COMPARISON: MR Head Wo/w Cm 06/28/2024. CLINICAL HISTORY: Altered mental status change, presumed secondary to urinary tract infection. Acute neuro deficit, stroke suspected. Striae of brain metastasis. Sepsis criteria with tachycardia, leukocytosis, elevated lactic. FINDINGS: BRAIN AND VENTRICLES: Diffuse confluent hyperintense T2-weighted signal within the bilateral cerebral white matter. Multifocal chronic hemorrhage associated with previously treated metastases. There are 5 intraparenchymal metastases visible on the current study. The number is greatly reduced from the prior examination, although there are technical differences between the 2  scans that may hide some of the smaller lesions on the current study. The lesions are best seen on the postcontrast sagittal  sequence. The right temporal lesion has decreased in size (image 5). The posterior right parietal lesion has decreased in size (image 6). The superior right cerebellar lesion has slightly decreased in size (image 10). The posterior right parietal lesion has decreased in size (paren image 12). The superior left cerebellar lesion has slightly decreased in size (image 19). No acute infarct. No acute intracranial hemorrhage. No mass effect or midline shift. No hydrocephalus. The sella is unremarkable. Normal flow voids. ORBITS: No acute abnormality. SINUSES: Right mastoid effusion. BONES AND SOFT TISSUES: Normal bone marrow signal and enhancement. No acute soft tissue abnormality. IMPRESSION: 1. No acute intracranial abnormality 2. Five intraparenchymal metastases, decreased in number and size from the prior exam, acknowledging technical differences that may obscure smaller lesions. 3. Diffuse confluent T2 hyperintensity in the bilateral cerebral white matter, likely a sequela of radiation treatment. Electronically signed by: Franky Stanford MD 10/26/2024 11:55 PM EST RP Workstation: [id number]   DG Abd Portable 1V Result Date: 10/25/2024 EXAM: 1 VIEW XRAY OF THE ABDOMEN 10/25/2024 09:43:03 PM COMPARISON: None available. CLINICAL HISTORY: Mental status change, unknown cause; Neuro deficit, acute, stroke suspected; [phone] Encounter for imaging to screen for metal prior to magnetic resonance imaging (MRI) [id number]; Striae of brain metastasis FINDINGS: BOWEL: Nonobstructive bowel gas pattern. SOFT TISSUES: No radiopaque foreign body is seen. No abnormal calcifications. BONES: No acute fracture. IMPRESSION: 1. No radiopaque foreign body. Electronically signed by: Pinkie Pebbles MD 10/25/2024 09:46 PM EST RP Workstation: [id number]   CT Head Wo Contrast Result Date: 10/22/2024 CLINICAL DATA:  Altered mental status.  Metastatic lung carcinoma. EXAM: CT HEAD WITHOUT CONTRAST TECHNIQUE: Contiguous axial images were obtained  from the base of the skull through the vertex without intravenous contrast. RADIATION DOSE REDUCTION: This exam was performed according to the departmental dose-optimization program which includes automated exposure control, adjustment of the mA and/or kV according to patient size and/or use of iterative reconstruction technique. COMPARISON:  Head MRI on 06/28/2024 FINDINGS: Brain: No evidence of intracranial hemorrhage, acute infarction, hydrocephalus, or extra-axial collection. Mild vasogenic edema is again seen in the high right parietal lobe at site of metastasis visualized on prior MRI. No evidence of mass effect or midline shift. Stable mild diffuse cerebral atrophy and moderate chronic small vessel disease. Vascular:  No hyperdense vessel or other acute findings. Skull: No evidence of fracture or other significant bone abnormality. Sinuses/Orbits:  Right mastoid effusion again noted. Other: None. IMPRESSION: No significant change in mild vasogenic edema in the high right parietal lobe at site of metastasis visualized on prior MRI. No evidence of intracranial hemorrhage, mass effect or midline shift. Stable mild cerebral atrophy and moderate chronic small vessel disease. Stable right mastoid effusion. Electronically Signed   By: Norleen DELENA Kil M.D.   On: 10/22/2024 14:40   DG Chest Port 1 View if patient is in a treatment room. Result Date: 10/22/2024 CLINICAL DATA:  Suspected Sepsis EXAM: PORTABLE CHEST - 1 VIEW COMPARISON:  CT chest abdomen pelvis 06/27/2024 and chest radiograph 10/21/2020 FINDINGS: Cardiomediastinal silhouette and pulmonary vasculature are within normal limits. Posttreatment changes again seen in the LEFT upper medial lung. Lungs otherwise clear. RIGHT IJ chest port terminates in the region of the superior vena cava. IMPRESSION: No acute cardiopulmonary process. Electronically Signed   By: Aliene Lloyd M.D.   On: 10/22/2024 14:10    Assessment and plan- Patient  is a 73 y.o. female with  history of extensive stage small cell lung cancer admitted for acute encephalopathy  Acute encephalopathy: Although it could be secondary to her UTI I suspect there is a more chronic component to this due to her prior whole brain radiation x 2 as well as recent tarlatamab therapy for small cell lung cancer.  I did discuss her overall clinical condition with her daughter today.  Her daughter would like to see which way she goes in the next few days and if she does not have any improvement despite trial of steroids and antibiotics she is willing to consider going home with hospice.  Extensive stage small cell lung cancer: Although patient has responded well to her small cell lung cancer treatment which was started in October 2025 at [facility], unfortunately her performance status has declined despite that.  Patient's daughter understands that it is unlikely that she will be able to receive future treatments if her performance status remains poor and she is agreeable to taking her home with hospice should her clinical condition not improve over the next few days.  Will continue to have goals of care conversation with her.  Appreciate palliative care input in assisting with goals of care   Visit Diagnosis 1. Altered mental status, unspecified altered mental status type   2. Hypernatremia   3. Lactic acidosis   4. Urinary tract infection without hematuria, site unspecified     Dr. Annah Skene, MD, MPH [facility] at [facility] [phone] 10/29/2024                   [1] No Known Allergies [2]  Current Facility-Administered Medications:    acetaminophen  (TYLENOL ) tablet 650 mg, 650 mg, Oral, Q6H PRN **OR** acetaminophen  (TYLENOL ) suppository 650 mg, 650 mg, Rectal, Q6H PRN, Jhonny, Sudheer B, MD   amLODipine  (NORVASC ) tablet 5 mg, 5 mg, Oral, Daily, Cox, Amy N, DO, 5 mg at 10/29/24 1013   apixaban  (ELIQUIS ) tablet 5 mg, 5 mg, Oral, BID, Sreenath, Sudheer B, MD, 5 mg at  10/29/24 1013   Chlorhexidine  Gluconate Cloth 2 % PADS 6 each, 6 each, Topical, Daily, Chatterjee, Srobona Tublu, MD, 6 each at 10/29/24 1021   dexamethasone  (DECADRON ) injection 10 mg, 10 mg, Intravenous, Q8H, Trudy Anthony HERO, MD, 10 mg at 10/29/24 1412   feeding supplement (ENSURE PLUS HIGH PROTEIN) liquid 237 mL, 237 mL, Oral, TID BM, Sreenath, Sudheer B, MD, 237 mL at 10/29/24 1412   fluticasone  (FLONASE ) 50 MCG/ACT nasal spray 2 spray, 2 spray, Each Nare, Daily PRN, Sreenath, Sudheer B, MD   insulin  aspart (novoLOG ) injection 0-5 Units, 0-5 Units, Subcutaneous, QHS, Sreenath, Sudheer B, MD   insulin  aspart (novoLOG ) injection 0-9 Units, 0-9 Units, Subcutaneous, TID WC, Sreenath, Sudheer B, MD, 1 Units at 10/26/24 1647   LORazepam  (ATIVAN ) injection 1 mg, 1 mg, Intravenous, PRN, Mansy, Jan A, MD, 1 mg at 10/28/24 0455   mirtazapine  (REMERON  SOL-TAB) disintegrating tablet 15 mg, 15 mg, Oral, QHS, Sreenath, Sudheer B, MD, 15 mg at 10/28/24 2238   multivitamin with minerals tablet 1 tablet, 1 tablet, Oral, Daily, Sreenath, Sudheer B, MD, 1 tablet at 10/29/24 1023   OLANZapine  (ZYPREXA ) tablet 5 mg, 5 mg, Oral, QHS, Sreenath, Sudheer B, MD, 5 mg at 10/28/24 2238   ondansetron  (ZOFRAN ) tablet 4 mg, 4 mg, Oral, Q6H PRN **OR** ondansetron  (ZOFRAN ) injection 4 mg, 4 mg, Intravenous, Q6H PRN, Jhonny, Sudheer B, MD   oxyCODONE  (Oxy IR/ROXICODONE ) immediate release  tablet 5 mg, 5 mg, Oral, Q4H PRN, Sreenath, Sudheer B, MD   sodium chloride  flush (NS) 0.9 % injection 10-40 mL, 10-40 mL, Intracatheter, Q12H, Dana, Srobona Tublu, MD, 10 mL at 10/29/24 1022   sodium chloride  flush (NS) 0.9 % injection 10-40 mL, 10-40 mL, Intracatheter, PRN, Dana, Srobona Tublu, MD   thiamine  (VITAMIN B1) tablet 100 mg, 100 mg, Oral, Daily, Hunt, Madison H, RPH, 100 mg at 10/29/24 1023   traZODone  (DESYREL ) tablet 50 mg, 50 mg, Oral, QHS, Sreenath, Sudheer B, MD, 50 mg at 10/28/24 2238  Facility-Administered  Medications Ordered in Other Encounters:    sodium chloride  flush (NS) 0.9 % injection 10 mL, 10 mL, Intravenous, PRN, Melanee Annah BROCKS, MD, 10 mL at 12/15/20 0850   sodium chloride  flush (NS) 0.9 % injection 10 mL, 10 mL, Intravenous, PRN, Melanee Annah BROCKS, MD, 10 mL at 03/07/23 [phone]

## 2024-10-29 NOTE — Progress Notes (Signed)
 PROGRESS NOTE    Doris Johnson  FMW:[id number] DOB: Apr 02, 1951 DOA: 10/22/2024 PCP: Epifanio Alm [name], MD   Assessment & Plan:   Principal Problem:   Altered mental status Active Problems:   Protein-calorie malnutrition, severe   Hypophosphatemia   Pressure injury of skin   Hypercholesteremia   Metastasis to brain (HCC)   Sepsis secondary to UTI (HCC)   Failure to thrive in adult  Assessment and Plan: Acute encephalopathy: likely secondary to UTI, hypernatremia and brain mets from lung cancer. Mental status is unchanged from day prior. Re-orient prn, Talked w/ pt's oncologist at [facility] on 10/28/24 who recommended started IV decadron  10mg  q8hr x 2 days in hopes to improve poor mental status possibly secondary to neurotoxicity from immunotherapy pt has been receiving for lung cancer   Hypernatremia: resolved   Hypophosphatemia: will re-check in AM    Severe protein calorie malnutrition: encourage po intake   Pressure injury of skin: continue w/ wound care   Bicytopenia: possibly secondary to recent chemo.WBC is WNL today. No need for a transfusion currently    Failure to thrive in adult: fam does not want hospice at this time. Continue w/ supportive care    Sepsis: see Dr. Stacy note on how pt met sepsis criteria. Secondary to UTI. Completed abx course. Resolved   Lung cancer: w/ mets to the brain. Talked w/ pt's oncologist at [facility] on 10/28/24 who requested to treat pt on IV decadron  10mg  q8h x 2 days in hopes to improve poor mental status that might be secondary to neurotoxicity from immunotherapy pt was receiving for lung cancer      DVT prophylaxis: eliquis  Code Status: full  Family Communication: discussed pt's care w/ pt's daughter and answered her questions  Disposition Plan: likely d/c back home w/ HH  Level of care: Telemetry  Status is: Inpatient Remains inpatient appropriate because: severity of illness    Consultants:  Palliative care  Procedures:    Antimicrobials:   Subjective: Pt is still lethargic   Objective: Vitals:   10/29/24 0155 10/29/24 0415 10/29/24 0430 10/29/24 0728  BP:   113/74 110/71  Pulse:   68 (!) 52  Resp:   16 18  Temp:  99.8 F (37.7 C) 98.4 F (36.9 C) (!) 96.5 F (35.8 C)  TempSrc:  Rectal Axillary   SpO2:   100% 100%  Weight: 64.1 kg     Height:        Intake/Output Summary (Last 24 hours) at 10/29/2024 1003 Last data filed at 10/29/2024 0900 Gross per 24 hour  Intake 0 ml  Output 1500 ml  Net -1500 ml   Filed Weights   10/27/24 0402 10/28/24 0442 10/29/24 0155  Weight: 62.3 kg 64.3 kg 64.1 kg    Examination:  General exam: appears lethargic. Frail appearing  Respiratory system: decreased breath sounds b/l Cardiovascular system: S1 & S2+. No rubs or clicks  Gastrointestinal system: Abd is soft, NT, ND & hypoactive bowel sounds Central nervous system: lethargic  Psychiatry: judgement and insight appears poor     Data Reviewed: I have personally reviewed following labs and imaging studies  CBC: Recent Labs  Lab 10/22/24 1244 10/23/24 0338 10/25/24 1147 10/27/24 0500 10/28/24 0431  WBC 11.2* 10.3 3.1* 3.8* 3.7*  NEUTROABS 10.7*  --   --   --   --   HGB 13.0 11.2* 10.6* 9.5* 10.1*  HCT 38.6 33.6* 31.2* 28.8* 29.4*  MCV 90.2 90.8 89.1 89.4 87.8  PLT 165 137*  91* 101* 103*   Basic Metabolic Panel: Recent Labs  Lab 10/24/24 0458 10/25/24 0518 10/26/24 0345 10/27/24 0500 10/27/24 1724 10/28/24 0431  NA 147* 149* 149* 147* 127* 143  K 3.3* 4.2 3.8 3.4*  --  3.2*  CL 115* 120* 117* 115*  --  107  CO2 17* 22 23 24   --  23  GLUCOSE 91 117* 106* 100*  --  119*  BUN 17 14 12 14   --  11  CREATININE 0.45 0.52 0.44 0.40*  --  0.39*  CALCIUM 9.5 9.5 9.5 9.6  --  9.3  MG 2.3 2.0 1.9 1.9  --  1.9  PHOS 1.4* 1.3* 1.5* 2.4*  --  2.4*   GFR: Estimated Creatinine Clearance: 55.6 mL/min (A) (by C-G formula based on SCr of 0.39 mg/dL (L)). Liver Function Tests: Recent Labs   Lab 10/22/24 1244  AST 18  ALT 7  ALKPHOS 92  BILITOT 0.9  PROT 7.7  ALBUMIN 3.6   No results for input(s): LIPASE, AMYLASE in the last 168 hours. No results for input(s): AMMONIA in the last 168 hours. Coagulation Profile: Recent Labs  Lab 10/22/24 1244  INR 1.5*   Cardiac Enzymes: No results for input(s): CKTOTAL, CKMB, CKMBINDEX, TROPONINI in the last 168 hours. BNP (last 3 results) No results for input(s): PROBNP in the last 8760 hours. HbA1C: No results for input(s): HGBA1C in the last 72 hours. CBG: Recent Labs  Lab 10/27/24 2227 10/28/24 0754 10/28/24 1217 10/28/24 2133 10/29/24 0754  GLUCAP 147* 111* 84 175* 111*   Lipid Profile: No results for input(s): CHOL, HDL, LDLCALC, TRIG, CHOLHDL, LDLDIRECT in the last 72 hours. Thyroid  Function Tests: No results for input(s): TSH, T4TOTAL, FREET4, T3FREE, THYROIDAB in the last 72 hours. Anemia Panel: No results for input(s): VITAMINB12, FOLATE, FERRITIN, TIBC, IRON, RETICCTPCT in the last 72 hours. Sepsis Labs: Recent Labs  Lab 10/22/24 1244 10/22/24 1513  LATICACIDVEN 3.1* 2.4*    Recent Results (from the past 240 hours)  Culture, blood (Routine x 2)     Status: Abnormal   Collection Time: 10/22/24 12:44 PM   Specimen: BLOOD  Result Value Ref Range Status   Specimen Description   Final    BLOOD BLOOD LEFT HAND Performed at [facility], [address] [place], [place] [address]    Special Requests   Final    BOTTLES DRAWN AEROBIC AND ANAEROBIC Blood Culture results may not be optimal due to an inadequate volume of blood received in culture bottles Performed at [facility], [address] [place], [place] [address]    Culture  Setup Time   Final    GRAM NEGATIVE RODS IN BOTH AEROBIC AND ANAEROBIC BOTTLES CRITICAL VALUE NOTED.  VALUE IS CONSISTENT WITH PREVIOUSLY REPORTED AND CALLED VALUE. Performed at [facility],  [address] [place], [place] [address]    Culture (A)  Final    ESCHERICHIA COLI SUSCEPTIBILITIES PERFORMED ON PREVIOUS CULTURE WITHIN THE LAST 5 DAYS. Performed at [facility], [address], [place], [place] [address]    Report Status 10/25/2024 FINAL  Final  Culture, blood (Routine x 2)     Status: Abnormal   Collection Time: 10/22/24 12:44 PM   Specimen: Left Antecubital; Blood  Result Value Ref Range Status   Specimen Description   Final    LEFT ANTECUBITAL Performed at [facility], [address], [place], [place] [address]    Special Requests   Final  BOTTLES DRAWN AEROBIC AND ANAEROBIC Blood Culture results may not be optimal due to an inadequate volume of blood received in culture bottles Performed at [facility] [address] [place], [place] [address]    Culture  Setup Time   Final    GRAM NEGATIVE RODS IN BOTH AEROBIC AND ANAEROBIC BOTTLES CRITICAL RESULT CALLED TO, READ BACK BY AND VERIFIED WITH: NATHAN BELUE PHARMD @0103  10/23/24 ASW Performed at [facility], [address] [place], [place] [address]    Culture ESCHERICHIA COLI (A)  Final   Report Status 10/25/2024 FINAL  Final   Organism ID, Bacteria ESCHERICHIA COLI  Final      Susceptibility   Escherichia coli - MIC*    AMPICILLIN <=2 SENSITIVE Sensitive     CEFAZOLIN  (NON-URINE) <=1 SENSITIVE Sensitive     CEFEPIME  <=0.12 SENSITIVE Sensitive     ERTAPENEM <=0.12 SENSITIVE Sensitive     CEFTRIAXONE  <=0.25 SENSITIVE Sensitive     CIPROFLOXACIN  <=0.06 SENSITIVE Sensitive     GENTAMICIN  <=1 SENSITIVE Sensitive     MEROPENEM <=0.25 SENSITIVE Sensitive     TRIMETH/SULFA <=20 SENSITIVE Sensitive     AMPICILLIN/SULBACTAM <=2 SENSITIVE Sensitive     PIP/TAZO Value in next row Sensitive      <=4 SENSITIVEThis is a modified FDA-approved test that has been validated and its performance characteristics determined by the reporting laboratory.  This laboratory is certified  under the Clinical Laboratory Improvement Amendments CLIA as qualified to perform high complexity clinical laboratory testing.    * ESCHERICHIA COLI  Blood Culture ID Panel (Reflexed)     Status: Abnormal   Collection Time: 10/22/24 12:44 PM  Result Value Ref Range Status   Enterococcus faecalis NOT DETECTED NOT DETECTED Final   Enterococcus Faecium NOT DETECTED NOT DETECTED Final   Listeria monocytogenes NOT DETECTED NOT DETECTED Final   Staphylococcus species NOT DETECTED NOT DETECTED Final   Staphylococcus aureus (BCID) NOT DETECTED NOT DETECTED Final   Staphylococcus epidermidis NOT DETECTED NOT DETECTED Final   Staphylococcus lugdunensis NOT DETECTED NOT DETECTED Final   Streptococcus species NOT DETECTED NOT DETECTED Final   Streptococcus agalactiae NOT DETECTED NOT DETECTED Final   Streptococcus pneumoniae NOT DETECTED NOT DETECTED Final   Streptococcus pyogenes NOT DETECTED NOT DETECTED Final   A.calcoaceticus-baumannii NOT DETECTED NOT DETECTED Final   Bacteroides fragilis NOT DETECTED NOT DETECTED Final   Enterobacterales DETECTED (A) NOT DETECTED Final    Comment: Enterobacterales represent a large order of gram negative bacteria, not a single organism. CRITICAL RESULT CALLED TO, READ BACK BY AND VERIFIED WITH: NATHAN BELUE PHARMD @0103  10/23/24 ASW    Enterobacter cloacae complex NOT DETECTED NOT DETECTED Final   Escherichia coli DETECTED (A) NOT DETECTED Final    Comment: CRITICAL RESULT CALLED TO, READ BACK BY AND VERIFIED WITH: NATHAN BELUE PHARMD @0103  10/23/24 ASW    Klebsiella aerogenes NOT DETECTED NOT DETECTED Final   Klebsiella oxytoca NOT DETECTED NOT DETECTED Final   Klebsiella pneumoniae NOT DETECTED NOT DETECTED Final   Proteus species NOT DETECTED NOT DETECTED Final   Salmonella species NOT DETECTED NOT DETECTED Final   Serratia marcescens NOT DETECTED NOT DETECTED Final   Haemophilus influenzae NOT DETECTED NOT DETECTED Final   Neisseria meningitidis NOT  DETECTED NOT DETECTED Final   Pseudomonas aeruginosa NOT DETECTED NOT DETECTED Final   Stenotrophomonas maltophilia NOT DETECTED NOT DETECTED Final   Candida albicans NOT DETECTED NOT DETECTED Final   Candida auris NOT DETECTED NOT DETECTED Final  Candida glabrata NOT DETECTED NOT DETECTED Final   Candida krusei NOT DETECTED NOT DETECTED Final   Candida parapsilosis NOT DETECTED NOT DETECTED Final   Candida tropicalis NOT DETECTED NOT DETECTED Final   Cryptococcus neoformans/gattii NOT DETECTED NOT DETECTED Final   CTX-M ESBL NOT DETECTED NOT DETECTED Final   Carbapenem resistance IMP NOT DETECTED NOT DETECTED Final   Carbapenem resistance KPC NOT DETECTED NOT DETECTED Final   Carbapenem resistance NDM NOT DETECTED NOT DETECTED Final   Carbapenem resist OXA 48 LIKE NOT DETECTED NOT DETECTED Final   Carbapenem resistance VIM NOT DETECTED NOT DETECTED Final    Comment: Performed at [facility], [address] [place], [place] [address]  Urine Culture     Status: Abnormal   Collection Time: 10/22/24  1:17 PM   Specimen: Urine, Random  Result Value Ref Range Status   Specimen Description   Final    URINE, RANDOM Performed at [facility], [address] [place], [place] [address]    Special Requests   Final    NONE Reflexed from [id number] Performed at [facility], [address] [place], [place] [address]    Culture >=100,000 COLONIES/mL ESCHERICHIA COLI (A)  Final   Report Status 10/24/2024 FINAL  Final   Organism ID, Bacteria ESCHERICHIA COLI (A)  Final      Susceptibility   Escherichia coli - MIC*    AMPICILLIN <=2 SENSITIVE Sensitive     CEFAZOLIN  (URINE) Value in next row Sensitive      <=1 SENSITIVEThis is a modified FDA-approved test that has been validated and its performance characteristics determined by the reporting laboratory.  This laboratory is certified under the Clinical Laboratory Improvement Amendments CLIA as qualified to perform  high complexity clinical laboratory testing.    CEFEPIME  Value in next row Sensitive      <=1 SENSITIVEThis is a modified FDA-approved test that has been validated and its performance characteristics determined by the reporting laboratory.  This laboratory is certified under the Clinical Laboratory Improvement Amendments CLIA as qualified to perform high complexity clinical laboratory testing.    ERTAPENEM Value in next row Sensitive      <=1 SENSITIVEThis is a modified FDA-approved test that has been validated and its performance characteristics determined by the reporting laboratory.  This laboratory is certified under the Clinical Laboratory Improvement Amendments CLIA as qualified to perform high complexity clinical laboratory testing.    CEFTRIAXONE  Value in next row Sensitive      <=1 SENSITIVEThis is a modified FDA-approved test that has been validated and its performance characteristics determined by the reporting laboratory.  This laboratory is certified under the Clinical Laboratory Improvement Amendments CLIA as qualified to perform high complexity clinical laboratory testing.    CIPROFLOXACIN  Value in next row Sensitive      <=1 SENSITIVEThis is a modified FDA-approved test that has been validated and its performance characteristics determined by the reporting laboratory.  This laboratory is certified under the Clinical Laboratory Improvement Amendments CLIA as qualified to perform high complexity clinical laboratory testing.    GENTAMICIN  Value in next row Sensitive      <=1 SENSITIVEThis is a modified FDA-approved test that has been validated and its performance characteristics determined by the reporting laboratory.  This laboratory is certified under the Clinical Laboratory Improvement Amendments CLIA as qualified to perform high complexity clinical laboratory testing.    NITROFURANTOIN Value in next row Sensitive      <=1 SENSITIVEThis is a modified FDA-approved  test that has been  validated and its performance characteristics determined by the reporting laboratory.  This laboratory is certified under the Clinical Laboratory Improvement Amendments CLIA as qualified to perform high complexity clinical laboratory testing.    TRIMETH/SULFA Value in next row Sensitive      <=1 SENSITIVEThis is a modified FDA-approved test that has been validated and its performance characteristics determined by the reporting laboratory.  This laboratory is certified under the Clinical Laboratory Improvement Amendments CLIA as qualified to perform high complexity clinical laboratory testing.    AMPICILLIN/SULBACTAM Value in next row Sensitive      <=1 SENSITIVEThis is a modified FDA-approved test that has been validated and its performance characteristics determined by the reporting laboratory.  This laboratory is certified under the Clinical Laboratory Improvement Amendments CLIA as qualified to perform high complexity clinical laboratory testing.    PIP/TAZO Value in next row Sensitive      <=4 SENSITIVEThis is a modified FDA-approved test that has been validated and its performance characteristics determined by the reporting laboratory.  This laboratory is certified under the Clinical Laboratory Improvement Amendments CLIA as qualified to perform high complexity clinical laboratory testing.    MEROPENEM Value in next row Sensitive      <=4 SENSITIVEThis is a modified FDA-approved test that has been validated and its performance characteristics determined by the reporting laboratory.  This laboratory is certified under the Clinical Laboratory Improvement Amendments CLIA as qualified to perform high complexity clinical laboratory testing.    * >=100,000 COLONIES/mL ESCHERICHIA COLI         Radiology Studies: No results found.       Scheduled Meds:  amLODipine   5 mg Oral Daily   apixaban   5 mg Oral BID   Chlorhexidine  Gluconate Cloth  6 each Topical Daily   dexamethasone  (DECADRON )  injection  10 mg Intravenous Q8H   feeding supplement  237 mL Oral TID BM   insulin  aspart  0-5 Units Subcutaneous QHS   insulin  aspart  0-9 Units Subcutaneous TID WC   mirtazapine   15 mg Oral QHS   multivitamin with minerals  1 tablet Oral Daily   OLANZapine   5 mg Oral QHS   sodium chloride  flush  10-40 mL Intracatheter Q12H   thiamine   100 mg Oral Daily   traZODone   50 mg Oral QHS   Continuous Infusions:     LOS: 6 days       Anthony CHRISTELLA Pouch, MD [facility] Pager [phone]  If 7PM-7AM, please contact night-coverage [email or web address] 10/29/2024, 10:03 AM

## 2024-10-30 DIAGNOSIS — R4182 Altered mental status, unspecified: Secondary | ICD-10-CM | POA: Diagnosis not present

## 2024-10-30 DIAGNOSIS — E872 Acidosis, unspecified: Secondary | ICD-10-CM | POA: Diagnosis not present

## 2024-10-30 DIAGNOSIS — G9340 Encephalopathy, unspecified: Secondary | ICD-10-CM | POA: Diagnosis not present

## 2024-10-30 DIAGNOSIS — N390 Urinary tract infection, site not specified: Secondary | ICD-10-CM | POA: Diagnosis not present

## 2024-10-30 DIAGNOSIS — Z515 Encounter for palliative care: Secondary | ICD-10-CM | POA: Diagnosis not present

## 2024-10-30 LAB — CBC
HCT: 28.4 % — ABNORMAL LOW (ref 36.0–46.0)
Hemoglobin: 9.6 g/dL — ABNORMAL LOW (ref 12.0–15.0)
MCH: 30.3 pg (ref 26.0–34.0)
MCHC: 33.8 g/dL (ref 30.0–36.0)
MCV: 89.6 fL (ref 80.0–100.0)
Platelets: 133 K/uL — ABNORMAL LOW (ref 150–400)
RBC: 3.17 MIL/uL — ABNORMAL LOW (ref 3.87–5.11)
RDW: 16.4 % — ABNORMAL HIGH (ref 11.5–15.5)
WBC: 6.4 K/uL (ref 4.0–10.5)
nRBC: 0.3 % — ABNORMAL HIGH (ref 0.0–0.2)

## 2024-10-30 LAB — GLUCOSE, CAPILLARY
Glucose-Capillary: 116 mg/dL — ABNORMAL HIGH (ref 70–99)
Glucose-Capillary: 168 mg/dL — ABNORMAL HIGH (ref 70–99)
Glucose-Capillary: 174 mg/dL — ABNORMAL HIGH (ref 70–99)
Glucose-Capillary: 96 mg/dL (ref 70–99)

## 2024-10-30 LAB — MAGNESIUM: Magnesium: 2.1 mg/dL (ref 1.7–2.4)

## 2024-10-30 LAB — PHOSPHORUS: Phosphorus: 2.1 mg/dL — ABNORMAL LOW (ref 2.5–4.6)

## 2024-10-30 MED ORDER — DEXAMETHASONE 4 MG PO TABS
4.0000 mg | ORAL_TABLET | Freq: Two times a day (BID) | ORAL | Status: DC
  Administered 2024-10-30 – 2024-11-04 (×9): 4 mg via ORAL
  Filled 2024-10-30 (×12): qty 1

## 2024-10-30 MED ORDER — POTASSIUM PHOSPHATES 15 MMOLE/5ML IV SOLN
30.0000 mmol | Freq: Once | INTRAVENOUS | Status: AC
  Administered 2024-10-30: 12:00:00 30 mmol via INTRAVENOUS
  Filled 2024-10-30: qty 10

## 2024-10-30 NOTE — Progress Notes (Signed)
 Palliative Care Progress Note, Assessment & Plan   Patient Name: Doris Johnson       Date: 10/30/2024 DOB: 1951-08-25  Age: 73 y.o. MRN#: [id number] Attending Physician: Trudy Anthony HERO, MD Primary Care Physician: Epifanio Alm [name], MD Admit Date: 10/22/2024  Subjective: Patient is lying in bed asleep.  She awakens to residents but does not.  She is able to say hello when asked other questions she repeatedly answered I do not know.  No family or friends visit.  HPI: 73 y.o. female  with past medical history of lung cancer with diffuse metastasis, non-insulin -dependent diabetes mellitus, hypertension, hypothyroidism admitted on 10/22/2024 with AMS and lethargy.   As per chart review, pt presented to ED for evaluation of decreased urine output, lethargy, and altered mentation over the past several days PTA.    Workup revealed blood cultures positive for E. Coli bacteremia. Pt is being treated for E.coli bacteremia, decreased urine output and oral intake, acute metabolic encephalopathy, hypernatremia, and (suspected) severe protein calorie malnutrition.   Summary of counseling/coordination of care: Extensive chart review completed prior to meeting patient including labs, vital signs, imaging, progress notes, orders, and available advanced directive documents from current and previous encounters.   After reviewing the patient's chart and assessing the patient at bedside, I spoke with patient in regards to symptom management and goals of care.  I attempted to gauge patient's understanding of her current situation.  She was unable to say anything other than I don't know. Patient remains unable to participate in goals of care discussions or medical decision making independently at this time.    Symptoms assessed. She displays no signs of distress such as brow burrowing, grimacing, withdrawing form physical stimuli, guarding, fidgeting, or moaning. No adjustment to [facility] needed at this time.   I discussed that her current medial status is being compromised from her lack of eating and drinking. She shares yes. Discussed importance of PO intake for recovery and strength. She again said yes. When asked if she would like to have something to eat and drink, patient said no and rolled over in bed, appearing to return to sleep.   I remain concerned that patient is not eating or drinking. As per documented intake, patient has had a total of approximately 500cc of fluid with no food intake over the last 5 days.   As per oncology, plan remains to continue to monitor the patient for the next 24H to see if she shows sings of improvement. If not, home with hospice will be the next recommendation.   PMT will continue watchful waiting and to support daughter/patient with GOC discussions.   Physical Exam Vitals reviewed.  Constitutional:      Comments: Thin  HENT:     Head:     Comments: Temporal wasting    Mouth/Throat:     Mouth: Mucous membranes are dry.  Pulmonary:     Effort: Pulmonary effort is normal.  Abdominal:     Palpations: Abdomen is soft.  Skin:    General: Skin is warm and dry.  Neurological:     Mental Status: She is alert.     Comments: Oriented to self  Psychiatric:  Behavior: Behavior normal.            25 minute visit includes: Detailed review of medical records (labs, imaging, vital signs), medically appropriate exam (mental status, respiratory, cardiac, skin), discussed with treatment team, counseling and educating patient, family and staff, documenting clinical information, medication management and coordination of care.  Lamarr L. Arvid, DNP, FNP-BC Palliative Medicine Team

## 2024-10-30 NOTE — Progress Notes (Signed)
 PROGRESS NOTE    GESSELLE FITZSIMONS  FMW:[id number] DOB: Oct 28, 1951 DOA: 10/22/2024 PCP: Epifanio Alm [name], MD   Assessment & Plan:   Principal Problem:   Altered mental status Active Problems:   Protein-calorie malnutrition, severe   Hypophosphatemia   Pressure injury of skin   Hypercholesteremia   Metastasis to brain (HCC)   Sepsis secondary to UTI (HCC)   Failure to thrive in adult   Small cell lung cancer, left (HCC)   Acute encephalopathy  Assessment and Plan: Acute encephalopathy: likely secondary to UTI, hypernatremia and brain mets from lung cancer. Mental status has not improved much Re-orient prn, Talked w/ pt's oncologist at [facility] on 10/28/24 who recommended started IV decadron  10mg  q8hr x 2 days in hopes to improve poor mental status possibly secondary to neurotoxicity from immunotherapy pt has been receiving for lung cancer but minimal to improvement so far. Completed 2 days of high dose IV decadron    Hypernatremia: resolved   Hypophosphatemia: will re-check in AM    Severe protein calorie malnutrition: encourage po intake    Pressure injury of skin: continue w/ wound care   Bicytopenia: possibly secondary to recent chemo. WBC is WNL currently. No need for a transfusion today    Failure to thrive in adult: fam does not want hospice at this time. Continue w/ supportive care    Sepsis: see Dr. Stacy note on how pt met sepsis criteria. Secondary to UTI. Completed abx course. Resolved   Lung cancer: w/ mets to the brain. Overall poor prognosis. Onco following and recs apprec      DVT prophylaxis: eliquis  Code Status: full  Family Communication: discussed pt's care w/ pt's daughter and answered her questions  Disposition Plan: likely d/c back home w/ HH  Level of care: Telemetry  Status is: Inpatient Remains inpatient appropriate because: severity of illness    Consultants:  Palliative care  Procedures:   Antimicrobials:   Subjective: Pt is still  lethargic  Objective: Vitals:   10/29/24 2133 10/30/24 0035 10/30/24 0333 10/30/24 0809  BP:   135/80 109/68  Pulse:   (!) 55 (!) 45  Resp:   18 15  Temp: (!) 95.6 F (35.3 C) 98.6 F (37 C) 97.7 F (36.5 C) 97.9 F (36.6 C)  TempSrc: Rectal Rectal  Oral  SpO2:   100% 100%  Weight:      Height:        Intake/Output Summary (Last 24 hours) at 10/30/2024 1010 Last data filed at 10/30/2024 0945 Gross per 24 hour  Intake 140 ml  Output 450 ml  Net -310 ml   Filed Weights   10/27/24 0402 10/28/24 0442 10/29/24 0155  Weight: 62.3 kg 64.3 kg 64.1 kg    Examination:  General exam: appears lethargic. Frail appearing  Respiratory system: diminished breath sounds b/l  Cardiovascular system: S1/S2+. No rubs or clicks  Gastrointestinal system: Abd is soft, NT, ND & hypoactive bowel sounds  Central nervous system:lethargic  Psychiatry: judgement and insight appears poor.     Data Reviewed: I have personally reviewed following labs and imaging studies  CBC: Recent Labs  Lab 10/25/24 1147 10/27/24 0500 10/28/24 0431 10/29/24 1021 10/30/24 0537  WBC 3.1* 3.8* 3.7* 5.6 6.4  HGB 10.6* 9.5* 10.1* 10.3* 9.6*  HCT 31.2* 28.8* 29.4* 30.4* 28.4*  MCV 89.1 89.4 87.8 87.6 89.6  PLT 91* 101* 103* 142* 133*   Basic Metabolic Panel: Recent Labs  Lab 10/25/24 0518 10/26/24 0345 10/27/24 0500 10/27/24 1724 10/28/24  0431 10/29/24 1021 10/30/24 0537  NA 149* 149* 147* 127* 143 140  --   K 4.2 3.8 3.4*  --  3.2* 4.7  --   CL 120* 117* 115*  --  107 108  --   CO2 22 23 24   --  23 24  --   GLUCOSE 117* 106* 100*  --  119* 129*  --   BUN 14 12 14   --  11 12  --   CREATININE 0.52 0.44 0.40*  --  0.39* 0.61  --   CALCIUM 9.5 9.5 9.6  --  9.3 9.5  --   MG 2.0 1.9 1.9  --  1.9  --  2.1  PHOS 1.3* 1.5* 2.4*  --  2.4*  --  2.1*   GFR: Estimated Creatinine Clearance: 55.6 mL/min (by C-G formula based on SCr of 0.61 mg/dL). Liver Function Tests: No results for input(s): AST,  ALT, ALKPHOS, BILITOT, PROT, ALBUMIN in the last 168 hours.  No results for input(s): LIPASE, AMYLASE in the last 168 hours. No results for input(s): AMMONIA in the last 168 hours. Coagulation Profile: No results for input(s): INR, PROTIME in the last 168 hours.  Cardiac Enzymes: No results for input(s): CKTOTAL, CKMB, CKMBINDEX, TROPONINI in the last 168 hours. BNP (last 3 results) No results for input(s): PROBNP in the last 8760 hours. HbA1C: No results for input(s): HGBA1C in the last 72 hours. CBG: Recent Labs  Lab 10/29/24 0754 10/29/24 1305 10/29/24 1623 10/29/24 2141 10/30/24 0810  GLUCAP 111* 113* 112* 134* 116*   Lipid Profile: No results for input(s): CHOL, HDL, LDLCALC, TRIG, CHOLHDL, LDLDIRECT in the last 72 hours. Thyroid  Function Tests: No results for input(s): TSH, T4TOTAL, FREET4, T3FREE, THYROIDAB in the last 72 hours. Anemia Panel: No results for input(s): VITAMINB12, FOLATE, FERRITIN, TIBC, IRON, RETICCTPCT in the last 72 hours. Sepsis Labs: No results for input(s): PROCALCITON, LATICACIDVEN in the last 168 hours.   Recent Results (from the past 240 hours)  Culture, blood (Routine x 2)     Status: Abnormal   Collection Time: 10/22/24 12:44 PM   Specimen: BLOOD  Result Value Ref Range Status   Specimen Description   Final    BLOOD BLOOD LEFT HAND Performed at [facility], [address], [place], [place] [address]    Special Requests   Final    BOTTLES DRAWN AEROBIC AND ANAEROBIC Blood Culture results may not be optimal due to an inadequate volume of blood received in culture bottles Performed at [facility], [address], [place], [place] [address]    Culture  Setup Time   Final    GRAM NEGATIVE RODS IN BOTH AEROBIC AND ANAEROBIC BOTTLES CRITICAL VALUE NOTED.  VALUE IS CONSISTENT WITH PREVIOUSLY REPORTED AND CALLED VALUE. Performed at [facility], [address] [place], [place] [address]    Culture (A)  Final    ESCHERICHIA COLI SUSCEPTIBILITIES PERFORMED ON PREVIOUS CULTURE WITHIN THE LAST 5 DAYS. Performed at [facility], [address] [place], [place] [address]    Report Status 10/25/2024 FINAL  Final  Culture, blood (Routine x 2)     Status: Abnormal   Collection Time: 10/22/24 12:44 PM   Specimen: Left Antecubital; Blood  Result Value Ref Range Status   Specimen Description   Final    LEFT ANTECUBITAL Performed at [facility], [address] [place], [place] [address]    Special Requests   Final  BOTTLES DRAWN AEROBIC AND ANAEROBIC Blood Culture results may not be optimal due to an inadequate volume of blood received in culture bottles Performed at [facility], [address] [place], [place] [address]    Culture  Setup Time   Final    GRAM NEGATIVE RODS IN BOTH AEROBIC AND ANAEROBIC BOTTLES CRITICAL RESULT CALLED TO, READ BACK BY AND VERIFIED WITH: NATHAN BELUE PHARMD @0103  10/23/24 ASW Performed at [facility], [address] [place], [place] [address]    Culture ESCHERICHIA COLI (A)  Final   Report Status 10/25/2024 FINAL  Final   Organism ID, Bacteria ESCHERICHIA COLI  Final      Susceptibility   Escherichia coli - MIC*    AMPICILLIN <=2 SENSITIVE Sensitive     CEFAZOLIN  (NON-URINE) <=1 SENSITIVE Sensitive     CEFEPIME  <=0.12 SENSITIVE Sensitive     ERTAPENEM <=0.12 SENSITIVE Sensitive     CEFTRIAXONE  <=0.25 SENSITIVE Sensitive     CIPROFLOXACIN  <=0.06 SENSITIVE Sensitive     GENTAMICIN  <=1 SENSITIVE Sensitive     MEROPENEM <=0.25 SENSITIVE Sensitive     TRIMETH/SULFA <=20 SENSITIVE Sensitive     AMPICILLIN/SULBACTAM <=2 SENSITIVE Sensitive     PIP/TAZO Value in next row Sensitive      <=4 SENSITIVEThis is a modified FDA-approved test that has been validated and its performance characteristics determined by the reporting laboratory.  This laboratory is  certified under the Clinical Laboratory Improvement Amendments CLIA as qualified to perform high complexity clinical laboratory testing.    * ESCHERICHIA COLI  Blood Culture ID Panel (Reflexed)     Status: Abnormal   Collection Time: 10/22/24 12:44 PM  Result Value Ref Range Status   Enterococcus faecalis NOT DETECTED NOT DETECTED Final   Enterococcus Faecium NOT DETECTED NOT DETECTED Final   Listeria monocytogenes NOT DETECTED NOT DETECTED Final   Staphylococcus species NOT DETECTED NOT DETECTED Final   Staphylococcus aureus (BCID) NOT DETECTED NOT DETECTED Final   Staphylococcus epidermidis NOT DETECTED NOT DETECTED Final   Staphylococcus lugdunensis NOT DETECTED NOT DETECTED Final   Streptococcus species NOT DETECTED NOT DETECTED Final   Streptococcus agalactiae NOT DETECTED NOT DETECTED Final   Streptococcus pneumoniae NOT DETECTED NOT DETECTED Final   Streptococcus pyogenes NOT DETECTED NOT DETECTED Final   A.calcoaceticus-baumannii NOT DETECTED NOT DETECTED Final   Bacteroides fragilis NOT DETECTED NOT DETECTED Final   Enterobacterales DETECTED (A) NOT DETECTED Final    Comment: Enterobacterales represent a large order of gram negative bacteria, not a single organism. CRITICAL RESULT CALLED TO, READ BACK BY AND VERIFIED WITH: NATHAN BELUE PHARMD @0103  10/23/24 ASW    Enterobacter cloacae complex NOT DETECTED NOT DETECTED Final   Escherichia coli DETECTED (A) NOT DETECTED Final    Comment: CRITICAL RESULT CALLED TO, READ BACK BY AND VERIFIED WITH: NATHAN BELUE PHARMD @0103  10/23/24 ASW    Klebsiella aerogenes NOT DETECTED NOT DETECTED Final   Klebsiella oxytoca NOT DETECTED NOT DETECTED Final   Klebsiella pneumoniae NOT DETECTED NOT DETECTED Final   Proteus species NOT DETECTED NOT DETECTED Final   Salmonella species NOT DETECTED NOT DETECTED Final   Serratia marcescens NOT DETECTED NOT DETECTED Final   Haemophilus influenzae NOT DETECTED NOT DETECTED Final   Neisseria  meningitidis NOT DETECTED NOT DETECTED Final   Pseudomonas aeruginosa NOT DETECTED NOT DETECTED Final   Stenotrophomonas maltophilia NOT DETECTED NOT DETECTED Final   Candida albicans NOT DETECTED NOT DETECTED Final   Candida auris NOT DETECTED NOT DETECTED Final  Candida glabrata NOT DETECTED NOT DETECTED Final   Candida krusei NOT DETECTED NOT DETECTED Final   Candida parapsilosis NOT DETECTED NOT DETECTED Final   Candida tropicalis NOT DETECTED NOT DETECTED Final   Cryptococcus neoformans/gattii NOT DETECTED NOT DETECTED Final   CTX-M ESBL NOT DETECTED NOT DETECTED Final   Carbapenem resistance IMP NOT DETECTED NOT DETECTED Final   Carbapenem resistance KPC NOT DETECTED NOT DETECTED Final   Carbapenem resistance NDM NOT DETECTED NOT DETECTED Final   Carbapenem resist OXA 48 LIKE NOT DETECTED NOT DETECTED Final   Carbapenem resistance VIM NOT DETECTED NOT DETECTED Final    Comment: Performed at [facility], [address], [place], [place] [address]  Urine Culture     Status: Abnormal   Collection Time: 10/22/24  1:17 PM   Specimen: Urine, Random  Result Value Ref Range Status   Specimen Description   Final    URINE, RANDOM Performed at [facility], [address] [place], [place] [address]    Special Requests   Final    NONE Reflexed from [id number] Performed at [facility], [address] [place], [place] [address]    Culture >=100,000 COLONIES/mL ESCHERICHIA COLI (A)  Final   Report Status 10/24/2024 FINAL  Final   Organism ID, Bacteria ESCHERICHIA COLI (A)  Final      Susceptibility   Escherichia coli - MIC*    AMPICILLIN <=2 SENSITIVE Sensitive     CEFAZOLIN  (URINE) Value in next row Sensitive      <=1 SENSITIVEThis is a modified FDA-approved test that has been validated and its performance characteristics determined by the reporting laboratory.  This laboratory is certified under the Clinical Laboratory Improvement Amendments CLIA as  qualified to perform high complexity clinical laboratory testing.    CEFEPIME  Value in next row Sensitive      <=1 SENSITIVEThis is a modified FDA-approved test that has been validated and its performance characteristics determined by the reporting laboratory.  This laboratory is certified under the Clinical Laboratory Improvement Amendments CLIA as qualified to perform high complexity clinical laboratory testing.    ERTAPENEM Value in next row Sensitive      <=1 SENSITIVEThis is a modified FDA-approved test that has been validated and its performance characteristics determined by the reporting laboratory.  This laboratory is certified under the Clinical Laboratory Improvement Amendments CLIA as qualified to perform high complexity clinical laboratory testing.    CEFTRIAXONE  Value in next row Sensitive      <=1 SENSITIVEThis is a modified FDA-approved test that has been validated and its performance characteristics determined by the reporting laboratory.  This laboratory is certified under the Clinical Laboratory Improvement Amendments CLIA as qualified to perform high complexity clinical laboratory testing.    CIPROFLOXACIN  Value in next row Sensitive      <=1 SENSITIVEThis is a modified FDA-approved test that has been validated and its performance characteristics determined by the reporting laboratory.  This laboratory is certified under the Clinical Laboratory Improvement Amendments CLIA as qualified to perform high complexity clinical laboratory testing.    GENTAMICIN  Value in next row Sensitive      <=1 SENSITIVEThis is a modified FDA-approved test that has been validated and its performance characteristics determined by the reporting laboratory.  This laboratory is certified under the Clinical Laboratory Improvement Amendments CLIA as qualified to perform high complexity clinical laboratory testing.    NITROFURANTOIN Value in next row Sensitive      <=1 SENSITIVEThis is a modified FDA-approved  test  that has been validated and its performance characteristics determined by the reporting laboratory.  This laboratory is certified under the Clinical Laboratory Improvement Amendments CLIA as qualified to perform high complexity clinical laboratory testing.    TRIMETH/SULFA Value in next row Sensitive      <=1 SENSITIVEThis is a modified FDA-approved test that has been validated and its performance characteristics determined by the reporting laboratory.  This laboratory is certified under the Clinical Laboratory Improvement Amendments CLIA as qualified to perform high complexity clinical laboratory testing.    AMPICILLIN/SULBACTAM Value in next row Sensitive      <=1 SENSITIVEThis is a modified FDA-approved test that has been validated and its performance characteristics determined by the reporting laboratory.  This laboratory is certified under the Clinical Laboratory Improvement Amendments CLIA as qualified to perform high complexity clinical laboratory testing.    PIP/TAZO Value in next row Sensitive      <=4 SENSITIVEThis is a modified FDA-approved test that has been validated and its performance characteristics determined by the reporting laboratory.  This laboratory is certified under the Clinical Laboratory Improvement Amendments CLIA as qualified to perform high complexity clinical laboratory testing.    MEROPENEM Value in next row Sensitive      <=4 SENSITIVEThis is a modified FDA-approved test that has been validated and its performance characteristics determined by the reporting laboratory.  This laboratory is certified under the Clinical Laboratory Improvement Amendments CLIA as qualified to perform high complexity clinical laboratory testing.    * >=100,000 COLONIES/mL ESCHERICHIA COLI         Radiology Studies: No results found.       Scheduled Meds:  amLODipine   5 mg Oral Daily   apixaban   5 mg Oral BID   Chlorhexidine  Gluconate Cloth  6 each Topical Daily   feeding  supplement  237 mL Oral TID BM   insulin  aspart  0-5 Units Subcutaneous QHS   insulin  aspart  0-9 Units Subcutaneous TID WC   mirtazapine   15 mg Oral QHS   multivitamin with minerals  1 tablet Oral Daily   OLANZapine   5 mg Oral QHS   sodium chloride  flush  10-40 mL Intracatheter Q12H   thiamine   100 mg Oral Daily   traZODone   50 mg Oral QHS   Continuous Infusions:  potassium PHOSPHATE  IVPB (in mmol)        LOS: 7 days       Anthony CHRISTELLA Pouch, MD [facility] Pager [phone]  If 7PM-7AM, please contact night-coverage [email or web address] 10/30/2024, 10:10 AM

## 2024-10-30 NOTE — Progress Notes (Signed)
 OT Cancellation Note  Patient Details Name: Doris Johnson MRN: [id number] DOB: [birth year]   Cancelled Treatment:    Reason Eval/Treat Not Completed: Other (comment). Per chart, continued conversations with family re: goals of care. No family present in room, OT will continue to follow and see pt with family present for education.  [name] L. Brittanie Dosanjh, OTR/L  10/30/2024, 2:50 PM

## 2024-10-30 NOTE — Progress Notes (Signed)
 Pt has not voided this shift.  Bladder scan assessed .  Immediately upon completion of bladder scan, pt voided .  Dr Trudy made aware of same.  No new orders at this time.

## 2024-10-30 NOTE — TOC Progression Note (Signed)
 Transition of Care [facility]) - Progression Note    Patient Details  Name: Doris Johnson MRN: [id number] Date of Birth: [birth year]  [facility]) CM/SW Contact  [name], RN Phone Number: 10/30/2024, 9:31 AM  Clinical Narrative:     MD aware that will need 24 hour notice prior to dc in order to place referral for DME to be delivered to home    Barriers to Discharge: Continued Medical Work up               Expected Discharge Plan and Services       Living arrangements for the past 2 months: Apartment                                       Social Drivers of Health (SDOH) Interventions SDOH Screenings   Food Insecurity: No Food Insecurity (08/16/2024)   Received from [facility]  Housing: Low Risk  (08/16/2024)   Received from [facility]  Transportation Needs: No Transportation Needs (08/16/2024)   Received from [facility]  Utilities: Not At Risk (08/16/2024)   Received from [facility]  Depression [phone]): Low Risk (07/02/2024)  Financial Resource Strain: Low Risk  (08/16/2024)   Received from [facility]  Tobacco Use: Low Risk (10/25/2024)  Recent Concern: Tobacco Use - Medium Risk (10/07/2024)   Received from [facility]    Readmission Risk Interventions    10/25/2024    9:42 AM  Readmission Risk Prevention Plan  Transportation Screening Complete  PCP or Specialist Appt within 3-5 Days Complete  Palliative Care Screening Complete  Medication Review (RN Care Manager) Complete

## 2024-10-31 DIAGNOSIS — G9340 Encephalopathy, unspecified: Secondary | ICD-10-CM | POA: Diagnosis not present

## 2024-10-31 LAB — GLUCOSE, CAPILLARY
Glucose-Capillary: 117 mg/dL — ABNORMAL HIGH (ref 70–99)
Glucose-Capillary: 101 mg/dL — ABNORMAL HIGH (ref 70–99)
Glucose-Capillary: 99 mg/dL (ref 70–99)
Glucose-Capillary: 99 mg/dL (ref 70–99)

## 2024-10-31 LAB — BASIC METABOLIC PANEL WITH GFR
Anion gap: 7 (ref 5–15)
BUN: 23 mg/dL (ref 8–23)
CO2: 24 mmol/L (ref 22–32)
Calcium: 9 mg/dL (ref 8.9–10.3)
Chloride: 109 mmol/L (ref 98–111)
Creatinine, Ser: 0.49 mg/dL (ref 0.44–1.00)
GFR, Estimated: >60 mL/min (ref >60)
Glucose, Bld: 126 mg/dL — ABNORMAL HIGH (ref 70–99)
Potassium: 3.6 mmol/L (ref 3.5–5.1)
Sodium: 140 mmol/L (ref 135–145)

## 2024-10-31 MED ORDER — SODIUM PHOSPHATES 45 MMOLE/15ML IV SOLN
30.0000 mmol | Freq: Once | INTRAVENOUS | Status: AC
  Administered 2024-10-31: 13:00:00 30 mmol via INTRAVENOUS
  Filled 2024-10-31: qty 10

## 2024-10-31 NOTE — TOC Progression Note (Signed)
 Transition of Care [facility]) - Progression Note    Patient Details  Name: Doris Johnson MRN: [id number] Date of Birth: Apr 21, 1951  Transition of Care [facility]) CM/SW Contact  [name], RN Phone Number: 10/31/2024, 2:14 PM  Clinical Narrative:     Per MD not medically ready for DC.  Will hold ordering home DME closer to dc    Barriers to Discharge: Continued Medical Work up               Expected Discharge Plan and Services       Living arrangements for the past 2 months: Apartment                                       Social Drivers of Health (SDOH) Interventions SDOH Screenings   Food Insecurity: No Food Insecurity (08/16/2024)   Received from [facility]  Housing: Low Risk  (08/16/2024)   Received from [facility]  Transportation Needs: No Transportation Needs (08/16/2024)   Received from [facility]  Utilities: Not At Risk (08/16/2024)   Received from [facility]  Depression [phone]): Low Risk (07/02/2024)  Financial Resource Strain: Low Risk  (08/16/2024)   Received from [facility]  Tobacco Use: Low Risk (10/25/2024)  Recent Concern: Tobacco Use - Medium Risk (10/07/2024)   Received from [facility]    Readmission Risk Interventions    10/25/2024    9:42 AM  Readmission Risk Prevention Plan  Transportation Screening Complete  PCP or Specialist Appt within 3-5 Days Complete  Palliative Care Screening Complete  Medication Review (RN Care Manager) Complete

## 2024-10-31 NOTE — Progress Notes (Signed)
 Physical Therapy Treatment Patient Details Name: Doris Johnson MRN: [id number] DOB: 02/14/1951 Today's Date: 10/31/2024   History of Present Illness Pt is a 73 year old female presents to the ED for evaluation of decreased urinary output, lethargy, altered mentation admitted with sepsis, acute metabolic encephalopathy     PMH significant for lung cancer with diffuse metastasis, non-insulin -dependent diabetes mellitus, hypertension, hypothyroidism    PT Comments  Modified PT session due to pt tolerance, lethargy, and multiple comorbidities. Pt declined sitting EOB or getting OOB this date. Therefore session focused on bed mobility, repositioning up in bed and onto Right side for skin integrity, and gentle P/AAROM of B LE's to tolerance. Will continue to follow and offer support as needed. Potential d/c home with hospice.    If plan is discharge home, recommend the following: Two people to help with walking and/or transfers;A lot of help with bathing/dressing/bathroom   Can travel by private vehicle        Equipment Recommendations  Other (comment) (? Hospital bed, refer to Hospice)    Recommendations for Other Services       Precautions / Restrictions Precautions Precautions: Fall Recall of Precautions/Restrictions: Impaired Restrictions Weight Bearing Restrictions Per Provider Order: No     Mobility  Bed Mobility Overal bed mobility: Needs Assistance Bed Mobility: Rolling Rolling: Mod assist, +2 for physical assistance, Used rails         General bed mobility comments: step by step multi modal cueing    Transfers                   General transfer comment:  (Pt declined sitting EOB or OOB activity)    Ambulation/Gait               General Gait Details: unable   Stairs             Wheelchair Mobility     Tilt Bed    Modified Rankin (Stroke Patients Only)       Balance Overall balance assessment: Needs assistance Sitting-balance  support: Feet supported Sitting balance-Leahy Scale: Zero                                      Communication Communication Communication: Impaired Factors Affecting Communication: Difficulty expressing self;Reduced clarity of speech  Cognition Arousal: Alert, Lethargic Behavior During Therapy: Flat affect   PT - Cognitive impairments: No family/caregiver present to determine baseline, Orientation, Awareness, Memory, Initiation, Sequencing, Problem solving, Safety/Judgement   Orientation impairments: Time, Situation                   PT - Cognition Comments:  (Pt very soft spoken, appears disoriented with possible hallucinations) Following commands: Impaired Following commands impaired: Follows one step commands inconsistently    Cueing Cueing Techniques: Verbal cues, Gestural cues, Tactile cues, Visual cues  Exercises      General Comments General comments (skin integrity, edema, etc.):  (Purewick and IV present)      Pertinent Vitals/Pain Pain Assessment Pain Assessment: Faces Faces Pain Scale: Hurts a little bit Pain Location:  (Buttocks) Pain Descriptors / Indicators: Aching, Discomfort, Sore Pain Intervention(s): Repositioned    Home Living                          Prior Function            PT  Goals (current goals can now be found in the care plan section) Acute Rehab PT Goals Patient Stated Goal: none stated    Frequency    Min 2X/week      PT Plan      Co-evaluation              AM-PAC PT 6 Clicks Mobility   Outcome Measure  Help needed turning from your back to your side while in a flat bed without using bedrails?: A Lot Help needed moving from lying on your back to sitting on the side of a flat bed without using bedrails?: A Lot Help needed moving to and from a bed to a chair (including a wheelchair)?: Total Help needed standing up from a chair using your arms (e.g., wheelchair or bedside chair)?:  Total Help needed to walk in hospital room?: Total Help needed climbing 3-5 steps with a railing? : Total 6 Click Score: 8    End of Session   Activity Tolerance: Patient limited by fatigue;Patient limited by lethargy Patient left: in bed;with call bell/phone within reach;with bed alarm set Nurse Communication: Mobility status PT Visit Diagnosis: Other abnormalities of gait and mobility (R26.89);Muscle weakness (generalized) (M62.81)     Time: 8577-8568 PT Time Calculation (min) (ACUTE ONLY): 9 min  Charges:    $Therapeutic Activity: 8-22 mins PT General Charges $$ ACUTE PT VISIT: 1 Visit                    Darice Bohr, PTA  Darice JAYSON Bohr 10/31/2024, 2:39 PM

## 2024-10-31 NOTE — Progress Notes (Addendum)
 Palliative Care Progress Note, Assessment & Plan   Patient Name: Doris Johnson       Date: 10/31/2024 DOB: 1950/12/08  Age: 73 y.o. MRN#: [id number] Attending Physician: Trudy Anthony HERO, MD Primary Care Physician: Epifanio Alm [name], MD Admit Date: 10/22/2024  Subjective: Patient is lying in bed with TeleSitter in place.  She is awake and alert.  She acknowledges my presence.  RN also at bedside during my visit.  HPI: 73 y.o. female  with past medical history of lung cancer with diffuse metastasis, non-insulin -dependent diabetes mellitus, hypertension, hypothyroidism admitted on 10/22/2024 with AMS and lethargy.   As per chart review, pt presented to ED for evaluation of decreased urine output, lethargy, and altered mentation over the past several days PTA.    Workup revealed blood cultures positive for E. Coli bacteremia. Pt is being treated for E.coli bacteremia, decreased urine output and oral intake, acute metabolic encephalopathy, hypernatremia, and (suspected) severe protein calorie malnutrition.   Summary of counseling/coordination of care: Extensive chart review completed prior to meeting patient including labs, vital signs, imaging, progress notes, orders, and available advanced directive documents from current and previous encounters.   After reviewing the patient's chart and assessing the patient at bedside, I spoke with RN in regards to patient's p.o. intake and cognitive status today.  RN endorses patient had a few bites of breakfast and a few bites of lunch.  However, she has not had any substantial p.o. intake today.  Additionally, patient refused meds and Magic cup.  I discussed p.o. intake with patient.  She shares that she eats when she needs to.  I attempted to have her understand  that her p.o. intake is nowhere near the caloric needs that she requires.  However, she says that she eats enough.  I attempted to gauge patient's understanding of her current medical situation as well as with her capacity to make medical decisions independently at this time.  Asked where she is, she says a bedroom.  When asked to further elaborate on what bedroom, she shares she knows it is not hers.  She is unable to tell me that she is in the hospital.  When asked why she is in the hospital, she says cancer.  I gave him brief overview of patient's course of hospitalization and treatment plan.  When asked for her to confirm understanding using the teach back method, she asked, do you know Cliff?!.  When asked to click was, patient could not clarify herself.  She remains unable to participate in goals of care medical decision making independently at this time.  Symptoms assessed.  Patient has no acute complaints of pain, headache, chest pain, N/V/D, or other acute issues at this time.  No adjustment to [facility] needed at this time.  After assessing the patient, I attempted to speak with her daughter/next of kin decision maker Monica over the phone.  Odella has requested that non-urgent phone calls regards to her mother be placed after she is done with work at 330pm.  I called her several times after 330pm.  No answer.  HIPAA compliant voicemail left with PMT contact info given.  Spoke with attending Dr. Trudy.  We are in agreement  that patient is appropriate for discharge home with hospice services.  She has no viable treatment options for her metastatic lung cancer and her poor functional status.  Additionally, she is having little to no p.o. intake with low urine output.  Multiple discussions from previous providers as well as hospice liaison as documented in chart have been held with patient's daughter.  During our last discussion, she remained in agreement with full code and accepting of taking  patient home with home health.    Ongoing support and discussions to continue to ensure daughter understands reality of patient's poor prognosis and to support with goals of care discussions.  Physical Exam Constitutional:      Comments: Thin, frail  HENT:     Head:     Comments: Temporal wasting    Mouth/Throat:     Mouth: Mucous membranes are dry.     Comments: Missing teeth, poor dentition Pulmonary:     Effort: Pulmonary effort is normal.  Abdominal:     Palpations: Abdomen is soft.  Skin:    General: Skin is warm and dry.  Neurological:     Mental Status: She is alert.     Comments: Oriented to self  Psychiatric:        Behavior: Behavior normal.              Recommendations:   Ongoing discussion/education with daughter to ensure she understand severity of patient's overall poor prognosis I recommend home with hospice services but daughter has been resistant to date  25 minute visit includes: Detailed review of medical records (labs, imaging, vital signs), medically appropriate exam (mental status, respiratory, cardiac, skin), discussed with treatment team, counseling and educating patient, family and staff, documenting clinical information, medication management and coordination of care.  Lamarr L. Arvid, DNP, FNP-BC Palliative Medicine Team

## 2024-10-31 NOTE — Progress Notes (Signed)
 PROGRESS NOTE    Doris Johnson  FMW:[id number] DOB: 24-Jan-1951 DOA: 10/22/2024 PCP: Epifanio Alm [name], MD   Assessment & Plan:   Principal Problem:   Altered mental status Active Problems:   Protein-calorie malnutrition, severe   Hypophosphatemia   Pressure injury of skin   Hypercholesteremia   Metastasis to brain (HCC)   Sepsis secondary to UTI (HCC)   Failure to thrive in adult   Small cell lung cancer, left (HCC)   Acute encephalopathy  Assessment and Plan: Acute encephalopathy: likely secondary to UTI, hypernatremia and brain mets from lung cancer. Mental status has not improved much. Re-oriented prn. Talked w/ pt's oncologist at [facility] on 10/28/24 who recommended started IV decadron  10mg  q8hr x 2 days in hopes to improve poor mental status possibly secondary to neurotoxicity from immunotherapy pt has been receiving for lung cancer but minimal to improvement so far. Completed 2 days of high dose IV decadron    Hypernatremia: resolved   Hypophosphatemia: sodium phosphate  given.    Severe protein calorie malnutrition: encourage po intake    Pressure injury of skin: continue w/ supportive care   Bicytopenia: possibly secondary to recent chemo. WBC is WNL currently. No need for a transfusion currently    Failure to thrive in adult: fam does not want hospice at this time. Continue w/ supportive care    Sepsis: see Dr. Stacy note on how pt met sepsis criteria. Secondary to UTI. Completed abx course. Resolved   Lung cancer: w/ mets to the brain. Overall poor prognosis. Onco following and recs apprec. Palliative care following and recs apprec      DVT prophylaxis: eliquis  Code Status: full  Family Communication: called pt's daughter, Odella, no answer  Disposition Plan: likely d/c back home w/ HH  Level of care: Telemetry  Status is: Inpatient Remains inpatient appropriate because: severity of illness    Consultants:  Palliative care  Procedures:    Antimicrobials:   Subjective: Pt is pleasantly confused   Objective: Vitals:   10/31/24 0452 10/31/24 0455 10/31/24 0744 10/31/24 0755  BP: 119/64  98/61 (!) 108/57  Pulse: (!) 52  (!) 50 65  Resp: 16  19 18   Temp: (!) 96.4 F (35.8 C)  97.7 F (36.5 C) 97.9 F (36.6 C)  TempSrc: Axillary  Oral Oral  SpO2: 96%  100% 99%  Weight:  64.9 kg    Height:        Intake/Output Summary (Last 24 hours) at 10/31/2024 1006 Last data filed at 10/31/2024 0455 Gross per 24 hour  Intake 20 ml  Output 350 ml  Net -330 ml   Filed Weights   10/28/24 0442 10/29/24 0155 10/31/24 0455  Weight: 64.3 kg 64.1 kg 64.9 kg    Examination:  General exam: appears calm but confused  Respiratory system: decreased breath sounds b/l Cardiovascular system: S1 & S2+. No rubs or clicks  Gastrointestinal system: abd is soft, NT, ND , hypoactive bowel sounds Central nervous system: alert & awake Psychiatry: judgement and insight appears poor      Data Reviewed: I have personally reviewed following labs and imaging studies  CBC: Recent Labs  Lab 10/25/24 1147 10/27/24 0500 10/28/24 0431 10/29/24 1021 10/30/24 0537  WBC 3.1* 3.8* 3.7* 5.6 6.4  HGB 10.6* 9.5* 10.1* 10.3* 9.6*  HCT 31.2* 28.8* 29.4* 30.4* 28.4*  MCV 89.1 89.4 87.8 87.6 89.6  PLT 91* 101* 103* 142* 133*   Basic Metabolic Panel: Recent Labs  Lab 10/25/24 0518 10/26/24 0345 10/27/24  0500 10/27/24 1724 10/28/24 0431 10/29/24 1021 10/30/24 0537  NA 149* 149* 147* 127* 143 140  --   K 4.2 3.8 3.4*  --  3.2* 4.7  --   CL 120* 117* 115*  --  107 108  --   CO2 22 23 24   --  23 24  --   GLUCOSE 117* 106* 100*  --  119* 129*  --   BUN 14 12 14   --  11 12  --   CREATININE 0.52 0.44 0.40*  --  0.39* 0.61  --   CALCIUM 9.5 9.5 9.6  --  9.3 9.5  --   MG 2.0 1.9 1.9  --  1.9  --  2.1  PHOS 1.3* 1.5* 2.4*  --  2.4*  --  2.1*   GFR: Estimated Creatinine Clearance: 55.9 mL/min (by C-G formula based on SCr of 0.61  mg/dL). Liver Function Tests: No results for input(s): AST, ALT, ALKPHOS, BILITOT, PROT, ALBUMIN in the last 168 hours.  No results for input(s): LIPASE, AMYLASE in the last 168 hours. No results for input(s): AMMONIA in the last 168 hours. Coagulation Profile: No results for input(s): INR, PROTIME in the last 168 hours.  Cardiac Enzymes: No results for input(s): CKTOTAL, CKMB, CKMBINDEX, TROPONINI in the last 168 hours. BNP (last 3 results) No results for input(s): PROBNP in the last 8760 hours. HbA1C: No results for input(s): HGBA1C in the last 72 hours. CBG: Recent Labs  Lab 10/30/24 0810 10/30/24 1141 10/30/24 1650 10/30/24 2140 10/31/24 0745  GLUCAP 116* 168* 174* 96 117*   Lipid Profile: No results for input(s): CHOL, HDL, LDLCALC, TRIG, CHOLHDL, LDLDIRECT in the last 72 hours. Thyroid  Function Tests: No results for input(s): TSH, T4TOTAL, FREET4, T3FREE, THYROIDAB in the last 72 hours. Anemia Panel: No results for input(s): VITAMINB12, FOLATE, FERRITIN, TIBC, IRON, RETICCTPCT in the last 72 hours. Sepsis Labs: No results for input(s): PROCALCITON, LATICACIDVEN in the last 168 hours.   Recent Results (from the past 240 hours)  Culture, blood (Routine x 2)     Status: Abnormal   Collection Time: 10/22/24 12:44 PM   Specimen: BLOOD  Result Value Ref Range Status   Specimen Description   Final    BLOOD BLOOD LEFT HAND Performed at [facility], [address] [place], [place] [address]    Special Requests   Final    BOTTLES DRAWN AEROBIC AND ANAEROBIC Blood Culture results may not be optimal due to an inadequate volume of blood received in culture bottles Performed at [facility], [address], [place], [place] [address]    Culture  Setup Time   Final    GRAM NEGATIVE RODS IN BOTH AEROBIC AND ANAEROBIC BOTTLES CRITICAL VALUE NOTED.  VALUE IS CONSISTENT WITH PREVIOUSLY  REPORTED AND CALLED VALUE. Performed at [facility], [address] [place], [place] [address]    Culture (A)  Final    ESCHERICHIA COLI SUSCEPTIBILITIES PERFORMED ON PREVIOUS CULTURE WITHIN THE LAST 5 DAYS. Performed at [facility], [address], [place], [place] [address]    Report Status 10/25/2024 FINAL  Final  Culture, blood (Routine x 2)     Status: Abnormal   Collection Time: 10/22/24 12:44 PM   Specimen: Left Antecubital; Blood  Result Value Ref Range Status   Specimen Description   Final    LEFT ANTECUBITAL Performed at [facility], [address] [place], [place] [address]    Special Requests  Final    BOTTLES DRAWN AEROBIC AND ANAEROBIC Blood Culture results may not be optimal due to an inadequate volume of blood received in culture bottles Performed at [facility], [address] [place], [place] [address]    Culture  Setup Time   Final    GRAM NEGATIVE RODS IN BOTH AEROBIC AND ANAEROBIC BOTTLES CRITICAL RESULT CALLED TO, READ BACK BY AND VERIFIED WITH: NATHAN BELUE PHARMD @0103  10/23/24 ASW Performed at [facility], [address], [place], [place] [address]    Culture ESCHERICHIA COLI (A)  Final   Report Status 10/25/2024 FINAL  Final   Organism ID, Bacteria ESCHERICHIA COLI  Final      Susceptibility   Escherichia coli - MIC*    AMPICILLIN <=2 SENSITIVE Sensitive     CEFAZOLIN  (NON-URINE) <=1 SENSITIVE Sensitive     CEFEPIME  <=0.12 SENSITIVE Sensitive     ERTAPENEM <=0.12 SENSITIVE Sensitive     CEFTRIAXONE  <=0.25 SENSITIVE Sensitive     CIPROFLOXACIN  <=0.06 SENSITIVE Sensitive     GENTAMICIN  <=1 SENSITIVE Sensitive     MEROPENEM <=0.25 SENSITIVE Sensitive     TRIMETH/SULFA <=20 SENSITIVE Sensitive     AMPICILLIN/SULBACTAM <=2 SENSITIVE Sensitive     PIP/TAZO Value in next row Sensitive      <=4 SENSITIVEThis is a modified FDA-approved test that has been validated and its performance characteristics  determined by the reporting laboratory.  This laboratory is certified under the Clinical Laboratory Improvement Amendments CLIA as qualified to perform high complexity clinical laboratory testing.    * ESCHERICHIA COLI  Blood Culture ID Panel (Reflexed)     Status: Abnormal   Collection Time: 10/22/24 12:44 PM  Result Value Ref Range Status   Enterococcus faecalis NOT DETECTED NOT DETECTED Final   Enterococcus Faecium NOT DETECTED NOT DETECTED Final   Listeria monocytogenes NOT DETECTED NOT DETECTED Final   Staphylococcus species NOT DETECTED NOT DETECTED Final   Staphylococcus aureus (BCID) NOT DETECTED NOT DETECTED Final   Staphylococcus epidermidis NOT DETECTED NOT DETECTED Final   Staphylococcus lugdunensis NOT DETECTED NOT DETECTED Final   Streptococcus species NOT DETECTED NOT DETECTED Final   Streptococcus agalactiae NOT DETECTED NOT DETECTED Final   Streptococcus pneumoniae NOT DETECTED NOT DETECTED Final   Streptococcus pyogenes NOT DETECTED NOT DETECTED Final   A.calcoaceticus-baumannii NOT DETECTED NOT DETECTED Final   Bacteroides fragilis NOT DETECTED NOT DETECTED Final   Enterobacterales DETECTED (A) NOT DETECTED Final    Comment: Enterobacterales represent a large order of gram negative bacteria, not a single organism. CRITICAL RESULT CALLED TO, READ BACK BY AND VERIFIED WITH: NATHAN BELUE PHARMD @0103  10/23/24 ASW    Enterobacter cloacae complex NOT DETECTED NOT DETECTED Final   Escherichia coli DETECTED (A) NOT DETECTED Final    Comment: CRITICAL RESULT CALLED TO, READ BACK BY AND VERIFIED WITH: NATHAN BELUE PHARMD @0103  10/23/24 ASW    Klebsiella aerogenes NOT DETECTED NOT DETECTED Final   Klebsiella oxytoca NOT DETECTED NOT DETECTED Final   Klebsiella pneumoniae NOT DETECTED NOT DETECTED Final   Proteus species NOT DETECTED NOT DETECTED Final   Salmonella species NOT DETECTED NOT DETECTED Final   Serratia marcescens NOT DETECTED NOT DETECTED Final   Haemophilus  influenzae NOT DETECTED NOT DETECTED Final   Neisseria meningitidis NOT DETECTED NOT DETECTED Final   Pseudomonas aeruginosa NOT DETECTED NOT DETECTED Final   Stenotrophomonas maltophilia NOT DETECTED NOT DETECTED Final   Candida albicans NOT DETECTED NOT DETECTED Final   Candida auris NOT  DETECTED NOT DETECTED Final   Candida glabrata NOT DETECTED NOT DETECTED Final   Candida krusei NOT DETECTED NOT DETECTED Final   Candida parapsilosis NOT DETECTED NOT DETECTED Final   Candida tropicalis NOT DETECTED NOT DETECTED Final   Cryptococcus neoformans/gattii NOT DETECTED NOT DETECTED Final   CTX-M ESBL NOT DETECTED NOT DETECTED Final   Carbapenem resistance IMP NOT DETECTED NOT DETECTED Final   Carbapenem resistance KPC NOT DETECTED NOT DETECTED Final   Carbapenem resistance NDM NOT DETECTED NOT DETECTED Final   Carbapenem resist OXA 48 LIKE NOT DETECTED NOT DETECTED Final   Carbapenem resistance VIM NOT DETECTED NOT DETECTED Final    Comment: Performed at [facility], [address] [place], [place] [address]  Urine Culture     Status: Abnormal   Collection Time: 10/22/24  1:17 PM   Specimen: Urine, Random  Result Value Ref Range Status   Specimen Description   Final    URINE, RANDOM Performed at [facility], [address] [place], [place] [address]    Special Requests   Final    NONE Reflexed from [id number] Performed at [facility], [address] [place], [place] [address]    Culture >=100,000 COLONIES/mL ESCHERICHIA COLI (A)  Final   Report Status 10/24/2024 FINAL  Final   Organism ID, Bacteria ESCHERICHIA COLI (A)  Final      Susceptibility   Escherichia coli - MIC*    AMPICILLIN <=2 SENSITIVE Sensitive     CEFAZOLIN  (URINE) Value in next row Sensitive      <=1 SENSITIVEThis is a modified FDA-approved test that has been validated and its performance characteristics determined by the reporting laboratory.  This laboratory is certified under the  Clinical Laboratory Improvement Amendments CLIA as qualified to perform high complexity clinical laboratory testing.    CEFEPIME  Value in next row Sensitive      <=1 SENSITIVEThis is a modified FDA-approved test that has been validated and its performance characteristics determined by the reporting laboratory.  This laboratory is certified under the Clinical Laboratory Improvement Amendments CLIA as qualified to perform high complexity clinical laboratory testing.    ERTAPENEM Value in next row Sensitive      <=1 SENSITIVEThis is a modified FDA-approved test that has been validated and its performance characteristics determined by the reporting laboratory.  This laboratory is certified under the Clinical Laboratory Improvement Amendments CLIA as qualified to perform high complexity clinical laboratory testing.    CEFTRIAXONE  Value in next row Sensitive      <=1 SENSITIVEThis is a modified FDA-approved test that has been validated and its performance characteristics determined by the reporting laboratory.  This laboratory is certified under the Clinical Laboratory Improvement Amendments CLIA as qualified to perform high complexity clinical laboratory testing.    CIPROFLOXACIN  Value in next row Sensitive      <=1 SENSITIVEThis is a modified FDA-approved test that has been validated and its performance characteristics determined by the reporting laboratory.  This laboratory is certified under the Clinical Laboratory Improvement Amendments CLIA as qualified to perform high complexity clinical laboratory testing.    GENTAMICIN  Value in next row Sensitive      <=1 SENSITIVEThis is a modified FDA-approved test that has been validated and its performance characteristics determined by the reporting laboratory.  This laboratory is certified under the Clinical Laboratory Improvement Amendments CLIA as qualified to perform high complexity clinical laboratory testing.    NITROFURANTOIN Value in next row Sensitive       <=  1 SENSITIVEThis is a modified FDA-approved test that has been validated and its performance characteristics determined by the reporting laboratory.  This laboratory is certified under the Clinical Laboratory Improvement Amendments CLIA as qualified to perform high complexity clinical laboratory testing.    TRIMETH/SULFA Value in next row Sensitive      <=1 SENSITIVEThis is a modified FDA-approved test that has been validated and its performance characteristics determined by the reporting laboratory.  This laboratory is certified under the Clinical Laboratory Improvement Amendments CLIA as qualified to perform high complexity clinical laboratory testing.    AMPICILLIN/SULBACTAM Value in next row Sensitive      <=1 SENSITIVEThis is a modified FDA-approved test that has been validated and its performance characteristics determined by the reporting laboratory.  This laboratory is certified under the Clinical Laboratory Improvement Amendments CLIA as qualified to perform high complexity clinical laboratory testing.    PIP/TAZO Value in next row Sensitive      <=4 SENSITIVEThis is a modified FDA-approved test that has been validated and its performance characteristics determined by the reporting laboratory.  This laboratory is certified under the Clinical Laboratory Improvement Amendments CLIA as qualified to perform high complexity clinical laboratory testing.    MEROPENEM Value in next row Sensitive      <=4 SENSITIVEThis is a modified FDA-approved test that has been validated and its performance characteristics determined by the reporting laboratory.  This laboratory is certified under the Clinical Laboratory Improvement Amendments CLIA as qualified to perform high complexity clinical laboratory testing.    * >=100,000 COLONIES/mL ESCHERICHIA COLI         Radiology Studies: No results found.       Scheduled Meds:  amLODipine   5 mg Oral Daily   apixaban   5 mg Oral BID   Chlorhexidine   Gluconate Cloth  6 each Topical Daily   dexamethasone   4 mg Oral Q12H   feeding supplement  237 mL Oral TID BM   insulin  aspart  0-5 Units Subcutaneous QHS   insulin  aspart  0-9 Units Subcutaneous TID WC   mirtazapine   15 mg Oral QHS   multivitamin with minerals  1 tablet Oral Daily   OLANZapine   5 mg Oral QHS   sodium chloride  flush  10-40 mL Intracatheter Q12H   traZODone   50 mg Oral QHS   Continuous Infusions:      LOS: 8 days       Anthony CHRISTELLA Pouch, MD [facility] Pager [phone]  If 7PM-7AM, please contact night-coverage [email or web address] 10/31/2024, 10:06 AM

## 2024-11-01 DIAGNOSIS — G9340 Encephalopathy, unspecified: Secondary | ICD-10-CM | POA: Diagnosis not present

## 2024-11-01 DIAGNOSIS — G9341 Metabolic encephalopathy: Secondary | ICD-10-CM

## 2024-11-01 DIAGNOSIS — R7881 Bacteremia: Secondary | ICD-10-CM

## 2024-11-01 DIAGNOSIS — E871 Hypo-osmolality and hyponatremia: Secondary | ICD-10-CM

## 2024-11-01 DIAGNOSIS — B9620 Unspecified Escherichia coli [E. coli] as the cause of diseases classified elsewhere: Secondary | ICD-10-CM

## 2024-11-01 LAB — GLUCOSE, CAPILLARY
Glucose-Capillary: 113 mg/dL — ABNORMAL HIGH (ref 70–99)
Glucose-Capillary: 103 mg/dL — ABNORMAL HIGH (ref 70–99)
Glucose-Capillary: 104 mg/dL — ABNORMAL HIGH (ref 70–99)
Glucose-Capillary: 110 mg/dL — ABNORMAL HIGH (ref 70–99)

## 2024-11-01 LAB — CBC
HCT: 29 % — ABNORMAL LOW (ref 36.0–46.0)
Hemoglobin: 9.6 g/dL — ABNORMAL LOW (ref 12.0–15.0)
MCH: 30.1 pg (ref 26.0–34.0)
MCHC: 33.1 g/dL (ref 30.0–36.0)
MCV: 90.9 fL (ref 80.0–100.0)
Platelets: 195 K/uL (ref 150–400)
RBC: 3.19 MIL/uL — ABNORMAL LOW (ref 3.87–5.11)
RDW: 17 % — ABNORMAL HIGH (ref 11.5–15.5)
WBC: 7.4 K/uL (ref 4.0–10.5)
nRBC: 0.4 % — ABNORMAL HIGH (ref 0.0–0.2)

## 2024-11-01 LAB — PHOSPHORUS: Phosphorus: 2.3 mg/dL — ABNORMAL LOW (ref 2.5–4.6)

## 2024-11-01 MED ORDER — MAGIC MOUTHWASH W/LIDOCAINE
5.0000 mL | Freq: Three times a day (TID) | ORAL | Status: DC | PRN
  Administered 2024-11-01 – 2024-11-02 (×2): 5 mL via ORAL
  Filled 2024-11-01 (×4): qty 5

## 2024-11-01 MED ORDER — POTASSIUM PHOSPHATES 15 MMOLE/5ML IV SOLN
30.0000 mmol | Freq: Once | INTRAVENOUS | Status: AC
  Administered 2024-11-01: 30 mmol via INTRAVENOUS
  Filled 2024-11-01: qty 10

## 2024-11-01 NOTE — Plan of Care (Signed)
  Problem: Fluid Volume: Goal: Hemodynamic stability will improve Outcome: Progressing   Problem: Clinical Measurements: Goal: Signs and symptoms of infection will decrease Outcome: Progressing

## 2024-11-01 NOTE — Progress Notes (Signed)
 Patient with very poor appetite, refusing to eat or drink. Only tolerated about 50cc of ensure and 3 spoon fulls of chocolate pudding. Very minimal urine output. Noted with a minimally wet pad. Bladder scan 178cc. Vital signs stable.

## 2024-11-01 NOTE — TOC Progression Note (Signed)
 Transition of Care [facility]) - Progression Note    Patient Details  Name: Doris Johnson MRN: [id number] Date of Birth: [birth year]  [facility]) CM/SW Contact  [name], RN Phone Number: 11/01/2024, 11:33 AM  Clinical Narrative:     Current plan is for patient to discharge with [facility] and hospital bed when medically appropriate.  Per Medical team patient is appropriate for hospice services.  Will wait to order DME until discission is confirmed     Barriers to Discharge: Continued Medical Work up               Expected Discharge Plan and Services       Living arrangements for the past 2 months: Apartment                                       Social Drivers of Health (SDOH) Interventions SDOH Screenings   Food Insecurity: No Food Insecurity (08/16/2024)   Received from [facility]  Housing: Low Risk  (08/16/2024)   Received from [facility]  Transportation Needs: No Transportation Needs (08/16/2024)   Received from [facility]  Utilities: Not At Risk (08/16/2024)   Received from [facility]  Depression [phone]): Low Risk (07/02/2024)  Financial Resource Strain: Low Risk  (08/16/2024)   Received from [facility]  Tobacco Use: Low Risk (10/25/2024)  Recent Concern: Tobacco Use - Medium Risk (10/07/2024)   Received from [facility]    Readmission Risk Interventions    10/25/2024    9:42 AM  Readmission Risk Prevention Plan  Transportation Screening Complete  PCP or Specialist Appt within 3-5 Days Complete  Palliative Care Screening Complete  Medication Review (RN Care Manager) Complete

## 2024-11-01 NOTE — Progress Notes (Signed)
 "                                                                                                                                                                                                          Daily Progress Note   Patient Name: Doris Johnson       Date: 11/01/2024 DOB: [birth year]  Age: 73 y.o. MRN#: [id number] Attending Physician: Trudy Anthony HERO, MD Primary Care Physician: Epifanio Alm [name], MD Admit Date: 10/22/2024  Reason for Consultation/Follow-up: Establishing goals of care  HPI/Brief Hospital Review: 73 y.o. female  with past medical history of lung cancer with diffuse metastasis, non-insulin -dependent diabetes mellitus, hypertension, hypothyroidism admitted on 10/22/2024 with AMS and lethargy.   As per chart review, pt presented to ED for evaluation of decreased urine output, lethargy, and altered mentation over the past several days PTA.    Workup revealed blood cultures positive for E. Coli bacteremia. Pt is being treated for E.coli bacteremia, decreased urine output and oral intake, acute metabolic encephalopathy, hypernatremia, and (suspected) severe protein calorie malnutrition.   Subjective: Extensive chart review has been completed prior to meeting patient including labs, vital signs, imaging, progress notes, orders, and available advanced directive documents from current and previous encounters.    Visited with Ms. Bennison at her bedside. She is resting in bed with eyes closed, able to briefly open eyes with calling of her name. She attempts to nod her head to simple questioning but response is delayed. She does not make attempts to verbally communicate or follow commands. Noted facial grimacing as she attempts to reposition in bed, nods no when asked if she needs assistance or inquiring about acute pain. No family or visitors at bedside during time of visit.  Per previous providers notes and visits, daughter-Monica requests to be called after 3:30 pm when  she is off work. Call placed to [facility] at [phone] unsuccessfully. Will await return phone call.  PMT will continue to follow and provide support for patient and family during hospitalization.  Objective:  Physical Exam Constitutional:      General: She is sleeping. She is not in acute distress.    Appearance: She is ill-appearing.  HENT:     Head: Normocephalic.     Mouth/Throat:     Mouth: Mucous membranes are dry.  Pulmonary:     Effort: Pulmonary effort is normal. No respiratory distress.  Abdominal:     General: There is no distension.     Palpations: Abdomen is soft.     Tenderness: There is no abdominal tenderness.  Skin:    General: Skin is warm and dry.  Neurological:     Mental Status: She is easily aroused.     Motor: Weakness present.             Vital Signs: BP 113/64 (BP Location: Left Arm)   Pulse (!) 45   Temp (!) 97.3 F (36.3 C)   Resp 17   Ht 5' 3 (1.6 m)   Wt 63.6 kg   SpO2 100%   BMI 24.84 kg/m  SpO2: SpO2: 100 % O2 Device: O2 Device: Room Air O2 Flow Rate:     Palliative Care Assessment & Plan   Assessment/Recommendation/Plan  Continue with current plan of care  Thank you for allowing the Palliative Medicine Team to assist in the care of this patient.  Visit includes: Detailed review of medical records (labs, imaging, vital signs), medically appropriate exam (mental status, respiratory, cardiac, skin), discussed with treatment team, counseling and educating patient, family and staff, documenting clinical information, medication management and coordination of care.  Waddell Lesches, DNP, AGNP-C Palliative Medicine   Please contact Palliative Medicine Team phone at [phone] for questions and concerns.   "

## 2024-11-01 NOTE — Progress Notes (Signed)
 " PROGRESS NOTE    Doris Johnson  FMW:[id number] DOB: Jan 05, 1951 DOA: 10/22/2024 PCP: Epifanio Alm Doris Johnson   Assessment & Plan:   Principal Problem:   Altered mental status Active Problems:   Protein-calorie malnutrition, severe   Hypophosphatemia   Pressure injury of skin   Hypercholesteremia   Metastasis to brain (HCC)   Sepsis secondary to UTI (HCC)   Failure to thrive in adult   Small cell lung cancer, left (HCC)   Acute encephalopathy  Assessment and Plan: Acute encephalopathy: likely secondary to UTI, hypernatremia and brain mets from lung cancer. Mental status has not improved much. Re-oriented prn. Talked w/ pt's oncologist at [facility] on 10/28/24 who recommended started IV decadron  10mg  q8hr x 2 days in hopes to improve poor mental status possibly secondary to neurotoxicity from immunotherapy pt has been receiving for lung cancer but minimal to improvement so far. Completed 2 days of high dose IV decadron    Hypernatremia: resolved   Hypophosphatemia: potassium phosphate  given.    Severe protein calorie malnutrition: poor po intake. Encourage po intake    Pressure injury of skin: continue w/ supportive care  Normocytic anemia: WBC and platelets are now both WNL. No need for a transfusion currently    Failure to thrive in adult: continue w/ supportive care. Palliative care following and recs apprec    Sepsis: see Dr. Stacy note on how pt met sepsis criteria. Secondary to UTI. Completed abx course. Resolved   Lung cancer: w/ mets to the brain. Overall prognosis is poor. Onco following and recs apprec. Palliative care following and recs apprec      DVT prophylaxis: eliquis  Code Status: full  Family Communication: called pt's daughter, Doris Johnson, no answer  Disposition Plan: likely d/c back home w/ HH  Level of care: Telemetry  Status is: Inpatient Remains inpatient appropriate because: severity of illness    Consultants:  Palliative care  Procedures:    Antimicrobials:   Subjective: Pt denies any pain   Objective: Vitals:   10/31/24 1956 11/01/24 0500 11/01/24 0511 11/01/24 0756  BP: 103/64  114/65 113/64  Pulse: (!) 50  (!) 48 (!) 45  Resp: 18  17 17   Temp: 97.6 F (36.4 C)  98 F (36.7 C) (!) 97.3 F (36.3 C)  TempSrc: Oral     SpO2: 100%  100% 100%  Weight:  63.6 kg    Height:        Intake/Output Summary (Last 24 hours) at 11/01/2024 0941 Last data filed at 11/01/2024 0500 Gross per 24 hour  Intake 50 ml  Output 578 ml  Net -528 ml   Filed Weights   10/29/24 0155 10/31/24 0455 11/01/24 0500  Weight: 64.1 kg 64.9 kg 63.6 kg    Examination:  General exam: appears calm. Frail appearing   Respiratory system: diminished breath sounds b/l Cardiovascular system: S1 & S2+. No rubs or clicks Gastrointestinal system: abd is soft, NT, ND & hypoactive bowel sounds Central nervous system: alert & awake Psychiatry: judgement and insight appears poor     Data Reviewed: I have personally reviewed following labs and imaging studies  CBC: Recent Labs  Lab 10/27/24 0500 10/28/24 0431 10/29/24 1021 10/30/24 0537 11/01/24 0500  WBC 3.8* 3.7* 5.6 6.4 7.4  HGB 9.5* 10.1* 10.3* 9.6* 9.6*  HCT 28.8* 29.4* 30.4* 28.4* 29.0*  MCV 89.4 87.8 87.6 89.6 90.9  PLT 101* 103* 142* 133* 195   Basic Metabolic Panel: Recent Labs  Lab 10/26/24 0345 10/27/24 0500 10/27/24  1724 10/28/24 0431 10/29/24 1021 10/30/24 0537 10/31/24 1036 11/01/24 0500  NA 149* 147* 127* 143 140  --  140  --   K 3.8 3.4*  --  3.2* 4.7  --  3.6  --   CL 117* 115*  --  107 108  --  109  --   CO2 23 24  --  23 24  --  24  --   GLUCOSE 106* 100*  --  119* 129*  --  126*  --   BUN 12 14  --  11 12  --  23  --   CREATININE 0.44 0.40*  --  0.39* 0.61  --  0.49  --   CALCIUM 9.5 9.6  --  9.3 9.5  --  9.0  --   MG 1.9 1.9  --  1.9  --  2.1  --   --   PHOS 1.5* 2.4*  --  2.4*  --  2.1*  --  2.3*   GFR: Estimated Creatinine Clearance: 55.4 mL/min  (by C-G formula based on SCr of 0.49 mg/dL). Liver Function Tests: No results for input(s): AST, ALT, ALKPHOS, BILITOT, PROT, ALBUMIN in the last 168 hours.  No results for input(s): LIPASE, AMYLASE in the last 168 hours. No results for input(s): AMMONIA in the last 168 hours. Coagulation Profile: No results for input(s): INR, PROTIME in the last 168 hours.  Cardiac Enzymes: No results for input(s): CKTOTAL, CKMB, CKMBINDEX, TROPONINI in the last 168 hours. BNP (last 3 results) No results for input(s): PROBNP in the last 8760 hours. HbA1C: No results for input(s): HGBA1C in the last 72 hours. CBG: Recent Labs  Lab 10/31/24 0745 10/31/24 1145 10/31/24 1626 10/31/24 2132 11/01/24 0759  GLUCAP 117* 101* 99 99 113*   Lipid Profile: No results for input(s): CHOL, HDL, LDLCALC, TRIG, CHOLHDL, LDLDIRECT in the last 72 hours. Thyroid  Function Tests: No results for input(s): TSH, T4TOTAL, FREET4, T3FREE, THYROIDAB in the last 72 hours. Anemia Panel: No results for input(s): VITAMINB12, FOLATE, FERRITIN, TIBC, IRON, RETICCTPCT in the last 72 hours. Sepsis Labs: No results for input(s): PROCALCITON, LATICACIDVEN in the last 168 hours.   Recent Results (from the past 240 hours)  Culture, blood (Routine x 2)     Status: Abnormal   Collection Time: 10/22/24 12:44 PM   Specimen: BLOOD  Result Value Ref Range Status   Specimen Description   Final    BLOOD BLOOD LEFT HAND Performed at [facility], [address], [place], [place] [address]    Special Requests   Final    BOTTLES DRAWN AEROBIC AND ANAEROBIC Blood Culture results may not be optimal due to an inadequate volume of blood received in culture bottles Performed at [facility], [address] [place], [place] [address]    Culture  Setup Time   Final    GRAM NEGATIVE RODS IN BOTH AEROBIC AND ANAEROBIC BOTTLES CRITICAL VALUE NOTED.   VALUE IS CONSISTENT WITH PREVIOUSLY REPORTED AND CALLED VALUE. Performed at [facility], [address] [place], [place] [address]    Culture (A)  Final    ESCHERICHIA COLI SUSCEPTIBILITIES PERFORMED ON PREVIOUS CULTURE WITHIN THE LAST 5 DAYS. Performed at [facility], [address] [place], [place] [address]    Report Status 10/25/2024 FINAL  Final  Culture, blood (Routine x 2)     Status: Abnormal   Collection Time: 10/22/24 12:44 PM   Specimen: Left Antecubital; Blood  Result  Value Ref Range Status   Specimen Description   Final    LEFT ANTECUBITAL Performed at [facility], [address] [place], [place] [address]    Special Requests   Final    BOTTLES DRAWN AEROBIC AND ANAEROBIC Blood Culture results may not be optimal due to an inadequate volume of blood received in culture bottles Performed at [facility], [address] [place], [place] [address]    Culture  Setup Time   Final    GRAM NEGATIVE RODS IN BOTH AEROBIC AND ANAEROBIC BOTTLES CRITICAL RESULT CALLED TO, READ BACK BY AND VERIFIED WITH: NATHAN BELUE PHARMD @0103  10/23/24 ASW Performed at [facility], [address] [place], [place] [address]    Culture ESCHERICHIA COLI (A)  Final   Report Status 10/25/2024 FINAL  Final   Organism ID, Bacteria ESCHERICHIA COLI  Final      Susceptibility   Escherichia coli - MIC*    AMPICILLIN <=2 SENSITIVE Sensitive     CEFAZOLIN  (NON-URINE) <=1 SENSITIVE Sensitive     CEFEPIME  <=0.12 SENSITIVE Sensitive     ERTAPENEM <=0.12 SENSITIVE Sensitive     CEFTRIAXONE  <=0.25 SENSITIVE Sensitive     CIPROFLOXACIN  <=0.06 SENSITIVE Sensitive     GENTAMICIN  <=1 SENSITIVE Sensitive     MEROPENEM <=0.25 SENSITIVE Sensitive     TRIMETH/SULFA <=20 SENSITIVE Sensitive     AMPICILLIN/SULBACTAM <=2 SENSITIVE Sensitive     PIP/TAZO Value in next row Sensitive      <=4 SENSITIVEThis is a modified FDA-approved test that has been validated and  its performance characteristics determined by the reporting laboratory.  This laboratory is certified under the Clinical Laboratory Improvement Amendments CLIA as qualified to perform high complexity clinical laboratory testing.    * ESCHERICHIA COLI  Blood Culture ID Panel (Reflexed)     Status: Abnormal   Collection Time: 10/22/24 12:44 PM  Result Value Ref Range Status   Enterococcus faecalis NOT DETECTED NOT DETECTED Final   Enterococcus Faecium NOT DETECTED NOT DETECTED Final   Listeria monocytogenes NOT DETECTED NOT DETECTED Final   Staphylococcus species NOT DETECTED NOT DETECTED Final   Staphylococcus aureus (BCID) NOT DETECTED NOT DETECTED Final   Staphylococcus epidermidis NOT DETECTED NOT DETECTED Final   Staphylococcus lugdunensis NOT DETECTED NOT DETECTED Final   Streptococcus species NOT DETECTED NOT DETECTED Final   Streptococcus agalactiae NOT DETECTED NOT DETECTED Final   Streptococcus pneumoniae NOT DETECTED NOT DETECTED Final   Streptococcus pyogenes NOT DETECTED NOT DETECTED Final   A.calcoaceticus-baumannii NOT DETECTED NOT DETECTED Final   Bacteroides fragilis NOT DETECTED NOT DETECTED Final   Enterobacterales DETECTED (A) NOT DETECTED Final    Comment: Enterobacterales represent a large order of gram negative bacteria, not a single organism. CRITICAL RESULT CALLED TO, READ BACK BY AND VERIFIED WITH: NATHAN BELUE PHARMD @0103  10/23/24 ASW    Enterobacter cloacae complex NOT DETECTED NOT DETECTED Final   Escherichia coli DETECTED (A) NOT DETECTED Final    Comment: CRITICAL RESULT CALLED TO, READ BACK BY AND VERIFIED WITH: NATHAN BELUE PHARMD @0103  10/23/24 ASW    Klebsiella aerogenes NOT DETECTED NOT DETECTED Final   Klebsiella oxytoca NOT DETECTED NOT DETECTED Final   Klebsiella pneumoniae NOT DETECTED NOT DETECTED Final   Proteus species NOT DETECTED NOT DETECTED Final   Salmonella species NOT DETECTED NOT DETECTED Final   Serratia marcescens NOT DETECTED NOT  DETECTED Final   Haemophilus influenzae NOT DETECTED NOT DETECTED Final   Neisseria meningitidis NOT DETECTED  NOT DETECTED Final   Pseudomonas aeruginosa NOT DETECTED NOT DETECTED Final   Stenotrophomonas maltophilia NOT DETECTED NOT DETECTED Final   Candida albicans NOT DETECTED NOT DETECTED Final   Candida auris NOT DETECTED NOT DETECTED Final   Candida glabrata NOT DETECTED NOT DETECTED Final   Candida krusei NOT DETECTED NOT DETECTED Final   Candida parapsilosis NOT DETECTED NOT DETECTED Final   Candida tropicalis NOT DETECTED NOT DETECTED Final   Cryptococcus neoformans/gattii NOT DETECTED NOT DETECTED Final   CTX-M ESBL NOT DETECTED NOT DETECTED Final   Carbapenem resistance IMP NOT DETECTED NOT DETECTED Final   Carbapenem resistance KPC NOT DETECTED NOT DETECTED Final   Carbapenem resistance NDM NOT DETECTED NOT DETECTED Final   Carbapenem resist OXA 48 LIKE NOT DETECTED NOT DETECTED Final   Carbapenem resistance VIM NOT DETECTED NOT DETECTED Final    Comment: Performed at [facility], [address], [place], [place] [address]  Urine Culture     Status: Abnormal   Collection Time: 10/22/24  1:17 PM   Specimen: Urine, Random  Result Value Ref Range Status   Specimen Description   Final    URINE, RANDOM Performed at [facility], [address] [place], [place] [address]    Special Requests   Final    NONE Reflexed from [id number] Performed at [facility], [address] [place], [place] [address]    Culture >=100,000 COLONIES/mL ESCHERICHIA COLI (A)  Final   Report Status 10/24/2024 FINAL  Final   Organism ID, Bacteria ESCHERICHIA COLI (A)  Final      Susceptibility   Escherichia coli - MIC*    AMPICILLIN <=2 SENSITIVE Sensitive     CEFAZOLIN  (URINE) Value in next row Sensitive      <=1 SENSITIVEThis is a modified FDA-approved test that has been validated and its performance characteristics determined by the reporting laboratory.  This  laboratory is certified under the Clinical Laboratory Improvement Amendments CLIA as qualified to perform high complexity clinical laboratory testing.    CEFEPIME  Value in next row Sensitive      <=1 SENSITIVEThis is a modified FDA-approved test that has been validated and its performance characteristics determined by the reporting laboratory.  This laboratory is certified under the Clinical Laboratory Improvement Amendments CLIA as qualified to perform high complexity clinical laboratory testing.    ERTAPENEM Value in next row Sensitive      <=1 SENSITIVEThis is a modified FDA-approved test that has been validated and its performance characteristics determined by the reporting laboratory.  This laboratory is certified under the Clinical Laboratory Improvement Amendments CLIA as qualified to perform high complexity clinical laboratory testing.    CEFTRIAXONE  Value in next row Sensitive      <=1 SENSITIVEThis is a modified FDA-approved test that has been validated and its performance characteristics determined by the reporting laboratory.  This laboratory is certified under the Clinical Laboratory Improvement Amendments CLIA as qualified to perform high complexity clinical laboratory testing.    CIPROFLOXACIN  Value in next row Sensitive      <=1 SENSITIVEThis is a modified FDA-approved test that has been validated and its performance characteristics determined by the reporting laboratory.  This laboratory is certified under the Clinical Laboratory Improvement Amendments CLIA as qualified to perform high complexity clinical laboratory testing.    GENTAMICIN  Value in next row Sensitive      <=1 SENSITIVEThis is a modified FDA-approved test that has been validated and its performance characteristics determined by the reporting laboratory.  This laboratory is certified under the Clinical Laboratory Improvement Amendments CLIA as qualified to perform high complexity clinical laboratory testing.     NITROFURANTOIN Value in next row Sensitive      <=1 SENSITIVEThis is a modified FDA-approved test that has been validated and its performance characteristics determined by the reporting laboratory.  This laboratory is certified under the Clinical Laboratory Improvement Amendments CLIA as qualified to perform high complexity clinical laboratory testing.    TRIMETH/SULFA Value in next row Sensitive      <=1 SENSITIVEThis is a modified FDA-approved test that has been validated and its performance characteristics determined by the reporting laboratory.  This laboratory is certified under the Clinical Laboratory Improvement Amendments CLIA as qualified to perform high complexity clinical laboratory testing.    AMPICILLIN/SULBACTAM Value in next row Sensitive      <=1 SENSITIVEThis is a modified FDA-approved test that has been validated and its performance characteristics determined by the reporting laboratory.  This laboratory is certified under the Clinical Laboratory Improvement Amendments CLIA as qualified to perform high complexity clinical laboratory testing.    PIP/TAZO Value in next row Sensitive      <=4 SENSITIVEThis is a modified FDA-approved test that has been validated and its performance characteristics determined by the reporting laboratory.  This laboratory is certified under the Clinical Laboratory Improvement Amendments CLIA as qualified to perform high complexity clinical laboratory testing.    MEROPENEM Value in next row Sensitive      <=4 SENSITIVEThis is a modified FDA-approved test that has been validated and its performance characteristics determined by the reporting laboratory.  This laboratory is certified under the Clinical Laboratory Improvement Amendments CLIA as qualified to perform high complexity clinical laboratory testing.    * >=100,000 COLONIES/mL ESCHERICHIA COLI         Radiology Studies: No results found.       Scheduled Meds:  amLODipine   5 mg Oral Daily    apixaban   5 mg Oral BID   Chlorhexidine  Gluconate Cloth  6 each Topical Daily   dexamethasone   4 mg Oral Q12H   feeding supplement  237 mL Oral TID BM   insulin  aspart  0-5 Units Subcutaneous QHS   insulin  aspart  0-9 Units Subcutaneous TID WC   mirtazapine   15 mg Oral QHS   multivitamin with minerals  1 tablet Oral Daily   OLANZapine   5 mg Oral QHS   sodium chloride  flush  10-40 mL Intracatheter Q12H   traZODone   50 mg Oral QHS   Continuous Infusions:  potassium PHOSPHATE  IVPB (in mmol)         LOS: 9 days       Anthony CHRISTELLA Pouch, [facility] Pager [phone]  If 7PM-7AM, please contact night-coverage [email or web address] 11/01/2024, 9:41 AM   "

## 2024-11-01 NOTE — Plan of Care (Signed)
" °  Problem: Clinical Measurements: Goal: Signs and symptoms of infection will decrease Outcome: Progressing   Problem: Respiratory: Goal: Ability to maintain adequate ventilation will improve Outcome: Progressing   Problem: Clinical Measurements: Goal: Ability to maintain clinical measurements within normal limits will improve Outcome: Progressing Goal: Will remain free from infection Outcome: Progressing   Problem: Pain Managment: Goal: General experience of comfort will improve and/or be controlled Outcome: Progressing   Problem: Safety: Goal: Ability to remain free from injury will improve Outcome: Progressing   Problem: Education: Goal: Knowledge of General Education information will improve Description: Including pain rating scale, medication(s)/side effects and non-pharmacologic comfort measures Outcome: Not Progressing   Problem: Activity: Goal: Risk for activity intolerance will decrease Outcome: Not Progressing   Problem: Nutrition: Goal: Adequate nutrition will be maintained Outcome: Not Progressing   "

## 2024-11-02 DIAGNOSIS — G9340 Encephalopathy, unspecified: Secondary | ICD-10-CM | POA: Diagnosis not present

## 2024-11-02 LAB — GLUCOSE, CAPILLARY
Glucose-Capillary: 108 mg/dL — ABNORMAL HIGH (ref 70–99)
Glucose-Capillary: 129 mg/dL — ABNORMAL HIGH (ref 70–99)
Glucose-Capillary: 87 mg/dL (ref 70–99)
Glucose-Capillary: 75 mg/dL (ref 70–99)

## 2024-11-02 NOTE — Progress Notes (Signed)
 " PROGRESS NOTE   HPI was taken from Dr. Jhonny: Doris Johnson is a 73 y.o. female with medical history significant of lung cancer with diffuse metastasis, non-insulin -dependent diabetes mellitus, hypertension, hypothyroidism who presents to the ED for evaluation of decreased urinary output, lethargy, altered mentation over the past several days.  Patient was brought in by family.   Unable to provide history.  History obtained by speaking with the ED provider and reading medical record..  Per medical record EMS reports decreased movement and loss of appetite for 2 days.  Diaper at home has been only been changed once with no urinary output.  Known lung cancer with diffuse metastasis including cerebral metastasis gets treatment every 2 weeks at [facility] oncology.  Has not gone this week.   On presentation patient is ill-appearing.  She is very lethargic.  She is oriented to person.  Does not provide extensive history.  No family at bedside at time of my evaluation.  Vital signs stable.   ED Course: Treated for undifferentiated sepsis.  Patient was given broad-spectrum IV antibiotics and a bolus of intravenous fluids.  Hospitalist contacted for admission   Doris Johnson  FMW:[id number] DOB: 1950-12-12 DOA: 10/22/2024 PCP: Epifanio Alm [name], MD   Assessment & Plan:   Principal Problem:   Altered mental status Active Problems:   Protein-calorie malnutrition, severe   Hypophosphatemia   Pressure injury of skin   Hypercholesteremia   Metastasis to brain (HCC)   Sepsis secondary to UTI (HCC)   Failure to thrive in adult   Small cell lung cancer, left (HCC)   Acute encephalopathy  Assessment and Plan: Acute encephalopathy: likely secondary to UTI, hypernatremia and brain mets from lung cancer. Mental status has not improved much. Re-oriented prn. Talked w/ pt's oncologist at [facility] on 10/28/24 who recommended started IV decadron  10mg  q8hr x 2 days in hopes to improve poor mental status  possibly secondary to neurotoxicity from immunotherapy pt has been receiving for lung cancer but minimal to improvement so far. Completed 2 days of high dose IV decadron . Continue on home dose of decadron    Hypernatremia: resolved   Hypophosphatemia: will re-check in AM    Severe protein calorie malnutrition: poor po intake. Encourage po intake    Pressure injury of skin: continue w/ supportive care  Normocytic anemia: WBC and platelets are now both WNL.  No need for a transfusion currently    Failure to thrive in adult: continue w/ supportive care. Palliative care following and recs apprec    Sepsis: see Dr. Stacy note on how pt met sepsis criteria. Secondary to UTI. Completed abx course. Resolved   Lung cancer: w/ mets to the brain. Overall prognosis is poor. Onco recs apprec. Palliative care following and recs apprec      DVT prophylaxis: eliquis  Code Status: full  Family Communication: called pt's daughter, Odella, no answer. Unable to reach pt's daughter x 3 days  Disposition Plan: likely d/c back home w/ HH  Level of care: Telemetry  Status is: Inpatient Remains inpatient appropriate because: severity of illness    Consultants:  Palliative care  Procedures:   Antimicrobials:   Subjective: Pt c/o fatigue   Objective: Vitals:   11/02/24 0413 11/02/24 0423 11/02/24 0500 11/02/24 0731  BP: (!) 94/49 (!) 100/58  (!) 107/58  Pulse: (!) 40 (!) 54  (!) 42  Resp: 18   16  Temp: 97.6 F (36.4 C)     TempSrc:      SpO2:  100% 92%    Weight:   59.2 kg   Height:        Intake/Output Summary (Last 24 hours) at 11/02/2024 0916 Last data filed at 11/02/2024 0410 Gross per 24 hour  Intake 0 ml  Output 750 ml  Net -750 ml   Filed Weights   10/31/24 0455 11/01/24 0500 11/02/24 0500  Weight: 64.9 kg 63.6 kg 59.2 kg    Examination:  General exam: appears older than stated age. Poor dentition  Respiratory system: decreased breath sounds b/l  Cardiovascular  system: S1 & S2+. No rubs or clicks  Gastrointestinal system: abd is soft, NT, ND & hypoactive bowel sounds  Central nervous system: alert & awake Psychiatry: judgement and insight appears poor. Flat mood and affect    Data Reviewed: I have personally reviewed following labs and imaging studies  CBC: Recent Labs  Lab 10/27/24 0500 10/28/24 0431 10/29/24 1021 10/30/24 0537 11/01/24 0500  WBC 3.8* 3.7* 5.6 6.4 7.4  HGB 9.5* 10.1* 10.3* 9.6* 9.6*  HCT 28.8* 29.4* 30.4* 28.4* 29.0*  MCV 89.4 87.8 87.6 89.6 90.9  PLT 101* 103* 142* 133* 195   Basic Metabolic Panel: Recent Labs  Lab 10/27/24 0500 10/27/24 1724 10/28/24 0431 10/29/24 1021 10/30/24 0537 10/31/24 1036 11/01/24 0500  NA 147* 127* 143 140  --  140  --   K 3.4*  --  3.2* 4.7  --  3.6  --   CL 115*  --  107 108  --  109  --   CO2 24  --  23 24  --  24  --   GLUCOSE 100*  --  119* 129*  --  126*  --   BUN 14  --  11 12  --  23  --   CREATININE 0.40*  --  0.39* 0.61  --  0.49  --   CALCIUM 9.6  --  9.3 9.5  --  9.0  --   MG 1.9  --  1.9  --  2.1  --   --   PHOS 2.4*  --  2.4*  --  2.1*  --  2.3*   GFR: Estimated Creatinine Clearance: 51 mL/min (by C-G formula based on SCr of 0.49 mg/dL). Liver Function Tests: No results for input(s): AST, ALT, ALKPHOS, BILITOT, PROT, ALBUMIN in the last 168 hours.  No results for input(s): LIPASE, AMYLASE in the last 168 hours. No results for input(s): AMMONIA in the last 168 hours. Coagulation Profile: No results for input(s): INR, PROTIME in the last 168 hours.  Cardiac Enzymes: No results for input(s): CKTOTAL, CKMB, CKMBINDEX, TROPONINI in the last 168 hours. BNP (last 3 results) No results for input(s): PROBNP in the last 8760 hours. HbA1C: No results for input(s): HGBA1C in the last 72 hours. CBG: Recent Labs  Lab 11/01/24 0759 11/01/24 1130 11/01/24 1752 11/01/24 2147 11/02/24 0730  GLUCAP 113* 103* 104* 110* 108*   Lipid  Profile: No results for input(s): CHOL, HDL, LDLCALC, TRIG, CHOLHDL, LDLDIRECT in the last 72 hours. Thyroid  Function Tests: No results for input(s): TSH, T4TOTAL, FREET4, T3FREE, THYROIDAB in the last 72 hours. Anemia Panel: No results for input(s): VITAMINB12, FOLATE, FERRITIN, TIBC, IRON, RETICCTPCT in the last 72 hours. Sepsis Labs: No results for input(s): PROCALCITON, LATICACIDVEN in the last 168 hours.   No results found for this or any previous visit (from the past 240 hours).        Radiology Studies: No results found.  Scheduled Meds:  amLODipine   5 mg Oral Daily   apixaban   5 mg Oral BID   Chlorhexidine  Gluconate Cloth  6 each Topical Daily   dexamethasone   4 mg Oral Q12H   feeding supplement  237 mL Oral TID BM   insulin  aspart  0-5 Units Subcutaneous QHS   insulin  aspart  0-9 Units Subcutaneous TID WC   mirtazapine   15 mg Oral QHS   multivitamin with minerals  1 tablet Oral Daily   OLANZapine   5 mg Oral QHS   sodium chloride  flush  10-40 mL Intracatheter Q12H   traZODone   50 mg Oral QHS   Continuous Infusions:       LOS: 10 days       Anthony CHRISTELLA Pouch, MD [facility] Pager [phone]  If 7PM-7AM, please contact night-coverage [email or web address] 11/02/2024, 9:16 AM   "

## 2024-11-02 NOTE — Progress Notes (Addendum)
 "                                                                                                                                                                                                          Daily Progress Note   Patient Name: Doris Johnson       Date: 11/02/2024 DOB: 1951-07-06  Age: 73 y.o. MRN#: [id number] Attending Physician: Trudy Anthony HERO, MD Primary Care Physician: Epifanio Alm [name], MD Admit Date: 10/22/2024  Reason for Consultation/Follow-up: Establishing goals of care  HPI/Brief Hospital Review: 73 y.o. female  with past medical history of lung cancer with diffuse metastasis, non-insulin -dependent diabetes mellitus, hypertension, hypothyroidism admitted on 10/22/2024 with AMS and lethargy.   As per chart review, pt presented to ED for evaluation of decreased urine output, lethargy, and altered mentation over the past several days PTA.    Workup revealed blood cultures positive for E. Coli bacteremia. Pt is being treated for E.coli bacteremia, decreased urine output and oral intake, acute metabolic encephalopathy, hypernatremia, and (suspected) severe protein calorie malnutrition.   Palliative Medicine consulted for assisting with goals of care conversations.  Subjective: Extensive chart review has been completed prior to meeting patient including labs, vital signs, imaging, progress notes, orders, and available advanced directive documents from current and previous encounters.    Visited with Doris Johnson at her bedside. She is resting in bed with eyes closed, delayed response of opening her eyes when calling her name. She attempts to nod her head to simple questions but easily drifts back to sleep without constant redirection. No family or visitors at bedside during time of visit.  Spoke with Dr. Janus, daughter has not returned phone calls for three days. Doris Johnson overall prognosis remains poor. [facility] oncology recommended high dose steroids for  possible neurotoxicity from immunotherapy---has completed 2 days of high dose steroids without improvement. Concern remains patient not stable for home with home health. Medical team recommending consideration of hospice services. Shared with Dr. Trudy multiple providers from PMT have had conversations with daughter-Monica and she is not interested in hospice services at this time, wishes to continue with aggressive treatments and interventions. Plan at this time to continue with current plan of care and await stability for discharge home or unfortunately further decline or decompensation. Doris Johnson remains unable to participate in goals of care conversations.  Call placed to [facility], unanswered, HIPAA complaint VM left.  PMT to step away from daily visits, providers will follow peripherally and engage once contact made with daughter and open to continued GOC conversations.  Please reach out for needs or concerns as they arise.  Addendum: Call back received from daughter-Monica. Discussed overall concern for poor prognosis and lack of significant improvement in Doris Johnson's mental status despite high dose steroids as recommended by oncology as well as treatment for UTI. Odella shares on her visit Thursday she felt as though Doris Johnson was able to recognize family. Odella shares in her discussion with oncology she was made aware Doris Johnson's cancer was responding well to treatment. At this time, Odella would like to continue with current plan of care and remains hopeful for recovery. Primary team updated. PMT will continue to step away from daily visits as goals remain clear.  Objective:  Physical Exam Constitutional:      General: She is not in acute distress.    Appearance: She is ill-appearing.     Comments: Frail  HENT:     Head: Normocephalic.     Mouth/Throat:     Mouth: Mucous membranes are dry.  Pulmonary:     Effort: Pulmonary effort is normal. No respiratory distress.  Abdominal:      General: There is no distension.     Palpations: Abdomen is soft.  Skin:    General: Skin is warm and dry.  Neurological:     Mental Status: She is easily aroused. She is lethargic.     Motor: Weakness present.  Psychiatric:        Behavior: Behavior is withdrawn.             Vital Signs: BP (!) 100/56 (BP Location: Left Arm)   Pulse (!) 42   Temp 97.6 F (36.4 C)   Resp 12   Ht 5' 3 (1.6 m)   Wt 59.2 kg   SpO2 92%   BMI 23.12 kg/m  SpO2: SpO2: 92 % O2 Device: O2 Device: Room Air O2 Flow Rate:     Palliative Care Assessment & Plan   Assessment/Recommendation/Plan  Continue with current plan of care PMT to reengage once daughter has made contact and open to further GOC discussions--PMT will continue to follow peripherally  Care plan was discussed with primary team.  Thank you for allowing the Palliative Medicine Team to assist in the care of this patient.  Visit includes: Detailed review of medical records (labs, imaging, vital signs), medically appropriate exam (mental status, respiratory, cardiac, skin), discussed with treatment team, counseling and educating patient, family and staff, documenting clinical information, medication management and coordination of care.  Waddell Lesches, DNP, AGNP-C Palliative Medicine   Please contact Palliative Medicine Team phone at [phone] for questions and concerns.   "

## 2024-11-02 NOTE — Plan of Care (Signed)

## 2024-11-03 DIAGNOSIS — R4182 Altered mental status, unspecified: Secondary | ICD-10-CM | POA: Diagnosis not present

## 2024-11-03 LAB — GLUCOSE, CAPILLARY
Glucose-Capillary: 109 mg/dL — ABNORMAL HIGH (ref 70–99)
Glucose-Capillary: 107 mg/dL — ABNORMAL HIGH (ref 70–99)
Glucose-Capillary: 92 mg/dL (ref 70–99)
Glucose-Capillary: 99 mg/dL (ref 70–99)
Glucose-Capillary: 88 mg/dL (ref 70–99)

## 2024-11-03 LAB — CBC
HCT: 31.2 % — ABNORMAL LOW (ref 36.0–46.0)
Hemoglobin: 10.3 g/dL — ABNORMAL LOW (ref 12.0–15.0)
MCH: 29.9 pg (ref 26.0–34.0)
MCHC: 33 g/dL (ref 30.0–36.0)
MCV: 90.7 fL (ref 80.0–100.0)
Platelets: 236 K/uL (ref 150–400)
RBC: 3.44 MIL/uL — ABNORMAL LOW (ref 3.87–5.11)
RDW: 17.7 % — ABNORMAL HIGH (ref 11.5–15.5)
WBC: 6.4 K/uL (ref 4.0–10.5)
nRBC: 0 % (ref 0.0–0.2)

## 2024-11-03 LAB — PHOSPHORUS: Phosphorus: 2.1 mg/dL — ABNORMAL LOW (ref 2.5–4.6)

## 2024-11-03 NOTE — TOC CM/SW Note (Signed)
 Patient is not able to walk the distance required to go the bathroom, or he/she is unable to safely negotiate stairs required to access the bathroom.  A 3in1 BSC will alleviate this problem

## 2024-11-03 NOTE — TOC CM/SW Note (Signed)
" °  °  Durable Medical Equipment  (From admission, onward)           Start     Ordered   11/03/24 1500  For home use only DME Bedside commode  Once       Question:  Patient needs a bedside commode to treat with the following condition  Answer:  Metastatic cancer (HCC)   11/03/24 1459   11/03/24 1500  For home use only DME Hospital bed  Once       Question Answer Comment  Length of Need Lifetime   Bed type Semi-electric      11/03/24 1459   11/03/24 1500  For home use only DME lightweight manual wheelchair with seat cushion  Once       Comments: Patient suffers from metastatic cancer which impairs their ability to perform daily activities like bathing, dressing, feeding, grooming, and toileting in the home.  A cane, crutch, or walker will not resolve  issue with performing activities of daily living. A wheelchair will allow patient to safely perform daily activities. Patient is not able to propel themselves in the home using a standard weight wheelchair due to arm weakness, endurance, and general weakness. Patient can self propel in the lightweight wheelchair. Length of need Lifetime. Accessories: elevating leg rests (ELRs), wheel locks, extensions and anti-tippers.   11/03/24 1459   10/27/24 1310  For home use only DME Hospital bed  Once       Question Answer Comment  Length of Need 12 Months   Patient has (list medical condition): stage IV lung cancer w/ mets to brain   Bed type Semi-electric      10/27/24 1309            "

## 2024-11-03 NOTE — TOC Progression Note (Signed)
 Transition of Care [facility]) - Progression Note    Patient Details  Name: Doris Johnson MRN: [id number] Date of Birth: Feb 17, 1951  Transition of Care [facility]) CM/SW Contact  [name], RN Phone Number: 11/03/2024, 3:17 PM  Clinical Narrative:     2:59 PM: RNCM Received a message via secure chat from the MD informing me that he is planning on discharging the patient tomorrow, and the patient needs hospital bed and wheelchair and bedside commode.Email sent to [employer] for DME orders. RNCM will continue to follow for discharge planning needs.      Barriers to Discharge: Continued Medical Work up               Expected Discharge Plan and Services       Living arrangements for the past 2 months: Apartment                                       Social Drivers of Health (SDOH) Interventions SDOH Screenings   Food Insecurity: No Food Insecurity (08/16/2024)   Received from [facility]  Housing: Low Risk  (08/16/2024)   Received from [facility]  Transportation Needs: No Transportation Needs (08/16/2024)   Received from [facility]  Utilities: Not At Risk (08/16/2024)   Received from [facility]  Depression [phone]): Low Risk (07/02/2024)  Financial Resource Strain: Low Risk  (08/16/2024)   Received from [facility]  Tobacco Use: Low Risk (10/25/2024)  Recent Concern: Tobacco Use - Medium Risk (10/07/2024)   Received from [facility]    Readmission Risk Interventions    10/25/2024    9:42 AM  Readmission Risk Prevention Plan  Transportation Screening Complete  PCP or Specialist Appt within 3-5 Days Complete  Palliative Care Screening Complete  Medication Review (RN Care Manager) Complete

## 2024-11-03 NOTE — Progress Notes (Addendum)
 " PROGRESS NOTE   HPI was taken from Dr. Jhonny: Doris Johnson is a 73 y.o. female with medical history significant of lung cancer with diffuse metastasis, non-insulin -dependent diabetes mellitus, hypertension, hypothyroidism who presents to the ED for evaluation of decreased urinary output, lethargy, altered mentation over the past several days.  Patient was brought in by family.   Unable to provide history.  History obtained by speaking with the ED provider and reading medical record..  Per medical record EMS reports decreased movement and loss of appetite for 2 days.  Diaper at home has been only been changed once with no urinary output.  Known lung cancer with diffuse metastasis including cerebral metastasis gets treatment every 2 weeks at [facility] oncology.  Has not gone this week.   On presentation patient is ill-appearing.  She is very lethargic.  She is oriented to person.  Does not provide extensive history.  No family at bedside at time of my evaluation.  Vital signs stable.   ED Course: Treated for undifferentiated sepsis.  Patient was given broad-spectrum IV antibiotics and a bolus of intravenous fluids.  Hospitalist contacted for admission   SHARNELL KNIGHT  FMW:[id number] DOB: 1951-09-02 DOA: 10/22/2024 PCP: Epifanio Alm [name], MD   Assessment & Plan:   Principal Problem:   Altered mental status Active Problems:   Protein-calorie malnutrition, severe   Hypophosphatemia   Pressure injury of skin   Hypercholesteremia   Metastasis to brain (HCC)   Sepsis secondary to UTI (HCC)   Failure to thrive in adult   Small cell lung cancer, left (HCC)   Acute encephalopathy  Assessment and Plan: Acute encephalopathy: likely secondary to UTI, hypernatremia and brain mets from lung cancer. Mental status has not improved much. Re-oriented prn. Talked w/ pt's oncologist at [facility] on 10/28/24 who recommended started IV decadron  10mg  q8hr x 2 days in hopes to improve poor mental status  possibly secondary to neurotoxicity from immunotherapy pt has been receiving for lung cancer but minimal to improvement so far. Completed 2 days of high dose IV decadron . Continue on home dose of decadron    E coli uti and e coli bacteremia: s/p 7 days ceftriaxone , resolved. Has port left chest will touch base w/ ID today  Hypernatremia: resolved   Severe protein calorie malnutrition: poor po intake. Encourage po intake    Pressure injury of skin: continue w/ supportive care  Normocytic anemia: WBC and platelets are now both WNL.  No need for a transfusion currently    Failure to thrive in adult: continue w/ supportive care. Palliative care following and recs apprec    Sepsis: see Dr. Stacy note on how pt met sepsis criteria. Secondary to UTI. Completed abx course. Resolved   Lung cancer: w/ mets to the brain. Overall prognosis is poor. Onco recs apprec. Palliative care following and recs apprec, full scope of treatment advised  Disposition: prior provider says unable to contact family but I easily reached them today, they are agreeable to discharge, just needs hospital bed, wheelchair, and bedside commode, will touch base with toc and order those, plan for d/c tomorrow    DVT prophylaxis: eliquis  Code Status: full  Family Communication: daughter 12/21 Disposition Plan: likely d/c back home w/ HH  Level of care: Telemetry  Status is: Inpatient Remains inpatient appropriate because: making discharge plans    Consultants:  Palliative care  Procedures:   Antimicrobials:   Subjective: No complaints  Objective: Vitals:   11/02/24 2055 11/03/24 0500 11/03/24 9441  11/03/24 0737  BP: 115/67  104/76 131/70  Pulse: (!) 42  (!) 58 (!) 45  Resp:   16 14  Temp: 97.8 F (36.6 C)  (!) 97.4 F (36.3 C) 97.6 F (36.4 C)  TempSrc: Oral     SpO2: 100%     Weight:  60.1 kg    Height:        Intake/Output Summary (Last 24 hours) at 11/03/2024 1500 Last data filed at 11/03/2024  1001 Gross per 24 hour  Intake 170 ml  Output 300 ml  Net -130 ml   Filed Weights   11/01/24 0500 11/02/24 0500 11/03/24 0500  Weight: 63.6 kg 59.2 kg 60.1 kg    Examination:  General exam: appears older than stated age. Poor dentition. frail Respiratory system: decreased breath sounds b/l  Cardiovascular system: S1 & S2+. No rubs or clicks  Gastrointestinal system: abd is soft, NT  Central nervous system: alert & awake moving all 4 Psychiatry: judgement and insight appears poor. Flat mood and affect    Data Reviewed: I have personally reviewed following labs and imaging studies  CBC: Recent Labs  Lab 10/28/24 0431 10/29/24 1021 10/30/24 0537 11/01/24 0500 11/03/24 0500  WBC 3.7* 5.6 6.4 7.4 6.4  HGB 10.1* 10.3* 9.6* 9.6* 10.3*  HCT 29.4* 30.4* 28.4* 29.0* 31.2*  MCV 87.8 87.6 89.6 90.9 90.7  PLT 103* 142* 133* 195 236   Basic Metabolic Panel: Recent Labs  Lab 10/27/24 1724 10/28/24 0431 10/29/24 1021 10/30/24 0537 10/31/24 1036 11/01/24 0500 11/03/24 0500  NA 127* 143 140  --  140  --   --   K  --  3.2* 4.7  --  3.6  --   --   CL  --  107 108  --  109  --   --   CO2  --  23 24  --  24  --   --   GLUCOSE  --  119* 129*  --  126*  --   --   BUN  --  11 12  --  23  --   --   CREATININE  --  0.39* 0.61  --  0.49  --   --   CALCIUM  --  9.3 9.5  --  9.0  --   --   MG  --  1.9  --  2.1  --   --   --   PHOS  --  2.4*  --  2.1*  --  2.3* 2.1*   GFR: Estimated Creatinine Clearance: 51 mL/min (by C-G formula based on SCr of 0.49 mg/dL). Liver Function Tests: No results for input(s): AST, ALT, ALKPHOS, BILITOT, PROT, ALBUMIN in the last 168 hours.  No results for input(s): LIPASE, AMYLASE in the last 168 hours. No results for input(s): AMMONIA in the last 168 hours. Coagulation Profile: No results for input(s): INR, PROTIME in the last 168 hours.  Cardiac Enzymes: No results for input(s): CKTOTAL, CKMB, CKMBINDEX, TROPONINI in  the last 168 hours. BNP (last 3 results) No results for input(s): PROBNP in the last 8760 hours. HbA1C: No results for input(s): HGBA1C in the last 72 hours. CBG: Recent Labs  Lab 11/02/24 1653 11/02/24 2057 11/03/24 0455 11/03/24 0738 11/03/24 1131  GLUCAP 87 75 109* 107* 92   Lipid Profile: No results for input(s): CHOL, HDL, LDLCALC, TRIG, CHOLHDL, LDLDIRECT in the last 72 hours. Thyroid  Function Tests: No results for input(s): TSH, T4TOTAL, FREET4, T3FREE, THYROIDAB in the last  72 hours. Anemia Panel: No results for input(s): VITAMINB12, FOLATE, FERRITIN, TIBC, IRON, RETICCTPCT in the last 72 hours. Sepsis Labs: No results for input(s): PROCALCITON, LATICACIDVEN in the last 168 hours.   No results found for this or any previous visit (from the past 240 hours).        Radiology Studies: No results found.       Scheduled Meds:  amLODipine   5 mg Oral Daily   apixaban   5 mg Oral BID   Chlorhexidine  Gluconate Cloth  6 each Topical Daily   dexamethasone   4 mg Oral Q12H   feeding supplement  237 mL Oral TID BM   insulin  aspart  0-5 Units Subcutaneous QHS   insulin  aspart  0-9 Units Subcutaneous TID WC   mirtazapine   15 mg Oral QHS   multivitamin with minerals  1 tablet Oral Daily   OLANZapine   5 mg Oral QHS   sodium chloride  flush  10-40 mL Intracatheter Q12H   traZODone   50 mg Oral QHS   Continuous Infusions:       LOS: 11 days       Devaughn KATHEE Ban, MD [facility]   If 7PM-7AM, please contact night-coverage [email or web address] 11/03/2024, 3:00 PM   "

## 2024-11-03 NOTE — Plan of Care (Signed)

## 2024-11-04 DIAGNOSIS — R4182 Altered mental status, unspecified: Secondary | ICD-10-CM | POA: Diagnosis not present

## 2024-11-04 DIAGNOSIS — B9620 Unspecified Escherichia coli [E. coli] as the cause of diseases classified elsewhere: Secondary | ICD-10-CM

## 2024-11-04 LAB — GLUCOSE, CAPILLARY
Glucose-Capillary: 93 mg/dL (ref 70–99)
Glucose-Capillary: 87 mg/dL (ref 70–99)

## 2024-11-04 MED ORDER — HEPARIN SOD (PORK) LOCK FLUSH 100 UNIT/ML IV SOLN
500.0000 [IU] | INTRAVENOUS | Status: AC | PRN
  Administered 2024-11-04: 500 [IU]

## 2024-11-04 NOTE — Progress Notes (Signed)
 [facility] here to transport patient home, spoke with daughter Odella concerning wheelchair and [facility] still not being picked up, Odella states that someone will come later this evening or tomorrow for the DME.

## 2024-11-04 NOTE — Progress Notes (Signed)
 OT Cancellation Note  Patient Details Name: Doris Johnson MRN: [id number] DOB: 10/07/1951   Cancelled Treatment:    Reason Eval/Treat Not Completed: Other (comment). Chart reviewed - pt to discharge home today with hospital bed, hoyer lift, wheelchair and BSC. Recommend HH OT to follow up at discharge.   Phil Corti L. Teofil Maniaci, OTR/L  11/04/2024, 1:18 PM

## 2024-11-04 NOTE — Discharge Summary (Signed)
 Doris Johnson FMW:[id number] DOB: 1950-12-23 DOA: 10/22/2024  PCP: Epifanio Alm [name], MD  Admit date: 10/22/2024 Discharge date: 11/04/2024  Time spent: 35 minutes  Recommendations for Outpatient Follow-up:  [facility] f/u     Discharge Diagnoses:  Principal Problem:   Altered mental status Active Problems:   Protein-calorie malnutrition, severe   Hypophosphatemia   Pressure injury of skin   Hypercholesteremia   Metastasis to brain (HCC)   Sepsis secondary to UTI (HCC)   Failure to thrive in adult   Small cell lung cancer, left (HCC)   Acute encephalopathy   Discharge Condition: stable  Diet recommendation: dysphagia 2  Filed Weights   11/02/24 0500 11/03/24 0500 11/04/24 0500  Weight: 59.2 kg 60.1 kg 60.1 kg    History of present illness:  From admission h and p Doris Johnson is a 73 y.o. female with medical history significant of lung cancer with diffuse metastasis, non-insulin -dependent diabetes mellitus, hypertension, hypothyroidism who presents to the ED for evaluation of decreased urinary output, lethargy, altered mentation over the past several days.  Patient was brought in by family.   Unable to provide history.  History obtained by speaking with the ED provider and reading medical record..  Per medical record EMS reports decreased movement and loss of appetite for 2 days.  Diaper at home has been only been changed once with no urinary output.  Known lung cancer with diffuse metastasis including cerebral metastasis gets treatment every 2 weeks at [facility] oncology.  Has not gone this week.   On presentation patient is ill-appearing.  She is very lethargic.  She is oriented to person.  Does not provide extensive history.  No family at bedside at time of my evaluation.  Vital signs stable.  Hospital Course:   Patient presents with acute encephalopathy. Likely secondary to e coli bacteremia/uti which were treated with a course of abx (ID did not advise  removing her chest port). Brain mets likely contributory, patient received IV decadron  10 mg q8 for 2 days. Mental status now appears to be back to baseline. Also with failure to thrive, severe malnutrition, sacral decubitus ulcers (poa). Prognosis and life expectancy are poor but for now daughter desires full code and full scope of care. Discharging home with hospital bed, hoyer lift, wheelchair, and bedside commode, with plan to f/u with her [facility] team.   Procedures: none   Consultations: none  Discharge Exam: Vitals:   11/04/24 0442 11/04/24 0801  BP: 102/64 108/81  Pulse: (!) 42 (!) 45  Resp: 16 16  Temp: 98.2 F (36.8 C) (!) 97.4 F (36.3 C)  SpO2: 100% 92%    General: cachectic, chronically ill appearing Cardiovascular: rrr Respiratory: ctab Ext: warm  Discharge Instructions   Discharge Instructions     Diet general   Complete by: As directed    Discharge wound care:   Complete by: As directed    See instructions   Increase activity slowly   Complete by: As directed       Allergies as of 11/04/2024   No Known Allergies      Medication List     STOP taking these medications    furosemide  20 MG tablet Commonly known as: LASIX    losartan 25 MG tablet Commonly known as: COZAAR   metFORMIN 500 MG 24 hr tablet Commonly known as: GLUCOPHAGE-XR   potassium chloride  SA 20 MEQ tablet Commonly known as: KLOR-CON  M       TAKE these medications  amLODipine  5 MG tablet Commonly known as: NORVASC  Take 5 mg by mouth daily.   apixaban  5 MG Tabs tablet Commonly known as: Eliquis  Take 1 tablet (5 mg total) by mouth 2 (two) times daily.   dexamethasone  4 MG tablet Commonly known as: DECADRON  Take 4 mg by mouth.   fluticasone  50 MCG/ACT nasal spray Commonly known as: FLONASE  Place 2 sprays into both nostrils daily.   lidocaine -prilocaine  cream Commonly known as: EMLA  Apply 1 Application topically as needed. Apply small amount to port  site at least 1 hour prior to it being accessed, cover with plastic wrap   mirtazapine  15 MG disintegrating tablet Commonly known as: REMERON  SOL-TAB Take 15 mg by mouth at bedtime.   nystatin 100000 UNIT/ML suspension Commonly known as: MYCOSTATIN Take by mouth.   OLANZapine  5 MG tablet Commonly known as: ZYPREXA  Take 5 mg by mouth at bedtime.   traZODone  50 MG tablet Commonly known as: DESYREL  Take 50 mg by mouth at bedtime.               Durable Medical Equipment  (From admission, onward)           Start     Ordered   11/04/24 0951  For home use only DME Other see comment  Once       Comments: Deitra lift  Question:  Length of Need  Answer:  Lifetime   11/04/24 0951   11/03/24 1500  For home use only DME Bedside commode  Once       Question:  Patient needs a bedside commode to treat with the following condition  Answer:  Metastatic cancer (HCC)   11/03/24 1459   11/03/24 1500  For home use only DME Hospital bed  Once       Question Answer Comment  Length of Need Lifetime   Bed type Semi-electric      11/03/24 1459   11/03/24 1500  For home use only DME lightweight manual wheelchair with seat cushion  Once       Comments: Patient suffers from metastatic cancer which impairs their ability to perform daily activities like bathing, dressing, feeding, grooming, and toileting in the home.  A cane, crutch, or walker will not resolve  issue with performing activities of daily living. A wheelchair will allow patient to safely perform daily activities. Patient is not able to propel themselves in the home using a standard weight wheelchair due to arm weakness, endurance, and general weakness. Patient can self propel in the lightweight wheelchair. Length of need Lifetime. Accessories: elevating leg rests (ELRs), wheel locks, extensions and anti-tippers.   11/03/24 1459   10/27/24 1310  For home use only DME Hospital bed  Once       Question Answer Comment  Length of Need  12 Months   Patient has (list medical condition): stage IV lung cancer w/ mets to brain   Bed type Semi-electric      10/27/24 1309              Discharge Care Instructions  (From admission, onward)           Start     Ordered   11/04/24 0000  Discharge wound care:       Comments: See instructions   11/04/24 1547           Allergies[1]  Contact information for follow-up providers     your [facility] oncology team Follow up.  Contact information for after-discharge care     Home Medical Care     [facility]) .   Service: Home Health Services Contact information: [address] [place]   [id number] [phone]                      The results of significant diagnostics from this hospitalization (including imaging, microbiology, ancillary and laboratory) are listed below for reference.    Significant Diagnostic Studies: MR BRAIN W WO CONTRAST Result Date: 10/26/2024 EXAM: MRI BRAIN WITH AND WITHOUT CONTRAST 10/26/2024 07:20:10 PM TECHNIQUE: Multiplanar multisequence MRI of the head/brain was performed with and without the administration of intravenous contrast. CONTRAST: 6 mL of Gadobutrol . COMPARISON: MR Head Wo/w Cm 06/28/2024. CLINICAL HISTORY: Altered mental status change, presumed secondary to urinary tract infection. Acute neuro deficit, stroke suspected. Striae of brain metastasis. Sepsis criteria with tachycardia, leukocytosis, elevated lactic. FINDINGS: BRAIN AND VENTRICLES: Diffuse confluent hyperintense T2-weighted signal within the bilateral cerebral white matter. Multifocal chronic hemorrhage associated with previously treated metastases. There are 5 intraparenchymal metastases visible on the current study. The number is greatly reduced from the prior examination, although there are technical differences between the 2 scans that may hide some of the smaller lesions on the  current study. The lesions are best seen on the postcontrast sagittal sequence. The right temporal lesion has decreased in size (image 5). The posterior right parietal lesion has decreased in size (image 6). The superior right cerebellar lesion has slightly decreased in size (image 10). The posterior right parietal lesion has decreased in size (paren image 12). The superior left cerebellar lesion has slightly decreased in size (image 19). No acute infarct. No acute intracranial hemorrhage. No mass effect or midline shift. No hydrocephalus. The sella is unremarkable. Normal flow voids. ORBITS: No acute abnormality. SINUSES: Right mastoid effusion. BONES AND SOFT TISSUES: Normal bone marrow signal and enhancement. No acute soft tissue abnormality. IMPRESSION: 1. No acute intracranial abnormality 2. Five intraparenchymal metastases, decreased in number and size from the prior exam, acknowledging technical differences that may obscure smaller lesions. 3. Diffuse confluent T2 hyperintensity in the bilateral cerebral white matter, likely a sequela of radiation treatment. Electronically signed by: Franky Stanford MD 10/26/2024 11:55 PM EST RP Workstation: [id number]   DG Abd Portable 1V Result Date: 10/25/2024 EXAM: 1 VIEW XRAY OF THE ABDOMEN 10/25/2024 09:43:03 PM COMPARISON: None available. CLINICAL HISTORY: Mental status change, unknown cause; Neuro deficit, acute, stroke suspected; [id number] Encounter for imaging to screen for metal prior to magnetic resonance imaging (MRI) [id number]; Striae of brain metastasis FINDINGS: BOWEL: Nonobstructive bowel gas pattern. SOFT TISSUES: No radiopaque foreign body is seen. No abnormal calcifications. BONES: No acute fracture. IMPRESSION: 1. No radiopaque foreign body. Electronically signed by: Pinkie Pebbles MD 10/25/2024 09:46 PM EST RP Workstation: [id number]   CT Head Wo Contrast Result Date: 10/22/2024 CLINICAL DATA:  Altered mental status.  Metastatic lung carcinoma. EXAM: CT  HEAD WITHOUT CONTRAST TECHNIQUE: Contiguous axial images were obtained from the base of the skull through the vertex without intravenous contrast. RADIATION DOSE REDUCTION: This exam was performed according to the departmental dose-optimization program which includes automated exposure control, adjustment of the mA and/or kV according to patient size and/or use of iterative reconstruction technique. COMPARISON:  Head MRI on 06/28/2024 FINDINGS: Brain: No evidence of intracranial hemorrhage, acute infarction, hydrocephalus, or extra-axial collection. Mild vasogenic edema is again seen in the high right parietal lobe at site of  metastasis visualized on prior MRI. No evidence of mass effect or midline shift. Stable mild diffuse cerebral atrophy and moderate chronic small vessel disease. Vascular:  No hyperdense vessel or other acute findings. Skull: No evidence of fracture or other significant bone abnormality. Sinuses/Orbits:  Right mastoid effusion again noted. Other: None. IMPRESSION: No significant change in mild vasogenic edema in the high right parietal lobe at site of metastasis visualized on prior MRI. No evidence of intracranial hemorrhage, mass effect or midline shift. Stable mild cerebral atrophy and moderate chronic small vessel disease. Stable right mastoid effusion. Electronically Signed   By: Norleen DELENA Kil M.D.   On: 10/22/2024 14:40   DG Chest Port 1 View if patient is in a treatment room. Result Date: 10/22/2024 CLINICAL DATA:  Suspected Sepsis EXAM: PORTABLE CHEST - 1 VIEW COMPARISON:  CT chest abdomen pelvis 06/27/2024 and chest radiograph 10/21/2020 FINDINGS: Cardiomediastinal silhouette and pulmonary vasculature are within normal limits. Posttreatment changes again seen in the LEFT upper medial lung. Lungs otherwise clear. RIGHT IJ chest port terminates in the region of the superior vena cava. IMPRESSION: No acute cardiopulmonary process. Electronically Signed   By: Aliene Lloyd M.D.   On:  10/22/2024 14:10    Microbiology: No results found for this or any previous visit (from the past 240 hours).   Labs: Basic Metabolic Panel: Recent Labs  Lab 10/29/24 1021 10/30/24 0537 10/31/24 1036 11/01/24 0500 11/03/24 0500  NA 140  --  140  --   --   K 4.7  --  3.6  --   --   CL 108  --  109  --   --   CO2 24  --  24  --   --   GLUCOSE 129*  --  126*  --   --   BUN 12  --  23  --   --   CREATININE 0.61  --  0.49  --   --   CALCIUM 9.5  --  9.0  --   --   MG  --  2.1  --   --   --   PHOS  --  2.1*  --  2.3* 2.1*   Liver Function Tests: No results for input(s): AST, ALT, ALKPHOS, BILITOT, PROT, ALBUMIN in the last 168 hours. No results for input(s): LIPASE, AMYLASE in the last 168 hours. No results for input(s): AMMONIA in the last 168 hours. CBC: Recent Labs  Lab 10/29/24 1021 10/30/24 0537 11/01/24 0500 11/03/24 0500  WBC 5.6 6.4 7.4 6.4  HGB 10.3* 9.6* 9.6* 10.3*  HCT 30.4* 28.4* 29.0* 31.2*  MCV 87.6 89.6 90.9 90.7  PLT 142* 133* 195 236   Cardiac Enzymes: No results for input(s): CKTOTAL, CKMB, CKMBINDEX, TROPONINI in the last 168 hours. BNP: BNP (last 3 results) No results for input(s): BNP in the last 8760 hours.  ProBNP (last 3 results) No results for input(s): PROBNP in the last 8760 hours.  CBG: Recent Labs  Lab 11/03/24 1131 11/03/24 1656 11/03/24 2021 11/04/24 0755 11/04/24 1129  GLUCAP 92 99 88 93 87       Signed:  Devaughn KATHEE Ban MD.  [facility] 11/04/2024, 3:52 PM     [1] No Known Allergies

## 2024-11-04 NOTE — TOC Transition Note (Signed)
 Transition of Care [facility]) - Discharge Note   Patient Details  Name: Doris Johnson MRN: [id number] Date of Birth: 1951/07/18  Transition of Care [facility]) CM/SW Contact:  Shasta DELENA Daring, RN Phone Number: 11/04/2024, 3:36 PM   Clinical Narrative:    Patient has discharge orders.  Confirmed with  Mitch[facility] [employer] that DME has been delivered to the home. Notified [facility] of discharge. Arranged transport via [facility]. Left message for Odella that patient transport has been arranged with [facility] and that she is 7th on the list for transport.  No additional TOC needs identified. RNCM signing off.     Final next level of care: Home w Home Health Services Barriers to Discharge: Barriers Resolved   Patient Goals and CMS Choice            Discharge Placement                Patient to be transferred to facility by: [facility] Name of family member notified: [facility] Patient and family notified of of transfer: 11/04/24  Discharge Plan and Services Additional resources added to the After Visit Summary for                    DME Agency: [employer] Date DME Agency Contacted: 11/04/24 Time DME Agency Contacted: 1535 Representative spoke with at DME Agency: [facility] HH Arranged: RN HH Agency: [facility] Date [facility] Agency Contacted: 11/04/24 Time HH Agency Contacted: 1535 Representative spoke with at [facility] Agency: Company Secretary  Social Drivers of Health (SDOH) Interventions SDOH Screenings   Food Insecurity: No Food Insecurity (08/16/2024)   Received from [facility]  Housing: Low Risk  (08/16/2024)   Received from [facility]  Transportation Needs: No Transportation Needs (08/16/2024)   Received from [facility]  Utilities: Not At Risk (08/16/2024)   Received from [facility]  Depression [phone]): Low Risk (07/02/2024)  Financial Resource Strain: Low Risk  (08/16/2024)   Received from [facility]   Tobacco Use: Low Risk (10/25/2024)  Recent Concern: Tobacco Use - Medium Risk (10/07/2024)   Received from [facility]     Readmission Risk Interventions    10/25/2024    9:42 AM  Readmission Risk Prevention Plan  Transportation Screening Complete  PCP or Specialist Appt within 3-5 Days Complete  Palliative Care Screening Complete  Medication Review (RN Care Manager) Complete

## 2024-11-04 NOTE — Plan of Care (Signed)
" °  Problem: Fluid Volume: Goal: Hemodynamic stability will improve Outcome: Adequate for Discharge   Problem: Clinical Measurements: Goal: Diagnostic test results will improve Outcome: Adequate for Discharge Goal: Signs and symptoms of infection will decrease Outcome: Adequate for Discharge   Problem: Respiratory: Goal: Ability to maintain adequate ventilation will improve Outcome: Adequate for Discharge   Problem: Education: Goal: Knowledge of General Education information will improve Description: Including pain rating scale, medication(s)/side effects and non-pharmacologic comfort measures Outcome: Adequate for Discharge   Problem: Health Behavior/Discharge Planning: Goal: Ability to manage health-related needs will improve Outcome: Adequate for Discharge   Problem: Clinical Measurements: Goal: Ability to maintain clinical measurements within normal limits will improve Outcome: Adequate for Discharge Goal: Will remain free from infection Outcome: Adequate for Discharge Goal: Diagnostic test results will improve Outcome: Adequate for Discharge Goal: Respiratory complications will improve Outcome: Adequate for Discharge Goal: Cardiovascular complication will be avoided Outcome: Adequate for Discharge   Problem: Activity: Goal: Risk for activity intolerance will decrease Outcome: Adequate for Discharge   Problem: Nutrition: Goal: Adequate nutrition will be maintained Outcome: Adequate for Discharge   Problem: Coping: Goal: Level of anxiety will decrease Outcome: Adequate for Discharge   Problem: Elimination: Goal: Will not experience complications related to bowel motility Outcome: Adequate for Discharge Goal: Will not experience complications related to urinary retention Outcome: Adequate for Discharge   Problem: Pain Managment: Goal: General experience of comfort will improve and/or be controlled Outcome: Adequate for Discharge   Problem: Safety: Goal:  Ability to remain free from injury will improve Outcome: Adequate for Discharge   Problem: Skin Integrity: Goal: Risk for impaired skin integrity will decrease Outcome: Adequate for Discharge   Problem: Education: Goal: Ability to describe self-care measures that may prevent or decrease complications (Diabetes Survival Skills Education) will improve Outcome: Adequate for Discharge Goal: Individualized Educational Video(s) Outcome: Adequate for Discharge   Problem: Coping: Goal: Ability to adjust to condition or change in health will improve Outcome: Adequate for Discharge   Problem: Fluid Volume: Goal: Ability to maintain a balanced intake and output will improve Outcome: Adequate for Discharge   Problem: Health Behavior/Discharge Planning: Goal: Ability to identify and utilize available resources and services will improve Outcome: Adequate for Discharge Goal: Ability to manage health-related needs will improve Outcome: Adequate for Discharge   Problem: Metabolic: Goal: Ability to maintain appropriate glucose levels will improve Outcome: Adequate for Discharge   Problem: Nutritional: Goal: Maintenance of adequate nutrition will improve Outcome: Adequate for Discharge Goal: Progress toward achieving an optimal weight will improve Outcome: Adequate for Discharge   Problem: Skin Integrity: Goal: Risk for impaired skin integrity will decrease Outcome: Adequate for Discharge   Problem: Acute Rehab PT Goals(only PT should resolve) Goal: Pt Will Go Supine/Side To Sit Outcome: Adequate for Discharge Goal: Pt Will Go Sit To Supine/Side Outcome: Adequate for Discharge Goal: Pt Will Transfer Bed To Chair/Chair To Bed Outcome: Adequate for Discharge Goal: Pt Will Ambulate Outcome: Adequate for Discharge   Problem: Tissue Perfusion: Goal: Adequacy of tissue perfusion will improve Outcome: Adequate for Discharge   Problem: Malnutrition  (NI-5.2) Goal: Food and/or nutrient  delivery Description: Individualized approach for food/nutrient provision. Outcome: Adequate for Discharge   "

## 2024-11-04 NOTE — TOC Progression Note (Signed)
 Transition of Care [facility]) - Progression Note    Patient Details  Name: Doris Johnson MRN: [id number] Date of Birth: 1951/08/07  [facility]) CM/SW Contact  [name], RN Phone Number: 11/04/2024, 11:55 AM  Clinical Narrative:      BSC and WC were delivered to room Spoke with daughter Odella and she states she will have her ex husband pick them up from the hospital  Per Odella she has not spoken with [facility] about delivery of bed She states she is in agreement to [employer] placed to [place] with [facility].  Notified him of need for hoyer lift, and that anticipated dc today, but that the hospital bed will need to be in the home prior to patient arrival   At discharge will require [facility] transport, and notify [facility] of discharge      Barriers to Discharge: Continued Medical Work up               Expected Discharge Plan and Services       Living arrangements for the past 2 months: Apartment                                       Social Drivers of Health (SDOH) Interventions SDOH Screenings   Food Insecurity: No Food Insecurity (08/16/2024)   Received from [facility]  Housing: Low Risk  (08/16/2024)   Received from [facility]  Transportation Needs: No Transportation Needs (08/16/2024)   Received from [facility]  Utilities: Not At Risk (08/16/2024)   Received from [facility]  Depression [phone]): Low Risk (07/02/2024)  Financial Resource Strain: Low Risk  (08/16/2024)   Received from [facility]  Tobacco Use: Low Risk (10/25/2024)  Recent Concern: Tobacco Use - Medium Risk (10/07/2024)   Received from [facility]    Readmission Risk Interventions    10/25/2024    9:42 AM  Readmission Risk Prevention Plan  Transportation Screening Complete  PCP or Specialist Appt within 3-5 Days Complete  Palliative Care Screening  Complete  Medication Review (RN Care Manager) Complete

## 2024-11-04 NOTE — Plan of Care (Signed)

## 2024-11-05 NOTE — Progress Notes (Signed)
 CARE TEAM  Patient Care Team: Stinchcombe, Debby Done, MD as Consulting Provider (Oncology) Ellena Sanders FNP-BC, AOCNP Nurse Practitioner (Oncology) Geofm Oh, RN as Nurse (Oncology)   Referring Physician: Self No address on file.  Consulting Physicians:  PCP: Epifanio Alm Dover, MD Medical Oncologist:   ONCOLOGY HISTORY  Date of diagnosis: 08/2020 Diagnosis: Small cell lung cancer  Stage: Extensive  Prior systemic therapy: Carboplatin /etoposide  + atezolizumab  Lurbinectedin    Current Treatment: Tarlatamab C1D1 07/29/24  ONCOLOGY HISTORY  I am seeing the patient at the request of Dr. Melanee, an oncologist,  for further evaluation and consideration of treatment options for ES-SCLC. The patient lives in [place]. It has been > 3 years since her last visit to [facility] oncology. The patient underwent imaging in 02/2024 which revealed stable disease outside the brain but CNS disease progression and she was treated with WBRT (second course).    INTERVAL HISTORY  11/05/2024: Doris Johnson is accompanied by her brother, Zachary. She is here for assessment post hospitalization   Admitted 12/09 - 12/22 with AMS, failure to thrive   History of Present Illness Doris Johnson is a 73 year old female with extensive-stage small cell lung cancer with brain metastases who presents for follow-up after recent hospitalization for ulcerative metastasis and functional decline, now transitioning to hospice care.  She has extensive-stage small cell lung cancer with brain metastases, previously treated with carboplatin /etoposide /atezolizumab , lurbinectedin , and most recently tarlatamab. Disease progression in the central nervous system necessitated two courses of whole brain radiation, resulting in persistent cognitive impairment.  She was recently hospitalized for AMS and failure to thrive. Since discharge, she has experienced significant functional decline, characterized by excessive  somnolence, nocturnal wakefulness, and ongoing altered mental status. She continues to have visual hallucinations and episodes of talking to things that are not present. She recognizes her caregiver but remains confused.  During her hospitalization, she was prescribed systemic steroids for management of CNS symptoms and radiation-induced edema. Post-discharge, there was difficulty accessing her medications due to her prescription bag not being returned from the hospital; a new steroid prescription was sent to the pharmacy to ensure continuity of care.  Her caregiver reports concern regarding her overall decline and challenges in symptom management at home.   Assessment/Plan: 73 year-old patient with ES-SCLC Assessment & Plan Extensive stage small cell lung cancer with brain metastases Recurrent, progressive disease despite multiple therapies. Poor prognosis with high risk for further decline. Focus on comfort and symptom management. - Discontinued tarlatamab due to clinical decline and transition to hospice. - No further active oncologic therapy planned. - Transitioned care to comfort measures and symptom management.  Cancer-related cachexia and functional decline Significant cachexia, anorexia, weight loss, and functional decline due to advanced malignancy and prior therapies. - Transitioned care to hospice for supportive management of cachexia and functional decline.  Cognitive impairment after whole brain radiation Persistent cognitive impairment post-radiation, multifactorial etiology. - Transitioned care to hospice for ongoing management and support of cognitive symptoms.  Palliative care and hospice transition Advanced disease and limited benefit from further therapy warrant hospice transition for comfort and symptom management. - Initiated referral to hospice agency. - Provided anticipatory guidance regarding hospice services and support.  Chronic steroid therapy Chronic  corticosteroid therapy for CNS metastases and radiation-induced edema with symptomatic improvement but risk of side effects. - Sent prescription for steroids to pharmacy for continued use. - Continued steroids as needed for CNS symptoms and symptom management. - Hospice and palliative care team to guide ongoing medication management.  HISTORY AND MEDICATIONS   Problem List:  Past Medical History:  Diagnosis Date   Diabetes mellitus without complication (CMS/HHS-HCC)    GERD (gastroesophageal reflux disease)    History of cataract    Hyperlipidemia    Hypertension    Oncology History  Small cell lung cancer (CMS/HHS-HCC)  10/30/2020 Initial Diagnosis   Small cell lung cancer (CMS/HHS-HCC)   07/29/2024 -  Chemotherapy   M A THOR Tarlatamab IV C1: D1, D8, D15; C2+: D1, D15 Q28D Plan Provider: Debby Essie Goad, MD Treatment goal: Palliative Line of treatment: Third Line   Metastatic small cell carcinoma to brain (CMS/HHS-HCC)  06/13/2024 Initial Diagnosis   Metastatic small cell carcinoma to brain (CMS/HHS-HCC)   07/29/2024 -  Chemotherapy   M A THOR Tarlatamab IV C1: D1, D8, D15; C2+: D1, D15 Q28D Plan Provider: Debby Essie Goad, MD Treatment goal: Palliative Line of treatment: Third Line    Past Surgical History:  Procedure Laterality Date   HYSTERECTOMY     Medications: Current Outpatient Medications  Medication Sig Dispense Refill   amLODIPine  (NORVASC ) 5 MG tablet Take 1 tablet (5 mg total) by mouth once daily 30 tablet 1   apixaban  (ELIQUIS ) 5 mg tablet Take 1 tablet (5 mg total) by mouth every 12 (twelve) hours 60 tablet 2   dexAMETHasone  (DECADRON ) 4 MG tablet Take 4 mg by mouth     mirtazapine  (REMERON ) 15 MG tablet Take 1 tablet (15 mg total) by mouth at bedtime 90 tablet 1   OLANZapine  (ZYPREXA ) 5 MG tablet Take 5 mg by mouth at bedtime     potassium chloride  (KLOR-CON  M20) 20 MEQ ER tablet Take 2 tablets (40 mEq total) by  mouth once daily 180 tablet 3   No current facility-administered medications for this visit.    Personal and Social History: Social History   Socioeconomic History   Marital status: Divorced   Number of children: 1   Years of education: 12   Highest education level: High school graduate  Occupational History   Occupation: Retired Catering Manager  Tobacco Use   Smoking status: Former    Types: Cigarettes   Smokeless tobacco: Never   Tobacco comments:    Pt states she smoked<1 year off and on but didn't inhale, quit when she was younger ? 15s.  Vaping Use   Vaping status: Never Used  Substance and Sexual Activity   Alcohol use: Yes   Drug use: Never   Sexual activity: Not Currently   Social Drivers of Health   Financial Resource Strain: Low Risk  (08/16/2024)   Overall Financial Resource Strain (CARDIA)    Difficulty of Paying Living Expenses: Not hard at all  Food Insecurity: No Food Insecurity (08/16/2024)   Hunger Vital Sign    Worried About Running Out of Food in the Last Year: Never true    Ran Out of Food in the Last Year: Never true  Transportation Needs: No Transportation Needs (08/16/2024)   PRAPARE - Administrator, Civil Service (Medical): No    Lack of Transportation (Non-Medical): No  Housing Stability: Low Risk  (08/16/2024)   Housing Stability Vital Sign    Unable to Pay for Housing in the Last Year: No    Number of Times Moved in the Last Year: 0    Homeless in the Last Year: No   Family History:  Family History  Problem Relation Name Age of Onset   Breast cancer Mother  High blood pressure (Hypertension) Mother     Myocardial Infarction (Heart attack) Father     High blood pressure (Hypertension) Father     Hyperlipidemia (Elevated cholesterol) Brother     Diabetes type II Paternal Grandmother     Myocardial Infarction (Heart attack) Brother      ROS: Pertinent positive/negative documented in HPI,  and all other systems reviewed negative   PHYSICAL EXAMINATION   General:  WDWN in NAD There were no vitals filed for this visit.     LABS/STUDIES   Lab Results  Component Value Date   WBC 4.4 10/07/2024   HGB 11.0 (L) 10/07/2024   HCT 32.6 (L) 10/07/2024   MCV 88 10/07/2024   PLT 237 10/07/2024     Chemistry      Component Value Date/Time   NA 142 10/07/2024 0757   K 3.2 (L) 10/07/2024 0757   CL 111 (H) 10/07/2024 0757   CO2 20 (L) 10/07/2024 0757   BUN 15 10/07/2024 0757   CREATININE 0.5 10/07/2024 0757   GLUCOSE 102 10/07/2024 0757      Component Value Date/Time   CALCIUM 10.0 10/07/2024 0757   ALKPHOS 43 10/07/2024 0757   AST 20 10/07/2024 0757   ALT 13 10/07/2024 0757   TBILI 1.2 10/07/2024 0757      RADIOLOGY     PATHOLOGY   Component 1 yr ago  SURGICAL PATHOLOGY SURGICAL PATHOLOGY CASE: [id number] PATIENT: LUCIENNE COLLIER Surgical Pathology Report Specimen Submitted: A. Lymph node, left supraclavicular superior B. Lymph node, left supraclavicular inferior Clinical History: Small cell lung cancer with enlarged left supraclavicular nodes. DIAGNOSIS: A.  LYMPH NODE, LEFT SUPRACLAVICULAR SUPERIOR; ULTRASOUND-GUIDED BIOPSY: - METASTATIC HIGH GRADE NEUROENDOCRINE CARCINOMA, COMPATIBLE WITH PATIENT'S HISTORY OF SMALL CELL NEUROENDOCRINE CARCINOMA OF THE LUNG. B.  LYMPH NODE, LEFT SUPRACLAVICULAR INFERIOR; ULTRASOUND-GUIDED BIOPSY: - METASTATIC HIGH GRADE NEUROENDOCRINE CARCINOMA, COMPATIBLE WITH PATIENT'S HISTORY OF SMALL CELL NEUROENDOCRINE CARCINOMA OF THE LUNG. Comment: The morphology of the carcinoma in the current specimens is similar to that in her previous liver biopsy ([id number]). There is adequate material for ancillary molecular testing (B1, B2, B3, B4). GROSS DESCRIPTION: A. Labeled: Left supraclavicular lymph node superior Received: Fresh on saline soaked gauze Time/date in fixative: 2:37 PM on 07/28/2022 Placed into formalin  time: 2:55 PM on 07/28/2022 Tissue fragment(s): 3 Size: Aggregate, 0.7 x 0.2 x 0.1 cm Description: Received are 2 fragments of blood clot and a single fragment of tan soft tissue. Entirely submitted in 1 cassette. B. Labeled: Left supraclavicular lymph node inferior Received: Fresh on saline soaked gauze Collection time: 2:43 PM on 07/28/2022 Placed into formalin time: 2:55 PM on 07/28/2022 Number of needle core biopsy(s): 5 cores Length: Range from 0.9 to 1.1 cm Diameter: 0.1 cm Description: Received are cores of tan and red soft tissue. Ink: Green Entirely submitted in cassettes 1-5 with 1 core per cassette. RB 07/28/2022 Final Diagnosis performed by Rexene Daily, MD.   Electronically signed 07/29/2022 1:14:14PM The electronic signature indicates that the named Attending Pathologist has evaluated the specimen Technical component performed at [facility], [address] [place], [place] [address] Lab: [phone] Dir: Frankey Sas, MD, MMM  Professional component performed at [facility], [facility], [address] [place], [place], [place] [address] Lab: [phone] Dir: Wilkie EMERSON Molt, MD    MOLECULAR DIAGNOSTICS AND MARKERS   This video encounter was conducted with the patient's (or proxy's) verbal consent via secure, interactive audio and video telecommunications while in clinic/office/hospital.  The patient (or proxy) was  instructed to have this encounter in a suitably private space and to only have persons present to whom they give permission to participate. In addition, patient identity was confirmed by use of name plus an additional identifier.  2019/12/03 E&M) This visit was coded based on time. I spent a total of 20 minutes in both face-to-face and non-face-to-face activities for this visit on the date of this encounter.   Attestation Statement:   I personally performed the service. (TP)  LAURA LEVORN LEMMINGS, MD

## 2024-12-15 DEATH — deceased
